# Patient Record
Sex: Male | Born: 1965 | Race: Black or African American | Hispanic: No | Marital: Married | Smoking: Never smoker
Health system: Southern US, Community
[De-identification: ages and names within clinical notes are randomized; demographics above are authoritative.]

## PROBLEM LIST (undated history)

## (undated) DIAGNOSIS — I5032 Chronic diastolic (congestive) heart failure: Secondary | ICD-10-CM

## (undated) DIAGNOSIS — T8859XA Other complications of anesthesia, initial encounter: Secondary | ICD-10-CM

## (undated) DIAGNOSIS — I272 Pulmonary hypertension, unspecified: Secondary | ICD-10-CM

## (undated) DIAGNOSIS — E785 Hyperlipidemia, unspecified: Secondary | ICD-10-CM

## (undated) DIAGNOSIS — I499 Cardiac arrhythmia, unspecified: Secondary | ICD-10-CM

## (undated) DIAGNOSIS — I480 Paroxysmal atrial fibrillation: Secondary | ICD-10-CM

## (undated) DIAGNOSIS — Z955 Presence of coronary angioplasty implant and graft: Secondary | ICD-10-CM

## (undated) DIAGNOSIS — N189 Chronic kidney disease, unspecified: Secondary | ICD-10-CM

## (undated) DIAGNOSIS — N186 End stage renal disease: Secondary | ICD-10-CM

## (undated) DIAGNOSIS — I50810 Right heart failure, unspecified: Secondary | ICD-10-CM

## (undated) DIAGNOSIS — I214 Non-ST elevation (NSTEMI) myocardial infarction: Secondary | ICD-10-CM

## (undated) DIAGNOSIS — K219 Gastro-esophageal reflux disease without esophagitis: Secondary | ICD-10-CM

## (undated) DIAGNOSIS — Z940 Kidney transplant status: Secondary | ICD-10-CM

## (undated) DIAGNOSIS — I10 Essential (primary) hypertension: Secondary | ICD-10-CM

## (undated) DIAGNOSIS — I4891 Unspecified atrial fibrillation: Secondary | ICD-10-CM

## (undated) DIAGNOSIS — G4730 Sleep apnea, unspecified: Secondary | ICD-10-CM

## (undated) DIAGNOSIS — I2510 Atherosclerotic heart disease of native coronary artery without angina pectoris: Secondary | ICD-10-CM

## (undated) DIAGNOSIS — J189 Pneumonia, unspecified organism: Secondary | ICD-10-CM

## (undated) DIAGNOSIS — I509 Heart failure, unspecified: Secondary | ICD-10-CM

## (undated) HISTORY — DX: Hyperlipidemia, unspecified: E78.5

## (undated) HISTORY — PX: HUMERUS SURGERY: SHX672

## (undated) HISTORY — PX: PARATHYROIDECTOMY: SHX19

## (undated) HISTORY — DX: Pneumonia, unspecified organism: J18.9

## (undated) HISTORY — DX: Sleep apnea, unspecified: G47.30

## (undated) HISTORY — PX: SMALL BOWEL ENTEROSCOPY: SHX2415

## (undated) HISTORY — PX: CHOLECYSTECTOMY: SHX55

## (undated) HISTORY — PX: CATARACT EXTRACTION: SUR2

## (undated) HISTORY — PX: ABLATION: SHX5711

## (undated) HISTORY — PX: HERNIA MESH REMOVAL: SHX1745

## (undated) HISTORY — PX: HIATAL HERNIA REPAIR: SHX195

## (undated) HISTORY — DX: Chronic kidney disease, unspecified: N18.9

---

## 1998-11-15 DIAGNOSIS — G839 Paralytic syndrome, unspecified: Secondary | ICD-10-CM

## 1998-11-15 HISTORY — DX: Paralytic syndrome, unspecified: G83.9

## 2003-11-16 DIAGNOSIS — Z940 Kidney transplant status: Secondary | ICD-10-CM

## 2003-11-16 HISTORY — DX: Kidney transplant status: Z94.0

## 2003-11-16 HISTORY — PX: TRANSPLANTATION RENAL: SUR1385

## 2016-11-15 ENCOUNTER — Inpatient Hospital Stay (HOSPITAL_COMMUNITY): Payer: Medicare Other

## 2016-11-15 ENCOUNTER — Encounter (HOSPITAL_COMMUNITY): Payer: Self-pay

## 2016-11-15 ENCOUNTER — Inpatient Hospital Stay (HOSPITAL_COMMUNITY)
Admission: EM | Admit: 2016-11-15 | Discharge: 2016-11-25 | DRG: 299 | Disposition: A | Discharge status: Home Health | Payer: Medicare Other | Attending: Internal Medicine | Admitting: Internal Medicine

## 2016-11-15 ENCOUNTER — Inpatient Hospital Stay (HOSPITAL_COMMUNITY)
Admit: 2016-11-15 | Discharge: 2016-11-15 | Disposition: A | Discharge status: Home / Self Care | Payer: Medicare Other | Attending: Internal Medicine | Admitting: Internal Medicine

## 2016-11-15 ENCOUNTER — Emergency Department (HOSPITAL_COMMUNITY): Payer: Medicare Other

## 2016-11-15 DIAGNOSIS — Z7952 Long term (current) use of systemic steroids: Secondary | ICD-10-CM | POA: Diagnosis not present

## 2016-11-15 DIAGNOSIS — I2510 Atherosclerotic heart disease of native coronary artery without angina pectoris: Secondary | ICD-10-CM | POA: Diagnosis present

## 2016-11-15 DIAGNOSIS — I482 Chronic atrial fibrillation: Secondary | ICD-10-CM | POA: Diagnosis present

## 2016-11-15 DIAGNOSIS — N184 Chronic kidney disease, stage 4 (severe): Secondary | ICD-10-CM | POA: Diagnosis present

## 2016-11-15 DIAGNOSIS — N189 Chronic kidney disease, unspecified: Secondary | ICD-10-CM

## 2016-11-15 DIAGNOSIS — E8889 Other specified metabolic disorders: Secondary | ICD-10-CM | POA: Diagnosis present

## 2016-11-15 DIAGNOSIS — Z992 Dependence on renal dialysis: Secondary | ICD-10-CM

## 2016-11-15 DIAGNOSIS — I248 Other forms of acute ischemic heart disease: Secondary | ICD-10-CM | POA: Diagnosis present

## 2016-11-15 DIAGNOSIS — D631 Anemia in chronic kidney disease: Secondary | ICD-10-CM | POA: Diagnosis present

## 2016-11-15 DIAGNOSIS — I82532 Chronic embolism and thrombosis of left popliteal vein: Secondary | ICD-10-CM | POA: Diagnosis present

## 2016-11-15 DIAGNOSIS — R262 Difficulty in walking, not elsewhere classified: Secondary | ICD-10-CM

## 2016-11-15 DIAGNOSIS — R509 Fever, unspecified: Secondary | ICD-10-CM | POA: Diagnosis not present

## 2016-11-15 DIAGNOSIS — I4891 Unspecified atrial fibrillation: Secondary | ICD-10-CM | POA: Diagnosis not present

## 2016-11-15 DIAGNOSIS — T79A29D Traumatic compartment syndrome of unspecified lower extremity, subsequent encounter: Secondary | ICD-10-CM

## 2016-11-15 DIAGNOSIS — N289 Disorder of kidney and ureter, unspecified: Secondary | ICD-10-CM | POA: Diagnosis not present

## 2016-11-15 DIAGNOSIS — I4892 Unspecified atrial flutter: Secondary | ICD-10-CM | POA: Diagnosis present

## 2016-11-15 DIAGNOSIS — M7989 Other specified soft tissue disorders: Secondary | ICD-10-CM

## 2016-11-15 DIAGNOSIS — L0390 Cellulitis, unspecified: Secondary | ICD-10-CM

## 2016-11-15 DIAGNOSIS — D500 Iron deficiency anemia secondary to blood loss (chronic): Secondary | ICD-10-CM

## 2016-11-15 DIAGNOSIS — N179 Acute kidney failure, unspecified: Secondary | ICD-10-CM

## 2016-11-15 DIAGNOSIS — R0902 Hypoxemia: Secondary | ICD-10-CM | POA: Diagnosis present

## 2016-11-15 DIAGNOSIS — Z7901 Long term (current) use of anticoagulants: Secondary | ICD-10-CM

## 2016-11-15 DIAGNOSIS — Y848 Other medical procedures as the cause of abnormal reaction of the patient, or of later complication, without mention of misadventure at the time of the procedure: Secondary | ICD-10-CM | POA: Diagnosis present

## 2016-11-15 DIAGNOSIS — A419 Sepsis, unspecified organism: Secondary | ICD-10-CM | POA: Diagnosis present

## 2016-11-15 DIAGNOSIS — L03116 Cellulitis of left lower limb: Secondary | ICD-10-CM | POA: Diagnosis present

## 2016-11-15 DIAGNOSIS — I5033 Acute on chronic diastolic (congestive) heart failure: Secondary | ICD-10-CM | POA: Diagnosis present

## 2016-11-15 DIAGNOSIS — E785 Hyperlipidemia, unspecified: Secondary | ICD-10-CM | POA: Diagnosis present

## 2016-11-15 DIAGNOSIS — N170 Acute kidney failure with tubular necrosis: Secondary | ICD-10-CM | POA: Diagnosis present

## 2016-11-15 DIAGNOSIS — Z881 Allergy status to other antibiotic agents status: Secondary | ICD-10-CM

## 2016-11-15 DIAGNOSIS — M79604 Pain in right leg: Secondary | ICD-10-CM

## 2016-11-15 DIAGNOSIS — T8619 Other complication of kidney transplant: Secondary | ICD-10-CM | POA: Diagnosis present

## 2016-11-15 DIAGNOSIS — N2581 Secondary hyperparathyroidism of renal origin: Secondary | ICD-10-CM | POA: Diagnosis present

## 2016-11-15 DIAGNOSIS — R609 Edema, unspecified: Secondary | ICD-10-CM

## 2016-11-15 DIAGNOSIS — S8012XA Contusion of left lower leg, initial encounter: Secondary | ICD-10-CM

## 2016-11-15 DIAGNOSIS — T79A0XA Compartment syndrome, unspecified, initial encounter: Secondary | ICD-10-CM

## 2016-11-15 DIAGNOSIS — Z949 Transplanted organ and tissue status, unspecified: Secondary | ICD-10-CM

## 2016-11-15 DIAGNOSIS — M79672 Pain in left foot: Secondary | ICD-10-CM

## 2016-11-15 DIAGNOSIS — I480 Paroxysmal atrial fibrillation: Secondary | ICD-10-CM

## 2016-11-15 DIAGNOSIS — I10 Essential (primary) hypertension: Secondary | ICD-10-CM | POA: Diagnosis not present

## 2016-11-15 DIAGNOSIS — I130 Hypertensive heart and chronic kidney disease with heart failure and stage 1 through stage 4 chronic kidney disease, or unspecified chronic kidney disease: Secondary | ICD-10-CM | POA: Diagnosis present

## 2016-11-15 DIAGNOSIS — Z79899 Other long term (current) drug therapy: Secondary | ICD-10-CM

## 2016-11-15 DIAGNOSIS — S8012XD Contusion of left lower leg, subsequent encounter: Secondary | ICD-10-CM | POA: Diagnosis not present

## 2016-11-15 DIAGNOSIS — M660 Rupture of popliteal cyst: Secondary | ICD-10-CM | POA: Diagnosis present

## 2016-11-15 DIAGNOSIS — D649 Anemia, unspecified: Secondary | ICD-10-CM

## 2016-11-15 HISTORY — DX: Atherosclerotic heart disease of native coronary artery without angina pectoris: I25.10

## 2016-11-15 HISTORY — DX: Essential (primary) hypertension: I10

## 2016-11-15 HISTORY — DX: Kidney transplant status: Z94.0

## 2016-11-15 HISTORY — DX: Heart failure, unspecified: I50.9

## 2016-11-15 HISTORY — DX: Unspecified atrial fibrillation: I48.91

## 2016-11-15 LAB — COMPREHENSIVE METABOLIC PANEL
ALBUMIN: 3.1 g/dL — AB (ref 3.5–5.0)
ALK PHOS: 93 U/L (ref 38–126)
ALT: 19 U/L (ref 17–63)
ALT: 21 U/L (ref 17–63)
ANION GAP: 7 (ref 5–15)
ANION GAP: 7 (ref 5–15)
AST: 36 U/L (ref 15–41)
AST: 38 U/L (ref 15–41)
Albumin: 3.4 g/dL — ABNORMAL LOW (ref 3.5–5.0)
Alkaline Phosphatase: 98 U/L (ref 38–126)
BILIRUBIN TOTAL: 2.5 mg/dL — AB (ref 0.3–1.2)
BILIRUBIN TOTAL: 1.8 mg/dL — AB (ref 0.3–1.2)
BUN: 23 mg/dL — ABNORMAL HIGH (ref 6–20)
BUN: 21 mg/dL — ABNORMAL HIGH (ref 6–20)
CALCIUM: 9.1 mg/dL (ref 8.9–10.3)
CALCIUM: 8.3 mg/dL — AB (ref 8.9–10.3)
CO2: 25 mmol/L (ref 22–32)
CO2: 22 mmol/L (ref 22–32)
CREATININE: 2.48 mg/dL — AB (ref 0.61–1.24)
Chloride: 106 mmol/L (ref 101–111)
Chloride: 107 mmol/L (ref 101–111)
Creatinine, Ser: 2.31 mg/dL — ABNORMAL HIGH (ref 0.61–1.24)
GFR calc non Af Amer: 29 mL/min — ABNORMAL LOW (ref >60)
GFR calc non Af Amer: 32 mL/min — ABNORMAL LOW (ref >60)
GFR, EST AFRICAN AMERICAN: 34 mL/min — AB (ref >60)
GFR, EST AFRICAN AMERICAN: 37 mL/min — AB (ref >60)
GLUCOSE: 118 mg/dL — AB (ref 65–99)
Glucose, Bld: 99 mg/dL (ref 65–99)
POTASSIUM: 4.2 mmol/L (ref 3.5–5.1)
Potassium: 4.1 mmol/L (ref 3.5–5.1)
SODIUM: 138 mmol/L (ref 135–145)
Sodium: 136 mmol/L (ref 135–145)
TOTAL PROTEIN: 6.9 g/dL (ref 6.5–8.1)
TOTAL PROTEIN: 6.4 g/dL — AB (ref 6.5–8.1)

## 2016-11-15 LAB — CBC
HEMATOCRIT: 33.7 % — AB (ref 39.0–52.0)
HEMOGLOBIN: 10.2 g/dL — AB (ref 13.0–17.0)
MCH: 27.8 pg (ref 26.0–34.0)
MCHC: 30.3 g/dL (ref 30.0–36.0)
MCV: 91.8 fL (ref 78.0–100.0)
Platelets: 188 10*3/uL (ref 150–400)
RBC: 3.67 MIL/uL — ABNORMAL LOW (ref 4.22–5.81)
RDW: 16.8 % — ABNORMAL HIGH (ref 11.5–15.5)
WBC: 12.3 10*3/uL — ABNORMAL HIGH (ref 4.0–10.5)

## 2016-11-15 LAB — CBC WITH DIFFERENTIAL/PLATELET
BASOS ABS: 0 10*3/uL (ref 0.0–0.1)
BASOS PCT: 0 %
EOS ABS: 0.4 10*3/uL (ref 0.0–0.7)
Eosinophils Relative: 3 %
HEMATOCRIT: 38 % — AB (ref 39.0–52.0)
HEMOGLOBIN: 11.3 g/dL — AB (ref 13.0–17.0)
Lymphocytes Relative: 13 %
Lymphs Abs: 1.7 10*3/uL (ref 0.7–4.0)
MCH: 27.3 pg (ref 26.0–34.0)
MCHC: 29.7 g/dL — AB (ref 30.0–36.0)
MCV: 91.8 fL (ref 78.0–100.0)
Monocytes Absolute: 1.7 10*3/uL — ABNORMAL HIGH (ref 0.1–1.0)
Monocytes Relative: 13 %
NEUTROS ABS: 9.3 10*3/uL — AB (ref 1.7–7.7)
NEUTROS PCT: 71 %
Platelets: 205 10*3/uL (ref 150–400)
RBC: 4.14 MIL/uL — ABNORMAL LOW (ref 4.22–5.81)
RDW: 16.7 % — AB (ref 11.5–15.5)
WBC: 13.1 10*3/uL — ABNORMAL HIGH (ref 4.0–10.5)

## 2016-11-15 LAB — PROTIME-INR
INR: 2.37
INR: 2.12
PROTHROMBIN TIME: 26.3 s — AB (ref 11.4–15.2)
PROTHROMBIN TIME: 24.1 s — AB (ref 11.4–15.2)

## 2016-11-15 LAB — URINALYSIS, ROUTINE W REFLEX MICROSCOPIC
BILIRUBIN URINE: NEGATIVE
Glucose, UA: NEGATIVE mg/dL
Ketones, ur: NEGATIVE mg/dL
Leukocytes, UA: NEGATIVE
NITRITE: NEGATIVE
PROTEIN: 30 mg/dL — AB
SPECIFIC GRAVITY, URINE: 1.015 (ref 1.005–1.030)
pH: 8 (ref 5.0–8.0)

## 2016-11-15 LAB — TSH
TSH: 1.184 u[IU]/mL (ref 0.350–4.500)
TSH: 0.871 u[IU]/mL (ref 0.350–4.500)

## 2016-11-15 LAB — TROPONIN I
Troponin I: 0.09 ng/mL (ref <0.03)
Troponin I: 0.09 ng/mL (ref <0.03)

## 2016-11-15 LAB — INFLUENZA PANEL BY PCR (TYPE A & B)
INFLAPCR: NEGATIVE
Influenza B By PCR: NEGATIVE

## 2016-11-15 LAB — I-STAT CG4 LACTIC ACID, ED: Lactic Acid, Venous: 1.76 mmol/L (ref 0.5–1.9)

## 2016-11-15 LAB — MRSA PCR SCREENING: MRSA BY PCR: NEGATIVE

## 2016-11-15 MED ORDER — AMIODARONE HCL IN DEXTROSE 360-4.14 MG/200ML-% IV SOLN
30.0000 mg/h | INTRAVENOUS | Status: DC
  Filled 2016-11-15: qty 200

## 2016-11-15 MED ORDER — DOXYCYCLINE HYCLATE 100 MG IV SOLR
INTRAVENOUS | Status: AC
  Filled 2016-11-15: qty 100

## 2016-11-15 MED ORDER — METOPROLOL SUCCINATE ER 50 MG PO TB24
50.0000 mg | ORAL_TABLET | Freq: Every day | ORAL | Status: DC
  Administered 2016-11-15: 50 mg via ORAL
  Filled 2016-11-15 (×4): qty 1

## 2016-11-15 MED ORDER — DOXYCYCLINE HYCLATE 100 MG IV SOLR
100.0000 mg | Freq: Two times a day (BID) | INTRAVENOUS | Status: DC
  Administered 2016-11-15 – 2016-11-19 (×8): 100 mg via INTRAVENOUS
  Filled 2016-11-15 (×14): qty 100

## 2016-11-15 MED ORDER — TAMSULOSIN HCL 0.4 MG PO CAPS
0.4000 mg | ORAL_CAPSULE | Freq: Every day | ORAL | Status: DC
  Administered 2016-11-16 – 2016-11-25 (×10): 0.4 mg via ORAL
  Filled 2016-11-15 (×11): qty 1

## 2016-11-15 MED ORDER — HEPARIN (PORCINE) IN NACL 100-0.45 UNIT/ML-% IJ SOLN
2000.0000 [IU]/h | INTRAMUSCULAR | Status: DC
Start: 2016-11-15 — End: 2016-11-20
  Administered 2016-11-15: 1600 [IU]/h via INTRAVENOUS
  Administered 2016-11-16: 1850 [IU]/h via INTRAVENOUS
  Administered 2016-11-17: 2000 [IU]/h via INTRAVENOUS
  Administered 2016-11-17: 2200 [IU]/h via INTRAVENOUS
  Administered 2016-11-18 – 2016-11-20 (×4): 2000 [IU]/h via INTRAVENOUS
  Filled 2016-11-15 (×9): qty 250

## 2016-11-15 MED ORDER — ACETAMINOPHEN 500 MG PO TABS
500.0000 mg | ORAL_TABLET | Freq: Once | ORAL | Status: AC
  Administered 2016-11-15: 500 mg via ORAL
  Filled 2016-11-15: qty 1

## 2016-11-15 MED ORDER — FUROSEMIDE 40 MG PO TABS
40.0000 mg | ORAL_TABLET | Freq: Every day | ORAL | Status: DC
Start: 2016-11-15 — End: 2016-11-21
  Administered 2016-11-16 – 2016-11-20 (×5): 40 mg via ORAL
  Filled 2016-11-15 (×7): qty 1

## 2016-11-15 MED ORDER — MORPHINE SULFATE (PF) 4 MG/ML IV SOLN
4.0000 mg | Freq: Once | INTRAVENOUS | Status: AC
  Administered 2016-11-15: 4 mg via INTRAVENOUS
  Filled 2016-11-15: qty 1

## 2016-11-15 MED ORDER — ACETAMINOPHEN 325 MG PO TABS
650.0000 mg | ORAL_TABLET | Freq: Four times a day (QID) | ORAL | Status: DC | PRN
  Administered 2016-11-15 – 2016-11-25 (×16): 650 mg via ORAL
  Filled 2016-11-15 (×17): qty 2

## 2016-11-15 MED ORDER — DILTIAZEM HCL 25 MG/5ML IV SOLN
10.0000 mg | Freq: Once | INTRAVENOUS | Status: AC
  Administered 2016-11-15: 10 mg via INTRAVENOUS
  Filled 2016-11-15: qty 5

## 2016-11-15 MED ORDER — PANTOPRAZOLE SODIUM 40 MG PO TBEC
40.0000 mg | DELAYED_RELEASE_TABLET | Freq: Every day | ORAL | Status: DC
Start: 2016-11-15 — End: 2016-11-25
  Administered 2016-11-15 – 2016-11-25 (×11): 40 mg via ORAL
  Filled 2016-11-15 (×11): qty 1

## 2016-11-15 MED ORDER — SODIUM CHLORIDE 0.9 % IV BOLUS (SEPSIS)
1000.0000 mL | Freq: Once | INTRAVENOUS | Status: AC
  Administered 2016-11-15: 1000 mL via INTRAVENOUS

## 2016-11-15 MED ORDER — ACETAMINOPHEN 500 MG PO TABS: 1000.0000 mg | ORAL_TABLET | Freq: Once | ORAL | Status: DC

## 2016-11-15 MED ORDER — DOXYCYCLINE HYCLATE 100 MG IV SOLR
100.0000 mg | Freq: Once | INTRAVENOUS | Status: AC
  Administered 2016-11-15: 100 mg via INTRAVENOUS
  Filled 2016-11-15: qty 100

## 2016-11-15 MED ORDER — PIPERACILLIN-TAZOBACTAM 3.375 G IVPB
3.3750 g | Freq: Three times a day (TID) | INTRAVENOUS | Status: DC
  Administered 2016-11-15 – 2016-11-20 (×15): 3.375 g via INTRAVENOUS
  Filled 2016-11-15 (×15): qty 50

## 2016-11-15 MED ORDER — TACROLIMUS 1 MG PO CAPS
1.0000 mg | ORAL_CAPSULE | Freq: Two times a day (BID) | ORAL | Status: DC
  Administered 2016-11-15 – 2016-11-25 (×21): 1 mg via ORAL
  Filled 2016-11-15 (×24): qty 1

## 2016-11-15 MED ORDER — FERROUS SULFATE 325 (65 FE) MG PO TABS
325.0000 mg | ORAL_TABLET | Freq: Every day | ORAL | Status: DC
  Administered 2016-11-16 – 2016-11-21 (×6): 325 mg via ORAL
  Filled 2016-11-15 (×6): qty 1

## 2016-11-15 MED ORDER — VITAMIN K1 10 MG/ML IJ SOLN
2.0000 mg | Freq: Once | INTRAVENOUS | Status: AC
  Administered 2016-11-15: 2 mg via INTRAVENOUS
  Filled 2016-11-15: qty 0.2

## 2016-11-15 MED ORDER — WARFARIN SODIUM 5 MG PO TABS: 7.0000 mg | ORAL_TABLET | Freq: Once | ORAL | Status: DC

## 2016-11-15 MED ORDER — MAGNESIUM OXIDE 400 MG PO TABS
200.0000 mg | ORAL_TABLET | Freq: Every day | ORAL | Status: DC
  Administered 2016-11-15 – 2016-11-25 (×11): 200 mg via ORAL
  Filled 2016-11-15: qty 0.5
  Filled 2016-11-15 (×2): qty 1
  Filled 2016-11-15: qty 0.5
  Filled 2016-11-15 (×3): qty 1
  Filled 2016-11-15 (×3): qty 0.5
  Filled 2016-11-15: qty 1
  Filled 2016-11-15 (×3): qty 0.5
  Filled 2016-11-15 (×2): qty 1
  Filled 2016-11-15: qty 0.5
  Filled 2016-11-15 (×2): qty 1
  Filled 2016-11-15 (×3): qty 0.5

## 2016-11-15 MED ORDER — METOPROLOL SUCCINATE ER 25 MG PO TB24: 25.0000 mg | ORAL_TABLET | Freq: Every day | ORAL | Status: DC

## 2016-11-15 MED ORDER — ADULT MULTIVITAMIN W/MINERALS CH
1.0000 | ORAL_TABLET | Freq: Every day | ORAL | Status: DC
  Administered 2016-11-15 – 2016-11-25 (×11): 1 via ORAL
  Filled 2016-11-15 (×11): qty 1

## 2016-11-15 MED ORDER — AMIODARONE HCL IN DEXTROSE 360-4.14 MG/200ML-% IV SOLN
60.0000 mg/h | INTRAVENOUS | Status: AC
  Administered 2016-11-15 (×2): 60 mg/h via INTRAVENOUS
  Filled 2016-11-15: qty 200

## 2016-11-15 MED ORDER — ASPIRIN 81 MG PO CHEW
81.0000 mg | CHEWABLE_TABLET | Freq: Every day | ORAL | Status: DC
  Administered 2016-11-15 – 2016-11-25 (×11): 81 mg via ORAL
  Filled 2016-11-15 (×11): qty 1

## 2016-11-15 MED ORDER — WARFARIN - PHARMACIST DOSING INPATIENT: Status: DC

## 2016-11-15 MED ORDER — ACETAMINOPHEN 500 MG PO TABS
1000.0000 mg | ORAL_TABLET | Freq: Once | ORAL | Status: AC
  Administered 2016-11-15: 1000 mg via ORAL
  Filled 2016-11-15: qty 2

## 2016-11-15 MED ORDER — MULTI-VITAMIN/MINERALS PO TABS
1.0000 | ORAL_TABLET | Freq: Every day | ORAL | Status: DC
  Filled 2016-11-15 (×3): qty 1

## 2016-11-15 MED ORDER — HYDROCODONE-ACETAMINOPHEN 5-325 MG PO TABS
1.0000 | ORAL_TABLET | Freq: Four times a day (QID) | ORAL | Status: DC | PRN
  Administered 2016-11-15 – 2016-11-19 (×9): 1 via ORAL
  Filled 2016-11-15 (×9): qty 1

## 2016-11-15 MED ORDER — CALCITRIOL 0.25 MCG PO CAPS
0.5000 ug | ORAL_CAPSULE | Freq: Every day | ORAL | Status: DC
  Administered 2016-11-15 – 2016-11-25 (×11): 0.5 ug via ORAL
  Filled 2016-11-15 (×10): qty 2

## 2016-11-15 MED ORDER — PIPERACILLIN-TAZOBACTAM 3.375 G IVPB
3.3750 g | Freq: Once | INTRAVENOUS | Status: AC
  Administered 2016-11-15: 3.375 g via INTRAVENOUS
  Filled 2016-11-15: qty 50

## 2016-11-15 MED ORDER — AMIODARONE LOAD VIA INFUSION
150.0000 mg | Freq: Once | INTRAVENOUS | Status: AC
  Administered 2016-11-15: 150 mg via INTRAVENOUS
  Filled 2016-11-15: qty 83.34

## 2016-11-15 MED ORDER — PREDNISONE 10 MG PO TABS
10.0000 mg | ORAL_TABLET | Freq: Every day | ORAL | Status: DC
  Administered 2016-11-16 – 2016-11-25 (×10): 10 mg via ORAL
  Filled 2016-11-15 (×10): qty 1

## 2016-11-15 MED ORDER — AMIODARONE HCL IN DEXTROSE 360-4.14 MG/200ML-% IV SOLN
INTRAVENOUS | Status: AC
  Filled 2016-11-15: qty 200

## 2016-11-15 MED ORDER — DILTIAZEM HCL 100 MG IV SOLR
5.0000 mg/h | INTRAVENOUS | Status: DC
  Administered 2016-11-15: 5 mg/h via INTRAVENOUS
  Administered 2016-11-15: 15 mg/h via INTRAVENOUS
  Administered 2016-11-16: 10 mg/h via INTRAVENOUS
  Filled 2016-11-15 (×5): qty 100

## 2016-11-15 MED ORDER — ATORVASTATIN CALCIUM 40 MG PO TABS
80.0000 mg | ORAL_TABLET | Freq: Every day | ORAL | Status: DC
  Administered 2016-11-15 – 2016-11-24 (×11): 80 mg via ORAL
  Filled 2016-11-15 (×13): qty 2

## 2016-11-15 NOTE — ED Notes (Signed)
Pt in [place] upon receiving report, awaiting return to assess pt.

## 2016-11-15 NOTE — ED Triage Notes (Signed)
Pt reports chills and fever x 1 day, is taking hydrocodone for his right calf that is swollen and has been seen by his pmd to rule out blood clot.

## 2016-11-15 NOTE — Progress Notes (Addendum)
Pharmacy Antibiotic Note  [name] is a 51 y.o. male admitted on 11/15/2016 with deep tissue abscess.  Pharmacy has been consulted for ZOSYN dosing.  Pt noted to have allergy to Vancomycin.  Also asked to initiate Heparin for VTE treatment, no bolus.  Pt did receive Vitamin K 2mg  in ED.  INR therapeutic on admission.  Plan: Zosyn 3.375g IV q8h (4 hour infusion).  Monitor progress, labs, c/s Heparin infusion at 1600 units/hr (no bolus) Heparin level daily  Height: 6\' 2"  (188 cm) Weight: 275 lb (124.7 kg) IBW/kg (Calculated) : 82.2  Temp (24hrs), Avg:100.2 F (37.9 C), Min:99.1 F (37.3 C), Max:101.5 F (38.6 C)   Recent Labs Lab 11/15/16 0205 11/15/16 0343 11/15/16 1526  WBC 13.1*  --  12.3*  CREATININE 2.48*  --   --   LATICACIDVEN  --  1.76  --    INR Last Three Days: Recent Labs     11/15/16  0205  INR  2.37   Estimated Creatinine Clearance: 51.1 mL/min (by C-G formula based on SCr of 2.48 mg/dL (H)).    Allergies  Allergen Reactions  . Vancomycin Swelling   Antimicrobials this admission: Zosyn 1/1 >>   Dose adjustments this admission:  Microbiology results:  BCx: pending  UCx: pending   Sputum:    MRSA PCR:   Thank you for allowing pharmacy to be a part of this patient's care.  Hart Robinsons A 11/15/2016 4:03 PM

## 2016-11-15 NOTE — ED Notes (Signed)
Left calf 45cm.

## 2016-11-15 NOTE — ED Notes (Signed)
Pt transported to US

## 2016-11-15 NOTE — ED Notes (Signed)
RN called pharmacy to request toprol xl. 2nd call.

## 2016-11-15 NOTE — ED Provider Notes (Addendum)
CHIEF COMPLAINT: Fever, urinary frequency, left leg pain and swelling  HPI: Pt is a 51 y.o. male with history of hypertension, CAD, kidney transplant on prednisone and Prograf, atrial fibrillation on Coumadin who presents to the emergency department with complaints of fever that started today. Fever at home was 102. States he has had urosepsis many times in the past.  States he has previously been admitted to hospitals in [place]. Denies any headache, neck pain or neck stiffness, chest pain or shortness of breath, cough, vomiting or diarrhea. Has noticed that he has been urinating more today than normal but denies dysuria or hematuria. States that last week he noticed pain and swelling to his left lower extremity. Was at [facility] and had a venous ultrasound that ruled out DVT but showed that he had a hematoma. States this area continues to be very painful to him and the skin is discolored. Denies any injury to this area. States he did have a flu shot this year. Reports his kidney function is normally around 2.7.  ROS: See HPI Constitutional:  fever  Eyes: no drainage  ENT: no runny nose   Cardiovascular:  no chest pain  Resp: no SOB  GI: no vomiting GU: no dysuria Integumentary: no rash  Allergy: no hives  Musculoskeletal: no leg swelling  Neurological: no slurred speech ROS otherwise negative  PAST MEDICAL HISTORY/PAST SURGICAL HISTORY:  Past Medical History:  Diagnosis Date  . CHF (congestive heart failure) ([place])   . Coronary artery disease   . Hypertension   . Kidney transplanted     MEDICATIONS:  Prior to Admission medications   Medication Sig Start Date End Date Taking? Authorizing Provider  atorvastatin (LIPITOR) 80 MG tablet Take 80 mg by mouth daily.   Yes Historical Provider, MD  calcitRIOL (ROCALTROL) 0.5 MCG capsule Take 0.5 mcg by mouth daily.   Yes Historical Provider, MD  calcium carbonate 1250 MG capsule Take 1,800 mg by mouth 2  (two) times daily with a meal.   Yes Historical Provider, MD  ferrous sulfate 325 (65 FE) MG tablet Take 325 mg by mouth daily with breakfast.   Yes Historical Provider, MD  furosemide (LASIX) 40 MG tablet Take 40 mg by mouth.   Yes Historical Provider, MD  magnesium oxide (MAG-OX) 400 MG tablet Take 200 mg by mouth daily.   Yes Historical Provider, MD  metoprolol succinate (TOPROL-XL) 25 MG 24 hr tablet Take 25 mg by mouth daily.   Yes Historical Provider, MD  Multiple Vitamins-Minerals (MULTIVITAMIN WITH MINERALS) tablet Take 1 tablet by mouth daily.   Yes Historical Provider, MD  omeprazole (PRILOSEC) 40 MG capsule Take 40 mg by mouth daily.   Yes Historical Provider, MD  predniSONE (DELTASONE) 10 MG tablet Take 10 mg by mouth daily with breakfast.   Yes Historical Provider, MD  tacrolimus (PROGRAF) 1 MG capsule Take 1 mg by mouth 2 (two) times daily.   Yes Historical Provider, MD  tamsulosin (FLOMAX) 0.4 MG CAPS capsule Take 0.4 mg by mouth.   Yes Historical Provider, MD  warfarin (COUMADIN) 6 MG tablet Take 6 mg by mouth daily. 6 mg Sat/Sun,  7 mg Mon-Friday   Yes Historical Provider, MD    ALLERGIES:  Allergies  Allergen Reactions  . Vancomycin Swelling    SOCIAL HISTORY:  Social History  Substance Use Topics  . Smoking status: Never Smoker  . Smokeless tobacco: Never Used  . Alcohol use No    FAMILY HISTORY: No  family history on file.  EXAM: BP 144/71 (BP Location: Right Arm)   Pulse (!) 137   Temp 101.5 F (38.6 C) (Rectal)   Resp 20   Ht 6\' 2"  (1.88 m)   Wt 275 lb (124.7 kg)   SpO2 97%   BMI 35.31 kg/m  CONSTITUTIONAL: Alert and oriented and responds appropriately to questions. Well-appearing; well-nourished, Afebrile HEAD: Normocephalic EYES: Conjunctivae clear, PERRL, EOMI ENT: normal nose; no rhinorrhea; moist mucous membranes NECK: Supple, no meningismus, no nuchal rigidity, no LAD  CARD: Irregularly irregular and tachycardic; S1 and S2 appreciated; no  murmurs, no clicks, no rubs, no gallops RESP: Normal chest excursion without splinting or tachypnea; breath sounds clear and equal bilaterally; no wheezes, no rhonchi, no rales, no hypoxia or respiratory distress, speaking full sentences ABD/GI: Normal bowel sounds; non-distended; soft, non-tender, no rebound, no guarding, no peritoneal signs, no hepatosplenomegaly BACK:  The back appears normal and is non-tender to palpation, there is no CVA tenderness EXT: Patient's left calf is swollen, tender to palpation with discoloration of the skin but no warmth. Compartments are soft. 2+ DP pulses bilaterally. No bony deformity. No joint effusion. Normal ROM in all joints; otherwise extremities are non-tender to palpation; no edema; normal capillary refill; no cyanosis, no calf tenderness or swelling    SKIN: Normal color for age and race; warm; no rash NEURO: Moves all extremities equally, sensation to light touch intact diffusely, cranial nerves II through XII intact, normal speech PSYCH: The patient's mood and manner are appropriate. Grooming and personal hygiene are appropriate.  MEDICAL DECISION MAKING: Patient here with fever, tachycardia. Concern for sepsis in an immunocompromised patient. He is hemodynamically stable however. Will check labs, cultures, urine. He is not having any upper respiratory symptoms. No chest pain or shortness of breath.  I do not want to start diltiazem drip at this time as he needs SIRS criteria. We'll try to treat his tachycardia with IV fluids, Tylenol.  ED PROGRESS: Patient's urine shows blood and rare bacteria but no obvious sign of infection. Chest x-ray and flu swab have been added on and are negative. Lactate normal. He is still normotensive but tachycardic. He has received his 30 mL/kg of IV fluids. Discussed with pharmacy who recommends getting patient IV Zosyn and IV doxycycline given he is allergic to vancomycin.  Outside records from [facility]  confirm that patient has a hematoma to the left leg and no DVT. This does not appear to be obvious cellulitis but could be the source of his infection. We'll discuss with medicine for admission.   5:00 AM  D/w Dr. Maudie Mercury With hospitalist service. He agrees on admission to inpatient, stepdown. Patient updated with plan.  Given no significant improvement in heart rate after IV hydration and improvement of fever, will give diltiazem bolus. Patient may need diltiazem drip while hospitalized.   EKG Interpretation  Date/Time:  Monday November 15 2016 03:26:35 EST Ventricular Rate:  139 PR Interval:    QRS Duration: 82 QT Interval:  317 QTC Calculation: 482 R Axis:   138 Text Interpretation:  Sinus or ectopic atrial tachycardia Multiform ventricular premature complexes Right axis deviation Probable anterolateral infarct, old No old tracing to compare Confirmed by Ethelreda Sukhu,  DO, Xuan Mateus ([id number]) on 11/15/2016 3:35:19 AM         CRITICAL CARE Performed by: Nyra Jabs   Total critical care time: 45 minutes  Critical care time was exclusive of separately billable procedures and treating other patients.  Critical care was necessary to treat or prevent imminent or life-threatening deterioration.  Critical care was time spent personally by me on the following activities: development of treatment plan with patient and/or surrogate as well as nursing, discussions with consultants, evaluation of patient's response to treatment, examination of patient, obtaining history from patient or surrogate, ordering and performing treatments and interventions, ordering and review of laboratory studies, ordering and review of radiographic studies, pulse oximetry and re-evaluation of patient's condition.    [place], DO 11/15/16 [place], DO 11/15/16 [id number]

## 2016-11-15 NOTE — ED Notes (Addendum)
Heartrate 130's. Caredizem at max dose of 15mg /hr, bp 110/72. Toprol Xl given, delay in receiving medication from pharmacy. Hospitalist notified.

## 2016-11-15 NOTE — ED Notes (Signed)
RN called pharmacy for daily medications (prograf, toprol). Currently holding for stepdown room with unknown wait time.
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
## 2016-11-15 NOTE — Progress Notes (Signed)
1615: Dr. Candiss Norse made aware troponin 0.09. New order for ASA received. Given as ordered.  1733: HR noted to be sustained 130-140. New order received to start amiodarone.  Per MD okay to give additional dose of ordered PRN tylenol for temperature 100.7.

## 2016-11-15 NOTE — Progress Notes (Signed)
Calf circumference 47 cm at 1800

## 2016-11-15 NOTE — H&P (Addendum)
TRH H&P   Patient Demographics:    [name], is a 51 y.o. male  MRN: [id number]   DOB - 1966/04/19  Admit Date - 11/15/2016  Outpatient Primary MD for the patient is No PCP Per Patient,  Pcp: Dr. Manuella Ghazi, [facility] , [place])   Referring MD/NP/PA: Dr. Leonides Schanz  Outpatient Specialists:     Patient coming from: home  Chief Complaint  Patient presents with  . Fever      HPI:    [name]  is a 51 y.o. male, w Atrial fibrillation , Hypertension, CHF ? EF, CAD, renal transplant who presented with c/o fever x1 day,  Pt notes that his left leg been swollen x4 days.  Pt had evaluation in ED, at [facility].  Per pt no blood clot.  Pt sent home.  Presents today due to fever.  Denies sore throat, ear ache, cough, cp, palp, sob.   In ED, pt felt to have cellulitis by ED of the left distal lower ext.  CXR negative.  Urinalysis negative.  Source of fever appears to be cellulitis.  Pt will be admitted for Afib with RVR and cellulitis.     Review of systems:    In addition to the HPI above,   No Headache, No changes with Vision or hearing, No problems swallowing food or Liquids, No Chest pain, Cough or Shortness of Breath, No Abdominal pain, No Nausea or Vommitting, Bowel movements are regular, No Blood in stool or Urine, No dysuria,  No new joints pains-aches,  No new weakness, tingling, numbness in any extremity, No recent weight gain or loss, No polyuria, polydypsia or polyphagia, No significant Mental Stressors.  A full 10 point Review of Systems was done, except as stated above, all other Review of Systems were negative.   With Past History of the following :    Past Medical History:  Diagnosis Date  . Atrial fibrillation ([place])   . CHF (congestive heart failure) ([place])   . Coronary artery disease   . Hypertension   . Kidney transplanted       Past Surgical  History:  Procedure Laterality Date  . ABLATION    . CHOLECYSTECTOMY    . TRANSPLANTATION RENAL  2005      Social History:     Social History  Substance Use Topics  . Smoking status: Never Smoker  . Smokeless tobacco: Never Used  . Alcohol use No     Lives - at home  Mobility -  Able to walk by self typically.    Family History :     Family History  Problem Relation Age of Onset  . Hypertension Mother   . Heart attack Father       Home Medications:   Prior to Admission medications   Medication Sig Start Date End Date Taking? Authorizing Provider  atorvastatin (LIPITOR) 80 MG tablet Take  80 mg by mouth daily.   Yes Historical Provider, MD  calcitRIOL (ROCALTROL) 0.5 MCG capsule Take 0.5 mcg by mouth daily.   Yes Historical Provider, MD  calcium carbonate 1250 MG capsule Take 1,800 mg by mouth 2 (two) times daily with a meal.   Yes Historical Provider, MD  ferrous sulfate 325 (65 FE) MG tablet Take 325 mg by mouth daily with breakfast.   Yes Historical Provider, MD  furosemide (LASIX) 40 MG tablet Take 40 mg by mouth.   Yes Historical Provider, MD  magnesium oxide (MAG-OX) 400 MG tablet Take 200 mg by mouth daily.   Yes Historical Provider, MD  metoprolol succinate (TOPROL-XL) 25 MG 24 hr tablet Take 25 mg by mouth daily.   Yes Historical Provider, MD  Multiple Vitamins-Minerals (MULTIVITAMIN WITH MINERALS) tablet Take 1 tablet by mouth daily.   Yes Historical Provider, MD  omeprazole (PRILOSEC) 40 MG capsule Take 40 mg by mouth daily.   Yes Historical Provider, MD  predniSONE (DELTASONE) 10 MG tablet Take 10 mg by mouth daily with breakfast.   Yes Historical Provider, MD  tacrolimus (PROGRAF) 1 MG capsule Take 1 mg by mouth 2 (two) times daily.   Yes Historical Provider, MD  tamsulosin (FLOMAX) 0.4 MG CAPS capsule Take 0.4 mg by mouth.   Yes Historical Provider, MD  warfarin (COUMADIN) 6 MG tablet Take 6 mg by mouth daily. 6 mg Sat/Sun,  7 mg Mon-Friday   Yes  Historical Provider, MD     Allergies:     Allergies  Allergen Reactions  . Vancomycin Swelling     Physical Exam:   Vitals  Blood pressure 120/75, pulse (!) 141, temperature 101.5 F (38.6 C), temperature source Rectal, resp. rate 20, height 6\' 2"  (1.88 m), weight 124.7 kg (275 lb), SpO2 92 %.   1. General  lying in bed in NAD,    2. Normal affect and insight, Not Suicidal or Homicidal, Awake Alert, Oriented X 3.  3. No F.N deficits, ALL C.Nerves Intact, Strength 5/5 all 4 extremities, Sensation intact all 4 extremities, Plantars down going.  4. Ears and Eyes appear Normal, Conjunctivae clear, PERRLA. Moist Oral Mucosa.  5. Supple Neck, No JVD, No cervical lymphadenopathy appriciated, No Carotid Bruits.  6. Symmetrical Chest wall movement, Good air movement bilaterally, CTAB.  7. Irr, irr, s1, s2 , No Gallops, Rubs or Murmurs, No Parasternal Heave.  8. Positive Bowel Sounds, Abdomen Soft, No tenderness, No organomegaly appriciated,No rebound -guarding or rigidity.  9.  No Cyanosis, Normal Skin Turgor, No Skin Rash or Bruise.  10. Good muscle tone,  joints appear normal , no effusions, Normal ROM.  11. No Palpable Lymph Nodes in Neck or Axillae + edema left distal lower ext,  + warmth, slight redness.    Data Review:    CBC  Recent Labs Lab 11/15/16 0205  WBC 13.1*  HGB 11.3*  HCT 38.0*  PLT 205  MCV 91.8  MCH 27.3  MCHC 29.7*  RDW 16.7*  LYMPHSABS 1.7  MONOABS 1.7*  EOSABS 0.4  BASOSABS 0.0   ------------------------------------------------------------------------------------------------------------------  Chemistries   Recent Labs Lab 11/15/16 0205  NA 138  K 4.1  CL 106  CO2 25  GLUCOSE 99  BUN 23*  CREATININE 2.48*  CALCIUM 9.1  AST 36  ALT 19  ALKPHOS 93  BILITOT 2.5*   ------------------------------------------------------------------------------------------------------------------ estimated creatinine clearance is 51.1 mL/min  (by C-G formula based on SCr of 2.48 mg/dL (H)). ------------------------------------------------------------------------------------------------------------------ No results for input(s): TSH,  T4TOTAL, T3FREE, THYROIDAB in the last 72 hours.  Invalid input(s): FREET3  Coagulation profile  Recent Labs Lab 11/15/16 0205  INR 2.37   ------------------------------------------------------------------------------------------------------------------- No results for input(s): DDIMER in the last 72 hours. -------------------------------------------------------------------------------------------------------------------  Cardiac Enzymes No results for input(s): CKMB, TROPONINI, MYOGLOBIN in the last 168 hours.  Invalid input(s): CK ------------------------------------------------------------------------------------------------------------------ No results found for: BNP   ---------------------------------------------------------------------------------------------------------------  Urinalysis    Component Value Date/Time   COLORURINE YELLOW 11/15/2016 0330   APPEARANCEUR CLEAR 11/15/2016 0330   LABSPEC 1.015 11/15/2016 0330   PHURINE 8.0 11/15/2016 0330   GLUCOSEU NEGATIVE 11/15/2016 0330   HGBUR SMALL (A) 11/15/2016 0330   BILIRUBINUR NEGATIVE 11/15/2016 0330   KETONESUR NEGATIVE 11/15/2016 0330   PROTEINUR 30 (A) 11/15/2016 0330   NITRITE NEGATIVE 11/15/2016 0330   LEUKOCYTESUR NEGATIVE 11/15/2016 0330    ----------------------------------------------------------------------------------------------------------------   Imaging Results:    Dg Chest 2 View  Result Date: 11/15/2016 CLINICAL DATA:  Fever and chills, onset yesterday EXAM: CHEST  2 VIEW COMPARISON:  None. FINDINGS: Numerous metallic fragments scattered throughout the anterior right chest wall. Pleural fluid or thickening in the right hemithorax. Curvilinear scarring or atelectasis in the right base. The pleural  thickening and the curvilinear opacities could be chronic and related to the prior trauma, but no prior imaging is available to document chronicity. The left lung is clear.  There is moderate cardiomegaly. IMPRESSION: 1. Numerous metallic fragments throughout the anterior right chest wall as well as right pleural thickening and curvilinear right base opacities. This may all be chronic. 2. No confluent consolidation. 3. Moderate cardiomegaly. Electronically Signed   By: Andreas Newport M.D.   On: 11/15/2016 04:31       Assessment & Plan:    Active Problems:   Atrial fibrillation with RVR (HCC)   Renal insufficiency   Anemia   Atrial fibrillation with rapid ventricular response (HCC)   Cellulitis    1. Afib with RVR Tele Trop I q6h x3 Check tsh Check cardiac echo cardizem gtt, titrate to keep hr 65-100 Coumadin pharmacy to dose  2. Cellulitis Zosyn iv pharmacy to dose  3. Fever secondary to #1, possible early sepsis Blood culture x2 pending  4. Left calf hematoma? Please obtain records from [facility]  If unable to obtain may be useful to repaet ultrasound  5. Anemia Check cbc in am  6. Renal insufficiency Check cmp in am  7. Renal transplant Cont current medication  8. Suspect OSA Please have pcp evaluate as outpatient thanks   DVT Prophylaxis  coumadin  AM Labs Ordered, also please review Full Orders  Family Communication: Admission, patients condition and plan of care including tests being ordered have been discussed with the patient  who indicate understanding and agree with the plan and Code Status.  Code Status FULL CODE  Likely DC to  home  Condition GUARDED    Consults called:   Admission status: inpatient   Time spent in minutes : 50 minutes, critical care for Afib with RVR   Jani Gravel M.D on 11/15/2016 at 5:15 AM  Between 7am to 7pm - Pager - [phone] After 7pm go to [email or web address] - password [facility]  [facility] - Office   [phone]

## 2016-11-15 NOTE — ED Notes (Signed)
LT calf - 47 cm

## 2016-11-16 ENCOUNTER — Inpatient Hospital Stay (HOSPITAL_COMMUNITY): Payer: Medicare Other

## 2016-11-16 DIAGNOSIS — S8012XA Contusion of left lower leg, initial encounter: Secondary | ICD-10-CM

## 2016-11-16 DIAGNOSIS — M7989 Other specified soft tissue disorders: Secondary | ICD-10-CM

## 2016-11-16 DIAGNOSIS — I4891 Unspecified atrial fibrillation: Secondary | ICD-10-CM

## 2016-11-16 DIAGNOSIS — I2510 Atherosclerotic heart disease of native coronary artery without angina pectoris: Secondary | ICD-10-CM

## 2016-11-16 DIAGNOSIS — I10 Essential (primary) hypertension: Secondary | ICD-10-CM

## 2016-11-16 LAB — COMPREHENSIVE METABOLIC PANEL
ALK PHOS: 100 U/L (ref 38–126)
ALT: 21 U/L (ref 17–63)
ANION GAP: 8 (ref 5–15)
AST: 41 U/L (ref 15–41)
Albumin: 2.9 g/dL — ABNORMAL LOW (ref 3.5–5.0)
BUN: 22 mg/dL — ABNORMAL HIGH (ref 6–20)
CALCIUM: 8.2 mg/dL — AB (ref 8.9–10.3)
CO2: 24 mmol/L (ref 22–32)
CREATININE: 2.54 mg/dL — AB (ref 0.61–1.24)
Chloride: 102 mmol/L (ref 101–111)
GFR, EST AFRICAN AMERICAN: 33 mL/min — AB (ref >60)
GFR, EST NON AFRICAN AMERICAN: 28 mL/min — AB (ref >60)
Glucose, Bld: 127 mg/dL — ABNORMAL HIGH (ref 65–99)
Potassium: 4 mmol/L (ref 3.5–5.1)
SODIUM: 134 mmol/L — AB (ref 135–145)
Total Bilirubin: 1.4 mg/dL — ABNORMAL HIGH (ref 0.3–1.2)
Total Protein: 6.4 g/dL — ABNORMAL LOW (ref 6.5–8.1)

## 2016-11-16 LAB — CBC
HCT: 34.6 % — ABNORMAL LOW (ref 39.0–52.0)
Hemoglobin: 10.4 g/dL — ABNORMAL LOW (ref 13.0–17.0)
MCH: 27.4 pg (ref 26.0–34.0)
MCHC: 30.1 g/dL (ref 30.0–36.0)
MCV: 91.3 fL (ref 78.0–100.0)
PLATELETS: 188 10*3/uL (ref 150–400)
RBC: 3.79 MIL/uL — AB (ref 4.22–5.81)
RDW: 16.9 % — ABNORMAL HIGH (ref 11.5–15.5)
WBC: 13.1 10*3/uL — AB (ref 4.0–10.5)

## 2016-11-16 LAB — BASIC METABOLIC PANEL
ANION GAP: 8 (ref 5–15)
BUN: 23 mg/dL — ABNORMAL HIGH (ref 6–20)
CO2: 22 mmol/L (ref 22–32)
Calcium: 8.2 mg/dL — ABNORMAL LOW (ref 8.9–10.3)
Chloride: 105 mmol/L (ref 101–111)
Creatinine, Ser: 2.46 mg/dL — ABNORMAL HIGH (ref 0.61–1.24)
GFR calc Af Amer: 34 mL/min — ABNORMAL LOW (ref >60)
GFR, EST NON AFRICAN AMERICAN: 29 mL/min — AB (ref >60)
Glucose, Bld: 92 mg/dL (ref 65–99)
POTASSIUM: 4.1 mmol/L (ref 3.5–5.1)
SODIUM: 135 mmol/L (ref 135–145)

## 2016-11-16 LAB — PROTIME-INR
INR: 1.77
PROTHROMBIN TIME: 20.8 s — AB (ref 11.4–15.2)

## 2016-11-16 LAB — HEPARIN LEVEL (UNFRACTIONATED)
HEPARIN UNFRACTIONATED: 0.27 [IU]/mL — AB (ref 0.30–0.70)
HEPARIN UNFRACTIONATED: 0.18 [IU]/mL — AB (ref 0.30–0.70)
Heparin Unfractionated: 0.17 IU/mL — ABNORMAL LOW (ref 0.30–0.70)

## 2016-11-16 LAB — URINE CULTURE

## 2016-11-16 LAB — TROPONIN I: Troponin I: 0.08 ng/mL (ref <0.03)

## 2016-11-16 MED ORDER — AMIODARONE HCL IN DEXTROSE 360-4.14 MG/200ML-% IV SOLN
30.0000 mg/h | INTRAVENOUS | Status: AC
  Administered 2016-11-16: 30 mg/h via INTRAVENOUS
  Filled 2016-11-16: qty 200

## 2016-11-16 MED ORDER — AMIODARONE HCL 200 MG PO TABS
400.0000 mg | ORAL_TABLET | Freq: Two times a day (BID) | ORAL | Status: DC
  Administered 2016-11-16: 400 mg via ORAL
  Filled 2016-11-16: qty 2

## 2016-11-16 MED ORDER — DILTIAZEM HCL 100 MG IV SOLR
5.0000 mg/h | INTRAVENOUS | Status: DC
  Administered 2016-11-16 (×2): 5 mg/h via INTRAVENOUS
  Filled 2016-11-16: qty 100

## 2016-11-16 MED ORDER — METOPROLOL TARTRATE 50 MG PO TABS
75.0000 mg | ORAL_TABLET | Freq: Two times a day (BID) | ORAL | Status: DC
  Administered 2016-11-16 (×2): 75 mg via ORAL
  Filled 2016-11-16 (×3): qty 1

## 2016-11-16 MED ORDER — AMIODARONE HCL IN DEXTROSE 360-4.14 MG/200ML-% IV SOLN
30.0000 mg/h | INTRAVENOUS | Status: DC
Start: 2016-11-16 — End: 2016-11-19
  Administered 2016-11-16 (×2): 30 mg/h via INTRAVENOUS
  Administered 2016-11-17 – 2016-11-18 (×5): 60 mg/h via INTRAVENOUS
  Administered 2016-11-19: 30 mg/h via INTRAVENOUS
  Filled 2016-11-16 (×7): qty 200

## 2016-11-16 NOTE — Progress Notes (Signed)
Pt converted to sinus tach on the monitor from afib at 2038 on 11/15/16. Printed out strip and placed in pt chart. Decreased cardizem gtt to 10 mg/hr from 15 mg/hr and continued the amio gtt at 30 mg/hr. Will continue to monitor and titrate as needed.  Ericka Pontiff, RN 2:23 AM  11/16/16

## 2016-11-16 NOTE — Progress Notes (Signed)
Left calf = 47cm

## 2016-11-16 NOTE — Consult Note (Signed)
CARDIOLOGY CONSULT NOTE   [name] MRN: [id number] DOB/AGE: 49/23/1967 49 y.o.  Admit Date: 11/15/2016 Referring Physician: TRH_Singh MD Primary Physician: No PCP Per Patient Consulting Cardiologist: Dorris Carnes MD Primary Cardiologist : Dr. Corena Pilgrim with [facility] in [place]  Reason for Consultation: Atrial fib   Clinical Summary Mr. Attridge is a 51 y.o.male with known history of atrial fibrillation, hypertension, coronary artery disease, renal transplant, end-stage renal disease with dialysis shunt to his left arm. Admitted with fever and left distal lower extremity cellulitis. He was also found to have atrial fib with RVR CHADS VASC Score of 4. The patient's heart rate was difficult to control and therefore was placed on amiodarone drip, digoxin, IV heparin and taken off of Coumadin in the setting of an infected hematoma of the left calf. He has been started on antibiotic therapy. Will and is Echocardiogram has been ordered.  The patient states that he is followed by a cardiologist in [place] and has had atrial fibrillation "for years". He was weaned off amiodarone by his primary cardiologist after undergoing ablation at [facility] in December 2016. His primary care physician, Dr. Trena Platt, manages his Coumadin. The patient reports that when he has an infection he often goes into atrial fibrillation. He has been medically compliant at home concerning his medications for A. fib and Coumadin therapy.  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 The pain became more severe and therefore he reported to ER. He was found to be febrile he was found to be febrile, temperature of 99.9. Blood pressure 126/79 heart rate 90 respirations 18 and O2 sat 92%. Doppler ultrasound was negative for PE bilaterally. Chest x-ray revealed metallic fragments  throughout the anterior right chest with right pleural thickening. No evidence of CHF or pneumonia. Moderate cardiomegaly was noted.  He is currently on antibiotic therapy, heparin IV, amiodarone.     Allergies  Allergen Reactions  . Vancomycin Swelling    Medications Scheduled Medications: . aspirin  81 mg Oral Daily  . atorvastatin  80 mg Oral q1800  . calcitRIOL  0.5 mcg Oral Daily  . doxycycline (VIBRAMYCIN) IV  100 mg Intravenous Q12H  . ferrous sulfate  325 mg Oral Q breakfast  . furosemide  40 mg Oral Daily  . magnesium oxide  200 mg Oral Daily  . metoprolol succinate  50 mg Oral Daily  . multivitamin with minerals  1 tablet Oral Daily  . pantoprazole  40 mg Oral Daily  . piperacillin-tazobactam (ZOSYN)  IV  3.375 g Intravenous Q8H  . predniSONE  10 mg Oral Q breakfast  . tacrolimus  1 mg Oral BID  . tamsulosin  0.4 mg Oral Daily     Infusions: . amiodarone 30 mg/hr (11/15/16 2327)  . diltiazem (CARDIZEM) infusion 10 mg/hr (11/16/16 0038)  . heparin 1,600 Units/hr (11/15/16 1825)     PRN Medications:  acetaminophen, HYDROcodone-acetaminophen   Past Medical History:  Diagnosis Date  . Atrial fibrillation ([place])   . CHF (congestive heart failure) ([place])   . Coronary artery disease   . Hypertension   . Kidney transplanted     Past Surgical History:  Procedure Laterality Date  . ABLATION    . CHOLECYSTECTOMY    . TRANSPLANTATION RENAL  2005    Family History  Problem Relation Age of Onset  .  Hypertension Mother   . Heart attack Father     Social History Mr. Pfluger reports that he has never smoked. He has never used smokeless tobacco. Mr. Shieh reports that he does not drink alcohol.  Review of Systems Complete review of systems are found to be negative unless outlined in H&P above.  Physical Examination Blood pressure 108/71, pulse (!) 121, temperature 99.2 F (37.3 C), temperature source Oral, resp. rate (!) 30, height 6\' 2"  (1.88 m), weight  277 lb 12.5 oz (126 kg), SpO2 97 %.  Intake/Output Summary (Last 24 hours) at 11/16/16 0823 Last data filed at 11/16/16 0430  Gross per 24 hour  Intake           264.22 ml  Output              350 ml  Net           -85.78 ml    Telemetry: Sinus tachycardia, heart rate of 120 -125 bpm at rest  JEH:UDJSHFW, complaining of left lower extremity pain  HEENT: Conjunctiva and lids normal, oropharynx clear with moist mucosa. Neck: Supple, no elevated JVP or carotid bruits, no thyromegaly. Lungs: Clear to auscultation, nonlabored breathing at rest. Cardiac: Regular rate and rhythm,tachycardic, no S3 or significant systolic murmur, no pericardial rub. Abdomen: Soft, nontender, no hepatomegaly, bowel sounds present, no guarding or rebound. Extremities: Dialysis catheter to the left forearm. Left lower leg is edematous, warm, exquisitely painful to touch. Right leg normal.  Musculoskeletal: No kyphosis. Neuropsychiatric: Alert and oriented x3, affect grossly appropriate.  Prior Cardiac Testing/Procedures 1. Patient reported A. fib ablation in [facility] 10/27/2015   Lab Results  Basic Metabolic Panel:  Recent Labs Lab 11/15/16 0205 11/15/16 1526 11/16/16 0427  NA 138 136 135  K 4.1 4.2 4.1  CL 106 107 105  CO2 25 22 22   GLUCOSE 99 118* 92  BUN 23* 21* 23*  CREATININE 2.48* 2.31* 2.46*  CALCIUM 9.1 8.3* 8.2*    Liver Function Tests:  Recent Labs Lab 11/15/16 0205 11/15/16 1526  AST 36 38  ALT 19 21  ALKPHOS 93 98  BILITOT 2.5* 1.8*  PROT 6.9 6.4*  ALBUMIN 3.4* 3.1*    CBC:  Recent Labs Lab 11/15/16 0205 11/15/16 1526 11/16/16 0427  WBC 13.1* 12.3* 13.1*  NEUTROABS 9.3*  --   --   HGB 11.3* 10.2* 10.4*  HCT 38.0* 33.7* 34.6*  MCV 91.8 91.8 91.3  PLT 205 188 188    Cardiac Enzymes:  Recent Labs Lab 11/15/16 1526 11/15/16 2121 11/16/16 0427  TROPONINI 0.09* 0.09* 0.08*    Radiology: Dg Chest 2 View  Result Date: 11/15/2016 CLINICAL DATA:   Fever and chills, onset yesterday EXAM: CHEST  2 VIEW COMPARISON:  None. FINDINGS: Numerous metallic fragments scattered throughout the anterior right chest wall. Pleural fluid or thickening in the right hemithorax. Curvilinear scarring or atelectasis in the right base. The pleural thickening and the curvilinear opacities could be chronic and related to the prior trauma, but no prior imaging is available to document chronicity. The left lung is clear.  There is moderate cardiomegaly. IMPRESSION: 1. Numerous metallic fragments throughout the anterior right chest wall as well as right pleural thickening and curvilinear right base opacities. This may all be chronic. 2. No confluent consolidation. 3. Moderate cardiomegaly. Electronically Signed   By: Andreas Newport M.D.   On: 11/15/2016 04:31   US Venous Img Lower Bilateral  Result Date: 11/15/2016 CLINICAL DATA:  Right lower extremity  pain and edema. History of smoking. Evaluate for DVT. EXAM: BILATERAL LOWER EXTREMITY VENOUS DOPPLER ULTRASOUND TECHNIQUE: Gray-scale sonography with graded compression, as well as color Doppler and duplex ultrasound were performed to evaluate the lower extremity deep venous systems from the level of the common femoral vein and including the common femoral, femoral, profunda femoral, popliteal and calf veins including the posterior tibial, peroneal and gastrocnemius veins when visible. The superficial great saphenous vein was also interrogated. Spectral Doppler was utilized to evaluate flow at rest and with distal augmentation maneuvers in the common femoral, femoral and popliteal veins. COMPARISON:  None. FINDINGS: RIGHT LOWER EXTREMITY Common Femoral Vein: No evidence of thrombus. Normal compressibility, respiratory phasicity and response to augmentation. Saphenofemoral Junction: No evidence of thrombus. Normal compressibility and flow on color Doppler imaging. Profunda Femoral Vein: No evidence of thrombus. Normal compressibility  and flow on color Doppler imaging. Femoral Vein: No evidence of thrombus. Normal compressibility, respiratory phasicity and response to augmentation. Popliteal Vein: No evidence of thrombus. Normal compressibility, respiratory phasicity and response to augmentation. Calf Veins: No evidence of thrombus. Normal compressibility and flow on color Doppler imaging. Superficial Great Saphenous Vein: No evidence of thrombus. Normal compressibility and flow on color Doppler imaging. Venous Reflux:  None. Other Findings:  None. LEFT LOWER EXTREMITY Common Femoral Vein: No evidence of thrombus. Normal compressibility, respiratory phasicity and response to augmentation. Saphenofemoral Junction: No evidence of thrombus. Normal compressibility and flow on color Doppler imaging. Profunda Femoral Vein: No evidence of thrombus. Normal compressibility and flow on color Doppler imaging. Femoral Vein: No evidence of thrombus. Normal compressibility, respiratory phasicity and response to augmentation. Popliteal Vein: There is age-indeterminate mixed echogenic nonocclusive thrombus within the left popliteal vein (images 50 and 59) which appears to contain an internal echogenic linear septation. Calf Veins: No evidence of thrombus. Normal compressibility and flow on color Doppler imaging. Superficial Great Saphenous Vein: No evidence of thrombus. Normal compressibility and flow on color Doppler imaging. Venous Reflux:  None. Other Findings: Mixed echogenic complex fluid collection within the left popliteal fossa measuring at least 6.1 x 6.9 x 5.9 cm a moderate amount of subcutaneous edema is noted at the level of the left calf and lower leg (images 69 through 71). IMPRESSION: 1. 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 2. No evidence of DVT within the right lower extremity. 3. Complex left-sided Baker cyst measuring at least 6.9 cm in diameter. Electronically Signed   By: Sandi Mariscal  M.D.   On: 11/15/2016 11:40     ECG: Sinus tachycardia, heart rate 139 bpm, with frequent PVCs.   Impression and Recommendations  1. Atrial fib with RVR: History of ablation in December 2016 by cardiologist in [place]. The patient remains on Coumadin therapy at home. He has been taken off of Coumadin and placed on IV heparin. In the setting of infected hematoma of his lower left leg.  Keep on IV amiodarone  increase metoprolol to 75 mg twice a day, and discontinue long-acting metoprolol. We'll begin weaning off of diltiazem drip with increased dose of metoprolol, to prevent hypotension.. Agree with stopping Coumadin in the setting of possible I&D of infected hematoma and remain on heparin. .   2. Coronary artery disease: Medically managed with beta blocker, aspirin, statin therapy. Has had recent stress test per patient's report which was found to be "okay". He is followed regularly by his primary cardiologist in [place]. Slight elevation in troponin of 0.09, 0.09, and 0.08  respectively. Lightly in the setting of demand ischemia with elevated heart rate. Once heart rate is more controlled we'll plan echocardiogram. He denies chest pain, or dyspnea on exertion.   3. Hypertension: Blood pressure soft. Recommend IV fluid hydration in the setting of infective process.  4. Hx of renal transplant: With chronic kidney disease. Creatinine 2.46.   Signed: Phill Myron. Lawrence NP AACC  11/16/2016, 8:23 AM Co-Sign MD  Pt seen and examined  I agree with findings as noted above by Arnold Long  I have amended note to reflect my findings Pt admitted with lower extreemity cellulitis  Found to be in afib with RVR Pt denies palpitations On exam:  JVP is increased  Lungs with normal airflow  Cardiac exam:  RRR  No S3  Ext L leg swollen Tele initially with atrial fib  NOw appears to be atrial flutter 120  I would continue anticoagulation.  Continue IV amiodarone and b blocker    Would have pt sign release of inforamtion to get records from cardiologist in [place] Strict I/O   Volume does appear to be mildly increased    I am not convinced of any active ischemia WOuld get 12 lead EKG to document rhythm    Dorris Carnes

## 2016-11-16 NOTE — Progress Notes (Signed)
Calf circumference was 47cm at 2000 on 11/15/16, 47 cm at 0000 on 11/16/16 and 46.75 cm at 0400 11/16/16.  Arkie Tagliaferro Rica Mote, RN

## 2016-11-16 NOTE — Progress Notes (Signed)
[name], male   DOB: [birth year], 51 y.o.   MRN: [id number] MRI FU   NO EVIDENCE OF ABSCESS  MONITOR FOR COMPARTMENT SYNDROME WITH SERIAL NEURO CHECKS  START PT FOR AROM LEFT KNEE   START MOBILIZATION   REASSESS DVT AS YOU SEE FIT

## 2016-11-16 NOTE — Progress Notes (Signed)
**Note De-Identified [name] Obfuscation** EKG completed; RN notified and placed in patient chart.

## 2016-11-16 NOTE — Progress Notes (Addendum)
[name], male   DOB: Feb 19, 1966, 51 y.o.   MRN: [id number] Preliminary eval  rec MRI without contrast (renal transplant)  I do think he has a dvt and a bakers cyst which is not the issue and a hematoma which is related to the swelling

## 2016-11-16 NOTE — Progress Notes (Signed)
Left leg calf circumference =46.5cm
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
## 2016-11-16 NOTE — Consult Note (Signed)
[name], male   DOB: November 07, 1966, 51 y.o.   MRN: [id number]  CONSULT [id number] Detailed history Detailed exam Low complexity  Requested by DR [facility] Reason for consultation : LEFT CALF SWOLLEN  Chief Complaint  Patient presents with  . Fever    HPI [name] is a 51 y.o. male.  51 year old male works at [employer] walks at least 10-12,000 steps per day on his pedometer started having pain in his left calf Friday. Went to primary care had an ultrasound and [place] told he had no abnormal findings other than perhaps a cystic came here when he developed fever pain and increased swelling behind the knee and in the calf. According to the medical doctor he became septic and was admitted to the ICU he has an extensive medical history  [name]  is a 51 y.o. male, w Atrial fibrillation , Hypertension, CHF ? EF, CAD, renal transplant who presented with c/o fever x1 day,  Pt notes that his left leg been swollen x4 days.  Pt had evaluation in ED, at [facility].  Per pt no blood clot.  Pt sent home.  Presents today due to fever.  Denies sore throat, ear ache, cough, cp, palp, sob.    In ED, pt felt to have cellulitis by ED of the left distal lower ext.  CXR negative.  Urinalysis negative.  Source of fever appears to be cellulitis.  Pt will be admitted for Afib with RVR and cellulitis.     Review of Systems (at least 2) Review of Systems Fever initially  No current chest pain  No current numbness or tingling in the left leg  Past Medical History:  Diagnosis Date  . Atrial fibrillation ([place])   . CHF (congestive heart failure) ([place])   . Coronary artery disease   . Hypertension   . Kidney transplanted      Past Surgical History:  Procedure Laterality Date  . ABLATION    . CHOLECYSTECTOMY    . TRANSPLANTATION RENAL  2005     Social History  Social History  Substance Use Topics  . Smoking status: Never Smoker  . Smokeless tobacco: Never Used  .  Alcohol use No    Allergies  Allergen Reactions  . Vancomycin Swelling    Current Facility-Administered Medications  Medication Dose Route Frequency Provider Last Rate Last Dose  . acetaminophen (TYLENOL) tablet 650 mg  650 mg Oral Q6H PRN Thurnell Lose, MD   650 mg at 11/16/16 0430  . amiodarone (NEXTERONE PREMIX) 360-4.14 MG/200ML-% (1.8 mg/mL) IV infusion  30 mg/hr Intravenous Continuous Fay Records, MD      . amiodarone (NEXTERONE PREMIX) 360-4.14 MG/200ML-% (1.8 mg/mL) IV infusion  30 mg/hr Intravenous Continuous Fay Records, MD      . aspirin chewable tablet 81 mg  81 mg Oral Daily Thurnell Lose, MD   81 mg at 11/16/16 1000  . atorvastatin (LIPITOR) tablet 80 mg  80 mg Oral q1800 Jani Gravel, MD   80 mg at 11/15/16 1711  . calcitRIOL (ROCALTROL) capsule 0.5 mcg  0.5 mcg Oral Daily Jani Gravel, MD   0.5 mcg at 11/16/16 1000  . diltiazem (CARDIZEM) 100 mg in dextrose 5 % 100 mL (1 mg/mL) infusion  5-15 mg/hr Intravenous Titrated Lendon Colonel, NP 5 mL/hr at 11/16/16 1014 5 mg/hr at 11/16/16 1014  . doxycycline (VIBRAMYCIN) 100 mg in dextrose 5 % 250 mL IVPB  100 mg Intravenous Q12H Prashant K  Candiss Norse, MD   100 mg at 11/16/16 0903  . ferrous sulfate tablet 325 mg  325 mg Oral Q breakfast Jani Gravel, MD   325 mg at 11/16/16 0841  . furosemide (LASIX) tablet 40 mg  40 mg Oral Daily Jani Gravel, MD   40 mg at 11/16/16 0941  . heparin ADULT infusion 100 units/mL (25000 units/266mL sodium chloride 0.45%)  1,850 Units/hr Intravenous Continuous Thurnell Lose, MD 18.5 mL/hr at 11/16/16 1132 1,850 Units/hr at 11/16/16 1132  . HYDROcodone-acetaminophen (NORCO/VICODIN) 5-325 MG per tablet 1 tablet  1 tablet Oral Q6H PRN Thurnell Lose, MD   1 tablet at 11/16/16 0316  . magnesium oxide (MAG-OX) tablet 200 mg  200 mg Oral Daily Jani Gravel, MD   200 mg at 11/16/16 0944  . metoprolol tartrate (LOPRESSOR) tablet 75 mg  75 mg Oral BID Lendon Colonel, NP   75 mg at 11/16/16 6962  . multivitamin  with minerals tablet 1 tablet  1 tablet Oral Daily Thurnell Lose, MD   1 tablet at 11/16/16 0943  . pantoprazole (PROTONIX) EC tablet 40 mg  40 mg Oral Daily Jani Gravel, MD   40 mg at 11/16/16 1000  . piperacillin-tazobactam (ZOSYN) IVPB 3.375 g  3.375 g Intravenous Q8H Thurnell Lose, MD   3.375 g at 11/15/16 2326  . predniSONE (DELTASONE) tablet 10 mg  10 mg Oral Q breakfast Jani Gravel, MD   10 mg at 11/16/16 0801  . tacrolimus (PROGRAF) capsule 1 mg  1 mg Oral BID Jani Gravel, MD   1 mg at 11/16/16 0942  . tamsulosin (FLOMAX) capsule 0.4 mg  0.4 mg Oral Daily Jani Gravel, MD   0.4 mg at 11/16/16 0940      Physical Exam-Detailed Physical Exam  Blood pressure 119/84, pulse (!) 115, temperature 99.3 F (37.4 C), temperature source Oral, resp. rate (!) 26, height 6\' 2"  (1.88 m), weight 277 lb 12.5 oz (126 kg), SpO2 94 %. Gen. appearance NORMAL NO DISTRESS Cardiovascular exam the pulses are 2+ DP LEFT FOOT  The ambulatory status is CURRENTLY NOT DUE TO CALF PAIN   Extremity examined RIGHT LEG Inspection Range of motion assessment full range of motion as recorded Stability assessment stability test reveal no instability or laxity Muscle strength and muscle tone are normal with no atrophy or tremors Skin there are no scars rashes lesions or lacerations  Sensation to touch is normal The patient is oriented to person place and time The patient's mood and affect show no depression or anxiety or agitation  LEFT LOWER extremity was examined and the following findings were noted  Tenderness in the left calf no swelling of the left knee joint. Decreased range of motion left knee normal left ankle both knee and ankle are stable muscle tone is normal compartments are still soft. Skin has a blister no evidence of laceration. The dorsalis pedis pulses intact despite the foot swelling. Normal sensation is noted in the left foot   MEDICAL DECISION MAKING (minimum/low)  Data Reviewed  I have  personally reviewed the imaging studies and the report and my interpretation is:  Ultrasound indicates the following: Other Findings: Mixed echogenic complex fluid collection within the left popliteal fossa measuring at least 6.1 x 6.9 x 5.9 cm a moderate amount of subcutaneous edema is noted at the level of the left calf and lower leg (images 69 through 71).   IMPRESSION: 1. 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  2. No evidence of DVT within the right lower extremity. 3. Complex left-sided Baker cyst measuring at least 6.9 cm in diameter.     Electronically Signed   By: Sandi Mariscal M.D.   On: 11/15/2016 11:40     Assessment    Probable Baker's cyst unrelated to the current left calf swelling, ruptured Muscle with probable hematoma. I do not feel that there is an infected Baker's cyst this would be highly unusual. The DVT in the popliteal vein is an issue as well    Plan    MRI to define fluid collections possible infection and to assist in definitive management       Arther Abbott 11/16/2016, 12:09 PM   Carole Civil MD

## 2016-11-16 NOTE — Progress Notes (Signed)
Left calf circumference 47cm

## 2016-11-16 NOTE — Progress Notes (Signed)
Pharmacy Anticoagulation and Antibiotic Note  [name] is a 51 y.o. male admitted on 11/15/2016 with deep tissue abscess.  Pharmacy has been consulted for ZOSYN dosing.  Pt noted to have allergy to Vancomycin.  Also asked to initiate Heparin for VTE treatment, no bolus per MD.  Pt did receive Vitamin K 2mg  in ED.  INR therapeutic on admission. Heparin level 0.27 on 1850 units/hr.  No bleeding reported.  Plan: Zosyn 3.375g IV q8h (4 hour infusion).  Monitor progress, labs, c/s Increase Heparin infusion to 2000 units/hr (no bolus), Goal Heparin level = 0.3 - 0.7 Heparin level in 6-8 hours then daily  Height: 6\' 2"  (188 cm) Weight: 277 lb 12.5 oz (126 kg) IBW/kg (Calculated) : 82.2  Temp (24hrs), Avg:100 F (37.8 C), Min:98.9 F (37.2 C), Max:100.9 F (38.3 C)   Recent Labs Lab 11/15/16 0205 11/15/16 0343 11/15/16 1526 11/16/16 0427 11/16/16 1027  WBC 13.1*  --  12.3* 13.1*  --   CREATININE 2.48*  --  2.31* 2.46* 2.54*  LATICACIDVEN  --  1.76  --   --   --    Heparin Unfractionated 0.30 - 0.70 IU/mL 0.27      INR Last Three Days: Recent Labs     11/15/16  0205  11/15/16  1526  11/16/16  0427  INR  2.37  2.12  1.77   Estimated Creatinine Clearance: 50.2 mL/min (by C-G formula based on SCr of 2.54 mg/dL (H)).    Allergies  Allergen Reactions  . Vancomycin Swelling   Antimicrobials this admission: Zosyn 1/1 >>   Dose adjustments this admission:  Microbiology results:  BCx: pending  UCx: pending   Sputum:    MRSA PCR:   Thank you for allowing pharmacy to be a part of this patient's care.  Hart Robinsons A 11/16/2016 2:40 PM

## 2016-11-17 ENCOUNTER — Inpatient Hospital Stay (HOSPITAL_COMMUNITY): Payer: Medicare Other

## 2016-11-17 DIAGNOSIS — S8012XD Contusion of left lower leg, subsequent encounter: Secondary | ICD-10-CM

## 2016-11-17 LAB — CBC
HEMATOCRIT: 31.6 % — AB (ref 39.0–52.0)
HEMOGLOBIN: 9.7 g/dL — AB (ref 13.0–17.0)
MCH: 27.6 pg (ref 26.0–34.0)
MCHC: 30.7 g/dL (ref 30.0–36.0)
MCV: 90 fL (ref 78.0–100.0)
Platelets: 192 10*3/uL (ref 150–400)
RBC: 3.51 MIL/uL — ABNORMAL LOW (ref 4.22–5.81)
RDW: 16.5 % — AB (ref 11.5–15.5)
WBC: 13.4 10*3/uL — ABNORMAL HIGH (ref 4.0–10.5)

## 2016-11-17 LAB — HEPARIN LEVEL (UNFRACTIONATED)
HEPARIN UNFRACTIONATED: 0.67 [IU]/mL (ref 0.30–0.70)
HEPARIN UNFRACTIONATED: 0.7 [IU]/mL (ref 0.30–0.70)
Heparin Unfractionated: 0.87 IU/mL — ABNORMAL HIGH (ref 0.30–0.70)

## 2016-11-17 LAB — PROTIME-INR
INR: 1.73
Prothrombin Time: 20.4 seconds — ABNORMAL HIGH (ref 11.4–15.2)

## 2016-11-17 MED ORDER — METOPROLOL TARTRATE 50 MG PO TABS: 100.0000 mg | ORAL_TABLET | Freq: Two times a day (BID) | ORAL | Status: DC

## 2016-11-17 MED ORDER — METOPROLOL TARTRATE 50 MG PO TABS
100.0000 mg | ORAL_TABLET | Freq: Two times a day (BID) | ORAL | Status: DC
  Administered 2016-11-17 – 2016-11-25 (×17): 100 mg via ORAL
  Filled 2016-11-17 (×17): qty 2

## 2016-11-17 NOTE — Progress Notes (Signed)
L leg calf circumference 47 cm

## 2016-11-17 NOTE — Progress Notes (Signed)
ANTICOAGULATION CONSULT NOTE -  Pharmacy Consult for Heparin Indication: VTE treatment  Allergies  Allergen Reactions  . Vancomycin Swelling    Patient Measurements: Height: 6\' 2"  (188 cm) Weight: 278 lb 7.1 oz (126.3 kg) IBW/kg (Calculated) : 82.2 HEPARIN DW (KG): 109.3  Vital Signs: Temp: 99.3 F (37.4 C) (01/03 1133) Temp Source: Oral (01/03 1133) BP: 105/70 (01/03 1500) Pulse Rate: 132 (01/03 1500)  Labs:  Recent Labs  11/15/16 1526 11/15/16 2121 11/16/16 0427 11/16/16 1027  11/16/16 2135 11/17/16 0606 11/17/16 1457  HGB 10.2*  --  10.4*  --   --   --  9.7*  --   HCT 33.7*  --  34.6*  --   --   --  31.6*  --   PLT 188  --  188  --   --   --  192  --   LABPROT 24.1*  --  20.8*  --   --   --  20.4*  --   INR 2.12  --  1.77  --   --   --  1.73  --   HEPARINUNFRC  --   --  0.17*  --   < > 0.18* 0.67 0.87*  CREATININE 2.31*  --  2.46* 2.54*  --   --   --   --   TROPONINI 0.09* 0.09* 0.08*  --   --   --   --   --   < > = values in this interval not displayed.  Estimated Creatinine Clearance: 50.2 mL/min (by C-G formula based on SCr of 2.54 mg/dL (H)).   Medical History: Past Medical History:  Diagnosis Date  . Atrial fibrillation ([place])   . CHF (congestive heart failure) ([place])   . Coronary artery disease   . Hypertension   . Kidney transplanted     Medications:  Prescriptions Prior to Admission  Medication Sig Dispense Refill Last Dose  . atorvastatin (LIPITOR) 80 MG tablet Take 80 mg by mouth daily.   11/14/2016 at Unknown time  . calcitRIOL (ROCALTROL) 0.5 MCG capsule Take 0.5 mcg by mouth daily.   11/14/2016 at Unknown time  . calcium carbonate 1250 MG capsule Take 1,800 mg by mouth 2 (two) times daily with a meal.   11/14/2016 at Unknown time  . furosemide (LASIX) 40 MG tablet Take 120 mg by mouth daily as needed for fluid.    unknown  . magnesium oxide (MAG-OX) 400 MG tablet Take 800 mg by mouth 2 (two) times daily.    11/14/2016 at Unknown time  .  metoprolol succinate (TOPROL-XL) 25 MG 24 hr tablet Take 75 mg by mouth 2 (two) times daily.    11/14/2016 at 2100  . Multiple Vitamins-Minerals (MULTIVITAMIN WITH MINERALS) tablet Take 1 tablet by mouth daily.   11/14/2016 at Unknown time  . omeprazole (PRILOSEC) 40 MG capsule Take 40 mg by mouth daily.   11/14/2016 at Unknown time  . predniSONE (DELTASONE) 10 MG tablet Take 10 mg by mouth daily with breakfast.   11/14/2016 at Unknown time  . tacrolimus (PROGRAF) 1 MG capsule Take 1 mg by mouth 2 (two) times daily.   11/14/2016 at Unknown time  . tamsulosin (FLOMAX) 0.4 MG CAPS capsule Take 0.4 mg by mouth daily.    11/14/2016 at Unknown time  . warfarin (COUMADIN) 6 MG tablet Take 6 mg by mouth daily. 6 mg Sat/Sun,  7 mg Mon-Friday   11/14/2016 at 2100    Assessment: 51 y.o. male admitted  on 11/15/2016 with deep tissue abscess and fever. Patient has history of afib, HTN, EF CAD, and renal transplant. Pt noted left leg swollen for 4 days.  Asked to initiate Heparin for VTE treatment, no bolus per MD.  Pt did receive Vitamin K 2mg  IV in ED.  INR therapeutic on admission. Heparin level this afternoon is slightly elevated at  0.87.  No bleeding reported.  Goal of Therapy:  Heparin level 0.3-0.7 units/ml Monitor platelets by anticoagulation protocol: Yes   Plan:  Reduce Heparin to  2000 units/hr Check anti-Xa level in 6 hours and daily while on heparin Continue to monitor H&H and platelets  Isac Sarna, BS Vena [place], BCPS Clinical Pharmacist Pager [phone] 11/17/2016,3:40 PM

## 2016-11-17 NOTE — Progress Notes (Signed)
Left calf circumference is 47cm

## 2016-11-17 NOTE — Progress Notes (Signed)
Left calf circumference is 47 cm
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
## 2016-11-17 NOTE — Progress Notes (Signed)
[name], male   DOB: [birth year], 51 y.o.   MRN: [id number]   Patient Name: [name] Date of Encounter: 11/17/2016  Primary Cardiologist: Dr. Corena Pilgrim with [facility], [facility] Problem List     Active Problems:   Atrial fibrillation with RVR [facility])   Renal insufficiency   Anemia   Atrial fibrillation with rapid ventricular response (HCC)   Cellulitis   Hematoma of left lower extremity   Swelling of calf     Subjective   Complains of left leg pain. Can't feel heart racing.  Inpatient Medications    Scheduled Meds: . aspirin  81 mg Oral Daily  . atorvastatin  80 mg Oral q1800  . calcitRIOL  0.5 mcg Oral Daily  . doxycycline (VIBRAMYCIN) IV  100 mg Intravenous Q12H  . ferrous sulfate  325 mg Oral Q breakfast  . furosemide  40 mg Oral Daily  . magnesium oxide  200 mg Oral Daily  . metoprolol tartrate  75 mg Oral BID  . multivitamin with minerals  1 tablet Oral Daily  . pantoprazole  40 mg Oral Daily  . piperacillin-tazobactam (ZOSYN)  IV  3.375 g Intravenous Q8H  . predniSONE  10 mg Oral Q breakfast  . tacrolimus  1 mg Oral BID  . tamsulosin  0.4 mg Oral Daily   Continuous Infusions: . amiodarone 30 mg/hr (11/17/16 0900)  . diltiazem (CARDIZEM) infusion 5 mg/hr (11/16/16 1014)  . heparin 2,200 Units/hr (11/17/16 0900)   PRN Meds: acetaminophen, HYDROcodone-acetaminophen   Vital Signs    Vitals:   11/17/16 0800 11/17/16 0830 11/17/16 0900 11/17/16 0930  BP: 107/72 (!) 115/39 (!) 110/91 (!) 114/49  Pulse: 64 (!) 122 (!) 119 (!) 142  Resp: (!) 26 (!) 21 (!) 32 18  Temp:      TempSrc:      SpO2: (!) 65% (!) 89% 98% 97%  Weight:      Height:        Intake/Output Summary (Last 24 hours) at 11/17/16 0948 Last data filed at 11/17/16 0900  Gross per 24 hour  Intake          2010.05 ml  Output              900 ml  Net          1110.05 ml   Filed Weights   11/15/16 0107 11/16/16 0500 11/17/16 0456  Weight: 275 lb (124.7  kg) 277 lb 12.5 oz (126 kg) 278 lb 7.1 oz (126.3 kg)    Physical Exam    GEN: Well nourished, well developed, in no acute distress.  HEENT: Grossly normal.  Neck: Supple,slight increase JVD, nocarotid bruits, or masses. Cardiac: RRR at 140/m, no murmurs, rubs, or gallops. No clubbing, cyanosis, edema. Left leg edema and cellulitis  Respiratory:  Respirations regular and unlabored, clear to auscultation bilaterally. GI: Soft, nontender, nondistended, BS + x 4. MS: no deformity or atrophy. Skin: warm and dry, no rash. Neuro:  Strength and sensation are intact. Psych: AAOx3.  Normal affect.  Labs    CBC  Recent Labs  11/15/16 0205  11/16/16 0427 11/17/16 0606  WBC 13.1*  < > 13.1* 13.4*  NEUTROABS 9.3*  --   --   --   HGB 11.3*  < > 10.4* 9.7*  HCT 38.0*  < > 34.6* 31.6*  MCV 91.8  < > 91.3 90.0  PLT 205  < > 188 192  < > = values in this  interval not displayed. Basic Metabolic Panel  Recent Labs  11/16/16 0427 11/16/16 1027  NA 135 134*  K 4.1 4.0  CL 105 102  CO2 22 24  GLUCOSE 92 127*  BUN 23* 22*  CREATININE 2.46* 2.54*  CALCIUM 8.2* 8.2*   Liver Function Tests  Recent Labs  11/15/16 1526 11/16/16 1027  AST 38 41  ALT 21 21  ALKPHOS 98 100  BILITOT 1.8* 1.4*  PROT 6.4* 6.4*  ALBUMIN 3.1* 2.9*   No results for input(s): LIPASE, AMYLASE in the last 72 hours. Cardiac Enzymes  Recent Labs  11/15/16 1526 11/15/16 2121 11/16/16 0427  TROPONINI 0.09* 0.09* 0.08*   BNP Invalid input(s): POCBNP D-Dimer No results for input(s): DDIMER in the last 72 hours. Hemoglobin A1C No results for input(s): HGBA1C in the last 72 hours. Fasting Lipid Panel No results for input(s): CHOL, HDL, LDLCALC, TRIG, CHOLHDL, LDLDIRECT in the last 72 hours. Thyroid Function Tests  Recent Labs  11/15/16 1526  TSH 0.871    Telemetry    Atrial tachycardia at 140/m - Personally Reviewed  ECG    11/17/15 Sinus tachy 119/m- Personally Reviewed  Radiology    Mr  Tibia Fibula Left Wo Contrast  Result Date: 11/16/2016 CLINICAL DATA:  Left calf swelling. History of coronary artery disease, congestive heart failure and renal transplant. EXAM: MRI OF LOWER LEFT EXTREMITY WITHOUT CONTRAST TECHNIQUE: Multiplanar, multisequence MR imaging of the left lower leg was performed. No intravenous contrast was administered. COMPARISON:  Venous Doppler ultrasound 11/15/2016. FINDINGS: Both lower legs are included on the axial images. Bones/Joint/Cartilage The tibia and fibula appear normal bilaterally. No evidence of acute fracture, dislocation or focal osseous lesion. Ligaments Not relevant for exam/indication. Muscles and Tendons As seen on ultrasound, there is a large complex fluid collection within the left calf. This lies within and superficial to the medial head of the gastrocnemius muscle, measuring up to 6.8 x 5.3 cm transverse and 12.9 cm in length. There are mixed areas of T1 and T2 hyperintensity within this lesion, most consistent with a subacute hematoma. There is some edema within the surrounding gastrocnemius muscle. No typical Baker's cyst. The Achilles tendon and additional ankle tendons appear intact, although their distal insertions are not imaged. Soft tissues Moderate diffuse subcutaneous edema throughout the left lower leg, greatest distally. IMPRESSION: 1. Large heterogeneous fluid collection within and superficial to the medial head of the left gastrocnemius muscle, most consistent with a subacute hematoma. Clinical follow up recommended to ensure resolution and exclude an underlying mass which has bled. If that is a clinical concern, follow up imaging in 3-6 months could be performed (to include post-contrast imaging). 2. Generalized subcutaneous edema throughout the left lower leg, most consistent with cellulitis. No other focal fluid collections. 3. No osseous abnormalities. Electronically Signed   By: Richardean Sale M.D.   On: 11/16/2016 15:41   US Venous Img  Lower Bilateral  Result Date: 11/15/2016 CLINICAL DATA:  Right lower extremity pain and edema. History of smoking. Evaluate for DVT. EXAM: BILATERAL LOWER EXTREMITY VENOUS DOPPLER ULTRASOUND TECHNIQUE: Gray-scale sonography with graded compression, as well as color Doppler and duplex ultrasound were performed to evaluate the lower extremity deep venous systems from the level of the common femoral vein and including the common femoral, femoral, profunda femoral, popliteal and calf veins including the posterior tibial, peroneal and gastrocnemius veins when visible. The superficial great saphenous vein was also interrogated. Spectral Doppler was utilized to evaluate flow at rest and with distal  augmentation maneuvers in the common femoral, femoral and popliteal veins. COMPARISON:  None. FINDINGS: RIGHT LOWER EXTREMITY Common Femoral Vein: No evidence of thrombus. Normal compressibility, respiratory phasicity and response to augmentation. Saphenofemoral Junction: No evidence of thrombus. Normal compressibility and flow on color Doppler imaging. Profunda Femoral Vein: No evidence of thrombus. Normal compressibility and flow on color Doppler imaging. Femoral Vein: No evidence of thrombus. Normal compressibility, respiratory phasicity and response to augmentation. Popliteal Vein: No evidence of thrombus. Normal compressibility, respiratory phasicity and response to augmentation. Calf Veins: No evidence of thrombus. Normal compressibility and flow on color Doppler imaging. Superficial Great Saphenous Vein: No evidence of thrombus. Normal compressibility and flow on color Doppler imaging. Venous Reflux:  None. Other Findings:  None. LEFT LOWER EXTREMITY Common Femoral Vein: No evidence of thrombus. Normal compressibility, respiratory phasicity and response to augmentation. Saphenofemoral Junction: No evidence of thrombus. Normal compressibility and flow on color Doppler imaging. Profunda Femoral Vein: No evidence of  thrombus. Normal compressibility and flow on color Doppler imaging. Femoral Vein: No evidence of thrombus. Normal compressibility, respiratory phasicity and response to augmentation. Popliteal Vein: There is age-indeterminate mixed echogenic nonocclusive thrombus within the left popliteal vein (images 50 and 59) which appears to contain an internal echogenic linear septation. Calf Veins: No evidence of thrombus. Normal compressibility and flow on color Doppler imaging. Superficial Great Saphenous Vein: No evidence of thrombus. Normal compressibility and flow on color Doppler imaging. Venous Reflux:  None. Other Findings: Mixed echogenic complex fluid collection within the left popliteal fossa measuring at least 6.1 x 6.9 x 5.9 cm a moderate amount of subcutaneous edema is noted at the level of the left calf and lower leg (images 69 through 71). IMPRESSION: 1. 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 2. No evidence of DVT within the right lower extremity. 3. Complex left-sided Baker cyst measuring at least 6.9 cm in diameter. Electronically Signed   By: Sandi Mariscal M.D.   On: 11/15/2016 11:40   US Arterial Seg Single  Result Date: 11/16/2016 CLINICAL DATA:  51 year old male with a history of left calf edema for 4 days. Cardiovascular risk factors listed are none. EXAM: NONINVASIVE PHYSIOLOGIC VASCULAR STUDY OF BILATERAL LOWER EXTREMITIES TECHNIQUE: Evaluation of both lower extremities was performed at rest, including calculation of ankle-brachial indices, multiple segmental pressure evaluation, segmental Doppler and segmental pulse volume recording. COMPARISON:  None. FINDINGS: Right: Resting ankle brachial index:  Noncompressible vasculature Segmental blood pressure: Right brachial 468 systolic Doppler: Tachycardia P Triphasic dorsalis pedis. Triphasic posterior tibial versus distortion given tachycardia. Left: Resting ankle brachial index: 1.39 Segmental blood  pressure: Left brachial not measured Doppler: Dorsalis pedis demonstrates tachycardia with monophasic waveform. Posterior tibial demonstrates monophasic waveform. IMPRESSION: Right: Noncompressible vasculature, with inability to calculate ABI. Distortion of the Doppler signal at the ankle given tachycardia, although the waveform appears triphasic. Correlation with physical exam and cardiovascular risk factors may be useful. For a more sensitive/ specific test, repeat noninvasive may be considered once tachycardia has resolved. Left: Resting ABI measures 1.39, potentially falsely elevated given the right-sided findings. Doppler signal is monophasic. This may be secondary to the distortion from the tachycardia, from increased tissue perfusion, or small vessel disease. Correlation with the patient's cardiovascular risk factors and physical exam may be useful. Again, a more sensitive/specific test may be gained from repeat noninvasive once tachycardia has resolved. Signed, Dulcy Fanny. Earleen Newport, DO [facility] Radiology Electronically Signed   By: Corrie Mckusick D.O.  On: 11/16/2016 09:44    Cardiac Studies     Patient Profile     51 y.o.male with known history of atrial fibrillation, hypertension, coronary artery disease, renal transplant, end-stage renal disease with dialysis shunt to his left arm. Admitted with fever and left distal lower extremity cellulitis. He was also found to have atrial fib with RVR CHADS VASC Score of 4. The patient's heart rate was difficult to control and therefore was placed on amiodarone drip, digoxin, IV heparin and taken off of Coumadin in the setting of an infected hematoma of the left groin. He has been started on antibiotic therapy  Assessment & Plan     1. Atrial fib with RVR: History of  Atrial fib ablation in December 2016 by cardiologist in [place].(Dr Loel Lofty)   He was seen over summer  No  symptmatic recurrence  Discussion of sotalol if recurred.  The patient remains on Coumadin therapy at home. He has been taken off of Coumadin and placed on IV heparin. In the setting of infected hematoma of his lower left leg.  Keep on IV amiodarone.  Would increase rate to 1 mg per min   increase metoprolol to 100 mg twice a day. Records reviewed from [place]: 2Decho 10/2015 Normal LVEF mild MR.  Echo here pending     2. Coronary artery disease: Medically managed with beta blocker, aspirin, statin therapy. Has had recent stress test per patient's report which was found to be "okay". He is followed regularly by his primary cardiologist in [place]. Slight elevation in troponin of 0.09, 0.09, and 0.08 respectively probable reflects demand ischemia with elevated heart rate.    3. Hypertension: Blood pressure soft.   4. Hx of renal transplant: With chronic kidney disease. Creatinine 2.46.  5. Infected hematoma on IV antibiotics  6. Left calf DVT ? Chronic on IV heparin, coumadin on hold.    Signed, Ermalinda Barrios, PA-C  11/17/2016, 9:48 AM   Pt seen and examined  I agree with findings as noted by Gerrianne Scale above   I have reviewed records from [place]  Pt remains in atrial flutter, atril fib  HR is not controlled  I would continue amiodarone   Increase gtt  Continue b blocker  Incraease as bp tolerates  Echo when HR slows   Continue heparin for now.  Dorris Carnes
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
## 2016-11-17 NOTE — Progress Notes (Signed)
ANTICOAGULATION CONSULT NOTE -  Pharmacy Consult for Heparin Indication: VTE treatment  Allergies  Allergen Reactions  . Vancomycin Swelling    Patient Measurements: Height: 6\' 2"  (188 cm) Weight: 278 lb 7.1 oz (126.3 kg) IBW/kg (Calculated) : 82.2 HEPARIN DW (KG): 109.3  Vital Signs: Temp: 99.3 F (37.4 C) (01/03 0400) Temp Source: Oral (01/03 0400) BP: 93/65 (01/03 0500) Pulse Rate: 120 (01/03 0500)  Labs:  Recent Labs  11/15/16 1526 11/15/16 2121  11/16/16 0427 11/16/16 1027 11/16/16 1354 11/16/16 2135 11/17/16 0606  HGB 10.2*  --   --  10.4*  --   --   --  9.7*  HCT 33.7*  --   --  34.6*  --   --   --  31.6*  PLT 188  --   --  188  --   --   --  192  LABPROT 24.1*  --   --  20.8*  --   --   --  20.4*  INR 2.12  --   --  1.77  --   --   --  1.73  HEPARINUNFRC  --   --   < > 0.17*  --  0.27* 0.18* 0.67  CREATININE 2.31*  --   --  2.46* 2.54*  --   --   --   TROPONINI 0.09* 0.09*  --  0.08*  --   --   --   --   < > = values in this interval not displayed.  Estimated Creatinine Clearance: 50.2 mL/min (by C-G formula based on SCr of 2.54 mg/dL (H)).   Medical History: Past Medical History:  Diagnosis Date  . Atrial fibrillation ([place])   . CHF (congestive heart failure) ([place])   . Coronary artery disease   . Hypertension   . Kidney transplanted     Medications:  Prescriptions Prior to Admission  Medication Sig Dispense Refill Last Dose  . atorvastatin (LIPITOR) 80 MG tablet Take 80 mg by mouth daily.   11/14/2016 at Unknown time  . calcitRIOL (ROCALTROL) 0.5 MCG capsule Take 0.5 mcg by mouth daily.   11/14/2016 at Unknown time  . calcium carbonate 1250 MG capsule Take 1,800 mg by mouth 2 (two) times daily with a meal.   11/14/2016 at Unknown time  . furosemide (LASIX) 40 MG tablet Take 120 mg by mouth daily as needed for fluid.    unknown  . magnesium oxide (MAG-OX) 400 MG tablet Take 800 mg by mouth 2 (two) times daily.    11/14/2016 at Unknown time  .  metoprolol succinate (TOPROL-XL) 25 MG 24 hr tablet Take 75 mg by mouth 2 (two) times daily.    11/14/2016 at 2100  . Multiple Vitamins-Minerals (MULTIVITAMIN WITH MINERALS) tablet Take 1 tablet by mouth daily.   11/14/2016 at Unknown time  . omeprazole (PRILOSEC) 40 MG capsule Take 40 mg by mouth daily.   11/14/2016 at Unknown time  . predniSONE (DELTASONE) 10 MG tablet Take 10 mg by mouth daily with breakfast.   11/14/2016 at Unknown time  . tacrolimus (PROGRAF) 1 MG capsule Take 1 mg by mouth 2 (two) times daily.   11/14/2016 at Unknown time  . tamsulosin (FLOMAX) 0.4 MG CAPS capsule Take 0.4 mg by mouth daily.    11/14/2016 at Unknown time  . warfarin (COUMADIN) 6 MG tablet Take 6 mg by mouth daily. 6 mg Sat/Sun,  7 mg Mon-Friday   11/14/2016 at 2100    Assessment: 51 y.o. male admitted  on 11/15/2016 with deep tissue abscess and fever. Patient has history of afib, HTN, EF CAD, and renal transplant. Pt noted left leg swollen for 4 days.  Asked to initiate Heparin for VTE treatment, no bolus per MD.  Pt did receive Vitamin K 2mg  IV in ED.  INR therapeutic on admission. Heparin level this AM 0.67.  No bleeding reported.  Goal of Therapy:  Heparin level 0.3-0.7 units/ml Monitor platelets by anticoagulation protocol: Yes   Plan:  Continue Heparin at 2200 units/hr Check anti-Xa level in6 hours and daily while on heparin Continue to monitor H&H and platelets  Isac Sarna, BS Vena [place], BCPS Clinical Pharmacist Pager [phone] 11/17/2016,7:42 AM

## 2016-11-18 ENCOUNTER — Inpatient Hospital Stay (HOSPITAL_COMMUNITY): Payer: Medicare Other

## 2016-11-18 ENCOUNTER — Other Ambulatory Visit: Payer: Self-pay

## 2016-11-18 DIAGNOSIS — I4891 Unspecified atrial fibrillation: Secondary | ICD-10-CM

## 2016-11-18 LAB — PROTIME-INR
INR: 1.58
Prothrombin Time: 19 seconds — ABNORMAL HIGH (ref 11.4–15.2)

## 2016-11-18 LAB — BASIC METABOLIC PANEL
Anion gap: 8 (ref 5–15)
BUN: 31 mg/dL — AB (ref 6–20)
CHLORIDE: 105 mmol/L (ref 101–111)
CO2: 21 mmol/L — AB (ref 22–32)
CREATININE: 2.86 mg/dL — AB (ref 0.61–1.24)
Calcium: 7.9 mg/dL — ABNORMAL LOW (ref 8.9–10.3)
GFR calc Af Amer: 28 mL/min — ABNORMAL LOW (ref >60)
GFR calc non Af Amer: 24 mL/min — ABNORMAL LOW (ref >60)
Glucose, Bld: 102 mg/dL — ABNORMAL HIGH (ref 65–99)
POTASSIUM: 3.6 mmol/L (ref 3.5–5.1)
SODIUM: 134 mmol/L — AB (ref 135–145)

## 2016-11-18 LAB — ECHOCARDIOGRAM COMPLETE
Height: 74 in
Weight: 4423.31 oz

## 2016-11-18 LAB — HEPARIN LEVEL (UNFRACTIONATED): HEPARIN UNFRACTIONATED: 0.45 [IU]/mL (ref 0.30–0.70)

## 2016-11-18 MED ORDER — SALINE SPRAY 0.65 % NA SOLN: 1.0000 | NASAL | Status: DC | PRN

## 2016-11-18 MED ORDER — ALUM & MAG HYDROXIDE-SIMETH 200-200-20 MG/5ML PO SUSP
30.0000 mL | ORAL | Status: DC | PRN
  Administered 2016-11-18 – 2016-11-23 (×5): 30 mL via ORAL
  Filled 2016-11-18 (×5): qty 30

## 2016-11-18 MED ORDER — AMIODARONE LOAD VIA INFUSION
150.0000 mg | Freq: Once | INTRAVENOUS | Status: AC
  Administered 2016-11-18: 150 mg via INTRAVENOUS
  Filled 2016-11-18: qty 83.34

## 2016-11-18 NOTE — Progress Notes (Signed)
Physical Therapy Treatment Patient Details Name: [name] MRN: [id number] DOB: [birth year] Today's Date: 11/18/2016    History of Present Illness  Yerik Zeringue  is a 51 y.o. male, w Atrial fibrillation , Hypertension, CHF ? EF, CAD, renal transplant who presented with c/o fever x1 day,  Pt notes that his left leg been swollen x4 days.  Pt had evaluation in ED, at [facility].  Per pt no blood clot.  Pt sent home.     PT Comments    Pt friendly supine in bed upon therapist entrance and willing to participate.  Reports of decreased pain Lt knee overall and has been working on mobility in bed.  Noted blister on back of calf busted, RN informed and dressings attached for drainage.  Pt motivated to get out of bed though did c/o increased pain with weight bearing.  Min A with with gait training from bed to chair with cueing to improve knee extension and weight bearing with stance phase to improve gait mechanics.  Pt left in chair at EOS with LE elevated and reports of pain resolved with NWB.  Call bell within reach and wife in room.    Follow Up Recommendations        Equipment Recommendations       Recommendations for Other Services       Precautions / Restrictions Precautions Precautions: Fall Restrictions Weight Bearing Restrictions: Yes LLE Weight Bearing: Weight bearing as tolerated    Mobility  Bed Mobility Overal bed mobility: Modified Independent             General bed mobility comments: slow independent supine to sit;  noted reports of sweating once sitting on EOB  Transfers Overall transfer level: Needs assistance Equipment used: Rolling walker (2 wheeled) Transfers: Sit to/from Stand Sit to Stand: Min assist         General transfer comment: increased pain scale from 0-->5-7/10 with weight bearing, pt tolerated and continued with transfer with   Ambulation/Gait Ambulation/Gait assistance: Min assist Ambulation Distance (Feet): 3 Feet Assistive  device: Rolling walker (2 wheeled) Gait Pattern/deviations: Decreased weight shift to left         Stairs            Wheelchair Mobility    Modified Rankin (Stroke Patients Only)       Balance                                    Cognition Arousal/Alertness: Awake/alert Behavior During Therapy: WFL for tasks assessed/performed Overall Cognitive Status: Within Functional Limits for tasks assessed                      Exercises General Exercises - Lower Extremity Ankle Circles/Pumps: Both;10 reps Quad Sets: Both;10 reps Short Arc Quad: 10 reps;Both;Supine Heel Slides: Both;5 reps    General Comments        Pertinent Vitals/Pain Pain Assessment: 0-10 Pain Score:  (0 NWB increase to 7/10 with WB) Pain Descriptors / Indicators: Aching Pain Intervention(s): Limited activity within patient's tolerance    Home Living                      Prior Function            PT Goals (current goals can now be found in the care plan section) Acute Rehab PT Goals Patient Stated Goal: to walk  PT Goal Formulation: With patient Progress towards PT goals: Progressing toward goals    Frequency           PT Plan Current plan remains appropriate    Co-evaluation             End of Session Equipment Utilized During Treatment: Gait belt Activity Tolerance: Patient tolerated treatment well;Patient limited by pain Patient left: in chair;with call bell/phone within reach;with family/visitor present     Time: 1400-1440 PT Time Calculation (min) (ACUTE ONLY): 40 min  Charges:  $Gait Training: 8-22 mins $Therapeutic Exercise: 8-22 mins $Therapeutic Activity: 8-22 mins                    G Codes:     Ihor Austin, Barrett; [place]  Aldona Lento 11/18/2016, 3:14 PM
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
## 2016-11-18 NOTE — Progress Notes (Signed)
L calf circumference 47.5cm

## 2016-11-18 NOTE — Care Management Note (Signed)
Case Management Note  Patient Details  Name: [name] MRN: [id number] Date of Birth: 1966/08/08  Subjective/Objective:                  Patient adm from home with Afib with RVR. Lives with wife, ind with ADL's. Has PCP in [place] and reports no issues affording medications. Recommended for [facility] PT.   Action/Plan: Patient agreeable to [facility] PT, Offered choice of [place] agencies. Romualdo Bolk of [facility] notified and will obtain orders from chart. Patient aware that [facility] has 48 hours to intiate services.    Expected Discharge Date:        11/19/2016          Expected Discharge Plan:  [place]  In-House Referral:  NA  Discharge planning Services  CM Consult  Post Acute Care Choice:  Home Health Choice offered to:  Patient  DME Arranged:    DME Agency:     HH Arranged:  PT [place]:  [place]  Status of Service:  In process, will continue to follow  If discussed at Long Length of Stay Meetings, dates discussed:    Additional Comments:  Errin Whitelaw, Chauncey Reading, RN 11/18/2016, 1:18 PM

## 2016-11-18 NOTE — Progress Notes (Signed)
L calf circumference 47cm

## 2016-11-18 NOTE — Progress Notes (Signed)
Subjective:    No palpitations. No chest pain or SOB.,   Objective:   Temp:  [98.8 F (37.1 C)-99.5 F (37.5 C)] 99.5 F (37.5 C) (01/04 0721) Pulse Rate:  [50-142] 123 (01/04 0800) Resp:  [18-45] 41 (01/04 0300) BP: (82-177)/(47-116) 153/111 (01/04 0800) SpO2:  [82 %-100 %] 97 % (01/04 0800) Weight:  [276 lb 7.3 oz (125.4 kg)] 276 lb 7.3 oz (125.4 kg) (01/04 0420) Last BM Date: 11/18/16  Filed Weights   11/16/16 0500 11/17/16 0456 11/18/16 0420  Weight: 277 lb 12.5 oz (126 kg) 278 lb 7.1 oz (126.3 kg) 276 lb 7.3 oz (125.4 kg)    Intake/Output Summary (Last 24 hours) at 11/18/16 0926 Last data filed at 11/18/16 0700  Gross per 24 hour  Intake          2225.44 ml  Output             1300 ml  Net           925.44 ml    Telemetry: narrow complex regular tachycardia  Exam:  General: NAD  HEENT: sclera clear, throat clear  Resp: CTAB  Cardiac: regular, rate 100, no m/r/g, no jvd  RS:WNIOEVO soft, NT, ND  MSK: 2+ LLE edema  Neuro: no focal deficits  Psych: appropriate affect  Lab Results:  Basic Metabolic Panel:  Recent Labs Lab 11/16/16 0427 11/16/16 1027 11/18/16 0442  NA 135 134* 134*  K 4.1 4.0 3.6  CL 105 102 105  CO2 22 24 21*  GLUCOSE 92 127* 102*  BUN 23* 22* 31*  CREATININE 2.46* 2.54* 2.86*  CALCIUM 8.2* 8.2* 7.9*    Liver Function Tests:  Recent Labs Lab 11/15/16 0205 11/15/16 1526 11/16/16 1027  AST 36 38 41  ALT 19 21 21   ALKPHOS 93 98 100  BILITOT 2.5* 1.8* 1.4*  PROT 6.9 6.4* 6.4*  ALBUMIN 3.4* 3.1* 2.9*    CBC:  Recent Labs Lab 11/15/16 1526 11/16/16 0427 11/17/16 0606  WBC 12.3* 13.1* 13.4*  HGB 10.2* 10.4* 9.7*  HCT 33.7* 34.6* 31.6*  MCV 91.8 91.3 90.0  PLT 188 188 192    Cardiac Enzymes:  Recent Labs Lab 11/15/16 1526 11/15/16 2121 11/16/16 0427  TROPONINI 0.09* 0.09* 0.08*    BNP: No results for input(s): PROBNP in the last 8760 hours.  Coagulation:  Recent Labs Lab 11/16/16 0427  11/17/16 0606 11/18/16 0442  INR 1.77 1.73 1.58    ECG:   Medications:   Scheduled Medications: . aspirin  81 mg Oral Daily  . atorvastatin  80 mg Oral q1800  . calcitRIOL  0.5 mcg Oral Daily  . doxycycline (VIBRAMYCIN) IV  100 mg Intravenous Q12H  . ferrous sulfate  325 mg Oral Q breakfast  . furosemide  40 mg Oral Daily  . magnesium oxide  200 mg Oral Daily  . metoprolol tartrate  100 mg Oral BID  . multivitamin with minerals  1 tablet Oral Daily  . pantoprazole  40 mg Oral Daily  . piperacillin-tazobactam (ZOSYN)  IV  3.375 g Intravenous Q8H  . predniSONE  10 mg Oral Q breakfast  . tacrolimus  1 mg Oral BID  . tamsulosin  0.4 mg Oral Daily     Infusions: . amiodarone 60 mg/hr (11/18/16 0700)  . diltiazem (CARDIZEM) infusion 5 mg/hr (11/16/16 1014)  . heparin 2,000 Units/hr (11/18/16 0700)     PRN Medications:  acetaminophen, HYDROcodone-acetaminophen     Assessment/Plan    1. Afib -  known history of afib, prior blation Dec 2016 in [place] - developed afib with RVR in setting of infected left leg - started on IV amiodarone. Currently on heparin gtt, coumadin on hold - on amio gtt, dilt gtt, lopressor 100mg  bid - amio gtt started 11/16/15. Rate increased to 60mg /hr yesterday.  - at least some episodes of afib. Also narrow complex regular tachycardia at 120, based on limited tele leads difficult to tell if aflutter. We will obtain 12 lead this AM, medication adjustements pending 12 leads. Likely would rebolus amio 150 and lower rate back to 30mg /hr.    2. Elevated troponin - mild flat elevation in setting of infection and afib with RVR, suspect demand ischemia - from prior notes patient reports recent stress test was "okay" - echo pending  3. CKD IV with previous renal transplant     Carlyle Dolly, M.D.

## 2016-11-18 NOTE — Progress Notes (Signed)
*  PRELIMINARY RESULTS* Echocardiogram 2D Echocardiogram has been performed.  [name] 11/18/2016, 11:57 AM

## 2016-11-18 NOTE — Progress Notes (Signed)
ANTICOAGULATION CONSULT NOTE -  Pharmacy Consult for Heparin Indication: VTE treatment  Allergies  Allergen Reactions  . Vancomycin Swelling    Patient Measurements: Height: 6\' 2"  (188 cm) Weight: 276 lb 7.3 oz (125.4 kg) IBW/kg (Calculated) : 82.2 HEPARIN DW (KG): 109.3  Vital Signs: Temp: 99 F (37.2 C) (01/04 0310) Temp Source: Oral (01/04 0310) BP: 155/112 (01/04 0600) Pulse Rate: 121 (01/04 0600)  Labs:  Recent Labs  11/15/16 1526 11/15/16 2121 11/16/16 0427 11/16/16 1027  11/17/16 0606 11/17/16 1457 11/17/16 2153 11/18/16 0442  HGB 10.2*  --  10.4*  --   --  9.7*  --   --   --   HCT 33.7*  --  34.6*  --   --  31.6*  --   --   --   PLT 188  --  188  --   --  192  --   --   --   LABPROT 24.1*  --  20.8*  --   --  20.4*  --   --  19.0*  INR 2.12  --  1.77  --   --  1.73  --   --  1.58  HEPARINUNFRC  --   --  0.17*  --   < > 0.67 0.87* 0.70 0.45  CREATININE 2.31*  --  2.46* 2.54*  --   --   --   --  2.86*  TROPONINI 0.09* 0.09* 0.08*  --   --   --   --   --   --   < > = values in this interval not displayed.  Estimated Creatinine Clearance: 44.5 mL/min (by C-G formula based on SCr of 2.86 mg/dL (H)).   Medical History: Past Medical History:  Diagnosis Date  . Atrial fibrillation ([place])   . CHF (congestive heart failure) ([place])   . Coronary artery disease   . Hypertension   . Kidney transplanted     Medications:  Prescriptions Prior to Admission  Medication Sig Dispense Refill Last Dose  . atorvastatin (LIPITOR) 80 MG tablet Take 80 mg by mouth daily.   11/14/2016 at Unknown time  . calcitRIOL (ROCALTROL) 0.5 MCG capsule Take 0.5 mcg by mouth daily.   11/14/2016 at Unknown time  . calcium carbonate 1250 MG capsule Take 1,800 mg by mouth 2 (two) times daily with a meal.   11/14/2016 at Unknown time  . furosemide (LASIX) 40 MG tablet Take 120 mg by mouth daily as needed for fluid.    unknown  . magnesium oxide (MAG-OX) 400 MG tablet Take 800 mg by mouth 2  (two) times daily.    11/14/2016 at Unknown time  . metoprolol succinate (TOPROL-XL) 25 MG 24 hr tablet Take 75 mg by mouth 2 (two) times daily.    11/14/2016 at 2100  . Multiple Vitamins-Minerals (MULTIVITAMIN WITH MINERALS) tablet Take 1 tablet by mouth daily.   11/14/2016 at Unknown time  . omeprazole (PRILOSEC) 40 MG capsule Take 40 mg by mouth daily.   11/14/2016 at Unknown time  . predniSONE (DELTASONE) 10 MG tablet Take 10 mg by mouth daily with breakfast.   11/14/2016 at Unknown time  . tacrolimus (PROGRAF) 1 MG capsule Take 1 mg by mouth 2 (two) times daily.   11/14/2016 at Unknown time  . tamsulosin (FLOMAX) 0.4 MG CAPS capsule Take 0.4 mg by mouth daily.    11/14/2016 at Unknown time  . warfarin (COUMADIN) 6 MG tablet Take 6 mg by mouth daily. 6 mg  Sat/Sun,  7 mg Mon-Friday   11/14/2016 at 2100    Assessment: 51 y.o. male admitted on 11/15/2016 with deep tissue abscess and fever. Patient has history of afib, HTN, EF CAD, and renal transplant. Pt noted left leg swollen for 4 days.  Asked to initiate Heparin for VTE treatment, no bolus per MD.  Pt did receive Vitamin K 2mg  IV in ED.  INR therapeutic on admission. Heparin level is therapeutic.  No bleeding reported.  Goal of Therapy:  Heparin level 0.3-0.7 units/ml Monitor platelets by anticoagulation protocol: Yes   Plan:  Continue Heparinato  2000 units/hr Check anti-Xa level daily while on heparin Continue to monitor H&H and platelets  Isac Sarna, BS [place]ena [place], BCPS Clinical Pharmacist Pager [phone] 11/18/2016,6:43 AM

## 2016-11-18 NOTE — Progress Notes (Signed)
L calf circumference 47 cm

## 2016-11-19 LAB — BASIC METABOLIC PANEL
Anion gap: 10 (ref 5–15)
BUN: 34 mg/dL — ABNORMAL HIGH (ref 6–20)
CALCIUM: 7.9 mg/dL — AB (ref 8.9–10.3)
CO2: 22 mmol/L (ref 22–32)
CREATININE: 3.07 mg/dL — AB (ref 0.61–1.24)
Chloride: 101 mmol/L (ref 101–111)
GFR calc non Af Amer: 22 mL/min — ABNORMAL LOW (ref >60)
GFR, EST AFRICAN AMERICAN: 26 mL/min — AB (ref >60)
Glucose, Bld: 101 mg/dL — ABNORMAL HIGH (ref 65–99)
Potassium: 3.7 mmol/L (ref 3.5–5.1)
SODIUM: 133 mmol/L — AB (ref 135–145)

## 2016-11-19 LAB — CREATININE, URINE, RANDOM: Creatinine, Urine: 164.26 mg/dL

## 2016-11-19 LAB — CBC
HCT: 29.2 % — ABNORMAL LOW (ref 39.0–52.0)
HEMOGLOBIN: 8.8 g/dL — AB (ref 13.0–17.0)
MCH: 27 pg (ref 26.0–34.0)
MCHC: 30.1 g/dL (ref 30.0–36.0)
MCV: 89.6 fL (ref 78.0–100.0)
PLATELETS: 247 10*3/uL (ref 150–400)
RBC: 3.26 MIL/uL — AB (ref 4.22–5.81)
RDW: 16.5 % — ABNORMAL HIGH (ref 11.5–15.5)
WBC: 14.3 10*3/uL — AB (ref 4.0–10.5)

## 2016-11-19 LAB — PROTIME-INR
INR: 1.67
Prothrombin Time: 19.9 seconds — ABNORMAL HIGH (ref 11.4–15.2)

## 2016-11-19 LAB — RETICULOCYTES
RBC.: 3.43 MIL/uL — AB (ref 4.22–5.81)
RETIC COUNT ABSOLUTE: 54.9 10*3/uL (ref 19.0–186.0)
RETIC CT PCT: 1.6 % (ref 0.4–3.1)

## 2016-11-19 LAB — HEPARIN LEVEL (UNFRACTIONATED): Heparin Unfractionated: 0.56 IU/mL (ref 0.30–0.70)

## 2016-11-19 LAB — PROTEIN, URINE, RANDOM: Total Protein, Urine: 39 mg/dL

## 2016-11-19 LAB — FOLATE: FOLATE: 14.8 ng/mL (ref >5.9)

## 2016-11-19 MED ORDER — MYCOPHENOLATE MOFETIL 250 MG PO CAPS
750.0000 mg | ORAL_CAPSULE | Freq: Two times a day (BID) | ORAL | Status: DC
  Administered 2016-11-19 – 2016-11-25 (×13): 750 mg via ORAL
  Filled 2016-11-19 (×15): qty 3

## 2016-11-19 MED ORDER — DOXYCYCLINE HYCLATE 100 MG PO TABS: 100.0000 mg | ORAL_TABLET | Freq: Two times a day (BID) | ORAL | Status: DC

## 2016-11-19 MED ORDER — AMIODARONE HCL 200 MG PO TABS
400.0000 mg | ORAL_TABLET | Freq: Two times a day (BID) | ORAL | Status: DC
  Administered 2016-11-19 – 2016-11-25 (×13): 400 mg via ORAL
  Filled 2016-11-19 (×13): qty 2

## 2016-11-19 MED ORDER — HYDRALAZINE HCL 20 MG/ML IJ SOLN
5.0000 mg | Freq: Four times a day (QID) | INTRAMUSCULAR | Status: DC | PRN
  Administered 2016-11-19: 5 mg via INTRAVENOUS
  Filled 2016-11-19: qty 1

## 2016-11-19 MED ORDER — HYDROCODONE-ACETAMINOPHEN 5-325 MG PO TABS
1.0000 | ORAL_TABLET | ORAL | Status: DC | PRN
  Administered 2016-11-19 – 2016-11-20 (×2): 2 via ORAL
  Administered 2016-11-20: 1 via ORAL
  Administered 2016-11-20 – 2016-11-23 (×5): 2 via ORAL
  Filled 2016-11-19 (×2): qty 2
  Filled 2016-11-19: qty 1
  Filled 2016-11-19 (×7): qty 2

## 2016-11-19 MED ORDER — DOXYCYCLINE HYCLATE 100 MG PO TABS
100.0000 mg | ORAL_TABLET | Freq: Two times a day (BID) | ORAL | Status: AC
  Administered 2016-11-19 – 2016-11-21 (×4): 100 mg via ORAL
  Filled 2016-11-19 (×4): qty 1

## 2016-11-19 NOTE — Care Management Important Message (Signed)
Important Message  Patient Details  Name: [name] MRN: [id number] Date of Birth: June 13, 1966   Medicare Important Message Given:  Yes    Romayne Ticas, Chauncey Reading, RN 11/19/2016, 4:54 PM

## 2016-11-19 NOTE — Progress Notes (Signed)
Patient has home CPAP. Refilled sterile water. Patient places on himself. RT will continue to monitor.

## 2016-11-19 NOTE — Consult Note (Addendum)
[name] MRN: [id number] DOB/AGE: 16-Aug-1966 51 y.o. Primary Care Physician:No PCP Per Patient Admit date: 11/15/2016 Chief Complaint:  Chief Complaint  Patient presents with  . Fever   HPI:JPt is a 51 year old  male with past medical hx of  Atrial fibrillation , Hypertension, Renal transplant who presented with c/o fever x1 day,  HPI dates back to 4-5 days agi when pt noticed that his left leg been swollen.  Pt had evaluation in ED, at [facility].  Pt later had  Fever and came to [facility]. Upon evaluation in ER pt was thought  to have cellulitis by ED . Pt was admitted for Afib with RVR and cellulitis.  Pt later had rise in creat and nephrology was consulted. Pt seen in ICU Pt offer no c/o sore throat. No ear ache, NO  C/o  Cough. NO c/o chest pain. Pt says " I am getting better"    Past Medical History:  Diagnosis Date  . Atrial fibrillation ([place])   . CHF (congestive heart failure) ([place])   . Coronary artery disease   . Hypertension   . Kidney transplanted    Cadaveric renal transplant done 13 years ago at [place] , [place].     Family History  Problem Relation Age of Onset  . Hypertension Mother   . Heart attack Father     Social History:  reports that he has never smoked. He has never used smokeless tobacco. He reports that he does not drink alcohol. His drug history is not on file.   Allergies:  Allergies  Allergen Reactions  . Vancomycin Swelling    Medications Prior to Admission  Medication Sig Dispense Refill  . atorvastatin (LIPITOR) 80 MG tablet Take 80 mg by mouth daily.    . calcitRIOL (ROCALTROL) 0.5 MCG capsule Take 0.5 mcg by mouth daily.    . calcium carbonate 1250 MG capsule Take 1,800 mg by mouth 2 (two) times daily with a meal.    . furosemide (LASIX) 40 MG tablet Take 120 mg by mouth daily as needed for fluid.     . magnesium oxide (MAG-OX) 400 MG tablet Take 800 mg by mouth 2 (two) times daily.     . metoprolol succinate  (TOPROL-XL) 25 MG 24 hr tablet Take 75 mg by mouth 2 (two) times daily.     . Multiple Vitamins-Minerals (MULTIVITAMIN WITH MINERALS) tablet Take 1 tablet by mouth daily.    Marland Kitchen omeprazole (PRILOSEC) 40 MG capsule Take 40 mg by mouth daily.    . predniSONE (DELTASONE) 10 MG tablet Take 10 mg by mouth daily with breakfast.    . tacrolimus (PROGRAF) 1 MG capsule Take 1 mg by mouth 2 (two) times daily.    . tamsulosin (FLOMAX) 0.4 MG CAPS capsule Take 0.4 mg by mouth daily.     Marland Kitchen warfarin (COUMADIN) 6 MG tablet Take 6 mg by mouth daily. 6 mg Sat/Sun,  7 mg Mon-Friday         BSW:HQPRF from the symptoms mentioned above,there are no other symptoms referable to all systems reviewed.  Marland Kitchen amiodarone  400 mg Oral BID  . aspirin  81 mg Oral Daily  . atorvastatin  80 mg Oral q1800  . calcitRIOL  0.5 mcg Oral Daily  . doxycycline (VIBRAMYCIN) IV  100 mg Intravenous Q12H  . ferrous sulfate  325 mg Oral Q breakfast  . furosemide  40 mg Oral Daily  . magnesium oxide  200 mg Oral Daily  .  metoprolol tartrate  100 mg Oral BID  . multivitamin with minerals  1 tablet Oral Daily  . pantoprazole  40 mg Oral Daily  . piperacillin-tazobactam (ZOSYN)  IV  3.375 g Intravenous Q8H  . predniSONE  10 mg Oral Q breakfast  . tacrolimus  1 mg Oral BID  . tamsulosin  0.4 mg Oral Daily       Physical Exam: Vital signs in last 24 hours: Temp:  [98.2 F (36.8 C)-99.5 F (37.5 C)] 99.5 F (37.5 C) (01/05 1100) Pulse Rate:  [115-125] 123 (01/05 1100) Resp:  [17-27] 27 (01/04 1900) BP: (78-191)/(20-126) 191/126 (01/05 1000) SpO2:  [88 %-100 %] 91 % (01/05 1100) Weight:  [284 lb 13.4 oz (129.2 kg)] 284 lb 13.4 oz (129.2 kg) (01/05 0517) Weight change: 8 lb 6 oz (3.8 kg) Last BM Date: 11/19/16  Intake/Output from previous day: 01/04 0701 - 01/05 0700 In: 1903.6 [P.O.:600; I.V.:653.6; IV Piggyback:650] Out: 950 [Urine:950] Total I/O In: -  Out: 250 [Urine:250]   Physical Exam: General- pt is  awake,alert, oriented to time place and person Resp- No acute REsp distress, CTA B/L NO Rhonchi CVS- S1S2 irregular in rate and rhythm GIT- BS+, soft, NT, ND EXT- NO LE Edema on right leg but left leg edema and erythema present ,No Cyanosis CNS- CN 2-12 grossly intact. Moving all 4 extremities Psych- normal mood and affect Access- AVF   Lab Results: CBC  Recent Labs  11/17/16 0606 11/19/16 0556  WBC 13.4* 14.3*  HGB 9.7* 8.8*  HCT 31.6* 29.2*  PLT 192 247    BMET  Recent Labs  11/18/16 0442 11/19/16 0556  NA 134* 133*  K 3.6 3.7  CL 105 101  CO2 21* 22  GLUCOSE 102* 101*  BUN 31* 34*  CREATININE 2.86* 3.07*  CALCIUM 7.9* 7.9*    Creat trend 2018  2.4=>3.0    MICRO Recent Results (from the past 240 hour(s))  Blood Culture (routine x 2)     Status: None (Preliminary result)   Collection Time: 11/15/16  2:05 AM  Result Value Ref Range Status   Specimen Description BLOOD RIGHT ANTECUBITAL  Final   Special Requests BOTTLES DRAWN AEROBIC AND ANAEROBIC 10CC EACH  Final   Culture NO GROWTH 4 DAYS  Final   Report Status PENDING  Incomplete  Blood Culture (routine x 2)     Status: None (Preliminary result)   Collection Time: 11/15/16  2:18 AM  Result Value Ref Range Status   Specimen Description BLOOD RIGHT FOREARM  Final   Special Requests BOTTLES DRAWN AEROBIC AND ANAEROBIC [place]  Final   Culture NO GROWTH 4 DAYS  Final   Report Status PENDING  Incomplete  Urine culture     Status: Abnormal   Collection Time: 11/15/16  3:30 AM  Result Value Ref Range Status   Specimen Description URINE, CLEAN CATCH  Final   Special Requests NONE  Final   Culture (A)  Final    <10,000 COLONIES/mL INSIGNIFICANT GROWTH Performed at [facility]    Report Status 11/16/2016 FINAL  Final  MRSA PCR Screening     Status: None   Collection Time: 11/15/16  3:22 PM  Result Value Ref Range Status   MRSA by PCR NEGATIVE NEGATIVE Final    Comment:        The GeneXpert  MRSA Assay (FDA approved for NASAL specimens only), is one component of a comprehensive MRSA colonization surveillance program. It is not intended to diagnose  MRSA infection nor to guide or monitor treatment for MRSA infections.       Lab Results  Component Value Date   CALCIUM 7.9 (L) 11/19/2016      Impression: 1)Renal  AKI secondary to ATN                AKI sec to SIRS/ hemodynamic instability. Vs CKD progression               AKI on CKD               CKD stage 3/4.               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                            Renal transplant                   Normally its 3 drug regimen                  Tacro                   Steriods                   Mycophenolate                     Pt is on Tacro and steriods but not on Mycophenolate                    Pt says at home he is on 750mg  po BID   2) HTN BP not  at goal  Medication- On Diuretics On Calcium channel blockers On Vasodilators  3)Anemia HGb at goal (9--11)   4)CKD Mineral-Bone Disorder PTH not avail Secondary Hyperparathyroidism w/u pending. Phosphorus will check.    5)ID-admitted with cellulitis PMD following  6)Electrolytes  Normokalemic  Hyponatremic    minimal  7)Acid base Co2 at goal  8) CVS- admitted kith Afib w RVR     On IV heparin and Cardizem     Cardiology and primary team is following   Plan:  Will  Start on Mycophenolate Will ask for Phos Will ask for spot protein/creat ratio Will ask for renal u/s Will ask for prograf levels Will suggest to get data from [name] for his transplant related and CKD data      [name] S 11/19/2016, 2:17 PM
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
## 2016-11-19 NOTE — Progress Notes (Addendum)
Subjective:    Left leg pain this AM  Objective:   Temp:  [98.2 F (36.8 C)-99.6 F (37.6 C)] 98.2 F (36.8 C) (01/05 0000) Pulse Rate:  [110-125] 121 (01/05 0600) Resp:  [17-27] 27 (01/04 1900) BP: (78-153)/(20-111) 116/62 (01/05 0600) SpO2:  [88 %-100 %] 96 % (01/05 0600) Weight:  [284 lb 13.4 oz (129.2 kg)] 284 lb 13.4 oz (129.2 kg) (01/05 0517) Last BM Date: 11/18/16  Filed Weights   11/17/16 0456 11/18/16 0420 11/19/16 0517  Weight: 278 lb 7.1 oz (126.3 kg) 276 lb 7.3 oz (125.4 kg) 284 lb 13.4 oz (129.2 kg)    Intake/Output Summary (Last 24 hours) at 11/19/16 0758 Last data filed at 11/19/16 0600  Gross per 24 hour  Intake           1903.6 ml  Output              950 ml  Net            953.6 ml    Telemetry: narrow complex regular tachycardia at 120  Exam:  General: NAD  HEENT: sclera clear, throat clear  Resp: CTAB  Cardiac: Regular, no m/r/g, no jvd  GI: abdomen soft, NT, ND  MSK: left leg edema  Neuro: no focal deficits  Psych: appropriate affect  Lab Results:  Basic Metabolic Panel:  Recent Labs Lab 11/16/16 1027 11/18/16 0442 11/19/16 0556  NA 134* 134* 133*  K 4.0 3.6 3.7  CL 102 105 101  CO2 24 21* 22  GLUCOSE 127* 102* 101*  BUN 22* 31* 34*  CREATININE 2.54* 2.86* 3.07*  CALCIUM 8.2* 7.9* 7.9*    Liver Function Tests:  Recent Labs Lab 11/15/16 0205 11/15/16 1526 11/16/16 1027  AST 36 38 41  ALT 19 21 21   ALKPHOS 93 98 100  BILITOT 2.5* 1.8* 1.4*  PROT 6.9 6.4* 6.4*  ALBUMIN 3.4* 3.1* 2.9*    CBC:  Recent Labs Lab 11/16/16 0427 11/17/16 0606 11/19/16 0556  WBC 13.1* 13.4* 14.3*  HGB 10.4* 9.7* 8.8*  HCT 34.6* 31.6* 29.2*  MCV 91.3 90.0 89.6  PLT 188 192 247    Cardiac Enzymes:  Recent Labs Lab 11/15/16 1526 11/15/16 2121 11/16/16 0427  TROPONINI 0.09* 0.09* 0.08*    BNP: No results for input(s): PROBNP in the last 8760 hours.  Coagulation:  Recent Labs Lab 11/17/16 0606 11/18/16 0442  11/19/16 0556  INR 1.73 1.58 1.67    ECG:   Medications:   Scheduled Medications: . aspirin  81 mg Oral Daily  . atorvastatin  80 mg Oral q1800  . calcitRIOL  0.5 mcg Oral Daily  . doxycycline (VIBRAMYCIN) IV  100 mg Intravenous Q12H  . ferrous sulfate  325 mg Oral Q breakfast  . furosemide  40 mg Oral Daily  . magnesium oxide  200 mg Oral Daily  . metoprolol tartrate  100 mg Oral BID  . multivitamin with minerals  1 tablet Oral Daily  . pantoprazole  40 mg Oral Daily  . piperacillin-tazobactam (ZOSYN)  IV  3.375 g Intravenous Q8H  . predniSONE  10 mg Oral Q breakfast  . tacrolimus  1 mg Oral BID  . tamsulosin  0.4 mg Oral Daily     Infusions: . amiodarone 30 mg/hr (11/19/16 0600)  . diltiazem (CARDIZEM) infusion 5 mg/hr (11/16/16 1014)  . heparin 2,000 Units/hr (11/19/16 0600)     PRN Medications:  acetaminophen, alum & mag hydroxide-simeth, HYDROcodone-acetaminophen, sodium chloride  Assessment/Plan   1. Afib - known history of afib, prior ablation Dec 2016 in [place] - developed afib with RVR in setting of infected left leg this admission - started on IV amiodarone. Currently on heparin gtt, coumadin on hold - on amio gtt, lopressor 100mg  bid - no recurrent afib. Rhythm this morning narrow complex regular at 120, no discernible P waves. Either sinus tach with long PR with P wave hidden in Twave or accelerated junctional rhythm. Will obtain extended 12 lead rhythm strips - change amio to 400mg  bid. Continue oral lopressor.    2. Elevated troponin - mild flat elevation in setting of infection and afib with RVR, suspect demand ischemia - from prior notes patient reports recent stress test was "okay" - echo with LVEF 60-65%, severe LVH - no plans for ischemic testing at this time.   3. CKD IV with previous renal transplant - uptrend in Cr. With his significant history consider nephrology consult. Patient asking for additional lasix similar to his  home regimen, I am hesitant to give at this time given his worsening renal function. I would defer to nephrology. He does not appear significantly volume overloaded.    4. Leg infection - per primary team    Carlyle Dolly, M.D., F.A.C.C.[name], male   DOB: 15-Jul-1966, 51 y.o.   MRN: [id number]   Addendum 300pm Extended 12 lead rhythm strip reviewed. Somewhat difficult rhythm to distinguish, low P wave amplitudes. I believe the rhythm is sinus tachycardia, with low voltage P waves best visible in III and V2. Continue current meds, sinus tach likely related to ongoing pain and infection.    Zandra Abts MD

## 2016-11-19 NOTE — Progress Notes (Signed)
Per nightshift Respiratory therapist patient did not use his home CPAP the night of 11/18/16.

## 2016-11-19 NOTE — Progress Notes (Signed)
Pharmacy Antibiotic Note  [name] is a 51 y.o. male admitted on 11/15/2016 with deep tissue abscess.  Pharmacy has been consulted for zosyn dosing. Day#5 of Zosyn. most likely has infected Baker's cyst or infected hematoma in his left calf with early sepsis. MRI of the left leg, does not show any evidence of abscess. Patient is improving Plan: Zosyn 3.375g IV q8h (4 hour infusion).  Monitor progress, labs, c/s Transition to oral therapy as indicated  Height: 6\' 2"  (188 cm) Weight: 284 lb 13.4 oz (129.2 kg) IBW/kg (Calculated) : 82.2  Temp (24hrs), Avg:99.1 F (37.3 C), Min:98.2 F (36.8 C), Max:99.6 F (37.6 C)   Recent Labs Lab 11/15/16 0205 11/15/16 0343 11/15/16 1526 11/16/16 0427 11/16/16 1027 11/17/16 0606 11/18/16 0442 11/19/16 0556  WBC 13.1*  --  12.3* 13.1*  --  13.4*  --  14.3*  CREATININE 2.48*  --  2.31* 2.46* 2.54*  --  2.86* 3.07*  LATICACIDVEN  --  1.76  --   --   --   --   --   --     Estimated Creatinine Clearance: 42 mL/min (by C-G formula based on SCr of 3.07 mg/dL (H)).    Allergies  Allergen Reactions  . Vancomycin Swelling    Antimicrobials this admission: Zosyn 1/1 >> Doxycycline 1/1>>  Dose adjustments this admission: n/a  Microbiology results: 1/1 BCx: ngtd  1/1 UCx: insignficant growth   1/1 MRSA PCR: negative  Thank you for allowing pharmacy to be a part of this patient's care. Isac Sarna, BS Pharm D, [place] Clinical Pharmacist Pager [phone] 11/19/2016 8:38 AM

## 2016-11-19 NOTE — Progress Notes (Signed)
1617 Patient's BP 185/24, HR 120, O2 90%. MD notified and orders given to give patient PRN Hydralazine and increase O2 to 3L [place].

## 2016-11-19 NOTE — Progress Notes (Signed)
ANTICOAGULATION CONSULT NOTE -  Pharmacy Consult for Heparin Indication: VTE treatment  Allergies  Allergen Reactions  . Vancomycin Swelling    Patient Measurements: Height: 6\' 2"  (188 cm) Weight: 284 lb 13.4 oz (129.2 kg) IBW/kg (Calculated) : 82.2 HEPARIN DW (KG): 109.3  Vital Signs: Temp: 98.2 F (36.8 C) (01/05 0000) Temp Source: Oral (01/05 0000) BP: 116/62 (01/05 0600) Pulse Rate: 121 (01/05 0600)  Labs:  Recent Labs  11/16/16 1027  11/17/16 0606  11/17/16 2153 11/18/16 0442 11/19/16 0556  HGB  --   --  9.7*  --   --   --  8.8*  HCT  --   --  31.6*  --   --   --  29.2*  PLT  --   --  192  --   --   --  247  LABPROT  --   --  20.4*  --   --  19.0* 19.9*  INR  --   --  1.73  --   --  1.58 1.67  HEPARINUNFRC  --   < > 0.67  < > 0.70 0.45 0.56  CREATININE 2.54*  --   --   --   --  2.86* 3.07*  < > = values in this interval not displayed.  Estimated Creatinine Clearance: 42 mL/min (by C-G formula based on SCr of 3.07 mg/dL (H)).   Medical History: Past Medical History:  Diagnosis Date  . Atrial fibrillation ([place])   . CHF (congestive heart failure) ([place])   . Coronary artery disease   . Hypertension   . Kidney transplanted     Medications:  Prescriptions Prior to Admission  Medication Sig Dispense Refill Last Dose  . atorvastatin (LIPITOR) 80 MG tablet Take 80 mg by mouth daily.   11/14/2016 at Unknown time  . calcitRIOL (ROCALTROL) 0.5 MCG capsule Take 0.5 mcg by mouth daily.   11/14/2016 at Unknown time  . calcium carbonate 1250 MG capsule Take 1,800 mg by mouth 2 (two) times daily with a meal.   11/14/2016 at Unknown time  . furosemide (LASIX) 40 MG tablet Take 120 mg by mouth daily as needed for fluid.    unknown  . magnesium oxide (MAG-OX) 400 MG tablet Take 800 mg by mouth 2 (two) times daily.    11/14/2016 at Unknown time  . metoprolol succinate (TOPROL-XL) 25 MG 24 hr tablet Take 75 mg by mouth 2 (two) times daily.    11/14/2016 at 2100  .  Multiple Vitamins-Minerals (MULTIVITAMIN WITH MINERALS) tablet Take 1 tablet by mouth daily.   11/14/2016 at Unknown time  . omeprazole (PRILOSEC) 40 MG capsule Take 40 mg by mouth daily.   11/14/2016 at Unknown time  . predniSONE (DELTASONE) 10 MG tablet Take 10 mg by mouth daily with breakfast.   11/14/2016 at Unknown time  . tacrolimus (PROGRAF) 1 MG capsule Take 1 mg by mouth 2 (two) times daily.   11/14/2016 at Unknown time  . tamsulosin (FLOMAX) 0.4 MG CAPS capsule Take 0.4 mg by mouth daily.    11/14/2016 at Unknown time  . warfarin (COUMADIN) 6 MG tablet Take 6 mg by mouth daily. 6 mg Sat/Sun,  7 mg Mon-Friday   11/14/2016 at 2100    Assessment: 51 y.o. male admitted on 11/15/2016 with deep tissue abscess and fever. Patient has history of afib, HTN, EF CAD, and renal transplant. Pt noted left leg swollen for 4 days.  Asked to initiate Heparin for VTE treatment, no bolus per  MD.  Pt did receive Vitamin K 2mg  IV in ED.  INR therapeutic on admission. Heparin level is therapeutic.  No bleeding reported.  Goal of Therapy:  Heparin level 0.3-0.7 units/ml Monitor platelets by anticoagulation protocol: Yes   Plan:  Continue Heparin at  2000 units/hr Check anti-Xa level daily while on heparin Continue to monitor H&H and platelets  Isac Sarna, BS [place], BCPS Clinical Pharmacist Pager [phone] 11/19/2016,8:23 AM

## 2016-11-19 NOTE — Progress Notes (Signed)
1302 Paged Dr.Befekadu (nephro on call MD) regarding patient's new nephrology consult. Awaiting call back at this time.

## 2016-11-20 ENCOUNTER — Inpatient Hospital Stay (HOSPITAL_COMMUNITY): Payer: Medicare Other

## 2016-11-20 LAB — CREATININE, URINE, RANDOM: Creatinine, Urine: 198.18 mg/dL

## 2016-11-20 LAB — VITAMIN B12: Vitamin B-12: 1324 pg/mL — ABNORMAL HIGH (ref 180–914)

## 2016-11-20 LAB — BASIC METABOLIC PANEL
Anion gap: 11 (ref 5–15)
BUN: 40 mg/dL — ABNORMAL HIGH (ref 6–20)
CHLORIDE: 102 mmol/L (ref 101–111)
CO2: 21 mmol/L — ABNORMAL LOW (ref 22–32)
Calcium: 7.8 mg/dL — ABNORMAL LOW (ref 8.9–10.3)
Creatinine, Ser: 3.32 mg/dL — ABNORMAL HIGH (ref 0.61–1.24)
GFR calc non Af Amer: 20 mL/min — ABNORMAL LOW (ref >60)
GFR, EST AFRICAN AMERICAN: 24 mL/min — AB (ref >60)
Glucose, Bld: 102 mg/dL — ABNORMAL HIGH (ref 65–99)
POTASSIUM: 3.9 mmol/L (ref 3.5–5.1)
SODIUM: 134 mmol/L — AB (ref 135–145)

## 2016-11-20 LAB — PHOSPHORUS: Phosphorus: 3.4 mg/dL (ref 2.5–4.6)

## 2016-11-20 LAB — IRON AND TIBC
IRON: 15 ug/dL — AB (ref 45–182)
Saturation Ratios: 5 % — ABNORMAL LOW (ref 17.9–39.5)
TIBC: 279 ug/dL (ref 250–450)
UIBC: 264 ug/dL

## 2016-11-20 LAB — HEPARIN LEVEL (UNFRACTIONATED): Heparin Unfractionated: 0.39 IU/mL (ref 0.30–0.70)

## 2016-11-20 LAB — PROTIME-INR
INR: 1.54
Prothrombin Time: 18.6 seconds — ABNORMAL HIGH (ref 11.4–15.2)

## 2016-11-20 LAB — FERRITIN: Ferritin: 255 ng/mL (ref 24–336)

## 2016-11-20 LAB — SODIUM, URINE, RANDOM: Sodium, Ur: 47 mmol/L

## 2016-11-20 MED ORDER — SODIUM CHLORIDE 0.9 % IV SOLN: INTRAVENOUS | Status: DC

## 2016-11-20 NOTE — Consult Note (Signed)
Reason for Consult: Possible compartment syndrome left calf Referring Physician: DR Magda Bernheim is an 51 y.o. male.  HPI: 51 year old male presented with swollen tender calf had an ultrasound which showed a questionable DVT versus Baker's cyst rupture. MRI showed hematoma. Patient also has atrial fibrillation therefore he was on anticoagulation.  There was concern today that his calf is getting bigger that his pain was increasing and concern was noted that he was unable to weight-bear.  I initially saw him on January 2 signed off on the same date. I've been reconstituted by above-mentioned position for reevaluation for rule out compartment syndrome  The patient says that his calf is still tender he's not having any numbness or tingling he does have trouble putting his foot flat to the floor and weightbearing on his left leg. He sees a calf is the same in terms of size.  He does confirm inability to apply full weight  Past Medical History:  Diagnosis Date  . Atrial fibrillation ([place])   . CHF (congestive heart failure) ([place])   . Coronary artery disease   . Hypertension   . Kidney transplanted     Past Surgical History:  Procedure Laterality Date  . ABLATION    . CHOLECYSTECTOMY    . TRANSPLANTATION RENAL  2005    Family History  Problem Relation Age of Onset  . Hypertension Mother   . Heart attack Father     Social History:  reports that he has never smoked. He has never used smokeless tobacco. He reports that he does not drink alcohol. His drug history is not on file.  Allergies:  Allergies  Allergen Reactions  . Vancomycin Swelling    Medications: I have reviewed the patient's current medications.  Results for orders placed or performed during the hospital encounter of 11/15/16 (from the past 48 hour(s))  Protime-INR     Status: Abnormal   Collection Time: 11/19/16  5:56 AM  Result Value Ref Range   Prothrombin Time 19.9 (H) 11.4 - 15.2 seconds   INR  1.67   Heparin level (unfractionated)     Status: None   Collection Time: 11/19/16  5:56 AM  Result Value Ref Range   Heparin Unfractionated 0.56 0.30 - 0.70 IU/mL    Comment:        IF HEPARIN RESULTS ARE BELOW EXPECTED VALUES, AND PATIENT DOSAGE HAS BEEN CONFIRMED, SUGGEST FOLLOW UP TESTING OF ANTITHROMBIN III LEVELS.   Basic metabolic panel     Status: Abnormal   Collection Time: 11/19/16  5:56 AM  Result Value Ref Range   Sodium 133 (L) 135 - 145 mmol/L   Potassium 3.7 3.5 - 5.1 mmol/L   Chloride 101 101 - 111 mmol/L   CO2 22 22 - 32 mmol/L   Glucose, Bld 101 (H) 65 - 99 mg/dL   BUN 34 (H) 6 - 20 mg/dL   Creatinine, Ser 3.07 (H) 0.61 - 1.24 mg/dL   Calcium 7.9 (L) 8.9 - 10.3 mg/dL   GFR calc non Af Amer 22 (L) >60 mL/min   GFR calc Af Amer 26 (L) >60 mL/min    Comment: (NOTE) The eGFR has been calculated using the CKD EPI equation. This calculation has not been validated in all clinical situations. eGFR's persistently <60 mL/min signify possible Chronic Kidney Disease.    Anion gap 10 5 - 15  CBC     Status: Abnormal   Collection Time: 11/19/16  5:56 AM  Result Value Ref Range  WBC 14.3 (H) 4.0 - 10.5 K/uL   RBC 3.26 (L) 4.22 - 5.81 MIL/uL   Hemoglobin 8.8 (L) 13.0 - 17.0 g/dL   HCT 29.2 (L) 39.0 - 52.0 %   MCV 89.6 78.0 - 100.0 fL   MCH 27.0 26.0 - 34.0 pg   MCHC 30.1 30.0 - 36.0 g/dL   RDW 16.5 (H) 11.5 - 15.5 %   Platelets 247 150 - 400 K/uL  Vitamin B12     Status: Abnormal   Collection Time: 11/19/16 12:46 PM  Result Value Ref Range   Vitamin B-12 1,324 (H) 180 - 914 pg/mL    Comment: (NOTE) This assay is not validated for testing neonatal or myeloproliferative syndrome specimens for Vitamin B12 levels. Performed at [facility]   Folate     Status: None   Collection Time: 11/19/16 12:46 PM  Result Value Ref Range   Folate 14.8 >5.9 ng/mL    Comment: Performed at [facility]  Iron and TIBC     Status: Abnormal   Collection Time:  11/19/16 12:46 PM  Result Value Ref Range   Iron 15 (L) 45 - 182 ug/dL   TIBC 279 250 - 450 ug/dL   Saturation Ratios 5 (L) 17.9 - 39.5 %   UIBC 264 ug/dL    Comment: Performed at [facility]  Ferritin     Status: None   Collection Time: 11/19/16 12:46 PM  Result Value Ref Range   Ferritin 255 24 - 336 ng/mL    Comment: Performed at [facility]  Reticulocytes     Status: Abnormal   Collection Time: 11/19/16 12:46 PM  Result Value Ref Range   Retic Ct Pct 1.6 0.4 - 3.1 %   RBC. 3.43 (L) 4.22 - 5.81 MIL/uL   Retic Count, Manual 54.9 19.0 - 186.0 K/uL  Creatinine, urine, random     Status: None   Collection Time: 11/19/16  7:28 PM  Result Value Ref Range   Creatinine, Urine 164.26 mg/dL  Protein, urine, random     Status: None   Collection Time: 11/19/16  7:28 PM  Result Value Ref Range   Total Protein, Urine 39 mg/dL    Comment: NO NORMAL RANGE ESTABLISHED FOR THIS TEST  Protime-INR     Status: Abnormal   Collection Time: 11/20/16  4:33 AM  Result Value Ref Range   Prothrombin Time 18.6 (H) 11.4 - 15.2 seconds   INR 1.54   Heparin level (unfractionated)     Status: None   Collection Time: 11/20/16  4:33 AM  Result Value Ref Range   Heparin Unfractionated 0.39 0.30 - 0.70 IU/mL    Comment:        IF HEPARIN RESULTS ARE BELOW EXPECTED VALUES, AND PATIENT DOSAGE HAS BEEN CONFIRMED, SUGGEST FOLLOW UP TESTING OF ANTITHROMBIN III LEVELS.   Basic metabolic panel     Status: Abnormal   Collection Time: 11/20/16  4:33 AM  Result Value Ref Range   Sodium 134 (L) 135 - 145 mmol/L   Potassium 3.9 3.5 - 5.1 mmol/L   Chloride 102 101 - 111 mmol/L   CO2 21 (L) 22 - 32 mmol/L   Glucose, Bld 102 (H) 65 - 99 mg/dL   BUN 40 (H) 6 - 20 mg/dL   Creatinine, Ser 3.32 (H) 0.61 - 1.24 mg/dL   Calcium 7.8 (L) 8.9 - 10.3 mg/dL   GFR calc non Af Amer 20 (L) >60 mL/min   GFR calc Af  Amer 24 (L) >60 mL/min    Comment: (NOTE) The eGFR has been calculated using the CKD EPI  equation. This calculation has not been validated in all clinical situations. eGFR's persistently <60 mL/min signify possible Chronic Kidney Disease.    Anion gap 11 5 - 15  Phosphorus     Status: None   Collection Time: 11/20/16  4:33 AM  Result Value Ref Range   Phosphorus 3.4 2.5 - 4.6 mg/dL    US Renal Transplant W/doppler  Result Date: 11/20/2016 CLINICAL DATA:  51 year old male with acute tubular necrosis of a transplanted kidney EXAM: ULTRASOUND OF RENAL TRANSPLANT WITH RENAL DOPPLER ULTRASOUND TECHNIQUE: Ultrasound examination of the renal transplant was performed with gray-scale, color and duplex doppler evaluation. COMPARISON:  None. FINDINGS: Transplant kidney location: Right lower quadrant Transplant Kidney: Length: 11.0 cm. Normal in size and parenchymal echogenicity. No evidence of mass or hydronephrosis. No peri-transplant fluid collection seen. Color flow in the main renal artery:  Yes Color flow in the main renal vein:  Yes Duplex Doppler Evaluation: Main Renal Artery Resistive Index: 0.85 Venous waveform in main renal vein:  Present Intrarenal resistive index in upper pole:  0.77 (normal 0.6-0.8; equivocal 0.8-0.9; abnormal >= 0.9) Intrarenal resistive index in lower pole: 0.74 (normal 0.6-0.8; equivocal 0.8-0.9; abnormal >= 0.9) Bladder: Normal for degree of bladder distention. Other findings:  None. IMPRESSION: 1. 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 2. The internal resistive indices are within normal limits. 3. The resistive index in the main renal artery does not suggest the presence of renal artery stenosis. 4. The native right kidney is atrophic. Electronically Signed   By: Jacqulynn Cadet M.D.   On: 11/20/2016 11:24    Review of Systems  Constitutional: Negative for chills and fever.  Respiratory: Negative for shortness of breath.   Cardiovascular: Negative for chest pain.  Musculoskeletal: Positive for  myalgias.  Neurological: Negative for tingling and focal weakness.   Blood pressure (!) 88/50, pulse (!) 115, temperature 99 F (37.2 C), temperature source Oral, resp. rate (!) 33, height _0  (1.88 m), weight 283 lb 11.7 oz (128.7 kg), SpO2 93 %. Physical Exam  Constitutional: He appears well-developed and well-nourished. No distress.  HENT:  Head: Normocephalic and atraumatic.  Mouth/Throat: No oropharyngeal exudate.  Eyes: Pupils are equal, round, and reactive to light. Right eye exhibits no discharge. Left eye exhibits no discharge. No scleral icterus.  Cardiovascular: Intact distal pulses.  An irregular rhythm present.  Normal dorsalis pedis and posterior tibial artery pulses. Normal capillary refill.  Musculoskeletal:  Left calf Swollen left calf compared to right without any rigidity to palpation. Pain on passive stretch posterior compartments normal anterior compartment and lateral compartment  Decreased range of motion at the ankle joint and ankle joint is stable  No weakness except for pain related plantar flexion weakness  Neurological: He is alert. He has normal reflexes.  No sensory deficits on the dorsum or plantar aspect of the foot  Skin: Skin is warm and dry. He is not diaphoretic. There is erythema.  Psychiatric: He has a normal mood and affect. His behavior is normal. Judgment and thought content normal.    Assessment/Plan: Hematoma left calf, on anticoagulation secondary to atrial fibrillation.  Recommendations are for #1 if possible to stop the anticoagulation #2 continue active range of motion of the left ankle #3 and a K pad left calf to soften up the hematoma.  The patient has no clinical  sign of compartment syndrome    Arther Abbott 11/20/2016, 5:20 PM

## 2016-11-20 NOTE — Progress Notes (Signed)
Subjective: Interval History: has no complaint of difficulty breathing. Patient states that he is feeling much better. He denies any nausea or vomiting..  Objective: Vital signs in last 24 hours: Temp:  [98.8 F (37.1 C)-100 F (37.8 C)] 99.1 F (37.3 C) (01/06 0735) Pulse Rate:  [116-126] 124 (01/06 0900) Resp:  [36-53] 40 (01/06 0600) BP: (61-191)/(19-143) 148/107 (01/06 0900) SpO2:  [84 %-98 %] 91 % (01/06 0900) Weight:  [128.7 kg (283 lb 11.7 oz)] 128.7 kg (283 lb 11.7 oz) (01/06 0400) Weight change: -0.5 kg (-1 lb 1.6 oz)  Intake/Output from previous day: 01/05 0701 - 01/06 0700 In: 891.5 [I.V.:591.5; IV Piggyback:300] Out: 850 [Urine:850] Intake/Output this shift: Total I/O In: 95 [P.O.:75; I.V.:20] Out: 200 [Urine:200]  General appearance: alert, cooperative and no distress Resp: clear to auscultation bilaterally Cardio: irregularly irregular rhythm Extremities: No edema   Lab Results:  Recent Labs  11/19/16 0556  WBC 14.3*  HGB 8.8*  HCT 29.2*  PLT 247   BMET:  Recent Labs  11/19/16 0556 11/20/16 0433  NA 133* 134*  K 3.7 3.9  CL 101 102  CO2 22 21*  GLUCOSE 101* 102*  BUN 34* 40*  CREATININE 3.07* 3.32*  CALCIUM 7.9* 7.8*   No results for input(s): PTH in the last 72 hours. Iron Studies:  Recent Labs  11/19/16 1246  IRON 15*  TIBC 279  FERRITIN 255    Studies/Results: No results found.  I have reviewed the patient's current medications.  Assessment/Plan: Problem #1 acute kidney injury superimposed on chronic. Presently is BUN and creatinine is increasing. This could be secondary to prerenal syndrome versus ATN. Presently patient is nonoliguric. 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 He shouldn't states that his creatinine since 2005 has been above 2.4. He claims his baseline to be between 2.4 and higher but less than 3. Patient is being followed by his transplant doctors every 4 months.  According to the patient he has been compliant with his medication and taking Prograf/CellCept and steroid. Presently patient doesn't have any uremic sign and symptoms. Problem #3 hematoma/cellulitis on his left leg. Presently he is on antibiotics. Patient is a febrile and his white blood cell count remains high. Problem #4 history of hypertension: His blood pressure is controlled Problem #5 history of atrial fibrillation: His heart rate is controlled Problem #6 history of coronary artery disease Problem #7 anemia: His hemoglobin is low. His iron saturation is low but ferritin is normal. Patient might have iron deficiency anemia. Problem #8 metabolic bone disease: His calcium and phosphorus is range. Plan:. 1] We'll check urine sodium and creatinine 2] will start patient on IV fluid and hydrate him. 3] will check his renal panel. If his renal function continued to deteriorate patient may benefit from being transferred to his transplant nephrology group for possible kidney biopsy to rule out acute rejection. 4] will check CBC in the morning   LOS: 5 days   Jewel Venditto S 11/20/2016,9:57 AM

## 2016-11-20 NOTE — Progress Notes (Signed)
Home CPAP set up in room

## 2016-11-20 NOTE — Progress Notes (Signed)
PT TRANSFERRING TO ROOM 308 ON TELEMETRY. O2 AT 4L/MIN VIA [place]. IV SITES X2 PATENT. LT CALF REMAINS EDEMOTUS AND PURPLE. CIRCUMFERENCE 47.5 CM.HEPARIN DRIP CONTINUES AT 20CC/HR REPORT GIVEN TO Lilia Pro DISHMON RN ON 300.

## 2016-11-20 NOTE — Progress Notes (Signed)
Physical Therapy Treatment Patient Details Name: [name] MRN: [id number] DOB: 11-04-1966 Today's Date: 11/20/2016    History of Present Illness  [name]  is a 51 y.o. male, w Atrial fibrillation , Hypertension, CHF ? EF, CAD, renal transplant who presented with c/o fever x1 day,  Pt notes that his left leg been swollen x4 days.  Pt had evaluation in ED, at [facility].  Per pt no blood clot.  Pt sent home.     PT Comments    Therapist spoke to MD.  Re concern that pt may have compartmental syndrome.  Pt has the physical ability to ambulate needing no assistance besides supervision from therapist his limitation is his pain which is not improving.   Follow Up Recommendations    none   Equipment Recommendations  Rolling walker with 5" wheels    Recommendations for Other Services  none     Precautions / Restrictions Precautions Precautions: None Restrictions Weight Bearing Restrictions: No LLE Weight Bearing: Weight bearing as tolerated    Mobility  Bed Mobility Overal bed mobility: Independent             General bed mobility comments: slow independent supine to sit;  noted reports of sweating once sitting on EOB  Transfers Overall transfer level: Needs assistance Equipment used: Rolling walker (2 wheeled) Transfers: Sit to/from Stand Sit to Stand: Min assist;Supervision         General transfer comment: increased pain scale from 0-->5-7/10 with weight bearing, pt tolerated and continued with transfer with   Ambulation/Gait Ambulation/Gait assistance: Min assist;Supervision Ambulation Distance (Feet): 12 Feet Assistive device: Rolling walker (2 wheeled) Gait Pattern/deviations: Decreased weight shift to left         Stairs            Wheelchair Mobility    Modified Rankin (Stroke Patients Only)       Balance                                    Cognition Arousal/Alertness: Awake/alert Behavior During Therapy:  WFL for tasks assessed/performed Overall Cognitive Status: Within Functional Limits for tasks assessed                      Exercises General Exercises - Lower Extremity Ankle Circles/Pumps: Both;Seated;10 reps Gluteal Sets: Both;10 reps;Seated Long Arc Quad: Both;10 reps    General Comments        Pertinent Vitals/Pain Pain Score: 7  Pain Location: Lt leg pain increases with any activity .  After walking pain increased to a 9/10 Pain Descriptors / Indicators: Aching;Burning Pain Intervention(s): Limited activity within patient's tolerance    Home Living Family/patient expects to be discharged to:: Private residence Living Arrangements: Spouse/significant other Available Help at Discharge: Family Type of Home: House Home Access: Level entry   Home Layout: One level Home Equipment: None      Prior Function Level of Independence: Independent          PT Goals (current goals can now be found in the care plan section)      Frequency    Min 5X/week      PT Plan Frequency needs to be updated    Co-evaluation             End of Session Equipment Utilized During Treatment: Gait belt Activity Tolerance: Patient tolerated treatment well;Patient limited by pain Patient left:  in chair;with call bell/phone within reach;with family/visitor present     Time: 1213-1245 PT Time Calculation (min) (ACUTE ONLY): 32 min  Charges:  $Gait Training: 8-22 mins $Therapeutic Exercise: 8-22 mins                    G CodesRayetta [name], PT CLT [phone] 11/20/2016, 12:45 PM

## 2016-11-20 NOTE — Progress Notes (Signed)
ANTICOAGULATION CONSULT NOTE -  Pharmacy Consult for Heparin Indication: VTE treatment  Allergies  Allergen Reactions  . Vancomycin Swelling    Patient Measurements: Height: 6\' 2"  (188 cm) Weight: 283 lb 11.7 oz (128.7 kg) IBW/kg (Calculated) : 82.2 HEPARIN DW (KG): 109.3  Vital Signs: Temp: 99.1 F (37.3 C) (01/06 0735) Temp Source: Oral (01/06 0735) BP: 180/122 (01/06 0600) Pulse Rate: 125 (01/06 0735)  Labs:  Recent Labs  11/18/16 0442 11/19/16 0556 11/20/16 0433  HGB  --  8.8*  --   HCT  --  29.2*  --   PLT  --  247  --   LABPROT 19.0* 19.9*  --   INR 1.58 1.67  --   HEPARINUNFRC 0.45 0.56 0.39  CREATININE 2.86* 3.07* 3.32*    Estimated Creatinine Clearance: 38.8 mL/min (by C-G formula based on SCr of 3.32 mg/dL (H)).   Medical History: Past Medical History:  Diagnosis Date  . Atrial fibrillation ([place])   . CHF (congestive heart failure) ([place])   . Coronary artery disease   . Hypertension   . Kidney transplanted     Medications:  Prescriptions Prior to Admission  Medication Sig Dispense Refill Last Dose  . atorvastatin (LIPITOR) 80 MG tablet Take 80 mg by mouth daily.   11/14/2016 at Unknown time  . calcitRIOL (ROCALTROL) 0.5 MCG capsule Take 0.5 mcg by mouth daily.   11/14/2016 at Unknown time  . calcium carbonate 1250 MG capsule Take 1,800 mg by mouth 2 (two) times daily with a meal.   11/14/2016 at Unknown time  . furosemide (LASIX) 40 MG tablet Take 120 mg by mouth daily as needed for fluid.    unknown  . magnesium oxide (MAG-OX) 400 MG tablet Take 800 mg by mouth 2 (two) times daily.    11/14/2016 at Unknown time  . metoprolol succinate (TOPROL-XL) 25 MG 24 hr tablet Take 75 mg by mouth 2 (two) times daily.    11/14/2016 at 2100  . Multiple Vitamins-Minerals (MULTIVITAMIN WITH MINERALS) tablet Take 1 tablet by mouth daily.   11/14/2016 at Unknown time  . omeprazole (PRILOSEC) 40 MG capsule Take 40 mg by mouth daily.   11/14/2016 at Unknown time  .  predniSONE (DELTASONE) 10 MG tablet Take 10 mg by mouth daily with breakfast.   11/14/2016 at Unknown time  . tacrolimus (PROGRAF) 1 MG capsule Take 1 mg by mouth 2 (two) times daily.   11/14/2016 at Unknown time  . tamsulosin (FLOMAX) 0.4 MG CAPS capsule Take 0.4 mg by mouth daily.    11/14/2016 at Unknown time  . warfarin (COUMADIN) 6 MG tablet Take 6 mg by mouth daily. 6 mg Sat/Sun,  7 mg Mon-Friday   11/14/2016 at 2100    Assessment: 51 y.o. male admitted on 11/15/2016 with deep tissue abscess and fever. Patient has history of afib, HTN, EF CAD, and renal transplant. Pt noted left leg swollen for 4 days.  Asked to initiate Heparin for VTE treatment, no bolus per MD.  Pt did receive Vitamin K 2mg  IV in ED.  INR therapeutic on admission. Heparin level remans therapeutic.  Hg has drifted down but no overt bleeding noted  Goal of Therapy:  Heparin level 0.3-0.7 units/ml Monitor platelets by anticoagulation protocol: Yes   Plan:  Continue Heparin at  2000 units/hr Check anti-Xa level daily while on heparin Continue to monitor H&H and platelets  Thanks for allowing pharmacy to be a part of this patient's care.  Excell Seltzer, PharmD Clinical  Pharmacist  11/20/2016,8:50 AM
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
## 2016-11-21 LAB — RENAL FUNCTION PANEL
ANION GAP: 11 (ref 5–15)
Albumin: 2.6 g/dL — ABNORMAL LOW (ref 3.5–5.0)
BUN: 48 mg/dL — ABNORMAL HIGH (ref 6–20)
CHLORIDE: 101 mmol/L (ref 101–111)
CO2: 20 mmol/L — AB (ref 22–32)
Calcium: 7.9 mg/dL — ABNORMAL LOW (ref 8.9–10.3)
Creatinine, Ser: 3.58 mg/dL — ABNORMAL HIGH (ref 0.61–1.24)
GFR calc non Af Amer: 19 mL/min — ABNORMAL LOW (ref >60)
GFR, EST AFRICAN AMERICAN: 22 mL/min — AB (ref >60)
Glucose, Bld: 92 mg/dL (ref 65–99)
Phosphorus: 3.8 mg/dL (ref 2.5–4.6)
Potassium: 4.1 mmol/L (ref 3.5–5.1)
Sodium: 132 mmol/L — ABNORMAL LOW (ref 135–145)

## 2016-11-21 LAB — CULTURE, BLOOD (ROUTINE X 2)
Culture: NO GROWTH
Culture: NO GROWTH

## 2016-11-21 LAB — CBC
HCT: 28.1 % — ABNORMAL LOW (ref 39.0–52.0)
Hemoglobin: 8.8 g/dL — ABNORMAL LOW (ref 13.0–17.0)
MCH: 28 pg (ref 26.0–34.0)
MCHC: 31.3 g/dL (ref 30.0–36.0)
MCV: 89.5 fL (ref 78.0–100.0)
Platelets: 400 10*3/uL (ref 150–400)
RBC: 3.14 MIL/uL — ABNORMAL LOW (ref 4.22–5.81)
RDW: 17 % — ABNORMAL HIGH (ref 11.5–15.5)
WBC: 18.3 10*3/uL — AB (ref 4.0–10.5)

## 2016-11-21 LAB — PROTIME-INR
INR: 1.34
Prothrombin Time: 16.7 seconds — ABNORMAL HIGH (ref 11.4–15.2)

## 2016-11-21 MED ORDER — SODIUM CHLORIDE 0.9 % IV SOLN
INTRAVENOUS | Status: DC
  Administered 2016-11-21: 11:00:00 via INTRAVENOUS

## 2016-11-21 NOTE — Progress Notes (Signed)
Subjective: Interval History: Patient states that he is feeling much better. He denies any fever chills or sweating. No difficulty breathing.  Objective: Vital signs in last 24 hours: Temp:  [98.5 F (36.9 C)-99 F (37.2 C)] 98.9 F (37.2 C) (01/07 0634) Pulse Rate:  [113-124] 116 (01/07 0634) Resp:  [20-33] 20 (01/07 0634) BP: (88-166)/(45-97) 110/54 (01/07 0634) SpO2:  [88 %-100 %] 99 % (01/07 0634) Weight change:   Intake/Output from previous day: 01/06 0701 - 01/07 0700 In: 1410 [P.O.:1200; I.V.:160; IV Piggyback:50] Out: 9509 [Urine:1450] Intake/Output this shift: No intake/output data recorded.  General appearance: alert, cooperative and no distress Resp: clear to auscultation bilaterally Cardio: irregularly irregular rhythm Extremities: No edema   Lab Results:  Recent Labs  11/19/16 0556  WBC 14.3*  HGB 8.8*  HCT 29.2*  PLT 247   BMET:   Recent Labs  11/20/16 0433 11/21/16 0614  NA 134* 132*  K 3.9 4.1  CL 102 101  CO2 21* 20*  GLUCOSE 102* 92  BUN 40* 48*  CREATININE 3.32* 3.58*  CALCIUM 7.8* 7.9*   No results for input(s): PTH in the last 72 hours. 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 COMPARISON:  None. FINDINGS: Transplant kidney location: Right lower quadrant Transplant Kidney: Length: 11.0 cm. Normal in size and parenchymal echogenicity. No evidence of mass or hydronephrosis. No peri-transplant fluid collection seen. Color flow in the main renal artery:  Yes Color flow in the main renal vein:  Yes Duplex Doppler Evaluation: Main Renal Artery Resistive  Index: 0.85 Venous waveform in main renal vein:  Present Intrarenal resistive index in upper pole:  0.77 (normal 0.6-0.8; equivocal 0.8-0.9; abnormal >= 0.9) Intrarenal resistive index in lower pole: 0.74 (normal 0.6-0.8; equivocal 0.8-0.9; abnormal >= 0.9) Bladder: Normal for degree of bladder distention. Other findings:  None. IMPRESSION: 1. 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 2. The internal resistive indices are within normal limits. 3. The resistive index in the main renal artery does not suggest the presence of renal artery stenosis. 4. The native right kidney is atrophic. Electronically Signed   By: Jacqulynn Cadet M.D.   On: 11/20/2016 11:24    I have reviewed the patient's current medications.  Assessment/Plan: Problem #1 acute kidney injury superimposed on chronic. This could be secondary to prerenal syndrome versus ATN. Presently patient is nonoliguric. His renal function continued to decline. Presently he is asymptomatic. Tacrolimus level is pending. 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 He shouldn't states that his creatinine since 2005 has been above 2.4. He claims his baseline to be between 2.4 and higher but less than 3. Problem #3 hematoma/cellulitis on his left leg. Presently he is on antibiotics. Patient is a febrile and his white blood cell count remains high. Problem #4 history of hypertension: His blood pressure is controlled Problem #5 history of atrial fibrillation: His heart rate is controlled Problem #6 history of coronary artery disease Problem #7 anemia: His hemoglobin is low. His iron saturation is low but ferritin is normal. Patient might have iron deficiency anemia. Problem #8 metabolic bone disease:  His calcium and phosphorus is range. Patient is not on any binder. Plan:. 1] We'll start patient on normal saline at 125 mL per hour 2] we'll DC Lasix 3]  we'll check his renal panel.  4] will check CBC in the morning   LOS: 6 days   Timmia Cogburn S 11/21/2016,9:05 AM
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
## 2016-11-22 ENCOUNTER — Inpatient Hospital Stay (HOSPITAL_COMMUNITY): Payer: Medicare Other

## 2016-11-22 LAB — CBC
HEMATOCRIT: 27 % — AB (ref 39.0–52.0)
Hemoglobin: 8.2 g/dL — ABNORMAL LOW (ref 13.0–17.0)
MCH: 27.2 pg (ref 26.0–34.0)
MCHC: 30.4 g/dL (ref 30.0–36.0)
MCV: 89.4 fL (ref 78.0–100.0)
PLATELETS: 408 10*3/uL — AB (ref 150–400)
RBC: 3.02 MIL/uL — ABNORMAL LOW (ref 4.22–5.81)
RDW: 17.2 % — AB (ref 11.5–15.5)
WBC: 14.6 10*3/uL — AB (ref 4.0–10.5)

## 2016-11-22 LAB — RENAL FUNCTION PANEL
Albumin: 2.7 g/dL — ABNORMAL LOW (ref 3.5–5.0)
Anion gap: 9 (ref 5–15)
BUN: 49 mg/dL — AB (ref 6–20)
CHLORIDE: 104 mmol/L (ref 101–111)
CO2: 21 mmol/L — AB (ref 22–32)
CREATININE: 3.26 mg/dL — AB (ref 0.61–1.24)
Calcium: 7.8 mg/dL — ABNORMAL LOW (ref 8.9–10.3)
GFR calc Af Amer: 24 mL/min — ABNORMAL LOW (ref >60)
GFR calc non Af Amer: 21 mL/min — ABNORMAL LOW (ref >60)
GLUCOSE: 103 mg/dL — AB (ref 65–99)
POTASSIUM: 4 mmol/L (ref 3.5–5.1)
Phosphorus: 2.8 mg/dL (ref 2.5–4.6)
Sodium: 134 mmol/L — ABNORMAL LOW (ref 135–145)

## 2016-11-22 LAB — PROTIME-INR
INR: 1.34
PROTHROMBIN TIME: 16.7 s — AB (ref 11.4–15.2)

## 2016-11-22 LAB — TACROLIMUS LEVEL: Tacrolimus (FK506) - LabCorp: 5.2 ng/mL (ref 2.0–20.0)

## 2016-11-22 MED ORDER — POLYSACCHARIDE IRON COMPLEX 150 MG PO CAPS
150.0000 mg | ORAL_CAPSULE | Freq: Two times a day (BID) | ORAL | Status: DC
  Administered 2016-11-22 – 2016-11-25 (×7): 150 mg via ORAL
  Filled 2016-11-22 (×7): qty 1

## 2016-11-22 MED ORDER — CEFAZOLIN IN D5W 1 GM/50ML IV SOLN
1.0000 g | Freq: Three times a day (TID) | INTRAVENOUS | Status: DC
  Administered 2016-11-22 – 2016-11-25 (×10): 1 g via INTRAVENOUS
  Filled 2016-11-22 (×13): qty 50

## 2016-11-22 MED ORDER — SODIUM CHLORIDE 0.9 % IV SOLN
INTRAVENOUS | Status: DC
  Administered 2016-11-22 – 2016-11-23 (×2): via INTRAVENOUS
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
## 2016-11-22 NOTE — Progress Notes (Signed)
Subjective: Interval History: Patient presently offers no complaints. He's feeling much better. Denies any difficulty breathing. And his appetite is good  Objective: Vital signs in last 24 hours: Temp:  [98.2 F (36.8 C)-98.7 F (37.1 C)] 98.7 F (37.1 C) (01/08 0504) Pulse Rate:  [58-120] 120 (01/08 0504) Resp:  [20] 20 (01/08 0504) BP: (109-140)/(44-76) 119/49 (01/08 0504) SpO2:  [94 %-98 %] 96 % (01/08 0504) Weight:  [130 kg (286 lb 11.2 oz)] 130 kg (286 lb 11.2 oz) (01/08 0504) Weight change:   Intake/Output from previous day: 01/07 0701 - 01/08 0700 In: 1130.4 [P.O.:720; I.V.:410.4] Out: 1000 [Urine:1000] Intake/Output this shift: No intake/output data recorded.  General appearance: alert, cooperative and no distress Resp: clear to auscultation bilaterally Cardio: irregularly irregular rhythm Extremities: No edema   Lab Results:  Recent Labs  11/21/16 1817  WBC 18.3*  HGB 8.8*  HCT 28.1*  PLT 400   BMET:   Recent Labs  11/21/16 0614 11/22/16 0820  NA 132* 134*  K 4.1 4.0  CL 101 104  CO2 20* 21*  GLUCOSE 92 103*  BUN 48* 49*  CREATININE 3.58* 3.26*  CALCIUM 7.9* 7.8*   No results for input(s): PTH in the last 72 hours. 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 COMPARISON:  None. FINDINGS: Transplant kidney location: Right lower quadrant Transplant Kidney: Length: 11.0 cm. Normal in size and parenchymal echogenicity. No evidence of mass or hydronephrosis. No peri-transplant fluid collection seen. Color flow in the main renal artery:  Yes Color flow in the main renal  vein:  Yes Duplex Doppler Evaluation: Main Renal Artery Resistive Index: 0.85 Venous waveform in main renal vein:  Present Intrarenal resistive index in upper pole:  0.77 (normal 0.6-0.8; equivocal 0.8-0.9; abnormal >= 0.9) Intrarenal resistive index in lower pole: 0.74 (normal 0.6-0.8; equivocal 0.8-0.9; abnormal >= 0.9) Bladder: Normal for degree of bladder distention. Other findings:  None. IMPRESSION: 1. 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 2. The internal resistive indices are within normal limits. 3. The resistive index in the main renal artery does not suggest the presence of renal artery stenosis. 4. The native right kidney is atrophic. Electronically Signed   By: Jacqulynn Cadet M.D.   On: 11/20/2016 11:24    I have reviewed the patient's current medications.  Assessment/Plan: Problem #1 acute kidney injury superimposed on chronic. This could be secondary to prerenal syndrome versus ATN. Patient is non-oliguric. His renal function presently start improving. His potassium remains normal. 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 Problem #3 hematoma/cellulitis on his left leg. Presently he is on antibiotics. Patient is a febrile and his white blood cell count remains high. Problem #4 history of hypertension: His blood pressure is reasonably controlled. Problem #5 history of atrial fibrillation: His heart rate is controlled Problem #6 history of coronary artery disease Problem #7 anemia: His hemoglobin is low. His iron saturation is low but ferritin is normal. Patient might have iron deficiency anemia. Problem #8 metabolic bone disease: His calcium and phosphorus is range. Patient is not  on any binder. Plan:. 1] We'll continue his hydration. 2] we will start patient on Nu-Iron 150 mg by mouth once a day. 3] we'll check his renal panel.  4] will check  CBC in the morning   LOS: 7 days   Milly Goggins S 11/22/2016,9:07 AM

## 2016-11-22 NOTE — Progress Notes (Signed)
Physical Therapy Note  Patient Details  Name: [name] MRN: [id number] Date of Birth: 04-17-1966 Today's Date: 11/22/2016    Pt declined therapy today as just returning from [place] and is tired.  Expressed interest in participating in therapy tomorrow.   Teena Irani, PTA/CLT [phone]  11/22/2016, 4:57 PM

## 2016-11-23 LAB — RENAL FUNCTION PANEL
ALBUMIN: 2.8 g/dL — AB (ref 3.5–5.0)
ANION GAP: 8 (ref 5–15)
BUN: 45 mg/dL — ABNORMAL HIGH (ref 6–20)
CALCIUM: 7.9 mg/dL — AB (ref 8.9–10.3)
CO2: 21 mmol/L — AB (ref 22–32)
Chloride: 105 mmol/L (ref 101–111)
Creatinine, Ser: 2.8 mg/dL — ABNORMAL HIGH (ref 0.61–1.24)
GFR calc non Af Amer: 25 mL/min — ABNORMAL LOW (ref >60)
GFR, EST AFRICAN AMERICAN: 29 mL/min — AB (ref >60)
Glucose, Bld: 97 mg/dL (ref 65–99)
PHOSPHORUS: 2.7 mg/dL (ref 2.5–4.6)
Potassium: 4.2 mmol/L (ref 3.5–5.1)
SODIUM: 134 mmol/L — AB (ref 135–145)

## 2016-11-23 LAB — CBC
HCT: 28.7 % — ABNORMAL LOW (ref 39.0–52.0)
HEMOGLOBIN: 8.8 g/dL — AB (ref 13.0–17.0)
MCH: 27.2 pg (ref 26.0–34.0)
MCHC: 30.7 g/dL (ref 30.0–36.0)
MCV: 88.6 fL (ref 78.0–100.0)
Platelets: 461 10*3/uL — ABNORMAL HIGH (ref 150–400)
RBC: 3.24 MIL/uL — AB (ref 4.22–5.81)
RDW: 17.4 % — ABNORMAL HIGH (ref 11.5–15.5)
WBC: 15 10*3/uL — ABNORMAL HIGH (ref 4.0–10.5)

## 2016-11-23 LAB — PROTIME-INR
INR: 1.26
Prothrombin Time: 15.9 seconds — ABNORMAL HIGH (ref 11.4–15.2)

## 2016-11-23 NOTE — Progress Notes (Signed)
Subjective: Interval History: Patient presently denies any difficulty breathing. His feeling much better.  Objective: Vital signs in last 24 hours: Temp:  [98.5 F (36.9 C)-98.8 F (37.1 C)] 98.7 F (37.1 C) (01/09 0614) Pulse Rate:  [83-114] 114 (01/09 0614) Resp:  [18-20] 20 (01/09 0614) BP: (93-128)/(43-46) 128/43 (01/09 0614) SpO2:  [94 %-100 %] 100 % (01/09 0614) Weight:  [130.3 kg (287 lb 4.8 oz)] 130.3 kg (287 lb 4.8 oz) (01/09 0614) Weight change: 0.272 kg (9.6 oz)  Intake/Output from previous day: 01/08 0701 - 01/09 0700 In: 3580 [P.O.:480; I.V.:2950; IV Piggyback:150] Out: 1250 [Urine:1250] Intake/Output this shift: Total I/O In: 240 [P.O.:240] Out: -   General appearance: alert, cooperative and no distress Resp: clear to auscultation bilaterally Cardio: irregularly irregular rhythm Extremities: No edema . He has still some swelling on the left leg.  Lab Results:  Recent Labs  11/22/16 0820 11/23/16 0547  WBC 14.6* 15.0*  HGB 8.2* 8.8*  HCT 27.0* 28.7*  PLT 408* 461*   BMET:   Recent Labs  11/22/16 0820 11/23/16 0550  NA 134* 134*  K 4.0 4.2  CL 104 105  CO2 21* 21*  GLUCOSE 103* 97  BUN 49* 45*  CREATININE 3.26* 2.80*  CALCIUM 7.8* 7.9*   No results for input(s): PTH in the last 72 hours. Iron Studies:  No results for input(s): IRON, TIBC, TRANSFERRIN, FERRITIN in the last 72 hours.  Studies/Results: US Venous Img Lower Unilateral Left  Result Date: 11/22/2016 CLINICAL DATA:  Subacute medial left gastrocnemius hematoma. Popliteal DVT, left. EXAM: LEFT LOWER EXTREMITY VENOUS DOPPLER ULTRASOUND TECHNIQUE: Gray-scale sonography with compression, as well as color and duplex ultrasound, were performed to evaluate the deep venous system from the level of the common femoral vein through the popliteal and proximal calf veins. COMPARISON:  MR 11/16/2016, ultrasound 11/15/2016 FINDINGS: Normal compressibility of the common femoral, superficial femoral,  and popliteal veins, as well as the proximal calf veins. No filling defects to suggest DVT on grayscale or color Doppler imaging. Doppler waveforms show normal direction of venous flow, normal respiratory phasicity and response to augmentation. There is subcutaneous edema in the thigh. Complex 10.3 x 4.7 cm collection in the left calf. Survey views of the contralateral common femoral vein are unremarkable. IMPRESSION: No evidence of lower extremity deep vein thrombosis. The popliteal thrombus suspected previously is no longer conspicuous. Electronically Signed   By: Lucrezia Europe M.D.   On: 11/22/2016 15:45    I have reviewed the patient's current medications.  Assessment/Plan: Problem #1 acute kidney injury superimposed on chronic. This could be secondary to prerenal syndrome versus ATN. Patient is non-oliguric. His renal function continued to improve. His creatinine however is still above his baseline. 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 Problem #3 hematoma/cellulitis on his left leg. Presently he is on antibiotics. Patient claims he is feeling better. His white blood cell count however remains high. Problem #4 history of hypertension: His blood pressure is reasonably controlled. Problem #5 history of atrial fibrillation: His heart rate is controlled Problem #6 history of coronary artery disease Problem #7 anemia: His hemoglobin is low. His iron saturation is low but ferritin is normal. Patient is on oral iron. Problem #8 metabolic bone disease: His calcium and phosphorus is range. Patient is not on any binder. Plan:. 1] We'll DC IV fluid 2] we'll check his renal panel.    LOS: 8 days   [facility] S  11/23/2016,10:02 AM

## 2016-11-23 NOTE — Care Management Note (Signed)
Case Management Note  Patient Details  Name: Abhinav Mayorquin MRN: [id number] Date of Birth: 1966/05/29   If discussed at Long Length of Stay Meetings, dates discussed:  11/23/2016  Additional Comments:  Keilynn Marano, Chauncey Reading, RN 11/23/2016, 10:46 AM
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
## 2016-11-23 NOTE — Progress Notes (Signed)
Physical Therapy Treatment Patient Details Name: [name] MRN: [id number] DOB: 1966-09-20 Today's Date: 11/23/2016    History of Present Illness  Gerik Coberly  is a 51 y.o. male, w Atrial fibrillation , Hypertension, CHF ? EF, CAD, renal transplant who presented with c/o fever x1 day,  Pt notes that his left leg been swollen x4 days.  Pt had evaluation in ED, at [facility].  Per pt no blood clot.  Pt sent home.     PT Comments    Pt just finishing bath and eager to work with therapy.  Able to demonstrate several exercises patient has been completing independently to improve strength and range of motion of Lt LE.  Pt able to complete all transfers slowly but independently.  Pt without pain reported in Lt LE with weight bearing today, only slight discomfort from swelling.  Pt ambulated 2 bouts of 25 feet with rest between due to extreme fatigue.  Pt returned to chair with call bell in reach.  No pain or discomfort reported at end of session.             Precautions / Restrictions Precautions Precautions: None Restrictions Weight Bearing Restrictions: No LLE Weight Bearing: Weight bearing as tolerated    Mobility  Bed Mobility Overal bed mobility: Independent                Transfers Overall transfer level: Modified independent   Transfers: Sit to/from Stand Sit to Stand: Modified independent (Device/Increase time)            Ambulation/Gait Ambulation/Gait assistance: Min guard Ambulation Distance (Feet): 50 Feet (25 feet X 2 bouts with rest between) Assistive device: Rolling walker (2 wheeled) Gait Pattern/deviations: Step-to pattern;Decreased stance time - left               Cognition Arousal/Alertness: Awake/alert Behavior During Therapy: WFL for tasks assessed/performed Overall Cognitive Status: Within Functional Limits for tasks assessed                      Exercises General Exercises - Lower Extremity Ankle Circles/Pumps:  AROM;Both;10 reps Quad Sets: AROM;Left Heel Slides: AROM;Left;10 reps        Pertinent Vitals/Pain Pain Assessment: No/denies pain                   PT Goals (current goals can now be found in the care plan section) Progress towards PT goals: Progressing toward goals    Frequency           PT Plan Current plan remains appropriate       End of Session Equipment Utilized During Treatment: Gait belt Activity Tolerance: Patient tolerated treatment well;Patient limited by fatigue Patient left: in chair     Time: 1330-1353 PT Time Calculation (min) (ACUTE ONLY): 23 min  Charges:  $Gait Training: 8-22 mins $Therapeutic Exercise: 8-22 mins                     Teena Irani, PTA/CLT [phone]  11/23/2016, 3:43 PM

## 2016-11-24 LAB — RENAL FUNCTION PANEL
ALBUMIN: 2.6 g/dL — AB (ref 3.5–5.0)
Anion gap: 8 (ref 5–15)
BUN: 44 mg/dL — AB (ref 6–20)
CO2: 20 mmol/L — ABNORMAL LOW (ref 22–32)
Calcium: 8.1 mg/dL — ABNORMAL LOW (ref 8.9–10.3)
Chloride: 105 mmol/L (ref 101–111)
Creatinine, Ser: 2.84 mg/dL — ABNORMAL HIGH (ref 0.61–1.24)
GFR calc Af Amer: 29 mL/min — ABNORMAL LOW (ref >60)
GFR, EST NON AFRICAN AMERICAN: 25 mL/min — AB (ref >60)
Glucose, Bld: 90 mg/dL (ref 65–99)
PHOSPHORUS: 2.3 mg/dL — AB (ref 2.5–4.6)
POTASSIUM: 4.4 mmol/L (ref 3.5–5.1)
Sodium: 133 mmol/L — ABNORMAL LOW (ref 135–145)

## 2016-11-24 LAB — CBC
HEMATOCRIT: 27.7 % — AB (ref 39.0–52.0)
Hemoglobin: 8.4 g/dL — ABNORMAL LOW (ref 13.0–17.0)
MCH: 27 pg (ref 26.0–34.0)
MCHC: 30.3 g/dL (ref 30.0–36.0)
MCV: 89.1 fL (ref 78.0–100.0)
PLATELETS: 480 10*3/uL — AB (ref 150–400)
RBC: 3.11 MIL/uL — ABNORMAL LOW (ref 4.22–5.81)
RDW: 17.9 % — ABNORMAL HIGH (ref 11.5–15.5)
WBC: 17.4 10*3/uL — AB (ref 4.0–10.5)

## 2016-11-24 LAB — PROTIME-INR
INR: 1.52
Prothrombin Time: 18.5 seconds — ABNORMAL HIGH (ref 11.4–15.2)

## 2016-11-24 LAB — C DIFFICILE QUICK SCREEN W PCR REFLEX
C DIFFICILE (CDIFF) TOXIN: NEGATIVE
C Diff antigen: NEGATIVE
C Diff interpretation: NOT DETECTED

## 2016-11-24 NOTE — Progress Notes (Signed)
Physical Therapy Treatment Patient Details Name: [name] MRN: [id number] DOB: [birth year] Today's Date: 11/24/2016    History of Present Illness  Amere Bricco  is a 51 y.o. male, w Atrial fibrillation , Hypertension, CHF ? EF, CAD, renal transplant who presented with c/o fever x1 day,  Pt notes that his left leg been swollen x4 days.  Pt had evaluation in ED, at [facility].  Per pt no blood clot.  Pt sent home.     PT Comments    Pt supine in bed and willing to participate with therapist this morning.  Pt reports he has had multiple diarrhea episodes during night, RN aware of this.  Pt requested to put on tennis shoes for increased ease with gait, foot too swollen to fit in shoe today.  Gait training complete 73ft with min guard, pt able to complete safely with RW, noted slow gait and cueing required for posture.  EOS pt left sitting on EOB per request, does plan to sit in recliner following breakfast.  No reports of increased pain Lt LE, RN aware of pain medication request.    Follow Up Recommendations  Home health PT     Equipment Recommendations  Rolling walker with 5" wheels    Recommendations for Other Services       Precautions / Restrictions Precautions Precautions: None Restrictions Weight Bearing Restrictions: No LLE Weight Bearing: Weight bearing as tolerated    Mobility  Bed Mobility Overal bed mobility: Independent             General bed mobility comments: Independent bed mobilty suping to sit  Transfers Overall transfer level: Modified independent Equipment used: Rolling walker (2 wheeled) Transfers: Sit to/from Stand           General transfer comment: increased time to complete STS from bed and BSC with cueing for handplacement for A  Ambulation/Gait Ambulation/Gait assistance: Min guard Ambulation Distance (Feet): 35 Feet Assistive device: Rolling walker (2 wheeled) Gait Pattern/deviations: Step-to pattern;Decreased stance time -  left   Gait velocity interpretation: Below normal speed for age/gender     Stairs            Wheelchair Mobility    Modified Rankin (Stroke Patients Only)       Balance                                    Cognition Arousal/Alertness: Awake/alert Behavior During Therapy: WFL for tasks assessed/performed Overall Cognitive Status: Within Functional Limits for tasks assessed                      Exercises General Exercises - Lower Extremity Ankle Circles/Pumps: AROM;Both;10 reps;Seated Other Exercises Other Exercises: Passive calf stretch 3x 30" in seated position    General Comments        Pertinent Vitals/Pain Pain Score: 5  Pain Location: Lt foot and posterior knee, noted edema present pain scale 4-5/10 Pain Descriptors / Indicators: Aching;Sore Pain Intervention(s): Limited activity within patient's tolerance;Monitored during session;Repositioned;Patient requesting pain meds-RN notified    Home Living                      Prior Function            PT Goals (current goals can now be found in the care plan section) Progress towards PT goals: Progressing toward goals    Frequency  Min 5X/week      PT Plan Current plan remains appropriate    Co-evaluation             End of Session Equipment Utilized During Treatment: Gait belt Activity Tolerance: Patient tolerated treatment well;Patient limited by fatigue Patient left: in bed;with nursing/sitter in room;with call bell/phone within reach (Pt requested to sit on EOB due to uncomfortable chair in room, RN aware)     Time: 0810-0850 PT Time Calculation (min) (ACUTE ONLY): 40 min  Charges:  $Gait Training: 8-22 mins $Therapeutic Activity: 8-22 mins                    G Codes:     Ihor Austin, LPTA; CBIS [phone]  Aldona Lento 11/24/2016, 9:13 AM

## 2016-11-24 NOTE — Progress Notes (Signed)
[name]  MRN: [id number]  DOB/AGE: 02-14-1966 51 y.o.  Primary Care Physician:No PCP Per Patient  Admit date: 11/15/2016  Chief Complaint:  Chief Complaint  Patient presents with  . Fever    S-Pt presented on  11/15/2016 with  Chief Complaint  Patient presents with  . Fever  .    Pt today feels better  Meds . amiodarone  400 mg Oral BID  . aspirin  81 mg Oral Daily  . atorvastatin  80 mg Oral q1800  . calcitRIOL  0.5 mcg Oral Daily  .  ceFAZolin (ANCEF) IV  1 g Intravenous Q8H  . iron polysaccharides  150 mg Oral BID  . magnesium oxide  200 mg Oral Daily  . metoprolol tartrate  100 mg Oral BID  . multivitamin with minerals  1 tablet Oral Daily  . mycophenolate  750 mg Oral BID  . pantoprazole  40 mg Oral Daily  . predniSONE  10 mg Oral Q breakfast  . tacrolimus  1 mg Oral BID  . tamsulosin  0.4 mg Oral Daily          Physical Exam: Vital signs in last 24 hours: Temp:  [99 F (37.2 C)-100 F (37.8 C)] 99 F (37.2 C) (01/10 0445) Pulse Rate:  [110-116] 110 (01/10 0445) Resp:  [22] 22 (01/10 0445) BP: (100-131)/(31-61) 100/61 (01/10 0445) SpO2:  [95 %-96 %] 96 % (01/10 0445) Weight:  [289 lb 6.4 oz (131.3 kg)] 289 lb 6.4 oz (131.3 kg) (01/10 0445) Weight change: 2 lb 1.6 oz (0.953 kg) Last BM Date: 11/23/16  Intake/Output from previous day: 01/09 0701 - 01/10 0700 In: 27 [P.O.:600; IV Piggyback:50] Out: 1300 [Urine:1300] Total I/O In: 240 [P.O.:240] Out: 250 [Urine:250]   Physical Exam: General- pt is awake,alert, oriented to time place and person Resp- No acute REsp distress, CTA B/L NO Rhonchi CVS- S1S2 regular ij rate and rhythm GIT- BS+, soft, NT, ND EXT- trace LE Edema on right side. 1+ Swelling present on left leg , No Cyanosis Access AVF present  Lab Results: CBC  Recent Labs  11/23/16 0547 11/24/16 0546  WBC 15.0* 17.4*  HGB 8.8* 8.4*  HCT 28.7* 27.7*  PLT 461* 480*    BMET  Recent Labs  11/23/16 0550 11/24/16 0546   NA 134* 133*  K 4.2 4.4  CL 105 105  CO2 21* 20*  GLUCOSE 97 90  BUN 45* 44*  CREATININE 2.80* 2.84*  CALCIUM 7.9* 8.1*    Creat trend 2018 3.5=> 2.8           2.3--3.2 ( Pt baseline)  MICRO Recent Results (from the past 240 hour(s))  Blood Culture (routine x 2)     Status: None   Collection Time: 11/15/16  2:05 AM  Result Value Ref Range Status   Specimen Description BLOOD RIGHT ANTECUBITAL  Final   Special Requests BOTTLES DRAWN AEROBIC AND ANAEROBIC 10CC EACH  Final   Culture NO GROWTH 6 DAYS  Final   Report Status 11/21/2016 FINAL  Final  Blood Culture (routine x 2)     Status: None   Collection Time: 11/15/16  2:18 AM  Result Value Ref Range Status   Specimen Description BLOOD RIGHT FOREARM  Final   Special Requests BOTTLES DRAWN AEROBIC AND ANAEROBIC [place]  Final   Culture NO GROWTH 6 DAYS  Final   Report Status 11/21/2016 FINAL  Final  Urine culture     Status: Abnormal   Collection Time: 11/15/16  3:30 AM  Result Value Ref Range Status   Specimen Description URINE, CLEAN CATCH  Final   Special Requests NONE  Final   Culture (A)  Final    <10,000 COLONIES/mL INSIGNIFICANT GROWTH Performed at [facility]    Report Status 11/16/2016 FINAL  Final  MRSA PCR Screening     Status: None   Collection Time: 11/15/16  3:22 PM  Result Value Ref Range Status   MRSA by PCR NEGATIVE NEGATIVE Final    Comment:        The GeneXpert MRSA Assay (FDA approved for NASAL specimens only), is one component of a comprehensive MRSA colonization surveillance program. It is not intended to diagnose MRSA infection nor to guide or monitor treatment for MRSA infections.       Lab Results  Component Value Date   CALCIUM 8.1 (L) 11/24/2016   PHOS 2.3 (L) 11/24/2016               Impression: 1)Renal  AKI secondary to ATN                AKI sec to SIRS/ hemodynamic instability. Vs CKD progression               AKI on CKD               CKD stage 3/4.                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                            Renal transplant                  Pt is now on [place] , steriods and Mycophenolate                  2) HTN BP stable Medication- On Diuretics On Calcium channel blockers On Vasodilators  3)Anemia HGb not at goal (9--11) Iron deficincy  4)CKD Mineral-Bone Disorder Calcium  Phosphorus at goal   5)ID-admitted with cellulitis/vs infected bakers cysts On abx PMD following  6)Electrolytes  Normokalemic  Hyponatremic    minimal  7)Acid base Co2 at goal  8) CVS- admitted kith Afib w RVR     On IV heparin and Cardizem     Cardiology and primary team is following     Plan:  Will continue current care     BHUTANI,MANPREET S 11/24/2016, 11:52 AM
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
## 2016-11-25 LAB — CBC
HEMATOCRIT: 27.3 % — AB (ref 39.0–52.0)
Hemoglobin: 8.4 g/dL — ABNORMAL LOW (ref 13.0–17.0)
MCH: 27.3 pg (ref 26.0–34.0)
MCHC: 30.8 g/dL (ref 30.0–36.0)
MCV: 88.6 fL (ref 78.0–100.0)
PLATELETS: 514 10*3/uL — AB (ref 150–400)
RBC: 3.08 MIL/uL — AB (ref 4.22–5.81)
RDW: 18 % — ABNORMAL HIGH (ref 11.5–15.5)
WBC: 16.5 10*3/uL — AB (ref 4.0–10.5)

## 2016-11-25 LAB — INFLUENZA PANEL BY PCR (TYPE A & B)
INFLBPCR: NEGATIVE
Influenza A By PCR: NEGATIVE

## 2016-11-25 LAB — BASIC METABOLIC PANEL
Anion gap: 7 (ref 5–15)
BUN: 45 mg/dL — ABNORMAL HIGH (ref 6–20)
CHLORIDE: 106 mmol/L (ref 101–111)
CO2: 21 mmol/L — ABNORMAL LOW (ref 22–32)
Calcium: 8.3 mg/dL — ABNORMAL LOW (ref 8.9–10.3)
Creatinine, Ser: 2.69 mg/dL — ABNORMAL HIGH (ref 0.61–1.24)
GFR, EST AFRICAN AMERICAN: 31 mL/min — AB (ref >60)
GFR, EST NON AFRICAN AMERICAN: 26 mL/min — AB (ref >60)
Glucose, Bld: 94 mg/dL (ref 65–99)
POTASSIUM: 4.4 mmol/L (ref 3.5–5.1)
SODIUM: 134 mmol/L — AB (ref 135–145)

## 2016-11-25 LAB — PROTIME-INR
INR: 1.55
Prothrombin Time: 18.7 seconds — ABNORMAL HIGH (ref 11.4–15.2)

## 2016-11-25 MED ORDER — MYCOPHENOLATE MOFETIL 250 MG PO CAPS: 750.0000 mg | ORAL_CAPSULE | Freq: Two times a day (BID) | ORAL | 3 refills | Status: DC

## 2016-11-25 MED ORDER — METOPROLOL TARTRATE 100 MG PO TABS: 100.0000 mg | ORAL_TABLET | Freq: Two times a day (BID) | ORAL | 3 refills | Status: DC

## 2016-11-25 MED ORDER — AMIODARONE HCL 400 MG PO TABS
400.0000 mg | ORAL_TABLET | Freq: Two times a day (BID) | ORAL | 3 refills | Status: DC
Start: 2016-11-25 — End: 2017-09-02

## 2016-11-25 MED ORDER — ASPIRIN 81 MG PO CHEW: 81.0000 mg | CHEWABLE_TABLET | Freq: Every day | ORAL | Status: DC

## 2016-11-25 NOTE — Progress Notes (Signed)
Pt. C/o "chills". Temp 99.7. Tylenol po given.
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
## 2016-11-25 NOTE — Progress Notes (Signed)
Physical Therapy Treatment Patient Details Name: [name] MRN: [id number] DOB: 1966-03-10 Today's Date: 11/25/2016    History of Present Illness  [name]  is a 51 y.o. male, w Atrial fibrillation , Hypertension, CHF ? EF, CAD, renal transplant who presented with c/o fever x1 day,  Pt notes that his left leg been swollen x4 days.  Pt had evaluation in ED, at [facility]  Per pt no blood clot.  Pt sent home.     PT Comments    Pt with much improved mobility and endurance this session.  Noted reduction in swelling in Lt calf.  Pt ambulated increased distance of 120 feet using RW.  One standing rest break with some SOB noted at midway mark.  Pt able to decrease breathing with proper technique indpendently.  Pt returned to [facility] to eat breakfast.  Instructed with self gastroc stretch using sheet.  Pt reported no pain at end of session.  Follow Up Recommendations        Equipment Recommendations       Recommendations for Other Services       Precautions / Restrictions Precautions Precautions: None Restrictions Weight Bearing Restrictions: No LLE Weight Bearing: Weight bearing as tolerated    Mobility  Bed Mobility Overal bed mobility: Independent                Transfers Overall transfer level: Modified independent Equipment used: Rolling walker (2 wheeled) Transfers: Sit to/from Stand Sit to Stand: Independent            Ambulation/Gait Ambulation/Gait assistance: Supervision Ambulation Distance (Feet): 120 Feet Assistive device: Rolling walker (2 wheeled) Gait Pattern/deviations: Step-to pattern;Decreased stance time - left         Stairs            Wheelchair Mobility    Modified Rankin (Stroke Patients Only)       Balance                                    Cognition Arousal/Alertness: Awake/alert Behavior During Therapy: WFL for tasks assessed/performed Overall Cognitive Status: Within Functional Limits  for tasks assessed                      Exercises General Exercises - Lower Extremity Ankle Circles/Pumps: AROM;Both;10 reps;Seated Self gastroc stretch using sheet, 3X20" P/ROM gastroc stretch 3X20"   General Comments        Pertinent Vitals/Pain Pain Assessment: No/denies pain    Home Living                      Prior Function            PT Goals (current goals can now be found in the care plan section)      Frequency           PT Plan Current plan remains appropriate    Co-evaluation             End of Session Equipment Utilized During Treatment: Gait belt Activity Tolerance: Patient tolerated treatment well Patient left: in bed;with call bell/phone within reach     Time: 0840-0900 PT Time Calculation (min) (ACUTE ONLY): 20 min  Charges:  $Gait Training: 8-22 mins                      Teena Irani, PTA/CLT [phone] 11/25/2016,  9:11 AM

## 2016-11-25 NOTE — Progress Notes (Signed)
Subjective: Interval History: feeling week other wise no new complaint  Objective: Vital signs in last 24 hours: Temp:  [98.1 F (36.7 C)-99.7 F (37.6 C)] 99.7 F (37.6 C) (01/11 0938) Pulse Rate:  [87-117] 100 (01/11 0938) Resp:  [20-24] 22 (01/11 0938) BP: (102-140)/(36-91) 140/91 (01/11 0938) SpO2:  [90 %-98 %] 90 % (01/11 0938) Weight:  [131 kg (288 lb 14.4 oz)] 131 kg (288 lb 14.4 oz) (01/11 0546) Weight change: -0.227 kg (-8 oz)  Intake/Output from previous day: 01/10 0701 - 01/11 0700 In: 720 [P.O.:720] Out: 900 [Urine:900] Intake/Output this shift: No intake/output data recorded.  . Generally patient is alert and in upper and distress. The chest is clear to auscultation Heart exam regular rate and rhythm Extremities no edema  Lab Results:  Recent Labs  11/24/16 0546 11/25/16 0549  WBC 17.4* 16.5*  HGB 8.4* 8.4*  HCT 27.7* 27.3*  PLT 480* 514*   BMET:   Recent Labs  11/24/16 0546 11/25/16 0549  NA 133* 134*  K 4.4 4.4  CL 105 106  CO2 20* 21*  GLUCOSE 90 94  BUN 44* 45*  CREATININE 2.84* 2.69*  CALCIUM 8.1* 8.3*   No results for input(s): PTH in the last 72 hours. Iron Studies:  No results for input(s): IRON, TIBC, TRANSFERRIN, FERRITIN in the last 72 hours.  Studies/Results: No results found.  I have reviewed the patient's current medications.  Assessment/Plan: Problem #1 acute kidney injury superimposed on chronic. This could be secondary to prerenal syndrome versus ATN. Patient is non-oliguric. His creatinine continued to decline. Presently reaching his baseline. Patient is asymptomatic. 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 Problem #3 hematoma/cellulitis on his left leg. Presently he is on antibiotics. Patient claims he is feeling better. H Problem #4 history of hypertension: His blood pressure is reasonably controlled. Problem #5 history of atrial  fibrillation: His heart rate is controlled Problem #6 history of coronary artery disease Problem #7 anemia: Iron deficiency anemia. Patient is started on iron supplement.. Problem #8 metabolic bone disease: His calcium and phosphorus is range. Plan:. 1] continue his present management 2] we'll check his renal panel.  3] if patient is going to be discharged will be followed by his transplant nephrologist as an outpatient.   LOS: 10 days   Roston Grunewald S 11/25/2016,9:53 AM

## 2017-07-11 ENCOUNTER — Encounter: Payer: Self-pay | Admitting: Gastroenterology

## 2017-09-02 ENCOUNTER — Encounter (INDEPENDENT_AMBULATORY_CARE_PROVIDER_SITE_OTHER): Payer: Self-pay

## 2017-09-02 ENCOUNTER — Encounter: Payer: Self-pay | Admitting: Gastroenterology

## 2017-09-02 ENCOUNTER — Ambulatory Visit (INDEPENDENT_AMBULATORY_CARE_PROVIDER_SITE_OTHER): Payer: Medicare Other | Admitting: Gastroenterology

## 2017-09-02 ENCOUNTER — Other Ambulatory Visit: Payer: Medicare Other

## 2017-09-02 VITALS — HR 122 | Ht 71.5 in | Wt 289.5 lb

## 2017-09-02 DIAGNOSIS — R140 Abdominal distension (gaseous): Secondary | ICD-10-CM | POA: Diagnosis not present

## 2017-09-02 DIAGNOSIS — K219 Gastro-esophageal reflux disease without esophagitis: Secondary | ICD-10-CM | POA: Diagnosis not present

## 2017-09-02 DIAGNOSIS — R197 Diarrhea, unspecified: Secondary | ICD-10-CM | POA: Diagnosis not present

## 2017-09-02 DIAGNOSIS — Z7901 Long term (current) use of anticoagulants: Secondary | ICD-10-CM

## 2017-09-02 NOTE — Patient Instructions (Addendum)
We will attempt to obtain your records from your Cardiologist, primary care physician, nephrologist and previous gastroenterologist.   Your physician has requested that you go to the basement for the following lab work before leaving today: GI pathogen panel.   Stop taking Mag-ox.   Start a lactose free diet x 1 week to this if this helps improve your symptoms.   Schedule a office visit as soon as possible with your cardiologist for volume over load.   Thank you for choosing me and [facility].  Pricilla Riffle. Dagoberto Ligas., MD., Marval Regal

## 2017-09-02 NOTE — Progress Notes (Addendum)
History of Present Illness: This is a 51 year old male here for the evaluation of abdominal bloating and alternating diarrhea and constipation. He is accompanied by his wife. He has afib, HTN, CHF, s/p renal transplant on chronic immunosuppressants and chronic anticoagulation. Hospitalized at [facility] and then at [facility] for 3 weeks. He is followed at [facility] following renal transplant. His cardiologist is Dr. Corena Pilgrim in [place]. His primary care is Dr. Brigitte Pulse in [place]. He had previous endoscopy and colonoscopy by Drs. Shifflett and [facility] in [place] about 10 years ago. Unfortunately I do not have records available from these providers. He was able to provide some blood work performed through [facility] on September 17 however most of the values do not have reference ranges. Alk phos=207, ALT=24, AST=22, total bili=0.8, creatinine=3.72, sodium=147, albumin=3.9, WBC=9.0, HB=11.4, MCV=76, plt=238. Patient relates that since his hospitalizations earlier this year he has had problems with diarrhea. He occasionally will have constipation but generally has several loose stools each day. This bowel pattern has worsened over the past 2 months. He feels constantly bloated. This is unrelated to meals. He relates reflux symptoms that are generally well controlled on omeprazole. Denies weight loss, abdominal pain, change in stool caliber, melena, hematochezia, nausea, vomiting, dysphagia, chest pain.  Allergies  Allergen Reactions  . Vancomycin Swelling   Outpatient Medications Prior to Visit  Medication Sig Dispense Refill  . atorvastatin (LIPITOR) 80 MG tablet Take 80 mg by mouth daily.    . calcitRIOL (ROCALTROL) 0.5 MCG capsule Take 0.5 mcg by mouth daily.    . calcium carbonate 1250 MG capsule Take 1,800 mg by mouth 2 (two) times daily with a meal.    . magnesium oxide (MAG-OX) 400 MG tablet Take 800 mg by mouth 2 (two) times daily.     . metoprolol (LOPRESSOR) 100 MG tablet Take 1 tablet (100 mg total) by mouth 2  (two) times daily. 60 tablet 3  . Multiple Vitamins-Minerals (MULTIVITAMIN WITH MINERALS) tablet Take 1 tablet by mouth daily.    . mycophenolate (CELLCEPT) 250 MG capsule Take 3 capsules (750 mg total) by mouth 2 (two) times daily. (Patient taking differently: Take 250 mg by mouth 2 (two) times daily. ) 180 capsule 3  . omeprazole (PRILOSEC) 40 MG capsule Take 40 mg by mouth daily.    . predniSONE (DELTASONE) 10 MG tablet Take 10 mg by mouth daily with breakfast.    . tacrolimus (PROGRAF) 1 MG capsule Take 1 mg by mouth 2 (two) times daily.    . tamsulosin (FLOMAX) 0.4 MG CAPS capsule Take 0.4 mg by mouth daily.     Marland Kitchen warfarin (COUMADIN) 6 MG tablet Take 5 mg by mouth as directed. 6 mg Sat/Sun,  7 mg Mon-Friday     . amiodarone (PACERONE) 400 MG tablet Take 1 tablet (400 mg total) by mouth 2 (two) times daily. 60 tablet 3  . aspirin 81 MG chewable tablet Chew 1 tablet (81 mg total) by mouth daily.     No facility-administered medications prior to visit.    Past Medical History:  Diagnosis Date  . Atrial fibrillation ([place])   . CHF (congestive heart failure) ([place])   . Chronic kidney disease   . Coronary artery disease   . HLD (hyperlipidemia)   . Hypertension   . Kidney transplanted   . Pneumonia   . Sleep apnea    CPAP   Past Surgical History:  Procedure Laterality Date  . ABLATION    . CHOLECYSTECTOMY    .  HERNIA MESH REMOVAL     abdominal  . TRANSPLANTATION RENAL  2005   Social History   Social History  . Marital status: Married    Spouse name: N/A  . Number of children: 4  . Years of education: N/A   Occupational History  . partial disabled   . host    Social History Main Topics  . Smoking status: Never Smoker  . Smokeless tobacco: Never Used  . Alcohol use No  . Drug use: No  . Sexual activity: Not Asked   Other Topics Concern  . None   Social History Narrative  . None   Family History  Problem Relation Age of Onset  . Hypertension Mother   . Heart  attack Father   . Hypertension Maternal Grandmother   . Stroke Maternal Grandfather   . Heart attack Paternal Uncle   . Heart attack Paternal Uncle   . Heart attack Paternal Aunt   . Stroke Maternal Uncle       Review of Systems: Pertinent positive and negative review of systems were noted in the above HPI section. All other review of systems were otherwise negative.    Physical Exam: General: Well developed, well nourished, chronically ill appearing, obese, no acute distress Head: Normocephalic and atraumatic Eyes:  sclerae anicteric, EOMI Ears: Normal auditory acuity Mouth: No deformity or lesions Neck: Supple, no masses or thyromegaly Lungs: Clear throughout to auscultation Heart: Regular rate and rhythm; no murmurs, rubs or bruits Abdomen: Soft, non tender and non distended. Pitting edema across entire abdomen. No masses, hepatosplenomegaly or hernias noted. Normal Bowel sounds Musculoskeletal: Symmetrical with no gross deformities  Skin: No lesions on visible extremities Pulses:  Normal pulses noted Extremities: No clubbing, cyanosis, edema or deformities noted Neurological: Alert oriented x 4, grossly nonfocal Cervical Nodes:  No significant cervical adenopathy Inguinal Nodes: No significant inguinal adenopathy Psychological:  Alert and cooperative. Normal mood and affect  Assessment and Recommendations:  1. Change in bowel habits with frequent diarrhea. Rule out infections, magnesium oxide side effect, lactose or other food intolerance. Discontinue magnesium oxide for several days and assess response. If no improvement discontinue iron for several days and assess response. If no improvement begin a strict lactose-free diet for 7 days. Stool for GI pathogen panel. Request records from all healthcare providers listed in HPI. REV in 1 month.   2. Abdominal bloating with evidence of anasarca. Strongly advised him to seek care with his cardiologist, Dr. Corena Pilgrim, in the next  few days for further evaluation.  3. GERD. Continue omeprazole 40 mg daily. Follow standard antireflux measures.  4. Possible elevated alk phos. Await records with reference ranges for blood work.   5. Status post renal transplant on chronic immunosuppression.  6. Atrial fibrillation maintained on Coumadin.

## 2017-09-05 ENCOUNTER — Other Ambulatory Visit: Payer: Medicare Other

## 2017-09-05 DIAGNOSIS — R140 Abdominal distension (gaseous): Secondary | ICD-10-CM

## 2017-09-05 DIAGNOSIS — R197 Diarrhea, unspecified: Secondary | ICD-10-CM

## 2017-09-08 LAB — GASTROINTESTINAL PATHOGEN PANEL PCR
C. difficile Tox A/B, PCR: NOT DETECTED
Campylobacter, PCR: NOT DETECTED
Cryptosporidium, PCR: NOT DETECTED
E COLI (ETEC) LT/ST, PCR: NOT DETECTED
E COLI 0157, PCR: NOT DETECTED
E coli (STEC) stx1/stx2, PCR: NOT DETECTED
Giardia lamblia, PCR: NOT DETECTED
Norovirus, PCR: NOT DETECTED
Rotavirus A, PCR: NOT DETECTED
SALMONELLA, PCR: NOT DETECTED
SHIGELLA, PCR: NOT DETECTED

## 2017-10-04 ENCOUNTER — Ambulatory Visit: Payer: Medicare Other | Admitting: Gastroenterology

## 2017-11-24 ENCOUNTER — Ambulatory Visit: Payer: Medicare Other | Admitting: Gastroenterology

## 2018-08-31 IMAGING — US US EXTREM LOW VENOUS BILAT
1 series · 12 of 24 positions shown · non-contrast
Comparison: None.

CLINICAL DATA: Right lower extremity pain and edema. History of
smoking. Evaluate for DVT.



[Series 1: us extrem low venous bilat · 12 of 73 slices shown]
[im 4/73]
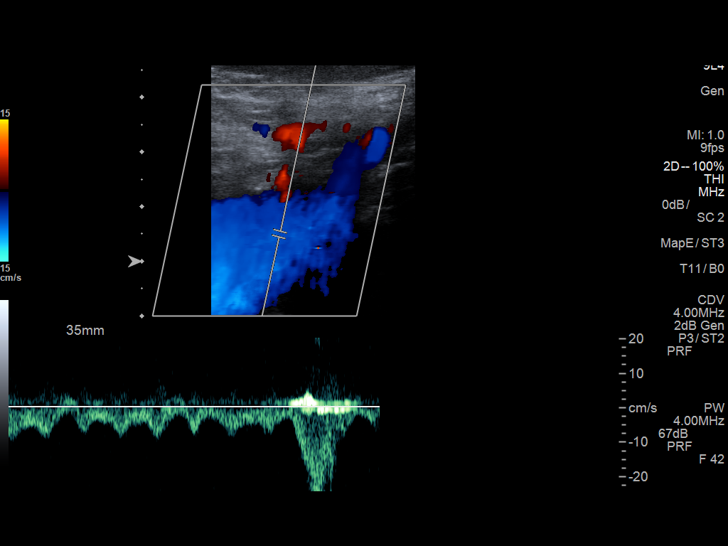
[im 10/73]
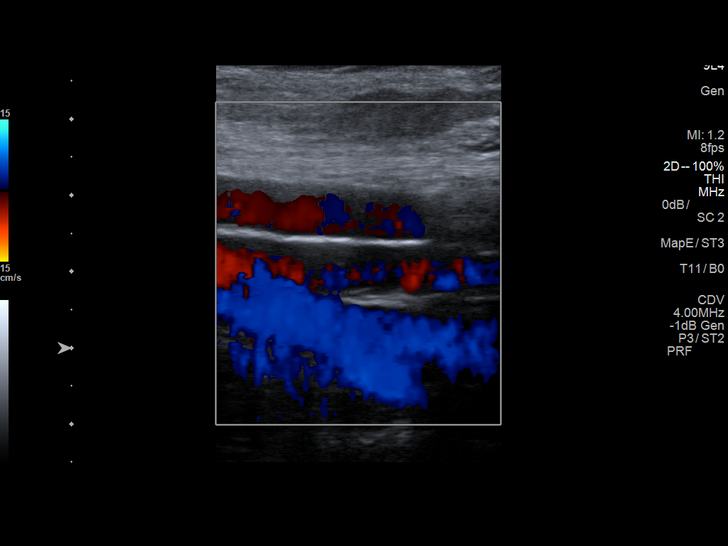
[im 16/73]
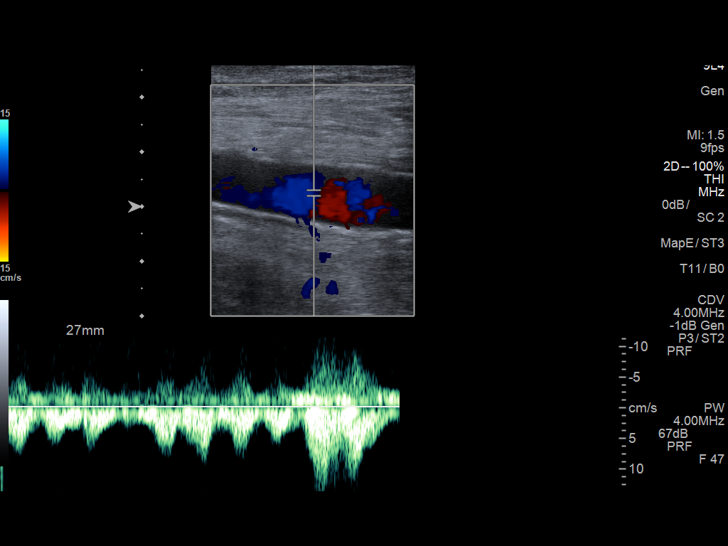
[im 22/73]
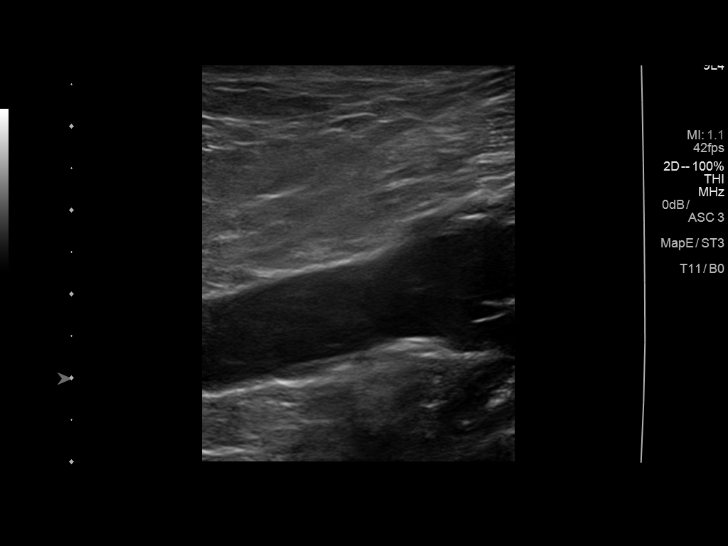
[im 29/73]
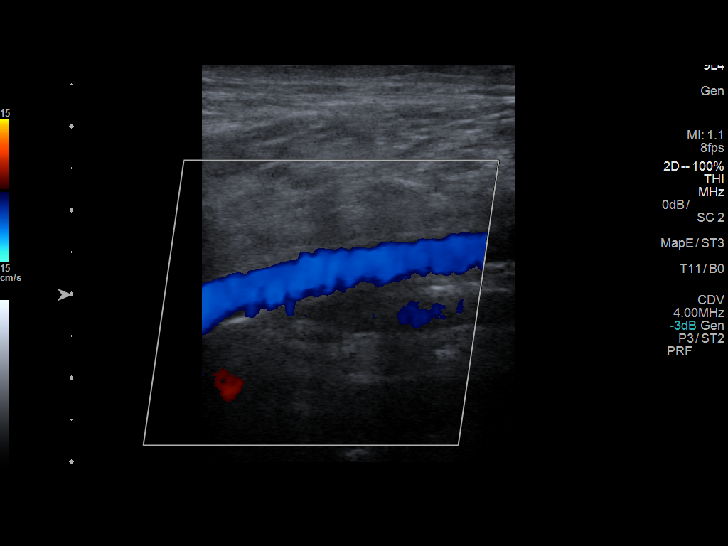
[im 35/73]
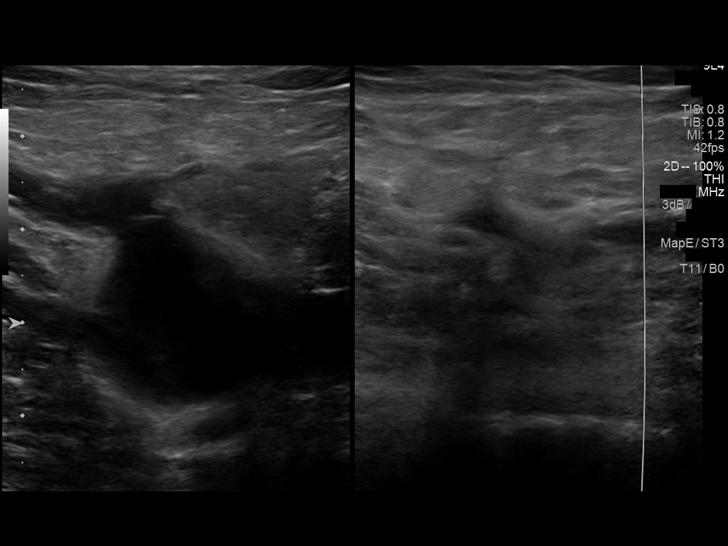
[im 41/73]
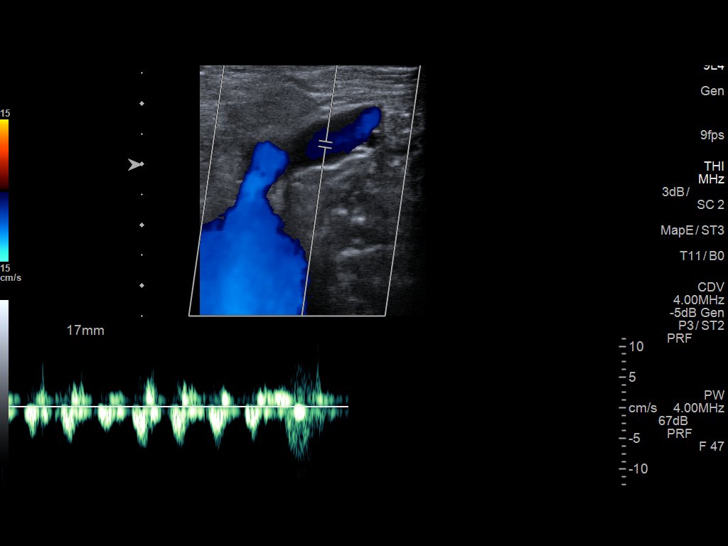
[im 47/73]
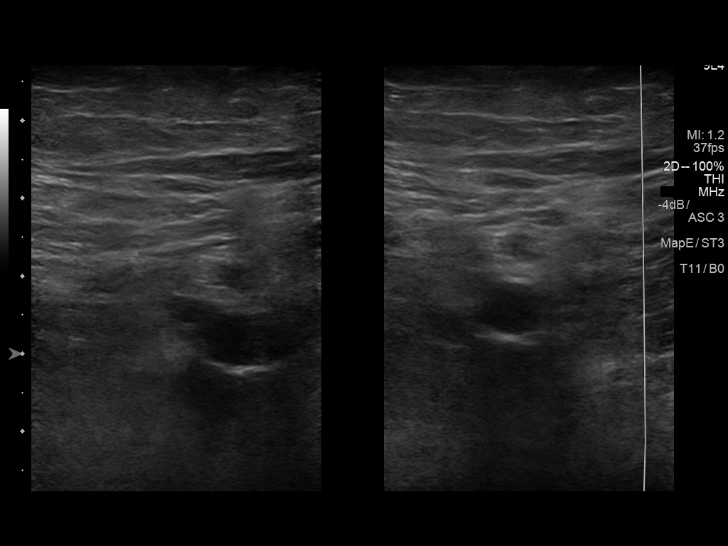
[im 54/73]
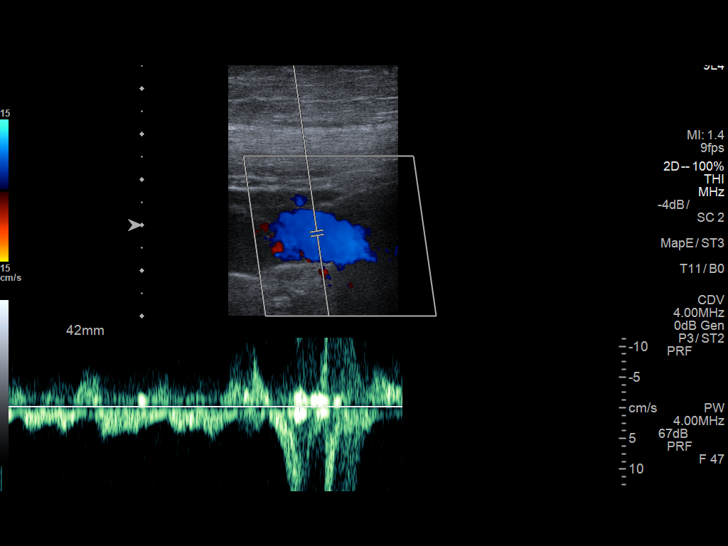
[im 60/73]
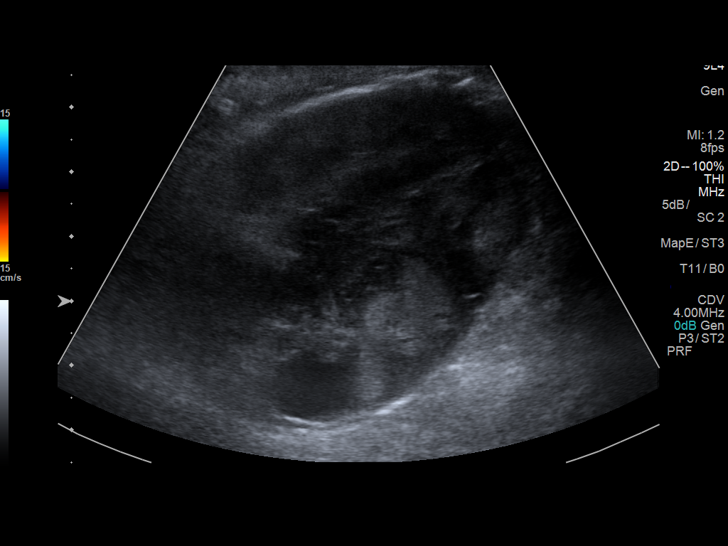
[im 66/73]
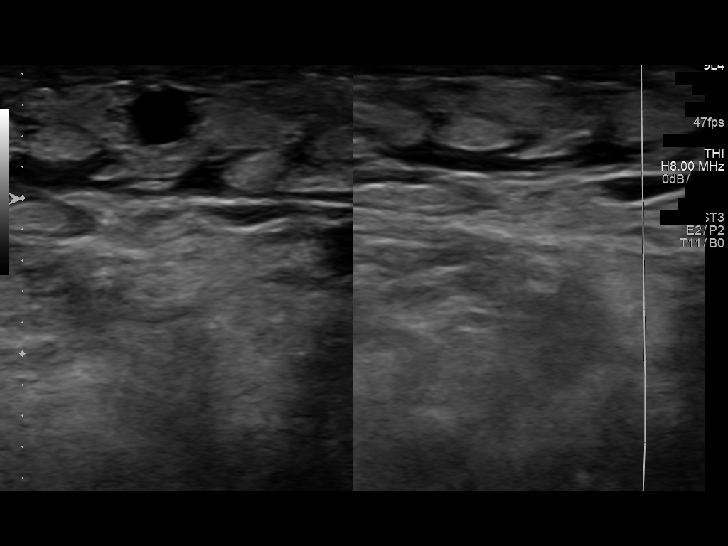
[im 73/73]
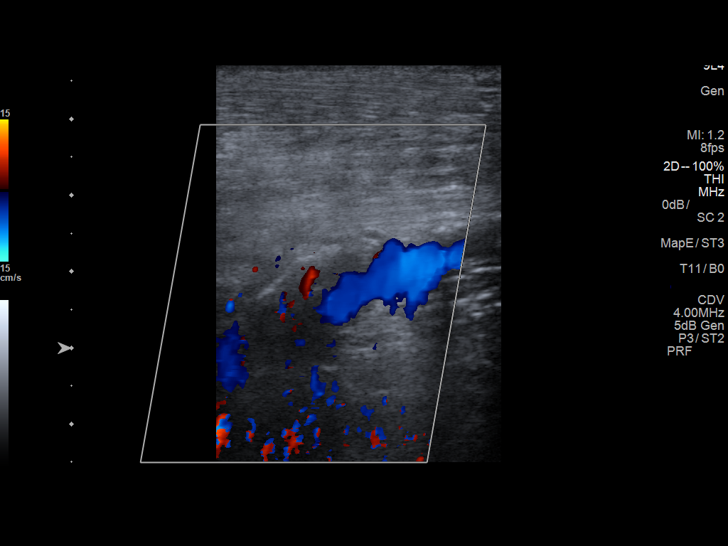

[12 of 24 positions shown; findings below may reference images not displayed]

FINDINGS: RIGHT LOWER EXTREMITY

Common Femoral Vein: No evidence of thrombus. Normal
compressibility, respiratory phasicity and response to augmentation.

Saphenofemoral Junction: No evidence of thrombus. Normal
compressibility and flow on color Doppler imaging.

Profunda Femoral Vein: No evidence of thrombus. Normal
compressibility and flow on color Doppler imaging.

Femoral Vein: No evidence of thrombus. Normal compressibility,
respiratory phasicity and response to augmentation.

Popliteal Vein: No evidence of thrombus. Normal compressibility,
respiratory phasicity and response to augmentation.

Calf Veins: No evidence of thrombus. Normal compressibility and flow
on color Doppler imaging.

Superficial Great Saphenous Vein: No evidence of thrombus. Normal
compressibility and flow on color Doppler imaging.

Venous Reflux:  None.

Other Findings:  None.

LEFT LOWER EXTREMITY

Common Femoral Vein: No evidence of thrombus. Normal
compressibility, respiratory phasicity and response to augmentation.

Saphenofemoral Junction: No evidence of thrombus. Normal
compressibility and flow on color Doppler imaging.

Profunda Femoral Vein: No evidence of thrombus. Normal
compressibility and flow on color Doppler imaging.

Femoral Vein: No evidence of thrombus. Normal compressibility,
respiratory phasicity and response to augmentation.

Popliteal Vein: There is age-indeterminate mixed echogenic
nonocclusive thrombus within the left popliteal vein (images 50 and
59) which appears to contain an internal echogenic linear septation.

Calf Veins: No evidence of thrombus. Normal compressibility and flow
on color Doppler imaging.

Superficial Great Saphenous Vein: No evidence of thrombus. Normal
compressibility and flow on color Doppler imaging.

Venous Reflux:  None.

Other Findings: Mixed echogenic complex fluid collection within the
left popliteal fossa measuring at least 6.1 x 6.9 x 5.9 cm a
moderate amount of subcutaneous edema is noted at the level of the
left calf and lower leg (images 69 through 71).
IMPRESSION: 1. Age-indeterminate though potentially chronic nonocclusive DVT
within the left popliteal vein.
2. No evidence of DVT within the right lower extremity.
3. Complex left-sided Baker cyst measuring at least 6.9 cm in
diameter.

## 2018-09-05 IMAGING — US US RENAL TRANSPLANT
2 series · 13 of 25 positions shown · non-contrast
Comparison: None.

CLINICAL DATA: 50-year-old male with acute tubular necrosis of a
transplanted kidney

EXAM:
ULTRASOUND OF RENAL TRANSPLANT WITH RENAL DOPPLER ULTRASOUND
TECHNIQUE: Ultrasound examination of the renal transplant was performed with
gray-scale, color and duplex doppler evaluation.

[Series 1: us renal transplant · 0.25mm/px · 12 of 43 slices shown (1 of 2)]
[im 1/43]
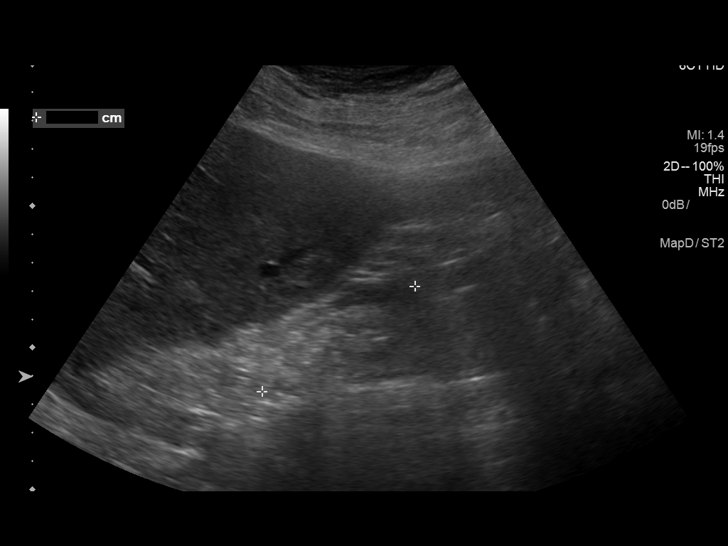
[im 4/43]
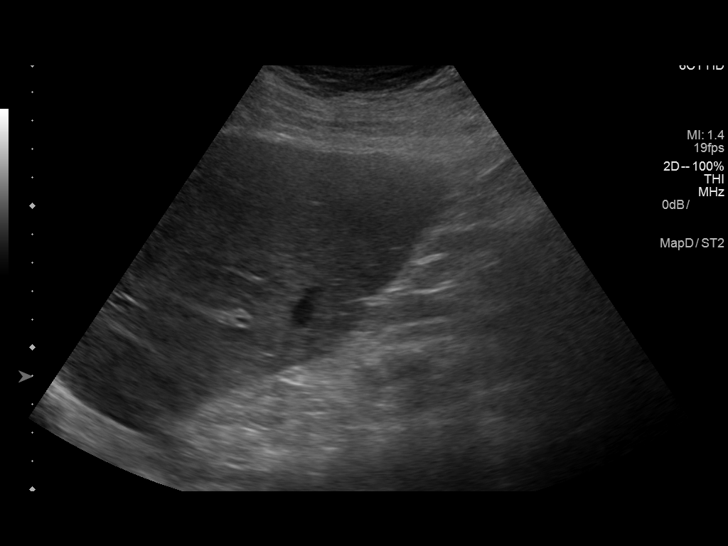
[im 8/43]
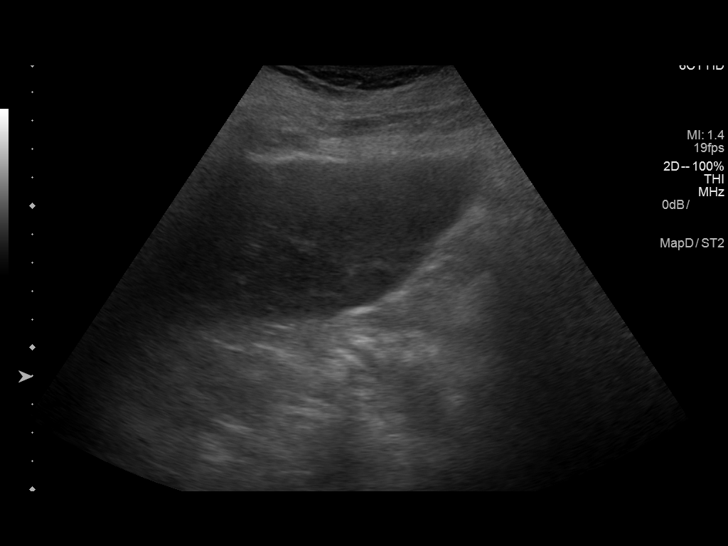
[im 12/43]
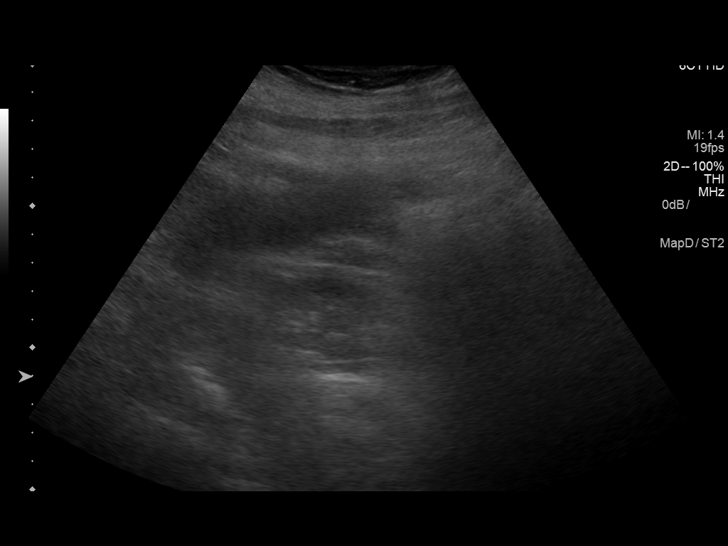
[im 16/43]
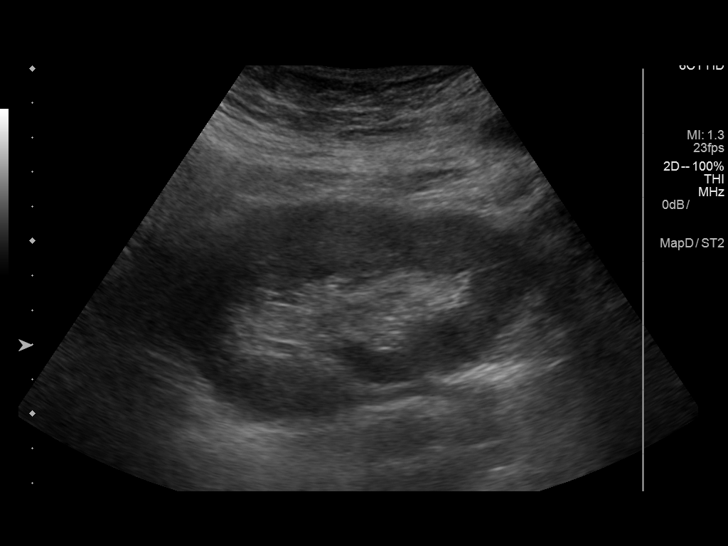
[im 20/43]
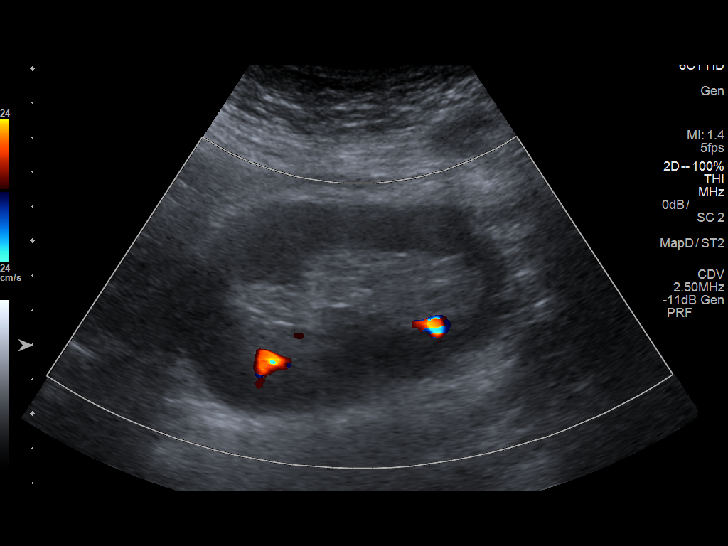
[im 23/43]
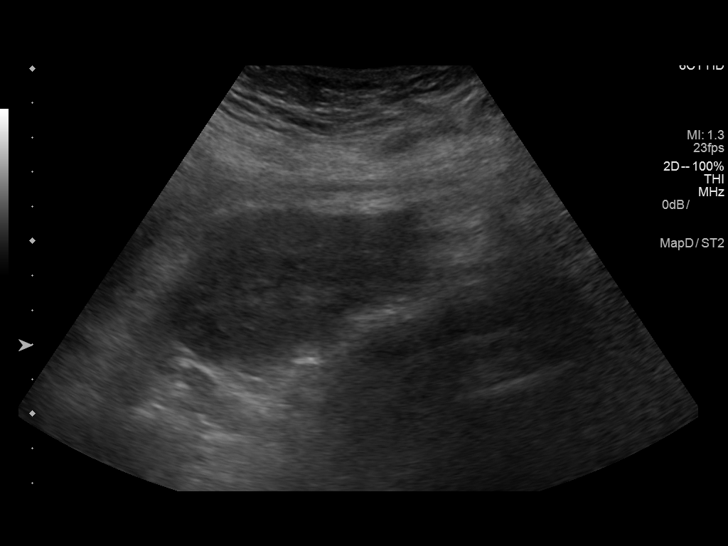
[im 27/43]
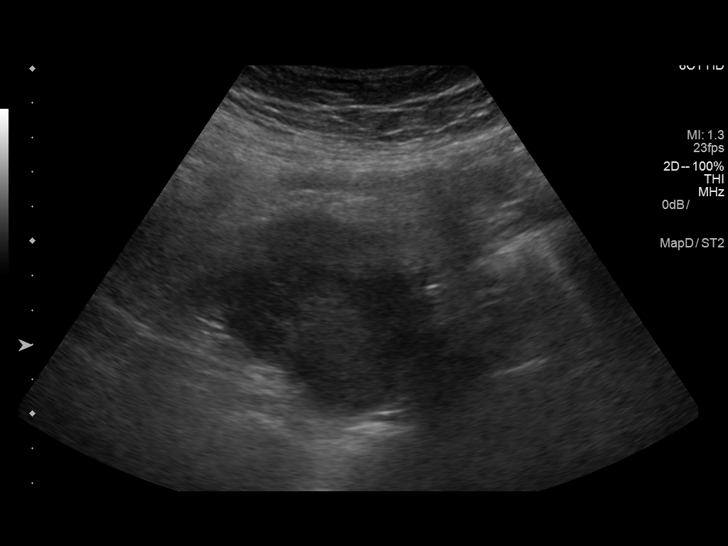
[im 31/43]
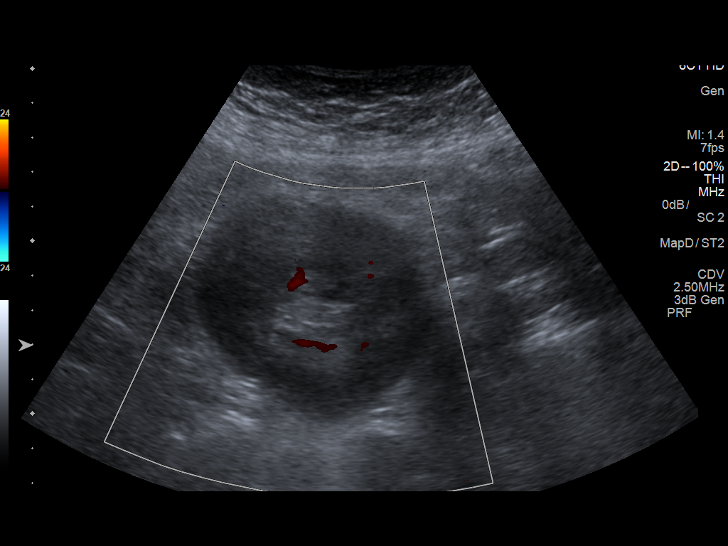
[im 35/43]
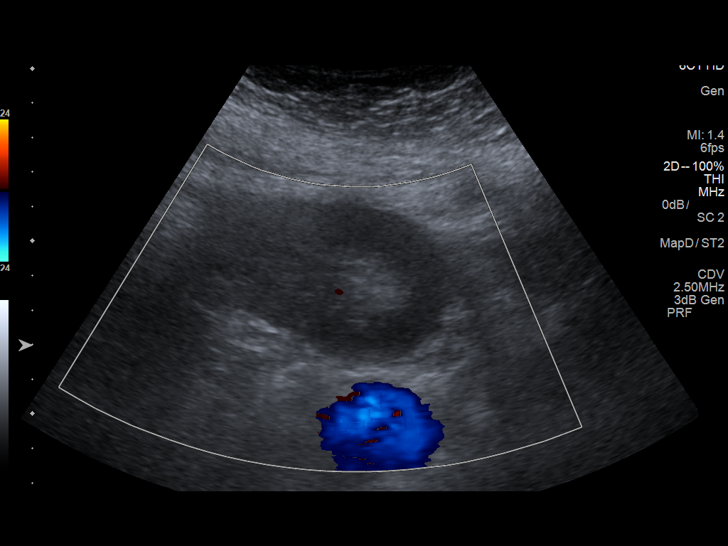
[im 39/43]
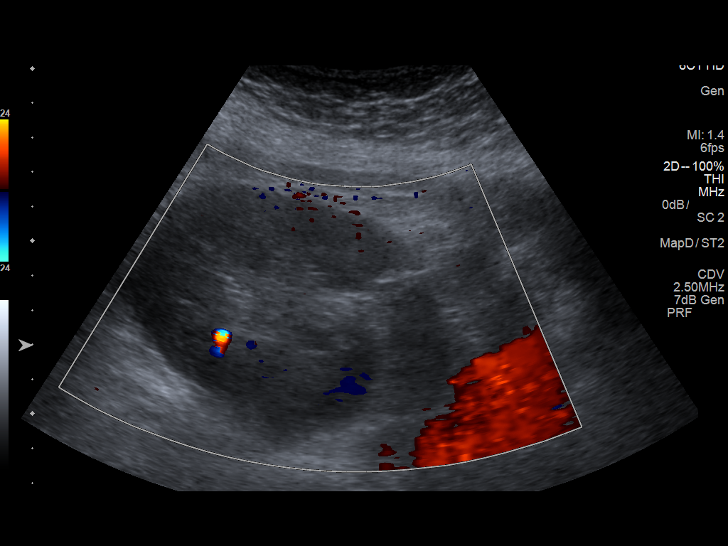
[im 43/43]
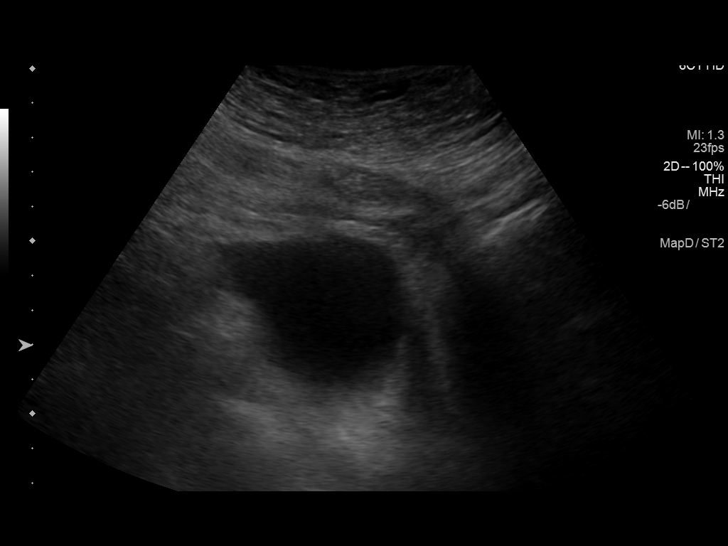

[Series 2001: us renal transplant · 1 of 4 slices shown (2 of 2)]
[im 4/4]
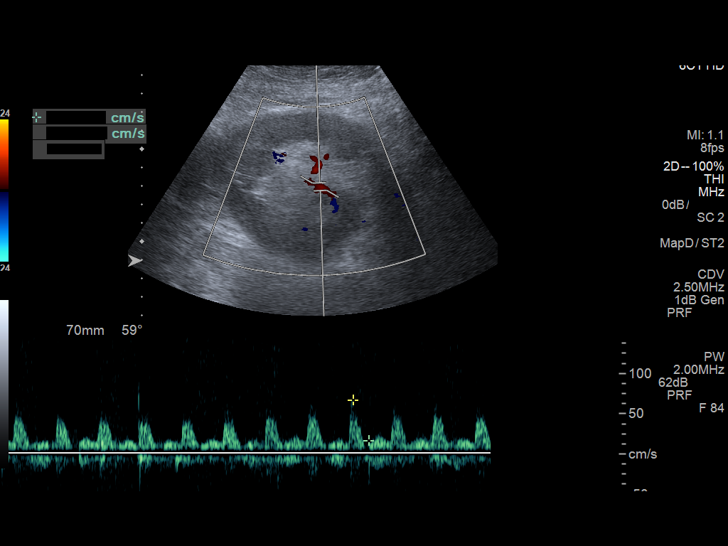

[13 of 25 positions shown; findings below may reference images not displayed]

FINDINGS: Transplant kidney location: Right lower quadrant

Transplant Kidney:

Length: 11.0 cm. Normal in size and parenchymal echogenicity. No
evidence of mass or hydronephrosis. No peri-transplant fluid
collection seen.

Color flow in the main renal artery:  Yes

Color flow in the main renal vein:  Yes

Duplex Doppler Evaluation:

Main Renal Artery Resistive Index:

Venous waveform in main renal vein:  Present

Intrarenal resistive index in upper pole:

(normal 0.6-0.8; equivocal 0.8-0.9; abnormal >= 0.9)

Intrarenal resistive index in lower pole:

(normal 0.6-0.8; equivocal 0.8-0.9; abnormal >= 0.9)

Bladder: Normal for degree of bladder distention.

Other findings:  None.
IMPRESSION: 1. Right lower quadrant transplant kidney without evidence of
hydronephrosis, mass or other abnormality.
2. The internal resistive indices are within normal limits.
3. The resistive index in the main renal artery does not suggest the
presence of renal artery stenosis.
4. The native right kidney is atrophic.

## 2018-09-07 IMAGING — US US EXTREM LOW VENOUS*L*
1 series · 14 of 24 positions shown · non-contrast
Comparison: MR 11/16/2016, ultrasound 11/15/2016

CLINICAL DATA: Subacute medial left gastrocnemius hematoma.
Popliteal DVT, left.

EXAM:
LEFT LOWER EXTREMITY VENOUS DOPPLER ULTRASOUND
TECHNIQUE: Gray-scale sonography with compression, as well as color and duplex
ultrasound, were performed to evaluate the deep venous system from
the level of the common femoral vein through the popliteal and
proximal calf veins.

[Series 1: us extrem low venous*left* · 0.08mm/px · 14 of 52 slices shown]
[im 1/52]
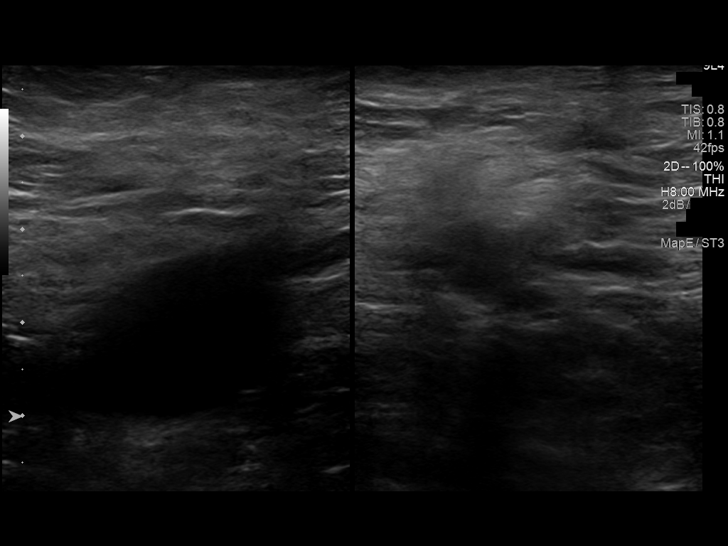
[im 5/52]
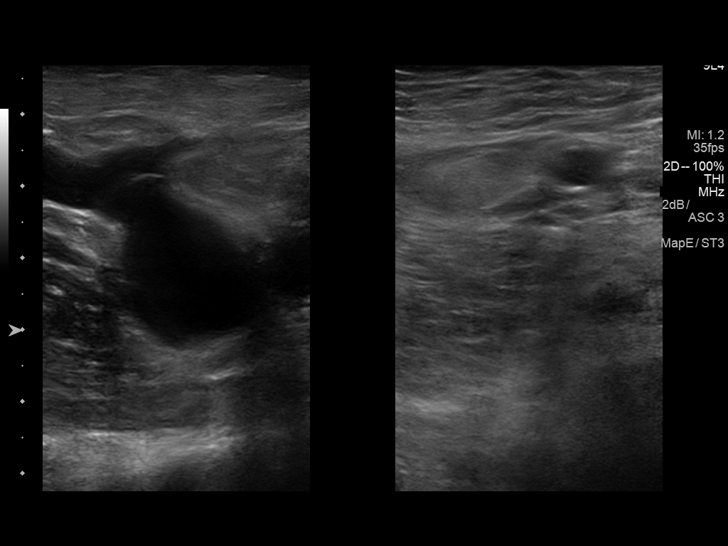
[im 9/52]
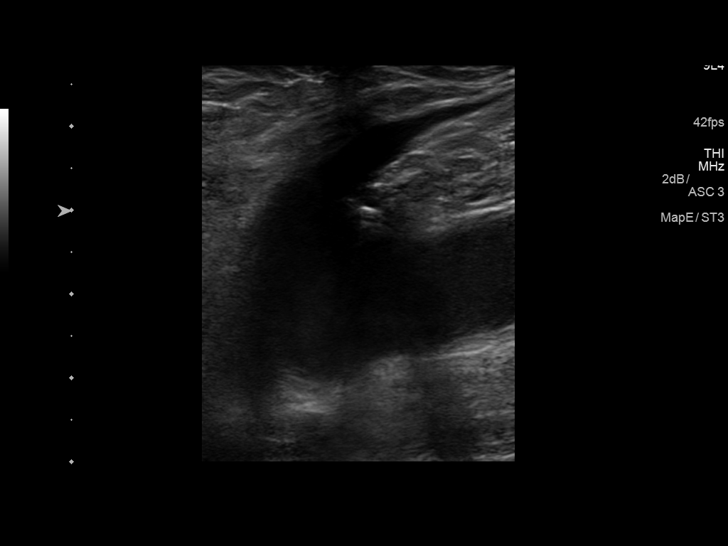
[im 14/52]
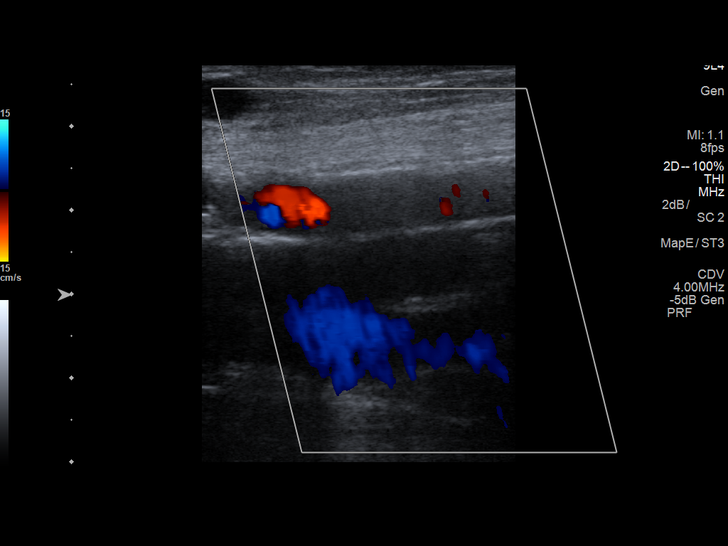
[im 16/52]
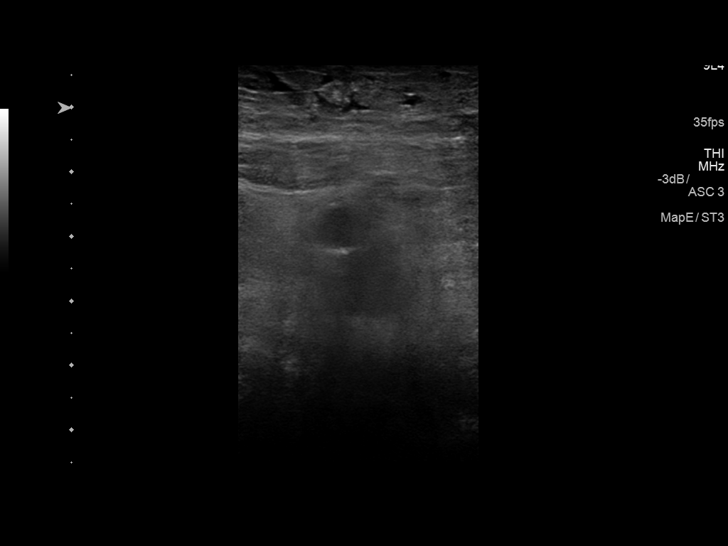
[im 20/52]
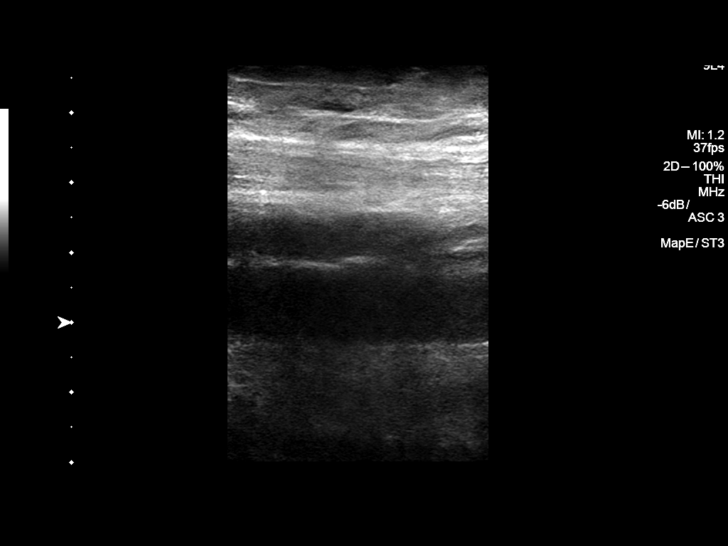
[im 25/52]
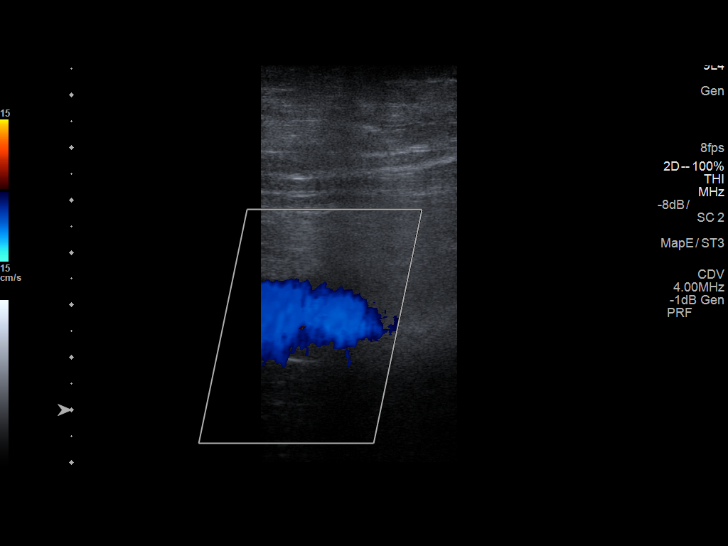
[im 27/52]
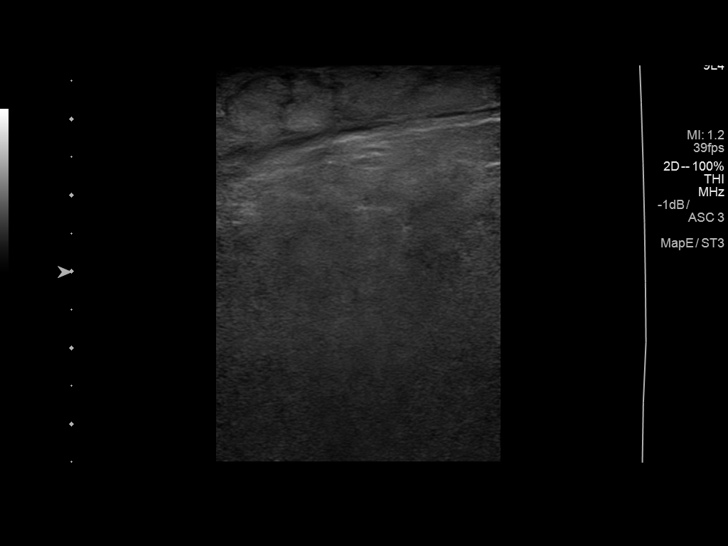
[im 32/52]
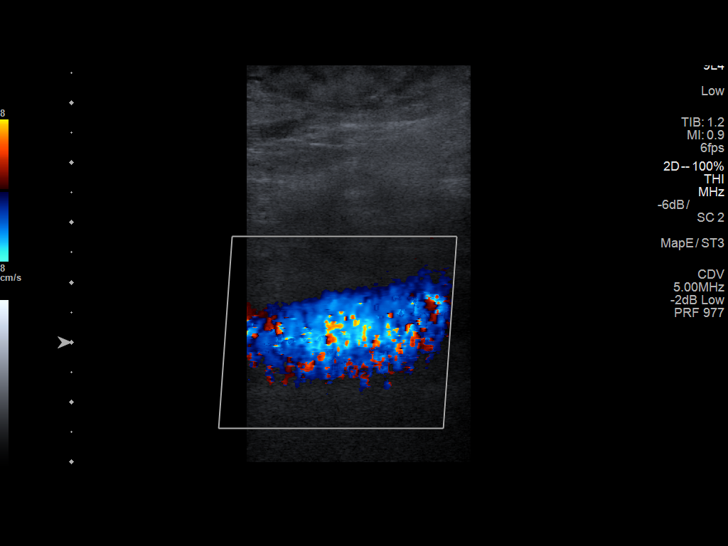
[im 36/52]
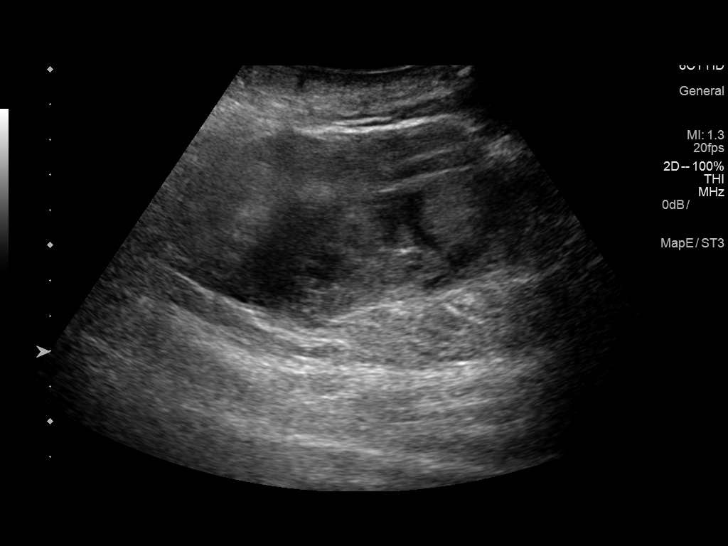
[im 40/52]
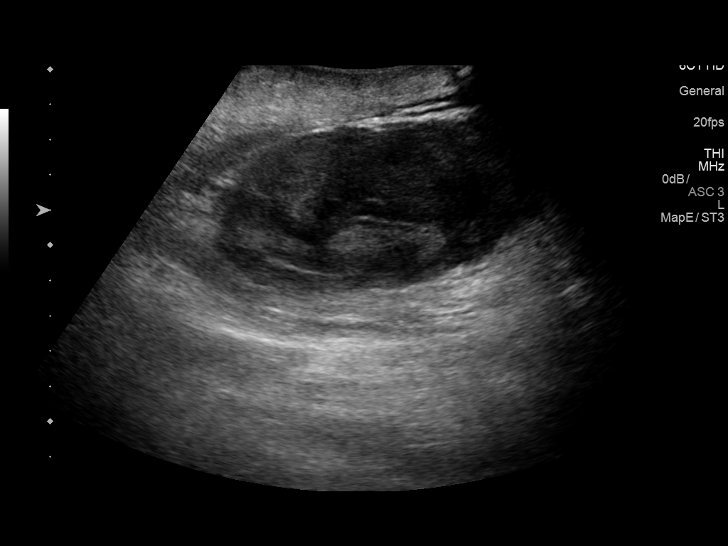
[im 43/52]
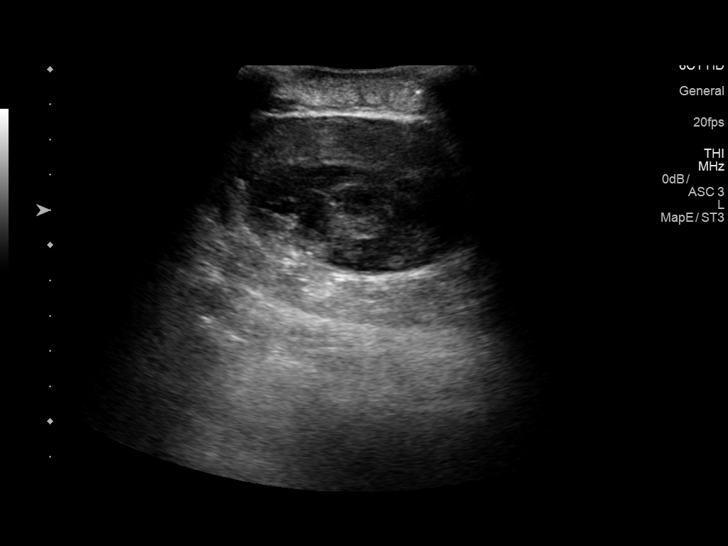
[im 47/52]
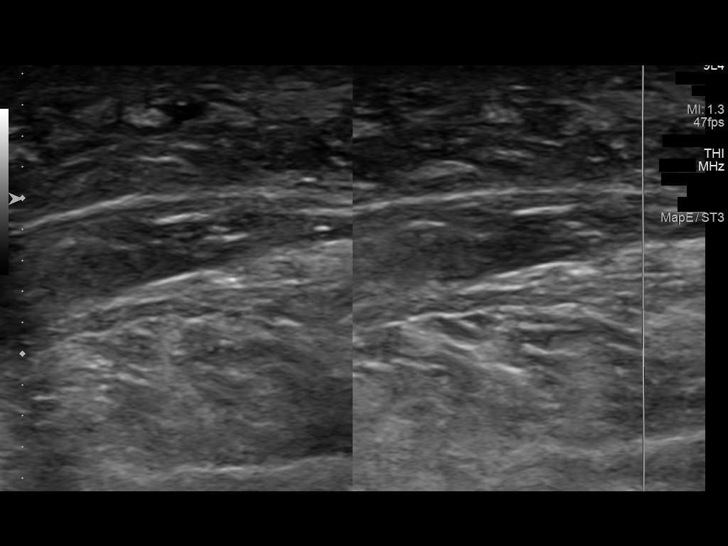
[im 52/52]
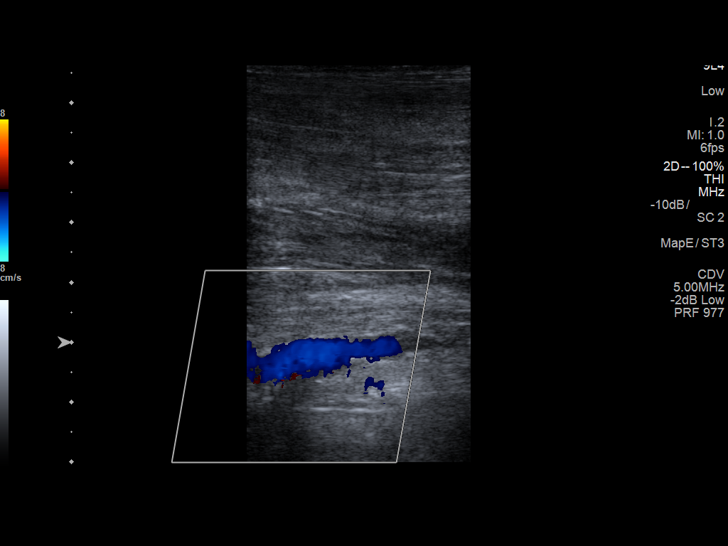

[14 of 24 positions shown; findings below may reference images not displayed]

FINDINGS: Normal compressibility of the common femoral, superficial femoral,
and popliteal veins, as well as the proximal calf veins. No filling
defects to suggest DVT on grayscale or color Doppler imaging.
Doppler waveforms show normal direction of venous flow, normal
respiratory phasicity and response to augmentation. There is
subcutaneous edema in the thigh. Complex 10.3 x 4.7 cm collection in
the left calf. Survey views of the contralateral common femoral vein
are unremarkable.
IMPRESSION: No evidence of lower extremity deep vein thrombosis. The popliteal
thrombus suspected previously is no longer conspicuous.

## 2019-11-16 HISTORY — PX: CARDIAC CATHETERIZATION: SHX172

## 2020-04-22 ENCOUNTER — Encounter: Payer: Self-pay | Admitting: Internal Medicine

## 2020-06-09 ENCOUNTER — Ambulatory Visit: Payer: Medicare Other | Admitting: Gastroenterology

## 2020-06-27 ENCOUNTER — Encounter: Payer: Self-pay | Admitting: Internal Medicine

## 2020-07-31 ENCOUNTER — Ambulatory Visit: Payer: Medicare Other | Admitting: Internal Medicine

## 2020-07-31 ENCOUNTER — Telehealth: Payer: Self-pay

## 2020-07-31 NOTE — Telephone Encounter (Signed)
Pt has an appointment scheduled for next week with Dr. Abbey Chatters. Pt gets diaalysis and they changed the time pt comes in. Pt needs to r/s his apt and wants to still see Dr. Abbey Chatters. Mon, Wed, or Fri after 12 pm, Tues or Thurs 8:00 Apt.

## 2020-08-06 ENCOUNTER — Ambulatory Visit: Payer: Medicare Other | Admitting: Internal Medicine

## 2020-08-06 NOTE — Telephone Encounter (Signed)
rescheduled

## 2020-09-03 ENCOUNTER — Ambulatory Visit (INDEPENDENT_AMBULATORY_CARE_PROVIDER_SITE_OTHER): Payer: Medicare Other | Admitting: Internal Medicine

## 2020-09-03 ENCOUNTER — Other Ambulatory Visit: Payer: Self-pay

## 2020-09-03 ENCOUNTER — Encounter: Payer: Self-pay | Admitting: Internal Medicine

## 2020-09-03 DIAGNOSIS — K219 Gastro-esophageal reflux disease without esophagitis: Secondary | ICD-10-CM | POA: Insufficient documentation

## 2020-09-03 DIAGNOSIS — Z1211 Encounter for screening for malignant neoplasm of colon: Secondary | ICD-10-CM

## 2020-09-03 NOTE — Progress Notes (Signed)
Primary Care Physician:  Patient, No Pcp Per Primary Gastroenterologist:  Dr. Abbey Chatters  Chief Complaint  Patient presents with  . Consult    needs TCS to get on transplant team at [name]    HPI:   [name] is a 54 y.o. male who presents to the clinic today by referral from Dr. Manuella Ghazi for evaluation.  Patient states he is being worked up for a kidney transplant (his second) and needs screening colonoscopy as part of his evaluation.  Last colonoscopy many years ago.  No history of polyps.  No family history of colorectal malignancy.  No melena hematochezia.  No unintentional weight loss.  No abdominal pain.  No change in bowel habits.  Does have chronic reflux for which he takes omeprazole.  States this is well controlled.  Denies any dysphagia odynophagia.  No history of PUD or H. pylori that he knows of.  No chronic NSAID use.  Patient is currently on dialysis.  Also currently takes Eliquis daily for stroke prevention due to A. fib.  No previous stroke in the past.  Past Medical History:  Diagnosis Date  . Atrial fibrillation ([place])   . CHF (congestive heart failure) ([place])   . Chronic kidney disease   . Coronary artery disease   . HLD (hyperlipidemia)   . Hypertension   . Kidney transplanted   . Pneumonia   . Sleep apnea    CPAP    Past Surgical History:  Procedure Laterality Date  . ABLATION    . CHOLECYSTECTOMY    . HERNIA MESH REMOVAL     abdominal  . TRANSPLANTATION RENAL  2005    Current Outpatient Medications  Medication Sig Dispense Refill  . apixaban (ELIQUIS) 5 MG TABS tablet Take 5 mg by mouth 2 (two) times daily.    Marland Kitchen atorvastatin (LIPITOR) 80 MG tablet Take 80 mg by mouth daily.    . Calcium Ascorbate (BUFFERED C) 500 MG TABS Take by mouth daily.    . calcium carbonate (TUMS - DOSED IN MG ELEMENTAL CALCIUM) 500 MG chewable tablet Chew 1 tablet by mouth daily. As needed    . cholecalciferol (VITAMIN D3) 25 MCG (1000 UNIT) tablet Take 1,000 Units by  mouth daily.    . Febuxostat 80 MG TABS Take by mouth daily.    . ferric citrate (AURYXIA) 1 GM 210 MG(Fe) tablet Take 420 mg by mouth 3 (three) times daily with meals.    . fludrocortisone (FLORINEF) 0.1 MG tablet Take 0.1 mg by mouth daily.    . midodrine (PROAMATINE) 5 MG tablet Take 10 mg by mouth. As directed    . Multiple Vitamins-Minerals (MULTIVITAMIN WITH MINERALS) tablet Take 1 tablet by mouth daily.    Marland Kitchen omeprazole (PRILOSEC) 40 MG capsule Take 40 mg by mouth in the morning and at bedtime.     . predniSONE (DELTASONE) 10 MG tablet Take 10 mg by mouth daily with breakfast.    . tacrolimus (PROGRAF) 1 MG capsule Take 0.25 mg by mouth daily.     . tadalafil (CIALIS) 20 MG tablet Take 20 mg by mouth daily as needed for erectile dysfunction.    . tamsulosin (FLOMAX) 0.4 MG CAPS capsule Take 0.4 mg by mouth daily.     . calcitRIOL (ROCALTROL) 0.5 MCG capsule Take 0.5 mcg by mouth daily. (Patient not taking: Reported on 09/03/2020)    . calcium carbonate 1250 MG capsule Take 1,800 mg by mouth 2 (two) times daily with a meal. (Patient  not taking: Reported on 09/03/2020)    . cephALEXin (KEFLEX) 250 MG capsule Take 1 capsule by mouth once a week. (Patient not taking: Reported on 09/03/2020)  1  . ferrous sulfate 325 (65 FE) MG tablet Take 325 mg by mouth daily with breakfast. (Patient not taking: Reported on 09/03/2020)    . furosemide (LASIX) 80 MG tablet Take 80 mg by mouth daily as needed. (Patient not taking: Reported on 09/03/2020)  3  . magnesium oxide (MAG-OX) 400 MG tablet Take 800 mg by mouth 2 (two) times daily.  (Patient not taking: Reported on 09/03/2020)    . metoprolol (LOPRESSOR) 100 MG tablet Take 1 tablet (100 mg total) by mouth 2 (two) times daily. (Patient not taking: Reported on 09/03/2020) 60 tablet 3  . mycophenolate (CELLCEPT) 250 MG capsule Take 3 capsules (750 mg total) by mouth 2 (two) times daily. (Patient not taking: Reported on 09/03/2020) 180 capsule 3  . warfarin  (COUMADIN) 6 MG tablet Take 5 mg by mouth as directed. 6 mg Sat/Sun,  7 mg Mon-Friday  (Patient not taking: Reported on 09/03/2020)     No current facility-administered medications for this visit.    Allergies as of 09/03/2020 - Review Complete 09/03/2020  Allergen Reaction Noted  . Vancomycin Swelling 11/15/2016    Family History  Problem Relation Age of Onset  . Hypertension Mother   . Heart attack Father   . Hypertension Maternal Grandmother   . Stroke Maternal Grandfather   . Heart attack Paternal Uncle   . Heart attack Paternal Uncle   . Heart attack Paternal Aunt   . Stroke Maternal Uncle     Social History   Socioeconomic History  . Marital status: Married    Spouse name: Not on file  . Number of children: 4  . Years of education: Not on file  . Highest education level: Not on file  Occupational History  . Occupation: partial disabled  . Occupation: host  Tobacco Use  . Smoking status: Never Smoker  . Smokeless tobacco: Never Used  Substance and Sexual Activity  . Alcohol use: No  . Drug use: No  . Sexual activity: Not on file  Other Topics Concern  . Not on file  Social History Narrative  . Not on file   Social Determinants of Health   Financial Resource Strain:   . Difficulty of Paying Living Expenses: Not on file  Food Insecurity:   . Worried About Charity fundraiser in the Last Year: Not on file  . Ran Out of Food in the Last Year: Not on file  Transportation Needs:   . Lack of Transportation (Medical): Not on file  . Lack of Transportation (Non-Medical): Not on file  Physical Activity:   . Days of Exercise per Week: Not on file  . Minutes of Exercise per Session: Not on file  Stress:   . Feeling of Stress : Not on file  Social Connections:   . Frequency of Communication with Friends and Family: Not on file  . Frequency of Social Gatherings with Friends and Family: Not on file  . Attends Religious Services: Not on file  . Active Member of  Clubs or Organizations: Not on file  . Attends Archivist Meetings: Not on file  . Marital Status: Not on file  Intimate Partner Violence:   . Fear of Current or Ex-Partner: Not on file  . Emotionally Abused: Not on file  . Physically Abused: Not on file  .  Sexually Abused: Not on file    Subjective: Review of Systems  Constitutional: Negative for chills and fever.  HENT: Negative for congestion and hearing loss.   Eyes: Negative for blurred vision and double vision.  Respiratory: Negative for cough and shortness of breath.   Cardiovascular: Negative for chest pain and palpitations.  Gastrointestinal: Positive for heartburn. Negative for abdominal pain, blood in stool, constipation, diarrhea, melena and vomiting.  Genitourinary: Negative for dysuria and urgency.  Musculoskeletal: Negative for joint pain and myalgias.  Skin: Negative for itching and rash.  Neurological: Negative for dizziness and headaches.  Psychiatric/Behavioral: Negative for depression. The patient is not nervous/anxious.        Objective: BP 98/66   Pulse 66   Temp (!) 97.5 F (36.4 C)   Ht 6\' 1"  (1.854 m)   Wt 207 lb 9.6 oz (94.2 kg)   BMI 27.39 kg/m  Physical Exam Constitutional:      Appearance: Normal appearance.  HENT:     Head: Normocephalic and atraumatic.  Eyes:     Extraocular Movements: Extraocular movements intact.     Conjunctiva/sclera: Conjunctivae normal.  Cardiovascular:     Rate and Rhythm: Normal rate and regular rhythm.  Pulmonary:     Effort: Pulmonary effort is normal.     Breath sounds: Normal breath sounds.  Abdominal:     General: Bowel sounds are normal.     Palpations: Abdomen is soft.  Musculoskeletal:        General: Normal range of motion.     Cervical back: Normal range of motion and neck supple.  Skin:    General: Skin is warm.  Neurological:     General: No focal deficit present.     Mental Status: He is alert and oriented to person, place, and  time.  Psychiatric:        Mood and Affect: Mood normal.        Behavior: Behavior normal.      Assessment: *Colon cancer screening *GERD-well-controlled on omeprazole  Plan: Will schedule for screening colonoscopy.The risks including infection, bleed, or perforation as well as benefits, limitations, alternatives and imponderables have been reviewed with the patient. Questions have been answered. All parties agreeable.  Continue on omeprazole for chronic GERD which is well controlled.  If patient develops worsening symptoms, dysphagia/odynophagia, epigastric pain in the future, we may need to consider upper endoscopy.  Further recommendations to follow.  09/03/2020 3:47 PM   Disclaimer: This note was dictated with voice recognition software. Similar sounding words can inadvertently be transcribed and may not be corrected upon review.

## 2020-09-03 NOTE — Patient Instructions (Signed)
We will schedule you for screening colonoscopy today in office.  You will need to hold your Eliquis for 48 hours prior to procedure.  Further recommendations to follow.  Continue on omeprazole for your chronic reflux which is well controlled.  If you develop worsening reflux or trouble swallowing we may need to perform EGD in the future.  At [facility] we value your feedback. You may receive a survey about your visit today. Please share your experience as we strive to create trusting relationships with our patients to provide genuine, compassionate, quality care.  We appreciate your understanding and patience as we review any laboratory studies, imaging, and other diagnostic tests that are ordered as we care for you. Our office policy is 5 business days for review of these results, and any emergent or urgent results are addressed in a timely manner for your best interest. If you do not hear from our office in 1 week, please contact us.   We also encourage the use of MyChart, which contains your medical information for your review as well. If you are not enrolled in this feature, an access code is on this after visit summary for your convenience. Thank you for allowing [place] to be involved in your care.  It was great to see you today!  I hope you have a great rest of your fall!!   Caliana Spires K. Abbey Chatters, D.O. Gastroenterology and Hepatology [facility]

## 2020-09-04 ENCOUNTER — Telehealth: Payer: Self-pay

## 2020-09-04 NOTE — Telephone Encounter (Signed)
Spoke with Nurse Amy at Dr. Anne Ng office. She sent a message to Dr. Lessie Dings asking if it's ok for pt to hold Eliquis 48 hours prior to his colonoscopy. Amy will call back or fax letter of approval when approved by Dr. Lessie Dings.

## 2020-09-05 NOTE — Telephone Encounter (Signed)
Received a return call from Amy. It's ok for pt to hold Eliqus 48 hours prior to procedure per Dr. Lessie Dings.

## 2020-09-08 ENCOUNTER — Encounter: Payer: Self-pay | Admitting: *Deleted

## 2020-09-08 NOTE — Telephone Encounter (Signed)
Called pt. He has been scheduled for TC with Dr. Abbey Chatters, ASA 4 on 12/7 at 11:30am. Aware will mail prep instructions with pre-op/covid test appt. Confirmed mailing address.

## 2020-10-16 NOTE — Patient Instructions (Signed)
Ocean Schildt  10/16/2020     @PREFPERIOPPHARMACY @   Your procedure is scheduled on  10/21/2020.  Report to [facility] at  0930  A.M.  Call this number if you have problems the morning of surgery:  [phone]   Remember:  Follow the diet and prep instructions given to you by the office.                      Take these medicines the morning of surgery with A SIP OF WATER  Midodrine, prilosec, flomax. Your last dose of eliquis should be 10/18/2020.    Do not wear jewelry, make-up or nail polish.  Do not wear lotions, powders, or perfumes. Please wear deodorant and brush your teeth.  Do not shave 48 hours prior to surgery.  Men may shave face and neck.  Do not bring valuables to the hospital.  [facility] is not responsible for any belongings or valuables.  Contacts, dentures or bridgework may not be worn into surgery.  Leave your suitcase in the car.  After surgery it may be brought to your room.  For patients admitted to the hospital, discharge time will be determined by your treatment team.  Patients discharged the day of surgery will not be allowed to drive home.   Name and phone number of your driver:   family Special instructions:  DO NOT smoke the morning of your procedure.  Please read over the following fact sheets that you were given. Anesthesia Post-op Instructions and Care and Recovery After Surgery       Colonoscopy, Adult, Care After This sheet gives you information about how to care for yourself after your procedure. Your health care provider may also give you more specific instructions. If you have problems or questions, contact your health care provider. What can I expect after the procedure? After the procedure, it is common to have:  A small amount of blood in your stool for 24 hours after the procedure.  Some gas.  Mild cramping or bloating of your abdomen. Follow these instructions at home: Eating and drinking   Drink enough fluid to  keep your urine pale yellow.  Follow instructions from your health care provider about eating or drinking restrictions.  Resume your normal diet as instructed by your health care provider. Avoid heavy or fried foods that are hard to digest. Activity  Rest as told by your health care provider.  Avoid sitting for a long time without moving. Get up to take short walks every 1-2 hours. This is important to improve blood flow and breathing. Ask for help if you feel weak or unsteady.  Return to your normal activities as told by your health care provider. Ask your health care provider what activities are safe for you. Managing cramping and bloating   Try walking around when you have cramps or feel bloated.  Apply heat to your abdomen as told by your health care provider. Use the heat source that your health care provider recommends, such as a moist heat pack or a heating pad. ? Place a towel between your skin and the heat source. ? Leave the heat on for 20-30 minutes. ? Remove the heat if your skin turns bright red. This is especially important if you are unable to feel pain, heat, or cold. You may have a greater risk of getting burned. General instructions  For the first 24 hours after the procedure: ? Do not drive  or use machinery. ? Do not sign important documents. ? Do not drink alcohol. ? Do your regular daily activities at a slower pace than normal. ? Eat soft foods that are easy to digest.  Take over-the-counter and prescription medicines only as told by your health care provider.  Keep all follow-up visits as told by your health care provider. This is important. Contact a health care provider if:  You have blood in your stool 2-3 days after the procedure. Get help right away if you have:  More than a small spotting of blood in your stool.  Large blood clots in your stool.  Swelling of your abdomen.  Nausea or vomiting.  A fever.  Increasing pain in your abdomen that  is not relieved with medicine. Summary  After the procedure, it is common to have a small amount of blood in your stool. You may also have mild cramping and bloating of your abdomen.  For the first 24 hours after the procedure, do not drive or use machinery, sign important documents, or drink alcohol.  Get help right away if you have a lot of blood in your stool, nausea or vomiting, a fever, or increased pain in your abdomen. This information is not intended to replace advice given to you by your health care provider. Make sure you discuss any questions you have with your health care provider. Document Revised: 05/28/2019 Document Reviewed: 05/28/2019 Elsevier Patient Education  [place] After These instructions provide you with information about caring for yourself after your procedure. Your health care provider may also give you more specific instructions. Your treatment has been planned according to current medical practices, but problems sometimes occur. Call your health care provider if you have any problems or questions after your procedure. What can I expect after the procedure? After your procedure, you may:  Feel sleepy for several hours.  Feel clumsy and have poor balance for several hours.  Feel forgetful about what happened after the procedure.  Have poor judgment for several hours.  Feel nauseous or vomit.  Have a sore throat if you had a breathing tube during the procedure. Follow these instructions at home: For at least 24 hours after the procedure:      Have a responsible adult stay with you. It is important to have someone help care for you until you are awake and alert.  Rest as needed.  Do not: ? Participate in activities in which you could fall or become injured. ? Drive. ? Use heavy machinery. ? Drink alcohol. ? Take sleeping pills or medicines that cause drowsiness. ? Make important decisions or sign legal  documents. ? Take care of children on your own. Eating and drinking  Follow the diet that is recommended by your health care provider.  If you vomit, drink water, juice, or soup when you can drink without vomiting.  Make sure you have little or no nausea before eating solid foods. General instructions  Take over-the-counter and prescription medicines only as told by your health care provider.  If you have sleep apnea, surgery and certain medicines can increase your risk for breathing problems. Follow instructions from your health care provider about wearing your sleep device: ? Anytime you are sleeping, including during daytime naps. ? While taking prescription pain medicines, sleeping medicines, or medicines that make you drowsy.  If you smoke, do not smoke without supervision.  Keep all follow-up visits as told by your health care provider. This is  important. Contact a health care provider if:  You keep feeling nauseous or you keep vomiting.  You feel light-headed.  You develop a rash.  You have a fever. Get help right away if:  You have trouble breathing. Summary  For several hours after your procedure, you may feel sleepy and have poor judgment.  Have a responsible adult stay with you for at least 24 hours or until you are awake and alert. This information is not intended to replace advice given to you by your health care provider. Make sure you discuss any questions you have with your health care provider. Document Revised: 01/30/2018 Document Reviewed: 02/22/2016 Elsevier Patient Education  [place].

## 2020-10-17 ENCOUNTER — Other Ambulatory Visit (HOSPITAL_COMMUNITY)
Admission: RE | Admit: 2020-10-17 | Discharge: 2020-10-17 | Disposition: A | Discharge status: Home / Self Care | Payer: Medicare Other | Source: Ambulatory Visit | Attending: Internal Medicine | Admitting: Internal Medicine

## 2020-10-17 NOTE — Progress Notes (Addendum)
Patient is currently in an ED, I'm assuming near where he lives. Pt said he is going to cancel his procedure and covid test. Called Dr. Ave Filter office to let them know patient is going to call and cancel his procedure and covid test. (I'm not sure when or if he will be able to call.)  Called Endo to make them aware.

## 2020-10-20 ENCOUNTER — Other Ambulatory Visit (HOSPITAL_COMMUNITY): Payer: Medicare Other

## 2020-10-20 ENCOUNTER — Telehealth: Payer: Self-pay | Admitting: Internal Medicine

## 2020-10-20 ENCOUNTER — Encounter (HOSPITAL_COMMUNITY): Payer: Self-pay

## 2020-10-20 ENCOUNTER — Encounter (HOSPITAL_COMMUNITY)
Admission: RE | Admit: 2020-10-20 | Discharge: 2020-10-20 | Disposition: A | Discharge status: Home / Self Care | Payer: Medicare Other | Source: Ambulatory Visit | Attending: Internal Medicine | Admitting: Internal Medicine

## 2020-10-20 NOTE — Telephone Encounter (Signed)
Pt needs to reschedule his procedure. [phone]

## 2020-10-20 NOTE — Telephone Encounter (Signed)
Called pt, TCS w/Dr. Abbey Chatters rescheduled to 12/09/20 at 10:15am. Orders re-entered.

## 2020-10-20 NOTE — Telephone Encounter (Signed)
Pre-op phone call 12/04/20, covid test 12/05/20. Letters mailed with new procedure instructions.

## 2020-10-21 ENCOUNTER — Encounter (HOSPITAL_COMMUNITY): Admission: RE | Payer: Self-pay | Source: Home / Self Care

## 2020-10-21 ENCOUNTER — Ambulatory Visit (HOSPITAL_COMMUNITY): Admission: RE | Admit: 2020-10-21 | Payer: Medicare Other | Source: Home / Self Care

## 2020-10-21 SURGERY — COLONOSCOPY WITH PROPOFOL
Anesthesia: Monitor Anesthesia Care

## 2020-12-04 ENCOUNTER — Other Ambulatory Visit: Payer: Self-pay

## 2020-12-04 ENCOUNTER — Encounter (HOSPITAL_COMMUNITY)
Admission: RE | Admit: 2020-12-04 | Discharge: 2020-12-04 | Disposition: A | Discharge status: Home / Self Care | Payer: Medicare Other | Source: Ambulatory Visit | Attending: Internal Medicine | Admitting: Internal Medicine

## 2020-12-05 ENCOUNTER — Other Ambulatory Visit (HOSPITAL_COMMUNITY): Payer: Medicare Other

## 2020-12-05 ENCOUNTER — Other Ambulatory Visit (HOSPITAL_COMMUNITY)
Admission: RE | Admit: 2020-12-05 | Discharge: 2020-12-05 | Disposition: A | Discharge status: Home / Self Care | Payer: Medicare Other | Source: Ambulatory Visit | Attending: Internal Medicine | Admitting: Internal Medicine

## 2020-12-05 ENCOUNTER — Other Ambulatory Visit: Payer: Self-pay

## 2020-12-05 DIAGNOSIS — Z01812 Encounter for preprocedural laboratory examination: Secondary | ICD-10-CM | POA: Diagnosis present

## 2020-12-05 DIAGNOSIS — Z20822 Contact with and (suspected) exposure to covid-19: Secondary | ICD-10-CM | POA: Insufficient documentation

## 2020-12-05 LAB — SARS CORONAVIRUS 2 (TAT 6-24 HRS): SARS Coronavirus 2: NEGATIVE

## 2020-12-09 ENCOUNTER — Ambulatory Visit (HOSPITAL_COMMUNITY): Payer: Medicare Other | Admitting: Certified Registered"

## 2020-12-09 ENCOUNTER — Encounter (HOSPITAL_COMMUNITY)
Admission: RE | Disposition: A | Discharge status: Home / Self Care | Payer: Self-pay | Source: Home / Self Care | Attending: Internal Medicine

## 2020-12-09 ENCOUNTER — Ambulatory Visit (HOSPITAL_COMMUNITY)
Admission: RE | Admit: 2020-12-09 | Discharge: 2020-12-09 | Disposition: A | Discharge status: Home / Self Care | Payer: Medicare Other | Attending: Internal Medicine | Admitting: Internal Medicine

## 2020-12-09 ENCOUNTER — Encounter (HOSPITAL_COMMUNITY): Payer: Self-pay

## 2020-12-09 ENCOUNTER — Other Ambulatory Visit: Payer: Self-pay

## 2020-12-09 DIAGNOSIS — N189 Chronic kidney disease, unspecified: Secondary | ICD-10-CM | POA: Insufficient documentation

## 2020-12-09 DIAGNOSIS — Z885 Allergy status to narcotic agent status: Secondary | ICD-10-CM | POA: Diagnosis not present

## 2020-12-09 DIAGNOSIS — I130 Hypertensive heart and chronic kidney disease with heart failure and stage 1 through stage 4 chronic kidney disease, or unspecified chronic kidney disease: Secondary | ICD-10-CM | POA: Insufficient documentation

## 2020-12-09 DIAGNOSIS — Z823 Family history of stroke: Secondary | ICD-10-CM | POA: Insufficient documentation

## 2020-12-09 DIAGNOSIS — I4891 Unspecified atrial fibrillation: Secondary | ICD-10-CM | POA: Diagnosis not present

## 2020-12-09 DIAGNOSIS — Z8249 Family history of ischemic heart disease and other diseases of the circulatory system: Secondary | ICD-10-CM | POA: Insufficient documentation

## 2020-12-09 DIAGNOSIS — Z881 Allergy status to other antibiotic agents status: Secondary | ICD-10-CM | POA: Diagnosis not present

## 2020-12-09 DIAGNOSIS — Z7682 Awaiting organ transplant status: Secondary | ICD-10-CM | POA: Diagnosis not present

## 2020-12-09 DIAGNOSIS — Z1211 Encounter for screening for malignant neoplasm of colon: Secondary | ICD-10-CM | POA: Insufficient documentation

## 2020-12-09 DIAGNOSIS — I2510 Atherosclerotic heart disease of native coronary artery without angina pectoris: Secondary | ICD-10-CM | POA: Diagnosis not present

## 2020-12-09 DIAGNOSIS — Z79899 Other long term (current) drug therapy: Secondary | ICD-10-CM | POA: Diagnosis not present

## 2020-12-09 DIAGNOSIS — Z940 Kidney transplant status: Secondary | ICD-10-CM | POA: Insufficient documentation

## 2020-12-09 DIAGNOSIS — K648 Other hemorrhoids: Secondary | ICD-10-CM | POA: Diagnosis not present

## 2020-12-09 DIAGNOSIS — K5730 Diverticulosis of large intestine without perforation or abscess without bleeding: Secondary | ICD-10-CM | POA: Insufficient documentation

## 2020-12-09 DIAGNOSIS — I509 Heart failure, unspecified: Secondary | ICD-10-CM | POA: Diagnosis not present

## 2020-12-09 HISTORY — DX: Cardiac arrhythmia, unspecified: I49.9

## 2020-12-09 HISTORY — PX: COLONOSCOPY WITH PROPOFOL: SHX5780

## 2020-12-09 LAB — POCT I-STAT, CHEM 8
BUN: 43 mg/dL — ABNORMAL HIGH (ref 6–20)
Calcium, Ion: 1.06 mmol/L — ABNORMAL LOW (ref 1.15–1.40)
Chloride: 96 mmol/L — ABNORMAL LOW (ref 98–111)
Creatinine, Ser: 10 mg/dL — ABNORMAL HIGH (ref 0.61–1.24)
Glucose, Bld: 82 mg/dL (ref 70–99)
HCT: 46 % (ref 39.0–52.0)
Hemoglobin: 15.6 g/dL (ref 13.0–17.0)
Potassium: 3.9 mmol/L (ref 3.5–5.1)
Sodium: 135 mmol/L (ref 135–145)
TCO2: 28 mmol/L (ref 22–32)

## 2020-12-09 SURGERY — COLONOSCOPY WITH PROPOFOL
Anesthesia: General

## 2020-12-09 MED ORDER — PROPOFOL 10 MG/ML IV BOLUS
INTRAVENOUS | Status: DC | PRN
  Administered 2020-12-09: 100 mg via INTRAVENOUS
  Administered 2020-12-09 (×2): 30 mg via INTRAVENOUS
  Administered 2020-12-09: 40 mg via INTRAVENOUS

## 2020-12-09 MED ORDER — SODIUM CHLORIDE 0.9 % IV SOLN: Freq: Once | INTRAVENOUS | Status: AC

## 2020-12-09 MED ORDER — STERILE WATER FOR IRRIGATION IR SOLN
Status: DC | PRN
  Administered 2020-12-09: 100 mL

## 2020-12-09 MED ORDER — LACTATED RINGERS IV SOLN: INTRAVENOUS | Status: DC

## 2020-12-09 MED ORDER — LIDOCAINE HCL (CARDIAC) PF 100 MG/5ML IV SOSY
PREFILLED_SYRINGE | INTRAVENOUS | Status: DC | PRN
  Administered 2020-12-09: 50 mg via INTRAVENOUS

## 2020-12-09 MED ORDER — PHENYLEPHRINE 40 MCG/ML (10ML) SYRINGE FOR IV PUSH (FOR BLOOD PRESSURE SUPPORT)
PREFILLED_SYRINGE | INTRAVENOUS | Status: DC | PRN
  Administered 2020-12-09: 80 ug via INTRAVENOUS
  Administered 2020-12-09: 120 ug via INTRAVENOUS
  Administered 2020-12-09: 80 ug via INTRAVENOUS
  Administered 2020-12-09: 120 ug via INTRAVENOUS

## 2020-12-09 NOTE — Op Note (Signed)
[facility] Patient Name: [name] Procedure Date: 12/09/2020 10:43 AM MRN: [id number] Date of Birth: August 10, 1966 Attending MD: Elon Alas. Abbey Chatters DO CSN: [id number] Age: 55 Admit Type: Outpatient Procedure:                Colonoscopy Indications:              Screening for colorectal malignant neoplasm Providers:                Elon Alas. Abbey Chatters, DO, Lambert Mody, Dereck Leep, Technician Referring MD:              Medicines:                See the Anesthesia note for documentation of the                            administered medications Complications:            No immediate complications. Estimated Blood Loss:     Estimated blood loss: none. Procedure:                Pre-Anesthesia Assessment:                           - The anesthesia plan was to use monitored                            anesthesia care (MAC).                           After obtaining informed consent, the colonoscope                            was passed under direct vision. Throughout the                            procedure, the patient's blood pressure, pulse, and                            oxygen saturations were monitored continuously. The                            [id number]-HQ190L ([id number]) scope was introduced through                            the anus and advanced to the the cecum, identified                            by appendiceal orifice and ileocecal valve. The                            colonoscopy was performed without difficulty. The                            patient tolerated the procedure well. The  quality                            of the bowel preparation was evaluated using the                            BBPS [facility] Bowel Preparation Scale) with scores                            of: Right Colon = 2 (minor amount of residual                            staining, small fragments of stool and/or opaque                            liquid, but  mucosa seen well), Transverse Colon = 2                            (minor amount of residual staining, small fragments                            of stool and/or opaque liquid, but mucosa seen                            well) and Left Colon = 2 (minor amount of residual                            staining, small fragments of stool and/or opaque                            liquid, but mucosa seen well). The total BBPS score                            equals 6. The quality of the bowel preparation was                            fair. Scope In: 10:52:56 AM Scope Out: 11:04:31 AM Scope Withdrawal Time: 0 hours 9 minutes 8 seconds  Total Procedure Duration: 0 hours 11 minutes 35 seconds  Findings:      The perianal and digital rectal examinations were normal.      Non-bleeding internal hemorrhoids were found during endoscopy.      Many small and large-mouthed diverticula were found in the sigmoid colon       and descending colon.      A moderate amount of stool was found in the entire colon, making       visualization difficult. Lavage of the area was performed using a large       amount of sterile water, resulting in clearance with fair visualization. Impression:               - Preparation of the colon was fair.                           - Non-bleeding internal hemorrhoids.                           -  Diverticulosis in the sigmoid colon and in the                            descending colon.                           - Stool in the entire examined colon.                           - No specimens collected. Moderate Sedation:      Per Anesthesia Care Recommendation:           - Patient has a contact number available for                            emergencies. The signs and symptoms of potential                            delayed complications were discussed with the                            patient. Return to normal activities tomorrow.                            Written discharge  instructions were provided to the                            patient.                           - Resume previous diet.                           - Continue present medications.                           - Return to GI clinic PRN.                           - Repeat colonoscopy in 10 years for screening                            purposes. Procedure Code(s):        --- Professional ---                           S0109, Colorectal cancer screening; colonoscopy on                            individual not meeting criteria for high risk Diagnosis Code(s):        --- Professional ---                           Z12.11, Encounter for screening for malignant                            neoplasm of colon  K64.8, Other hemorrhoids                           K57.30, Diverticulosis of large intestine without                            perforation or abscess without bleeding CPT copyright 2019 American Medical Association. All rights reserved. The codes documented in this report are preliminary and upon coder review may  be revised to meet current compliance requirements. Elon Alas. Abbey Chatters, DO [place] Abbey Chatters, DO 12/09/2020 11:07:39 AM This report has been signed electronically. Number of Addenda: 0

## 2020-12-09 NOTE — Transfer of Care (Signed)
Immediate Anesthesia Transfer of Care Note  Patient: [name]  Procedure(s) Performed: COLONOSCOPY WITH PROPOFOL (N/A )  Patient Location: PACU  Anesthesia Type:General  Level of Consciousness: awake, alert  and oriented  Airway & Oxygen Therapy: Patient Spontanous Breathing and Patient connected to nasal cannula oxygen  Post-op Assessment: Report given to RN and Post -op Vital signs reviewed and stable  Post vital signs: Reviewed and stable  Last Vitals:  Vitals Value Taken Time  BP 69/38 12/09/20 1111  Temp    Pulse 69 12/09/20 1114  Resp 16 12/09/20 1114  SpO2 100 % 12/09/20 1114  Vitals shown include unvalidated device data.  Last Pain:  Vitals:   12/09/20 1049  TempSrc:   PainSc: 0-No pain      Patients Stated Pain Goal: 4 ([id number])  Complications: No complications documented.

## 2020-12-09 NOTE — Anesthesia Procedure Notes (Signed)
Date/Time: 12/09/2020 10:58 AM Performed by: Orlie Dakin, CRNA Pre-anesthesia Checklist: Patient identified, Emergency Drugs available, Suction available and Patient being monitored Patient Re-evaluated:Patient Re-evaluated prior to induction Oxygen Delivery Method: Nasal cannula Induction Type: IV induction Placement Confirmation: positive ETCO2

## 2020-12-09 NOTE — Discharge Instructions (Addendum)
Colonoscopy Discharge Instructions  Read the instructions outlined below and refer to this sheet in the next few weeks. These discharge instructions provide you with general information on caring for yourself after you leave the hospital. Your doctor may also give you specific instructions. While your treatment has been planned according to the most current medical practices available, unavoidable complications occasionally occur.   ACTIVITY  You may resume your regular activity, but move at a slower pace for the next 24 hours.   Take frequent rest periods for the next 24 hours.   Walking will help get rid of the air and reduce the bloated feeling in your belly (abdomen).   No driving for 24 hours (because of the medicine (anesthesia) used during the test).    Do not sign any important legal documents or operate any machinery for 24 hours (because of the anesthesia used during the test).  NUTRITION  Drink plenty of fluids.   You may resume your normal diet as instructed by your doctor.   Begin with a light meal and progress to your normal diet. Heavy or fried foods are harder to digest and may make you feel sick to your stomach (nauseated).   Avoid alcoholic beverages for 24 hours or as instructed.  MEDICATIONS  You may resume your normal medications unless your doctor tells you otherwise.  WHAT YOU CAN EXPECT TODAY  Some feelings of bloating in the abdomen.   Passage of more gas than usual.   Spotting of blood in your stool or on the toilet paper.  IF YOU HAD POLYPS REMOVED DURING THE COLONOSCOPY:  No aspirin products for 7 days or as instructed.   No alcohol for 7 days or as instructed.   Eat a soft diet for the next 24 hours.  FINDING OUT THE RESULTS OF YOUR TEST Not all test results are available during your visit. If your test results are not back during the visit, make an appointment with your caregiver to find out the results. Do not assume everything is normal if  you have not heard from your caregiver or the medical facility. It is important for you to follow up on all of your test results.  SEEK IMMEDIATE MEDICAL ATTENTION IF:  You have more than a spotting of blood in your stool.   Your belly is swollen (abdominal distention).   You are nauseated or vomiting.   You have a temperature over 101.   You have abdominal pain or discomfort that is severe or gets worse throughout the day.   Your colonoscopy was relatively unremarkable. I did not find any evidence of colon cancer or polyps. Repeat colonoscopy in 10 years. You do have diverticulosis and internal hemorrhoids. I would recommend increasing fiber in your diet or adding OTC Benefiber/Metamucil. Be sure to drink at least 4 to 6 glasses of water daily. Follow-up with GI as needed.   I hope you have a great rest of your week!  Elon Alas. Abbey Chatters, D.O. Gastroenterology and Hepatology [facility]   Diverticulosis  Diverticulosis is a condition that develops when small pouches (diverticula) form in the wall of the large intestine (colon). The colon is where water is absorbed and stool (feces) is formed. The pouches form when the inside layer of the colon pushes through weak spots in the outer layers of the colon. You may have a few pouches or many of them. The pouches usually do not cause problems unless they become inflamed or infected. When this happens,  the condition is called diverticulitis. What are the causes? The cause of this condition is not known. What increases the risk? The following factors may make you more likely to develop this condition:  Being older than age 62. Your risk for this condition increases with age. Diverticulosis is rare among people younger than age 30. By age 41, many people have it.  Eating a low-fiber diet.  Having frequent constipation.  Being overweight.  Not getting enough exercise.  Smoking.  Taking over-the-counter pain  medicines, like aspirin and ibuprofen.  Having a family history of diverticulosis. What are the signs or symptoms? In most people, there are no symptoms of this condition. If you do have symptoms, they may include:  Bloating.  Cramps in the abdomen.  Constipation or diarrhea.  Pain in the lower left side of the abdomen. How is this diagnosed? Because diverticulosis usually has no symptoms, it is most often diagnosed during an exam for other colon problems. The condition may be diagnosed by:  Using a flexible scope to examine the colon (colonoscopy).  Taking an X-ray of the colon after dye has been put into the colon (barium enema).  Having a CT scan. How is this treated? You may not need treatment for this condition. Your health care provider may recommend treatment to prevent problems. You may need treatment if you have symptoms or if you previously had diverticulitis. Treatment may include:  Eating a high-fiber diet.  Taking a fiber supplement.  Taking a live bacteria supplement (probiotic).  Taking medicine to relax your colon.   Follow these instructions at home: Medicines  Take over-the-counter and prescription medicines only as told by your health care provider.  If told by your health care provider, take a fiber supplement or probiotic. Constipation prevention Your condition may cause constipation. To prevent or treat constipation, you may need to:  Drink enough fluid to keep your urine pale yellow.  Take over-the-counter or prescription medicines.  Eat foods that are high in fiber, such as beans, whole grains, and fresh fruits and vegetables.  Limit foods that are high in fat and processed sugars, such as fried or sweet foods.   General instructions  Try not to strain when you have a bowel movement.  Keep all follow-up visits as told by your health care provider. This is important. Contact a health care provider if you:  Have pain in your abdomen.  Have  bloating.  Have cramps.  Have not had a bowel movement in 3 days. Get help right away if:  Your pain gets worse.  Your bloating becomes very bad.  You have a fever or chills, and your symptoms suddenly get worse.  You vomit.  You have bowel movements that are bloody or black.  You have bleeding from your rectum. Summary  Diverticulosis is a condition that develops when small pouches (diverticula) form in the wall of the large intestine (colon).  You may have a few pouches or many of them.  This condition is most often diagnosed during an exam for other colon problems.  Treatment may include increasing the fiber in your diet, taking supplements, or taking medicines. This information is not intended to replace advice given to you by your health care provider. Make sure you discuss any questions you have with your health care provider. Document Revised: 05/31/2019 Document Reviewed: 05/31/2019 Elsevier Patient Education  2021 [place].  Hemorrhoids Hemorrhoids are swollen veins in and around the rectum or anus. There are two types of  hemorrhoids:  Internal hemorrhoids. These occur in the veins that are just inside the rectum. They may poke through to the outside and become irritated and painful.  External hemorrhoids. These occur in the veins that are outside the anus and can be felt as a painful swelling or hard lump near the anus. Most hemorrhoids do not cause serious problems, and they can be managed with home treatments such as diet and lifestyle changes. If home treatments do not help the symptoms, procedures can be done to shrink or remove the hemorrhoids. What are the causes? This condition is caused by increased pressure in the anal area. This pressure may result from various things, including:  Constipation.  Straining to have a bowel movement.  Diarrhea.  Pregnancy.  Obesity.  Sitting for long periods of time.  Heavy lifting or other activity that  causes you to strain.  Anal sex.  Riding a bike for a long period of time. What are the signs or symptoms? Symptoms of this condition include:  Pain.  Anal itching or irritation.  Rectal bleeding.  Leakage of stool (feces).  Anal swelling.  One or more lumps around the anus. How is this diagnosed? This condition can often be diagnosed through a visual exam. Other exams or tests may also be done, such as:  An exam that involves feeling the rectal area with a gloved hand (digital rectal exam).  An exam of the anal canal that is done using a small tube (anoscope).  A blood test, if you have lost a significant amount of blood.  A test to look inside the colon using a flexible tube with a camera on the end (sigmoidoscopy or colonoscopy). How is this treated? This condition can usually be treated at home. However, various procedures may be done if dietary changes, lifestyle changes, and other home treatments do not help your symptoms. These procedures can help make the hemorrhoids smaller or remove them completely. Some of these procedures involve surgery, and others do not. Common procedures include:  Rubber band ligation. Rubber bands are placed at the base of the hemorrhoids to cut off their blood supply.  Sclerotherapy. Medicine is injected into the hemorrhoids to shrink them.  Infrared coagulation. A type of light energy is used to get rid of the hemorrhoids.  Hemorrhoidectomy surgery. The hemorrhoids are surgically removed, and the veins that supply them are tied off.  Stapled hemorrhoidopexy surgery. The surgeon staples the base of the hemorrhoid to the rectal wall. Follow these instructions at home: Eating and drinking  Eat foods that have a lot of fiber in them, such as whole grains, beans, nuts, fruits, and vegetables.  Ask your health care provider about taking products that have added fiber (fiber supplements).  Reduce the amount of fat in your diet. You can do  this by eating low-fat dairy products, eating less red meat, and avoiding processed foods.  Drink enough fluid to keep your urine pale yellow.   Managing pain and swelling  Take warm sitz baths for 20 minutes, 3-4 times a day to ease pain and discomfort. You may do this in a bathtub or using a portable sitz bath that fits over the toilet.  If directed, apply ice to the affected area. Using ice packs between sitz baths may be helpful. ? Put ice in a plastic bag. ? Place a towel between your skin and the bag. ? Leave the ice on for 20 minutes, 2-3 times a day.   General instructions  Take  over-the-counter and prescription medicines only as told by your health care provider.  Use medicated creams or suppositories as told.  Get regular exercise. Ask your health care provider how much and what kind of exercise is best for you. In general, you should do moderate exercise for at least 30 minutes on most days of the week (150 minutes each week). This can include activities such as walking, biking, or yoga.  Go to the bathroom when you have the urge to have a bowel movement. Do not wait.  Avoid straining to have bowel movements.  Keep the anal area dry and clean. Use wet toilet paper or moist towelettes after a bowel movement.  Do not sit on the toilet for long periods of time. This increases blood pooling and pain.  Keep all follow-up visits as told by your health care provider. This is important. Contact a health care provider if you have:  Increasing pain and swelling that are not controlled by treatment or medicine.  Difficulty having a bowel movement, or you are unable to have a bowel movement.  Pain or inflammation outside the area of the hemorrhoids. Get help right away if you have:  Uncontrolled bleeding from your rectum. Summary  Hemorrhoids are swollen veins in and around the rectum or anus.  Most hemorrhoids can be managed with home treatments such as diet and lifestyle  changes.  Taking warm sitz baths can help ease pain and discomfort.  In severe cases, procedures or surgery can be done to shrink or remove the hemorrhoids. This information is not intended to replace advice given to you by your health care provider. Make sure you discuss any questions you have with your health care provider. Document Revised: 03/30/2019 Document Reviewed: 03/23/2018 Elsevier Patient Education  [place].

## 2020-12-09 NOTE — H&P (Signed)
Primary Care Physician:  Gardiner Rhyme, MD Primary Gastroenterologist:  Dr. Abbey Chatters  Pre-Procedure History & Physical: HPI:  [name] is a 55 y.o. male is here for a colonoscopy for colon cancer screening purposes. Patient states he is being worked up for a kidney transplant (his second) and needs screening colonoscopy as part of his evaluation.  Last colonoscopy many years ago.  No history of polyps.  No family history of colorectal malignancy.  No melena hematochezia.  No unintentional weight loss.  No abdominal pain.  No change in bowel habits. Patient denies any family history of colorectal cancer.  No melena or hematochezia.  No abdominal pain or unintentional weight loss.  No change in bowel habits.  Overall feels well from a GI standpoint.  Past Medical History:  Diagnosis Date  . Atrial fibrillation ([place])   . CHF (congestive heart failure) ([place])   . Chronic kidney disease   . Coronary artery disease   . Dysrhythmia    A-fib  . HLD (hyperlipidemia)   . Hypertension   . Kidney transplanted   . Pneumonia   . Sleep apnea    CPAP    Past Surgical History:  Procedure Laterality Date  . ABLATION    . CHOLECYSTECTOMY    . HERNIA MESH REMOVAL     abdominal  . TRANSPLANTATION RENAL  2005    Prior to Admission medications   Medication Sig Start Date End Date Taking? Authorizing Provider  acetaminophen (TYLENOL) 500 MG tablet Take 1,000 mg by mouth every 6 (six) hours as needed for moderate pain or headache.   Yes [provider]  amiodarone (PACERONE) 200 MG tablet Take 200 mg by mouth daily.   Yes [provider]  apixaban (ELIQUIS) 5 MG TABS tablet Take 5 mg by mouth 2 (two) times daily.   Yes [provider]  Ascorbic Acid (VITAMIN C PO) Take 1 tablet by mouth daily.   Yes [provider]  atorvastatin (LIPITOR) 80 MG tablet Take 80 mg by mouth daily.   Yes [provider]  calcium elemental as carbonate (TUMS ULTRA 1000) 400 MG  chewable tablet Chew 2,000 mg by mouth 2 (two) times daily.   Yes [provider]  febuxostat (ULORIC) 40 MG tablet Take 40 mg by mouth daily.   Yes [provider]  ferric citrate (AURYXIA) 1 GM 210 MG(Fe) tablet Take 210 mg by mouth 3 (three) times daily with meals.    Yes [provider]  Multiple Vitamins-Minerals (MULTIVITAMIN WITH MINERALS) tablet Take 1 tablet by mouth daily.   Yes [provider]  omeprazole (PRILOSEC) 40 MG capsule Take 40 mg by mouth 2 (two) times daily.    Yes [provider]  predniSONE (DELTASONE) 10 MG tablet Take 10 mg by mouth daily with breakfast.   Yes [provider]  tacrolimus (PROGRAF) 0.5 MG capsule Take 0.5 mg by mouth daily.   Yes [provider]  tadalafil (CIALIS) 20 MG tablet Take 40 mg by mouth daily.   Yes [provider]  tamsulosin (FLOMAX) 0.4 MG CAPS capsule Take 0.4 mg by mouth daily.    Yes [provider]  VITAMIN D PO Take 1 tablet by mouth daily.   Yes [provider]  Wound Dressings (HYDROFERA BLUE FOAM DRESSING EX) Apply 1 patch topically every other day.   Yes [provider]    Allergies as of 10/20/2020 - Review Complete 10/16/2020  Allergen Reaction Noted  . Codeine  10/16/2020  . Vancomycin Swelling 11/15/2016    Family History  Problem Relation Age of Onset  . Hypertension Mother   . Heart attack Father   . Hypertension Maternal Grandmother   . Stroke Maternal Grandfather   . Heart attack Paternal Uncle   . Heart attack Paternal Uncle   . Heart attack Paternal Aunt   . Stroke Maternal Uncle     Social History   Socioeconomic History  . Marital status: Married    Spouse name: Not on file  . Number of children: 4  . Years of education: Not on file  . Highest education level: Not on file  Occupational History  . Occupation: partial disabled  . Occupation: host  Tobacco Use  . Smoking status: Never Smoker  .  Smokeless tobacco: Never Used  Substance and Sexual Activity  . Alcohol use: No  . Drug use: No  . Sexual activity: Not on file  Other Topics Concern  . Not on file  Social History Narrative  . Not on file   Social Determinants of Health   Financial Resource Strain: Not on file  Food Insecurity: Not on file  Transportation Needs: Not on file  Physical Activity: Not on file  Stress: Not on file  Social Connections: Not on file  Intimate Partner Violence: Not on file    Review of Systems: See HPI, otherwise negative ROS  Impression/Plan: [name] is here for a colonoscopy to be performed for colon cancer screening purposes.  The risks of the procedure including infection, bleed, or perforation as well as benefits, limitations, alternatives and imponderables have been reviewed with the patient. Questions have been answered. All parties agreeable.

## 2020-12-09 NOTE — Anesthesia Preprocedure Evaluation (Signed)
Anesthesia Evaluation  Patient identified by MRN, date of birth, ID band Patient awake    Reviewed: Allergy & Precautions, H&P , NPO status , Patient's Chart, lab work & pertinent test results, reviewed documented beta blocker date and time   Airway Mallampati: II  TM Distance: >3 FB Neck ROM: full    Dental no notable dental hx.    Pulmonary sleep apnea , pneumonia, resolved,    Pulmonary exam normal breath sounds clear to auscultation       Cardiovascular Exercise Tolerance: Good hypertension, + CAD and +CHF  + dysrhythmias  Rhythm:regular Rate:Normal     Neuro/Psych negative neurological ROS  negative psych ROS   GI/Hepatic Neg liver ROS, GERD  Medicated,  Endo/Other  Morbid obesity  Renal/GU CRF and ESRFRenal disease  negative genitourinary   Musculoskeletal   Abdominal   Peds  Hematology  (+) Blood dyscrasia, anemia ,   Anesthesia Other Findings   Reproductive/Obstetrics negative OB ROS                             Anesthesia Physical Anesthesia Plan  ASA: III  Anesthesia Plan: General   Post-op Pain Management:    Induction:   PONV Risk Score and Plan: TIVA and Propofol infusion  Airway Management Planned:   Additional Equipment:   Intra-op Plan:   Post-operative Plan:   Informed Consent: I have reviewed the patients History and Physical, chart, labs and discussed the procedure including the risks, benefits and alternatives for the proposed anesthesia with the patient or authorized representative who has indicated his/her understanding and acceptance.     Dental Advisory Given  Plan Discussed with: CRNA  Anesthesia Plan Comments:         Anesthesia Quick Evaluation

## 2020-12-09 NOTE — Anesthesia Postprocedure Evaluation (Signed)
Anesthesia Post Note  Patient: Kvion Lell  Procedure(s) Performed: COLONOSCOPY WITH PROPOFOL (N/A )  Patient location during evaluation: PACU Anesthesia Type: General Level of consciousness: awake and alert and oriented Pain management: pain level controlled Vital Signs Assessment: post-procedure vital signs reviewed and stable Respiratory status: spontaneous breathing, nonlabored ventilation and respiratory function stable Cardiovascular status: blood pressure returned to baseline and stable Postop Assessment: no apparent nausea or vomiting Anesthetic complications: no   No complications documented.   Last Vitals:  Vitals:   12/09/20 1115 12/09/20 1135  BP: (!) 89/73 92/61  Pulse: 70 79  Resp: 18 (!) 22  Temp: 37 C   SpO2: 100% 97%    Last Pain:  Vitals:   12/09/20 1135  TempSrc:   PainSc: 0-No pain                 Orlie Dakin

## 2020-12-11 ENCOUNTER — Encounter (HOSPITAL_COMMUNITY): Payer: Self-pay | Admitting: Internal Medicine

## 2021-03-15 DIAGNOSIS — K922 Gastrointestinal hemorrhage, unspecified: Secondary | ICD-10-CM

## 2021-03-15 HISTORY — DX: Gastrointestinal hemorrhage, unspecified: K92.2

## 2021-03-26 DIAGNOSIS — Z955 Presence of coronary angioplasty implant and graft: Secondary | ICD-10-CM

## 2021-03-26 HISTORY — DX: Presence of coronary angioplasty implant and graft: Z95.5

## 2021-03-30 ENCOUNTER — Other Ambulatory Visit: Payer: Self-pay

## 2021-03-30 ENCOUNTER — Emergency Department (HOSPITAL_COMMUNITY): Payer: Medicare Other

## 2021-03-30 ENCOUNTER — Inpatient Hospital Stay (HOSPITAL_COMMUNITY)
Admission: EM | Admit: 2021-03-30 | Discharge: 2021-04-07 | DRG: 377 | Disposition: A | Discharge status: Home / Self Care | Payer: Medicare Other | Attending: Internal Medicine | Admitting: Internal Medicine

## 2021-03-30 ENCOUNTER — Inpatient Hospital Stay (HOSPITAL_COMMUNITY): Payer: Medicare Other | Admitting: Anesthesiology

## 2021-03-30 ENCOUNTER — Inpatient Hospital Stay (HOSPITAL_COMMUNITY): Payer: Medicare Other

## 2021-03-30 ENCOUNTER — Encounter (HOSPITAL_COMMUNITY): Payer: Self-pay

## 2021-03-30 DIAGNOSIS — D649 Anemia, unspecified: Secondary | ICD-10-CM

## 2021-03-30 DIAGNOSIS — Z6829 Body mass index (BMI) 29.0-29.9, adult: Secondary | ICD-10-CM | POA: Diagnosis not present

## 2021-03-30 DIAGNOSIS — I5042 Chronic combined systolic (congestive) and diastolic (congestive) heart failure: Secondary | ICD-10-CM | POA: Diagnosis present

## 2021-03-30 DIAGNOSIS — Z955 Presence of coronary angioplasty implant and graft: Secondary | ICD-10-CM

## 2021-03-30 DIAGNOSIS — K31811 Angiodysplasia of stomach and duodenum with bleeding: Principal | ICD-10-CM

## 2021-03-30 DIAGNOSIS — E669 Obesity, unspecified: Secondary | ICD-10-CM | POA: Diagnosis present

## 2021-03-30 DIAGNOSIS — Z8249 Family history of ischemic heart disease and other diseases of the circulatory system: Secondary | ICD-10-CM

## 2021-03-30 DIAGNOSIS — E785 Hyperlipidemia, unspecified: Secondary | ICD-10-CM | POA: Diagnosis present

## 2021-03-30 DIAGNOSIS — R578 Other shock: Secondary | ICD-10-CM | POA: Diagnosis present

## 2021-03-30 DIAGNOSIS — R569 Unspecified convulsions: Secondary | ICD-10-CM | POA: Diagnosis present

## 2021-03-30 DIAGNOSIS — Z885 Allergy status to narcotic agent status: Secondary | ICD-10-CM

## 2021-03-30 DIAGNOSIS — E871 Hypo-osmolality and hyponatremia: Secondary | ICD-10-CM | POA: Diagnosis not present

## 2021-03-30 DIAGNOSIS — Z79899 Other long term (current) drug therapy: Secondary | ICD-10-CM

## 2021-03-30 DIAGNOSIS — Z20822 Contact with and (suspected) exposure to covid-19: Secondary | ICD-10-CM | POA: Diagnosis present

## 2021-03-30 DIAGNOSIS — D631 Anemia in chronic kidney disease: Secondary | ICD-10-CM | POA: Diagnosis present

## 2021-03-30 DIAGNOSIS — I132 Hypertensive heart and chronic kidney disease with heart failure and with stage 5 chronic kidney disease, or end stage renal disease: Secondary | ICD-10-CM | POA: Diagnosis present

## 2021-03-30 DIAGNOSIS — E861 Hypovolemia: Secondary | ICD-10-CM | POA: Diagnosis present

## 2021-03-30 DIAGNOSIS — J988 Other specified respiratory disorders: Secondary | ICD-10-CM | POA: Diagnosis not present

## 2021-03-30 DIAGNOSIS — N186 End stage renal disease: Secondary | ICD-10-CM | POA: Diagnosis present

## 2021-03-30 DIAGNOSIS — D500 Iron deficiency anemia secondary to blood loss (chronic): Secondary | ICD-10-CM | POA: Diagnosis not present

## 2021-03-30 DIAGNOSIS — J9601 Acute respiratory failure with hypoxia: Secondary | ICD-10-CM | POA: Diagnosis not present

## 2021-03-30 DIAGNOSIS — Z823 Family history of stroke: Secondary | ICD-10-CM

## 2021-03-30 DIAGNOSIS — I9589 Other hypotension: Secondary | ICD-10-CM | POA: Diagnosis not present

## 2021-03-30 DIAGNOSIS — Z992 Dependence on renal dialysis: Secondary | ICD-10-CM

## 2021-03-30 DIAGNOSIS — E2740 Unspecified adrenocortical insufficiency: Secondary | ICD-10-CM | POA: Diagnosis present

## 2021-03-30 DIAGNOSIS — J9600 Acute respiratory failure, unspecified whether with hypoxia or hypercapnia: Secondary | ICD-10-CM | POA: Diagnosis present

## 2021-03-30 DIAGNOSIS — R571 Hypovolemic shock: Secondary | ICD-10-CM | POA: Diagnosis not present

## 2021-03-30 DIAGNOSIS — I480 Paroxysmal atrial fibrillation: Secondary | ICD-10-CM | POA: Diagnosis present

## 2021-03-30 DIAGNOSIS — K921 Melena: Secondary | ICD-10-CM | POA: Diagnosis not present

## 2021-03-30 DIAGNOSIS — Y829 Unspecified medical devices associated with adverse incidents: Secondary | ICD-10-CM | POA: Diagnosis present

## 2021-03-30 DIAGNOSIS — K922 Gastrointestinal hemorrhage, unspecified: Secondary | ICD-10-CM | POA: Diagnosis not present

## 2021-03-30 DIAGNOSIS — Z4659 Encounter for fitting and adjustment of other gastrointestinal appliance and device: Secondary | ICD-10-CM

## 2021-03-30 DIAGNOSIS — Z888 Allergy status to other drugs, medicaments and biological substances status: Secondary | ICD-10-CM

## 2021-03-30 DIAGNOSIS — R579 Shock, unspecified: Secondary | ICD-10-CM

## 2021-03-30 DIAGNOSIS — R069 Unspecified abnormalities of breathing: Secondary | ICD-10-CM

## 2021-03-30 DIAGNOSIS — G4733 Obstructive sleep apnea (adult) (pediatric): Secondary | ICD-10-CM | POA: Diagnosis present

## 2021-03-30 DIAGNOSIS — T8612 Kidney transplant failure: Secondary | ICD-10-CM | POA: Diagnosis present

## 2021-03-30 DIAGNOSIS — Z7901 Long term (current) use of anticoagulants: Secondary | ICD-10-CM

## 2021-03-30 DIAGNOSIS — K219 Gastro-esophageal reflux disease without esophagitis: Secondary | ICD-10-CM | POA: Diagnosis present

## 2021-03-30 DIAGNOSIS — E875 Hyperkalemia: Secondary | ICD-10-CM | POA: Diagnosis present

## 2021-03-30 DIAGNOSIS — Z01818 Encounter for other preprocedural examination: Secondary | ICD-10-CM

## 2021-03-30 DIAGNOSIS — R55 Syncope and collapse: Secondary | ICD-10-CM

## 2021-03-30 DIAGNOSIS — I2510 Atherosclerotic heart disease of native coronary artery without angina pectoris: Secondary | ICD-10-CM | POA: Diagnosis present

## 2021-03-30 DIAGNOSIS — Z7952 Long term (current) use of systemic steroids: Secondary | ICD-10-CM

## 2021-03-30 DIAGNOSIS — Z7982 Long term (current) use of aspirin: Secondary | ICD-10-CM

## 2021-03-30 HISTORY — DX: Presence of coronary angioplasty implant and graft: Z95.5

## 2021-03-30 LAB — LACTIC ACID, PLASMA
Lactic Acid, Venous: 3.4 mmol/L (ref 0.5–1.9)
Lactic Acid, Venous: 3.3 mmol/L (ref 0.5–1.9)

## 2021-03-30 LAB — BASIC METABOLIC PANEL
Anion gap: 16 — ABNORMAL HIGH (ref 5–15)
BUN: 182 mg/dL — ABNORMAL HIGH (ref 6–20)
CO2: 21 mmol/L — ABNORMAL LOW (ref 22–32)
Calcium: 7.1 mg/dL — ABNORMAL LOW (ref 8.9–10.3)
Chloride: 100 mmol/L (ref 98–111)
Creatinine, Ser: 13.53 mg/dL — ABNORMAL HIGH (ref 0.61–1.24)
GFR, Estimated: 4 mL/min — ABNORMAL LOW (ref >60)
Glucose, Bld: 107 mg/dL — ABNORMAL HIGH (ref 70–99)
Potassium: 4.2 mmol/L (ref 3.5–5.1)
Sodium: 137 mmol/L (ref 135–145)

## 2021-03-30 LAB — CBC WITH DIFFERENTIAL/PLATELET
Abs Immature Granulocytes: 0.06 10*3/uL (ref 0.00–0.07)
Abs Immature Granulocytes: 0.26 10*3/uL — ABNORMAL HIGH (ref 0.00–0.07)
Basophils Absolute: 0 10*3/uL (ref 0.0–0.1)
Basophils Absolute: 0 10*3/uL (ref 0.0–0.1)
Basophils Relative: 0 %
Basophils Relative: 0 %
Eosinophils Absolute: 0.5 10*3/uL (ref 0.0–0.5)
Eosinophils Absolute: 0 10*3/uL (ref 0.0–0.5)
Eosinophils Relative: 4 %
Eosinophils Relative: 0 %
HCT: 15.3 % — ABNORMAL LOW (ref 39.0–52.0)
HCT: 19.2 % — ABNORMAL LOW (ref 39.0–52.0)
Hemoglobin: 4.6 g/dL — CL (ref 13.0–17.0)
Hemoglobin: 6.3 g/dL — CL (ref 13.0–17.0)
Immature Granulocytes: 0 %
Immature Granulocytes: 1 %
Lymphocytes Relative: 17 %
Lymphocytes Relative: 4 %
Lymphs Abs: 2.4 10*3/uL (ref 0.7–4.0)
Lymphs Abs: 0.9 10*3/uL (ref 0.7–4.0)
MCH: 30.5 pg (ref 26.0–34.0)
MCH: 31.5 pg (ref 26.0–34.0)
MCHC: 30.1 g/dL (ref 30.0–36.0)
MCHC: 32.8 g/dL (ref 30.0–36.0)
MCV: 101.3 fL — ABNORMAL HIGH (ref 80.0–100.0)
MCV: 96 fL (ref 80.0–100.0)
Monocytes Absolute: 0.9 10*3/uL (ref 0.1–1.0)
Monocytes Absolute: 0.5 10*3/uL (ref 0.1–1.0)
Monocytes Relative: 7 %
Monocytes Relative: 2 %
Neutro Abs: 9.8 10*3/uL — ABNORMAL HIGH (ref 1.7–7.7)
Neutro Abs: 24 10*3/uL — ABNORMAL HIGH (ref 1.7–7.7)
Neutrophils Relative %: 72 %
Neutrophils Relative %: 93 %
Platelets: 183 10*3/uL (ref 150–400)
Platelets: 184 10*3/uL (ref 150–400)
RBC: 1.51 MIL/uL — ABNORMAL LOW (ref 4.22–5.81)
RBC: 2 MIL/uL — ABNORMAL LOW (ref 4.22–5.81)
RDW: 16.9 % — ABNORMAL HIGH (ref 11.5–15.5)
RDW: 16.3 % — ABNORMAL HIGH (ref 11.5–15.5)
WBC: 13.6 10*3/uL — ABNORMAL HIGH (ref 4.0–10.5)
WBC: 25.7 10*3/uL — ABNORMAL HIGH (ref 4.0–10.5)
nRBC: 0 % (ref 0.0–0.2)
nRBC: 0.1 % (ref 0.0–0.2)

## 2021-03-30 LAB — GLUCOSE, CAPILLARY: Glucose-Capillary: 129 mg/dL — ABNORMAL HIGH (ref 70–99)

## 2021-03-30 LAB — TROPONIN I (HIGH SENSITIVITY)
Troponin I (High Sensitivity): 61 ng/L — ABNORMAL HIGH (ref <18)
Troponin I (High Sensitivity): 58 ng/L — ABNORMAL HIGH (ref <18)

## 2021-03-30 LAB — RESP PANEL BY RT-PCR (FLU A&B, COVID) ARPGX2
Influenza A by PCR: NEGATIVE
Influenza B by PCR: NEGATIVE
SARS Coronavirus 2 by RT PCR: NEGATIVE

## 2021-03-30 LAB — ABO/RH: ABO/RH(D): O POS

## 2021-03-30 LAB — PREPARE RBC (CROSSMATCH)

## 2021-03-30 LAB — POC OCCULT BLOOD, ED: Fecal Occult Bld: POSITIVE — AB

## 2021-03-30 LAB — MAGNESIUM: Magnesium: 1.9 mg/dL (ref 1.7–2.4)

## 2021-03-30 LAB — MRSA PCR SCREENING: MRSA by PCR: POSITIVE — AB

## 2021-03-30 LAB — CORTISOL: Cortisol, Plasma: 8.7 ug/dL

## 2021-03-30 MED ORDER — LIDOCAINE-PRILOCAINE 2.5-2.5 % EX CREA
1.0000 "application " | TOPICAL_CREAM | CUTANEOUS | Status: DC | PRN
  Filled 2021-03-30: qty 5

## 2021-03-30 MED ORDER — MUPIROCIN 2 % EX OINT
1.0000 "application " | TOPICAL_OINTMENT | Freq: Two times a day (BID) | CUTANEOUS | Status: AC
  Administered 2021-03-31 – 2021-04-04 (×9): 1 via NASAL
  Filled 2021-03-30 (×3): qty 22

## 2021-03-30 MED ORDER — NOREPINEPHRINE 4 MG/250ML-% IV SOLN: 0.0000 ug/min | INTRAVENOUS | Status: DC

## 2021-03-30 MED ORDER — LORAZEPAM 2 MG/ML IJ SOLN: 2.0000 mg | INTRAMUSCULAR | Status: DC | PRN

## 2021-03-30 MED ORDER — LIDOCAINE HCL (PF) 1 % IJ SOLN: 5.0000 mL | INTRAMUSCULAR | Status: DC | PRN

## 2021-03-30 MED ORDER — SODIUM CHLORIDE 0.9 % IV SOLN: 250.0000 mL | INTRAVENOUS | Status: DC | PRN

## 2021-03-30 MED ORDER — SODIUM CHLORIDE 0.9% FLUSH
3.0000 mL | Freq: Two times a day (BID) | INTRAVENOUS | Status: DC
  Administered 2021-03-31 – 2021-04-06 (×11): 3 mL via INTRAVENOUS

## 2021-03-30 MED ORDER — SODIUM CHLORIDE 0.9% FLUSH: 3.0000 mL | INTRAVENOUS | Status: DC | PRN

## 2021-03-30 MED ORDER — LORAZEPAM 2 MG/ML IJ SOLN
INTRAMUSCULAR | Status: AC
  Administered 2021-03-30: 2 mg
  Filled 2021-03-30: qty 1

## 2021-03-30 MED ORDER — FEBUXOSTAT 40 MG PO TABS
40.0000 mg | ORAL_TABLET | Freq: Every day | ORAL | Status: DC
  Administered 2021-04-01 – 2021-04-07 (×7): 40 mg via ORAL
  Filled 2021-03-30 (×10): qty 1

## 2021-03-30 MED ORDER — DARBEPOETIN ALFA 200 MCG/0.4ML IJ SOSY
200.0000 ug | PREFILLED_SYRINGE | INTRAMUSCULAR | Status: DC
  Administered 2021-03-30 – 2021-04-06 (×2): 200 ug via INTRAVENOUS
  Filled 2021-03-30 (×2): qty 0.4

## 2021-03-30 MED ORDER — SODIUM CHLORIDE 0.9 % IV SOLN
80.0000 mg | Freq: Once | INTRAVENOUS | Status: AC
  Administered 2021-03-30: 80 mg via INTRAVENOUS
  Filled 2021-03-30: qty 80

## 2021-03-30 MED ORDER — SODIUM CHLORIDE 0.9 % IV SOLN: 100.0000 mL | INTRAVENOUS | Status: DC | PRN

## 2021-03-30 MED ORDER — ONDANSETRON HCL 4 MG PO TABS: 4.0000 mg | ORAL_TABLET | Freq: Four times a day (QID) | ORAL | Status: DC | PRN

## 2021-03-30 MED ORDER — AMIODARONE HCL 200 MG PO TABS
200.0000 mg | ORAL_TABLET | Freq: Every day | ORAL | Status: DC
  Administered 2021-04-01 – 2021-04-07 (×7): 200 mg via ORAL
  Filled 2021-03-30 (×7): qty 1

## 2021-03-30 MED ORDER — NOREPINEPHRINE 4 MG/250ML-% IV SOLN
0.0000 ug/min | INTRAVENOUS | Status: DC
Start: 2021-03-30 — End: 2021-03-31
  Administered 2021-03-30: 2 ug/min via INTRAVENOUS

## 2021-03-30 MED ORDER — SODIUM CHLORIDE 0.9 % IV SOLN
8.0000 mg/h | INTRAVENOUS | Status: AC
  Administered 2021-03-30 – 2021-04-02 (×6): 8 mg/h via INTRAVENOUS
  Filled 2021-03-30 (×9): qty 80

## 2021-03-30 MED ORDER — ONDANSETRON HCL 4 MG/2ML IJ SOLN: 4.0000 mg | Freq: Four times a day (QID) | INTRAMUSCULAR | Status: DC | PRN

## 2021-03-30 MED ORDER — SODIUM CHLORIDE 0.9 % IV BOLUS
500.0000 mL | Freq: Once | INTRAVENOUS | Status: AC
  Administered 2021-03-30: 500 mL via INTRAVENOUS

## 2021-03-30 MED ORDER — SODIUM CHLORIDE 0.9% IV SOLUTION
Freq: Once | INTRAVENOUS | Status: AC
Start: 2021-03-30 — End: 2021-03-30

## 2021-03-30 MED ORDER — SODIUM CHLORIDE 0.9% FLUSH: 10.0000 mL | INTRAVENOUS | Status: DC | PRN

## 2021-03-30 MED ORDER — CHLORHEXIDINE GLUCONATE CLOTH 2 % EX PADS
6.0000 | MEDICATED_PAD | Freq: Every day | CUTANEOUS | Status: DC
  Administered 2021-03-30 – 2021-04-07 (×9): 6 via TOPICAL

## 2021-03-30 MED ORDER — LEVETIRACETAM IN NACL 1000 MG/100ML IV SOLN
1000.0000 mg | Freq: Once | INTRAVENOUS | Status: AC
  Administered 2021-03-30: 1000 mg via INTRAVENOUS
  Filled 2021-03-30: qty 100

## 2021-03-30 MED ORDER — PIPERACILLIN-TAZOBACTAM 3.375 G IVPB 30 MIN
3.3750 g | Freq: Once | INTRAVENOUS | Status: AC
  Administered 2021-03-30: 3.375 g via INTRAVENOUS
  Filled 2021-03-30: qty 50

## 2021-03-30 MED ORDER — HYDROCORTISONE NA SUCCINATE PF 100 MG IJ SOLR
100.0000 mg | Freq: Three times a day (TID) | INTRAMUSCULAR | Status: DC
  Administered 2021-03-30 – 2021-04-01 (×6): 100 mg via INTRAVENOUS
  Filled 2021-03-30 (×7): qty 2

## 2021-03-30 MED ORDER — SODIUM CHLORIDE 0.9 % IV SOLN: 250.0000 mL | INTRAVENOUS | Status: DC

## 2021-03-30 MED ORDER — TACROLIMUS 0.5 MG PO CAPS
0.5000 mg | ORAL_CAPSULE | Freq: Every day | ORAL | Status: DC
  Filled 2021-03-30 (×2): qty 1

## 2021-03-30 MED ORDER — ETOMIDATE 2 MG/ML IV SOLN
INTRAVENOUS | Status: DC | PRN
  Administered 2021-03-30: 20 mg via INTRAVENOUS

## 2021-03-30 MED ORDER — SODIUM CHLORIDE 0.9 % IV SOLN
10.0000 mL/h | Freq: Once | INTRAVENOUS | Status: AC
  Administered 2021-03-30: 10 mL/h via INTRAVENOUS

## 2021-03-30 MED ORDER — ADULT MULTIVITAMIN W/MINERALS CH
1.0000 | ORAL_TABLET | Freq: Every day | ORAL | Status: DC
  Administered 2021-04-01 – 2021-04-07 (×7): 1 via ORAL
  Filled 2021-03-30 (×9): qty 1

## 2021-03-30 MED ORDER — ALBUMIN HUMAN 25 % IV SOLN
25.0000 g | Freq: Once | INTRAVENOUS | Status: AC
  Administered 2021-03-30: 25 g via INTRAVENOUS

## 2021-03-30 MED ORDER — NOREPINEPHRINE 4 MG/250ML-% IV SOLN
2.0000 ug/min | INTRAVENOUS | Status: DC
  Administered 2021-03-30: 2 ug/min via INTRAVENOUS
  Filled 2021-03-30: qty 250

## 2021-03-30 MED ORDER — SODIUM CHLORIDE 0.9% FLUSH
10.0000 mL | Freq: Two times a day (BID) | INTRAVENOUS | Status: DC
  Administered 2021-03-30 – 2021-03-31 (×3): 10 mL
  Administered 2021-04-01: 30 mL
  Administered 2021-04-01: 10 mL
  Administered 2021-04-02: 30 mL
  Administered 2021-04-02: 10 mL
  Administered 2021-04-03: 30 mL
  Administered 2021-04-03 – 2021-04-06 (×3): 10 mL

## 2021-03-30 MED ORDER — SODIUM THIOSULFATE 250 MG/ML IV SOLN
25.0000 g | INTRAVENOUS | Status: DC
  Administered 2021-03-30: 25 g via INTRAVENOUS
  Filled 2021-03-30 (×2): qty 100

## 2021-03-30 MED ORDER — ACETAMINOPHEN 500 MG PO TABS: 1000.0000 mg | ORAL_TABLET | Freq: Four times a day (QID) | ORAL | Status: DC | PRN

## 2021-03-30 MED ORDER — PIPERACILLIN-TAZOBACTAM 4.5 G IVPB: 4.5000 g | Freq: Once | INTRAVENOUS | Status: DC

## 2021-03-30 MED ORDER — PENTAFLUOROPROP-TETRAFLUOROETH EX AERO
1.0000 "application " | INHALATION_SPRAY | CUTANEOUS | Status: DC | PRN
  Filled 2021-03-30: qty 116

## 2021-03-30 MED ORDER — ROCURONIUM 10MG/ML (10ML) SYRINGE FOR MEDFUSION PUMP - OPTIME
INTRAVENOUS | Status: DC | PRN
  Administered 2021-03-30: 80 mg via INTRAVENOUS

## 2021-03-30 NOTE — Anesthesia Procedure Notes (Signed)
Procedure Name: Intubation Performed by: Louann Sjogren, MD Pre-anesthesia Checklist: Patient identified, Emergency Drugs available, Suction available, Patient being monitored and Timeout performed Patient Re-evaluated:Patient Re-evaluated prior to induction Oxygen Delivery Method: Circle system utilized Preoxygenation: Pre-oxygenation with 100% oxygen Induction Type: IV induction Ventilation: Two handed mask ventilation required Laryngoscope Size: Glidescope and 3 Grade View: Grade I Tube type: Oral Tube size: 7.0 mm Number of attempts: 1 Airway Equipment and Method: Video-laryngoscopy and Stylet Placement Confirmation: ETT inserted through vocal cords under direct vision,  positive ETCO2 and breath sounds checked- equal and bilateral Secured at: 26 cm Tube secured with: Tape Dental Injury: Teeth and Oropharynx as per pre-operative assessment

## 2021-03-30 NOTE — ED Notes (Signed)
To hang Protonix following zosyn infusion.

## 2021-03-30 NOTE — Transfer of Care (Signed)
Immediate Anesthesia Transfer of Care Note  Patient: [name]  Procedure(s) Performed: AN AD Banks INTUBATION  Patient Location: ICU  Anesthesia Type:General  Level of Consciousness: Patient remains intubated per anesthesia plan  Airway & Oxygen Therapy: Patient remains intubated per anesthesia plan and Patient placed on Ventilator (see vital sign flow sheet for setting)  Post-op Assessment: Report given to RN and Post -op Vital signs reviewed and stable  Post vital signs: Reviewed and stable  Last Vitals:  Vitals Value Taken Time  BP 117/63 03/30/21 2345  Temp    Pulse 88 03/30/21 2342  Resp 17 03/30/21 2345  SpO2 100 % 03/30/21 2342  Vitals shown include unvalidated device data.  Last Pain:  Vitals:   03/30/21 2300  TempSrc: Oral  PainSc:          Complications: No complications documented.

## 2021-03-30 NOTE — ED Notes (Signed)
Dr Almyra Free to attempt IJ/EJ.  Nephrologist at bedside.

## 2021-03-30 NOTE — Progress Notes (Signed)
Dr. Jenkins at bedside to place central line.  

## 2021-03-30 NOTE — ED Notes (Signed)
Tolerating blood product well.
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
## 2021-03-30 NOTE — ED Notes (Signed)
Wife at bedside.  Lab tech drawing labs.
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
## 2021-03-30 NOTE — ED Notes (Signed)
No blood reaction noted.
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
## 2021-03-30 NOTE — Progress Notes (Signed)
I was paged by RN due to acute change in patient's status.  During dialysis, he reportedly had 6 maroon stools.  He subsequently had an episode of hematemesis.  He then suffered a seizure and became hypotensive.  Central line was placed and patient was started on Levophed.  Most recent hemoglobin of 6.3 at 1851. Another unit of PRBCs has been ordered.  On my exam, patient is nonverbal.  He does not respond to commands.  Does respond to stimuli.  Blood pressure has improved and he is actually hypertensive now.  Levophed has been stopped.  Patient is being transported for stat CT of his head now.  Discussed case with anesthesia.  Given the complex nature of patient's clinical course including active GI bleed, new onset seizure, as well as comorbidities including ESRD, CAD with recent drug-eluting stent on 03/26/2021, recommendation is to transfer to higher level of care.  Discussed case with hospitalist Dr. Clearence Ped who agrees and will start this process. Please do not hesitate to call or page for any questions/concerns.

## 2021-03-30 NOTE — Progress Notes (Signed)
Rapid response called d/t change in patient's status. Patient had lower blood pressures while receiving dialysis. Levophed started per order. Patient had not been responding to painful stimuli, only to a deep sternal rub. Patient then had an outburst, yelled, and then started seizing. Witnessed seizure for about a minute. Respiratory at bedside, Dialysis nurse at bedside along with other nurses. Ativan '2mg'$  given IV. Mother stated no history of seizure that she knew. Wife said the same. At time of witnessed seizure, patient vomited blood. The blood looked bright red with some sputum. Dr. Dyann Kief notified and placed orders for keppra. Dr. Denton Brick at bedside. Dr. Abbey Chatters notified. Dr. Abbey Chatters to be here in 30 minutes and order placed for patient to receive 1 unit of blood. Patient seems to be postictal.

## 2021-03-30 NOTE — ED Notes (Signed)
Tolerating blood product well.  Blood verified with Tana [place], RN.  Wife at bedside.

## 2021-03-30 NOTE — Op Note (Signed)
Asked to place an emergent femoral central line due to the need for pressor support.  Patient is in end-stage renal disease on dialysis.  Due to the emergent nature of the procedure, informed consent could not be obtained.  The left groin was prepped using ChloraPrep.  Surgical site confirmation was performed.  Full sterile technique with gown and gloves were used.  The needle was advanced into the left femoral vein using the Seldinger technique without difficulty.  A guidewire was then inserted.  An introducer was placed over the guidewire.  A triple-lumen catheter was then inserted over the guidewire and the guidewire was removed.  It was secured in place using 3-0 silk sutures.  Good backflow of venous blood was noted on aspiration of all 3 ports.  All 3 ports were flushed with saline.  A dry sterile dressing was applied.  The patient tolerated the procedure well.

## 2021-03-30 NOTE — Progress Notes (Signed)
Called to patient bedside with reports of new onset seizures, no history of same.  Seizure was witnessed by nurse and lasted about a minute, also noted was that patient vomited blood at the time of seizure.   2 mg Ativan, and Keppra was given. History of ESRD, received 3.5 hours of HD today.  Admitted with acute anemia from GI bleed on anticoagulation with Eliquis for atria fibrillation.  Hemoglobin was 4.6 on admission, he has received 2 units PRBC.  He was also hypotensive but blood pressures currently improved on peripheral Levophed.  Plans for central line placement 2/9 by Dr. Arnoldo Morale. At the time of my evaluation, patient is still postictal, with snoring respirations, pupils are equal but not very reactive.  He is maintaining his airway. Plan - Check posttransfusion H&H - Obtain Stat head CT.  - Seizure precautions -Obtain neurology consult in the morning -Ativan 2 mg as needed -1 g loading dose Keppra given, further dosing pending neurology evaluation, clinical course. -N.p.o. -EEG -MRI brain for a.m - GI to see, additional 1 unit of blood ordered.   Bing Neighbors, MD [facility] 7:29 PM 03/30/21

## 2021-03-30 NOTE — ED Notes (Signed)
Several attempts for additional IV without success.

## 2021-03-30 NOTE — Anesthesia Postprocedure Evaluation (Signed)
Anesthesia Post Note  Patient: [name]  Procedure(s) Performed: AN AD [place]  Patient location during evaluation: ICU Anesthesia Type: General Level of consciousness: patient remains intubated per anesthesia plan Pain management: pain level controlled Vital Signs Assessment: post-procedure vital signs reviewed and stable Respiratory status: patient on ventilator - see flowsheet for VS and patient remains intubated per anesthesia plan Cardiovascular status: blood pressure returned to baseline and stable Postop Assessment: no apparent nausea or vomiting Anesthetic complications: no   No complications documented.   Last Vitals:  Vitals:   03/30/21 2357 03/30/21 2358  BP:    Pulse: 90 95  Resp: 17 13  Temp:    SpO2: 100% 100%    Last Pain:  Vitals:   03/30/21 2300  TempSrc: Oral  PainSc:                  Louann Sjogren

## 2021-03-30 NOTE — Progress Notes (Signed)
Critical care attending  Discussed with on-call hospitalist.  Agree that patient is complicated and likely warrants endoscopy at a higher level of care.  However has just had a significant episode of emesis and is postictal following seizure.  Do not feel it is safe for the patient to transport without a protected airway.  Have requested patient be intubated prior to transport.  I have excepted him in transfer to the ICU at [facility], MD [facility] ICU Physician [place]  Pager: [phone] Or Epic Secure Chat After hours: [phone].  03/30/2021, 9:18 PM
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
## 2021-03-30 NOTE — Consult Note (Addendum)
NAME:  [name], MRN:  [id number], DOB:  14-Jul-1966, LOS: 0 ADMISSION DATE:  03/30/2021, CONSULTATION DATE:  03/30/21 REFERRING MD: [facility] Hospitalist , CHIEF COMPLAINT:  UGIB   History of Present Illness:  [name] is a 55 y.o. M with PMH of Atrial Fibrillation on Eliquis, ESRD s/p renal transplant on dialysis and chronic prednisone and prograf, CAD s/p recent stent, HTN, HFpEF with EF 60-65% who presented to [facility] ED with a chief complaint of syncope.  Per records, pt was about to leave for dialysis when he passed out and woke up on the ground.  Work-up in the ED revealed Hgb of 4.6 and hypotension.  He was given IVF, PPI and transfusion and admitted to the critical care unit.  After admission, pt had several episodes of hematemesis and melena and then new onset seizure activity.  Stat head CT was negative.  Pt intubated and transferred to [facility] for emergent EGD  Pertinent  Medical History   has a past medical history of Atrial fibrillation ([place]), CHF (congestive heart failure) ([place]), Chronic kidney disease, Coronary artery disease, Dysrhythmia, H/O heart artery stent, HLD (hyperlipidemia), Hypertension, Kidney transplanted, Pneumonia, and Sleep apnea.   Significant Hospital Events: Including procedures, antibiotic start and stop dates in addition to other pertinent events   . 5/16 Admit to [facility], episodes of hematemesis and seizure, transfer to [facility].  Transfused 3 units PRBC's  Tubes/lines/drains ETT 5/16- CVC 5/16-   Antibiotics Zosyn 5/16-     Interim History / Subjective:  Pt arrived to [facility] on propofol and 40mg Levophed  Objective   Blood pressure (!) 125/95, pulse (!) 108, temperature (!) 97.5 F (36.4 C), temperature source Oral, resp. rate (!) 55, height '6\' 1"'$  (1.854 m), weight 99.8 kg, SpO2 100 %.        Intake/Output Summary (Last 24 hours) at 03/30/2021 2041 Last data filed at 03/30/2021 1830 Gross per 24 hour  Intake 1279.2 ml  Output -1437 ml  Net  2716.2 ml   Filed Weights   03/30/21 [id number]/16/22 1343 03/30/21 1450  Weight: 97 kg 99.8 kg 99.8 kg   General:  Well-nourished intubated M, sedated and in no distress HEENT: MM pink/moist, ETT in place, no current hematemesis, sclera anicteric Neuro: examined on 538m propofol, RASS -4 CV: s1s2 regular, mildly tachycardic , no m/r/g PULM:  Intubated with mechanical breath breath sounds bilaterally  GI: soft, bsx4 active  Extremities: warm/dry, no edema  Skin: no rashes or lesions  Labs/imaging that I havepersonally reviewed  (right click and "Reselect all SmartList Selections" daily)  CBC BMP CXR  Resolved Hospital Problem list     Assessment & Plan:   Hemorrhagic Shock secondary to UGIB Transfused 3 units and  Hgb rose from 4.6 to 7.4 [facility] P: -Repeat CBC pending transfuse for Hgb <7 -Spoke with Dr. PuGermain Osgoodwill plan for EGD early this AM -continue protonix gtt - almost 24hrs since admission and no current hematemesis, no indication for Andexxa  -Ca gluconate -continue Levophed to maintain MAP >65 -started on stress dose steroids in the setting of chronic steroids       Seizure with post-ictal respiratory compromise New onset seizure like activity in the setting of shock P: -head CT without acute findings  -no hypoglycemia, or major electrolyte abnormalities -EEG -seizure precautions -on propfol and Keppra   CAD s/p stent, HTN, atrial fibrillation, HFpEF,  Stent less than one week ago P: -holding Asa and Eliquis -monitor  -  will likely need cards consult regarding resumption of anti-platelet and anti-coagulation -continue amiodarone   ESRD w/ hx of renal transplant  M/W/F diaylsis P: -seen by nephrology at [facility] -has L AVF -plan for dialysis later today -continue prograf   Best practice (right click and "Reselect all SmartList Selections" daily)  Diet:  NPO Pain/Anxiety/Delirium protocol (if indicated): Yes (RASS goal -1) VAP  protocol (if indicated): Yes DVT prophylaxis: Contraindicated GI prophylaxis: PPI Glucose control:  SSI Yes Central venous access:  Yes, and it is still needed Arterial line:  N/A Foley:  Yes, and it is still needed Mobility:  bed rest  PT consulted: N/A Last date of multidisciplinary goals of care discussion [pending] Code Status:  full code Disposition: ICU  Labs   CBC: Recent Labs  Lab 03/30/21 0737 03/30/21 1851  WBC 13.6* 25.7*  NEUTROABS 9.8* 24.0*  HGB 4.6* 6.3*  HCT 15.3* 19.2*  MCV 101.3* 96.0  PLT 183 [id number]    Basic Metabolic Panel: Recent Labs  Lab 03/30/21 0737  NA 137  K 4.2  CL 100  CO2 21*  GLUCOSE 107*  BUN 182*  CREATININE 13.53*  CALCIUM 7.1*  MG 1.9   GFR: Estimated Creatinine Clearance: 7.9 mL/min (A) (by C-G formula based on SCr of 13.53 mg/dL (H)). Recent Labs  Lab 03/30/21 0737 03/30/21 0833 03/30/21 1038 03/30/21 1851  WBC 13.6*  --   --  25.7*  LATICACIDVEN  --  3.4* 3.3*  --     Liver Function Tests: No results for input(s): AST, ALT, ALKPHOS, BILITOT, PROT, ALBUMIN in the last 168 hours. No results for input(s): LIPASE, AMYLASE in the last 168 hours. No results for input(s): AMMONIA in the last 168 hours.  ABG    Component Value Date/Time   TCO2 28 12/09/2020 0945     Coagulation Profile: No results for input(s): INR, PROTIME in the last 168 hours.  Cardiac Enzymes: No results for input(s): CKTOTAL, CKMB, CKMBINDEX, TROPONINI in the last 168 hours.  HbA1C: No results found for: HGBA1C  CBG: Recent Labs  Lab 03/30/21 1824  GLUCAP 129*    Review of Systems:   Unable to obtain secondary to intubated  Past Medical History:  He,  has a past medical history of Atrial fibrillation ([place]), CHF (congestive heart failure) ([place]), Chronic kidney disease, Coronary artery disease, Dysrhythmia, H/O heart artery stent, HLD (hyperlipidemia), Hypertension, Kidney transplanted, Pneumonia, and Sleep apnea.   Surgical History:    Past Surgical History:  Procedure Laterality Date  . ABLATION    . CHOLECYSTECTOMY    . COLONOSCOPY WITH PROPOFOL N/A 12/09/2020   non-bleeding internal hemorrhoids, sigmoid and descending colon diverticulosis, stool in entire examined colon.   Marland Kitchen HERNIA MESH REMOVAL     abdominal  . TRANSPLANTATION RENAL  2005     Social History:   reports that he has never smoked. He has never used smokeless tobacco. He reports that he does not drink alcohol and does not use drugs.   Family History:  His family history includes Heart attack in his father, paternal aunt, paternal uncle, and paternal uncle; Hypertension in his maternal grandmother and mother; Stroke in his maternal grandfather and maternal uncle.   Allergies Allergies  Allergen Reactions  . Codeine     Unknown reaction  . Vancomycin Swelling     Home Medications  Prior to Admission medications   Medication Sig Start Date End Date Taking? Authorizing Provider  acetaminophen (TYLENOL) 500 MG tablet Take 1,000 mg by  mouth every 6 (six) hours as needed for moderate pain or headache.   Yes [provider]  amiodarone (PACERONE) 200 MG tablet Take 200 mg by mouth daily.   Yes [provider]  apixaban (ELIQUIS) 5 MG TABS tablet Take 5 mg by mouth 2 (two) times daily.   Yes [provider]  Ascorbic Acid (VITAMIN C PO) Take 1 tablet by mouth daily.   Yes [provider]  aspirin 81 MG chewable tablet Chew 81 mg by mouth daily.   Yes [provider]  atorvastatin (LIPITOR) 80 MG tablet Take 80 mg by mouth daily.   Yes [provider]  calcium elemental as carbonate (TUMS ULTRA 1000) 400 MG chewable tablet Chew 2,000 mg by mouth 2 (two) times daily.   Yes [provider]  Cholecalciferol (VITAMIN D3 PO) Take 1 tablet by mouth daily.   Yes [provider]  famotidine (PEPCID) 20 MG tablet Take 20 mg by mouth 2 (two) times daily. 03/26/21  Yes [provider]   febuxostat (ULORIC) 40 MG tablet Take 40 mg by mouth daily.   Yes [provider]  ferric citrate (AURYXIA) 1 GM 210 MG(Fe) tablet Take 210 mg by mouth 3 (three) times daily with meals.    Yes [provider]  Multiple Vitamins-Minerals (MULTIVITAMIN WITH MINERALS) tablet Take 1 tablet by mouth daily.   Yes [provider]  prasugrel (EFFIENT) 10 MG TABS tablet Take 10 mg by mouth daily.   Yes [provider]  predniSONE (DELTASONE) 10 MG tablet Take 10 mg by mouth daily with breakfast.   Yes [provider]  tacrolimus (PROGRAF) 0.5 MG capsule Take 0.5 mg by mouth daily.   Yes [provider]  tadalafil (CIALIS) 20 MG tablet Take 40 mg by mouth daily.   Yes [provider]  tamsulosin (FLOMAX) 0.4 MG CAPS capsule Take 0.4 mg by mouth daily.    Yes [provider]  Wound Dressings (HYDROFERA BLUE FOAM DRESSING EX) Apply 1 patch topically every other day.    [provider]     Critical care time: 50 minutes       CRITICAL CARE Performed by: Otilio Carpen Thadeus Gandolfi   Total critical care time: 50 minutes  Critical care time was exclusive of separately billable procedures and treating other patients.  Critical care was necessary to treat or prevent imminent or life-threatening deterioration.  Critical care was time spent personally by me on the following activities: development of treatment plan with patient and/or surrogate as well as nursing, discussions with consultants, evaluation of patient's response to treatment, examination of patient, obtaining history from patient or surrogate, ordering and performing treatments and interventions, ordering and review of laboratory studies, ordering and review of radiographic studies, pulse oximetry and re-evaluation of patient's condition.   Otilio Carpen Pearlean Sabina, PA-C [facility] See Amion for pager If no response to pager , please call [phone] until  7pm After 7:00 pm call Elink  [phone]?[place]

## 2021-03-30 NOTE — Anesthesia Preprocedure Evaluation (Addendum)
Anesthesia Evaluation  Patient identified by MRN, date of birth, ID band Patient awake    Reviewed: Allergy & Precautions, H&P , NPO status , Patient's Chart, lab work & pertinent test results, reviewed documented beta blocker date and time   Airway Mallampati: II  TM Distance: >3 FB Neck ROM: full    Dental no notable dental hx. (+) Teeth Intact   Pulmonary sleep apnea , pneumonia, resolved,    Pulmonary exam normal breath sounds clear to auscultation       Cardiovascular Exercise Tolerance: Good hypertension, + CAD and +CHF  + dysrhythmias Atrial Fibrillation  Rhythm:irregular Rate:Tachycardia     Neuro/Psych negative neurological ROS  negative psych ROS   GI/Hepatic Neg liver ROS, GERD  Medicated,  Endo/Other  negative endocrine ROS  Renal/GU ESRF and DialysisRenal disease  negative genitourinary   Musculoskeletal negative musculoskeletal ROS (+)   Abdominal   Peds negative pediatric ROS (+)  Hematology  (+) Blood dyscrasia, anemia ,   Anesthesia Other Findings Plan modified RSI, using Rocc instead of Succ (ESRD).  Reproductive/Obstetrics negative OB ROS                            Anesthesia Physical Anesthesia Plan  ASA: IV and emergent  Anesthesia Plan: General   Post-op Pain Management:    Induction: Intravenous and Rapid sequence  PONV Risk Score and Plan:   Airway Management Planned:   Additional Equipment:   Intra-op Plan:   Post-operative Plan:   Informed Consent: I have reviewed the patients History and Physical, chart, labs and discussed the procedure including the risks, benefits and alternatives for the proposed anesthesia with the patient or authorized representative who has indicated his/her understanding and acceptance.     Dental Advisory Given  Plan Discussed with: CRNA  Anesthesia Plan Comments:         Anesthesia Quick Evaluation

## 2021-03-30 NOTE — ED Notes (Signed)
Blood stopped, no reaction noted.  Report called to ICU Nurse.

## 2021-03-30 NOTE — ED Triage Notes (Signed)
BIB EMS after syncopal episode while getting ready for dialysis. Recent stent placement on Thursday in [place].

## 2021-03-30 NOTE — Procedures (Addendum)
   HEMODIALYSIS TREATMENT NOTE:  3.75 hour heparin-free HD completed via left forearm AVF (15g/antegrade). Kept even / no UF as per nephrologist's order.  Labile blood pressures during first half of treatment.  "They pull fluid until I drop [SBP] below 80."  States has tried midodrine in the past with "the opposite effect".    Had one syncopal-like episode of unresponsiveness x 60-90 seconds at 1530 relieved with 200cc NS bolus.  Pt alert afterwards, chuckling "I didn't mean to scare y'all."  He further explained "that's what happened this morning." One unit pRBCs transfused (his second unit today--first one given in ED).   Of note, he had 3 maroon-colored liquid stools during treatment.  After transfusion, one dose of Albumin 25g was given for persistent hypotension.  This had no effect and Dr. Dyann Kief ordered levophed initiation.  ICU nurses made several attempts to insert PIV with US guidance.  Pt was resting with eyes closed throughout these efforts but was noted to be increasingly lethargic, ultimately barely responding to sternal rub.  Dr. Dyann Kief was apprised of change in pt's status via secure chat and several orders were received.  HD was ended after d/w Dr. Moshe Cipro as pt had a 45-second seizure.  All blood was returned and hemostasis was achieved in 15 minutes.  Pt was assessed by Dr. Denton Brick.  Dr. Arnoldo Morale also arrived to place central line.  Post-HD pt remained responsive to painful stimuli only.    Total run time: 3 hours 45 minutes. Net UF: [id number]

## 2021-03-30 NOTE — H&P (Signed)
Dr. Abbey Chatters called to inform that patient needs to be transferred, as our anesthesia is not comfortable with doing scope here. I have had multiple calls with PCCM. They are requesting that patient is intubated for transport, but have accepted the transfer. AC was contacted to arrange intubation with anesthesia. AC reports that she has spoken with anesthesia, but that he is 60 miles away. Patient is currently getting his third unit of blood. I will order another unit, as it seems he will have time to get it here before he gets transferred. I spoke with family at bedside - wife and mother - who understand and are in agreement with plan.

## 2021-03-31 ENCOUNTER — Encounter (HOSPITAL_COMMUNITY)
Admission: EM | Disposition: A | Discharge status: Home / Self Care | Payer: Self-pay | Source: Home / Self Care | Attending: Pulmonary Disease

## 2021-03-31 ENCOUNTER — Inpatient Hospital Stay (HOSPITAL_COMMUNITY): Payer: Medicare Other

## 2021-03-31 ENCOUNTER — Encounter (HOSPITAL_COMMUNITY): Payer: Self-pay

## 2021-03-31 DIAGNOSIS — K922 Gastrointestinal hemorrhage, unspecified: Secondary | ICD-10-CM | POA: Diagnosis not present

## 2021-03-31 DIAGNOSIS — R55 Syncope and collapse: Secondary | ICD-10-CM | POA: Diagnosis not present

## 2021-03-31 DIAGNOSIS — D500 Iron deficiency anemia secondary to blood loss (chronic): Secondary | ICD-10-CM | POA: Diagnosis not present

## 2021-03-31 DIAGNOSIS — K31811 Angiodysplasia of stomach and duodenum with bleeding: Secondary | ICD-10-CM | POA: Diagnosis not present

## 2021-03-31 DIAGNOSIS — Z955 Presence of coronary angioplasty implant and graft: Secondary | ICD-10-CM | POA: Diagnosis not present

## 2021-03-31 DIAGNOSIS — R579 Shock, unspecified: Secondary | ICD-10-CM

## 2021-03-31 DIAGNOSIS — R571 Hypovolemic shock: Secondary | ICD-10-CM

## 2021-03-31 DIAGNOSIS — K921 Melena: Secondary | ICD-10-CM

## 2021-03-31 DIAGNOSIS — R569 Unspecified convulsions: Secondary | ICD-10-CM | POA: Diagnosis not present

## 2021-03-31 HISTORY — PX: ESOPHAGOGASTRODUODENOSCOPY (EGD) WITH PROPOFOL: SHX5813

## 2021-03-31 HISTORY — PX: HEMOSTASIS CONTROL: SHX6838

## 2021-03-31 LAB — POCT I-STAT 7, (LYTES, BLD GAS, ICA,H+H)
Acid-Base Excess: 2 mmol/L (ref 0.0–2.0)
Bicarbonate: 26.5 mmol/L (ref 20.0–28.0)
Calcium, Ion: 0.98 mmol/L — ABNORMAL LOW (ref 1.15–1.40)
HCT: 20 % — ABNORMAL LOW (ref 39.0–52.0)
Hemoglobin: 6.8 g/dL — CL (ref 13.0–17.0)
O2 Saturation: 99 %
Patient temperature: 98.3
Potassium: 4.9 mmol/L (ref 3.5–5.1)
Sodium: 133 mmol/L — ABNORMAL LOW (ref 135–145)
TCO2: 28 mmol/L (ref 22–32)
pCO2 arterial: 37.3 mmHg (ref 32.0–48.0)
pH, Arterial: 7.459 — ABNORMAL HIGH (ref 7.350–7.450)
pO2, Arterial: 132 mmHg — ABNORMAL HIGH (ref 83.0–108.0)

## 2021-03-31 LAB — TYPE AND SCREEN
ABO/RH(D): O POS
Antibody Screen: NEGATIVE
Unit division: 0
Unit division: 0
Unit division: 0
Unit division: 0

## 2021-03-31 LAB — CBC
HCT: 22.4 % — ABNORMAL LOW (ref 39.0–52.0)
HCT: 22.5 % — ABNORMAL LOW (ref 39.0–52.0)
HCT: 17.9 % — ABNORMAL LOW (ref 39.0–52.0)
Hemoglobin: 7.4 g/dL — ABNORMAL LOW (ref 13.0–17.0)
Hemoglobin: 7.6 g/dL — ABNORMAL LOW (ref 13.0–17.0)
Hemoglobin: 5.9 g/dL — CL (ref 13.0–17.0)
MCH: 31.1 pg (ref 26.0–34.0)
MCH: 30.6 pg (ref 26.0–34.0)
MCH: 30.9 pg (ref 26.0–34.0)
MCHC: 33 g/dL (ref 30.0–36.0)
MCHC: 33.8 g/dL (ref 30.0–36.0)
MCHC: 33 g/dL (ref 30.0–36.0)
MCV: 94.1 fL (ref 80.0–100.0)
MCV: 90.7 fL (ref 80.0–100.0)
MCV: 93.7 fL (ref 80.0–100.0)
Platelets: 203 10*3/uL (ref 150–400)
Platelets: 171 10*3/uL (ref 150–400)
Platelets: 183 10*3/uL (ref 150–400)
RBC: 2.38 MIL/uL — ABNORMAL LOW (ref 4.22–5.81)
RBC: 2.48 MIL/uL — ABNORMAL LOW (ref 4.22–5.81)
RBC: 1.91 MIL/uL — ABNORMAL LOW (ref 4.22–5.81)
RDW: 16.3 % — ABNORMAL HIGH (ref 11.5–15.5)
RDW: 15.8 % — ABNORMAL HIGH (ref 11.5–15.5)
RDW: 16.6 % — ABNORMAL HIGH (ref 11.5–15.5)
WBC: 24.6 10*3/uL — ABNORMAL HIGH (ref 4.0–10.5)
WBC: 19 10*3/uL — ABNORMAL HIGH (ref 4.0–10.5)
WBC: 21.6 10*3/uL — ABNORMAL HIGH (ref 4.0–10.5)
nRBC: 0.1 % (ref 0.0–0.2)
nRBC: 0.2 % (ref 0.0–0.2)
nRBC: 0.1 % (ref 0.0–0.2)

## 2021-03-31 LAB — BLOOD GAS, ARTERIAL
Acid-Base Excess: 0.2 mmol/L (ref 0.0–2.0)
Bicarbonate: 24.3 mmol/L (ref 20.0–28.0)
Drawn by: 38235
FIO2: 40
MECHVT: 640 mL
O2 Saturation: 62.7 %
PEEP: 5 cmH2O
Patient temperature: 37
RATE: 16 resp/min
pCO2 arterial: 39.6 mmHg (ref 32.0–48.0)
pH, Arterial: 7.406 (ref 7.350–7.450)
pO2, Arterial: 36.2 mmHg — CL (ref 83.0–108.0)

## 2021-03-31 LAB — BASIC METABOLIC PANEL
Anion gap: 13 (ref 5–15)
BUN: 94 mg/dL — ABNORMAL HIGH (ref 6–20)
CO2: 25 mmol/L (ref 22–32)
Calcium: 6.9 mg/dL — ABNORMAL LOW (ref 8.9–10.3)
Chloride: 97 mmol/L — ABNORMAL LOW (ref 98–111)
Creatinine, Ser: 8.37 mg/dL — ABNORMAL HIGH (ref 0.61–1.24)
GFR, Estimated: 7 mL/min — ABNORMAL LOW (ref >60)
Glucose, Bld: 168 mg/dL — ABNORMAL HIGH (ref 70–99)
Potassium: 4.4 mmol/L (ref 3.5–5.1)
Sodium: 135 mmol/L (ref 135–145)

## 2021-03-31 LAB — BPAM RBC
Blood Product Expiration Date: 202206032359
Blood Product Expiration Date: 202206112359
Blood Product Expiration Date: 202206132359
Blood Product Expiration Date: 202206182359
ISSUE DATE / TIME: 202205161050
ISSUE DATE / TIME: 202205161548
ISSUE DATE / TIME: 202205162036
Unit Type and Rh: 5100
Unit Type and Rh: 5100
Unit Type and Rh: 5100
Unit Type and Rh: 9500

## 2021-03-31 LAB — CBC WITH DIFFERENTIAL/PLATELET
Abs Immature Granulocytes: 0.13 10*3/uL — ABNORMAL HIGH (ref 0.00–0.07)
Basophils Absolute: 0 10*3/uL (ref 0.0–0.1)
Basophils Relative: 0 %
Eosinophils Absolute: 0 10*3/uL (ref 0.0–0.5)
Eosinophils Relative: 0 %
HCT: 22.3 % — ABNORMAL LOW (ref 39.0–52.0)
Hemoglobin: 7.5 g/dL — ABNORMAL LOW (ref 13.0–17.0)
Immature Granulocytes: 1 %
Lymphocytes Relative: 6 %
Lymphs Abs: 1.3 10*3/uL (ref 0.7–4.0)
MCH: 30.9 pg (ref 26.0–34.0)
MCHC: 33.6 g/dL (ref 30.0–36.0)
MCV: 91.8 fL (ref 80.0–100.0)
Monocytes Absolute: 1.3 10*3/uL — ABNORMAL HIGH (ref 0.1–1.0)
Monocytes Relative: 6 %
Neutro Abs: 18.9 10*3/uL — ABNORMAL HIGH (ref 1.7–7.7)
Neutrophils Relative %: 87 %
Platelets: 196 10*3/uL (ref 150–400)
RBC: 2.43 MIL/uL — ABNORMAL LOW (ref 4.22–5.81)
RDW: 16.7 % — ABNORMAL HIGH (ref 11.5–15.5)
WBC: 21.5 10*3/uL — ABNORMAL HIGH (ref 4.0–10.5)
nRBC: 0.2 % (ref 0.0–0.2)

## 2021-03-31 LAB — GLUCOSE, CAPILLARY
Glucose-Capillary: 139 mg/dL — ABNORMAL HIGH (ref 70–99)
Glucose-Capillary: 139 mg/dL — ABNORMAL HIGH (ref 70–99)
Glucose-Capillary: 140 mg/dL — ABNORMAL HIGH (ref 70–99)
Glucose-Capillary: 148 mg/dL — ABNORMAL HIGH (ref 70–99)
Glucose-Capillary: 155 mg/dL — ABNORMAL HIGH (ref 70–99)
Glucose-Capillary: 158 mg/dL — ABNORMAL HIGH (ref 70–99)

## 2021-03-31 LAB — PREPARE RBC (CROSSMATCH)

## 2021-03-31 LAB — LACTIC ACID, PLASMA: Lactic Acid, Venous: 1.6 mmol/L (ref 0.5–1.9)

## 2021-03-31 LAB — HEMOGLOBIN A1C
Hgb A1c MFr Bld: 5.6 % (ref 4.8–5.6)
Mean Plasma Glucose: 114.02 mg/dL

## 2021-03-31 LAB — HIV ANTIBODY (ROUTINE TESTING W REFLEX): HIV Screen 4th Generation wRfx: NONREACTIVE

## 2021-03-31 LAB — HEPATITIS B SURFACE ANTIGEN: Hepatitis B Surface Ag: NONREACTIVE

## 2021-03-31 LAB — PROTIME-INR
INR: 1.4 — ABNORMAL HIGH (ref 0.8–1.2)
Prothrombin Time: 17.4 seconds — ABNORMAL HIGH (ref 11.4–15.2)

## 2021-03-31 LAB — APTT: aPTT: 34 seconds (ref 24–36)

## 2021-03-31 SURGERY — ESOPHAGOGASTRODUODENOSCOPY (EGD) WITH PROPOFOL
Anesthesia: Moderate Sedation

## 2021-03-31 SURGERY — ESOPHAGOGASTRODUODENOSCOPY (EGD) WITH PROPOFOL
Anesthesia: Monitor Anesthesia Care

## 2021-03-31 MED ORDER — CHLORHEXIDINE GLUCONATE 0.12% ORAL RINSE (MEDLINE KIT)
15.0000 mL | Freq: Two times a day (BID) | OROMUCOSAL | Status: DC
  Administered 2021-03-31 – 2021-04-01 (×4): 15 mL via OROMUCOSAL

## 2021-03-31 MED ORDER — PROPOFOL 1000 MG/100ML IV EMUL
5.0000 ug/kg/min | INTRAVENOUS | Status: DC
  Administered 2021-03-31 (×2): 38 ug/kg/min via INTRAVENOUS
  Administered 2021-03-31: 5 ug/kg/min via INTRAVENOUS
  Administered 2021-03-31: 40 ug/kg/min via INTRAVENOUS
  Administered 2021-03-31: 70 ug/kg/min via INTRAVENOUS
  Administered 2021-04-01: 38 ug/kg/min via INTRAVENOUS
  Filled 2021-03-31 (×9): qty 100

## 2021-03-31 MED ORDER — INSULIN ASPART 100 UNIT/ML IJ SOLN
0.0000 [IU] | INTRAMUSCULAR | Status: DC
  Administered 2021-03-31 (×2): 1 [IU] via SUBCUTANEOUS

## 2021-03-31 MED ORDER — ACETAMINOPHEN 650 MG RE SUPP: 650.0000 mg | RECTAL | Status: DC | PRN

## 2021-03-31 MED ORDER — SODIUM CHLORIDE 0.9% IV SOLUTION: Freq: Once | INTRAVENOUS | Status: AC

## 2021-03-31 MED ORDER — MIDAZOLAM HCL (PF) 5 MG/ML IJ SOLN
INTRAMUSCULAR | Status: AC
  Filled 2021-03-31: qty 2

## 2021-03-31 MED ORDER — CLOPIDOGREL BISULFATE 75 MG PO TABS
75.0000 mg | ORAL_TABLET | Freq: Every day | ORAL | Status: DC
  Administered 2021-03-31: 75 mg
  Filled 2021-03-31: qty 1

## 2021-03-31 MED ORDER — NOREPINEPHRINE 16 MG/250ML-% IV SOLN
0.0000 ug/min | INTRAVENOUS | Status: DC
  Administered 2021-03-31: 18 ug/min via INTRAVENOUS
  Administered 2021-03-31: 12 ug/min via INTRAVENOUS
  Administered 2021-04-01: 2 ug/min via INTRAVENOUS
  Filled 2021-03-31 (×2): qty 250

## 2021-03-31 MED ORDER — CALCIUM GLUCONATE-NACL 1-0.675 GM/50ML-% IV SOLN
1.0000 g | Freq: Once | INTRAVENOUS | Status: AC
  Administered 2021-03-31: 1000 mg via INTRAVENOUS
  Filled 2021-03-31: qty 50

## 2021-03-31 MED ORDER — ORAL CARE MOUTH RINSE
15.0000 mL | OROMUCOSAL | Status: DC
  Administered 2021-03-31 – 2021-04-01 (×11): 15 mL via OROMUCOSAL

## 2021-03-31 MED ORDER — LEVETIRACETAM IN NACL 500 MG/100ML IV SOLN
500.0000 mg | INTRAVENOUS | Status: DC
  Administered 2021-03-31 – 2021-04-01 (×2): 500 mg via INTRAVENOUS
  Filled 2021-03-31 (×2): qty 100

## 2021-03-31 MED ORDER — TACROLIMUS 1 MG/ML ORAL SUSPENSION
0.5000 mg | Freq: Every day | ORAL | Status: DC
  Filled 2021-03-31: qty 0.5

## 2021-03-31 MED ORDER — ASPIRIN EC 81 MG PO TBEC
81.0000 mg | DELAYED_RELEASE_TABLET | Freq: Every day | ORAL | Status: DC
  Administered 2021-03-31: 81 mg via ORAL
  Filled 2021-03-31: qty 1

## 2021-03-31 MED ORDER — ACETAMINOPHEN 325 MG PO TABS
650.0000 mg | ORAL_TABLET | Freq: Four times a day (QID) | ORAL | Status: DC | PRN
  Administered 2021-04-03 – 2021-04-07 (×8): 650 mg via ORAL
  Filled 2021-03-31 (×8): qty 2

## 2021-03-31 MED ORDER — FENTANYL CITRATE (PF) 100 MCG/2ML IJ SOLN
INTRAMUSCULAR | Status: AC
  Filled 2021-03-31: qty 4

## 2021-03-31 MED ORDER — SODIUM CHLORIDE 0.9 % IV SOLN
INTRAVENOUS | Status: DC | PRN
  Administered 2021-03-31: 1000 mL via INTRAVENOUS

## 2021-03-31 MED ORDER — FENTANYL 2500MCG IN NS 250ML (10MCG/ML) PREMIX INFUSION
0.0000 ug/h | INTRAVENOUS | Status: DC
Start: 2021-03-31 — End: 2021-04-01
  Administered 2021-03-31: 100 ug/h via INTRAVENOUS
  Administered 2021-03-31: 25 ug/h via INTRAVENOUS
  Filled 2021-03-31 (×2): qty 250

## 2021-03-31 SURGICAL SUPPLY — 14 items

## 2021-03-31 NOTE — Progress Notes (Signed)
Patient just arrived to 2M14. RT placed patient on ventilator at this time. No complications noted.

## 2021-03-31 NOTE — Progress Notes (Signed)
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  Hemostatic spray applied. High risk for rebleeding  in the setting of ongoing Eliquis use. Recommendation NPO, serial CBC/ HGB/ HCT with transfusions as necessary. Hold Eliquis  However, unable to hold Eliquis at this  time. With additional bleeding, would recommend IR  for embolization over repeat EGD given the limit  therapeutic endoscopic options in the setting of  ongoing Eliquis use.  Will add CBC this evening NPO as recommended  Ionized Calcium 0.98>>  2 grams of Ca gluconate given  Will recheck in am 5/18  EEG suggestive of severe diffuse encephalopathy, nonspecific etiology but likely related to sedation. No seizures or epileptiform discharges seen. Will continue Keppra dosing IV  Per renal will determine need for CRRT vs hemodialysis 5/18. Pt. Remains on 12 mcg/min levophed  Magdalen Spatz, MSN, AGACNP-BC [place] for personal pager PCCM on call pager [phone]

## 2021-03-31 NOTE — Progress Notes (Addendum)
NAME:  [name], MRN:  [id number], DOB:  Apr 26, 1966, LOS: 1 ADMISSION DATE:  03/30/2021, CONSULTATION DATE:  03/31/21 REFERRING MD: [facility] Hospitalist , CHIEF COMPLAINT:  UGIB   History of Present Illness:  [name] is a 55 y.o. M with PMH of Atrial Fibrillation on Eliquis, ESRD s/p renal transplant on dialysis and chronic prednisone and prograf, CAD s/p recent stent, HTN, HFpEF with EF 60-65% who presented to [facility] ED with a chief complaint of syncope.  Per records, pt was about to leave for dialysis when he passed out and woke up on the ground.  Work-up in the ED revealed Hgb of 4.6 and hypotension.  He was given IVF, PPI and transfusion and admitted to the critical care unit.  After admission, pt had several episodes of hematemesis and melena and then new onset seizure activity.  Stat head CT was negative.  Pt intubated and transferred to [facility] for emergent EGD  Pertinent  Medical History   has a past medical history of Atrial fibrillation ([place]), CHF (congestive heart failure) ([place]), Chronic kidney disease, Coronary artery disease, Dysrhythmia, H/O heart artery stent, HLD (hyperlipidemia), Hypertension, Kidney transplanted, Pneumonia, and Sleep apnea.   Significant Hospital Events: Including procedures, antibiotic start and stop dates in addition to other pertinent events   . 5/16 Admit to [facility], episodes of hematemesis and seizure, transfer to [facility].  Transfused 3 units PRBC's  Tubes/lines/drains ETT 5/16- CVC 5/16-   Antibiotics Zosyn 5/16-     Interim History / Subjective:  Pt. Remains on Levophed at 13 mcg/min, Fentanyl at 100 mch/hr, Propofol at 34 mcg/kg/min, Protonix gtt at 8 mg/hr. HGB drop to  5.9 overnight >> receiving an additional 2 units of PRBC's now Ionized Calcium 1.06, receiving 2 grams Ca gluconate Net + 4426, anuric, OG output 1437 since admission  Last Dose Eliquis 8 am 5/16>> No reversal Last HD 5/16 at [facility] >> No volume pulled, just  filtered Creatinine 8.37, BUN 94 WBC 21.6, platelets 183  INR is 1.4  Vent 30%/16/5/640  Objective   Blood pressure 126/65, pulse 69, temperature 98.3 F (36.8 C), resp. rate 17, height '6\' 1"'$  (1.854 m), weight 101.9 kg, SpO2 100 %.    Vent Mode: PRVC FiO2 (%):  [30 %-50 %] 30 % Set Rate:  [16 bmp] 16 bmp Vt Set:  [640 mL] 640 mL PEEP:  [5 cmH20] 5 cmH20 Plateau Pressure:  [17 cmH20] 17 cmH20   Intake/Output Summary (Last 24 hours) at 03/31/2021 0842 Last data filed at 03/31/2021 0630 Gross per 24 hour  Intake 2989.06 ml  Output -1437 ml  Net 4426.06 ml   Filed Weights   03/30/21 1343 03/30/21 1450 03/31/21 0336  Weight: 99.8 kg 99.8 kg 101.9 kg   General:  Sedated and intubated male, in NAD HEENT: NCAT, MM pink/moist, ETT secure and  in place, no current hematemesis, sclera anicteric, No LAD Neuro: examined on 43 mcg propofol, fentanyl of 100 mcg, RASS -3, no purposeful movement , no following commands CV: s1s2 regular, SR, no m/r/g PULM:  Bilateral chest excursion, Few rhonchi bilaterally, diminished per bases GI: soft, ND, bs x 4 active  Extremities: No obvious deformities, warm/dry, 1+ generalized edema  Skin: no rashes or lesions, warm dry and intact  Labs/imaging that I havepersonally reviewed  (right click and "Reselect all SmartList Selections" daily)  CBC BMP CXR>> Stable probable posttraumatic right basilar pleural and parenchymal scarring with resultant right-sided volume loss.  Resolved Hospital Problem list  Assessment & Plan:   Hemorrhagic Shock secondary to UGIB Transfused 3 units and  Hgb rose from 4.6 to 7.4 Transferred to [facility] for EGD HGB 5.9 on 5/16 P: - Transfuse 2 additional units PRBC - Repeat CBC pending transfuse for Hgb <7 - HGB goal > 8 as recent Stent placement - EGD planned for 5/17 ( today)  - continue protonix gtt - almost 24hrs since admission and no overt hematemesis,but continued drop in HGB overnight. -?  indication for  Andexxa  - Ca gluconate 2 grams 5/17 am - Trend ionized calcium - continue Levophed to maintain MAP >65 - started on stress dose steroids in the setting of chronic steroids     Seizure Activity New onset seizure like activity in the setting of shock P: - head CT without acute findings  - no hypoglycemia, or major electrolyte abnormalities -EEG>> pending - seizure precautions - on propfol and Keppra - Plan for MRI brain 5/17 - Will consult Neuro   post-ictal acute respiratory failure/ inability to protect airway Plan Wean Fio2 and PEEP as able CXR prn ABG prn Full support 5/17  ( EGD and Seizure suppression) Saturation Goal 88-94%    CAD s/p stent, HTN, atrial fibrillation, HFpEF,  Stent less than one week ago Rate controlled 5/17 am P: -holding Asa and Eliquis -monitor  -will likely need cards consult regarding resumption of anti-platelet and anti-coagulation - Cards consulted 5/17 am - continue amiodarone   ESRD w/ hx of renal transplant  M/W/F diaylsis Last HD 5/16 P: -seen by nephrology at [facility] 5/16 -has L AVF -continue prograf>>  Will check level     Best practice (right click and "Reselect all SmartList Selections" daily)  Diet:  NPO Pain/Anxiety/Delirium protocol (if indicated): Yes (RASS goal -1) VAP protocol (if indicated): Yes DVT prophylaxis: Contraindicated GI prophylaxis: PPI Glucose control:  SSI Yes Central venous access:  Yes, and it is still needed Arterial line:  N/A Foley:  Yes, and it is still needed Mobility:  bed rest  PT consulted: N/A Last date of multidisciplinary goals of care discussion [pending] Code Status:  full code Disposition: ICU  Labs   CBC: Recent Labs  Lab 03/30/21 0737 03/30/21 1851 03/31/21 0021 03/31/21 0434  WBC 13.6* 25.7* 24.6* 21.6*  NEUTROABS 9.8* 24.0*  --   --   HGB 4.6* 6.3* 7.4* 5.9*  HCT 15.3* 19.2* 22.4* 17.9*  MCV 101.3* 96.0 94.1 93.7  PLT 183 184 203 [id number]    Basic Metabolic  Panel: Recent Labs  Lab 03/30/21 0737 03/31/21 0434  NA 137 135  K 4.2 4.4  CL 100 97*  CO2 21* 25  GLUCOSE 107* 168*  BUN 182* 94*  CREATININE 13.53* 8.37*  CALCIUM 7.1* 6.9*  MG 1.9  --    GFR: Estimated Creatinine Clearance: 13 mL/min (A) (by C-G formula based on SCr of 8.37 mg/dL (H)). Recent Labs  Lab 03/30/21 0737 03/30/21 0833 03/30/21 1038 03/30/21 1851 03/31/21 0021 03/31/21 0434  WBC 13.6*  --   --  25.7* 24.6* 21.6*  LATICACIDVEN  --  3.4* 3.3*  --   --  1.6    Liver Function Tests: No results for input(s): AST, ALT, ALKPHOS, BILITOT, PROT, ALBUMIN in the last 168 hours. No results for input(s): LIPASE, AMYLASE in the last 168 hours. No results for input(s): AMMONIA in the last 168 hours.  ABG    Component Value Date/Time   PHART 7.406 03/31/2021 0140   PCO2ART 39.6 03/31/2021 0140  PO2ART 36.2 (LL) 03/31/2021 0140   HCO3 24.3 03/31/2021 0140   TCO2 28 12/09/2020 0945   O2SAT 62.7 03/31/2021 0140     Coagulation Profile: Recent Labs  Lab 03/31/21 0434  INR 1.4*    Cardiac Enzymes: No results for input(s): CKTOTAL, CKMB, CKMBINDEX, TROPONINI in the last 168 hours.  HbA1C: Hgb A1c MFr Bld  Date/Time Value Ref Range Status  03/31/2021 05:38 AM 5.6 4.8 - 5.6 % Final    Comment:    (NOTE) Pre diabetes:          5.7%-6.4%  Diabetes:              >6.4%  Glycemic control for   <7.0% adults with diabetes     CBG: Recent Labs  Lab 03/30/21 1824 03/31/21 0324 03/31/21 0808  GLUCAP 129* 148* 155*        Critical care time: 45 minutes       CRITICAL CARE Performed by: Magdalen Spatz   Total critical care time: 45 minutes  Critical care time was exclusive of separately billable procedures and treating other patients.  Critical care was necessary to treat or prevent imminent or life-threatening deterioration.  Critical care was time spent personally by me on the following activities: development of treatment plan with  patient and/or surrogate as well as nursing, discussions with consultants, evaluation of patient's response to treatment, examination of patient, obtaining history from patient or surrogate, ordering and performing treatments and interventions, ordering and review of laboratory studies, ordering and review of radiographic studies, pulse oximetry and re-evaluation of patient's condition.   Howard Pouch, AGACNP-BC [facility] See Amion for pager If no response to pager , please call [phone] until 7pm>> Hospital Use only, no outpatient use. After 7:00 pm call Elink  [phone] 03/31/2021

## 2021-03-31 NOTE — Progress Notes (Signed)
[facility] Progress Note Patient Name: [name] DOB: 1966/08/05 MRN: [id number]   Date of Service  03/31/2021  HPI/Events of Note  Daytime PCCM requested follow up of  Hemoglobin and brain MRI results. Hemoglobin 7.6 gm / dl, MRI brain unremarkable.  eICU Interventions  No intervention indicated.        [name] Malichi Palardy 03/31/2021, 10:28 PM

## 2021-03-31 NOTE — Progress Notes (Signed)
Anesthesiologist arrived to floor to intubate pt. '20mg'$  of Etomidate given at 2349. '60mg'$  Rocuronium given at 2350. Pt was intubated with a 7.48m ETT with positive color change at 2352. ETT measures at 26 at the lip. 99F OG was placed at 0001. Chest xray performed to confirm placement and still awaiting reading.

## 2021-03-31 NOTE — Op Note (Addendum)
[facility] Patient Name: [name] Procedure Date : 03/31/2021 MRN: [id number] Attending MD: Thornton Park MD, MD Date of Birth: [birth year] CSN: [id number] Age: 55 Admit Type: Inpatient Procedure:                Upper GI endoscopy Indications:              Suspected upper gastrointestinal bleeding, Cardiac                            cath with stent placement at OSH 03/26/21 Providers:                Thornton Park MD, MD, Dulcy Fanny, Lesia Sago, Technician Referring MD:              Medicines:                None Complications:            No immediate complications. Estimated blood loss:                            Minimal. Estimated Blood Loss:     Estimated blood loss: none. Procedure:                Pre-Anesthesia Assessment:                           - Prior to the procedure, a History and Physical                            was performed, and patient medications and                            allergies were reviewed. The patient's tolerance of                            previous anesthesia was also reviewed. The risks                            and benefits of the procedure and the sedation                            options and risks were discussed with the patient.                            All questions were answered, and informed consent                            was obtained. Prior Anticoagulants: The patient has                            taken Effient (prasugrel) and Eliquis, last dose                            was 1 day  prior to procedure. ASA Grade Assessment:                            III - A patient with severe systemic disease. After                            reviewing the risks and benefits, the patient was                            deemed in satisfactory condition to undergo the                            procedure.                           After obtaining informed consent, the endoscope was                             passed under direct vision. Throughout the                            procedure, the patient's blood pressure, pulse, and                            oxygen saturations were monitored continuously. The                            [id number] Olympus therapeutic                            gastroscope was introduced through the mouth, and                            advanced to the fourth part of duodenum. The upper                            GI endoscopy was performed with moderate difficulty                            due to excessive bleeding. The patient tolerated                            the procedure well. Scope In: Scope Out: Findings:      The esophagus was normal. No blood present.      The stomach was normal. No blood present.      Localized mildly erythematous mucosa without active bleeding and with no       stigmata of bleeding was found in the second portion of the duodenum. No       blood present.      Scope trauma occurred every time the scope came in contact with the       mucosa.      Active bleeding was located in the fourth portion of the duodenum, 57M       from incisors and just beyond the furthest reach of the  gastroscope.       Suspected AVM although the mucosa was not well seen due to ongoing       bleeding. To stop active bleeding, hemostatic spray was deployed.       Multiple sprays were applied. There was no bleeding at the end of the       procedure. No blood was seen in the small bowel proximal to this lesion. Impression:               - Erythematous duodenopathy.                           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 Hemostatic spray applied. High                            risk for rebleeding in the setting of ongoing                            Eliquis and Effient use. Recommendation:           - NPO. Avoid given oral meds to allow maximum                             effect of the hemospray.                           - Serial hgb/hct with transfuions as necessary.                           - Discussed with Dr. Halford Chessman. Cardiology consultation                            pending. However, unable to hold Effient and/or                            Eliquis at this time. With additional bleeding,                            would recommend IR for embolization over repeat EGD                            given the limited therapeutic endoscopic options in                            the setting of ongoing Effient/Eliquis use.                           Findings and recommendations were discussed with                            the patient's wife at the bedside after the  procedure. All questions were answered to her                            satisfaction. Procedure Code(s):        --- Professional ---                           [phone], Esophagogastroduodenoscopy, flexible,                            transoral; with control of bleeding, any method Diagnosis Code(s):        --- Professional ---                           K31.89, Other diseases of stomach and duodenum                           K31.811, Angiodysplasia of stomach and duodenum                            with bleeding CPT copyright 2019 [employer]. All rights reserved. The codes documented in this report are preliminary and upon coder review may  be revised to meet current compliance requirements. Thornton Park MD, MD 03/31/2021 2:47:33 PM This report has been signed electronically. Number of Addenda: 0
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
## 2021-03-31 NOTE — Procedures (Signed)
Patient Name: [name]  MRN: [id number]  Epilepsy Attending: Lora Havens  Referring Physician/Provider: Dr Jenetta Downer Date: 03/31/2021 Duration: 22.25 mins  Patient history: 55 yo M with new onset seizure like activity in the setting of shock. EEG too evaluate for seizure  Level of alertness:  comatose  AEDs during EEG study: LEV, Propofol  Technical aspects: This EEG study was done with scalp electrodes positioned according to the 10-20 International system of electrode placement. Electrical activity was acquired at a sampling rate of '500Hz'$  and reviewed with a high frequency filter of '70Hz'$  and a low frequency filter of '1Hz'$ . EEG data were recorded continuously and digitally stored.   Description: EEG showed continuous generalized 2 to 3 Hz delta slowing with overriding 15 to 18 Hz beta activity distributed symmetrically and diffusely.  Hyperventilation and photic stimulation were not performed.     ABNORMALITY - Continuous slow, generalized - Excessive beta, generalized  IMPRESSION: This study is suggestive of severe diffuse encephalopathy, nonspecific etiology but likely related to sedation. No seizures or epileptiform discharges were seen throughout the recording.  Brennden Masten Barbra Sarks

## 2021-03-31 NOTE — Consult Note (Signed)
[place] Nurse Consult Note: Patient receiving care in [facility] [phone] Reason for Consult: RLE wound Wound type: Full thickness wound on the right calf Pressure Injury POA: NA Measurement: 5 cm x 2 cm x 0.2 cm Wound bed: 100% yellow with pink borders Drainage (amount, consistency, odor)  Serosanguinous on the foam dressing.  Periwound: Erythmatous Dressing procedure/placement/frequency: Clean the right calf wound with NS, dry and apply Aquacel Advantage Kellie Simmering # [phone]) over the wound and secure with foam dressing. Change daily.   Monitor the wound area(s) for worsening of condition such as: Signs/symptoms of infection, increase in size, development of or worsening of odor, development of pain, or increased pain at the affected locations.   Notify the medical team if any of these develop.  Thank you for the consult. [place] nurse will not follow at this time.   Please re-consult the [place] team if needed.  Cathlean Marseilles Tamala Julian, MSN, RN, [place], Lysle Pearl, [facility] Wound Treatment Associate Pager [phone]
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
## 2021-03-31 NOTE — Progress Notes (Signed)
eLink Physician-Brief Progress Note Patient Name: [name] DOB: 1966/07/08 MRN: [id number]   Date of Service  03/31/2021  HPI/Events of Note  Complicated patient with Afib RVR on Eliquis, ESRD s/p past kidney transplant but now back on chronic dialysis, who developed syncope at outside hospital and was diagnosed with upper GI bleeding with severe acute blood loss anemia, he seized at OSH and was intubated for airway protection, he was transferred to [facility] for EGD and further Rx.  eICU Interventions  New Patient Evaluation.        [name] [name] 03/31/2021, 3:41 AM

## 2021-03-31 NOTE — Progress Notes (Signed)
EEG complete - results pending 

## 2021-03-31 NOTE — Progress Notes (Signed)
Nephrology Follow-Up Consult note   Assessment/Recommendations: [name] is a/an 55 y.o. male with a past medical history significant for HTN, HLD, CAD, CHF, afib, OSA, renal tx now back on HD, admitted for GI bleed.  [facility] [phone] MWF   3-1/2 hours EDW 97. HD [place] 2/2.5, Dialyzer 180, Heparin no. Access left aVF.  400 blood flow with 500 DFR-temperature 36.5 Epogen 1400 3 times weekly, Venofer 100 weekly.  Sodium thiosulfate 25 g 3 times a week   1 GI bleed-in the setting of multiple oral anticoagulants.    Now associated with hemorrhagic shock requiring pressors.  Plan for EGD today at bedside with GI. 2 Shock: Likely hemorrhagic/distributive with possible sepsis.  Management of GI bleed as above.  Continue pressors.  Would consider CRRT if the patient remains hypotensive throughout the day.  Currently no indication for dialysis 3 ESRD: Normally Monday Wednesday Friday at [facility] via aVF.    Patient had partial session of dialysis yesterday.  Spoke okay today.  Will address need for CRRT versus hemodialysis tomorrow 4 hypotension: Chronic issue previously tried midodrine.  Currently with shock as above. 5. Anemia of ESRD:  Currently with GI bleed.  Transfusions as above 6. 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    Has been on sodium thiosulfate.  Holding for now given his acute illness.  Can restart once he is more stable. 7. Hypocalcemia: associated with transfusions. Supplement as needed   Recommendations conveyed to primary service.    [facility] 03/31/2021 9:36 AM  ___________________________________________________________  CC: ESRD, GI bleed  Interval History/Subjective: Patient was admitted yesterday.  He was unstable at [facility] and had a seizure during dialysis.  He did not receive full session.  The patient has had persistent anemia requiring multiple blood transfusions.  He was  intubated for airway protection.  He has been hemodynamically unstable and required pressors.   Medications:  Current Facility-Administered Medications  Medication Dose Route Frequency Provider Last Rate Last Admin  . 0.9 %  sodium chloride infusion  100 mL Intravenous PRN Corliss Parish, MD      . 0.9 %  sodium chloride infusion  100 mL Intravenous PRN Corliss Parish, MD      . 0.9 %  sodium chloride infusion  250 mL Intravenous Continuous Barton Dubois, MD      . 0.9 %  sodium chloride infusion  250 mL Intravenous PRN Barton Dubois, MD      . 0.9 %  sodium chloride infusion   Intravenous PRN Kipp Brood, MD 10 mL/hr at 03/31/21 0600 Infusion Verify at 03/31/21 0600  . acetaminophen (TYLENOL) tablet 1,000 mg  1,000 mg Oral Q6H PRN Barton Dubois, MD      . amiodarone (PACERONE) tablet 200 mg  200 mg Oral Daily Barton Dubois, MD      . chlorhexidine gluconate (MEDLINE KIT) (PERIDEX) 0.12 % solution 15 mL  15 mL Mouth Rinse BID Kipp Brood, MD   15 mL at 03/31/21 0809  . Chlorhexidine Gluconate Cloth 2 % PADS 6 each  6 each Topical Q0600 Corliss Parish, MD   6 each at 03/31/21 0325  . Darbepoetin Alfa (ARANESP) injection 200 mcg  200 mcg Intravenous Q Mon-HD Corliss Parish, MD   200 mcg at 03/30/21 1642  . febuxostat (ULORIC) tablet 40 mg  40 mg Oral Daily Barton Dubois, MD      . fentaNYL 2512mg in NS 2519m(1033mml) infusion-PREMIX  0-400 mcg/hr Intravenous Continuous Zierle-Ghosh, Asia B, DO 10 mL/hr at 03/31/21 0600 100 mcg/hr at 03/31/21 0600  . hydrocortisone sodium succinate (SOLU-CORTEF) 100 MG injection 100 mg  100 mg Intravenous Q8H Barton Dubois, MD   100 mg at 03/31/21 0335  . insulin aspart (novoLOG) injection 0-6 Units  0-6 Units Subcutaneous Q4H Gleason, Otilio Carpen, PA-C   1 Units at 03/31/21 4944  . lidocaine (PF) (XYLOCAINE) 1 % injection 5 mL  5 mL Intradermal PRN Corliss Parish, MD      . lidocaine-prilocaine (EMLA) cream 1 application  1  application Topical PRN Corliss Parish, MD      . LORazepam (ATIVAN) injection 2 mg  2 mg Intravenous Q5 min PRN Emokpae, Ejiroghene E, MD      . MEDLINE mouth rinse  15 mL Mouth Rinse 10 times per day Kipp Brood, MD      . multivitamin with minerals tablet 1 tablet  1 tablet Oral Daily Barton Dubois, MD      . mupirocin ointment (BACTROBAN) 2 % 1 application  1 application Nasal BID Barton Dubois, MD      . norepinephrine (LEVOPHED) 16 mg in 253m premix infusion  0-40 mcg/min Intravenous Titrated MBarton Dubois MD 16.88 mL/hr at 03/31/21 0600 18 mcg/min at 03/31/21 0600  . ondansetron (ZOFRAN) tablet 4 mg  4 mg Oral Q6H PRN MBarton Dubois MD       Or  . ondansetron ([facility] injection 4 mg  4 mg Intravenous Q6H PRN MBarton Dubois MD      . pantoprazole (PROTONIX) 80 mg in sodium chloride 0.9 % 100 mL (0.8 mg/mL) infusion  8 mg/hr Intravenous Continuous HLuna Fuse MD 10 mL/hr at 03/31/21 0600 8 mg/hr at 03/31/21 0600  . pentafluoroprop-tetrafluoroeth (GEBAUERS) aerosol 1 application  1 application Topical PRN GCorliss Parish MD      . propofol (DIPRIVAN) 1000 MG/100ML infusion  5-80 mcg/kg/min Intravenous Titrated Zierle-Ghosh, Asia B, DO 24 mL/hr at 03/31/21 0606 40 mcg/kg/min at 03/31/21 0606  . sodium chloride flush (NS) 0.9 % injection 10-40 mL  10-40 mL Intracatheter Q12H JAviva Signs MD   10 mL at 03/30/21 2114  . sodium chloride flush (NS) 0.9 % injection 10-40 mL  10-40 mL Intracatheter PRN JAviva Signs MD      . sodium chloride flush (NS) 0.9 % injection 3 mL  3 mL Intravenous Q12H MBarton Dubois MD      . sodium chloride flush (NS) 0.9 % injection 3 mL  3 mL Intravenous PRN MBarton Dubois MD      . sodium thiosulfate 25 g in sodium chloride 0.9 % 200 mL Infusion for Calciphylaxis  25 g Intravenous Q M,W,F-HD GCorliss Parish MD 200 mL/hr at 03/30/21 1700 25 g at 03/30/21 1700  . tacrolimus (PROGRAF) capsule 0.5 mg  0.5 mg Oral Daily MBarton Dubois MD           Review of Systems: Unable to obtain due to the patient's sedation  Physical Exam: Vitals:   03/31/21 0905 03/31/21 0927  BP: (!) 117/59   Pulse: 65   Resp: 16 16  Temp: 98.3 F (36.8 C)   SpO2: 100% 100%   No intake/output data recorded.  Intake/Output Summary (Last 24 hours) at 03/31/2021 0936 Last data filed at 03/31/2021 0630 Gross per 24 hour  Intake 2989.06 ml  Output -1437 ml  Net 4426.06 ml   Constitutional: Cushingoid, obese, sedated ENMT: ears and nose without scars or lesions, MMM CV: normal rate,  no audible murmur Respiratory: Coarse bilateral breath sounds, bilateral chest rise, ventilated Gastrointestinal: soft, non-tender, no palpable masses or hernias Skin: no visible lesions or rashes Psych: Sedated, not interactive, does not answer to questioning   Test Results I personally reviewed new and old clinical labs and radiology tests Lab Results  Component Value Date   NA 135 03/31/2021   K 4.4 03/31/2021   CL 97 (L) 03/31/2021   CO2 25 03/31/2021   BUN 94 (H) 03/31/2021   CREATININE 8.37 (H) 03/31/2021   CALCIUM 6.9 (L) 03/31/2021   ALBUMIN 2.6 (L) 11/24/2016   PHOS 2.3 (L) 11/24/2016

## 2021-03-31 NOTE — Progress Notes (Signed)
[facility] Progress Note Patient Name: [name] DOB: January 17, 1966 MRN: [id number]   Date of Service  03/31/2021  HPI/Events of Note  Hemoglobin 5.9, ionized calcium 1.06.  eICU Interventions  Order entered to transfuse 2 units PRBC and infuse a total of 2 gm of Calcium gluconate.        [name] Troi Florendo 03/31/2021, 5:55 AM

## 2021-03-31 NOTE — Progress Notes (Signed)
Attempted ABG for post intubation protocol at [place]. Patient's blood pressure was very low and he is jerking and contracted on the right side. Unable to stick left side due to dialysis shunt and restricted extremity. Sample obtained was either venous or mixed. Attempted stick twice.   Patient was intubated for airway protection prior to transfer to [facility]. O2 sats on monitor have been correlating with heart rate and have great waveform. He has been 100% since intubation. MD Zierle-Ghosh notified and she is ok with results due to situation.

## 2021-03-31 NOTE — Progress Notes (Signed)
RT transported patient from 2M14 to MRI and back with RN. No complications. RT will continue to monitor.

## 2021-04-01 ENCOUNTER — Inpatient Hospital Stay (HOSPITAL_COMMUNITY): Payer: Medicare Other

## 2021-04-01 DIAGNOSIS — J988 Other specified respiratory disorders: Secondary | ICD-10-CM

## 2021-04-01 DIAGNOSIS — K922 Gastrointestinal hemorrhage, unspecified: Secondary | ICD-10-CM | POA: Diagnosis not present

## 2021-04-01 DIAGNOSIS — I480 Paroxysmal atrial fibrillation: Secondary | ICD-10-CM

## 2021-04-01 DIAGNOSIS — Z955 Presence of coronary angioplasty implant and graft: Secondary | ICD-10-CM | POA: Diagnosis not present

## 2021-04-01 DIAGNOSIS — R55 Syncope and collapse: Secondary | ICD-10-CM | POA: Diagnosis not present

## 2021-04-01 DIAGNOSIS — R578 Other shock: Secondary | ICD-10-CM

## 2021-04-01 DIAGNOSIS — K31811 Angiodysplasia of stomach and duodenum with bleeding: Secondary | ICD-10-CM | POA: Diagnosis not present

## 2021-04-01 DIAGNOSIS — D500 Iron deficiency anemia secondary to blood loss (chronic): Secondary | ICD-10-CM | POA: Diagnosis not present

## 2021-04-01 LAB — BASIC METABOLIC PANEL
Anion gap: 14 (ref 5–15)
Anion gap: 15 (ref 5–15)
BUN: 121 mg/dL — ABNORMAL HIGH (ref 6–20)
BUN: 131 mg/dL — ABNORMAL HIGH (ref 6–20)
CO2: 22 mmol/L (ref 22–32)
CO2: 22 mmol/L (ref 22–32)
Calcium: 6.8 mg/dL — ABNORMAL LOW (ref 8.9–10.3)
Calcium: 6.6 mg/dL — ABNORMAL LOW (ref 8.9–10.3)
Chloride: 95 mmol/L — ABNORMAL LOW (ref 98–111)
Chloride: 96 mmol/L — ABNORMAL LOW (ref 98–111)
Creatinine, Ser: 9.82 mg/dL — ABNORMAL HIGH (ref 0.61–1.24)
Creatinine, Ser: 10.36 mg/dL — ABNORMAL HIGH (ref 0.61–1.24)
GFR, Estimated: 6 mL/min — ABNORMAL LOW (ref >60)
GFR, Estimated: 5 mL/min — ABNORMAL LOW (ref >60)
Glucose, Bld: 141 mg/dL — ABNORMAL HIGH (ref 70–99)
Glucose, Bld: 101 mg/dL — ABNORMAL HIGH (ref 70–99)
Potassium: 5.3 mmol/L — ABNORMAL HIGH (ref 3.5–5.1)
Potassium: 4.9 mmol/L (ref 3.5–5.1)
Sodium: 131 mmol/L — ABNORMAL LOW (ref 135–145)
Sodium: 133 mmol/L — ABNORMAL LOW (ref 135–145)

## 2021-04-01 LAB — PREPARE RBC (CROSSMATCH)

## 2021-04-01 LAB — CBC
HCT: 21.8 % — ABNORMAL LOW (ref 39.0–52.0)
Hemoglobin: 7.2 g/dL — ABNORMAL LOW (ref 13.0–17.0)
MCH: 30.5 pg (ref 26.0–34.0)
MCHC: 33 g/dL (ref 30.0–36.0)
MCV: 92.4 fL (ref 80.0–100.0)
Platelets: 174 10*3/uL (ref 150–400)
RBC: 2.36 MIL/uL — ABNORMAL LOW (ref 4.22–5.81)
RDW: 17 % — ABNORMAL HIGH (ref 11.5–15.5)
WBC: 19.4 10*3/uL — ABNORMAL HIGH (ref 4.0–10.5)
nRBC: 0.2 % (ref 0.0–0.2)

## 2021-04-01 LAB — GLUCOSE, CAPILLARY
Glucose-Capillary: 101 mg/dL — ABNORMAL HIGH (ref 70–99)
Glucose-Capillary: 114 mg/dL — ABNORMAL HIGH (ref 70–99)
Glucose-Capillary: 117 mg/dL — ABNORMAL HIGH (ref 70–99)
Glucose-Capillary: 130 mg/dL — ABNORMAL HIGH (ref 70–99)
Glucose-Capillary: 140 mg/dL — ABNORMAL HIGH (ref 70–99)
Glucose-Capillary: 94 mg/dL (ref 70–99)

## 2021-04-01 LAB — LIPID PANEL
Cholesterol: 110 mg/dL (ref 0–200)
HDL: 37 mg/dL — ABNORMAL LOW (ref >40)
LDL Cholesterol: 50 mg/dL (ref 0–99)
Total CHOL/HDL Ratio: 3 RATIO
Triglycerides: 114 mg/dL (ref <150)
VLDL: 23 mg/dL (ref 0–40)

## 2021-04-01 LAB — MAGNESIUM: Magnesium: 2.1 mg/dL (ref 1.7–2.4)

## 2021-04-01 LAB — HEMOGLOBIN AND HEMATOCRIT, BLOOD
HCT: 20.7 % — ABNORMAL LOW (ref 39.0–52.0)
HCT: 20.5 % — ABNORMAL LOW (ref 39.0–52.0)
Hemoglobin: 6.8 g/dL — CL (ref 13.0–17.0)
Hemoglobin: 6.8 g/dL — CL (ref 13.0–17.0)

## 2021-04-01 LAB — TRIGLYCERIDES: Triglycerides: 159 mg/dL — ABNORMAL HIGH (ref <150)

## 2021-04-01 MED ORDER — SODIUM CHLORIDE 0.9% IV SOLUTION: Freq: Once | INTRAVENOUS | Status: AC

## 2021-04-01 MED ORDER — TACROLIMUS 1 MG/ML ORAL SUSPENSION
0.5000 mg | Freq: Every day | ORAL | Status: DC
  Administered 2021-04-01: 0.5 mg via ORAL
  Filled 2021-04-01: qty 0.5

## 2021-04-01 MED ORDER — ORAL CARE MOUTH RINSE
15.0000 mL | Freq: Two times a day (BID) | OROMUCOSAL | Status: DC
  Administered 2021-04-01: 15 mL via OROMUCOSAL

## 2021-04-01 MED ORDER — ASPIRIN 81 MG PO CHEW: 81.0000 mg | CHEWABLE_TABLET | Freq: Every day | ORAL | Status: DC

## 2021-04-01 MED ORDER — TACROLIMUS 0.5 MG PO CAPS
0.5000 mg | ORAL_CAPSULE | Freq: Every day | ORAL | Status: DC
  Administered 2021-04-02 – 2021-04-07 (×6): 0.5 mg via ORAL
  Filled 2021-04-01 (×6): qty 1

## 2021-04-01 MED ORDER — ATORVASTATIN CALCIUM 80 MG PO TABS
80.0000 mg | ORAL_TABLET | Freq: Every day | ORAL | Status: DC
  Administered 2021-04-01 – 2021-04-07 (×7): 80 mg via ORAL
  Filled 2021-04-01 (×3): qty 2
  Filled 2021-04-01 (×2): qty 1
  Filled 2021-04-01: qty 2
  Filled 2021-04-01: qty 1
  Filled 2021-04-01: qty 2

## 2021-04-01 MED ORDER — ASPIRIN 81 MG PO CHEW
81.0000 mg | CHEWABLE_TABLET | Freq: Every day | ORAL | Status: DC
  Administered 2021-04-01: 81 mg via ORAL
  Filled 2021-04-01: qty 1

## 2021-04-01 MED ORDER — CLOPIDOGREL BISULFATE 75 MG PO TABS
75.0000 mg | ORAL_TABLET | Freq: Every day | ORAL | Status: DC
  Administered 2021-04-01 – 2021-04-07 (×7): 75 mg via ORAL
  Filled 2021-04-01 (×7): qty 1

## 2021-04-01 MED ORDER — HYDROCORTISONE NA SUCCINATE PF 100 MG IJ SOLR
50.0000 mg | Freq: Two times a day (BID) | INTRAMUSCULAR | Status: DC
  Administered 2021-04-01 – 2021-04-02 (×2): 50 mg via INTRAVENOUS
  Filled 2021-04-01 (×2): qty 2

## 2021-04-01 MED ORDER — SODIUM CHLORIDE 0.9 % IV SOLN
250.0000 mg | Freq: Two times a day (BID) | INTRAVENOUS | Status: DC
  Administered 2021-04-02: 250 mg via INTRAVENOUS
  Filled 2021-04-01 (×2): qty 2.5

## 2021-04-01 MED ORDER — CHLORHEXIDINE GLUCONATE 0.12 % MT SOLN
OROMUCOSAL | Status: AC
  Filled 2021-04-01: qty 15

## 2021-04-01 MED ORDER — ASPIRIN EC 81 MG PO TBEC
81.0000 mg | DELAYED_RELEASE_TABLET | Freq: Every day | ORAL | Status: DC
  Administered 2021-04-02 – 2021-04-07 (×6): 81 mg via ORAL
  Filled 2021-04-01 (×6): qty 1

## 2021-04-01 MED ORDER — SODIUM ZIRCONIUM CYCLOSILICATE 10 G PO PACK
10.0000 g | PACK | Freq: Once | ORAL | Status: AC
  Administered 2021-04-01: 10 g via ORAL
  Filled 2021-04-01: qty 1

## 2021-04-01 MED ORDER — PANTOPRAZOLE SODIUM 40 MG PO TBEC
40.0000 mg | DELAYED_RELEASE_TABLET | Freq: Two times a day (BID) | ORAL | Status: DC
  Administered 2021-04-02 – 2021-04-07 (×10): 40 mg via ORAL
  Filled 2021-04-01 (×10): qty 1

## 2021-04-01 MED ORDER — MIDODRINE HCL 5 MG PO TABS: 5.0000 mg | ORAL_TABLET | Freq: Three times a day (TID) | ORAL | Status: DC

## 2021-04-01 NOTE — Procedures (Signed)
Extubation Procedure Note  Patient Details:   Name: [name] DOB: 06/28/1966 MRN: [id number]   Airway Documentation:    Vent end date: 04/01/21 Vent end time: 0820   Evaluation  O2 sats: stable throughout Complications: No apparent complications Patient did tolerate procedure well. Bilateral Breath Sounds: Clear,Diminished   Yes   Pt was extubated per CCM order and placed on 3 L [place]. Cuff leak was noted prior to extubation and no stridor post. Pt is stable at this time. RT will monitor.   Ronaldo Miyamoto 04/01/2021, 8:25 AM

## 2021-04-01 NOTE — Progress Notes (Signed)
NAME:  [name], MRN:  [id number], DOB:  [birth year], LOS: 2 ADMISSION DATE:  03/30/2021, CONSULTATION DATE:  04/01/21 REFERRING MD: Dr. Lamont Snowball, [facility] CHIEF COMPLAINT:  UGIB   History of Present Illness:  55 yo with with CAD s/p stent recently, A fib on eliquis presented to [facility] with syncope.  Found to have hematemesis with hemorrhagic shock and Hb 4.6.  Noted to also have seizure like activity.  Started on protonix infusion, required intubation for airway protection, and transferred to [facility] for further management.  Pertinent  Medical History  ESRD s/p renal transplant on prednisone and prograf, CAD s/p stent, A fib on eliquis, HTN, Diastolic CHF, HLD, OSA, Pneumonia  Significant Hospital Events:  . 5/16 Admit to [facility], episodes of hematemesis and seizure, [facility].  Transfused 3 units PRBC's . 5/17 EGD . 5/18 Extubate  Tests:   CT head 5/16 >> normal  MRI brain 5/17 >> unremarkable  EEG 5/17 >> continuous slowing, excess beta  EGD 5/17 >> single actively bleeding angioectasia in duodema  Interim History / Subjective:  Remains on pressors.  On pressure support.  Maroon stool overnight.  Objective   Blood pressure 133/69, pulse 60, temperature 97.6 F (36.4 C), temperature source Oral, resp. rate 12, height '6\' 1"'$  (1.854 m), weight 101.9 kg, SpO2 98 %.    Vent Mode: PSV;CPAP FiO2 (%):  [30 %] 30 % Set Rate:  [16 bmp] 16 bmp Vt Set:  [640 mL] 640 mL PEEP:  [5 cmH20] 5 cmH20 Pressure Support:  [8 cmH20] 8 cmH20 Plateau Pressure:  [8 cmH20-19 cmH20] 15 cmH20   Intake/Output Summary (Last 24 hours) at 04/01/2021 0820 Last data filed at 04/01/2021 0700 Gross per 24 hour  Intake 1832.64 ml  Output 0 ml  Net 1832.64 ml   Filed Weights   03/30/21 1343 03/30/21 1450 03/31/21 0336  Weight: 99.8 kg 99.8 kg 101.9 kg   Physical Exam:  General - alert Eyes - pupils reactive ENT - ETT in place Cardiac - irregular Chest - equal breath sounds b/l, no wheezing or  rales Abdomen - soft, non tender, + bowel sounds Extremities - no cyanosis, clubbing, or edema Skin - no rashes Neuro - moves extremities, follows commands  Resolved Hospital Problem list     Assessment & Plan:   Compromised airway in setting of Upper GI bleeding and seizure. - extubate 5/18 - goal SpO2 > 92%  Hemorrhagic shock in setting of Upper GI bleeding present on admission. 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 - continue protonix - GI following - if rebleeds, then might need embolization by IR  New onset seizure in setting of shock and cerebral hypoperfusion state. - neurology consulted - continue AEDs  ESRD s/p renal transplant. Hyperkalemia. - nephrology consulted - if pressor needs improve, then plan for HD - continue solucortef in place of prednisone for now - continue prograf  CAD s/p recent stenting. A fib present prior to admission. Chronic diastolic CHF, HTN, HLD. - cardiology consulted - continue enteric coated ASA 81 mg, plavix 75 mg - continue amiodarone, lipitor - hold eliquis   Best practice (right click and "Reselect all SmartList Selections" daily)  Diet: Sips with meds DVT prophylaxis: SCDs GI prophylaxis: Protonix Central venous access:  Yes, and it is still needed Mobility:  bed rest  Last date of multidisciplinary goals of care  discussion [pending] Code Status:  full code Disposition: ICU  Labs    CMP Latest Ref Rng & Units 04/01/2021 03/31/2021 03/31/2021  Glucose 70 - 99 mg/dL 141(H) - 168(H)  BUN 6 - 20 mg/dL 121(H) - 94(H)  Creatinine 0.61 - 1.24 mg/dL 9.82(H) - 8.37(H)  Sodium 135 - 145 mmol/L 131(L) 133(L) 135  Potassium 3.5 - 5.1 mmol/L 5.3(H) 4.9 4.4  Chloride 98 - 111 mmol/L 95(L) - 97(L)  CO2 22 - 32 mmol/L 22 - 25  Calcium 8.9 - 10.3 mg/dL 6.8(L) - 6.9(L)  Total Protein 6.5 - 8.1 g/dL - - -  Total  Bilirubin 0.3 - 1.2 mg/dL - - -  Alkaline Phos 38 - 126 U/L - - -  AST 15 - 41 U/L - - -  ALT 17 - 63 U/L - - -    CBC Latest Ref Rng & Units 04/01/2021 03/31/2021 03/31/2021  WBC 4.0 - 10.5 K/uL 19.4(H) 21.5(H) 19.0(H)  Hemoglobin 13.0 - 17.0 g/dL 7.2(L) 7.5(L) 7.6(L)  Hematocrit 39.0 - 52.0 % 21.8(L) 22.3(L) 22.5(L)  Platelets 150 - 400 K/uL 174 196 171    ABG    Component Value Date/Time   PHART 7.459 (H) 03/31/2021 0953   PCO2ART 37.3 03/31/2021 0953   PO2ART 132 (H) 03/31/2021 0953   HCO3 26.5 03/31/2021 0953   TCO2 28 03/31/2021 0953   O2SAT 99.0 03/31/2021 0953    CBG (last 3)  Recent Labs    03/31/21 2353 04/01/21 0340 04/01/21 0728  GLUCAP 140* 130* 140*    Critical care time: 34 minutes  Chesley Mires, MD [facility] Pager - [phone] 04/01/2021, 8:38 AM

## 2021-04-01 NOTE — Progress Notes (Signed)
eLink Physician-Brief Progress Note Patient Name: [name] DOB: 1966-05-18 MRN: [id number]   Date of Service  04/01/2021  HPI/Events of Note  Hemoglobin 6.8 gm / dl  eICU Interventions  Order entered to transfuse one unit PRBC.        Frederik Pear 04/01/2021, 9:29 PM

## 2021-04-01 NOTE — Treatment Plan (Signed)
Brief documentation note  Family refusing midodrine because they say it is because low blood pressures in the past.  This is likely confusion because it is a treatment for hypotension and does not cause it.  Despite explaining this patient and family refuse medication.  May prolong hospitalization and pressor requirement making renal replacement therapy difficult.  However, we will stop the medication due to the family's request despite our concerns.
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
## 2021-04-01 NOTE — Plan of Care (Signed)

## 2021-04-01 NOTE — Progress Notes (Addendum)
Neurology Progress Note  S: Patient was extubated this a.m. at 0820 and is awake and talking. He continue on Levophed, but per RN, this is being weaned. No longer on sedation.   -patient has undergone EGD due to GIB and hemostasis was used. His Hgb has risen to 7.2 (4.9). Eliquis remains on hold.  -Nephrology saw today with NP present. HD will be held today due to low BP and patient being on pressors.  -Cardiology present with NP today. Patient is s/p cardiac cath with PCI 03/26/21. His Prasugrel will be switched to Clopidogrel given improved bleeding profile. Plan to check lipids and start statin if needed.  -Wife reports a similar episode of "syncope" before with fluctuations in blood pressure and dialysis related changes.  O: Current vital signs: BP (!) 115/94   Pulse 61   Temp 97.6 F (36.4 C) (Oral)   Resp 10   Ht _0  (1.854 m)   Wt 101.9 kg   SpO2 100%   BMI 29.64 kg/m  Vital signs in last 24 hours: Temp:  [97.6 F (36.4 C)-98.9 F (37.2 C)] 97.6 F (36.4 C) (05/18 0729) Pulse Rate:  [55-91] 61 (05/18 0900) Resp:  [0-24] 10 (05/18 0900) BP: (82-134)/(44-98) 115/94 (05/18 0900) SpO2:  [95 %-100 %] 100 % (05/18 0900) FiO2 (%):  [30 %] 30 % (05/18 0813)  GENERAL: Awake, alert in NAD HEENT: Normocephalic and atraumatic LUNGS: Normal respiratory effort.  CV: RRR on tele.  ABDOMEN: Soft, nontender Ext: warm   NEURO:  Mental Status: AA&O except to day and date. Follows commands. Speech/Language: speech is without aphasia or dysarthria.  Naming, repetition, fluency, and comprehension intact.  Cranial Nerves:  II: PERRL. Visual fields full.  III, IV, VI: Patient crosses midline with EOM, but will not look all the way laterally with OS. Eyelids elevate symmetrically.  V: Sensation is intact to light touch and symmetrical to face.  VII: Smile is symmetrical.  VIII: hearing intact to voice. IX, X: Palate elevates symmetrically. Phonation is normal.  FF:[id number] shrug  5/5. XII: tongue is midline without fasciculations. Motor: 5/5 strength to all muscle groups tested. Difficulty raising BLEs off bed, but able to without hitting bed, but there is a drift.  Tone: is normal and bulk is normal Sensation- Intact to light touch bilaterally. Extinction absent to light touch to DSS.    Coordination: FTN intact bilaterally.  DTRs: 1+ brachioradialis and biceps. Patella 0.  Gait- deferred  Medications  Current Facility-Administered Medications:  .  0.9 %  sodium chloride infusion, 100 mL, Intravenous, PRN, Corliss Parish, MD .  0.9 %  sodium chloride infusion, 100 mL, Intravenous, PRN, Corliss Parish, MD .  0.9 %  sodium chloride infusion, 250 mL, Intravenous, Continuous, Barton Dubois, MD .  0.9 %  sodium chloride infusion, 250 mL, Intravenous, PRN, Barton Dubois, MD .  0.9 %  sodium chloride infusion, , Intravenous, PRN, Kipp Brood, MD, Last Rate: 10 mL/hr at 03/31/21 1300, Infusion Verify at 03/31/21 1300 .  acetaminophen (TYLENOL) tablet 650 mg, 650 mg, Oral, Q6H PRN **OR** acetaminophen (TYLENOL) suppository 650 mg, 650 mg, Rectal, Q4H PRN, Chesley Mires, MD .  amiodarone (PACERONE) tablet 200 mg, 200 mg, Oral, Daily, Barton Dubois, MD .  aspirin chewable tablet 81 mg, 81 mg, Oral, Daily, Sood, Vineet, MD .  atorvastatin (LIPITOR) tablet 80 mg, 80 mg, Oral, Daily, Sood, Vineet, MD .  chlorhexidine gluconate (MEDLINE KIT) (PERIDEX) 0.12 % solution 15 mL, 15 mL, Mouth Rinse, BID, Agarwala,  Einar Grad, MD, 15 mL at 04/01/21 0801 .  Chlorhexidine Gluconate Cloth 2 % PADS 6 each, 6 each, Topical, Q0600, Corliss Parish, MD, 6 each at 03/31/21 0325 .  clopidogrel (PLAVIX) tablet 75 mg, 75 mg, Oral, Daily, Sood, Vineet, MD .  Darbepoetin Alfa (ARANESP) injection 200 mcg, 200 mcg, Intravenous, Q Mon-HD, Corliss Parish, MD, 200 mcg at 03/30/21 1642 .  febuxostat (ULORIC) tablet 40 mg, 40 mg, Oral, Daily, Barton Dubois, MD .  hydrocortisone  sodium succinate (SOLU-CORTEF) 100 MG injection 50 mg, 50 mg, Intravenous, Q12H, Sood, Vineet, MD .  insulin aspart (novoLOG) injection 0-6 Units, 0-6 Units, Subcutaneous, Q4H, Gleason, Otilio Carpen, PA-C, 1 Units at 03/31/21 1736 .  levETIRAcetam (KEPPRA) IVPB 500 mg/100 mL premix, 500 mg, Intravenous, Q24H, Chesley Mires, MD, Stopped at 03/31/21 1101 .  lidocaine (PF) (XYLOCAINE) 1 % injection 5 mL, 5 mL, Intradermal, PRN, Corliss Parish, MD .  lidocaine-prilocaine (EMLA) cream 1 application, 1 application, Topical, PRN, Corliss Parish, MD .  LORazepam (ATIVAN) injection 2 mg, 2 mg, Intravenous, Q5 min PRN, Emokpae, Ejiroghene E, MD .  MEDLINE mouth rinse, 15 mL, Mouth Rinse, 10 times per day, Kipp Brood, MD, 15 mL at 04/01/21 0352 .  midodrine (PROAMATINE) tablet 5 mg, 5 mg, Oral, TID WC, Reesa Chew, MD .  multivitamin with minerals tablet 1 tablet, 1 tablet, Oral, Daily, Barton Dubois, MD .  mupirocin ointment (BACTROBAN) 2 % 1 application, 1 application, Nasal, BID, Barton Dubois, MD, 1 application at 94/85/46 2119 .  norepinephrine (LEVOPHED) 16 mg in 269m premix infusion, 0-40 mcg/min, Intravenous, Titrated, MBarton Dubois MD, Last Rate: 8.44 mL/hr at 04/01/21 0900, 9 mcg/min at 04/01/21 0900 .  ondansetron (ZOFRAN) tablet 4 mg, 4 mg, Oral, Q6H PRN **OR** ondansetron (ZOFRAN) injection 4 mg, 4 mg, Intravenous, Q6H PRN, MBarton Dubois MD .  pantoprazole (PROTONIX) 80 mg in sodium chloride 0.9 % 100 mL (0.8 mg/mL) infusion, 8 mg/hr, Intravenous, Continuous, Hong, JGreggory Brandy MD, Last Rate: 10 mL/hr at 04/01/21 0900, 8 mg/hr at 04/01/21 0900 .  pentafluoroprop-tetrafluoroeth (GEBAUERS) aerosol 1 application, 1 application, Topical, PRN, GCorliss Parish MD .  sodium chloride flush (NS) 0.9 % injection 10-40 mL, 10-40 mL, Intracatheter, Q12H, JAviva Signs MD, 10 mL at 03/31/21 2119 .  sodium chloride flush (NS) 0.9 % injection 10-40 mL, 10-40 mL, Intracatheter, PRN,  JAviva Signs MD .  sodium chloride flush (NS) 0.9 % injection 3 mL, 3 mL, Intravenous, Q12H, MBarton Dubois MD, 3 mL at 03/31/21 2119 .  sodium chloride flush (NS) 0.9 % injection 3 mL, 3 mL, Intravenous, PRN, MBarton Dubois MD .  sodium zirconium cyclosilicate (LOKELMA) packet 10 g, 10 g, Oral, Once, PReesa Chew MD .  tacrolimus (PROGRAF) 1 mg/mL oral suspension 0.5 mg, 0.5 mg, Oral, Daily, SChesley Mires MD  Pertinent Labs Hgb trending upward.  WBCC trending downward.   Imaging MD has reviewed images in epic and the results pertinent to this consultation are:  CT Head No evidence of acute intracranial abnormality on this motion limited study. If the patient's seizures continue, an epilepsy protocol MRI could better evaluate for an underlying epileptogenic abnormality  MRI Brain  Unremarkable   EEG This study is suggestive of severe diffuse encephalopathy, nonspecific etiology but likely related to sedation. No seizures or epileptiform discharges were seen throughout the recording.  Assessment: 55yo male [facility] due to need of EGD due to black stools, + hemmocult, and Hgb  of 4.9. On the day of admission, he was due for HD, but felt weak and passed out on way to session.  -When in ED, patient was noted to become hypotensive with LOC and seizure activity x 1 minute. Per wife, patient had seizure like activity in the past associated with hypotension and resolved with correction of BP. Patient was given Ativan and loaded with Keppra which then continued at 536m po bid.  [facility]  -Eliquis and ASA were held. He was on Effient s/p PCI 03/26/21.   -BP very low, 58/36 to 83/83. IVF boluses were given and patient was put on pressors. Steroid challenge was started and Cortisol was 8.7. Patient placed on pressors which continue today with a BP 1 teens.  -CT negative for acute issue.  -EEG negative  for seizures or epileptiform discharges, but positive for severe diffuse encephalopathy like secondary to sedation.  -Seizure activity felt to be provoked by severe hypotension (syncopal convulsion) and no further seizure activity has been noted since BP corrected.  -Patient is extubated this a.m. and off all sedation. His exam is improved.   Impression: - Likely syncopal convulsion in the setting of acute blood loss anemia/shock - Dialysis disequilibrium syndrome  Plan:  -Continue Keppra 5042mpo bid today, decrease to 25061mo bid on 04/02/21 and then discontinue on 04/03/21.  -continue seizure precautions.  -No need for AED on discharge secondary to known provocation of seizure.  -rest of plan per PCCM.  -AC/anti platelet agents per cardiology.  -HD per nephrology.  -Neurology will be available for questions or seizure activity as Keppra is weaned and stopped.   Pt seen by KarClance BollSN, APN-BC/Nurse Practitioner/Neuro and later by MD. Note and plan to be edited as needed by MD.  Pager: [phone] Neurohospitalist Addendum Patient seen and examined with APP/Resident. Agree with the history and physical as documented above. Agree with the plan as documented, which I helped formulate. I have independently reviewed the chart, obtained history, review of systems and examined the patient.I have personally reviewed pertinent head/neck/spine imaging (CT/MRI). Discussed the plan with the primary attending Dr SooHalford Chessman- AshAmie PortlandD Neurologist [facility] Pager: [phone]
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
## 2021-04-01 NOTE — Progress Notes (Signed)
Nephrology Follow-Up Consult note   Assessment/Recommendations: [name] is a/an 55 y.o. male with a past medical history significant for HTN, HLD, CAD, CHF, afib, OSA, renal tx now back on HD, admitted for GI bleed.  [facility] [phone] MWF   3-1/2 hours EDW 97. HD [place] 2/2.5, Dialyzer 180, Heparin no. Access left aVF.  400 blood flow with 500 DFR-temperature 36.5 Epogen 1400 3 times weekly, Venofer 100 weekly.  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    Complicated by hemorrhagic shock requiring pressor support.  This is now improving.  GI and primary team managing 2 Shock: Likely hemorrhagic/distributive with possible sepsis.  Significantly improved today.  Management of dialysis as above 3 ESRD: Normally MWF at [facility] via aVF.    Patient had partial session of dialysis on 5/16.  Given his hemodynamic instability hopefully can hold off on dialysis till tomorrow when he will be improved and we could do hemodialysis.  Otherwise may need to consider line placement and CRRT if he becomes more unstable 4 hypotension: Has required midodrine in the past.  Could consider starting this today or tomorrow. 5. Anemia of ESRD:  Currently with GI bleed.  Transfusions as above 6. 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    Has been on sodium thiosulfate.  Holding for now given his acute illness.  Can restart once he is more stable. 7. Hypocalcemia: associated with transfusions. Supplement as needed   Recommendations conveyed to primary service.    [facility] 04/01/2021 9:40 AM  ___________________________________________________________  CC: ESRD, GI bleed  Interval History/Subjective: Patient is stabilized more over the past 24 hours.  Now extubated and vasopressors are being weaned.  Tired but awake and alert today.   EGD revealed bleeding angiectasia's in the duodenum which were treated by GI.   Medications:  Current Facility-Administered Medications  Medication Dose Route Frequency Provider Last Rate Last Admin  . 0.9 %  sodium chloride infusion  100 mL Intravenous PRN Corliss Parish, MD      . 0.9 %  sodium chloride infusion  100 mL Intravenous PRN Corliss Parish, MD      . 0.9 %  sodium chloride infusion  250 mL Intravenous Continuous Barton Dubois, MD      . 0.9 %  sodium chloride infusion  250 mL Intravenous PRN Barton Dubois, MD      . 0.9 %  sodium chloride infusion   Intravenous PRN Kipp Brood, MD 10 mL/hr at 03/31/21 1300 Infusion Verify at 03/31/21 1300  . acetaminophen (TYLENOL) tablet 650 mg  650 mg Oral Q6H PRN Chesley Mires, MD       Or  . acetaminophen (TYLENOL) suppository 650 mg  650 mg Rectal Q4H PRN Chesley Mires, MD      . amiodarone (PACERONE) tablet 200 mg  200 mg Oral Daily Barton Dubois, MD      . aspirin chewable tablet 81 mg  81 mg Oral Daily Sood, Vineet, MD      . atorvastatin (LIPITOR) tablet 80 mg  80 mg Oral Daily Sood, Vineet, MD      . chlorhexidine gluconate (MEDLINE KIT) (PERIDEX) 0.12 % solution 15 mL  15 mL Mouth Rinse BID Kipp Brood, MD   15 mL at 04/01/21 0801  . Chlorhexidine Gluconate Cloth 2 % PADS 6 each  6 each Topical  [id number] Corliss Parish, MD   6 each at 03/31/21 0325  . clopidogrel (PLAVIX) tablet 75 mg  75 mg Oral Daily Sood, Elisabeth Cara, MD      . Darbepoetin Alfa (ARANESP) injection 200 mcg  200 mcg Intravenous Q Mon-HD Corliss Parish, MD   200 mcg at 03/30/21 1642  . febuxostat (ULORIC) tablet 40 mg  40 mg Oral Daily Barton Dubois, MD      . hydrocortisone sodium succinate (SOLU-CORTEF) 100 MG injection 50 mg  50 mg Intravenous Q12H Sood, Vineet, MD      . insulin aspart (novoLOG) injection 0-6 Units  0-6 Units Subcutaneous Q4H Gleason, Otilio Carpen, PA-C   1 Units at 03/31/21 1736  . levETIRAcetam (KEPPRA) IVPB 500 mg/100 mL premix   500 mg Intravenous Q24H Chesley Mires, MD   Stopped at 03/31/21 1101  . lidocaine (PF) (XYLOCAINE) 1 % injection 5 mL  5 mL Intradermal PRN Corliss Parish, MD      . lidocaine-prilocaine (EMLA) cream 1 application  1 application Topical PRN Corliss Parish, MD      . LORazepam (ATIVAN) injection 2 mg  2 mg Intravenous Q5 min PRN Emokpae, Ejiroghene E, MD      . MEDLINE mouth rinse  15 mL Mouth Rinse 10 times per day Kipp Brood, MD   15 mL at 04/01/21 0352  . multivitamin with minerals tablet 1 tablet  1 tablet Oral Daily Barton Dubois, MD      . mupirocin ointment (BACTROBAN) 2 % 1 application  1 application Nasal BID Barton Dubois, MD   1 application at 90/30/09 2119  . norepinephrine (LEVOPHED) 16 mg in 279m premix infusion  0-40 mcg/min Intravenous Titrated MBarton Dubois MD 8.44 mL/hr at 04/01/21 0900 9 mcg/min at 04/01/21 0900  . ondansetron (ZOFRAN) tablet 4 mg  4 mg Oral Q6H PRN MBarton Dubois MD       Or  . ondansetron ([facility] injection 4 mg  4 mg Intravenous Q6H PRN MBarton Dubois MD      . pantoprazole (PROTONIX) 80 mg in sodium chloride 0.9 % 100 mL (0.8 mg/mL) infusion  8 mg/hr Intravenous Continuous HLuna Fuse MD 10 mL/hr at 04/01/21 0900 8 mg/hr at 04/01/21 0900  . pentafluoroprop-tetrafluoroeth (GEBAUERS) aerosol 1 application  1 application Topical PRN GCorliss Parish MD      . sodium chloride flush (NS) 0.9 % injection 10-40 mL  10-40 mL Intracatheter Q12H JAviva Signs MD   10 mL at 03/31/21 2119  . sodium chloride flush (NS) 0.9 % injection 10-40 mL  10-40 mL Intracatheter PRN JAviva Signs MD      . sodium chloride flush (NS) 0.9 % injection 3 mL  3 mL Intravenous Q12H MBarton Dubois MD   3 mL at 03/31/21 2119  . sodium chloride flush (NS) 0.9 % injection 3 mL  3 mL Intravenous PRN MBarton Dubois MD      . sodium zirconium cyclosilicate (LOKELMA) packet 10 g  10 g Oral Once PReesa Chew MD      . tacrolimus (PROGRAF) 1 mg/mL oral  suspension 0.5 mg  0.5 mg Oral Daily SChesley Mires MD          Review of Systems: Unable to obtain due to the patient's confusion  Physical Exam: Vitals:   04/01/21 0845 04/01/21 0900  BP: 127/74 (!) 115/94  Pulse: 66 61  Resp: 17 10  Temp:    SpO2: 100% 100%   Total I/O In: 62.5 [I.V.:62.5] Out: -  Intake/Output Summary (Last 24 hours) at 04/01/2021 0940 Last data filed at 04/01/2021 0900 Gross per 24 hour  Intake 1796.22 ml  Output 0 ml  Net 1796.22 ml   Constitutional: Facial swelling, obese, lying in bed, no distress ENMT: ears and nose without scars or lesions, MMM CV: normal rate, no audible murmur Respiratory: Coarse bilateral breath sounds, bilateral chest rise Gastrointestinal: soft, non-tender, no palpable masses or hernias Skin: no visible lesions or rashes Psych: Sedated, not interactive, does not answer to questioning   Test Results I personally reviewed new and old clinical labs and radiology tests Lab Results  Component Value Date   NA 131 (L) 04/01/2021   K 5.3 (H) 04/01/2021   CL 95 (L) 04/01/2021   CO2 22 04/01/2021   BUN 121 (H) 04/01/2021   CREATININE 9.82 (H) 04/01/2021   CALCIUM 6.8 (L) 04/01/2021   ALBUMIN 2.6 (L) 11/24/2016   PHOS 2.3 (L) 11/24/2016

## 2021-04-01 NOTE — Progress Notes (Signed)
Small amount of dark red blood noted from patient's rectum. Most recent hemoglobin 7.2. [place] notified. Will continue to monitor.

## 2021-04-02 ENCOUNTER — Encounter (HOSPITAL_COMMUNITY): Payer: Self-pay | Admitting: Gastroenterology

## 2021-04-02 DIAGNOSIS — N186 End stage renal disease: Secondary | ICD-10-CM

## 2021-04-02 DIAGNOSIS — D649 Anemia, unspecified: Secondary | ICD-10-CM | POA: Diagnosis not present

## 2021-04-02 DIAGNOSIS — K31811 Angiodysplasia of stomach and duodenum with bleeding: Secondary | ICD-10-CM | POA: Diagnosis not present

## 2021-04-02 DIAGNOSIS — K922 Gastrointestinal hemorrhage, unspecified: Secondary | ICD-10-CM | POA: Diagnosis not present

## 2021-04-02 DIAGNOSIS — Z955 Presence of coronary angioplasty implant and graft: Secondary | ICD-10-CM | POA: Diagnosis not present

## 2021-04-02 LAB — RENAL FUNCTION PANEL
Albumin: 2.3 g/dL — ABNORMAL LOW (ref 3.5–5.0)
Anion gap: 17 — ABNORMAL HIGH (ref 5–15)
BUN: 139 mg/dL — ABNORMAL HIGH (ref 6–20)
CO2: 21 mmol/L — ABNORMAL LOW (ref 22–32)
Calcium: 6.5 mg/dL — ABNORMAL LOW (ref 8.9–10.3)
Chloride: 96 mmol/L — ABNORMAL LOW (ref 98–111)
Creatinine, Ser: 11.42 mg/dL — ABNORMAL HIGH (ref 0.61–1.24)
GFR, Estimated: 5 mL/min — ABNORMAL LOW (ref >60)
Glucose, Bld: 102 mg/dL — ABNORMAL HIGH (ref 70–99)
Phosphorus: 6 mg/dL — ABNORMAL HIGH (ref 2.5–4.6)
Potassium: 4.6 mmol/L (ref 3.5–5.1)
Sodium: 134 mmol/L — ABNORMAL LOW (ref 135–145)

## 2021-04-02 LAB — CBC
HCT: 22.3 % — ABNORMAL LOW (ref 39.0–52.0)
Hemoglobin: 7.3 g/dL — ABNORMAL LOW (ref 13.0–17.0)
MCH: 29.7 pg (ref 26.0–34.0)
MCHC: 32.7 g/dL (ref 30.0–36.0)
MCV: 90.7 fL (ref 80.0–100.0)
Platelets: 171 10*3/uL (ref 150–400)
RBC: 2.46 MIL/uL — ABNORMAL LOW (ref 4.22–5.81)
RDW: 19.7 % — ABNORMAL HIGH (ref 11.5–15.5)
WBC: 17.7 10*3/uL — ABNORMAL HIGH (ref 4.0–10.5)
nRBC: 0.1 % (ref 0.0–0.2)

## 2021-04-02 LAB — GLUCOSE, CAPILLARY
Glucose-Capillary: 107 mg/dL — ABNORMAL HIGH (ref 70–99)
Glucose-Capillary: 106 mg/dL — ABNORMAL HIGH (ref 70–99)
Glucose-Capillary: 100 mg/dL — ABNORMAL HIGH (ref 70–99)
Glucose-Capillary: 82 mg/dL (ref 70–99)
Glucose-Capillary: 82 mg/dL (ref 70–99)
Glucose-Capillary: 78 mg/dL (ref 70–99)

## 2021-04-02 LAB — CALCIUM, IONIZED
Calcium, Ionized, Serum: 3.7 mg/dL — ABNORMAL LOW (ref 4.5–5.6)
Calcium, Ionized, Serum: 3.8 mg/dL — ABNORMAL LOW (ref 4.5–5.6)

## 2021-04-02 MED ORDER — PREDNISONE 10 MG PO TABS
10.0000 mg | ORAL_TABLET | Freq: Every day | ORAL | Status: DC
  Administered 2021-04-03 – 2021-04-07 (×5): 10 mg via ORAL
  Filled 2021-04-02 (×5): qty 1

## 2021-04-02 MED ORDER — POLYETHYLENE GLYCOL 3350 17 G PO PACK
17.0000 g | PACK | Freq: Once | ORAL | Status: AC
  Administered 2021-04-02: 17 g via ORAL
  Filled 2021-04-02: qty 1

## 2021-04-02 MED ORDER — DOCUSATE SODIUM 100 MG PO CAPS
100.0000 mg | ORAL_CAPSULE | Freq: Every day | ORAL | Status: DC | PRN
  Administered 2021-04-03: 100 mg via ORAL
  Filled 2021-04-02: qty 1

## 2021-04-02 MED ORDER — MIDODRINE HCL 5 MG PO TABS
10.0000 mg | ORAL_TABLET | Freq: Three times a day (TID) | ORAL | Status: DC
  Administered 2021-04-02 – 2021-04-03 (×4): 10 mg via ORAL
  Filled 2021-04-02 (×4): qty 2

## 2021-04-02 MED FILL — Medication: Qty: 1 | Status: AC

## 2021-04-02 NOTE — Progress Notes (Signed)
Nephrology Follow-Up Consult note   Assessment/Recommendations: [name] is a/an 55 y.o. male with a past medical history significant for HTN, HLD, CAD, CHF, afib, OSA, renal tx now back on HD, admitted for GI bleed.  [facility] [phone] MWF   3-1/2 hours EDW 97. HD [place] 2/2.5, Dialyzer 180, Heparin no. Access left aVF.  400 blood flow with 500 DFR-temperature 36.5 Epogen 1400 3 times weekly, Venofer 100 weekly.  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    Complicated by hemorrhagic shock requiring pressor support.  This is now improving.  GI and primary team managing 2 Shock: Likely hemorrhagic/distributive with possible sepsis.  Also with hypotension chronically as below.  Significant improvement remains on a small amount of norepinephrine.  Management of dialysis as below 3 ESRD: Normally MWF at [facility] via aVF.    Patient had partial session of dialysis on 5/16.  Continues to have hemodynamic instability which is mostly chronic.  Agreed to start midodrine today after disagreement yesterday.  We will try to do dialysis today, may have to use a little norepinephrine while on dialysis 4 hypotension: Has required midodrine in the past.  Started midodrine 10 mg 3 times daily 5. Anemia of ESRD:  Currently with GI bleed.  Transfusions as above 6. 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    Has been on sodium thiosulfate.  Holding for now given his acute illness.  Can restart once he is more stable. 7. Hypocalcemia: associated with transfusions. Supplement as needed   Recommendations conveyed to primary service.    [facility] 04/02/2021 10:03 AM  ___________________________________________________________  CC: ESRD, GI bleed  Interval History/Subjective: Hemoglobin fairly stable around 7.  Minimal norepinephrine  requirement.  Remains borderline hypotensive.  Patient feeling better today.   Medications:  Current Facility-Administered Medications  Medication Dose Route Frequency Provider Last Rate Last Admin  . 0.9 %  sodium chloride infusion  100 mL Intravenous PRN Corliss Parish, MD      . 0.9 %  sodium chloride infusion  100 mL Intravenous PRN Corliss Parish, MD      . 0.9 %  sodium chloride infusion  250 mL Intravenous Continuous Barton Dubois, MD      . 0.9 %  sodium chloride infusion  250 mL Intravenous PRN Barton Dubois, MD      . 0.9 %  sodium chloride infusion   Intravenous PRN Kipp Brood, MD   Stopped at 04/02/21 0056  . acetaminophen (TYLENOL) tablet 650 mg  650 mg Oral Q6H PRN Chesley Mires, MD       Or  . acetaminophen (TYLENOL) suppository 650 mg  650 mg Rectal Q4H PRN Chesley Mires, MD      . amiodarone (PACERONE) tablet 200 mg  200 mg Oral Daily Barton Dubois, MD   200 mg at 04/02/21 0951  . aspirin EC tablet 81 mg  81 mg Oral Daily Dimple Nanas, RPH   81 mg at 04/02/21 0951  . atorvastatin (LIPITOR) tablet 80 mg  80 mg Oral Daily Chesley Mires, MD   80 mg at 04/02/21 0952  . Chlorhexidine Gluconate Cloth 2 % PADS 6 each  6 each Topical Q0600 Corliss Parish, MD   6 each at 04/01/21 1508  . clopidogrel (PLAVIX) tablet 75 mg  75 mg Oral Daily Chesley Mires, MD   75 mg  at 04/02/21 0951  . Darbepoetin Alfa (ARANESP) injection 200 mcg  200 mcg Intravenous Q Mon-HD Corliss Parish, MD   200 mcg at 03/30/21 1642  . febuxostat (ULORIC) tablet 40 mg  40 mg Oral Daily Barton Dubois, MD   40 mg at 04/02/21 0951  . hydrocortisone sodium succinate (SOLU-CORTEF) 100 MG injection 50 mg  50 mg Intravenous Q12H Chesley Mires, MD   50 mg at 04/02/21 0304  . insulin aspart (novoLOG) injection 0-6 Units  0-6 Units Subcutaneous Q4H Gleason, Otilio Carpen, PA-C   1 Units at 03/31/21 1736  . lidocaine (PF) (XYLOCAINE) 1 % injection 5 mL  5 mL Intradermal PRN Corliss Parish, MD       . lidocaine-prilocaine (EMLA) cream 1 application  1 application Topical PRN Corliss Parish, MD      . LORazepam (ATIVAN) injection 2 mg  2 mg Intravenous Q5 min PRN Emokpae, Ejiroghene E, MD      . midodrine (PROAMATINE) tablet 10 mg  10 mg Oral TID WC Reesa Chew, MD   10 mg at 04/02/21 0951  . multivitamin with minerals tablet 1 tablet  1 tablet Oral Daily Barton Dubois, MD   1 tablet at 04/02/21 0951  . mupirocin ointment (BACTROBAN) 2 % 1 application  1 application Nasal BID Barton Dubois, MD   1 application at [phone]  . norepinephrine (LEVOPHED) 16 mg in 263m premix infusion  0-40 mcg/min Intravenous Titrated MBarton Dubois MD 1.88 mL/hr at 04/02/21 0800 2 mcg/min at 04/02/21 0800  . ondansetron (ZOFRAN) tablet 4 mg  4 mg Oral Q6H PRN MBarton Dubois MD       Or  . ondansetron ([facility] injection 4 mg  4 mg Intravenous Q6H PRN MBarton Dubois MD      . pantoprazole (PROTONIX) 80 mg in sodium chloride 0.9 % 100 mL (0.8 mg/mL) infusion  8 mg/hr Intravenous Continuous HLuna Fuse MD 10 mL/hr at 04/02/21 0800 8 mg/hr at 04/02/21 0800  . pantoprazole (PROTONIX) EC tablet 40 mg  40 mg Oral BID GVena Rua PA-C      . pentafluoroprop-tetrafluoroeth (GEBAUERS) aerosol 1 application  1 application Topical PRN GCorliss Parish MD      . sodium chloride flush (NS) 0.9 % injection 10-40 mL  10-40 mL Intracatheter Q12H JAviva Signs MD   10 mL at 04/01/21 2118  . sodium chloride flush (NS) 0.9 % injection 10-40 mL  10-40 mL Intracatheter PRN JAviva Signs MD      . sodium chloride flush (NS) 0.9 % injection 3 mL  3 mL Intravenous Q12H MBarton Dubois MD   3 mL at 04/01/21 2118  . sodium chloride flush (NS) 0.9 % injection 3 mL  3 mL Intravenous PRN MBarton Dubois MD      . tacrolimus (PROGRAF) capsule 0.5 mg  0.5 mg Oral Daily SChesley Mires MD          Review of Systems: Unable to obtain due to the patient's confusion  Physical Exam: Vitals:   04/02/21  0830 04/02/21 0845  BP: (!) 89/61 (!) 86/63  Pulse: 70 68  Resp: (!) 32 12  Temp:    SpO2: 97% 100%   Total I/O In: 132.2 [P.O.:120; I.V.:12.2] Out: -   Intake/Output Summary (Last 24 hours) at 04/02/2021 1003 Last data filed at 04/02/2021 0800 Gross per 24 hour  Intake 1760.68 ml  Output --  Net 1760.68 ml   Constitutional: Facial swelling, obese, lying in bed, no distress ENMT:  ears and nose without scars or lesions, MMM CV: normal rate Respiratory: No increased work of breathing, bilateral chest rise Gastrointestinal: soft, non-tender, no palpable masses or hernias Skin: no visible lesions or rashes Psych: Interactive, appropriate mood and affect   Test Results I personally reviewed new and old clinical labs and radiology tests Lab Results  Component Value Date   NA 134 (L) 04/02/2021   K 4.6 04/02/2021   CL 96 (L) 04/02/2021   CO2 21 (L) 04/02/2021   BUN 139 (H) 04/02/2021   CREATININE 11.42 (H) 04/02/2021   CALCIUM 6.5 (L) 04/02/2021   ALBUMIN 2.3 (L) 04/02/2021   PHOS 6.0 (H) 04/02/2021

## 2021-04-02 NOTE — Progress Notes (Signed)
NAME:  [name], MRN:  [id number], DOB:  June 30, 1966, LOS: 3 ADMISSION DATE:  03/30/2021, CONSULTATION DATE:  04/02/21 REFERRING MD: Dr. Lamont Snowball, [facility] CHIEF COMPLAINT:  UGIB   History of Present Illness:  55 yo with with CAD s/p stent recently, A fib on eliquis presented to [facility] with syncope.  Found to have hematemesis with hemorrhagic shock and Hb 4.6.  Noted to also have seizure like activity.  Started on protonix infusion, required intubation for airway protection, and transferred to [facility] for further management.  Pertinent  Medical History  ESRD s/p renal transplant on prednisone and prograf, CAD s/p stent, A fib on eliquis, HTN, Diastolic CHF, HLD, OSA, Pneumonia  Significant Hospital Events:  . 5/16 Admit to [facility], episodes of hematemesis and seizure, transfer to [facility].  Transfused 3 units PRBC's . 5/17 EGD . 5/18 Extubate  Tests:   CT head 5/16 >> normal  MRI brain 5/17 >> unremarkable  EEG 5/17 >> continuous slowing, excess beta  EGD 5/17 >> single actively bleeding angioectasia in duodema  04/03/2021 successfully excavated awake alert no acute distress D intensifying care  Interim History / Subjective:  Currently on low-dose Levophed with midodrine been instituted  Objective   Blood pressure (!) 86/63, pulse 68, temperature (!) 96.2 F (35.7 C), temperature source Axillary, resp. rate 12, height '6\' 1"'$  (1.854 m), weight 101.9 kg, SpO2 100 %.        Intake/Output Summary (Last 24 hours) at 04/02/2021 0936 Last data filed at 04/02/2021 0800 Gross per 24 hour  Intake 1776.31 ml  Output --  Net 1776.31 ml   Filed Weights   03/30/21 1343 03/30/21 1450 03/31/21 0336  Weight: 99.8 kg 99.8 kg 101.9 kg   Physical Exam: General: Middle-aged obese male no acute distress currently on CPAP HEENT: No JVD or lymphadenopathy is appreciated Neuro: Grossly intact without focal defect CV: Heart sounds are regular PULM: Diminished breath sounds in the bases  GI: soft, bsx4  active  Extremities: warm/dry, 2+ edema, left AV graft forearm bruit and thrill. Left femoral CVL 3 port is in place unremarkable Skin: no rashes or lesions    Resolved Hospital Problem list     Assessment & Plan:   Compromised airway in setting of Upper GI bleeding and seizure. Extubated on 04/02/2019 Underlying OSA being treated with CPAP  Hemorrhagic shock in setting of Upper GI bleeding present on admission. Recent Labs    04/01/21 1722 04/02/21 0308  HGB 6.8* 7.3*    Transfuse per protocol  Upper GI bleeding with angioectasia in duodenum present on admission. GI is following Telemetry strips transition to bolus If he rebleeds might need embolization by interventional radiology    New onset seizure in setting of shock and cerebral hypoperfusion state. Appreciate neurology's input   ESRD s/p renal transplant. Hyperkalemia. Recent Labs  Lab 04/01/21 0342 04/01/21 1350 04/02/21 0307  K 5.3* 4.9 4.6    Nephrology is following Midodrine is instituted Transition Solu-Cortef to prednisone D intensify care Once off pressors can discontinue left femoral line    CAD s/p recent stenting. A fib present prior to admission. Chronic diastolic CHF, HTN, HLD.  Cardiology is following Continue enteric-coated aspirin Plavix Continue amiodarone Lipitor Holding Eliquis    Best practice (right click and "Reselect all SmartList Selections" daily)  Diet: Sips with meds DVT prophylaxis: SCDs GI prophylaxis: Protonix Central venous access:  Yes, and it is still needed Mobility:  bed rest  Last date of multidisciplinary goals of care  discussion [pending] Code Status:  full code Disposition: ICU  Labs    CMP Latest Ref Rng & Units 04/02/2021 04/01/2021 04/01/2021  Glucose 70 - 99 mg/dL 102(H) 101(H) 141(H)  BUN 6 - 20 mg/dL 139(H) 131(H) 121(H)  Creatinine 0.61 - 1.24 mg/dL 11.42(H) 10.36(H) 9.82(H)  Sodium 135 - 145 mmol/L 134(L) 133(L) 131(L)  Potassium 3.5 -  5.1 mmol/L 4.6 4.9 5.3(H)  Chloride 98 - 111 mmol/L 96(L) 96(L) 95(L)  CO2 22 - 32 mmol/L 21(L) 22 22  Calcium 8.9 - 10.3 mg/dL 6.5(L) 6.6(L) 6.8(L)  Total Protein 6.5 - 8.1 g/dL - - -  Total Bilirubin 0.3 - 1.2 mg/dL - - -  Alkaline Phos 38 - 126 U/L - - -  AST 15 - 41 U/L - - -  ALT 17 - 63 U/L - - -    CBC Latest Ref Rng & Units 04/02/2021 04/01/2021 04/01/2021  WBC 4.0 - 10.5 K/uL 17.7(H) - -  Hemoglobin 13.0 - 17.0 g/dL 7.3(L) 6.8(LL) 6.8(LL)  Hematocrit 39.0 - 52.0 % 22.3(L) 20.5(L) 20.7(L)  Platelets 150 - 400 K/uL 171 - -    ABG    Component Value Date/Time   PHART 7.459 (H) 03/31/2021 0953   PCO2ART 37.3 03/31/2021 0953   PO2ART 132 (H) 03/31/2021 0953   HCO3 26.5 03/31/2021 0953   TCO2 28 03/31/2021 0953   O2SAT 99.0 03/31/2021 0953    CBG (last 3)  Recent Labs    04/01/21 2345 04/02/21 0356 04/02/21 0734  GLUCAP 117* 107* 106*    Critical care time: 30 minutes  Richardson Landry Jemaine Prokop ACNP Acute Care Nurse Practitioner [place] Please consult Amion 04/02/2021, 9:36 AM
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
## 2021-04-02 NOTE — Plan of Care (Signed)
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
## 2021-04-02 NOTE — Progress Notes (Signed)
Pt had family bring cpap from home. Pt already on home cpap when RT entered the room.

## 2021-04-03 ENCOUNTER — Inpatient Hospital Stay (HOSPITAL_COMMUNITY): Payer: Medicare Other

## 2021-04-03 DIAGNOSIS — D649 Anemia, unspecified: Secondary | ICD-10-CM

## 2021-04-03 DIAGNOSIS — K922 Gastrointestinal hemorrhage, unspecified: Secondary | ICD-10-CM | POA: Diagnosis not present

## 2021-04-03 LAB — HEMOGLOBIN AND HEMATOCRIT, BLOOD
HCT: 23.7 % — ABNORMAL LOW (ref 39.0–52.0)
Hemoglobin: 7.6 g/dL — ABNORMAL LOW (ref 13.0–17.0)

## 2021-04-03 LAB — CBC
HCT: 21.8 % — ABNORMAL LOW (ref 39.0–52.0)
Hemoglobin: 7 g/dL — ABNORMAL LOW (ref 13.0–17.0)
MCH: 29.9 pg (ref 26.0–34.0)
MCHC: 32.1 g/dL (ref 30.0–36.0)
MCV: 93.2 fL (ref 80.0–100.0)
Platelets: 196 10*3/uL (ref 150–400)
RBC: 2.34 MIL/uL — ABNORMAL LOW (ref 4.22–5.81)
RDW: 20.4 % — ABNORMAL HIGH (ref 11.5–15.5)
WBC: 15.5 10*3/uL — ABNORMAL HIGH (ref 4.0–10.5)
nRBC: 0.3 % — ABNORMAL HIGH (ref 0.0–0.2)

## 2021-04-03 LAB — BASIC METABOLIC PANEL
Anion gap: 9 (ref 5–15)
BUN: 60 mg/dL — ABNORMAL HIGH (ref 6–20)
CO2: 27 mmol/L (ref 22–32)
Calcium: 6.6 mg/dL — ABNORMAL LOW (ref 8.9–10.3)
Chloride: 99 mmol/L (ref 98–111)
Creatinine, Ser: 7.25 mg/dL — ABNORMAL HIGH (ref 0.61–1.24)
GFR, Estimated: 8 mL/min — ABNORMAL LOW (ref >60)
Glucose, Bld: 100 mg/dL — ABNORMAL HIGH (ref 70–99)
Potassium: 3.6 mmol/L (ref 3.5–5.1)
Sodium: 135 mmol/L (ref 135–145)

## 2021-04-03 LAB — GLUCOSE, CAPILLARY
Glucose-Capillary: 77 mg/dL (ref 70–99)
Glucose-Capillary: 86 mg/dL (ref 70–99)

## 2021-04-03 LAB — MAGNESIUM: Magnesium: 2 mg/dL (ref 1.7–2.4)

## 2021-04-03 LAB — PHOSPHORUS: Phosphorus: 4.3 mg/dL (ref 2.5–4.6)

## 2021-04-03 MED ORDER — SODIUM CHLORIDE 0.9% IV SOLUTION: Freq: Once | INTRAVENOUS | Status: AC

## 2021-04-03 MED ORDER — TAMSULOSIN HCL 0.4 MG PO CAPS
0.4000 mg | ORAL_CAPSULE | Freq: Every day | ORAL | Status: DC
  Administered 2021-04-03: 0.4 mg via ORAL
  Filled 2021-04-03: qty 1

## 2021-04-03 MED ORDER — FLUDROCORTISONE ACETATE 0.1 MG PO TABS
0.1000 mg | ORAL_TABLET | Freq: Every day | ORAL | Status: DC
  Administered 2021-04-04 – 2021-04-07 (×4): 0.1 mg via ORAL
  Filled 2021-04-03 (×5): qty 1

## 2021-04-03 MED ORDER — MIDODRINE HCL 5 MG PO TABS
15.0000 mg | ORAL_TABLET | Freq: Three times a day (TID) | ORAL | Status: DC
  Administered 2021-04-03 – 2021-04-07 (×13): 15 mg via ORAL
  Filled 2021-04-03 (×13): qty 3

## 2021-04-03 NOTE — Plan of Care (Signed)

## 2021-04-03 NOTE — Progress Notes (Signed)
Daily Rounding Note  04/03/2021, 8:16 AM  LOS: 4 days   SUBJECTIVE:   Chief complaint:  UGI bleed    Off Levophed.  On midodrine w consistently soft BPs, no tachycardia.   Small black stool at shift change.  Tolerating solid food.  No nausea, no abd pain  OBJECTIVE:         Vital signs in last 24 hours:    Temp:  [97.1 F (36.2 C)-98.7 F (37.1 C)] 97.8 F (36.6 C) (05/19 2333) Pulse Rate:  [59-101] 59 (05/20 0715) Resp:  [8-36] 19 (05/20 0715) BP: (72-118)/(33-82) 90/54 (05/20 0715) SpO2:  [90 %-100 %] 99 % (05/20 0715) Weight:  [107 kg-108 kg] 107 kg (05/19 1825) Last BM Date: 04/02/21 Filed Weights   03/31/21 0336 04/02/21 1430 04/02/21 1825  Weight: 101.9 kg 108 kg 107 kg   General: looks better but chronically ill, cushinoid appearance continues.  Alert and comfortable   Heart: RRR Chest: no labored breathing or cough Abdomen: soft, NT.  Active BS  Neuro/Psych:  Pleasant, alert, calm, fully oriented.  No gross deficits.    Intake/Output from previous day: 05/19 0701 - 05/20 0700 In: 766.6 [P.O.:600; I.V.:166.6] Out: 982 [Stool:1]  Intake/Output this shift: No intake/output data recorded.  Lab Results: Recent Labs    04/01/21 0342 04/01/21 1350 04/01/21 1722 04/02/21 0308 04/03/21 0417  WBC 19.4*  --   --  17.7* 15.5*  HGB 7.2*   < > 6.8* 7.3* 7.0*  HCT 21.8*   < > 20.5* 22.3* 21.8*  PLT 174  --   --  171 196   < > = values in this interval not displayed.   BMET Recent Labs    04/01/21 1350 04/02/21 0307 04/03/21 0417  NA 133* 134* 135  K 4.9 4.6 3.6  CL 96* 96* 99  CO2 22 21* 27  GLUCOSE 101* 102* 100*  BUN 131* 139* 60*  CREATININE 10.36* 11.42* 7.25*  CALCIUM 6.6* 6.5* 6.6*   LFT Recent Labs    04/02/21 0307  ALBUMIN 2.3*   PT/INR No results for input(s): LABPROT, INR in the last 72 hours. Hepatitis Panel No results for input(s): HEPBSAG, HCVAB, HEPAIGM, HEPBIGM in the  last 72 hours.  Studies/Results: DG Chest Port 1 View  Result Date: 04/03/2021 CLINICAL DATA:  Respiratory distress EXAM: PORTABLE CHEST 1 VIEW COMPARISON:  04/01/2021 FINDINGS: The endotracheal tube and nasogastric tube have been removed. Pulmonary insufflation is stable. Shrapnel overlying the right axilla and mid lung zone with associated right lateral pleural thickening and parenchymal scarring within the mid lung zone is unchanged. Moderate probable partially loculated right pleural effusion is unchanged. Superimposed bibasilar pulmonary consolidation has significantly improved. No pneumothorax or pleural effusion on the left. Cardiac size within normal limits. IMPRESSION: Interval extubation with preservation of pulmonary insufflation. Improved bibasilar pulmonary consolidation. Stable moderate right basilar probable partially loculated pleural effusion. Unchanged shrapnel and probable posttraumatic parenchymal and pleural scarring involving the right mid lung zone. Electronically Signed   By: Fidela Salisbury MD   On: 04/03/2021 06:11   Scheduled Meds: . amiodarone  200 mg Oral Daily  . aspirin EC  81 mg Oral Daily  . atorvastatin  80 mg Oral Daily  . Chlorhexidine Gluconate Cloth  6 each Topical Q0600  . clopidogrel  75 mg Oral Daily  . darbepoetin (ARANESP) injection - DIALYSIS  200 mcg Intravenous Q Mon-HD  . febuxostat  40 mg Oral Daily  .  insulin aspart  0-6 Units Subcutaneous Q4H  . midodrine  10 mg Oral TID WC  . multivitamin with minerals  1 tablet Oral Daily  . mupirocin ointment  1 application Nasal BID  . pantoprazole  40 mg Oral BID  . predniSONE  10 mg Oral Q breakfast  . sodium chloride flush  10-40 mL Intracatheter Q12H  . sodium chloride flush  3 mL Intravenous Q12H  . tacrolimus  0.5 mg Oral Daily   Continuous Infusions: . sodium chloride    . sodium chloride    . sodium chloride    . sodium chloride    . sodium chloride 10 mL/hr at 04/03/21 0600  . norepinephrine  (LEVOPHED) Adult infusion 4 mcg/min (04/03/21 0600)   PRN Meds:.sodium chloride, sodium chloride, sodium chloride, sodium chloride, acetaminophen **OR** acetaminophen, docusate sodium, lidocaine (PF), lidocaine-prilocaine, LORazepam, ondansetron **OR** ondansetron (ZOFRAN) IV, pentafluoroprop-tetrafluoroeth, sodium chloride flush, sodium chloride flush   ASSESMENT:   * UGIB 03/31/2021 EGD with bleeding in duodenum, suspect due to AVM. Treated with  hemospray. Finished 72-hour Protonix drip AM 5/19, transitioned to  Protonix 40 mg p.o. bid.     *ABL anemia, baseline chronic anemia Hgb ~ 8 in 2021.  . 3 PRBCs at outside hospital,6 PRBCs at [facility].  Hgb 7.3 >> 7.0 over last 24 h.   PO iron PTA.  11/2020 Colonoscopy in [place]: diverticulosis.   .  *[place]is[place] Dr Oval Linsey initiatedPlavix5/17 and 81 ASA. No plans to restart Eliquis.   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  Chronic Prednisone, Prograf.   PLAN   *   Daily CBC If recurrent acute bleeding: bleeding scan +/- angiogram.    *   GI signing off.  Available prn.  No need for GI office fup in [place].  Has established GI provider in [place] for outpt fup prn.      Azucena Freed  04/03/2021, 8:16 AM Phone [phone]

## 2021-04-03 NOTE — Progress Notes (Signed)
Nephrology Follow-Up Consult note   Assessment/Recommendations: [name] is a/an 55 y.o. male with a past medical history significant for HTN, HLD, CAD, CHF, afib, OSA, renal tx now back on HD, admitted for GI bleed.  [facility] [phone] MWF   3-1/2 hours EDW 97. HD [place] 2/2.5, Dialyzer 180, Heparin no. Access left aVF.  400 blood flow with 500 DFR-temperature 36.5 Epogen 1400 3 times weekly, Venofer 100 weekly.  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    Complicated by hemorrhagic shock requiring pressor support.  This is now improving.  GI and primary team managing 2 Shock: Likely hemorrhagic/distributive with possible sepsis.  Now resolved with chronic hypotension as below 3 ESRD: Normally MWF at [facility] via aVF.    Hypotension has complicated dialysis.  Tolerated very well yesterday.  Plan for dialysis again tomorrow then return to MWF schedule 4 hypotension: Has required midodrine in the past.  Started midodrine 10 mg 3 times daily 5. Anemia of ESRD:  Currently with GI bleed.  Transfusions as above 6. 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    Has been on sodium thiosulfate.  Holding for now given his acute illness.  May restart with dialysis session tomorrow. 7. Hypocalcemia: associated with transfusions. Supplement as needed   Recommendations conveyed to primary service.    [facility] 04/03/2021 10:43 AM  ___________________________________________________________  CC: ESRD, GI bleed  Interval History/Subjective: Hemoglobin continues to be stable around 7.  Tolerated dialysis yesterday without significant problems despite his chronic hypotension.  No complaints today.   Medications:  Current Facility-Administered Medications  Medication Dose Route Frequency Provider Last Rate Last Admin  . 0.9  %  sodium chloride infusion  100 mL Intravenous PRN Corliss Parish, MD      . 0.9 %  sodium chloride infusion  100 mL Intravenous PRN Corliss Parish, MD      . 0.9 %  sodium chloride infusion  250 mL Intravenous Continuous Barton Dubois, MD      . 0.9 %  sodium chloride infusion  250 mL Intravenous PRN Barton Dubois, MD      . 0.9 %  sodium chloride infusion   Intravenous PRN Kipp Brood, MD   Stopped at 04/03/21 [id number]  . acetaminophen (TYLENOL) tablet 650 mg  650 mg Oral Q6H PRN Chesley Mires, MD   650 mg at 04/03/21 0756   Or  . acetaminophen (TYLENOL) suppository 650 mg  650 mg Rectal Q4H PRN Chesley Mires, MD      . amiodarone (PACERONE) tablet 200 mg  200 mg Oral Daily Barton Dubois, MD   200 mg at 04/03/21 0956  . aspirin EC tablet 81 mg  81 mg Oral Daily Dimple Nanas, RPH   81 mg at 04/03/21 [id number]  . atorvastatin (LIPITOR) tablet 80 mg  80 mg Oral Daily Chesley Mires, MD   80 mg at 04/03/21 0956  . Chlorhexidine Gluconate Cloth 2 % PADS 6 each  6 each Topical Q0600 Corliss Parish, MD   6 each at 04/03/21 0957  . clopidogrel (PLAVIX) tablet 75 mg  75 mg Oral Daily Chesley Mires, MD   75 mg at 04/03/21 0956  . Darbepoetin Alfa (ARANESP) injection 200 mcg  200 mcg Intravenous Q Mon-HD Corliss Parish, MD   200 mcg at 03/30/21 1642  . docusate sodium (COLACE) capsule 100  mg  100 mg Oral Daily PRN Minor, Grace Bushy, NP   100 mg at 04/03/21 0955  . febuxostat (ULORIC) tablet 40 mg  40 mg Oral Daily Barton Dubois, MD   40 mg at 04/03/21 0956  . insulin aspart (novoLOG) injection 0-6 Units  0-6 Units Subcutaneous Q4H Gleason, Otilio Carpen, PA-C   1 Units at 03/31/21 1736  . lidocaine (PF) (XYLOCAINE) 1 % injection 5 mL  5 mL Intradermal PRN Corliss Parish, MD      . lidocaine-prilocaine (EMLA) cream 1 application  1 application Topical PRN Corliss Parish, MD      . LORazepam (ATIVAN) injection 2 mg  2 mg Intravenous Q5 min PRN Emokpae, Ejiroghene E, MD      .  midodrine (PROAMATINE) tablet 10 mg  10 mg Oral TID WC Reesa Chew, MD   10 mg at 04/03/21 0725  . multivitamin with minerals tablet 1 tablet  1 tablet Oral Daily Barton Dubois, MD   1 tablet at 04/03/21 [phone]  . mupirocin ointment (BACTROBAN) 2 % 1 application  1 application Nasal BID Barton Dubois, MD   1 application at [phone]  . norepinephrine (LEVOPHED) 16 mg in 239m premix infusion  0-40 mcg/min Intravenous Titrated MBarton Dubois MD   Stopped at 04/03/21 [phone] . ondansetron (ZOFRAN) tablet 4 mg  4 mg Oral Q6H PRN MBarton Dubois MD       Or  . ondansetron ([facility] injection 4 mg  4 mg Intravenous Q6H PRN MBarton Dubois MD      . pantoprazole (PROTONIX) EC tablet 40 mg  40 mg Oral BID GVena Rua PA-C   40 mg at 04/03/21 0955  . pentafluoroprop-tetrafluoroeth (GEBAUERS) aerosol 1 application  1 application Topical PRN GCorliss Parish MD      . predniSONE (DELTASONE) tablet 10 mg  10 mg Oral Q breakfast SChesley Mires MD   10 mg at 04/03/21 0725  . sodium chloride flush (NS) 0.9 % injection 10-40 mL  10-40 mL Intracatheter Q12H JAviva Signs MD   30 mL at 04/03/21 0957  . sodium chloride flush (NS) 0.9 % injection 10-40 mL  10-40 mL Intracatheter PRN JAviva Signs MD      . sodium chloride flush (NS) 0.9 % injection 3 mL  3 mL Intravenous Q12H MBarton Dubois MD   3 mL at 04/02/21 2211  . sodium chloride flush (NS) 0.9 % injection 3 mL  3 mL Intravenous PRN MBarton Dubois MD      . tacrolimus (PROGRAF) capsule 0.5 mg  0.5 mg Oral Daily SChesley Mires MD   0.5 mg at 04/02/21 1314      Review of Systems: 10 systems reviewed and negative except per HPI  Physical Exam: Vitals:   04/03/21 1000 04/03/21 1021  BP: (!) 111/93 (!) 111/93  Pulse: 72 63  Resp: 15 16  Temp:  (!) 97.2 F (36.2 C)  SpO2: 91% 100%   Total I/O In: 155.2 [P.O.:120; I.V.:35.2] Out: -   Intake/Output Summary (Last 24 hours) at 04/03/2021 1043 Last data filed at 04/03/2021 0957 Gross  per 24 hour  Intake 779.61 ml  Output 982 ml  Net -202.39 ml   Constitutional: Sitting in bed without distress ENMT: ears and nose without scars or lesions, MMM CV: normal rate Respiratory: No increased work of breathing, bilateral chest rise Gastrointestinal: soft, non-tender, no palpable masses or hernias Skin: no visible lesions or rashes Psych: Interactive, appropriate mood and affect   Test Results  I personally reviewed new and old clinical labs and radiology tests Lab Results  Component Value Date   NA 135 04/03/2021   K 3.6 04/03/2021   CL 99 04/03/2021   CO2 27 04/03/2021   BUN 60 (H) 04/03/2021   CREATININE 7.25 (H) 04/03/2021   CALCIUM 6.6 (L) 04/03/2021   ALBUMIN 2.3 (L) 04/02/2021   PHOS 4.3 04/03/2021

## 2021-04-03 NOTE — Evaluation (Signed)
Physical Therapy Evaluation Patient Details Name: [name] MRN: [id number] DOB: Aug 31, 1966 Today's Date: 04/03/2021   History of Present Illness  [name] is a/an 55 y.o. male admitted for GIB.  PMH: HLD, CAD, CHF, afib, OSA, renal tx now back on HD  Clinical Impression  Pt admitted with above diagnosis. Pt was able to ambulate with RW with min assist on unit limited by fatigue.  Pt overall steady gait with RW but requiring min assist with transitions. Will follow acutely and pt should progress well.  Pt currently with functional limitations due to the deficits listed below (see PT Problem List). Pt will benefit from skilled PT to increase their independence and safety with mobility to allow discharge to the venue listed below.      Follow Up Recommendations Home health PT;Supervision/Assistance - 24 hour (may need initial 24 hour care)    Equipment Recommendations  None recommended by PT    Recommendations for Other Services       Precautions / Restrictions Precautions Precautions: Fall Restrictions Weight Bearing Restrictions: No      Mobility  Bed Mobility Overal bed mobility: Needs Assistance Bed Mobility: Supine to Sit     Supine to sit: Min assist     General bed mobility comments: a little assist for trunk elevation    Transfers Overall transfer level: Needs assistance Equipment used: Rolling walker (2 wheeled) Transfers: Sit to/from Stand Sit to Stand: Min assist;+2 safety/equipment;From elevated surface;Mod assist         General transfer comment: needed mod assist from lower surfaces to power up.  min assist from elevated surface  Ambulation/Gait Ambulation/Gait assistance: Min assist;+2 safety/equipment Gait Distance (Feet): 120 Feet Assistive device: Rolling walker (2 wheeled) Gait Pattern/deviations: Step-through pattern;Decreased stride length;Drifts right/left;Wide base of support   Gait velocity interpretation: <1.31 ft/sec,  indicative of household ambulator General Gait Details: Pt needed assist to place right UE onto RW due to previous injury to right hand.  Pt ambulates with min assist and cues to stay close to rW and to sequence steps and RW. Pts stability improved as pt progressed his ambulation.Follwoed pt with chair as he fatigues and he did need to sit after 120 feet.  Stairs            Wheelchair Mobility    Modified Rankin (Stroke Patients Only)       Balance Overall balance assessment: Needs assistance Sitting-balance support: No upper extremity supported;Feet supported Sitting balance-Leahy Scale: Fair     Standing balance support: Bilateral upper extremity supported;During functional activity Standing balance-Leahy Scale: Poor Standing balance comment: relies on UE support for balance                             Pertinent Vitals/Pain Pain Assessment: Faces Faces Pain Scale: Hurts even more Pain Location: bil LEs Pain Descriptors / Indicators: Grimacing;Guarding Pain Intervention(s): Limited activity within patient's tolerance;Monitored during session;Repositioned    Home Living Family/patient expects to be discharged to:: Private residence Living Arrangements: Spouse/significant other;Children Available Help at Discharge: Family;Available PRN/intermittently (wife works full time, pt worked PT 2 x week one week and 4 x week the next week) Type of Home: House Home Access: Level entry     Home Layout: One level [place]: Clinical cytogeneticist - 2 wheels      Prior Function Level of Independence: Independent               Hand Dominance  Extremity/Trunk Assessment   Upper Extremity Assessment Upper Extremity Assessment: RUE deficits/detail RUE Deficits / Details: pt with previous injury to hand, can grip RW but weakness noted    Lower Extremity Assessment Lower Extremity Assessment: RLE deficits/detail;LLE deficits/detail RLE Deficits /  Details: grossly 3+/5 LLE Deficits / Details: grossly 3+/5       Communication   Communication: No difficulties  Cognition Arousal/Alertness: Awake/alert Behavior During Therapy: WFL for tasks assessed/performed Overall Cognitive Status: Within Functional Limits for tasks assessed                                        General Comments General comments (skin integrity, edema, etc.): BP soft on arrival at 78/47 but nurse states this is pts baseline.  At end of treatment BP 89/69.  Other VSS, was on Bipap on arrrival as he was resting but did not need O2 with activity.    Exercises General Exercises - Lower Extremity Ankle Circles/Pumps: AROM;Both;10 reps;Seated Long Arc Quad: AROM;Both;10 reps;Seated   Assessment/Plan    PT Assessment Patient needs continued PT services  PT Problem List Decreased activity tolerance;Decreased balance;Decreased mobility;Decreased knowledge of use of DME;Decreased safety awareness;Decreased knowledge of precautions;Cardiopulmonary status limiting activity       PT Treatment Interventions DME instruction;Gait training;Functional mobility training;Therapeutic activities;Therapeutic exercise;Balance training;Patient/family education    PT Goals (Current goals can be found in the Care Plan section)  Acute Rehab PT Goals Patient Stated Goal: to go home PT Goal Formulation: With patient Time For Goal Achievement: 04/17/21 Potential to Achieve Goals: Good    Frequency Min 3X/week   Barriers to discharge Decreased caregiver support      Co-evaluation               AM-PAC PT "6 Clicks" Mobility  Outcome Measure Help needed turning from your back to your side while in a flat bed without using bedrails?: A Little Help needed moving from lying on your back to sitting on the side of a flat bed without using bedrails?: A Little Help needed moving to and from a bed to a chair (including a wheelchair)?: A Little Help needed  standing up from a chair using your arms (e.g., wheelchair or bedside chair)?: A Little Help needed to walk in hospital room?: A Little Help needed climbing 3-5 steps with a railing? : A Little 6 Click Score: 18    End of Session Equipment Utilized During Treatment: Gait belt Activity Tolerance: Patient limited by fatigue Patient left: in chair;with call bell/phone within reach;with chair alarm set Nurse Communication: Mobility status PT Visit Diagnosis: Unsteadiness on feet (R26.81);Muscle weakness (generalized) (M62.81)    Time: [id number] PT Time Calculation (min) (ACUTE ONLY): 18 min   Charges:   PT Evaluation $PT Eval Moderate Complexity: 1 Mod          Deshia Vanderhoof M,PT Acute Rehab Services [phone] (pager)  Alvira Philips 04/03/2021, 3:27 PM

## 2021-04-03 NOTE — Progress Notes (Signed)
NAME:  [name], MRN:  [id number], DOB:  07-11-1966, LOS: 4 ADMISSION DATE:  03/30/2021, CONSULTATION DATE:  04/03/21 REFERRING MD: Dr. Lamont Snowball, [facility] CHIEF COMPLAINT:  UGIB   History of Present Illness:  55 yo with with CAD s/p stent recently, A fib on eliquis presented to [facility] with syncope.  Found to have hematemesis with hemorrhagic shock and Hb 4.6.  Noted to also have seizure like activity.  Started on protonix infusion, required intubation for airway protection, and transferred [facility] for further management.  Pertinent  Medical History  ESRD s/p renal transplant on prednisone and prograf, CAD s/p stent, A fib on eliquis, HTN, Diastolic CHF, HLD, OSA, Pneumonia  Significant Hospital Events:  . 5/16 Admit to [facility], episodes of hematemesis and seizure, transfer to [facility].  Transfused 3 units PRBC's . 5/17 EGD . 5/18 Extubate  Tests:   CT head 5/16 >> normal  MRI brain 5/17 >> unremarkable  EEG 5/17 >> continuous slowing, excess beta  EGD 5/17 >> single actively bleeding angioectasia in duodema  04/02/2021 successfully excavated awake alert no acute distress D intensifying care  04/03/2021 transfuse 1 unit packed cells  Interim History / Subjective:  Continues to have low blood pressure which is chronic for him. Objective   Blood pressure (!) 79/44, pulse 63, temperature 98.3 F (36.8 C), temperature source Oral, resp. rate (!) 34, height '6\' 1"'$  (1.854 m), weight 107 kg, SpO2 97 %.        Intake/Output Summary (Last 24 hours) at 04/03/2021 0936 Last data filed at 04/03/2021 0800 Gross per 24 hour  Intake 761.49 ml  Output 982 ml  Net -220.51 ml   Filed Weights   03/31/21 0336 04/02/21 1430 04/02/21 1825  Weight: 101.9 kg 108 kg 107 kg   Physical Exam: General: Obese male no acute distress HEENT: MM pink/moist no JVD lymphadenopathy is appreciated Neuro: Grossly intact without focal defect CV: Heart sounds are regular PULM: Diminished breath sounds in the  bases GI: soft, bsx4 active  Extremities: warm/dry, 1+ edema  Skin: no rashes or lesions     Resolved Hospital Problem list     Assessment & Plan:   Compromised airway in setting of Upper GI bleeding and seizure. Extubating 04/02/2019 Utilizing outpatient CPAP  Hemorrhagic shock in setting of Upper GI bleeding present on admission. Recent Labs    04/02/21 0308 04/03/21 0417  HGB 7.3* 7.0*    Will transfuse 04/03/2021  Upper GI bleeding with angioectasia in duodenum present on admission. GI has signed off If recurrent GI bleed will need interventional radiology with embolization     New onset seizure in setting of shock and cerebral hypoperfusion state. Appreciate neurology's input No further seizure activity is noted   ESRD s/p renal transplant. Hyperkalemia. Recent Labs  Lab 04/01/21 1350 04/02/21 0307 04/03/21 0417  K 4.9 4.6 3.6   Appreciate nephrology's input Day 1 of midodrine Try off pressors Once off pressors can remove femoral central line and left femoral      CAD s/p recent stenting. A fib present prior to admission. Chronic diastolic CHF, HTN, HLD.  Appreciate cardiology's input Continue aspirin Continue amiodarone Lipitor Holding Eliquis due to GI bleeding        Best practice (right click and "Reselect all SmartList Selections" daily)  Diet: Sips with meds DVT prophylaxis: SCDs GI prophylaxis: Protonix Central venous access:  Yes, and it is still needed Mobility:  bed rest  Last date of multidisciplinary goals of care discussion [  pending] Code Status:  full code Disposition: ICU  Labs    CMP Latest Ref Rng & Units 04/03/2021 04/02/2021 04/01/2021  Glucose 70 - 99 mg/dL 100(H) 102(H) 101(H)  BUN 6 - 20 mg/dL 60(H) 139(H) 131(H)  Creatinine 0.61 - 1.24 mg/dL 7.25(H) 11.42(H) 10.36(H)  Sodium 135 - 145 mmol/L 135 134(L) 133(L)  Potassium 3.5 - 5.1 mmol/L 3.6 4.6 4.9  Chloride 98 - 111 mmol/L 99 96(L) 96(L)  CO2 22 - 32  mmol/L 27 21(L) 22  Calcium 8.9 - 10.3 mg/dL 6.6(L) 6.5(L) 6.6(L)  Total Protein 6.5 - 8.1 g/dL - - -  Total Bilirubin 0.3 - 1.2 mg/dL - - -  Alkaline Phos 38 - 126 U/L - - -  AST 15 - 41 U/L - - -  ALT 17 - 63 U/L - - -    CBC Latest Ref Rng & Units 04/03/2021 04/02/2021 04/01/2021  WBC 4.0 - 10.5 K/uL 15.5(H) 17.7(H) -  Hemoglobin 13.0 - 17.0 g/dL 7.0(L) 7.3(L) 6.8(LL)  Hematocrit 39.0 - 52.0 % 21.8(L) 22.3(L) 20.5(L)  Platelets 150 - 400 K/uL 196 171 -    ABG    Component Value Date/Time   PHART 7.459 (H) 03/31/2021 0953   PCO2ART 37.3 03/31/2021 0953   PO2ART 132 (H) 03/31/2021 0953   HCO3 26.5 03/31/2021 0953   TCO2 28 03/31/2021 0953   O2SAT 99.0 03/31/2021 0953    CBG (last 3)  Recent Labs    04/02/21 2328 04/03/21 0317 04/03/21 0817  GLUCAP 78 77 86    Critical care time: 30 minutes  Richardson Landry Elizebath Wever ACNP Acute Care Nurse Practitioner [place] Please consult Amion 04/03/2021, 9:36 AM

## 2021-04-04 ENCOUNTER — Inpatient Hospital Stay (HOSPITAL_COMMUNITY): Payer: Medicare Other

## 2021-04-04 DIAGNOSIS — J9601 Acute respiratory failure with hypoxia: Secondary | ICD-10-CM | POA: Diagnosis not present

## 2021-04-04 DIAGNOSIS — D649 Anemia, unspecified: Secondary | ICD-10-CM | POA: Diagnosis not present

## 2021-04-04 LAB — BPAM RBC
Blood Product Expiration Date: 202206082359
Blood Product Expiration Date: 202206082359
Blood Product Expiration Date: 202206152359
Blood Product Expiration Date: 202206172359
ISSUE DATE / TIME: 202205170613
ISSUE DATE / TIME: 202205170831
ISSUE DATE / TIME: 202205182219
ISSUE DATE / TIME: 202205201024
Unit Type and Rh: 5100
Unit Type and Rh: 5100
Unit Type and Rh: 5100
Unit Type and Rh: 5100

## 2021-04-04 LAB — TYPE AND SCREEN
ABO/RH(D): O POS
Antibody Screen: NEGATIVE
Unit division: 0
Unit division: 0
Unit division: 0
Unit division: 0

## 2021-04-04 LAB — CBC WITH DIFFERENTIAL/PLATELET
Abs Immature Granulocytes: 0.04 10*3/uL (ref 0.00–0.07)
Basophils Absolute: 0 10*3/uL (ref 0.0–0.1)
Basophils Relative: 0 %
Eosinophils Absolute: 0.5 10*3/uL (ref 0.0–0.5)
Eosinophils Relative: 4 %
HCT: 23.2 % — ABNORMAL LOW (ref 39.0–52.0)
Hemoglobin: 7.4 g/dL — ABNORMAL LOW (ref 13.0–17.0)
Immature Granulocytes: 0 %
Lymphocytes Relative: 10 %
Lymphs Abs: 1.3 10*3/uL (ref 0.7–4.0)
MCH: 30.2 pg (ref 26.0–34.0)
MCHC: 31.9 g/dL (ref 30.0–36.0)
MCV: 94.7 fL (ref 80.0–100.0)
Monocytes Absolute: 1 10*3/uL (ref 0.1–1.0)
Monocytes Relative: 8 %
Neutro Abs: 10.4 10*3/uL — ABNORMAL HIGH (ref 1.7–7.7)
Neutrophils Relative %: 78 %
Platelets: 169 10*3/uL (ref 150–400)
RBC: 2.45 MIL/uL — ABNORMAL LOW (ref 4.22–5.81)
RDW: 19 % — ABNORMAL HIGH (ref 11.5–15.5)
WBC: 13.2 10*3/uL — ABNORMAL HIGH (ref 4.0–10.5)
nRBC: 0 % (ref 0.0–0.2)

## 2021-04-04 LAB — CULTURE, BLOOD (ROUTINE X 2)
Culture: NO GROWTH
Special Requests: ADEQUATE

## 2021-04-04 LAB — PHOSPHORUS: Phosphorus: 5.4 mg/dL — ABNORMAL HIGH (ref 2.5–4.6)

## 2021-04-04 LAB — MAGNESIUM: Magnesium: 2.1 mg/dL (ref 1.7–2.4)

## 2021-04-04 MED ORDER — FINASTERIDE 5 MG PO TABS
5.0000 mg | ORAL_TABLET | Freq: Every day | ORAL | Status: DC
  Administered 2021-04-04 – 2021-04-07 (×4): 5 mg via ORAL
  Filled 2021-04-04 (×4): qty 1

## 2021-04-04 NOTE — Progress Notes (Signed)
   NAME:  [name], MRN:  [id number], DOB:  1966/09/02, LOS: 5 ADMISSION DATE:  03/30/2021, CONSULTATION DATE:  04/04/21 REFERRING MD: Dr. Lamont Snowball, [facility] CHIEF COMPLAINT:  UGIB   History of Present Illness:  55 yo with with CAD s/p stent recently, A fib on eliquis presented to [facility] with syncope.  Found to have hematemesis with hemorrhagic shock and Hb 4.6.  Noted to also have seizure like activity.  Started on protonix infusion, required intubation for airway protection, and transferred to [facility] for further management.  Pertinent  Medical History  ESRD s/p renal transplant on prednisone and prograf, CAD s/p stent, A fib on eliquis, HTN, Diastolic CHF, HLD, OSA, Pneumonia  Significant Hospital Events:  . 5/16 Admit to [facility], episodes of hematemesis and seizure, transfer to [facility].  Transfused 3 units PRBC's . 5/17 EGD . 5/18 Extubate  Tests:   CT head 5/16 >> normal  MRI brain 5/17 >> unremarkable  EEG 5/17 >> continuous slowing, excess beta  EGD 5/17 >> single actively bleeding angioectasia in duodema  04/02/2021 successfully excavated awake alert no acute distress D intensifying care  04/03/2021 transfuse 1 unit packed cells  Interim History / Subjective:  No events. Remains off levophed. MAPs in 60s  Objective   Blood pressure (!) 87/33, pulse (!) 59, temperature 97.8 F (36.6 C), temperature source Oral, resp. rate (!) 21, height '6\' 1"'$  (1.854 m), weight 107 kg, SpO2 (!) 80 %.        Intake/Output Summary (Last 24 hours) at 04/04/2021 0831 Last data filed at 04/03/2021 1700 Gross per 24 hour  Intake 455 ml  Output --  Net 455 ml   Filed Weights   03/31/21 0336 04/02/21 1430 04/02/21 1825  Weight: 101.9 kg 108 kg 107 kg   Physical Exam: General: Obese male no acute distress HEENT: MM pink/moist no JVD lymphadenopathy is appreciated Neuro: Grossly intact without focal defect CV: Heart sounds are regular, +SEM PULM: Diminished breath sounds in the bases GI: soft,  bsx4 active  Extremities: warm/dry, 1+ edema  Skin: no rashes or lesions  H/H 7.0>> 1 unit >> 7.4  Resolved Hospital Problem list    New onset seizure in setting of shock and cerebral hypoperfusion state. Appreciate neurology's input No further seizure activity is noted, no AEDs needed Compromised airway in setting of Upper GI bleeding and seizure. Extubating 04/02/2019 CPAP qHS (PTA) Hemorrhagic shock in setting of Upper GI bleeding present on admission. 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 - GI has signed off - If recurrent GI bleed will need interventional radiology with embolization - Continue PPI  ESRD s/p renal transplant on steroids PTA Hyperkalemia. - iHD per nephrology - Continue PTA prednisone (along with low dose florinef), tacro  CAD s/p recent stenting. A fib present prior to admission. Chronic diastolic CHF, HTN, HLD. Appreciate cardiology's input - Continue aspirin/plavix - Continue amiodarone Lipitor - Holding Eliquis indefinitely due to hemorraghic shock on admission and unclear source control  Ongoing vasoplegia- a bit better with midodrine and empiric florinef for presumed chronic adrenal insufficiency - MAP 60 is goal - Remove central line  Deconditioning- home health PT vs. May need initial 24h care, appreciate PT input  Okay for transfer to progressive, appreciate [facility] taking over care starting 5/22  Erskine Emery MD PCCM

## 2021-04-04 NOTE — Progress Notes (Signed)
Pt tx to 2C-10 via wheelchair... no complications... Report given to [facility].

## 2021-04-04 NOTE — Progress Notes (Signed)
Called 2C to give report but floor not ready... NS stated charge is off the floor but will have her call me when she gets back.

## 2021-04-04 NOTE — Progress Notes (Signed)
Nephrology Follow-Up Consult note   Assessment/Recommendations: [name] is a/an 55 y.o. male with a past medical history significant for HTN, HLD, CAD, CHF, afib, OSA, renal tx now back on HD, admitted for GI bleed.  [facility] [phone] MWF   3-1/2 hours EDW 97. HD Bath 2/2.5, Dialyzer 180, Heparin no. Access left aVF.  400 blood flow with 500 DFR-temperature 36.5 Epogen 1400 3 times weekly, Venofer 100 weekly.  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    Complicated by hemorrhagic shock requiring pressor support.  Now overall improved with stable hemoglobin. 2 Shock: Likely hemorrhagic/distributive with possible sepsis.  Now resolved with chronic hypotension as below 3 ESRD: Normally MWF at [facility] via aVF.    Hypotension has complicated dialysis.  Plan for dialysis again today then return to MWF schedule.  Tolerate systolic blood pressures of 80 on dialysis as his outpatient providers also do 4 hypotension: Has required midodrine in the past.  Started midodrine 10 mg 3 times daily this admission.  Minimally helpful currently 5. Anemia of ESRD:  Currently with GI bleed.  aranesp on board at 233mg qmonday 6. 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    Has been on sodium thiosulfate.  Holding for now given his acute illness.  Likely restart with dialysis on Monday   Recommendations conveyed to primary service.    [facility] 04/04/2021 8:43 AM  ___________________________________________________________  CC: ESRD, GI bleed  Interval History/Subjective: As well today with no complaints.  Hemoglobin stable.  Blood pressures low but stable.   Medications:  Current Facility-Administered Medications  Medication Dose Route Frequency Provider Last Rate Last Admin  . 0.9 %  sodium chloride infusion  100 mL  Intravenous PRN GCorliss Parish MD      . 0.9 %  sodium chloride infusion  100 mL Intravenous PRN GCorliss Parish MD      . 0.9 %  sodium chloride infusion  250 mL Intravenous Continuous MBarton Dubois MD      . 0.9 %  sodium chloride infusion  250 mL Intravenous PRN MBarton Dubois MD      . 0.9 %  sodium chloride infusion   Intravenous PRN AKipp Brood MD   Stopped at 04/03/21 [id number] . acetaminophen (TYLENOL) tablet 650 mg  650 mg Oral Q6H PRN SChesley Mires MD   650 mg at 04/03/21 1448   Or  . acetaminophen (TYLENOL) suppository 650 mg  650 mg Rectal Q4H PRN SChesley Mires MD      . amiodarone (PACERONE) tablet 200 mg  200 mg Oral Daily MBarton Dubois MD   200 mg at 04/03/21 0956  . aspirin EC tablet 81 mg  81 mg Oral Daily TDimple Nanas RPH   81 mg at 04/03/21 [id number] . atorvastatin (LIPITOR) tablet 80 mg  80 mg Oral Daily SChesley Mires MD   80 mg at 04/03/21 0956  . Chlorhexidine Gluconate Cloth 2 % PADS 6 each  6 each Topical Q0600 GCorliss Parish MD   6 each at 04/04/21 0522  . clopidogrel (PLAVIX) tablet 75 mg  75 mg Oral Daily SChesley Mires MD   75 mg at 04/03/21 0956  . Darbepoetin Alfa (ARANESP) injection 200 mcg  200 mcg Intravenous Q Mon-HD GCorliss Parish MD   200 mcg at 03/30/21 1642  . docusate sodium (COLACE) capsule 100  mg  100 mg Oral Daily PRN Minor, Grace Bushy, NP   100 mg at 04/03/21 0955  . febuxostat (ULORIC) tablet 40 mg  40 mg Oral Daily Barton Dubois, MD   40 mg at 04/03/21 0956  . fludrocortisone (FLORINEF) tablet 0.1 mg  0.1 mg Oral Daily Candee Furbish, MD      . lidocaine (PF) (XYLOCAINE) 1 % injection 5 mL  5 mL Intradermal PRN Corliss Parish, MD      . lidocaine-prilocaine (EMLA) cream 1 application  1 application Topical PRN Corliss Parish, MD      . LORazepam (ATIVAN) injection 2 mg  2 mg Intravenous Q5 min PRN Emokpae, Ejiroghene E, MD      . midodrine (PROAMATINE) tablet 15 mg  15 mg Oral TID WC Candee Furbish, MD    15 mg at 04/04/21 [id number]  . multivitamin with minerals tablet 1 tablet  1 tablet Oral Daily Barton Dubois, MD   1 tablet at 04/03/21 [phone]  . mupirocin ointment (BACTROBAN) 2 % 1 application  1 application Nasal BID Barton Dubois, MD   1 application at [phone]  . ondansetron (ZOFRAN) tablet 4 mg  4 mg Oral Q6H PRN Barton Dubois, MD       Or  . ondansetron [facility] injection 4 mg  4 mg Intravenous Q6H PRN Barton Dubois, MD      . pantoprazole (PROTONIX) EC tablet 40 mg  40 mg Oral BID Vena Rua, PA-C   40 mg at 04/03/21 2207  . pentafluoroprop-tetrafluoroeth (GEBAUERS) aerosol 1 application  1 application Topical PRN Corliss Parish, MD      . predniSONE (DELTASONE) tablet 10 mg  10 mg Oral Q breakfast Chesley Mires, MD   10 mg at 04/04/21 0810  . sodium chloride flush (NS) 0.9 % injection 10-40 mL  10-40 mL Intracatheter Q12H Aviva Signs, MD   10 mL at 04/03/21 2208  . sodium chloride flush (NS) 0.9 % injection 10-40 mL  10-40 mL Intracatheter PRN Aviva Signs, MD      . sodium chloride flush (NS) 0.9 % injection 3 mL  3 mL Intravenous Q12H Barton Dubois, MD   3 mL at 04/03/21 2209  . sodium chloride flush (NS) 0.9 % injection 3 mL  3 mL Intravenous PRN Barton Dubois, MD      . tacrolimus (PROGRAF) capsule 0.5 mg  0.5 mg Oral Daily Halford Chessman, Vineet, MD   0.5 mg at 04/03/21 1310  . tamsulosin (FLOMAX) capsule 0.4 mg  0.4 mg Oral Daily Minor, Grace Bushy, NP   0.4 mg at 04/03/21 1653      Review of Systems: 10 systems reviewed and negative except per HPI  Physical Exam: Vitals:   04/04/21 0700 04/04/21 0746  BP: (!) 87/33   Pulse: (!) 59   Resp: (!) 21   Temp:  97.8 F (36.6 C)  SpO2: (!) 80%    No intake/output data recorded.  Intake/Output Summary (Last 24 hours) at 04/04/2021 0843 Last data filed at 04/03/2021 1700 Gross per 24 hour  Intake 455 ml  Output --  Net 455 ml   Constitutional: Sitting in bed without distress ENMT: ears and nose without scars or  lesions, MMM CV: normal rate Respiratory: No increased work of breathing, bilateral chest rise Gastrointestinal: soft, non-tender, no palpable masses or hernias Skin: no visible lesions or rashes Psych: Interactive, appropriate mood and affect   Test Results I personally reviewed new and old clinical labs and radiology  tests Lab Results  Component Value Date   NA 135 04/03/2021   K 3.6 04/03/2021   CL 99 04/03/2021   CO2 27 04/03/2021   BUN 60 (H) 04/03/2021   CREATININE 7.25 (H) 04/03/2021   CALCIUM 6.6 (L) 04/03/2021   ALBUMIN 2.3 (L) 04/02/2021   PHOS 5.4 (H) 04/04/2021

## 2021-04-05 DIAGNOSIS — D649 Anemia, unspecified: Secondary | ICD-10-CM | POA: Diagnosis not present

## 2021-04-05 DIAGNOSIS — K922 Gastrointestinal hemorrhage, unspecified: Secondary | ICD-10-CM | POA: Diagnosis not present

## 2021-04-05 DIAGNOSIS — K31811 Angiodysplasia of stomach and duodenum with bleeding: Secondary | ICD-10-CM | POA: Diagnosis not present

## 2021-04-05 DIAGNOSIS — N186 End stage renal disease: Secondary | ICD-10-CM | POA: Diagnosis not present

## 2021-04-05 LAB — BASIC METABOLIC PANEL
Anion gap: 10 (ref 5–15)
BUN: 37 mg/dL — ABNORMAL HIGH (ref 6–20)
CO2: 27 mmol/L (ref 22–32)
Calcium: 6.7 mg/dL — ABNORMAL LOW (ref 8.9–10.3)
Chloride: 98 mmol/L (ref 98–111)
Creatinine, Ser: 5.86 mg/dL — ABNORMAL HIGH (ref 0.61–1.24)
GFR, Estimated: 11 mL/min — ABNORMAL LOW (ref >60)
Glucose, Bld: 84 mg/dL (ref 70–99)
Potassium: 2.9 mmol/L — ABNORMAL LOW (ref 3.5–5.1)
Sodium: 135 mmol/L (ref 135–145)

## 2021-04-05 LAB — CBC
HCT: 24.7 % — ABNORMAL LOW (ref 39.0–52.0)
Hemoglobin: 7.9 g/dL — ABNORMAL LOW (ref 13.0–17.0)
MCH: 30 pg (ref 26.0–34.0)
MCHC: 32 g/dL (ref 30.0–36.0)
MCV: 93.9 fL (ref 80.0–100.0)
Platelets: 221 10*3/uL (ref 150–400)
RBC: 2.63 MIL/uL — ABNORMAL LOW (ref 4.22–5.81)
RDW: 18.5 % — ABNORMAL HIGH (ref 11.5–15.5)
WBC: 15.1 10*3/uL — ABNORMAL HIGH (ref 4.0–10.5)
nRBC: 0 % (ref 0.0–0.2)

## 2021-04-05 LAB — CULTURE, BLOOD (ROUTINE X 2)
Culture: NO GROWTH
Special Requests: ADEQUATE

## 2021-04-05 MED ORDER — POTASSIUM CHLORIDE CRYS ER 20 MEQ PO TBCR
20.0000 meq | EXTENDED_RELEASE_TABLET | Freq: Once | ORAL | Status: AC
  Administered 2021-04-05: 20 meq via ORAL
  Filled 2021-04-05: qty 1

## 2021-04-05 MED ORDER — SODIUM THIOSULFATE 250 MG/ML IV SOLN
25.0000 g | INTRAVENOUS | Status: DC
  Administered 2021-04-06: 25 g via INTRAVENOUS
  Filled 2021-04-05 (×2): qty 100

## 2021-04-05 NOTE — Progress Notes (Signed)
Pt has scant blood from penis periodically, he says it is from Friday, informed to MD, ordered to just keep monitoring this time  Palma Holter, [place]

## 2021-04-05 NOTE — Progress Notes (Signed)
Nephrology Follow-Up Consult note   Assessment/Recommendations: [name] is a/an 55 y.o. male with a past medical history significant for HTN, HLD, CAD, CHF, afib, OSA, renal tx now back on HD, admitted for GI bleed.  [facility] [phone] MWF   3-1/2 hours EDW 97. HD Bath 2/2.5, Dialyzer 180, Heparin no. Access left aVF.  400 blood flow with 500 DFR-temperature 36.5 Epogen 1400 3 times weekly, Venofer 100 weekly.  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    Complicated by hemorrhagic shock requiring pressor support.  Now overall improved with stable hemoglobin. 2 Shock: Likely hemorrhagic/distributive with possible sepsis.  Now resolved with chronic hypotension as below 3 ESRD: Normally MWF at [facility] via aVF.    Hypotension has complicated dialysis.  Plan to resume MWF schedule starting tomorrow..  Tolerate systolic blood pressures of 80 on dialysis as his outpatient providers also do 4 hypotension: Has required midodrine in the past.  Continue for now. 5. Anemia of ESRD:  Currently with GI bleed.  aranesp on board at 265mg qmonday 6. 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    Has been on sodium thiosulfate.  Restart sodium thiosulfate with dialysis tomorrow given clinical improvement   Recommendations conveyed to primary service.    [facility] 04/05/2021 8:55 AM  ___________________________________________________________  CC: ESRD, GI bleed  Interval History/Subjective: Patient feels well this morning with no complaints.  Tolerated dialysis earlier this morning with some hypotension but did get about 600 cc removed.  Otherwise has no complaints   Medications:  Current Facility-Administered Medications  Medication Dose Route Frequency Provider Last Rate Last Admin  . 0.9 %  sodium chloride infusion   250 mL Intravenous Continuous MBarton Dubois MD      . 0.9 %  sodium chloride infusion  250 mL Intravenous PRN MBarton Dubois MD      . 0.9 %  sodium chloride infusion   Intravenous PRN AKipp Brood MD   Stopped at 04/03/21 [id number] . acetaminophen (TYLENOL) tablet 650 mg  650 mg Oral Q6H PRN SChesley Mires MD   650 mg at 04/04/21 2115   Or  . acetaminophen (TYLENOL) suppository 650 mg  650 mg Rectal Q4H PRN SChesley Mires MD      . amiodarone (PACERONE) tablet 200 mg  200 mg Oral Daily MBarton Dubois MD   200 mg at 04/05/21 0850  . aspirin EC tablet 81 mg  81 mg Oral Daily TDimple Nanas RPH   81 mg at 04/05/21 0850  . atorvastatin (LIPITOR) tablet 80 mg  80 mg Oral Daily SChesley Mires MD   80 mg at 04/05/21 0851  . Chlorhexidine Gluconate Cloth 2 % PADS 6 each  6 each Topical Q0600 GCorliss Parish MD   6 each at 04/05/21 0600  . clopidogrel (PLAVIX) tablet 75 mg  75 mg Oral Daily SChesley Mires MD   75 mg at 04/05/21 0850  . Darbepoetin Alfa (ARANESP) injection 200 mcg  200 mcg Intravenous Q Mon-HD GCorliss Parish MD   200 mcg at 03/30/21 1642  . docusate sodium (COLACE) capsule 100 mg  100 mg Oral Daily PRN Minor, WGrace Bushy NP   100 mg at 04/03/21 0955  . febuxostat (ULORIC) tablet 40 mg  40 mg Oral Daily MBarton Dubois MD   40 mg at 04/05/21 0851  . finasteride (PROSCAR)  tablet 5 mg  5 mg Oral Daily Candee Furbish, MD   5 mg at 04/05/21 0850  . fludrocortisone (FLORINEF) tablet 0.1 mg  0.1 mg Oral Daily Candee Furbish, MD   0.1 mg at 04/04/21 1000  . midodrine (PROAMATINE) tablet 15 mg  15 mg Oral TID WC Candee Furbish, MD   15 mg at 04/05/21 0850  . multivitamin with minerals tablet 1 tablet  1 tablet Oral Daily Barton Dubois, MD   1 tablet at 04/05/21 [phone]  . ondansetron (ZOFRAN) tablet 4 mg  4 mg Oral Q6H PRN Barton Dubois, MD       Or  . ondansetron [facility]) injection 4 mg  4 mg Intravenous Q6H PRN Barton Dubois, MD      . pantoprazole (PROTONIX) EC tablet 40 mg   40 mg Oral BID Vena Rua, PA-C   40 mg at 04/05/21 0850  . predniSONE (DELTASONE) tablet 10 mg  10 mg Oral Q breakfast Chesley Mires, MD   10 mg at 04/05/21 0851  . sodium chloride flush (NS) 0.9 % injection 10-40 mL  10-40 mL Intracatheter Q12H Aviva Signs, MD   10 mL at 04/03/21 2208  . sodium chloride flush (NS) 0.9 % injection 10-40 mL  10-40 mL Intracatheter PRN Aviva Signs, MD      . sodium chloride flush (NS) 0.9 % injection 3 mL  3 mL Intravenous Q12H Barton Dubois, MD   3 mL at 04/04/21 2118  . sodium chloride flush (NS) 0.9 % injection 3 mL  3 mL Intravenous PRN Barton Dubois, MD      . Derrill Memo ON 04/06/2021] sodium thiosulfate 25 g in sodium chloride 0.9 % 200 mL Infusion for Calciphylaxis  25 g Intravenous Q M,W,F Reesa Chew, MD      . tacrolimus (PROGRAF) capsule 0.5 mg  0.5 mg Oral Daily Chesley Mires, MD   0.5 mg at 04/04/21 1214      Review of Systems: 10 systems reviewed and negative except per HPI  Physical Exam: Vitals:   04/05/21 0617 04/05/21 0800  BP: 116/70 113/72  Pulse: (!) 58 74  Resp: 12 17  Temp: 98.4 F (36.9 C) 98.4 F (36.9 C)  SpO2: 100% 99%   Total I/O In: 240 [P.O.:240] Out: 0   Intake/Output Summary (Last 24 hours) at 04/05/2021 0855 Last data filed at 04/05/2021 0800 Gross per 24 hour  Intake 240 ml  Output 598 ml  Net -358 ml   Constitutional: Lying in bed without distress ENMT: ears and nose without scars or lesions, MMM CV: normal rate Respiratory: No increased work of breathing, bilateral chest rise Gastrointestinal: soft, non-tender, no palpable masses or hernias Skin: no visible lesions or rashes Psych: Interactive, appropriate mood and affect   Test Results I personally reviewed new and old clinical labs and radiology tests Lab Results  Component Value Date   NA 135 04/05/2021   K 2.9 (L) 04/05/2021   CL 98 04/05/2021   CO2 27 04/05/2021   BUN 37 (H) 04/05/2021   CREATININE 5.86 (H) 04/05/2021   CALCIUM  6.7 (L) 04/05/2021   ALBUMIN 2.3 (L) 04/02/2021   PHOS 5.4 (H) 04/04/2021

## 2021-04-05 NOTE — Progress Notes (Signed)
Pt arrived to unit. Pt stable. Vitals completed.

## 2021-04-05 NOTE — Progress Notes (Signed)
PROGRESS NOTE    [name]  [id number] DOB: 1966/08/26 DOA: 03/30/2021 PCP: Gardiner Rhyme, MD    Chief Complaint  Patient presents with  . Loss of Consciousness    Brief Narrative:   55 yo with with CAD s/p stent recentlyESRD s/p renal transplant on prednisone and prograf, CAD s/p stent, A fib on eliquis, HTN, Diastolic CHF, HLD, OSA, Pneumonia, he is on Eliquis for A. fib presented to [facility] with syncope.  Found to have hematemesis with hemorrhagic shock and Hb 4.6.  Noted to also have seizure like activity.  Started on protonix infusion, required intubation for airway protection, and transferred to [facility] for further management.   5/16 Admit to [facility], episodes of hematemesis and seizure, transfer to [facility].  Transfused 3 units PRBC's  5/17 EGD  5/18 Extubate  5/22 out of ICU to stepdown unit   Assessment & Plan:   Active Problems:   Severe anemia   Acute upper GI bleed   ESRD on dialysis ([place])   Hypovolemic shock (HCC)   AVM (arteriovenous malformation) of duodenum, acquired with hemorrhage   S/P coronary artery stent placement   New onset seizure in setting of shock and cerebral hypoperfusion state. Appreciate neurology's input No further seizure activity is noted, no AEDs needed  Compromised airway in setting of Upper GI bleeding and seizure. Patient required emergent intubation in ED, extubated 04/01/2021  He is currently on room air  Obstructive sleep apnea CPAP qHS (PTA)  Hemorrhagic shock in setting of Upper GI bleeding present on admission. 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 -This is in the setting of multiple oral anticoagulants and bleeding angiectasia in the duodenum. - GI has signed off - If recurrent GI bleed will need  interventional radiology with embolization - Continue PPI  ESRD s/p renal transplant on steroids PTA Hyperkalemia. - iHD per nephrology - Continue PTA prednisone (along with low dose florinef), tacro  CAD s/p recent stenting. A fib present prior to admission. Chronic diastolic CHF, HTN, HLD. Appreciate cardiology's input - Patient underwent LAD PCI on 03/26/21 - Continue aspirin/ Prasugrel to plavix - Continue amiodarone Lipitor - Holding Eliquis indefinitely due to hemorraghic shock on admission and unclear source control  Hypotension - Multifactorial, in the setting of hemorrhagic shock, ESRD, and aplasia - a bit better with midodrine and empiric florinef for presumed chronic adrenal insufficiency - MAP 60 is goal - Remove central line  Deconditioning- home health PT vs. May need initial 24h care, appreciate PT input  DVT prophylaxis: SCD's Code Status: Full Family Communication: None at bedside Disposition:   Status is: Inpatient  Remains inpatient appropriate because:IV treatments appropriate due to intensity of illness or inability to take PO   Dispo: The patient is from: Home              Anticipated d/c is to: Home              Patient currently is not medically stable to d/c.   Difficult to place patient No       Consultants:   PCCM  Cardiology  GI  Cardiology   Procedures:   5/16 Admit to [facility], episodes of hematemesis and seizure, transfer to [facility].  Transfused 3 units PRBC's  5/17 EGD  5/18 Extubate    Subjective:  Patient denies any complaints today, but he does report poor night sleep as he did receive his dialysis overnight, this morning denies any dyspnea or chest pain  Objective: Vitals:   04/05/21 0547 04/05/21 0602 04/05/21 0617 04/05/21 0800  BP: (!) 95/54 106/71 116/70 113/72  Pulse: 60 60 (!) 58 74  Resp: '15 12 12 17  '$ Temp:   98.4 F (36.9 C) 98.4 F (36.9 C)  TempSrc:   Oral Oral  SpO2: 100% 100% 100% 99%   Weight:      Height:        Intake/Output Summary (Last 24 hours) at 04/05/2021 1043 Last data filed at 04/05/2021 0800 Gross per 24 hour  Intake 240 ml  Output 598 ml  Net -358 ml   Filed Weights   03/31/21 0336 04/02/21 1430 04/02/21 1825  Weight: 101.9 kg 108 kg 107 kg    Examination:  Awake Alert, Oriented X 3, No new F.N deficits, Normal affect Symmetrical Chest wall movement, Good air movement bilaterally, CTAB RRR,No Gallops,Rubs or new Murmurs, No Parasternal Heave +ve B.Sounds, Abd Soft, No tenderness, No rebound - guarding or rigidity. No Cyanosis, Clubbing or edema, No new Rash or bruise      Data Reviewed: I have personally reviewed following labs and imaging studies  CBC: Recent Labs  Lab 03/30/21 0737 03/30/21 1851 03/31/21 0021 03/31/21 2121 04/01/21 0342 04/01/21 1350 04/02/21 0308 04/03/21 0417 04/03/21 1438 04/04/21 0401 04/05/21 0500  WBC 13.6* 25.7*   < > 21.5* 19.4*  --  17.7* 15.5*  --  13.2* 15.1*  NEUTROABS 9.8* 24.0*  --  18.9*  --   --   --   --   --  10.4*  --   HGB 4.6* 6.3*   < > 7.5* 7.2*   < > 7.3* 7.0* 7.6* 7.4* 7.9*  HCT 15.3* 19.2*   < > 22.3* 21.8*   < > 22.3* 21.8* 23.7* 23.2* 24.7*  MCV 101.3* 96.0   < > 91.8 92.4  --  90.7 93.2  --  94.7 93.9  PLT 183 184   < > 196 174  --  171 196  --  169 221   < > = values in this interval not displayed.    Basic Metabolic Panel: Recent Labs  Lab 03/30/21 0737 03/31/21 0434 04/01/21 0342 04/01/21 1350 04/02/21 0307 04/03/21 0417 04/04/21 0401 04/05/21 0500  NA 137   < > 131* 133* 134* 135  --  135  K 4.2   < > 5.3* 4.9 4.6 3.6  --  2.9*  CL 100   < > 95* 96* 96* 99  --  98  CO2 21*   < > 22 22 21* 27  --  27  GLUCOSE 107*   < > 141* 101* 102* 100*  --  84  BUN 182*   < > 121* 131* 139* 60*  --  37*  CREATININE 13.53*   < > 9.82* 10.36* 11.42* 7.25*  --  5.86*  CALCIUM 7.1*   < > 6.8* 6.6* 6.5* 6.6*  --  6.7*  MG 1.9  --  2.1  --   --  2.0 2.1  --   PHOS  --   --   --   --   6.0* 4.3 5.4*  --    < > = values in this interval not displayed.    GFR: Estimated Creatinine Clearance: 18.9 mL/min (A) (by C-G formula based on SCr of 5.86 mg/dL (  H)).  Liver Function Tests: Recent Labs  Lab 04/02/21 0307  ALBUMIN 2.3*    CBG: Recent Labs  Lab 04/02/21 1523 04/02/21 1937 04/02/21 2328 04/03/21 0317 04/03/21 0817  GLUCAP 82 82 78 77 86     Recent Results (from the past 240 hour(s))  Resp Panel by RT-PCR (Flu A&B, Covid) Nasopharyngeal Swab     Status: None   Collection Time: 03/30/21  7:35 AM   Specimen: Nasopharyngeal Swab; Nasopharyngeal(NP) swabs in vial transport medium  Result Value Ref Range Status   SARS Coronavirus 2 by RT PCR NEGATIVE NEGATIVE Final    Comment: (NOTE) SARS-CoV-2 target nucleic acids are NOT DETECTED.  The SARS-CoV-2 RNA is generally detectable in upper respiratory specimens during the acute phase of infection. The lowest concentration of SARS-CoV-2 viral copies this assay can detect is 138 copies/mL. A negative result does not preclude SARS-Cov-2 infection and should not be used as the sole basis for treatment or other patient management decisions. A negative result may occur with  improper specimen collection/handling, submission of specimen other than nasopharyngeal swab, presence of viral mutation(s) within the areas targeted by this assay, and inadequate number of viral copies(<138 copies/mL). A negative result must be combined with clinical observations, patient history, and epidemiological information. The expected result is Negative.  Fact Sheet for Patients:  EntrepreneurPulse.com.au  Fact Sheet for Healthcare Providers:  IncredibleEmployment.be  This test is no t yet approved or cleared by the [place] FDA and  has been authorized for detection and/or diagnosis of SARS-CoV-2 by FDA under an Emergency Use Authorization (EUA). This EUA will remain  in effect (meaning  this test can be used) for the duration of the COVID-19 declaration under Section 564(b)(1) of the Act, 21 U.S.C.section 360bbb-3(b)(1), unless the authorization is terminated  or revoked sooner.       Influenza A by PCR NEGATIVE NEGATIVE Final   Influenza B by PCR NEGATIVE NEGATIVE Final    Comment: (NOTE) The Xpert Xpress SARS-CoV-2/FLU/RSV plus assay is intended as an aid in the diagnosis of influenza from Nasopharyngeal swab specimens and should not be used as a sole basis for treatment. Nasal washings and aspirates are unacceptable for Xpert Xpress SARS-CoV-2/FLU/RSV testing.  Fact Sheet for Patients: EntrepreneurPulse.com.au  Fact Sheet for Healthcare Providers: IncredibleEmployment.be  This test is not yet approved or cleared by the [place] FDA and has been authorized for detection and/or diagnosis of SARS-CoV-2 by FDA under an Emergency Use Authorization (EUA). This EUA will remain in effect (meaning this test can be used) for the duration of the COVID-19 declaration under Section 564(b)(1) of the Act, 21 U.S.C. section 360bbb-3(b)(1), unless the authorization is terminated or revoked.  Performed at [facility], [address], [place], [place] [address]   Culture, blood (routine x 2)     Status: None   Collection Time: 03/30/21  8:33 AM   Specimen: BLOOD RIGHT ARM  Result Value Ref Range Status   Specimen Description BLOOD RIGHT ARM  Final   Special Requests   Final    BOTTLES DRAWN AEROBIC AND ANAEROBIC Blood Culture adequate volume   Culture   Final    NO GROWTH 5 DAYS Performed at [facility], [address], [place], [place] [address]    Report Status 04/04/2021 FINAL  Final  Culture, blood (routine x 2)     Status: None   Collection Time: 03/30/21 10:38 AM   Specimen: BLOOD  Result Value Ref Range Status   Specimen Description  BLOOD  Final   Special Requests NONE  Final   Culture   Final    NO GROWTH 5  DAYS Performed at [facility], [address] [place], [place] [address]    Report Status 04/04/2021 FINAL  Final  MRSA PCR Screening     Status: Abnormal   Collection Time: 03/30/21  7:03 PM   Specimen: Nasopharyngeal  Result Value Ref Range Status   MRSA by PCR POSITIVE (A) NEGATIVE Final    Comment:        The GeneXpert MRSA Assay (FDA approved for NASAL specimens only), is one component of a comprehensive MRSA colonization surveillance program. It is not intended to diagnose MRSA infection nor to guide or monitor treatment for MRSA infections. RESULT CALLED TO, READ BACK BY AND VERIFIED WITH: J HEARN,RN'@2106'$  03/30/21 [facility] Performed at [facility], [address], [place], [place] [address]          Radiology Studies: [facility] 1 View  Result Date: 04/04/2021 CLINICAL DATA:  Abnormal respirations. EXAM: PORTABLE CHEST 1 VIEW COMPARISON:  Apr 03, 2021 FINDINGS: No pneumothorax. Stable cardiomegaly. The hila and mediastinum are unchanged. Chronic pleural thickening and shrapnel seen on the right. Probable right loculated fluid also again identified. Mild opacity in left base may represent atelectasis or early infiltrate. No other acute abnormalities. IMPRESSION: 1. Mild opacity in left base may represent atelectasis or early infiltrate. Recommend attention on follow-up. 2. Probable right pleural effusion, unchanged. Chronic changes in the right base as above. Electronically Signed   By: Dorise Bullion III M.D   On: 04/04/2021 07:44        Scheduled Meds: . amiodarone  200 mg Oral Daily  . aspirin EC  81 mg Oral Daily  . atorvastatin  80 mg Oral Daily  . Chlorhexidine Gluconate Cloth  6 each Topical Q0600  . clopidogrel  75 mg Oral Daily  . darbepoetin (ARANESP) injection - DIALYSIS  200 mcg Intravenous Q Mon-HD  . febuxostat  40 mg Oral Daily  . finasteride  5 mg Oral Daily  . fludrocortisone  0.1 mg Oral Daily  . midodrine  15 mg Oral TID WC  . multivitamin  with minerals  1 tablet Oral Daily  . pantoprazole  40 mg Oral BID  . predniSONE  10 mg Oral Q breakfast  . sodium chloride flush  10-40 mL Intracatheter Q12H  . sodium chloride flush  3 mL Intravenous Q12H  . tacrolimus  0.5 mg Oral Daily   Continuous Infusions: . sodium chloride    . sodium chloride    . sodium chloride Stopped (04/03/21 0721)  . [START ON 04/06/2021] sodium thiosulfate infusion for calciphylaxis       LOS: 6 days     Phillips Climes, MD [facility]   To contact the attending provider between 7A-7P or the covering provider during after hours 7P-7A, please log into the web site [email or web address] and access using universal [place] password for that web site. If you do not have the password, please call the hospital operator.  04/05/2021, 10:43 AM

## 2021-04-06 DIAGNOSIS — Z955 Presence of coronary angioplasty implant and graft: Secondary | ICD-10-CM

## 2021-04-06 DIAGNOSIS — K31811 Angiodysplasia of stomach and duodenum with bleeding: Secondary | ICD-10-CM | POA: Diagnosis not present

## 2021-04-06 DIAGNOSIS — K922 Gastrointestinal hemorrhage, unspecified: Secondary | ICD-10-CM | POA: Diagnosis not present

## 2021-04-06 LAB — CBC
HCT: 24.3 % — ABNORMAL LOW (ref 39.0–52.0)
Hemoglobin: 7.7 g/dL — ABNORMAL LOW (ref 13.0–17.0)
MCH: 29.6 pg (ref 26.0–34.0)
MCHC: 31.7 g/dL (ref 30.0–36.0)
MCV: 93.5 fL (ref 80.0–100.0)
Platelets: 256 10*3/uL (ref 150–400)
RBC: 2.6 MIL/uL — ABNORMAL LOW (ref 4.22–5.81)
RDW: 18 % — ABNORMAL HIGH (ref 11.5–15.5)
WBC: 14.8 10*3/uL — ABNORMAL HIGH (ref 4.0–10.5)
nRBC: 0 % (ref 0.0–0.2)

## 2021-04-06 LAB — BASIC METABOLIC PANEL
Anion gap: 9 (ref 5–15)
BUN: 50 mg/dL — ABNORMAL HIGH (ref 6–20)
CO2: 26 mmol/L (ref 22–32)
Calcium: 6.1 mg/dL — CL (ref 8.9–10.3)
Chloride: 102 mmol/L (ref 98–111)
Creatinine, Ser: 9.08 mg/dL — ABNORMAL HIGH (ref 0.61–1.24)
GFR, Estimated: 6 mL/min — ABNORMAL LOW (ref >60)
Glucose, Bld: 91 mg/dL (ref 70–99)
Potassium: 4 mmol/L (ref 3.5–5.1)
Sodium: 137 mmol/L (ref 135–145)

## 2021-04-06 LAB — PREPARE RBC (CROSSMATCH)

## 2021-04-06 MED ORDER — DARBEPOETIN ALFA 100 MCG/0.5ML IJ SOSY
PREFILLED_SYRINGE | INTRAMUSCULAR | Status: AC
  Filled 2021-04-06: qty 1

## 2021-04-06 MED ORDER — CALCIUM GLUCONATE-NACL 1-0.675 GM/50ML-% IV SOLN
1.0000 g | Freq: Once | INTRAVENOUS | Status: AC
  Administered 2021-04-06: 1000 mg via INTRAVENOUS
  Filled 2021-04-06: qty 50

## 2021-04-06 MED ORDER — SODIUM CHLORIDE 0.9% IV SOLUTION: Freq: Once | INTRAVENOUS | Status: DC

## 2021-04-06 NOTE — Plan of Care (Signed)
  Problem: Education: Goal: Knowledge of General Education information will improve Description: Including pain rating scale, medication(s)/side effects and non-pharmacologic comfort measures Outcome: Progressing   Problem: Health Behavior/Discharge Planning: Goal: Ability to manage health-related needs will improve Outcome: Progressing   Problem: Nutrition: Goal: Adequate nutrition will be maintained Outcome: Progressing   Problem: Pain Managment: Goal: General experience of comfort will improve Outcome: Progressing   Problem: Skin Integrity: Goal: Risk for impaired skin integrity will decrease Outcome: Progressing   

## 2021-04-06 NOTE — Progress Notes (Signed)
Physical Therapy Treatment Patient Details Name: [name] MRN: [id number] DOB: 1966-01-17 Today's Date: 04/06/2021    History of Present Illness [name] is a/an 55 y.o. male admitted for GIB.  PMH: HLD, CAD, CHF, afib, OSA, renal tx now back on HD    PT Comments    Pt received in supine, agreeable to therapy session and with good participation in gait and seated exercise training. Pt given HEP handout for strengthening and encouraged to perform supine/seated exercises multiple times a day, especially on R knee as he tends to keep R knee in >15 degrees flexion at rest due to pain so emphasized quad sets/SAQ exercise. Encouraged him NOT to keep RLE with knee bent on pillow at rest. Pt continues to benefit from PT services to progress toward functional mobility goals. Continue to recommend HHPT.   Follow Up Recommendations  Home health PT;Supervision for mobility/OOB     Equipment Recommendations  None recommended by PT    Recommendations for Other Services       Precautions / Restrictions Precautions Precautions: Fall Restrictions Weight Bearing Restrictions: No    Mobility  Bed Mobility Overal bed mobility: Modified Independent Bed Mobility: Supine to Sit     Supine to sit: Modified independent (Device/Increase time)     General bed mobility comments: use of bed features/rails, no trunk assist this date although HOB elevated    Transfers Overall transfer level: Needs assistance Equipment used: Rolling walker (2 wheeled) Transfers: Sit to/from Stand Sit to Stand: Min assist         General transfer comment: from EOB<>RW multiple reps, pt needing minA to lower due to decreased eccentric control to sit, given cues for improved technique and HEP for strengthening  Ambulation/Gait Ambulation/Gait assistance: Min guard Gait Distance (Feet): 200 Feet Assistive device: Rolling walker (2 wheeled) Gait Pattern/deviations: Step-through pattern;Decreased stride  length;Antalgic     General Gait Details: no LOB, good use of RW with min cues for proximity, offered to lower RW height for improved UE support but pt defers and reports it is comfortable; no LOB, pt reporting he has been walking to bathrom without RW so may plan to assess balance without RW next session, reinforced use of device while RLE painful for safety due to antalgic gait; at times PWB/TWB due to pain   Stairs Stairs:  (pt defers, reports he won't have to do any)           Wheelchair Mobility    Modified Rankin (Stroke Patients Only)       Balance Overall balance assessment: Needs assistance Sitting-balance support: No upper extremity supported;Feet supported Sitting balance-Leahy Scale: Fair     Standing balance support: Bilateral upper extremity supported;During functional activity Standing balance-Leahy Scale: Fair Standing balance comment: static standing no AD no lob but used BUE support for gait trial due to leg pain                            Cognition Arousal/Alertness: Awake/alert Behavior During Therapy: WFL for tasks assessed/performed Overall Cognitive Status: Within Functional Limits for tasks assessed                                 General Comments: pleasantly cooperative      Exercises General Exercises - Lower Extremity Ankle Circles/Pumps: AROM;Both;10 reps;Seated Quad Sets: AROM;Both;10 reps;Supine Gluteal Sets: AROM;10 reps;Supine Short Arc Quad: AROM;Right;10 reps;Supine  Long Arc Quad: AROM;Both;10 reps;Seated Heel Slides: AROM;Right;5 reps;Supine    General Comments General comments (skin integrity, edema, etc.): BP 120/90 seated EOB, HR 63 bpm RR 12; post-exertion BP 134/119 (126) standing, BP assessment may be inaccurate due to small size of cuff, RN notified he may need another; HR 67 bpm after gait seated      Pertinent Vitals/Pain Pain Assessment: 0-10 Pain Score: 7  Pain Location: R calf Pain  Descriptors / Indicators: Grimacing;Guarding;Discomfort Pain Intervention(s): Limited activity within patient's tolerance;Monitored during session;Repositioned    Home Living                      Prior Function            PT Goals (current goals can now be found in the care plan section) Acute Rehab PT Goals Patient Stated Goal: to go home PT Goal Formulation: With patient Time For Goal Achievement: 04/17/21 Potential to Achieve Goals: Good Progress towards PT goals: Progressing toward goals    Frequency    Min 3X/week      PT Plan Current plan remains appropriate    Co-evaluation              AM-PAC PT "6 Clicks" Mobility   Outcome Measure  Help needed turning from your back to your side while in a flat bed without using bedrails?: None Help needed moving from lying on your back to sitting on the side of a flat bed without using bedrails?: None Help needed moving to and from a bed to a chair (including a wheelchair)?: A Little Help needed standing up from a chair using your arms (e.g., wheelchair or bedside chair)?: A Little Help needed to walk in hospital room?: A Little Help needed climbing 3-5 steps with a railing? : A Little 6 Click Score: 20    End of Session Equipment Utilized During Treatment: Gait belt Activity Tolerance: Patient tolerated treatment well Patient left: with call bell/phone within reach;in bed (pt seated EOB for lunch, per RN he will be leaving soon for HD so did not press issue for him to stay in chair.) Nurse Communication: Mobility status PT Visit Diagnosis: Unsteadiness on feet (R26.81);Muscle weakness (generalized) (M62.81)     Time: 1050-1108 PT Time Calculation (min) (ACUTE ONLY): 18 min  Charges:  $Therapeutic Exercise: 8-22 mins                     Elyzabeth Goatley P., PTA Acute Rehabilitation Services Pager: [phone] Office: [place] 04/06/2021, 2:08 PM

## 2021-04-06 NOTE — Procedures (Signed)
Seen and examined on dialysis.  Procedure supervised.  Blood pressure 132/87 and HR 62  Tolerating goal.  Just got PRBC's and BP lower prior.  Has albumin order in.  Left AVF in use.  Claudia Desanctis, MD 04/06/2021  1:56 PM

## 2021-04-06 NOTE — Progress Notes (Signed)
PROGRESS NOTE    [name]  [id number] DOB: 05-Oct-1966 DOA: 03/30/2021 PCP: Gardiner Rhyme, MD    Chief Complaint  Patient presents with  . Loss of Consciousness    Brief Narrative:   55 yo with with CAD s/p stent recentlyESRD s/p renal transplant on prednisone and prograf, CAD s/p stent, A fib on eliquis, HTN, Diastolic CHF, HLD, OSA, Pneumonia, he is on Eliquis for A. fib presented to [facility] with syncope.  Found to have hematemesis with hemorrhagic shock and Hb 4.6.  Noted to also have seizure like activity.  Started on protonix infusion, required intubation for airway protection, and transferred to [facility] for further management.   5/16 Admit to [facility], episodes of hematemesis and seizure, transfer to [facility].  Transfused 3 units PRBC's  5/17 EGD  5/18 Extubate  5/22 out of ICU to stepdown unit   Assessment & Plan:   Active Problems:   Severe anemia   Acute upper GI bleed   ESRD on dialysis ([place])   Hypovolemic shock (HCC)   AVM (arteriovenous malformation) of duodenum, acquired with hemorrhage   S/P coronary artery stent placement   New onset seizure in setting of shock and cerebral hypoperfusion state. Appreciate neurology's input No further seizure activity is noted, no AEDs needed  Compromised airway in setting of Upper GI bleeding and seizure. Patient required emergent intubation in ED, extubated 04/01/2021  He is currently on room air  Obstructive sleep apnea CPAP qHS (PTA)  Hemorrhagic shock in setting of Upper GI bleeding present on admission. -Required multiple PRBC transfusions, hemoglobin remained stable, last transfusion 5/20 -Remains with baseline anemia of chronic kidney disease/ESRD, Aranesp by renal every Monday - Daily CBC, overall hemoglobin remained stable, will transfuse 1 unit PRBC to keep hemoglobin> 8, especially his hemodialysis patient, and on dual antiplatelet therapy.  Upper GI bleeding with angioectasia in duodenum present on  admission. -This is in the setting of multiple oral anticoagulants and bleeding angiectasia in the duodenum. - GI has signed off - If recurrent GI bleed will need interventional radiology with embolization - Continue PPI -Eliquis remains on hold, will discuss with GI when it is appropriate to resume  ESRD s/p renal transplant on steroids PTA Hyperkalemia. - iHD per nephrology - Continue PTA prednisone (along with low dose florinef), tacro  CAD s/p recent stenting. A fib present prior to admission. Chronic diastolic CHF, HTN, HLD. Appreciate cardiology's input - Patient underwent LAD PCI on 03/26/21 - Continue aspirin/ Prasugrel to plavix - Continue amiodarone Lipitor - Holding Eliquis indefinitely due to hemorraghic shock on admission and unclear source control  Hypotension - Multifactorial, in the setting of hemorrhagic shock, ESRD, and aplasia - a bit better with midodrine and empiric florinef for presumed chronic adrenal insufficiency - MAP 60 is goal -Patient has tolerated systolic blood pressure in the 80s on dialysis at his outpatient provider per renal.  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 May need initial 24h care, appreciate PT input  DVT prophylaxis: SCD's Code Status: Full Family Communication: None at bedside Disposition:   Status is: Inpatient  Remains inpatient appropriate because:IV treatments appropriate due to intensity of illness or inability to take PO   Dispo: The patient is from: Home              Anticipated d/c is to: Home              Patient currently is not medically stable to d/c.  Difficult to place patient No       Consultants:   PCCM  Cardiology  GI  Cardiology   Procedures:   5/16 Admit to [facility], episodes of hematemesis and seizure, transfer to [facility].  Transfused 3 units PRBC's  5/17 EGD  5/18 Extubate    Subjective:  Patient denies any complaints today, reports good  night sleep, reports had couple bowel movements yesterday, but he did not look at the colors.  No chest pain, no shortness of breath, no nausea or vomiting  Objective: Vitals:   04/05/21 2337 04/06/21 0324 04/06/21 0722 04/06/21 1125  BP: (!) 96/56 113/70 108/74 109/89  Pulse: 63 60 63 65  Resp: '18 16 16 15  '$ Temp: 97.9 F (36.6 C) 98.6 F (37 C) 98.1 F (36.7 C) 98 F (36.7 C)  TempSrc: Oral Oral Oral Oral  SpO2: 91% 97% 98% 98%  Weight:      Height:        Intake/Output Summary (Last 24 hours) at 04/06/2021 1150 Last data filed at 04/06/2021 0324 Gross per 24 hour  Intake 4.5 ml  Output --  Net 4.5 ml   Filed Weights   03/31/21 0336 04/02/21 1430 04/02/21 1825  Weight: 101.9 kg 108 kg 107 kg    Examination:  Awake Alert, Oriented X 3, No new F.N deficits, Normal affect Symmetrical Chest wall movement, Good air movement bilaterally, CTAB RRR,No Gallops,Rubs or new Murmurs, No Parasternal Heave +ve B.Sounds, Abd Soft, No tenderness, No rebound - guarding or rigidity. No Cyanosis, Clubbing or edema, calciphylaxis lesions in lower extremities    Data Reviewed: I have personally reviewed following labs and imaging studies  CBC: Recent Labs  Lab 03/30/21 1851 03/31/21 0021 03/31/21 2121 04/01/21 0342 04/02/21 0308 04/03/21 0417 04/03/21 1438 04/04/21 0401 04/05/21 0500 04/06/21 0054  WBC 25.7*   < > 21.5*   < > 17.7* 15.5*  --  13.2* 15.1* 14.8*  NEUTROABS 24.0*  --  18.9*  --   --   --   --  10.4*  --   --   HGB 6.3*   < > 7.5*   < > 7.3* 7.0* 7.6* 7.4* 7.9* 7.7*  HCT 19.2*   < > 22.3*   < > 22.3* 21.8* 23.7* 23.2* 24.7* 24.3*  MCV 96.0   < > 91.8   < > 90.7 93.2  --  94.7 93.9 93.5  PLT 184   < > 196   < > 171 196  --  169 221 256   < > = values in this interval not displayed.    Basic Metabolic Panel: Recent Labs  Lab 04/01/21 0342 04/01/21 1350 04/02/21 0307 04/03/21 0417 04/04/21 0401 04/05/21 0500 04/06/21 0054  NA 131* 133* 134* 135  --  135  137  K 5.3* 4.9 4.6 3.6  --  2.9* 4.0  CL 95* 96* 96* 99  --  98 102  CO2 22 22 21* 27  --  27 26  GLUCOSE 141* 101* 102* 100*  --  84 91  BUN 121* 131* 139* 60*  --  37* 50*  CREATININE 9.82* 10.36* 11.42* 7.25*  --  5.86* 9.08*  CALCIUM 6.8* 6.6* 6.5* 6.6*  --  6.7* 6.1*  MG 2.1  --   --  2.0 2.1  --   --   PHOS  --   --  6.0* 4.3 5.4*  --   --     GFR: Estimated Creatinine Clearance: 12.2  mL/min (A) (by C-G formula based on SCr of 9.08 mg/dL (H)).  Liver Function Tests: Recent Labs  Lab 04/02/21 0307  ALBUMIN 2.3*    CBG: Recent Labs  Lab 04/02/21 1523 04/02/21 1937 04/02/21 2328 04/03/21 0317 04/03/21 0817  GLUCAP 82 82 78 77 86     Recent Results (from the past 240 hour(s))  Resp Panel by RT-PCR (Flu A&B, Covid) Nasopharyngeal Swab     Status: None   Collection Time: 03/30/21  7:35 AM   Specimen: Nasopharyngeal Swab; Nasopharyngeal(NP) swabs in vial transport medium  Result Value Ref Range Status   SARS Coronavirus 2 by RT PCR NEGATIVE NEGATIVE Final    Comment: (NOTE) SARS-CoV-2 target nucleic acids are NOT DETECTED.  The SARS-CoV-2 RNA is generally detectable in upper respiratory specimens during the acute phase of infection. The lowest concentration of SARS-CoV-2 viral copies this assay can detect is 138 copies/mL. A negative result does not preclude SARS-Cov-2 infection and should not be used as the sole basis for treatment or other patient management decisions. A negative result may occur with  improper specimen collection/handling, submission of specimen other than nasopharyngeal swab, presence of viral mutation(s) within the areas targeted by this assay, and inadequate number of viral copies(<138 copies/mL). A negative result must be combined with clinical observations, patient history, and epidemiological information. The expected result is Negative.  Fact Sheet for Patients:  EntrepreneurPulse.com.au  Fact Sheet for Healthcare  Providers:  IncredibleEmployment.be  This test is no t yet approved or cleared by the [place] FDA and  has been authorized for detection and/or diagnosis of SARS-CoV-2 by FDA under an Emergency Use Authorization (EUA). This EUA will remain  in effect (meaning this test can be used) for the duration of the COVID-19 declaration under Section 564(b)(1) of the Act, 21 U.S.C.section 360bbb-3(b)(1), unless the authorization is terminated  or revoked sooner.       Influenza A by PCR NEGATIVE NEGATIVE Final   Influenza B by PCR NEGATIVE NEGATIVE Final    Comment: (NOTE) The Xpert Xpress SARS-CoV-2/FLU/RSV plus assay is intended as an aid in the diagnosis of influenza from Nasopharyngeal swab specimens and should not be used as a sole basis for treatment. Nasal washings and aspirates are unacceptable for Xpert Xpress SARS-CoV-2/FLU/RSV testing.  Fact Sheet for Patients: EntrepreneurPulse.com.au  Fact Sheet for Healthcare Providers: IncredibleEmployment.be  This test is not yet approved or cleared by the [place] FDA and has been authorized for detection and/or diagnosis of SARS-CoV-2 by FDA under an Emergency Use Authorization (EUA). This EUA will remain in effect (meaning this test can be used) for the duration of the COVID-19 declaration under Section 564(b)(1) of the Act, 21 U.S.C. section 360bbb-3(b)(1), unless the authorization is terminated or revoked.  Performed at [facility], [address], [place], [place] [address]   Culture, blood (routine x 2)     Status: None   Collection Time: 03/30/21  8:33 AM   Specimen: BLOOD RIGHT ARM  Result Value Ref Range Status   Specimen Description BLOOD RIGHT ARM  Final   Special Requests   Final    BOTTLES DRAWN AEROBIC AND ANAEROBIC Blood Culture adequate volume   Culture   Final    NO GROWTH 5 DAYS Performed at [facility], [address], [place], [place] [address]     Report Status 04/04/2021 FINAL  Final  Culture, blood (routine x 2)     Status: None   Collection Time: 03/30/21 10:38 AM  Specimen: BLOOD RIGHT HAND  Result Value Ref Range Status   Specimen Description   Final    BLOOD RIGHT HAND BOTTLES DRAWN AEROBIC AND ANAEROBIC   Special Requests Blood Culture adequate volume  Final   Culture   Final    NO GROWTH 5 DAYS Performed at [facility], [address] [place], [place] [address]    Report Status 04/04/2021 FINAL  Final  MRSA PCR Screening     Status: Abnormal   Collection Time: 03/30/21  7:03 PM   Specimen: Nasopharyngeal  Result Value Ref Range Status   MRSA by PCR POSITIVE (A) NEGATIVE Final    Comment:        The GeneXpert MRSA Assay (FDA approved for NASAL specimens only), is one component of a comprehensive MRSA colonization surveillance program. It is not intended to diagnose MRSA infection nor to guide or monitor treatment for MRSA infections. RESULT CALLED TO, READ BACK BY AND VERIFIED WITH: J HEARN,RN'@2106'$  03/30/21 [facility] Performed at [facility], [address] [place], [place] [address]          Radiology Studies: No results found.      Scheduled Meds: . sodium chloride   Intravenous Once  . amiodarone  200 mg Oral Daily  . aspirin EC  81 mg Oral Daily  . atorvastatin  80 mg Oral Daily  . Chlorhexidine Gluconate Cloth  6 each Topical Q0600  . clopidogrel  75 mg Oral Daily  . darbepoetin (ARANESP) injection - DIALYSIS  200 mcg Intravenous Q Mon-HD  . febuxostat  40 mg Oral Daily  . finasteride  5 mg Oral Daily  . fludrocortisone  0.1 mg Oral Daily  . midodrine  15 mg Oral TID WC  . multivitamin with minerals  1 tablet Oral Daily  . pantoprazole  40 mg Oral BID  . predniSONE  10 mg Oral Q breakfast  . sodium chloride flush  10-40 mL Intracatheter Q12H  . sodium chloride flush  3 mL Intravenous Q12H  . tacrolimus  0.5 mg Oral Daily   Continuous Infusions: . sodium chloride    . sodium  chloride    . sodium chloride Stopped (04/03/21 0721)  . sodium thiosulfate infusion for calciphylaxis 25 g (04/06/21 1034)     LOS: 7 days     Phillips Climes, MD [facility]   To contact the attending provider between 7A-7P or the covering provider during after hours 7P-7A, please log into the web site [email or web address] and access using universal [place] password for that web site. If you do not have the password, please call the hospital operator.  04/06/2021, 11:50 AM

## 2021-04-06 NOTE — Progress Notes (Signed)
Notified by RN that calcium level low at 6.1. reviewing chart, pt has had low calcium everyday while in hospital and no calcium replacement. Corrected calcium is 7.5. Ionized calcium is low.  Given 1 gm Calcium gluconate now

## 2021-04-06 NOTE — Progress Notes (Signed)
Nephrology Follow-Up note   Assessment/Recommendations: [name] is a/an 55 y.o. male with a past medical history significant for HTN, HLD, CAD, CHF, afib, OSA, renal tx now back on HD, admitted for GI bleed.  [facility] [phone] MWF   3-1/2 hours EDW 97. HD Bath 2/2.5, Dialyzer 180, Heparin no. Access left aVF.  400 blood flow with 500 DFR-temperature 36.5 Epogen 1400 3 times weekly, Venofer 100 weekly.  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    Complicated by hemorrhagic shock requiring pressor support.    2 Shock: hx of.  Likely hemorrhagic/distributive with possible sepsis.  Now resolved with chronic hypotension as below  3 ESRD: Normally MWF at [facility] via aVF.    Hypotension has complicated dialysis.   - Continue HD per MWF schedule  - Per charting have tolerated systolic blood pressures of 80 on dialysis as his outpatient providers also do  4 Chronic hypotension:  Continue midodrine   5. Anemia of ESRD:  here with GI bleed. On aranesp 200 mcg every Monday   6. 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  hypocalcemia - did receive calcium gluconate on 5/23.   7. 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    Has been on sodium thiosulfate.     Disposition per primary team   ___________________________________________________________  CC: ESRD, GI bleed  Interval History/Subjective:  Last HD on 5/21 with 598 ml UF.  Feels ok this morning.  Confirms on pred 10 mg daily and prograf 0.5 mg daily for transplant.  Not sure when he's going home.     Review of systems:  Denies shortness of breath or chest pain  Denies n/v   Medications:  Current Facility-Administered Medications  Medication Dose Route Frequency Provider Last Rate Last Admin  . 0.9 %  sodium chloride infusion  250 mL Intravenous Continuous Barton Dubois, MD      . 0.9 %  sodium chloride infusion  250 mL Intravenous PRN Barton Dubois, MD      . 0.9 %  sodium chloride infusion   Intravenous PRN Kipp Brood, MD   Stopped at 04/03/21 [id number]  . acetaminophen (TYLENOL) tablet 650 mg  650 mg Oral Q6H PRN Chesley Mires, MD   650 mg at 04/05/21 0957   Or  . acetaminophen (TYLENOL) suppository 650 mg  650 mg Rectal Q4H PRN Chesley Mires, MD      . amiodarone (PACERONE) tablet 200 mg  200 mg Oral Daily Barton Dubois, MD   200 mg at 04/05/21 0850  . aspirin EC tablet 81 mg  81 mg Oral Daily Dimple Nanas, RPH   81 mg at 04/05/21 0850  . atorvastatin (LIPITOR) tablet 80 mg  80 mg Oral Daily Chesley Mires, MD   80 mg at 04/05/21 0851  . Chlorhexidine Gluconate Cloth 2 % PADS 6 each  6 each Topical Q0600 Corliss Parish, MD   6 each at 04/05/21 0600  . clopidogrel (PLAVIX) tablet 75 mg  75 mg Oral Daily Chesley Mires, MD   75 mg at 04/05/21 0850  . Darbepoetin Alfa (ARANESP) injection 200 mcg  200 mcg Intravenous Q Mon-HD Corliss Parish, MD   200 mcg at 03/30/21 1642  . docusate sodium (COLACE) capsule 100 mg  100 mg Oral Daily PRN Minor, Grace Bushy, NP   100 mg at 04/03/21  [id number]  . febuxostat (ULORIC) tablet 40 mg  40 mg Oral Daily Barton Dubois, MD   40 mg at 04/05/21 0851  . finasteride (PROSCAR) tablet 5 mg  5 mg Oral Daily Candee Furbish, MD   5 mg at 04/05/21 0850  . fludrocortisone (FLORINEF) tablet 0.1 mg  0.1 mg Oral Daily Candee Furbish, MD   0.1 mg at 04/05/21 0957  . midodrine (PROAMATINE) tablet 15 mg  15 mg Oral TID WC Candee Furbish, MD   15 mg at 04/05/21 1603  . multivitamin with minerals tablet 1 tablet  1 tablet Oral Daily Barton Dubois, MD   1 tablet at 04/05/21 [phone]  . ondansetron (ZOFRAN) tablet 4 mg  4 mg Oral Q6H PRN Barton Dubois, MD       Or  . ondansetron [facility]) injection 4 mg  4 mg Intravenous Q6H PRN Barton Dubois, MD      . pantoprazole (PROTONIX) EC tablet 40 mg  40 mg Oral BID Vena Rua,  PA-C   40 mg at 04/05/21 2142  . predniSONE (DELTASONE) tablet 10 mg  10 mg Oral Q breakfast Chesley Mires, MD   10 mg at 04/05/21 0851  . sodium chloride flush (NS) 0.9 % injection 10-40 mL  10-40 mL Intracatheter Q12H Aviva Signs, MD   10 mL at 04/05/21 0957  . sodium chloride flush (NS) 0.9 % injection 10-40 mL  10-40 mL Intracatheter PRN Aviva Signs, MD      . sodium chloride flush (NS) 0.9 % injection 3 mL  3 mL Intravenous Q12H Barton Dubois, MD   3 mL at 04/05/21 2142  . sodium chloride flush (NS) 0.9 % injection 3 mL  3 mL Intravenous PRN Barton Dubois, MD      . sodium thiosulfate 25 g in sodium chloride 0.9 % 200 mL Infusion for Calciphylaxis  25 g Intravenous Q M,W,F Reesa Chew, MD      . tacrolimus (PROGRAF) capsule 0.5 mg  0.5 mg Oral Daily Chesley Mires, MD   0.5 mg at 04/05/21 1208      Physical Exam: Vitals:   04/05/21 2337 04/06/21 0324  BP: (!) 96/56 113/70  Pulse: 63 60  Resp: 18 16  Temp: 97.9 F (36.6 C) 98.6 F (37 C)  SpO2: 91% 97%   Total I/O In: 4.5 [I.V.:3; IV Piggyback:1.5] Out: -   Intake/Output Summary (Last 24 hours) at 04/06/2021 [id number] Last data filed at 04/06/2021 0324 Gross per 24 hour  Intake 484.5 ml  Output 598 ml  Net -113.5 ml   General adult male in bed in no acute distress HEENT normocephalic atraumatic extraocular movements intact sclera anicteric Neck supple trachea midline Lungs clear to auscultation bilaterally normal work of breathing at rest  Heart S1S2 no rub Abdomen soft nontender nondistended Extremities no lower extremity edema  Psych normal mood and affect Neuro - alert and oriented x 3 provides hx and follows commands Access LUE AVF bruit and thrill   Test Results I personally reviewed new and old clinical labs and radiology tests Lab Results  Component Value Date   NA 137 04/06/2021   K 4.0 04/06/2021   CL 102 04/06/2021   CO2 26 04/06/2021   BUN 50 (H) 04/06/2021   CREATININE 9.08 (H) 04/06/2021    CALCIUM 6.1 (LL) 04/06/2021   ALBUMIN 2.3 (L) 04/02/2021   PHOS 5.4 (H) 04/04/2021     Claudia Desanctis, MD 04/06/2021 6:05 AM

## 2021-04-07 DIAGNOSIS — K31811 Angiodysplasia of stomach and duodenum with bleeding: Secondary | ICD-10-CM | POA: Diagnosis not present

## 2021-04-07 DIAGNOSIS — D649 Anemia, unspecified: Secondary | ICD-10-CM | POA: Diagnosis not present

## 2021-04-07 DIAGNOSIS — N186 End stage renal disease: Secondary | ICD-10-CM | POA: Diagnosis not present

## 2021-04-07 DIAGNOSIS — K922 Gastrointestinal hemorrhage, unspecified: Secondary | ICD-10-CM | POA: Diagnosis not present

## 2021-04-07 LAB — TYPE AND SCREEN
ABO/RH(D): O POS
Antibody Screen: NEGATIVE
Unit division: 0

## 2021-04-07 LAB — CBC
HCT: 26.9 % — ABNORMAL LOW (ref 39.0–52.0)
Hemoglobin: 8.6 g/dL — ABNORMAL LOW (ref 13.0–17.0)
MCH: 29.6 pg (ref 26.0–34.0)
MCHC: 32 g/dL (ref 30.0–36.0)
MCV: 92.4 fL (ref 80.0–100.0)
Platelets: 294 10*3/uL (ref 150–400)
RBC: 2.91 MIL/uL — ABNORMAL LOW (ref 4.22–5.81)
RDW: 17.2 % — ABNORMAL HIGH (ref 11.5–15.5)
WBC: 14 10*3/uL — ABNORMAL HIGH (ref 4.0–10.5)
nRBC: 0 % (ref 0.0–0.2)

## 2021-04-07 LAB — BPAM RBC
Blood Product Expiration Date: 202206252359
ISSUE DATE / TIME: 202205231253
Unit Type and Rh: 5100

## 2021-04-07 LAB — BASIC METABOLIC PANEL
Anion gap: 13 (ref 5–15)
BUN: 26 mg/dL — ABNORMAL HIGH (ref 6–20)
CO2: 26 mmol/L (ref 22–32)
Calcium: 7 mg/dL — ABNORMAL LOW (ref 8.9–10.3)
Chloride: 102 mmol/L (ref 98–111)
Creatinine, Ser: 6.67 mg/dL — ABNORMAL HIGH (ref 0.61–1.24)
GFR, Estimated: 9 mL/min — ABNORMAL LOW (ref >60)
Glucose, Bld: 78 mg/dL (ref 70–99)
Potassium: 3.5 mmol/L (ref 3.5–5.1)
Sodium: 141 mmol/L (ref 135–145)

## 2021-04-07 MED ORDER — COLLAGENASE 250 UNIT/GM EX OINT
TOPICAL_OINTMENT | Freq: Every day | CUTANEOUS | Status: DC
  Filled 2021-04-07: qty 30

## 2021-04-07 MED ORDER — ACETAMINOPHEN 325 MG PO TABS: 650.0000 mg | ORAL_TABLET | Freq: Four times a day (QID) | ORAL | Status: DC | PRN

## 2021-04-07 MED ORDER — SIMETHICONE 80 MG PO CHEW
160.0000 mg | CHEWABLE_TABLET | Freq: Once | ORAL | Status: AC
  Administered 2021-04-07: 160 mg via ORAL
  Filled 2021-04-07: qty 2

## 2021-04-07 MED ORDER — PANTOPRAZOLE SODIUM 40 MG PO TBEC: 40.0000 mg | DELAYED_RELEASE_TABLET | Freq: Two times a day (BID) | ORAL | 1 refills | Status: DC

## 2021-04-07 MED ORDER — CLOPIDOGREL BISULFATE 75 MG PO TABS: 75.0000 mg | ORAL_TABLET | Freq: Every day | ORAL | 1 refills | Status: DC

## 2021-04-07 MED ORDER — LOPERAMIDE HCL 2 MG PO CAPS: 2.0000 mg | ORAL_CAPSULE | ORAL | Status: DC | PRN

## 2021-04-07 MED ORDER — MIDODRINE HCL 5 MG PO TABS: 10.0000 mg | ORAL_TABLET | Freq: Three times a day (TID) | ORAL | 0 refills | Status: DC

## 2021-04-07 NOTE — Progress Notes (Signed)
Physical Therapy Treatment Patient Details Name: [name] MRN: [id number] DOB: 1966/03/23 Today's Date: 04/07/2021    History of Present Illness [name] is a/an 55 y.o. male admitted for GIB.  PMH: HLD, CAD, CHF, afib, OSA, renal tx now back on HD    PT Comments    Pt received in supine, agreeable to limited therapy session in room (lunch tray arrived and pt wanting to eat) and with fair participation and tolerance for mobility. Pt performed short gait tasks at bedside without AD, reporting pain is improved today but still demonstrates antalgic gait and decreased weight acceptance onto RLE. Encouraged him to use RW at home for pain reduction/safety. Pt agreeable to sit up in chair at end of session and RN notified pulse oximetry reading low at times with mobility but not getting a good signal. Pt continues to benefit from PT services to progress toward functional mobility goals. Continue to recommend HHPT.   Follow Up Recommendations  Home health PT;Supervision for mobility/OOB     Equipment Recommendations  None recommended by PT    Recommendations for Other Services       Precautions / Restrictions Precautions Precautions: Fall Restrictions Weight Bearing Restrictions: No    Mobility  Bed Mobility Overal bed mobility: Modified Independent Bed Mobility: Supine to Sit     Supine to sit: Modified independent (Device/Increase time)     General bed mobility comments: use of bed features/rails, no trunk assist this date although HOB elevated    Transfers Overall transfer level: Needs assistance Equipment used: None Transfers: Sit to/from Stand Sit to Stand: Supervision         General transfer comment: from EOB>toilet and toilet> standing no AD, then sat in chair, no LOB and fair safety awareness; deferring use of RW  Ambulation/Gait Ambulation/Gait assistance: Supervision Gait Distance (Feet): 30 Feet (62f x2 to/from bathroom) Assistive device:  None Gait Pattern/deviations: Step-through pattern;Decreased stride length;Antalgic;Decreased weight shift to right;Decreased step length - left Gait velocity: <0.4 m/s   General Gait Details: no LOB, antalgic gait pattern but refusing RW, fair weight acceptance through RLE   Stairs Stairs:  (pt given handout to reinforce RW use on stairs if he needs to in the future)           Wheelchair Mobility    Modified Rankin (Stroke Patients Only)       Balance Overall balance assessment: Needs assistance Sitting-balance support: No upper extremity supported;Feet supported Sitting balance-Leahy Scale: Fair     Standing balance support: Bilateral upper extremity supported;During functional activity Standing balance-Leahy Scale: Fair Standing balance comment: static and dynamic standing without RW no LOB                            Cognition Arousal/Alertness: Awake/alert Behavior During Therapy: WFL for tasks assessed/performed Overall Cognitive Status: Within Functional Limits for tasks assessed                                 General Comments: less cooperative as he is wanting to go home but participatory with encouragement; follows 1 and 2-step commands if he feels like it.      Exercises      General Comments General comments (skin integrity, edema, etc.): BP 115/59 (75) prior to OOB and pt deferring second BP check once in chair, wanting to eat and he reports no sxs dizziness while standing.  HR 85 bpm and SpO2 90-91% on RA at rest, RN notified SpO2 pleth signal not good during mobility and at times reading 87-88% may need new sensor.      Pertinent Vitals/Pain Pain Assessment: Faces Faces Pain Scale: Hurts little more Pain Location: R calf Pain Descriptors / Indicators: Guarding;Discomfort;Tender Pain Intervention(s): Limited activity within patient's tolerance;Monitored during session;Repositioned (pt defers pain meds despite antalgic gait)            PT Goals (current goals can now be found in the care plan section) Acute Rehab PT Goals Patient Stated Goal: to go home PT Goal Formulation: With patient Time For Goal Achievement: 04/17/21 Potential to Achieve Goals: Good Progress towards PT goals: Progressing toward goals    Frequency    Min 3X/week      PT Plan Current plan remains appropriate       AM-PAC PT "6 Clicks" Mobility   Outcome Measure  Help needed turning from your back to your side while in a flat bed without using bedrails?: None Help needed moving from lying on your back to sitting on the side of a flat bed without using bedrails?: None Help needed moving to and from a bed to a chair (including a wheelchair)?: A Little Help needed standing up from a chair using your arms (e.g., wheelchair or bedside chair)?: A Little Help needed to walk in hospital room?: A Little Help needed climbing 3-5 steps with a railing? : A Little 6 Click Score: 20    End of Session Equipment Utilized During Treatment: Gait belt Activity Tolerance: Other (comment) (limited due to pt participation and wanting to eat lunch) Patient left: with call bell/phone within reach;in chair (pt agreeable to sit in chair does not want legs elevated/reclined, RN aware) Nurse Communication: Mobility status PT Visit Diagnosis: Unsteadiness on feet (R26.81);Muscle weakness (generalized) (M62.81)     Time: [id number] PT Time Calculation (min) (ACUTE ONLY): 12 min  Charges:  $Therapeutic Activity: 8-22 mins                     Zania Kalisz P., PTA Acute Rehabilitation Services Pager: [phone] Office: [place] 04/07/2021, 11:57 AM

## 2021-04-07 NOTE — Consult Note (Addendum)
[place] Nurse Consult Note: Patient receiving care in [place] Day 2C10 Reason for Consult: Treatment for calf wound Wound type: Full thickness right calf wound Pressure Injury POA: NA Measurement: 5/8 cm x 3 cm x 0.2 cm Wound bed: Yellow slough Drainage (amount, consistency, odor)  Purulent drainage on dressing Periwound: Intact Dressing procedure/placement/frequency: Patient currently is using Santyl covered with Hydrofera Blue (not in [facility] formulary). Discussed with the patient that we do not have this in our formulary and if he wished to continue to use, he would have to bring from home. Ordered Santyl. Patient will change his dressing daily. Help by cleaning the wound prior to applying the santyl and hydrofera blue.  Monitor the wound area(s) for worsening of condition such as: Signs/symptoms of infection, increase in size, development of or worsening of odor, development of pain, or increased pain at the affected locations.   Notify the medical team if any of these develop.  Thank you for the consult. [place] nurse will not follow at this time.   Please re-consult the [place] team if needed.  Cathlean Marseilles Tamala Julian, MSN, RN, [place], Lysle Pearl, [facility] Wound Treatment Associate Pager [phone]

## 2021-04-07 NOTE — Progress Notes (Signed)
Nephrology Follow-Up note   Assessment/Recommendations: [name] is a/an 55 y.o. male with a past medical history significant for HTN, HLD, CAD, CHF, afib, OSA, renal tx now back on HD, admitted for GI bleed.  [facility] [phone] MWF   3-1/2 hours EDW 97. HD [place] 2/2.5, Dialyzer 180, Heparin no. Access left aVF.  400 blood flow with 500 DFR-temperature 36.5 Epogen 1400 3 times weekly, Venofer 100 weekly.  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  Complicated by hemorrhagic shock requiring pressor support, hemodynamics improved.    2 Shock: hx of.  Likely hemorrhagic/distributive with possible sepsis.  Now resolved with chronic hypotension as below  3 ESRD: Normally MWF at [facility] via aVF.    Hypotension has complicated dialysis.   - Continue HD per MWF schedule  - Per charting have tolerated systolic blood pressures of 80's on dialysis as his outpatient providers also do per charting  4 Chronic hypotension:  Continue midodrine   5. Anemia of ESRD:  here with GI bleed. On aranesp 200 mcg every Monday.  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  hypocalcemia - did receive calcium gluconate on 5/23.   7. 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  Has been on sodium thiosulfate.     Disposition per primary team   _______________________________________________________  CC: ESRD, GI bleed  Interval History/Subjective:  Last HD on 5/23 with 2 kg UF.  Got PRBC's yesterday with HD.  Feels well today and hoping to go home.  States systolics sometimes in [id number] on outpatient HD.  So thankful to his team for helping him through this  Review of systems:  Denies shortness of breath or chest pain  Denies n/v Denies blood per rectum  Medications:  Current Facility-Administered Medications   Medication Dose Route Frequency Provider Last Rate Last Admin  . 0.9 %  sodium chloride infusion (Manually program via Guardrails IV Fluids)   Intravenous Once Elgergawy, Silver Huguenin, MD      . 0.9 %  sodium chloride infusion  250 mL Intravenous Continuous Barton Dubois, MD      . 0.9 %  sodium chloride infusion  250 mL Intravenous PRN Barton Dubois, MD      . 0.9 %  sodium chloride infusion   Intravenous PRN Kipp Brood, MD   Stopped at 04/03/21 [id number]  . acetaminophen (TYLENOL) tablet 650 mg  650 mg Oral Q6H PRN Chesley Mires, MD   650 mg at 04/06/21 2104   Or  . acetaminophen (TYLENOL) suppository 650 mg  650 mg Rectal Q4H PRN Chesley Mires, MD      . amiodarone (PACERONE) tablet 200 mg  200 mg Oral Daily Barton Dubois, MD   200 mg at 04/06/21 0805  . aspirin EC tablet 81 mg  81 mg Oral Daily Dimple Nanas, RPH   81 mg at 04/06/21 0805  . atorvastatin (LIPITOR) tablet 80 mg  80 mg Oral Daily Chesley Mires, MD   80 mg at 04/06/21 0805  . Chlorhexidine Gluconate Cloth 2 % PADS 6 each  6 each Topical Q0600 Corliss Parish, MD   6 each at 04/06/21 [phone]  . clopidogrel (PLAVIX) tablet 75 mg  75 mg Oral Daily Chesley Mires, MD   75 mg at 04/06/21 0806  . Darbepoetin Alfa (ARANESP) injection 200  mcg  200 mcg Intravenous Q Mon-HD Corliss Parish, MD   200 mcg at 04/06/21 1338  . docusate sodium (COLACE) capsule 100 mg  100 mg Oral Daily PRN Minor, Grace Bushy, NP   100 mg at 04/03/21 0955  . febuxostat (ULORIC) tablet 40 mg  40 mg Oral Daily Barton Dubois, MD   40 mg at 04/06/21 [id number]  . finasteride (PROSCAR) tablet 5 mg  5 mg Oral Daily Candee Furbish, MD   5 mg at 04/06/21 [id number]  . fludrocortisone (FLORINEF) tablet 0.1 mg  0.1 mg Oral Daily Candee Furbish, MD   0.1 mg at 04/06/21 [id number]  . midodrine (PROAMATINE) tablet 15 mg  15 mg Oral TID WC Candee Furbish, MD   15 mg at 04/06/21 1759  . multivitamin with minerals tablet 1 tablet  1 tablet Oral Daily Barton Dubois, MD   1 tablet at  04/06/21 0805  . ondansetron (ZOFRAN) tablet 4 mg  4 mg Oral Q6H PRN Barton Dubois, MD       Or  . ondansetron [facility]) injection 4 mg  4 mg Intravenous Q6H PRN Barton Dubois, MD      . pantoprazole (PROTONIX) EC tablet 40 mg  40 mg Oral BID Vena Rua, PA-C   40 mg at 04/06/21 2104  . predniSONE (DELTASONE) tablet 10 mg  10 mg Oral Q breakfast Chesley Mires, MD   10 mg at 04/06/21 0805  . sodium chloride flush (NS) 0.9 % injection 10-40 mL  10-40 mL Intracatheter Q12H Aviva Signs, MD   10 mL at 04/06/21 0810  . sodium chloride flush (NS) 0.9 % injection 10-40 mL  10-40 mL Intracatheter PRN Aviva Signs, MD      . sodium chloride flush (NS) 0.9 % injection 3 mL  3 mL Intravenous Q12H Barton Dubois, MD   3 mL at 04/06/21 2106  . sodium chloride flush (NS) 0.9 % injection 3 mL  3 mL Intravenous PRN Barton Dubois, MD      . sodium thiosulfate 25 g in sodium chloride 0.9 % 200 mL Infusion for Calciphylaxis  25 g Intravenous Q M,W,F Reesa Chew, MD 200 mL/hr at 04/06/21 1034 25 g at 04/06/21 1034  . tacrolimus (PROGRAF) capsule 0.5 mg  0.5 mg Oral Daily Chesley Mires, MD   0.5 mg at 04/06/21 0808      Physical Exam: Vitals:   04/07/21 0300 04/07/21 0336  BP: (!) 68/45 107/64  Pulse: 61   Resp: 14 18  Temp: 97.6 F (36.4 C)   SpO2: 94%    Total I/O In: 123 [P.O.:120; I.V.:3] Out: -   Intake/Output Summary (Last 24 hours) at 04/07/2021 [id number] Last data filed at 04/06/2021 2106 Gross per 24 hour  Intake 692 ml  Output 2000 ml  Net -1308 ml   General adult male in bed in no acute distress  HEENT normocephalic atraumatic extraocular movements intact sclera anicteric Neck supple trachea midline Lungs clear to auscultation bilaterally normal work of breathing at rest  Heart S1S2 no rub Abdomen soft nontender nondistended Extremities no lower extremity edema  Psych normal mood and affect Neuro - alert and oriented x 3 provides hx and follows commands Access LUE AVF bruit    Test Results I personally reviewed new and old clinical labs and radiology tests Lab Results  Component Value Date   NA 141 04/07/2021   K 3.5 04/07/2021   CL 102 04/07/2021   CO2 26 04/07/2021   BUN  26 (H) 04/07/2021   CREATININE 6.67 (H) 04/07/2021   CALCIUM 7.0 (L) 04/07/2021   ALBUMIN 2.3 (L) 04/02/2021   PHOS 5.4 (H) 04/04/2021     Claudia Desanctis, MD 04/07/2021 6:26 AM

## 2021-04-07 NOTE — Discharge Summary (Addendum)
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 TROPONINI in the last 168 hours. BNP: Invalid input(s): POCBNP CBG: Recent Labs  Lab 04/02/21 1523 04/02/21 1937 04/02/21 2328 04/03/21 0317 04/03/21 0817  GLUCAP 82 82  78 77 86   D-Dimer No results for input(s): DDIMER in the last 72 hours. Hgb A1c No results for input(s): HGBA1C in the last 72 hours. Lipid Profile No results for input(s): CHOL, HDL, LDLCALC, TRIG, CHOLHDL, LDLDIRECT in the last 72 hours. Thyroid function studies No results for input(s): TSH, T4TOTAL, T3FREE, THYROIDAB in the last 72 hours.  Invalid input(s): FREET3 Anemia work up No results for input(s): VITAMINB12, FOLATE, FERRITIN, TIBC, IRON, RETICCTPCT in the last 72 hours. Urinalysis    Component  Value Date/Time   COLORURINE YELLOW 11/15/2016 0330   APPEARANCEUR CLEAR 11/15/2016 0330   LABSPEC 1.015 11/15/2016 0330   PHURINE 8.0 11/15/2016 0330   GLUCOSEU NEGATIVE 11/15/2016 0330   HGBUR SMALL (A) 11/15/2016 0330   BILIRUBINUR NEGATIVE 11/15/2016 0330   KETONESUR NEGATIVE 11/15/2016 0330   PROTEINUR 30 (A) 11/15/2016 0330   NITRITE NEGATIVE 11/15/2016 0330   LEUKOCYTESUR NEGATIVE 11/15/2016 0330   Sepsis Labs Invalid input(s): PROCALCITONIN,  WBC,  LACTICIDVEN Microbiology Recent Results (from the past 240 hour(s))  Resp Panel by RT-PCR (Flu A&B, Covid) Nasopharyngeal Swab     Status: None   Collection Time: 03/30/21  7:35 AM   Specimen: Nasopharyngeal Swab; Nasopharyngeal(NP) swabs in vial transport medium  Result Value Ref Range Status   SARS Coronavirus 2 by RT PCR NEGATIVE NEGATIVE Final    Comment: (NOTE) SARS-CoV-2 target nucleic acids are NOT DETECTED.  The SARS-CoV-2 RNA is generally detectable in upper respiratory specimens during the acute phase of infection. The lowest concentration of SARS-CoV-2 viral copies this assay can detect is 138 copies/mL. A negative result does not preclude SARS-Cov-2 infection and should not be used as the sole basis for treatment or other patient management decisions. A negative result may occur with  improper specimen collection/handling, submission of specimen other than nasopharyngeal swab, presence of viral mutation(s) within the areas targeted by this assay, and inadequate number of viral copies(<138 copies/mL). A negative result must be combined with clinical observations, patient history, and epidemiological information. The expected result is Negative.  Fact Sheet for Patients:  EntrepreneurPulse.com.au  Fact Sheet for Healthcare Providers:  IncredibleEmployment.be  This test is no t yet approved or cleared by the [place] FDA and  has been authorized for detection and/or  diagnosis of SARS-CoV-2 by FDA under an Emergency Use Authorization (EUA). This EUA will remain  in effect (meaning this test can be used) for the duration of the COVID-19 declaration under Section 564(b)(1) of the Act, 21 U.S.C.section 360bbb-3(b)(1), unless the authorization is terminated  or revoked sooner.       Influenza A by PCR NEGATIVE NEGATIVE Final   Influenza B by PCR NEGATIVE NEGATIVE Final    Comment: (NOTE) The Xpert Xpress SARS-CoV-2/FLU/RSV plus assay is intended as an aid in the diagnosis of influenza from Nasopharyngeal swab specimens and should not be used as a sole basis for treatment. Nasal washings and aspirates are unacceptable for Xpert Xpress SARS-CoV-2/FLU/RSV testing.  Fact Sheet for Patients: EntrepreneurPulse.com.au  Fact Sheet for Healthcare Providers: IncredibleEmployment.be  This test is not yet approved or cleared by the [place] FDA and has been authorized for detection and/or diagnosis of SARS-CoV-2 by FDA under an Emergency Use Authorization (EUA). This EUA will remain in effect (meaning this test can be used) for the duration of the COVID-19 declaration under Section 564(b)(1) of the Act, 21 U.S.C. section 360bbb-3(b)(1), unless the authorization is terminated or revoked.  Performed at [facility], [address] [place], [place]  [address]   Culture, blood (routine x 2)     Status: None   Collection Time: 03/30/21  8:33 AM   Specimen: BLOOD RIGHT ARM  Result Value Ref Range Status   Specimen Description BLOOD RIGHT ARM  Final   Special Requests   Final    BOTTLES DRAWN AEROBIC AND ANAEROBIC Blood Culture adequate volume   Culture   Final    NO GROWTH 5 DAYS Performed at [facility], [address], [place], [place] [address]    Report Status 04/04/2021 FINAL  Final  Culture, blood (routine x 2)     Status: None   Collection Time: 03/30/21 10:38 AM   Specimen: BLOOD RIGHT HAND  Result Value  Ref Range Status   Specimen Description   Final    BLOOD RIGHT HAND BOTTLES DRAWN AEROBIC AND ANAEROBIC   Special Requests Blood Culture adequate volume  Final   Culture   Final    NO GROWTH 5 DAYS Performed at [facility], [address] [place], [place] [address]    Report Status 04/04/2021 FINAL  Final  MRSA PCR Screening     Status: Abnormal   Collection Time: 03/30/21  7:03 PM   Specimen: Nasopharyngeal  Result Value Ref Range Status   MRSA by PCR POSITIVE (A) NEGATIVE Final    Comment:        The GeneXpert MRSA Assay (FDA approved for NASAL specimens only), is one component of a comprehensive MRSA colonization surveillance program. It is not intended to diagnose MRSA infection nor to guide or monitor treatment for MRSA infections. RESULT CALLED TO, READ BACK BY AND VERIFIED WITH: J HEARN,RN'@2106'$  03/30/21 [facility] Performed at [facility], [address], [place], [place] [address]      Time coordinating discharge: Over 30 minutes  SIGNED:   Phillips Climes, MD  [facility] 04/07/2021, 10:47 AM Pager   If 7PM-7AM, please contact night-coverage [email or web address] Password [email or web address]

## 2021-04-07 NOTE — Discharge Instructions (Signed)
Follow with Primary MD Gardiner Rhyme, MD in 7 days   Get CBC, CMP,  checked  by Primary MD next visit.    Activity: As tolerated with Full fall precautions use walker/cane & assistance as needed   Disposition Home    Diet:  Renal diet with 1200 cc fluid restriction   On your next visit with your primary care physician please Get Medicines reviewed and adjusted.   Please request your Prim.MD to go over all Hospital Tests and Procedure/Radiological results at the follow up, please get all Hospital records sent to your Prim MD by signing hospital release before you go home.   If you experience worsening of your admission symptoms, develop shortness of breath, life threatening emergency, suicidal or homicidal thoughts you must seek medical attention immediately by calling 911 or calling your MD immediately  if symptoms less severe.  You Must read complete instructions/literature along with all the possible adverse reactions/side effects for all the Medicines you take and that have been prescribed to you. Take any new Medicines after you have completely understood and accpet all the possible adverse reactions/side effects.   Do not drive, operating heavy machinery, perform activities at heights, swimming or participation in water activities or provide baby sitting services if your were admitted for syncope or siezures until you have seen by Primary MD or a Neurologist and advised to do so again.  Do not drive when taking Pain medications.    Do not take more than prescribed Pain, Sleep and Anxiety Medications  Special Instructions: If you have smoked or chewed Tobacco  in the last 2 yrs please stop smoking, stop any regular Alcohol  and or any Recreational drug use.  Wear Seat belts while driving.   Please note  You were cared for by a hospitalist during your hospital stay. If you have any questions about your discharge medications or the care you received while you were in the  hospital after you are discharged, you can call the unit and asked to speak with the hospitalist on call if the hospitalist that took care of you is not available. Once you are discharged, your primary care physician will handle any further medical issues. Please note that NO REFILLS for any discharge medications will be authorized once you are discharged, as it is imperative that you return to your primary care physician (or establish a relationship with a primary care physician if you do not have one) for your aftercare needs so that they can reassess your need for medications and monitor your lab values.

## 2021-04-07 NOTE — Care Management Important Message (Signed)
Important Message  Patient Details  Name: [name] MRN: [id number] Date of Birth: August 31, 1966   Medicare Important Message Given:  Yes     Abayomi Pattison [address] 04/07/2021, 3:58 PM

## 2021-04-08 NOTE — Progress Notes (Signed)
Discharge summary and Renal note faxed to patient's outpatient HD clinic to provide continuity of care. Navigator called clinic yesterday to inform that patient was discharging and would should be back for outpatient HD on 04/08/21.  Alphonzo Cruise, [facility] [place] [facility]al Navigator [phone]

## 2021-04-20 ENCOUNTER — Telehealth: Payer: Self-pay

## 2021-04-20 NOTE — Telephone Encounter (Signed)
Tanya from [facility] called- pt was seen by them today and they are calling to see if we can move up his appointment with [place]. He currently has an appointment on 05/20/21 with Dr.Carver. per [facility] discharge instructions, pt needed 2 week follow up. We do not have any appointments available at this time. I advised her that if someone cancels we would get him in sooner.   Dr.Carver, please review pts discharge summary from [facility]. Do we need to try to make a spot for him? Please advise.

## 2021-04-21 NOTE — Telephone Encounter (Signed)
I spoke with the pt, advised him we would try to get him a sooner appointment if someone cancels. He has dialysis on Mon, Wed and Fri until 10:30 and could be here by 11, on Tues and Thurs he has to be at work at 78 so he is requesting an early appointment on those days.  We had a cancellation tomorrow with Vicente Males at 8:30 but the pt will be at dialysis and cannot be here. Pt was appreciative of the call and is understanding with the situation.   Manuela Schwartz, please let me know if you have any more cancellations that would work for this pt.  [facility] clinical pool, please let me know if you speak with anyone and they cancel their appointment.

## 2021-04-21 NOTE — Telephone Encounter (Signed)
Agree that patient needs to follow up with [place] sooner than 7/6. Can be with me or one of the APPs thanks

## 2021-04-21 NOTE — Telephone Encounter (Signed)
noted 

## 2021-04-22 NOTE — Telephone Encounter (Signed)
Dr.Carver had a cancellation for 1:30 today. I called the pt and he said he was not able to come today.

## 2021-04-27 NOTE — Telephone Encounter (Signed)
We had a cancellation with [name] on Wednesday at 1:30. I called pt and he accepted this appointment. I have cancelled his July appointment and pt is aware.

## 2021-04-29 ENCOUNTER — Other Ambulatory Visit: Payer: Self-pay

## 2021-04-29 ENCOUNTER — Encounter: Payer: Self-pay | Admitting: Gastroenterology

## 2021-04-29 ENCOUNTER — Ambulatory Visit (INDEPENDENT_AMBULATORY_CARE_PROVIDER_SITE_OTHER): Payer: Medicare Other | Admitting: Gastroenterology

## 2021-04-29 VITALS — BP 106/72 | HR 102 | Temp 97.5°F | Ht 73.0 in | Wt 213.4 lb

## 2021-04-29 DIAGNOSIS — K31811 Angiodysplasia of stomach and duodenum with bleeding: Secondary | ICD-10-CM | POA: Diagnosis not present

## 2021-04-29 NOTE — Patient Instructions (Signed)
If possible, please have dialysis fax your labs weekly to [place]. Fax# [phone].  You are still at risk of bleeding as those blood vessels are there, so if you have recurrent black stool, go to the emergency room. If your blood count drifts too low, we may need to do another endoscopy with a look farther down your small intestine.  We will see you in 2 months!  I enjoyed seeing you again today! As you know, I value our relationship and want to provide genuine, compassionate, and quality care. I welcome your feedback. If you receive a survey regarding your visit,  I greatly appreciate you taking time to fill this out. See you next time!  Annitta Needs, PhD, ANP-BC [facility] Gastroenterology

## 2021-04-29 NOTE — Progress Notes (Signed)
Referring Provider: Gardiner Rhyme, MD Primary Care Physician:  Gardiner Rhyme, MD Primary GI: Dr. Abbey Chatters   Chief Complaint  Patient presents with   Anemia    No bleeding. Stool is green. Hgb drawn today at dialysis. Hgb Monday was 8.7    HPI:   [name] is a 55 y.o. male presenting today with a history of UGI bleed in May 2022 with cardiogenic shock, presenting to the ED after syncopal episode at dialysis. Hgb 4.6 on admission with melena, on chronic Eliquis for afib and had also just been started on Effient after stent placement May 2022. 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 Received total of 9 units PRBCs (3 at [facility] and 6 at [facility]). Discharge Hgb was 8.6, which is near his baseline.    States Hgb today was 8.7 on Monday and checked again.  Was 9.3 last Monday. No melena or hematochezia. Stool solid. No diarrhea. Felt bloated today. Only on Plavix. No longer on aspirin or Eliquis. Protonix BID. Goes to Cardiology on Tuesday.    Past Medical History:  Diagnosis Date   Atrial fibrillation (HCC)    CHF (congestive heart failure) (HCC)    Chronic kidney disease    Coronary artery disease    Dysrhythmia    A-fib   H/O heart artery stent    HLD (hyperlipidemia)    Hypertension    Kidney transplanted    Pneumonia    Sleep apnea    CPAP    Past Surgical History:  Procedure Laterality Date   ABLATION     CHOLECYSTECTOMY     COLONOSCOPY WITH PROPOFOL N/A 12/09/2020   non-bleeding internal hemorrhoids, sigmoid and descending colon diverticulosis, stool in entire examined colon.    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     amiodarone (PACERONE) 200 MG tablet Take 200 mg by mouth daily.     Ascorbic Acid (VITAMIN C PO) Take 1 tablet by mouth daily.     atorvastatin (LIPITOR) 80 MG tablet Take 80 mg by mouth daily.     clopidogrel (PLAVIX) 75 MG tablet Take 1 tablet (75 mg total) by mouth daily. 30 tablet 1   febuxostat (ULORIC) 40 MG tablet Take 40 mg by mouth daily.     ferric citrate (AURYXIA) 1 GM 210 MG(Fe) tablet Take 210 mg by mouth 3 (three) times daily with meals.     midodrine (PROAMATINE) 5 MG tablet Take 2 tablets (10 mg total) by mouth 3 (three) times daily with meals. 180 tablet 0   Multiple Vitamins-Minerals (MULTIVITAMIN WITH MINERALS) tablet Take 1 tablet by mouth daily.     pantoprazole (PROTONIX) 40 MG tablet Take 1 tablet (40 mg total) by mouth 2 (two) times daily. 60 tablet 1   predniSONE (DELTASONE) 10 MG tablet Take 10 mg by mouth daily with breakfast.  tacrolimus (PROGRAF) 0.5 MG capsule Take 0.5 mg by mouth daily.     tamsulosin (FLOMAX) 0.4 MG CAPS capsule Take 0.4 mg by mouth daily.      Wound Dressings (HYDROFERA BLUE FOAM DRESSING EX) Apply 1 patch topically every other day.     No current facility-administered medications for this visit.    Allergies as of 04/29/2021 - Review Complete 04/29/2021  Allergen Reaction Noted   Codeine  10/16/2020   Vancomycin Swelling 11/15/2016    Family History  Problem Relation Age of Onset   Hypertension Mother    Heart attack Father    Hypertension Maternal Grandmother    Stroke Maternal Grandfather    Heart attack Paternal Uncle    Heart attack Paternal Uncle    Heart attack Paternal Aunt    Stroke Maternal Uncle     Social History   Socioeconomic  History   Marital status: Married    Spouse name: Not on file   Number of children: 4   Years of education: Not on file   Highest education level: Not on file  Occupational History   Occupation: partial disabled   Occupation: host  Tobacco Use   Smoking status: Never   Smokeless tobacco: Never  Substance and Sexual Activity   Alcohol use: No   Drug use: No   Sexual activity: Not on file  Other Topics Concern   Not on file  Social History Narrative   Not on file   Social Determinants of Health   Financial Resource Strain: Not on file  Food Insecurity: Not on file  Transportation Needs: Not on file  Physical Activity: Not on file  Stress: Not on file  Social Connections: Not on file    Review of Systems: Gen: Denies fever, chills, anorexia. Denies fatigue, weakness, weight loss.  CV: Denies chest pain, palpitations, syncope, peripheral edema, and claudication. Resp: Denies dyspnea at rest, cough, wheezing, coughing up blood, and pleurisy. GI: see HPI Derm: Denies rash, itching, dry skin Psych: Denies depression, anxiety, memory loss, confusion. No homicidal or suicidal ideation.  Heme: Denies bruising, bleeding, and enlarged lymph nodes.  Physical Exam: BP 106/72   Pulse (!) 102   Temp (!) 97.5 F (36.4 C) (Temporal)   Ht '6\' 1"'$  (1.854 m)   Wt 213 lb 6.4 oz (96.8 kg)   BMI 28.15 kg/m  General:   Alert and oriented. No distress noted. Pleasant and cooperative.  Head:  Normocephalic and atraumatic. Eyes:  Conjuctiva clear without scleral icterus. Mouth:  mask in place Abdomen:  +BS, soft, non-tender and non-distended. No rebound or guarding. No HSM or masses noted. Msk:  Symmetrical without gross deformities. Normal posture. Extremities:  Without edema. Neurologic:  Alert and  oriented x4 Psych:  Alert and cooperative. Normal mood and affect.  ASSESSMENT: [name] is a 55 y.o. male presenting today with a history of UGI bleed in May 2022 with cardiogenic  shock, presenting to the ED after syncopal episode at dialysis. Hgb 4.6 on admission with melena, on chronic Eliquis for afib and had also just been started on Effient after stent placement May 2022. 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 Received total of 9 units PRBCs (3 at [facility] and 6 at [facility]). Discharge Hgb was 8.6, which is near his baseline.   Denies any further overt GI bleeding. He is now only on Plavix and off of aspirin  and Eliquis. He is followed with serial H/H through dialysis. If he begins to drift more or has overt melena, will need to attempt repeat EGD with therapeutic intent, as hemospray is only going to be a temporary solution. Hopefully, risks decreased off of Plavix and aspirin but still remains at higher risk on Eliquis.    PLAN:  Continue Protonix BID Requesting labs from Nephrology EGD with enteroscopy if recurrent bleeding Return in 2 months  Annitta Needs, PhD, [facility] Gastroenterology

## 2021-05-06 ENCOUNTER — Encounter: Payer: Self-pay | Admitting: Gastroenterology

## 2021-05-20 ENCOUNTER — Ambulatory Visit: Payer: Medicare Other | Admitting: Internal Medicine

## 2021-05-25 ENCOUNTER — Telehealth: Payer: Self-pay | Admitting: Gastroenterology

## 2021-05-25 NOTE — Telephone Encounter (Signed)
Received outside labs from dialysis:  June 27: Hgb 8.7 July 6: Hgb 7.8.  To be scanned. I have reached out to patient to inquire on any evidence of overt GI bleeding. Plans for EGD with enteroscopy if recurrent bleeding.   Annitta Needs, PhD, ANP-BC [facility] Gastroenterology

## 2021-05-27 ENCOUNTER — Telehealth: Payer: Self-pay

## 2021-05-27 NOTE — Telephone Encounter (Signed)
error 

## 2021-05-27 NOTE — Telephone Encounter (Signed)
Documentation from [facility] on your desk regarding orders

## 2021-05-27 NOTE — Telephone Encounter (Addendum)
As of 7/13, Hgb now 7.0. Patient notes dark black stool. No abdominal pain, N/V. Hgb down from 7.8 a week ago. Dialysis has arranged blood transfusion for 7/14.   Discussing with Dr. Abbey Chatters diagnostic evaluation (EGD with enteroscopy). As patient is on Plavix, we will need to decide how long to hold this. He has a history of recent stent placement as well in May 2022. He is aware of signs/symptoms that would prompt need for urgent ED evaluation. History complicated due to prior medical issues.  Awaiting final word from Dr. Abbey Chatters. Patient is well versed on signs/symptoms that would require urgent evaluation. I attempted to call again today (last spoke with patient on 7/13) to update on evolving plan of care.

## 2021-05-28 ENCOUNTER — Encounter: Payer: Self-pay | Admitting: Gastroenterology

## 2021-05-28 NOTE — Telephone Encounter (Signed)
I spoke with patient. Received 1 unit PRBCs today. Dialysis Monday. H/H repeated on Monday.

## 2021-05-28 NOTE — Telephone Encounter (Signed)
Patient called back. He has dialysis M,W,F. He scheduled procedure for 7/21 at 7:30am, arrival 7am. Discussed instructions in detail with pt. Also sent via mychart. He states he gets IV iron with dialyiss. Spoke with Vicente Males and states this is okay to continue. Pt aware. Orders entered  [employer] and no PA is required

## 2021-05-28 NOTE — Telephone Encounter (Signed)
LMOVM for pt to call back Per AB needs to be done next week. Spoke with endo and can do 7/20, 7/21 or 7/22

## 2021-05-28 NOTE — Telephone Encounter (Signed)
[facility] Clinical pool:  Discussed with Dr. Abbey Chatters. Please arrange capsule study ASAP due to acute on chronic anemia and melena. This will help guide need for diagnostic procedures. May need referral to [facility] for deep enteroscopy vs EGD with enteroscopy here.

## 2021-06-04 ENCOUNTER — Ambulatory Visit (HOSPITAL_COMMUNITY)
Admission: RE | Admit: 2021-06-04 | Discharge: 2021-06-04 | Disposition: A | Discharge status: Home / Self Care | Payer: Medicare Other | Attending: Internal Medicine | Admitting: Internal Medicine

## 2021-06-04 ENCOUNTER — Encounter (HOSPITAL_COMMUNITY)
Admission: RE | Disposition: A | Discharge status: Home / Self Care | Payer: Self-pay | Source: Home / Self Care | Attending: Internal Medicine

## 2021-06-04 DIAGNOSIS — Z992 Dependence on renal dialysis: Secondary | ICD-10-CM | POA: Insufficient documentation

## 2021-06-04 DIAGNOSIS — I998 Other disorder of circulatory system: Secondary | ICD-10-CM | POA: Diagnosis not present

## 2021-06-04 DIAGNOSIS — K921 Melena: Secondary | ICD-10-CM

## 2021-06-04 DIAGNOSIS — D649 Anemia, unspecified: Secondary | ICD-10-CM | POA: Diagnosis not present

## 2021-06-04 DIAGNOSIS — K5790 Diverticulosis of intestine, part unspecified, without perforation or abscess without bleeding: Secondary | ICD-10-CM | POA: Diagnosis not present

## 2021-06-04 DIAGNOSIS — K259 Gastric ulcer, unspecified as acute or chronic, without hemorrhage or perforation: Secondary | ICD-10-CM | POA: Insufficient documentation

## 2021-06-04 DIAGNOSIS — Q2733 Arteriovenous malformation of digestive system vessel: Secondary | ICD-10-CM | POA: Insufficient documentation

## 2021-06-04 DIAGNOSIS — K3189 Other diseases of stomach and duodenum: Secondary | ICD-10-CM | POA: Diagnosis not present

## 2021-06-04 DIAGNOSIS — I4891 Unspecified atrial fibrillation: Secondary | ICD-10-CM | POA: Insufficient documentation

## 2021-06-04 DIAGNOSIS — Z7901 Long term (current) use of anticoagulants: Secondary | ICD-10-CM | POA: Insufficient documentation

## 2021-06-04 DIAGNOSIS — R55 Syncope and collapse: Secondary | ICD-10-CM | POA: Diagnosis present

## 2021-06-04 HISTORY — PX: GIVENS CAPSULE STUDY: SHX5432

## 2021-06-04 SURGERY — IMAGING PROCEDURE, GI TRACT, INTRALUMINAL, VIA CAPSULE

## 2021-06-08 ENCOUNTER — Telehealth: Payer: Self-pay | Admitting: Gastroenterology

## 2021-06-08 DIAGNOSIS — D649 Anemia, unspecified: Secondary | ICD-10-CM

## 2021-06-08 DIAGNOSIS — K921 Melena: Secondary | ICD-10-CM

## 2021-06-08 NOTE — Telephone Encounter (Signed)
Small bowel capsule study results, see full report:   Few gastric erosions, several small bowel AVMs as noted above.  No active bleeding noted during the study.  Small bowel AVMs could contribute to chronic occult GI blood loss in the setting of Effient/Eliquis.  Findings will be forwarded to Roseanne Kaufman, NP for further recommendations.

## 2021-06-08 NOTE — Op Note (Signed)
  Small Bowel Givens Capsule Study Procedure date: June 04, 2021  Referring Provider: Roseanne Kaufman, NP PCP:  Dr. Gardiner Rhyme, MD  Indication for procedure: 55 year old male with history of upper GI bleed (melena) in May 2022 with cardiogenic shock, presented to the ED after syncopal episode at dialysis with hemoglobin of 4.6.  Chronically anticoagulated with Eliquis for A. fib and had recently been started on Effient after stent placement May 2022.  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 Patient has had ongoing need for blood transfusions, noted dark stools and suspected ongoing small bowel bleeding.  Colonoscopy completed in January 2022 noted to have moderate diverticulosis, nonbleeding internal hemorrhoids.  Patient data:  Wt: 96.8 kg Ht: 6 foot 1 inch  Findings: Small bowel capsule complete.  No active bleeding noted during exam.  Few gastric erosions noted at 4 minutes 26 seconds, 14 minutes 21 seconds, 1 hour 35 minutes 17 seconds, 1 hour 36 minutes.  Few small bowel AVMs noted at 1 hour 59 minutes 9 seconds, 2 hours 23 minutes 9 seconds, 4 hours 33 minutes 45 seconds.  First Gastric image: 23 seconds First Duodenal image: 1 hour 36 minutes 36 seconds First Ileo-Cecal Valve image: 5 hours 13 minutes 17 seconds First Cecal image: 5 hours 14 minutes 17 seconds Gastric Passage time: 1 hour 36 minutes  Small Bowel Passage time: 3 hours 37 minutes   Summary & Recommendations: Few gastric erosions, several small bowel AVMs as noted above.  No active bleeding noted during the study.  Small bowel AVMs could contribute to chronic occult GI blood loss in the setting of Effient/Eliquis.  Findings will be forwarded to Roseanne Kaufman, NP for further recommendations.  Laureen Ochs. Bernarda Caffey [facility] [phone] 7/25/20222:21 PM

## 2021-06-10 ENCOUNTER — Encounter (HOSPITAL_COMMUNITY): Payer: Self-pay | Admitting: Internal Medicine

## 2021-06-10 NOTE — Telephone Encounter (Signed)
Discussed with Dr. Abbey Chatters. Will monitor for now. If recurrent drops in Hgb, can trial octreotide monthly. Consider referral for enteroscopy at [facility] as additional option.

## 2021-06-19 ENCOUNTER — Telehealth: Payer: Self-pay | Admitting: *Deleted

## 2021-06-19 NOTE — Telephone Encounter (Signed)
Pt called and stated that his Hgb was 8.4 and is now at 7. States he is at dialysis now and wants to know if he should go to the ER to get blood.

## 2021-06-19 NOTE — Telephone Encounter (Signed)
If he is feeling weak, lightheaded, short of breath or passing black or bloody stools, those would be reasons to go to ER immediately.   If he is feeling ok, we could recheck his Hgb on Monday. Please have Vicente Males follow up on Hgb if he waits till Monday, as I will be on vacation and she has been seeing him.

## 2021-06-19 NOTE — Telephone Encounter (Signed)
Noted. Will save for Vicente Males for further input next week.

## 2021-06-19 NOTE — Telephone Encounter (Signed)
Noted  

## 2021-06-19 NOTE — Telephone Encounter (Signed)
Spoke to pt. Asked how he was feeling and if he had any lightheadedness or weakness. He verbalized he was feeling fine. He stated he didn't even know his Hgb was low. Stated he a couple dark stools last week. But he thought it was from the capsule study he had week before. Informed him to go to ER if he started to feel weak, lightheaded, or shortness of breath. Also, if stools are black or bloody. Pt voiced  understanding. Pt stated he is waiting for the hospital to call him to let him know if he can come in, to receive 2 units of  blood.

## 2021-06-23 NOTE — Telephone Encounter (Signed)
I have sent a message to patient regarding monthly long-acting octreotide injections via IM.

## 2021-06-25 MED ORDER — OCTREOTIDE ACETATE 20 MG IM KIT: 20.0000 mg | PACK | INTRAMUSCULAR | 5 refills | Status: DC

## 2021-06-25 NOTE — Telephone Encounter (Signed)
I have sent in octreotide LAR 20 mg IM to be taken once a month. Patient's wife is a Marine scientist and can administer IM injections. I discussed this with patient. We may have to send this to a specialty pharmacy, but I have gotten the ball rolling and will see what happens. Patient is aware this was sent to his pharmacy.

## 2021-06-25 NOTE — Addendum Note (Signed)
Addended by: Annitta Needs on: 06/25/2021 02:09 PM   Modules accepted: Orders

## 2021-07-02 ENCOUNTER — Telehealth: Payer: Self-pay | Admitting: *Deleted

## 2021-07-02 NOTE — Telephone Encounter (Addendum)
Received call from [facility] with [facility].  She wanted to inform us that EPA (electronic prior authorization) is needed.  She said the web site is [employer]fr.  She wants [place] to call [phone] once approved to inform them.

## 2021-07-02 NOTE — Telephone Encounter (Signed)
FYI: Spoke with Butch Penny at [employer] and was advised by her that pt is waiting on [place] to get a PA on his Sandostatin. I advised her that I haven't seen anything from [employer] Meds or [facility]. I advised her that I got a MyChart message regarding a conversation the pt and Roseanne Kaufman, NP was having regarding pt receiving his Octretide LAR 20 mg IM. She advised me they did not have that generic that all they have is Sandostatin and I would have to get that approved through [facility]. I advised that Roseanne Kaufman wrote it for the generic and I couldn't change it myself, but I would check further with Roseanne Kaufman NP (whose on vacation and returns next Tuesday the 23rd.)

## 2021-07-10 ENCOUNTER — Telehealth: Payer: Self-pay | Admitting: Internal Medicine

## 2021-07-10 NOTE — Telephone Encounter (Signed)
[phone] patient called asking about his prescription, he called his pharmacy and was told it is still being kicked out and not able to fill

## 2021-07-12 NOTE — Telephone Encounter (Signed)
Dena, what exactly do I need to do do get this going? Not sure what I need to do on my end.

## 2021-07-13 NOTE — Telephone Encounter (Signed)
Returned the pt's call and advised him that [name] had been on vacation and that the Specialty pharmacy didn't have the Rx [name] sent, she was advised today another medication is needed for the pt. Once this is complete we will call him.

## 2021-07-13 NOTE — Telephone Encounter (Signed)
[facility] number is [phone] and I just seen another message from this pt still trying to get the Rx to go through at the pharmacy. They should be able to tell him why it is not going through.

## 2021-07-13 NOTE — Telephone Encounter (Signed)
[name], They do not have what you prescribed for the pt and something else needs to be sent for the pt. That's how I understood it to be.

## 2021-07-13 NOTE — Telephone Encounter (Signed)
There was something mentioned about getting it approved through [employer]? Do you have a number I can call?

## 2021-07-14 MED ORDER — OCTREOTIDE ACETATE 20 MG IM KIT: 20.0000 mg | PACK | INTRAMUSCULAR | 5 refills | Status: DC

## 2021-07-14 NOTE — Addendum Note (Signed)
Addended by: Annitta Needs on: 07/14/2021 04:15 PM   Modules accepted: Orders

## 2021-07-14 NOTE — Telephone Encounter (Signed)
I spoke with [facility]. They do not have a prescription, because I never sent it to them. [facility] Difficult to follow from all of these notes exactly what needed to happen.   I am sending again to [employer] this time. Please let patient know.   There is high likelihood he will need this from a specialty pharmacy, but [facility] said they would be able to hopefully fill this. May need a PA.

## 2021-07-14 NOTE — Telephone Encounter (Signed)
Noted and ok

## 2021-07-16 ENCOUNTER — Telehealth: Payer: Self-pay

## 2021-07-16 NOTE — Telephone Encounter (Signed)
Sent the pt [facility] number to call and see if everything went through and he is to call me back.

## 2021-07-16 NOTE — Telephone Encounter (Signed)
Pt's Sandostatin was denied due to the wrong Dx code. Spoke with Roselle at the appeals dept and was advised to fill out pages 8 and 9 of denial and put with 1st page and pt's office notes and send it to the appeals dept @ [phone]. Dx codes used were K92.1 and K92.2. everything faxed and waiting on a decision.

## 2021-07-21 ENCOUNTER — Encounter: Payer: Self-pay | Admitting: Gastroenterology

## 2021-07-27 ENCOUNTER — Telehealth: Payer: Self-pay

## 2021-07-27 NOTE — Telephone Encounter (Signed)
Dena,  I had spoken to [facility] and sent in a prescription. Is something still needed on my part? I am going out of town and won't be able to do this till the following Tuesday possibly.

## 2021-07-27 NOTE — Telephone Encounter (Signed)
The pt left me a message on the phone regarding his medication. We still have not gotten together on this. I have every piece of documentation that has come over. At this point a letter, or a call needs to be placed. [place] would like to assist you in writing an appeal letter. Page 2 of the pack of papers I have for the pt.

## 2021-07-27 NOTE — Telephone Encounter (Signed)
I didn't see any notes regarding this in the pt's chart. I just got more correspondence regarding the appeal letter that was done on Sept. 1st and it is still denied. I will be off tomorrow but I can leave on your desk in a red folder so you will know its from me.

## 2021-08-27 ENCOUNTER — Telehealth: Payer: Self-pay | Admitting: Gastroenterology

## 2021-08-27 ENCOUNTER — Encounter: Payer: Self-pay | Admitting: Gastroenterology

## 2021-08-27 ENCOUNTER — Telehealth: Payer: Self-pay | Admitting: *Deleted

## 2021-08-27 ENCOUNTER — Other Ambulatory Visit: Payer: Self-pay

## 2021-08-27 ENCOUNTER — Encounter: Payer: Self-pay | Admitting: Internal Medicine

## 2021-08-27 ENCOUNTER — Telehealth (INDEPENDENT_AMBULATORY_CARE_PROVIDER_SITE_OTHER): Payer: Medicare Other | Admitting: Gastroenterology

## 2021-08-27 DIAGNOSIS — K922 Gastrointestinal hemorrhage, unspecified: Secondary | ICD-10-CM | POA: Insufficient documentation

## 2021-08-27 NOTE — Telephone Encounter (Signed)
[name], patient needs 3 month follow-up. Thanks! He would like to be virtual at that time. I'm ok with that as long as no issues.

## 2021-08-27 NOTE — Patient Instructions (Addendum)
Thank you for keeping me updated on the labs each week!  If you have shortness of breath, persistent black stools, please go to the emergency room.  I am working on Octreotide approval.   We may need to do another procedure or refer you to [facility] to evaluate further.   We will see you in 3 months virtually!   I enjoyed seeing you again today! As you know, I value our relationship and want to provide genuine, compassionate, and quality care. I welcome your feedback. If you receive a survey regarding your visit,  I greatly appreciate you taking time to fill this out. See you next time!  Annitta Needs, PhD, ANP-BC [facility]

## 2021-08-27 NOTE — Telephone Encounter (Signed)
[name], you are scheduled for a virtual visit with your provider today.  Just as we do with appointments in the office, we must obtain your consent to participate.  Your consent will be active for this visit and any virtual visit you may have with one of our providers in the next 365 days.  If you have a MyChart account, I can also send a copy of this consent to you electronically.  All virtual visits are billed to your insurance company just like a traditional visit in the office.  As this is a virtual visit, video technology does not allow for your provider to perform a traditional examination.  This may limit your provider's ability to fully assess your condition.  If your provider identifies any concerns that need to be evaluated in person or the need to arrange testing such as labs, EKG, etc, we will make arrangements to do so.  Although advances in technology are sophisticated, we cannot ensure that it will always work on either your end or our end.  If the connection with a video visit is poor, we may have to switch to a telephone visit.  With either a video or telephone visit, we are not always able to ensure that we have a secure connection.   I need to obtain your verbal consent now.   Are you willing to proceed with your visit today?

## 2021-08-27 NOTE — Telephone Encounter (Signed)
Pt consented to a virtual visit. 

## 2021-08-27 NOTE — Progress Notes (Signed)
Primary Care Physician:  Gardiner Rhyme, MD  Primary GI: Dr. Abbey Chatters  Patient Location: Home   Provider Location: [facility] office   Reason for Visit: Follow-up   Persons present on the virtual encounter, with roles: patient and NP   Total time (minutes) spent on medical discussion: 10 minutes   Due to COVID-19, visit was conducted using virtual method.  Visit was requested by patient.  Virtual Visit via MyChart Video Note Due to COVID-19, visit is conducted virtually and was requested by patient.   I connected with [name] on 08/27/21 at  8:00 AM EDT by video and verified that I am speaking with the correct person using two identifiers.   I discussed the limitations, risks, security and privacy concerns of performing an evaluation and management service by telephone and the availability of in person appointments. I also discussed with the patient that there may be a patient responsible charge related to this service. The patient expressed understanding and agreed to proceed.  Chief Complaint  Patient presents with   AVM    WANTS TO DISCUSS HEMOGLOBIN     History of Present Illness: Very pleasant 55 year old male presenting today with a history of UGI bleed in May 2022 with cardiogenic shock, presenting to the ED after syncopal episode at dialysis. Hgb 4.6 on admission with melena, on chronic Eliquis for afib and had also just been started on Effient after stent placement May 2022. 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 Received total of 9 units PRBCs (3 at [facility] and 6 at [facility]). Discharge Hgb was 8.6, which is near his baseline. 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 We had prescribed monthly ocreotide, but insurance stated not FDA indicated. We are working on an expedited appeal. Will need referral to [facility] for  enteroscopy if recurrent bleeding.   He has been having weekly checks of Hgb, which have stayed stable around 8. Recent Hgb 7.7, which has drifted down over past few weeks. Stool alternates between green and sometimes black. Last black stool Tuesday. Today green stool. Dialysis follows closely and has increased Epogen. Dialysis will give blood as well if appropriate; he has not needed a transfusion in several months. Remains on aspirin and Plavix. Watchman procedure upcoming. PPI BID. Overall, he feels well.   Past Medical History:  Diagnosis Date   Atrial fibrillation (HCC)    CHF (congestive heart failure) (HCC)    Chronic kidney disease    Coronary artery disease    Dysrhythmia    A-fib   H/O heart artery stent    HLD (hyperlipidemia)    Hypertension    Kidney transplanted    Pneumonia    Sleep apnea    CPAP     Past Surgical History:  Procedure Laterality Date   ABLATION     CHOLECYSTECTOMY     COLONOSCOPY WITH PROPOFOL N/A 12/09/2020   non-bleeding internal hemorrhoids, sigmoid and descending colon diverticulosis, stool in entire examined colon.    ESOPHAGOGASTRODUODENOSCOPY (EGD) WITH PROPOFOL N/A 03/31/2021   active bleeding in duodenum due to suspected AVM s/p hemostatic spray   GIVENS CAPSULE STUDY N/A 06/04/2021   Procedure: GIVENS CAPSULE STUDY;  Surgeon: Eloise Harman, DO;  Location: [facility];  Service: Endoscopy;  Laterality: N/A;  7:30am   HEMOSTASIS CONTROL  03/31/2021   Procedure: HEMOSTASIS CONTROL;  Surgeon:  Thornton Park, MD;  Location: [place];  Service: Gastroenterology;;   HERNIA MESH REMOVAL     abdominal   TRANSPLANTATION RENAL  2005     Current Meds  Medication Sig   acetaminophen (TYLENOL) 325 MG tablet Take 2 tablets (650 mg total) by mouth every 6 (six) hours as needed for mild pain, fever or headache.   amiodarone (PACERONE) 200 MG tablet Take 200 mg by mouth daily.   Ascorbic Acid (VITAMIN C PO) Take 1 tablet by mouth daily.    atorvastatin (LIPITOR) 80 MG tablet Take 80 mg by mouth daily.   clopidogrel (PLAVIX) 75 MG tablet Take 1 tablet (75 mg total) by mouth daily.   ferric citrate (AURYXIA) 1 GM 210 MG(Fe) tablet Take 210 mg by mouth 3 (three) times daily with meals.   midodrine (PROAMATINE) 5 MG tablet Take 2 tablets (10 mg total) by mouth 3 (three) times daily with meals.   Multiple Vitamins-Minerals (MULTIVITAMIN WITH MINERALS) tablet Take 1 tablet by mouth daily.   pantoprazole (PROTONIX) 40 MG tablet Take 1 tablet (40 mg total) by mouth 2 (two) times daily.   predniSONE (DELTASONE) 10 MG tablet Take 10 mg by mouth daily with breakfast.   tacrolimus (PROGRAF) 0.5 MG capsule Take 0.5 mg by mouth daily.   tamsulosin (FLOMAX) 0.4 MG CAPS capsule Take 0.4 mg by mouth daily.    Wound Dressings (HYDROFERA BLUE FOAM DRESSING EX) Apply 1 patch topically every other day.     Family History  Problem Relation Age of Onset   Hypertension Mother    Heart attack Father    Hypertension Maternal Grandmother    Stroke Maternal Grandfather    Heart attack Paternal Uncle    Heart attack Paternal Uncle    Heart attack Paternal Aunt    Stroke Maternal Uncle     Social History   Socioeconomic History   Marital status: Married    Spouse name: Not on file   Number of children: 4   Years of education: Not on file   Highest education level: Not on file  Occupational History   Occupation: partial disabled   Occupation: host  Tobacco Use   Smoking status: Never   Smokeless tobacco: Never  Substance and Sexual Activity   Alcohol use: No   Drug use: No   Sexual activity: Not on file  Other Topics Concern   Not on file  Social History Narrative   Not on file   Social Determinants of Health   Financial Resource Strain: Not on file  Food Insecurity: Not on file  Transportation Needs: Not on file  Physical Activity: Not on file  Stress: Not on file  Social Connections: Not on file       Review of  Systems: Gen: Denies fever, chills, anorexia. Denies fatigue, weakness, weight loss.  CV: Denies chest pain, palpitations, syncope, peripheral edema, and claudication. Resp: Denies dyspnea at rest, cough, wheezing, coughing up blood, and pleurisy. GI: see HPI Derm: Denies rash, itching, dry skin Psych: Denies depression, anxiety, memory loss, confusion. No homicidal or suicidal ideation.  Heme: Denies bruising, bleeding, and enlarged lymph nodes.  Observations/Objective: No distress. Unable to perform physical exam due to video encounter.  Assessment and Plan: 55 year old male presenting today with a history of UGI bleed in May 2022 with cardiogenic shock, presenting to the ED after syncopal episode at dialysis. Hgb 4.6 on admission with melena, on chronic Eliquis for afib and had also just been started on Effient  after stent placement May 2022. 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 Received total of 9 units PRBCs (3 at [facility] and 6 at [facility]). Discharge Hgb was 8.6, which is near his baseline. 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    Unfortunately, it has been difficult to obtain approval for octreotide monthly. I am working on an expedited appeal. His Hgb has been fairly stable for several months but now drifting again. Earlier this week 7.7, and Epogen has been increased. Weekly checks performed by dialysis, with blood transfusion as indicated.   Will need referral to [facility] for enteroscopy if has further bleeding. He is well aware of signs and symptoms that would necessitate urgent evaluation. We will work on expedited appeal for patient to receive octreotide.   Will see patient in 3 months. He was thanked for keeping [place] updated with weekly H/H checks via MyChart. He desires virtual visit in 3 months, which we will arrange.   Follow  Up Instructions:    I discussed the assessment and treatment plan with the patient. The patient was provided an opportunity to ask questions and all were answered. The patient agreed with the plan and demonstrated an understanding of the instructions.   The patient was advised to call back or seek an in-person evaluation if the symptoms worsen or if the condition fails to improve as anticipated.  I provided 10 minutes of face-to-face time during this MyChart Video encounter.  Annitta Needs, PhD, ANP-BC [facility]

## 2021-09-03 ENCOUNTER — Encounter: Payer: Self-pay | Admitting: Gastroenterology

## 2021-09-03 ENCOUNTER — Telehealth: Payer: Self-pay | Admitting: Gastroenterology

## 2021-09-03 DIAGNOSIS — K31811 Angiodysplasia of stomach and duodenum with bleeding: Secondary | ICD-10-CM

## 2021-09-03 DIAGNOSIS — K922 Gastrointestinal hemorrhage, unspecified: Secondary | ICD-10-CM

## 2021-09-03 NOTE — Telephone Encounter (Signed)
FYI: just faxed it.

## 2021-09-03 NOTE — Telephone Encounter (Signed)
Letter has been drafted for appeal of sandostatin.    Dena, please fax this letter Attention to: [employer]. Fax expedited appeals: [phone].    It needs to be sent this week. Our deadline for appeal is Nov 2nd, so as long as it is sent now, we should have an answer soon! Thank you!

## 2021-09-03 NOTE — Progress Notes (Signed)
Letter has been drafted for appeal of sandostatin.   [name], please fax this letter Attention to: [employer]. Fax expedited appeals: [phone].   It needs to be sent this week. Our deadline for appeal is Nov 2nd, so as long as it is sent now, we should have an answer soon! Thank you!

## 2021-09-10 DIAGNOSIS — Z95818 Presence of other cardiac implants and grafts: Secondary | ICD-10-CM

## 2021-09-10 HISTORY — PX: LEFT ATRIAL APPENDAGE OCCLUSION: SHX173A

## 2021-09-11 NOTE — Telephone Encounter (Signed)
Referral sent to [facility]

## 2021-09-11 NOTE — Telephone Encounter (Signed)
Do you want him to be seen in the clinic or just for the procedure?

## 2021-09-11 NOTE — Telephone Encounter (Signed)
noted 

## 2021-09-11 NOTE — Telephone Encounter (Signed)
I suspect due to his complex history, he will need to be seen in office there first.

## 2021-09-11 NOTE — Telephone Encounter (Signed)
Received denial for Octroetide.   Dena, patient is aware that I would like to refer him to [facility] for enteroscopy due to small bowel bleeding.  [facility] clinical pool: please refer to [facility] due to chronic anemia, small bowel bleeding, needs deep enteroscopy. Will need to send capsule study images to [facility]. He is on aspirin and Plavix with history of stent placement May 2022. Complicated scenario. We do not have capabilities to reach as far into small bowel as needed. Continues to have drifting Hgb but no overt GI bleeding.

## 2021-09-11 NOTE — Addendum Note (Signed)
Addended by: Cheron Every on: 09/11/2021 11:45 AM   Modules accepted: Orders

## 2021-12-23 ENCOUNTER — Other Ambulatory Visit: Payer: Self-pay

## 2021-12-23 ENCOUNTER — Telehealth (INDEPENDENT_AMBULATORY_CARE_PROVIDER_SITE_OTHER): Payer: Medicare Other | Admitting: Gastroenterology

## 2021-12-23 DIAGNOSIS — K921 Melena: Secondary | ICD-10-CM

## 2021-12-23 DIAGNOSIS — K219 Gastro-esophageal reflux disease without esophagitis: Secondary | ICD-10-CM

## 2021-12-23 NOTE — Progress Notes (Signed)
Primary Care Physician:  Gardiner Rhyme, MD  Primary GI: Dr. Abbey Chatters   Patient Location: Home   Provider Location: [facility] office   Reason for Visit: Follow-up   Persons present on the virtual encounter, with roles: Patient and NP   Total time (minutes) spent on medical discussion: 15 minutes   Due to COVID-19, visit was conducted using virtual method.  Visit was requested by patient.  Virtual Visit via MyChart Video Note Due to COVID-19, visit is conducted virtually and was requested by patient.   I connected with [name] on 01/04/22 at  3:00 PM EST by telephone and verified that I am speaking with the correct person using two identifiers.   I discussed the limitations, risks, security and privacy concerns of performing an evaluation and management service by telephone and the availability of in person appointments. I also discussed with the patient that there may be a patient responsible charge related to this service. The patient expressed understanding and agreed to proceed.  Chief Complaint  Patient presents with   Follow-up     History of Present Illness: Very pleasant 56 year old male presenting today with a history of UGI bleed in May 2022 with cardiogenic shock, presenting to the ED after syncopal episode at dialysis. Hgb 4.6 on admission with melena, on chronic Eliquis for afib and had also just been started on Effient after stent placement May 2022. 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 Received total of 9 units PRBCs (3 at [facility] and 6 at [facility]). Discharge Hgb was 8.6, which is near his baseline. 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 We had prescribed monthly ocreotide, but insurance stated not FDA indicated. He was referred to [facility] and underwent enteroscopy on 12/03/21:Normal stomach, normal duodenum, AVMs in  jejunum s/p APC therapy.   He has been sending his Hgb results weekly via MyChart. These have been overall stable. He is on Plavix and aspirin. Cardiac cath tomorrow in preparation to be active on renal transplant list. He will also have to see ID, pulmonologist, and undergo CT of abdomen per transplant.   He is taking protonix BID. No abdominal pain. No overt GI bleeding. Doing well from our standpoint.   Last colonoscopy 2022 with stool in colon.    Past Medical History:  Diagnosis Date   Atrial fibrillation (HCC)    CHF (congestive heart failure) (HCC)    Chronic kidney disease    Coronary artery disease    Dysrhythmia    A-fib   H/O heart artery stent    HLD (hyperlipidemia)    Hypertension    Kidney transplanted    Pneumonia    Sleep apnea    CPAP     Past Surgical History:  Procedure Laterality Date   ABLATION     CHOLECYSTECTOMY     COLONOSCOPY WITH PROPOFOL N/A 12/09/2020   non-bleeding internal hemorrhoids, sigmoid and descending colon diverticulosis, stool in entire examined colon.    ESOPHAGOGASTRODUODENOSCOPY (EGD) WITH PROPOFOL N/A 03/31/2021   active bleeding in duodenum due to suspected AVM s/p hemostatic spray   GIVENS CAPSULE STUDY N/A 06/04/2021   Procedure: GIVENS CAPSULE STUDY;  Surgeon: Eloise Harman, DO;  Location: [facility];  Service: Endoscopy;  Laterality: N/A;  7:30am   HEMOSTASIS CONTROL  03/31/2021   Procedure: HEMOSTASIS CONTROL;  Surgeon: Thornton Park, MD;  Location:  [facility];  Service: Gastroenterology;;   HERNIA MESH REMOVAL     abdominal   SMALL BOWEL ENTEROSCOPY     Normal stomach, normal duodenum, 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   TRANSPLANTATION RENAL  2005     Current Meds  Medication Sig   acetaminophen (TYLENOL) 325 MG tablet Take 2 tablets (650 mg total) by mouth every 6 (six) hours as needed for mild pain, fever or headache.   allopurinol (ZYLOPRIM) 100 MG tablet Take 100 mg by mouth daily.   Ascorbic Acid  (VITAMIN C PO) Take 1 tablet by mouth daily.   aspirin 81 MG EC tablet Take 81 mg by mouth daily.   atorvastatin (LIPITOR) 80 MG tablet Take 80 mg by mouth daily.   Cholecalciferol (VITAMIN D) 50 MCG (2000 UT) tablet Take 2,000 Units by mouth daily.   clopidogrel (PLAVIX) 75 MG tablet Take 1 tablet (75 mg total) by mouth daily.   cyclobenzaprine (FLEXERIL) 10 MG tablet Take 1 tablet by mouth 2 (two) times daily as needed.   ferric citrate (AURYXIA) 1 GM 210 MG(Fe) tablet Take 2 tablets by mouth 3 (three) times daily with meals.   midodrine (PROAMATINE) 5 MG tablet Take 2 tablets (10 mg total) by mouth 3 (three) times daily with meals.   Multiple Vitamins-Minerals (MULTIVITAMIN WITH MINERALS) tablet Take 1 tablet by mouth daily.   pantoprazole (PROTONIX) 40 MG tablet Take 1 tablet (40 mg total) by mouth 2 (two) times daily.   predniSONE (DELTASONE) 10 MG tablet Take 10 mg by mouth daily with breakfast.   tacrolimus (PROGRAF) 0.5 MG capsule Take 0.5 mg by mouth daily.   tamsulosin (FLOMAX) 0.4 MG CAPS capsule Take 0.4 mg by mouth daily.      Family History  Problem Relation Age of Onset   Hypertension Mother    Heart attack Father    Hypertension Maternal Grandmother    Stroke Maternal Grandfather    Heart attack Paternal Uncle    Heart attack Paternal Uncle    Heart attack Paternal Aunt    Stroke Maternal Uncle     Social History   Socioeconomic History   Marital status: Married    Spouse name: Not on file   Number of children: 4   Years of education: Not on file   Highest education level: Not on file  Occupational History   Occupation: partial disabled   Occupation: host  Tobacco Use   Smoking status: Never   Smokeless tobacco: Never  Substance and Sexual Activity   Alcohol use: No   Drug use: No   Sexual activity: Not on file  Other Topics Concern   Not on file  Social History Narrative   Not on file   Social Determinants of Health   Financial Resource Strain: Not  on file  Food Insecurity: Not on file  Transportation Needs: Not on file  Physical Activity: Not on file  Stress: Not on file  Social Connections: Not on file       Review of Systems: Gen: Denies fever, chills, anorexia. Denies fatigue, weakness, weight loss.  CV: Denies chest pain, palpitations, syncope, peripheral edema, and claudication. Resp: Denies dyspnea at rest, cough, wheezing, coughing up blood, and pleurisy. GI: see HPI Derm: Denies rash, itching, dry skin Psych: Denies depression, anxiety, memory loss, confusion. No homicidal or suicidal ideation.  Heme: Denies bruising, bleeding, and enlarged lymph nodes.  Observations/Objective: No distress. Unable to perform physical exam due to video encounter.   Assessment  and Plan: Very pleasant 56 year old male presenting today with a history of UGI bleed in May 2022 with cardiogenic shock, undergoing EGD with active bleeding in duodenum due to suspected AVM. He underwent enteroscopy at [facility] in Jan 2023 with 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  He is doing well currently, without further overt GI bleeding. Hgb remaining stable. Currently on Plavix and aspirin.  We will see him in 6 months. We will need to discuss colonoscopy at that time, as in 2022, the colon was full of stool.  I have asked him to decrease Protonix to once daily if tolerated.    Follow Up Instructions: Decrease PPI to once daily Return 6 months virtual visit.    I discussed the assessment and treatment plan with the patient. The patient was provided an opportunity to ask questions and all were answered. The patient agreed with the plan and demonstrated an understanding of the instructions.   The patient was advised to call back or seek an in-person evaluation if the symptoms worsen or if the condition fails to improve as anticipated.  I provided 15 minutes of face-to-face time during this MyChart Video encounter.  Annitta Needs, PhD, ANP-BC [facility]

## 2022-01-04 ENCOUNTER — Encounter: Payer: Self-pay | Admitting: Internal Medicine

## 2022-01-04 ENCOUNTER — Telehealth: Payer: Self-pay | Admitting: Gastroenterology

## 2022-01-04 ENCOUNTER — Encounter: Payer: Self-pay | Admitting: Gastroenterology

## 2022-01-04 NOTE — Telephone Encounter (Signed)
Patient was seen 2/8. Please arrange virtual video visit in 6 months. Thanks!

## 2022-01-04 NOTE — Patient Instructions (Signed)
Let's reduce pantoprazole to once daily.   We will see you in 6 months!  I enjoyed seeing you again today! As you know, I value our relationship and want to provide genuine, compassionate, and quality care. I welcome your feedback. If you receive a survey regarding your visit,  I greatly appreciate you taking time to fill this out. See you next time!  Annitta Needs, PhD, ANP-BC [facility]

## 2022-01-13 ENCOUNTER — Other Ambulatory Visit: Payer: Self-pay

## 2022-01-13 ENCOUNTER — Emergency Department (HOSPITAL_COMMUNITY)
Admission: EM | Admit: 2022-01-13 | Discharge: 2022-01-13 | Disposition: A | Discharge status: Home / Self Care | Payer: Medicare Other | Attending: Emergency Medicine | Admitting: Emergency Medicine

## 2022-01-13 ENCOUNTER — Emergency Department (HOSPITAL_COMMUNITY): Payer: Medicare Other

## 2022-01-13 DIAGNOSIS — Z7982 Long term (current) use of aspirin: Secondary | ICD-10-CM | POA: Insufficient documentation

## 2022-01-13 DIAGNOSIS — I8311 Varicose veins of right lower extremity with inflammation: Secondary | ICD-10-CM

## 2022-01-13 DIAGNOSIS — I83891 Varicose veins of right lower extremities with other complications: Secondary | ICD-10-CM | POA: Diagnosis not present

## 2022-01-13 DIAGNOSIS — M79651 Pain in right thigh: Secondary | ICD-10-CM | POA: Diagnosis present

## 2022-01-13 LAB — CBC WITH DIFFERENTIAL/PLATELET
Abs Immature Granulocytes: 0.03 10*3/uL (ref 0.00–0.07)
Basophils Absolute: 0.1 10*3/uL (ref 0.0–0.1)
Basophils Relative: 1 %
Eosinophils Absolute: 0.7 10*3/uL — ABNORMAL HIGH (ref 0.0–0.5)
Eosinophils Relative: 7 %
HCT: 47.3 % (ref 39.0–52.0)
Hemoglobin: 13.6 g/dL (ref 13.0–17.0)
Immature Granulocytes: 0 %
Lymphocytes Relative: 14 %
Lymphs Abs: 1.3 10*3/uL (ref 0.7–4.0)
MCH: 28.8 pg (ref 26.0–34.0)
MCHC: 28.8 g/dL — ABNORMAL LOW (ref 30.0–36.0)
MCV: 100 fL (ref 80.0–100.0)
Monocytes Absolute: 0.9 10*3/uL (ref 0.1–1.0)
Monocytes Relative: 9 %
Neutro Abs: 6.4 10*3/uL (ref 1.7–7.7)
Neutrophils Relative %: 69 %
Platelets: 252 10*3/uL (ref 150–400)
RBC: 4.73 MIL/uL (ref 4.22–5.81)
RDW: 17.7 % — ABNORMAL HIGH (ref 11.5–15.5)
WBC: 9.4 10*3/uL (ref 4.0–10.5)
nRBC: 0 % (ref 0.0–0.2)

## 2022-01-13 LAB — BASIC METABOLIC PANEL
Anion gap: 13 (ref 5–15)
BUN: 45 mg/dL — ABNORMAL HIGH (ref 6–20)
CO2: 29 mmol/L (ref 22–32)
Calcium: 7.6 mg/dL — ABNORMAL LOW (ref 8.9–10.3)
Chloride: 97 mmol/L — ABNORMAL LOW (ref 98–111)
Creatinine, Ser: 10.55 mg/dL — ABNORMAL HIGH (ref 0.61–1.24)
GFR, Estimated: 5 mL/min — ABNORMAL LOW (ref >60)
Glucose, Bld: 101 mg/dL — ABNORMAL HIGH (ref 70–99)
Potassium: 4.6 mmol/L (ref 3.5–5.1)
Sodium: 139 mmol/L (ref 135–145)

## 2022-01-13 NOTE — ED Provider Notes (Signed)
[facility] Provider Note   CSN: [id number] Arrival date & time: 01/13/22  1636     History  Chief Complaint  Patient presents with   Leg Pain    [name] is a 56 y.o. male.  He is sent in by his podiatrist for evaluation of right calf and thigh pain.  He had an ingrown toenail work done last week.  Podiatry thought that it was healing well but he is got new pain in his calf and some warmth.  No chest pain or shortness of breath.  No prior history of DVT.  Was supposedly sent to get an ultrasound.  Not currently on antibiotics and no fevers.  The history is provided by the patient.  Leg Pain Location:  Leg Leg location:  R leg, R upper leg and R lower leg Pain details:    Quality:  Aching   Onset quality:  Gradual   Duration:  3 days   Timing:  Intermittent   Progression:  Unchanged Chronicity:  New Relieved by:  None tried Worsened by:  Bearing weight Ineffective treatments:  None tried Associated symptoms: no decreased ROM, no fever and no numbness       Home Medications Prior to Admission medications   Medication Sig Start Date End Date Taking? Authorizing Provider  acetaminophen (TYLENOL) 325 MG tablet Take 2 tablets (650 mg total) by mouth every 6 (six) hours as needed for mild pain, fever or headache. 04/07/21   Elgergawy, Silver Huguenin, MD  allopurinol (ZYLOPRIM) 100 MG tablet Take 100 mg by mouth daily. 05/22/21   [provider]  Ascorbic Acid (VITAMIN C PO) Take 1 tablet by mouth daily.    [provider]  aspirin 81 MG EC tablet Take 81 mg by mouth daily.    [provider]  atorvastatin (LIPITOR) 80 MG tablet Take 80 mg by mouth daily.    [provider]  Cholecalciferol (VITAMIN D) 50 MCG (2000 UT) tablet Take 2,000 Units by mouth daily.    [provider]  clopidogrel (PLAVIX) 75 MG tablet Take 1 tablet (75 mg total) by mouth daily. 04/08/21   Elgergawy, Silver Huguenin, MD  cyclobenzaprine (FLEXERIL) 10  MG tablet Take 1 tablet by mouth 2 (two) times daily as needed. 06/11/21   [provider]  ferric citrate (AURYXIA) 1 GM 210 MG(Fe) tablet Take 2 tablets by mouth 3 (three) times daily with meals. 09/17/20   [provider]  midodrine (PROAMATINE) 5 MG tablet Take 2 tablets (10 mg total) by mouth 3 (three) times daily with meals. 04/07/21   Elgergawy, Silver Huguenin, MD  Multiple Vitamins-Minerals (MULTIVITAMIN WITH MINERALS) tablet Take 1 tablet by mouth daily.    [provider]  pantoprazole (PROTONIX) 40 MG tablet Take 1 tablet (40 mg total) by mouth 2 (two) times daily. 04/07/21   Elgergawy, Silver Huguenin, MD  predniSONE (DELTASONE) 10 MG tablet Take 10 mg by mouth daily with breakfast.    [provider]  tacrolimus (PROGRAF) 0.5 MG capsule Take 0.5 mg by mouth daily.    [provider]  tamsulosin (FLOMAX) 0.4 MG CAPS capsule Take 0.4 mg by mouth daily.     [provider]  Wound Dressings (HYDROFERA BLUE FOAM DRESSING EX) Apply 1 patch topically every other day.    [provider]      Allergies    Codeine and Vancomycin    Review of Systems   Review of Systems  Constitutional:  Negative for fever.  HENT:  Negative for sore throat.   Respiratory:  Negative for shortness of breath.   Cardiovascular:  Negative for chest pain.  Gastrointestinal:  Negative for abdominal pain.  Genitourinary:  Negative for dysuria.  Skin:  Positive for wound. Negative for rash.  Neurological:  Negative for weakness and numbness.   Physical Exam Updated Vital Signs BP (!) 95/57    Pulse 81    Temp 98.1 F (36.7 C) (Oral)    Resp 20    Wt 96 kg    SpO2 100%    BMI 27.92 kg/m  Physical Exam Vitals and nursing note reviewed.  Constitutional:      General: He is not in acute distress.    Appearance: Normal appearance. He is well-developed.  HENT:     Head: Normocephalic and atraumatic.  Eyes:     Conjunctiva/sclera: Conjunctivae normal.   Cardiovascular:     Rate and Rhythm: Normal rate and regular rhythm.     Heart sounds: No murmur heard. Pulmonary:     Effort: Pulmonary effort is normal. No respiratory distress.     Breath sounds: Normal breath sounds.  Abdominal:     Palpations: Abdomen is soft.     Tenderness: There is no abdominal tenderness.  Musculoskeletal:        General: Tenderness present. No swelling.     Cervical back: Neck supple.     Comments: He has some tenderness in his right calf and right medial thigh.  No significant erythema or warmth.    Skin:    General: Skin is warm and dry.     Capillary Refill: Capillary refill takes less than 2 seconds.  Neurological:     General: No focal deficit present.     Mental Status: He is alert.    ED Results / Procedures / Treatments   Labs (all labs ordered are listed, but only abnormal results are displayed) Labs Reviewed  BASIC METABOLIC PANEL - Abnormal; Notable for the following components:      Result Value   Chloride 97 (*)    Glucose, Bld 101 (*)    BUN 45 (*)    Creatinine, Ser 10.55 (*)    Calcium 7.6 (*)    GFR, Estimated 5 (*)    All other components within normal limits  CBC WITH DIFFERENTIAL/PLATELET - Abnormal; Notable for the following components:   MCHC 28.8 (*)    RDW 17.7 (*)    Eosinophils Absolute 0.7 (*)    All other components within normal limits    EKG None  Radiology US Venous Img Lower Right (DVT Study)  Result Date: 01/13/2022 CLINICAL DATA:  Right lower extremity pain and redness. EXAM: Right LOWER EXTREMITY VENOUS DOPPLER ULTRASOUND TECHNIQUE: Gray-scale sonography with compression, as well as color and duplex ultrasound, were performed to evaluate the deep venous system(s) from the level of the common femoral vein through the popliteal and proximal calf veins. COMPARISON:  None. FINDINGS: VENOUS Normal compressibility of the common femoral, superficial femoral, and popliteal veins, as well as the visualized calf  veins. Visualized portions of profunda femoral vein and great saphenous vein unremarkable. No filling defects to suggest DVT on grayscale or color Doppler imaging. Doppler waveforms show normal direction of venous flow, normal respiratory plasticity and response to augmentation. Limited views of the contralateral common femoral vein are unremarkable. OTHER There is atherosclerotic calcification of the visualized arteries of the leg. The palpable painful area in the posterior right mid  thigh appears to correspond to a thrombosed varicose vein. Limitations: none IMPRESSION: 1. No evidence of DVT. 2. Palpable painful lump corresponds to a thrombosed varicose vein. 3.  Atherosclerotic calcification of the visualized arteries. Electronically Signed   By: Anner Crete M.D.   On: 01/13/2022 17:39    Procedures Procedures    Medications Ordered in ED Medications - No data to display  ED Course/ Medical Decision Making/ A&P Clinical Course as of 01/14/22 0928  Wed Jan 13, 2022  1814 Reviewed prior blood pressures.  He tends to trend low at baseline.  Asymptomatic. [MB]    Clinical Course User Index [MB] Hayden Rasmussen, MD                           Medical Decision Making Amount and/or Complexity of Data Reviewed Labs: ordered. ECG/medicine tests: ordered.  This patient complains of right calf and thigh pain; this involves an extensive number of treatment Options and is a complaint that carries with it a high risk of complications and morbidity. The differential includes DVT, thrombophlebitis, cellulitis, musculoskeletal  I ordered, reviewed and interpreted labs, which included CBC with normal white count normal hemoglobin, chemistries consistent with his end-stage renal disease I ordered imaging studies which included duplex right lower extremity and I independently    visualized and interpreted imaging which showed no acute DVT.  Technologist comments upon area of superficial  thrombophlebitis were patient is symptomatic Previous records obtained and reviewed in epic including prior notes from [place] determinants considered, no significant barriers Critical Interventions: None  After the interventions stated above, I reevaluated the patient and found patient to be fairly asymptomatic at rest. Admission and further testing considered, no indications for admission at this time.  Blood pressure soft but asymptomatic.  History of low blood pressures.  Recommended symptomatic treatment at home with continuing his aspirin and using local warm compress.  Return instructions discussed          Final Clinical Impression(s) / ED Diagnoses Final diagnoses:  Varicose veins of right lower extremity with inflammation    Rx / DC Orders ED Discharge Orders     None         Hayden Rasmussen, MD 01/14/22 [id number]

## 2022-01-13 NOTE — ED Triage Notes (Signed)
Right lower posterior calf pain and swelling with red streaks x 3 days. Referred to ED by PCP. Denies sob or cp. ?

## 2022-01-13 NOTE — Discharge Instructions (Signed)
You were seen in the emergency department for evaluation of painful area on your right leg with some redness.  You had an ultrasound that showed no evidence of a DVT or blood clot.  You did have a varicose vein that showed some inflammation.  Continue your daily aspirin and you can use a warm compress to the area.  Be careful to not burn your skin.  Follow-up with your regular doctor.  Return if any worsening or concerning symptoms. ?

## 2022-06-03 IMAGING — MR MR HEAD W/O CM
15 of 16 series · 44 of 48 positions shown · non-contrast
Comparison: Head CT March 30, 2021.

CLINICAL DATA: Seizure, nontraumatic.  New onset seizure.

EXAM:
MRI HEAD WITHOUT CONTRAST
TECHNIQUE: Multiplanar, multiecho pulse sequences of the brain and surrounding
structures were obtained without intravenous contrast.

[Series 9: DWI · axial · 3.0mm · 0.88mm/px · z∈[-90,+60]mm · 4 of 104 slices shown (1 of 4)]
[im 1/104]
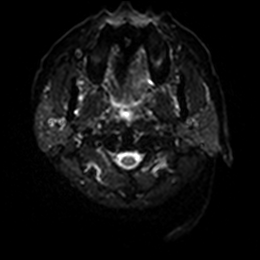
[im 35/104]
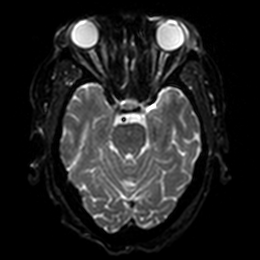
[im 69/104]
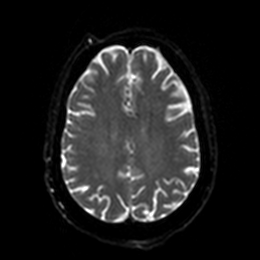
[im 104/104]
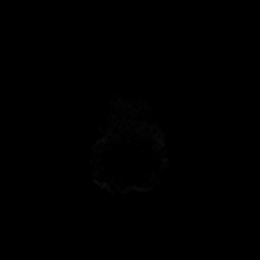

[Series 10: DWI · axial · 3.0mm · 0.88mm/px · z∈[-90,+60]mm · 2 of 52 slices shown (2 of 4)]
[im 1/52]
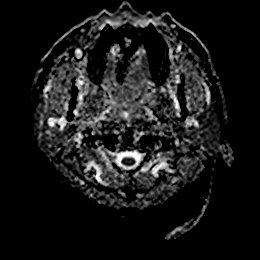
[im 52/52]
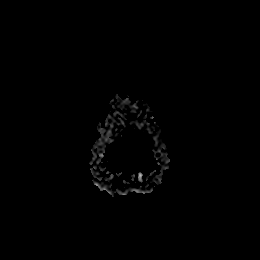

[Series 11: DWI · coronal · 4.0mm · 0.88mm/px · 4 of 76 slices shown (3 of 4)]
[im 1/76]
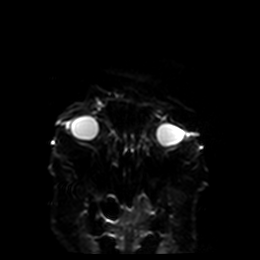
[im 26/76]
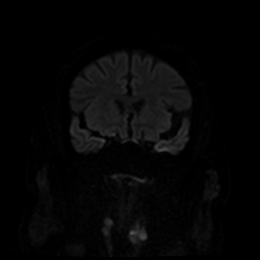
[im 51/76]
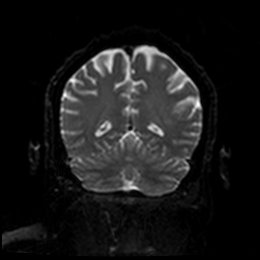
[im 76/76]
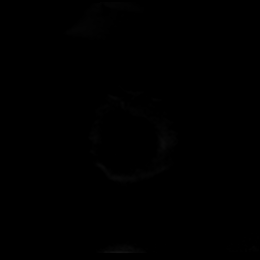

[Series 12: DWI · coronal · 4.0mm · 0.88mm/px · 2 of 38 slices shown (4 of 4)]
[im 1/38]
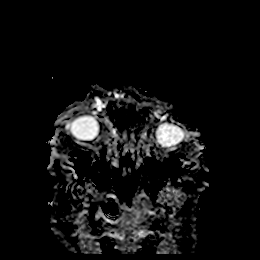
[im 38/38]
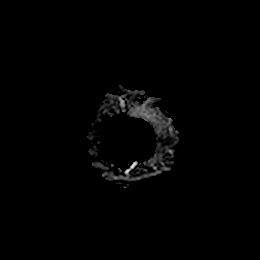

[Series 13: T1 · sagittal · 5.0mm · 0.81mm/px · 1 of 23 slices shown]
[im 1/23]
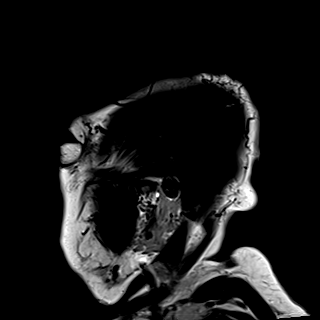

[Series 14: T2 · axial · 5.0mm · 0.72mm/px · 1 of 25 slices shown (1 of 2)]
[im 1/25]
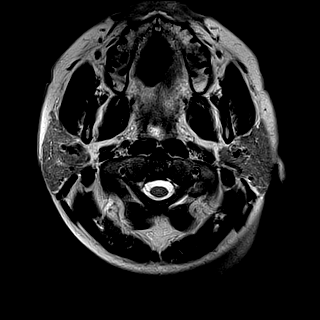

[Series 15: T2 · coronal · 3.0mm · 0.30mm/px · 2 of 36 slices shown (2 of 2)]
[im 1/36]
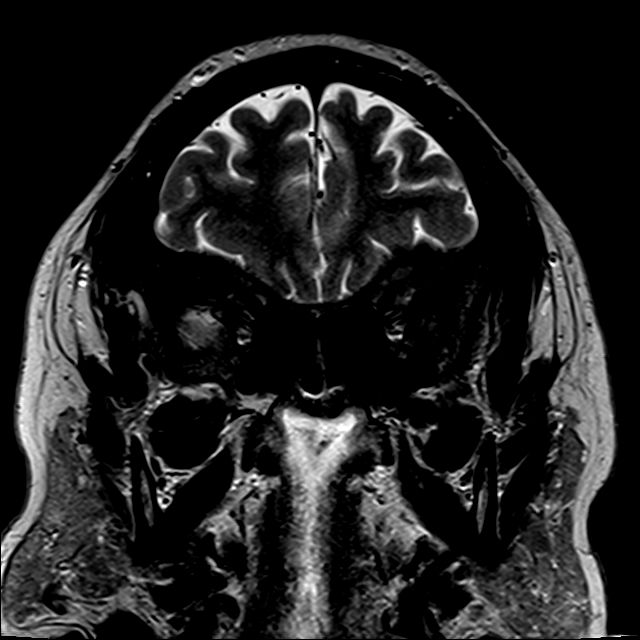
[im 36/36]
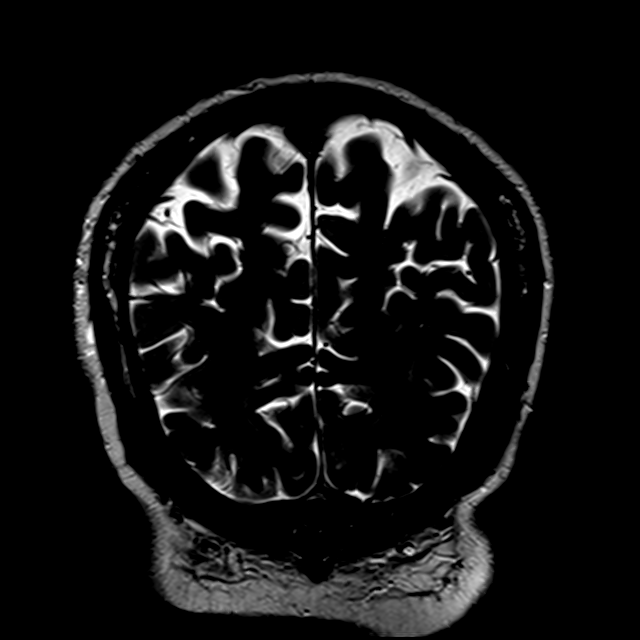

[Series 16: FLAIR · coronal · 3.0mm · 0.59mm/px · 2 of 36 slices shown (1 of 2)]
[im 1/36]
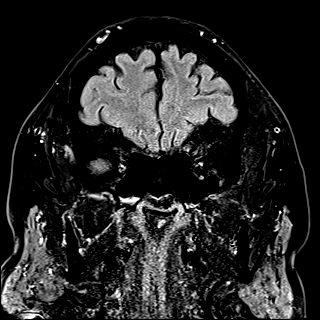
[im 36/36]
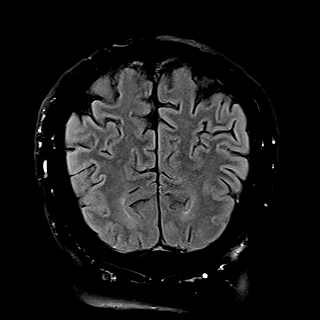

[Series 17: FLAIR · axial · 5.0mm · 0.45mm/px · 1 of 25 slices shown (2 of 2)]
[im 1/25]
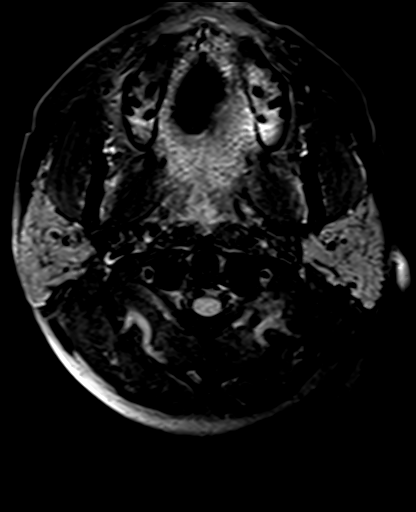

[Series 18: mag_images · axial · 3.0mm · 0.90mm/px · z∈[-93,+80]mm · 3 of 60 slices shown]
[im 1/60]
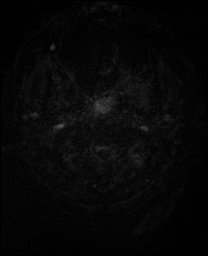
[im 30/60]
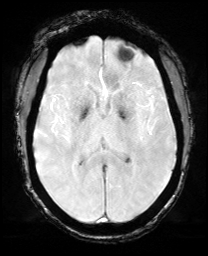
[im 60/60]
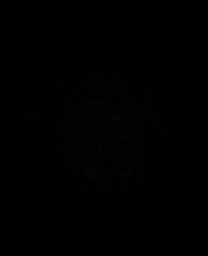

[Series 19: pha_images · axial · 3.0mm · 0.90mm/px · z∈[-93,+77]mm · 3 of 59 slices shown]
[im 1/59]
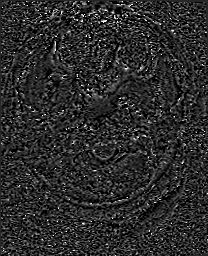
[im 30/59]
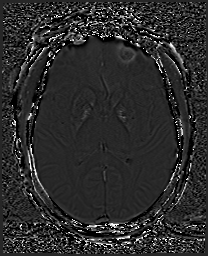
[im 59/59]
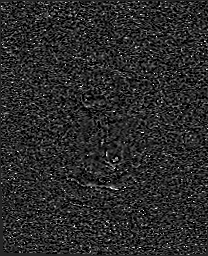

[Series 20: swi_images · axial · 3.0mm · 0.90mm/px · z∈[-93,+80]mm · 3 of 60 slices shown]
[im 1/60]
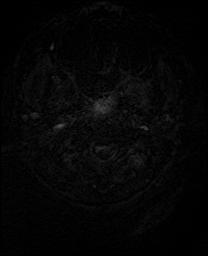
[im 30/60]
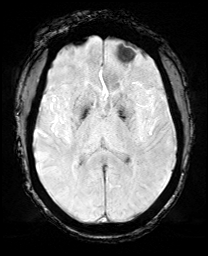
[im 60/60]
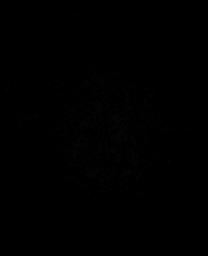

[Series 21: mip_images(sw) · axial · 24.0mm · 0.90mm/px · z∈[-83,+70]mm · 3 of 53 slices shown]
[im 1/53]
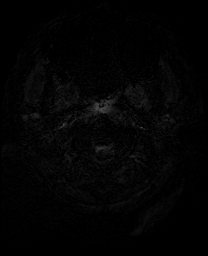
[im 27/53]
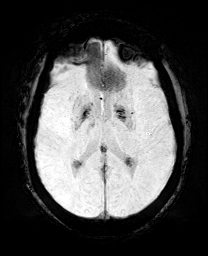
[im 53/53]
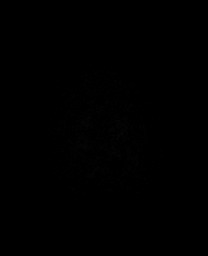

[Series 23: t1_mprage_tra_p2_iso · axial · 1.0mm · 0.98mm/px · z∈[-98,+77]mm · 8 of 175 slices shown]
[im 1/175]
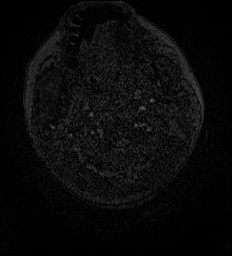
[im 25/175]
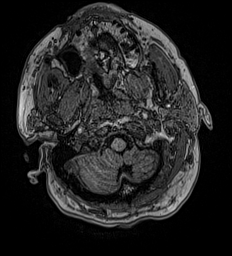
[im 50/175]
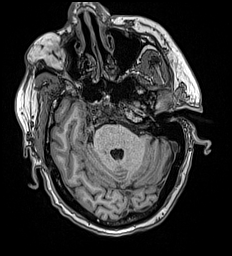
[im 75/175]
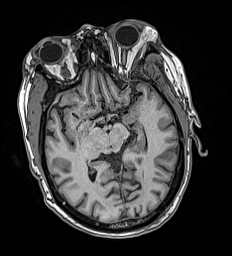
[im 100/175]
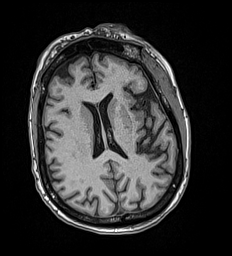
[im 125/175]
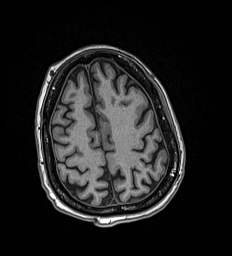
[im 150/175]
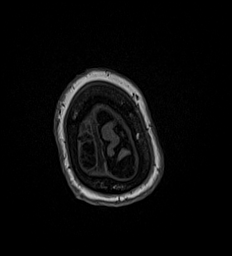
[im 175/175]
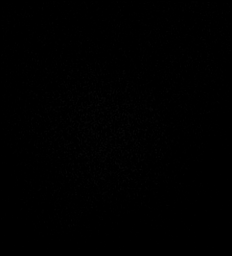

[Series 24: t1_mprage_tra_p2_iso_mpr_coronal · coronal · 1.0mm · 0.45mm/px · 5 of 120 slices shown]
[im 1/120]
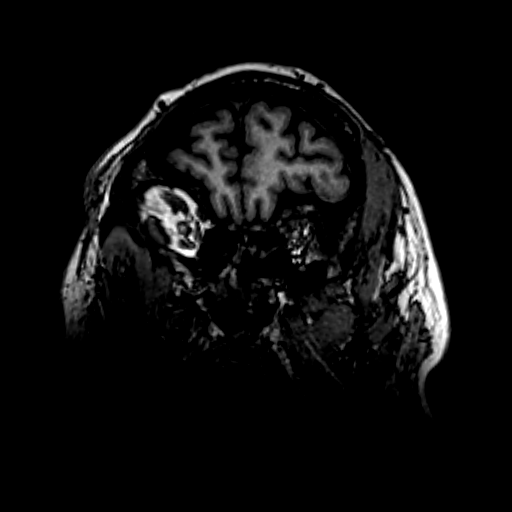
[im 24/120]
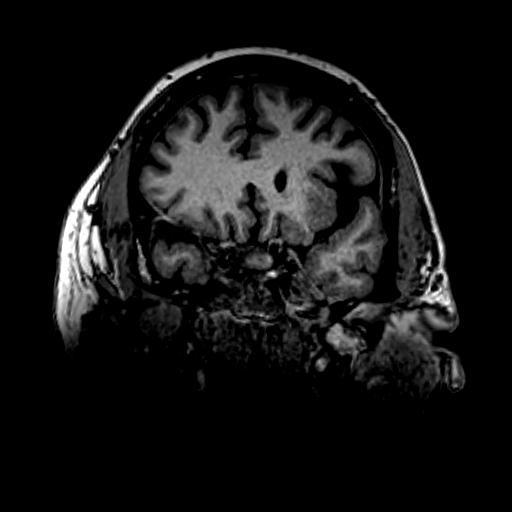
[im 48/120]
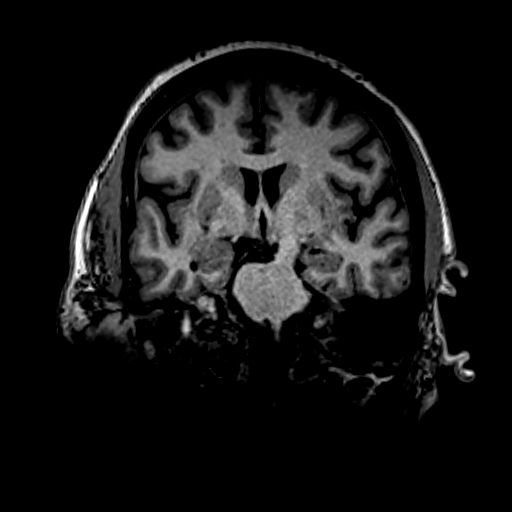
[im 72/120]
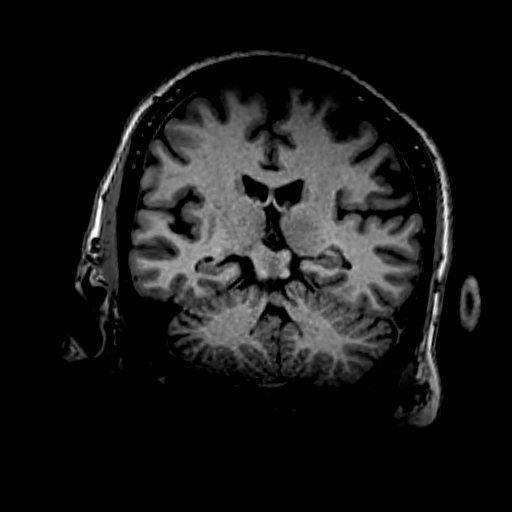
[im 96/120]
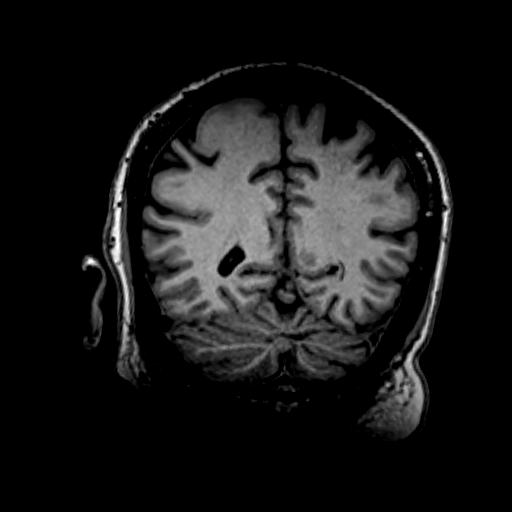

[44 of 48 positions shown; findings below may reference images not displayed]

FINDINGS: Brain: No acute infarction, hemorrhage, hydrocephalus, extra-axial
collection or mass lesion. The brain parenchyma has normal
morphology and signal characteristics. The mesial temporal lobes are
symmetric.

Vascular: Normal flow voids.

Skull and upper cervical spine: Normal marrow signal. Degenerative
changes at C4-5.

Sinuses/Orbits: Bilateral lens surgery paranasal sinuses are clear.
IMPRESSION: Unremarkable MRI of the brain.

## 2022-07-06 ENCOUNTER — Telehealth: Payer: Self-pay | Admitting: *Deleted

## 2022-07-06 ENCOUNTER — Telehealth (INDEPENDENT_AMBULATORY_CARE_PROVIDER_SITE_OTHER): Payer: Medicare Other | Admitting: Gastroenterology

## 2022-07-06 ENCOUNTER — Encounter: Payer: Self-pay | Admitting: Gastroenterology

## 2022-07-06 VITALS — Ht 73.0 in | Wt 216.0 lb

## 2022-07-06 DIAGNOSIS — K219 Gastro-esophageal reflux disease without esophagitis: Secondary | ICD-10-CM | POA: Diagnosis not present

## 2022-07-06 NOTE — Progress Notes (Signed)
Primary Care Physician:  Gardiner Rhyme, MD  Primary GI: Dr. Abbey Chatters   Patient Location: Home   Provider Location: [facility] office   Reason for Visit: Follow-up   Persons present on the virtual encounter, with roles: Patient and NP   Total time (minutes) spent on medical discussion: 10 minutes   Due to COVID-19, visit was conducted using virtual method.  Visit was requested by patient.  Virtual Visit via MyChart Video Note Due to COVID-19, visit is conducted virtually and was requested by patient.   I connected with [name] on 07/06/22 at 11:30 AM EDT by video and verified that I am speaking with the correct person using two identifiers.   I discussed the limitations, risks, security and privacy concerns of performing an evaluation and management service by video and the availability of in person appointments. I also discussed with the patient that there may be a patient responsible charge related to this service. The patient expressed understanding and agreed to proceed.  Chief Complaint  Patient presents with   Follow-up     History of Present Illness:  [name] a is pleasant 56 year old male presenting today with a history of UGI bleed in May 2022 with cardiogenic shock, presenting to the ED after syncopal episode at dialysis. Hgb 4.6 on admission with melena, on chronic Eliquis for afib and had also just been started on Effient after stent placement May 2022. 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 Received total of 9 units PRBCs (3 at [facility] and 6 at [facility]). Discharge Hgb was 8.6, which is near his baseline. 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 We had prescribed monthly ocreotide, but insurance stated not FDA indicated. 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    Hgb has been stable. Hgb around 13/14. Stools can be dark at times. Bad gas lately. Broccoli lately. No pain.   On Transplant list at [facility]. Received phone call for kidney last night but declined due to score. PPI BID. He was to decrease this to once daily. No dysphagia. No abdominal pain. Doing well from GI standpoint. Wants to hold off on colonoscopy at this time as he is at top of renal transplant list.   Past Medical History:  Diagnosis Date   Atrial fibrillation (HCC)    CHF (congestive heart failure) ([place])    Chronic kidney disease    Coronary artery disease    Dysrhythmia    A-fib   H/O heart artery stent    HLD (hyperlipidemia)    Hypertension    Kidney transplanted    Pneumonia    Sleep apnea    CPAP     Past Surgical History:  Procedure Laterality Date   ABLATION     CHOLECYSTECTOMY     COLONOSCOPY WITH PROPOFOL N/A 12/09/2020   non-bleeding internal hemorrhoids, sigmoid and descending colon diverticulosis, stool in entire examined colon.    ESOPHAGOGASTRODUODENOSCOPY (EGD) WITH PROPOFOL N/A 03/31/2021   active bleeding in duodenum due to suspected AVM s/p hemostatic spray   GIVENS CAPSULE STUDY N/A 06/04/2021   Procedure: GIVENS CAPSULE STUDY;  Surgeon: Eloise Harman, DO;  Location: [facility] ENDO SUITE;  Service: Endoscopy;  Laterality: N/A;  7:30am   HEMOSTASIS CONTROL  03/31/2021   Procedure: HEMOSTASIS  CONTROL;  Surgeon: Thornton Park, MD;  Location: [facility] ENDOSCOPY;  Service: Gastroenterology;;   HERNIA MESH REMOVAL     abdominal   SMALL BOWEL ENTEROSCOPY     Normal stomach, normal duodenum, 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   TRANSPLANTATION RENAL  2005     Current Meds  Medication Sig   acetaminophen (TYLENOL) 325 MG tablet Take 2 tablets (650 mg total) by mouth every 6 (six) hours as needed for mild pain, fever or headache.   allopurinol (ZYLOPRIM) 100 MG tablet Take 100 mg by mouth daily.   Ascorbic Acid  (VITAMIN C PO) Take 1 tablet by mouth daily.   aspirin 81 MG EC tablet Take 81 mg by mouth daily.   atorvastatin (LIPITOR) 80 MG tablet Take 80 mg by mouth daily.   Cholecalciferol (VITAMIN D) 50 MCG (2000 UT) tablet Take 2,000 Units by mouth daily.   cyclobenzaprine (FLEXERIL) 10 MG tablet Take 1 tablet by mouth 2 (two) times daily as needed.   midodrine (PROAMATINE) 5 MG tablet Take 2 tablets (10 mg total) by mouth 3 (three) times daily with meals.   Multiple Vitamins-Minerals (MULTIVITAMIN WITH MINERALS) tablet Take 1 tablet by mouth daily.   pantoprazole (PROTONIX) 40 MG tablet Take 1 tablet (40 mg total) by mouth 2 (two) times daily.   predniSONE (DELTASONE) 10 MG tablet Take 10 mg by mouth daily with breakfast.   tacrolimus (PROGRAF) 0.5 MG capsule Take 0.5 mg by mouth daily.   tamsulosin (FLOMAX) 0.4 MG CAPS capsule Take 0.4 mg by mouth daily.      Family History  Problem Relation Age of Onset   Hypertension Mother    Heart attack Father    Hypertension Maternal Grandmother    Stroke Maternal Grandfather    Heart attack Paternal Uncle    Heart attack Paternal Uncle    Heart attack Paternal Aunt    Stroke Maternal Uncle     Social History   Socioeconomic History   Marital status: Married    Spouse name: Not on file   Number of children: 4   Years of education: Not on file   Highest education level: Not on file  Occupational History   Occupation: partial disabled   Occupation: host  Tobacco Use   Smoking status: Never   Smokeless tobacco: Never  Substance and Sexual Activity   Alcohol use: No   Drug use: No   Sexual activity: Not on file  Other Topics Concern   Not on file  Social History Narrative   Not on file   Social Determinants of Health   Financial Resource Strain: Not on file  Food Insecurity: Not on file  Transportation Needs: Not on file  Physical Activity: Not on file  Stress: Not on file  Social Connections: Not on file       Review of  Systems: Gen: Denies fever, chills, anorexia. Denies fatigue, weakness, weight loss.  CV: Denies chest pain, palpitations, syncope, peripheral edema, and claudication. Resp: Denies dyspnea at rest, cough, wheezing, coughing up blood, and pleurisy. GI: see HPI Derm: Denies rash, itching, dry skin Psych: Denies depression, anxiety, memory loss, confusion. No homicidal or suicidal ideation.  Heme: Denies bruising, bleeding, and enlarged lymph nodes.  Observations/Objective: No distress. Unable to perform physical exam due to video encounter.   Assessment and Plan:  Very pleasant 56 year old male presenting today with a history of UGI bleed in May 2022 with cardiogenic shock, presenting to the ED after syncopal episode at  dialysis. Hgb 4.6 on admission with melena, on chronic Eliquis for afib and had also just been started on Effient after stent placement May 2022. 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 Received total of 9 units PRBCs (3 at [facility] and 6 at [facility]). Discharge Hgb was 8.6, which is near his baseline. 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 We had prescribed monthly ocreotide, but insurance stated not FDA indicated. He was referred to [facility] and underwent enteroscopy on 12/03/21:Normal stomach, normal duodenum, 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   Anemia resolved. Doing well at this time. Will ultimately need colonoscopy in near future as last was with stool in colon. He would like to hold off at this time as he is at the top of the renal transplant list.   We will see him in 6 months. Decrease PPI to once daily. Famotidine just as needed.    Follow Up Instructions:    I discussed the assessment and treatment plan with the patient. The patient was provided an opportunity to ask questions and all were answered. The patient agreed  with the plan and demonstrated an understanding of the instructions.   The patient was advised to call back or seek an in-person evaluation if the symptoms worsen or if the condition fails to improve as anticipated.  I provided 10 minutes of face-to-face time during this MyChart Video encounter.  Annitta Needs, PhD, ANP-BC [facility]

## 2022-07-06 NOTE — Patient Instructions (Addendum)
We will see you back in 6 months!  Please keep me updated on transplant status. I am excited for you!  You can decrease pantoprazole to once daily. Only use famotidine as needed for breakthrough reflux.   I enjoyed seeing you again today! As you know, I value our relationship and want to provide genuine, compassionate, and quality care. I welcome your feedback. If you receive a survey regarding your visit,  I greatly appreciate you taking time to fill this out. See you next time!  Annitta Needs, PhD, ANP-BC [facility]

## 2022-07-06 NOTE — Telephone Encounter (Signed)
[name], you are scheduled for a virtual visit with your provider today.  Just as we do with appointments in the office, we must obtain your consent to participate.  Your consent will be active for this visit and any virtual visit you may have with one of our providers in the next 365 days.  If you have a MyChart account, I can also send a copy of this consent to you electronically.  All virtual visits are billed to your insurance company just like a traditional visit in the office.  As this is a virtual visit, video technology does not allow for your provider to perform a traditional examination.  This may limit your provider's ability to fully assess your condition.  If your provider identifies any concerns that need to be evaluated in person or the need to arrange testing such as labs, EKG, etc, we will make arrangements to do so.  Although advances in technology are sophisticated, we cannot ensure that it will always work on either your end or our end.  If the connection with a video visit is poor, we may have to switch to a telephone visit.  With either a video or telephone visit, we are not always able to ensure that we have a secure connection.   I need to obtain your verbal consent now.   Are you willing to proceed with your visit today? Patient consent to virtual visit.

## 2022-08-27 ENCOUNTER — Emergency Department (HOSPITAL_COMMUNITY): Payer: Medicare Other

## 2022-08-27 ENCOUNTER — Other Ambulatory Visit: Payer: Self-pay

## 2022-08-27 ENCOUNTER — Encounter (HOSPITAL_COMMUNITY): Payer: Self-pay

## 2022-08-27 ENCOUNTER — Inpatient Hospital Stay (HOSPITAL_COMMUNITY)
Admission: EM | Admit: 2022-08-27 | Discharge: 2022-08-30 | DRG: 299 | Disposition: A | Discharge status: Home / Self Care | Payer: Medicare Other | Attending: Internal Medicine | Admitting: Internal Medicine

## 2022-08-27 DIAGNOSIS — E611 Iron deficiency: Secondary | ICD-10-CM | POA: Diagnosis present

## 2022-08-27 DIAGNOSIS — I5032 Chronic diastolic (congestive) heart failure: Secondary | ICD-10-CM | POA: Diagnosis present

## 2022-08-27 DIAGNOSIS — Z881 Allergy status to other antibiotic agents status: Secondary | ICD-10-CM

## 2022-08-27 DIAGNOSIS — I132 Hypertensive heart and chronic kidney disease with heart failure and with stage 5 chronic kidney disease, or end stage renal disease: Secondary | ICD-10-CM | POA: Diagnosis present

## 2022-08-27 DIAGNOSIS — K31819 Angiodysplasia of stomach and duodenum without bleeding: Secondary | ICD-10-CM | POA: Diagnosis present

## 2022-08-27 DIAGNOSIS — I959 Hypotension, unspecified: Secondary | ICD-10-CM | POA: Insufficient documentation

## 2022-08-27 DIAGNOSIS — D631 Anemia in chronic kidney disease: Secondary | ICD-10-CM | POA: Diagnosis present

## 2022-08-27 DIAGNOSIS — Z955 Presence of coronary angioplasty implant and graft: Secondary | ICD-10-CM | POA: Diagnosis not present

## 2022-08-27 DIAGNOSIS — Z8249 Family history of ischemic heart disease and other diseases of the circulatory system: Secondary | ICD-10-CM

## 2022-08-27 DIAGNOSIS — I824Z2 Acute embolism and thrombosis of unspecified deep veins of left distal lower extremity: Secondary | ICD-10-CM | POA: Diagnosis not present

## 2022-08-27 DIAGNOSIS — Z7952 Long term (current) use of systemic steroids: Secondary | ICD-10-CM

## 2022-08-27 DIAGNOSIS — E875 Hyperkalemia: Secondary | ICD-10-CM | POA: Insufficient documentation

## 2022-08-27 DIAGNOSIS — Z1152 Encounter for screening for COVID-19: Secondary | ICD-10-CM | POA: Diagnosis not present

## 2022-08-27 DIAGNOSIS — N2581 Secondary hyperparathyroidism of renal origin: Secondary | ICD-10-CM | POA: Diagnosis present

## 2022-08-27 DIAGNOSIS — I9589 Other hypotension: Secondary | ICD-10-CM | POA: Diagnosis present

## 2022-08-27 DIAGNOSIS — I4891 Unspecified atrial fibrillation: Secondary | ICD-10-CM | POA: Diagnosis present

## 2022-08-27 DIAGNOSIS — Z79899 Other long term (current) drug therapy: Secondary | ICD-10-CM | POA: Diagnosis not present

## 2022-08-27 DIAGNOSIS — N186 End stage renal disease: Secondary | ICD-10-CM | POA: Diagnosis present

## 2022-08-27 DIAGNOSIS — N400 Enlarged prostate without lower urinary tract symptoms: Secondary | ICD-10-CM | POA: Insufficient documentation

## 2022-08-27 DIAGNOSIS — Z992 Dependence on renal dialysis: Secondary | ICD-10-CM

## 2022-08-27 DIAGNOSIS — Z95818 Presence of other cardiac implants and grafts: Secondary | ICD-10-CM

## 2022-08-27 DIAGNOSIS — I82442 Acute embolism and thrombosis of left tibial vein: Secondary | ICD-10-CM | POA: Diagnosis present

## 2022-08-27 DIAGNOSIS — J9811 Atelectasis: Secondary | ICD-10-CM | POA: Diagnosis present

## 2022-08-27 DIAGNOSIS — K219 Gastro-esophageal reflux disease without esophagitis: Secondary | ICD-10-CM | POA: Diagnosis present

## 2022-08-27 DIAGNOSIS — E782 Mixed hyperlipidemia: Secondary | ICD-10-CM | POA: Diagnosis present

## 2022-08-27 DIAGNOSIS — E559 Vitamin D deficiency, unspecified: Secondary | ICD-10-CM | POA: Diagnosis not present

## 2022-08-27 DIAGNOSIS — I480 Paroxysmal atrial fibrillation: Secondary | ICD-10-CM | POA: Diagnosis present

## 2022-08-27 DIAGNOSIS — L03116 Cellulitis of left lower limb: Principal | ICD-10-CM | POA: Diagnosis present

## 2022-08-27 DIAGNOSIS — G4733 Obstructive sleep apnea (adult) (pediatric): Secondary | ICD-10-CM

## 2022-08-27 DIAGNOSIS — Z940 Kidney transplant status: Secondary | ICD-10-CM

## 2022-08-27 DIAGNOSIS — I272 Pulmonary hypertension, unspecified: Secondary | ICD-10-CM | POA: Diagnosis present

## 2022-08-27 DIAGNOSIS — Z885 Allergy status to narcotic agent status: Secondary | ICD-10-CM

## 2022-08-27 DIAGNOSIS — I2510 Atherosclerotic heart disease of native coronary artery without angina pectoris: Secondary | ICD-10-CM | POA: Diagnosis present

## 2022-08-27 DIAGNOSIS — J189 Pneumonia, unspecified organism: Secondary | ICD-10-CM

## 2022-08-27 DIAGNOSIS — I950 Idiopathic hypotension: Secondary | ICD-10-CM | POA: Diagnosis not present

## 2022-08-27 DIAGNOSIS — Z7982 Long term (current) use of aspirin: Secondary | ICD-10-CM

## 2022-08-27 DIAGNOSIS — Z823 Family history of stroke: Secondary | ICD-10-CM

## 2022-08-27 HISTORY — DX: Acute embolism and thrombosis of unspecified deep veins of left distal lower extremity: I82.4Z2

## 2022-08-27 LAB — CBC
HCT: 55.2 % — ABNORMAL HIGH (ref 39.0–52.0)
Hemoglobin: 17 g/dL (ref 13.0–17.0)
MCH: 30.8 pg (ref 26.0–34.0)
MCHC: 30.8 g/dL (ref 30.0–36.0)
MCV: 100 fL (ref 80.0–100.0)
Platelets: 212 10*3/uL (ref 150–400)
RBC: 5.52 MIL/uL (ref 4.22–5.81)
RDW: 17.1 % — ABNORMAL HIGH (ref 11.5–15.5)
WBC: 16.1 10*3/uL — ABNORMAL HIGH (ref 4.0–10.5)
nRBC: 0 % (ref 0.0–0.2)

## 2022-08-27 LAB — CBC WITH DIFFERENTIAL/PLATELET
Abs Immature Granulocytes: 0.06 10*3/uL (ref 0.00–0.07)
Basophils Absolute: 0.1 10*3/uL (ref 0.0–0.1)
Basophils Relative: 0 %
Eosinophils Absolute: 0.3 10*3/uL (ref 0.0–0.5)
Eosinophils Relative: 2 %
HCT: 54.9 % — ABNORMAL HIGH (ref 39.0–52.0)
Hemoglobin: 17 g/dL (ref 13.0–17.0)
Immature Granulocytes: 0 %
Lymphocytes Relative: 6 %
Lymphs Abs: 0.8 10*3/uL (ref 0.7–4.0)
MCH: 30.6 pg (ref 26.0–34.0)
MCHC: 31 g/dL (ref 30.0–36.0)
MCV: 98.7 fL (ref 80.0–100.0)
Monocytes Absolute: 1.3 10*3/uL — ABNORMAL HIGH (ref 0.1–1.0)
Monocytes Relative: 10 %
Neutro Abs: 10.9 10*3/uL — ABNORMAL HIGH (ref 1.7–7.7)
Neutrophils Relative %: 82 %
Platelets: 207 10*3/uL (ref 150–400)
RBC: 5.56 MIL/uL (ref 4.22–5.81)
RDW: 17.3 % — ABNORMAL HIGH (ref 11.5–15.5)
WBC: 13.4 10*3/uL — ABNORMAL HIGH (ref 4.0–10.5)
nRBC: 0 % (ref 0.0–0.2)

## 2022-08-27 LAB — COMPREHENSIVE METABOLIC PANEL
ALT: 32 U/L (ref 0–44)
AST: 31 U/L (ref 15–41)
Albumin: 3.7 g/dL (ref 3.5–5.0)
Alkaline Phosphatase: 132 U/L — ABNORMAL HIGH (ref 38–126)
Anion gap: 16 — ABNORMAL HIGH (ref 5–15)
BUN: 48 mg/dL — ABNORMAL HIGH (ref 6–20)
CO2: 24 mmol/L (ref 22–32)
Calcium: 8.3 mg/dL — ABNORMAL LOW (ref 8.9–10.3)
Chloride: 97 mmol/L — ABNORMAL LOW (ref 98–111)
Creatinine, Ser: 12.88 mg/dL — ABNORMAL HIGH (ref 0.61–1.24)
GFR, Estimated: 4 mL/min — ABNORMAL LOW (ref >60)
Glucose, Bld: 86 mg/dL (ref 70–99)
Potassium: 5.7 mmol/L — ABNORMAL HIGH (ref 3.5–5.1)
Sodium: 137 mmol/L (ref 135–145)
Total Bilirubin: 1.1 mg/dL (ref 0.3–1.2)
Total Protein: 7.2 g/dL (ref 6.5–8.1)

## 2022-08-27 LAB — RENAL FUNCTION PANEL
Albumin: 3.8 g/dL (ref 3.5–5.0)
Anion gap: 17 — ABNORMAL HIGH (ref 5–15)
BUN: 53 mg/dL — ABNORMAL HIGH (ref 6–20)
CO2: 23 mmol/L (ref 22–32)
Calcium: 8.1 mg/dL — ABNORMAL LOW (ref 8.9–10.3)
Chloride: 96 mmol/L — ABNORMAL LOW (ref 98–111)
Creatinine, Ser: 13.96 mg/dL — ABNORMAL HIGH (ref 0.61–1.24)
GFR, Estimated: 4 mL/min — ABNORMAL LOW (ref >60)
Glucose, Bld: 79 mg/dL (ref 70–99)
Phosphorus: 3 mg/dL (ref 2.5–4.6)
Potassium: 6.6 mmol/L (ref 3.5–5.1)
Sodium: 136 mmol/L (ref 135–145)

## 2022-08-27 LAB — SARS CORONAVIRUS 2 BY RT PCR: SARS Coronavirus 2 by RT PCR: NEGATIVE

## 2022-08-27 LAB — POTASSIUM: Potassium: 6.4 mmol/L (ref 3.5–5.1)

## 2022-08-27 MED ORDER — HEPARIN (PORCINE) 25000 UT/250ML-% IV SOLN
1600.0000 [IU]/h | INTRAVENOUS | Status: DC
  Administered 2022-08-27 – 2022-08-28 (×2): 1600 [IU]/h via INTRAVENOUS
  Filled 2022-08-27 (×2): qty 250

## 2022-08-27 MED ORDER — ATORVASTATIN CALCIUM 40 MG PO TABS
80.0000 mg | ORAL_TABLET | Freq: Every day | ORAL | Status: DC
  Administered 2022-08-28 – 2022-08-30 (×3): 80 mg via ORAL
  Filled 2022-08-27 (×3): qty 2

## 2022-08-27 MED ORDER — HYDROMORPHONE HCL 1 MG/ML IJ SOLN
0.5000 mg | Freq: Once | INTRAMUSCULAR | Status: AC
  Administered 2022-08-27: 0.5 mg via INTRAVENOUS
  Filled 2022-08-27: qty 0.5

## 2022-08-27 MED ORDER — INSULIN ASPART 100 UNIT/ML IV SOLN
5.0000 [IU] | Freq: Once | INTRAVENOUS | Status: AC
  Administered 2022-08-27: 5 [IU] via INTRAVENOUS

## 2022-08-27 MED ORDER — FERRIC CITRATE 1 GM 210 MG(FE) PO TABS
420.0000 mg | ORAL_TABLET | Freq: Three times a day (TID) | ORAL | Status: DC
  Administered 2022-08-28: 420 mg via ORAL
  Filled 2022-08-27 (×8): qty 2

## 2022-08-27 MED ORDER — DEXTROSE 50 % IV SOLN
1.0000 | Freq: Once | INTRAVENOUS | Status: AC
  Administered 2022-08-27: 50 mL via INTRAVENOUS
  Filled 2022-08-27: qty 50

## 2022-08-27 MED ORDER — PREDNISONE 10 MG PO TABS
10.0000 mg | ORAL_TABLET | Freq: Every day | ORAL | Status: DC
  Administered 2022-08-28 – 2022-08-30 (×3): 10 mg via ORAL
  Filled 2022-08-27 (×3): qty 1

## 2022-08-27 MED ORDER — CHLORHEXIDINE GLUCONATE CLOTH 2 % EX PADS
6.0000 | MEDICATED_PAD | Freq: Every day | CUTANEOUS | Status: DC
  Administered 2022-08-28 – 2022-08-30 (×3): 6 via TOPICAL

## 2022-08-27 MED ORDER — TAMSULOSIN HCL 0.4 MG PO CAPS
0.4000 mg | ORAL_CAPSULE | Freq: Every day | ORAL | Status: DC
  Administered 2022-08-28 – 2022-08-30 (×3): 0.4 mg via ORAL
  Filled 2022-08-27 (×3): qty 1

## 2022-08-27 MED ORDER — ALTEPLASE 2 MG IJ SOLR: 2.0000 mg | Freq: Once | INTRAMUSCULAR | Status: DC | PRN

## 2022-08-27 MED ORDER — LIDOCAINE HCL (PF) 1 % IJ SOLN: 5.0000 mL | INTRAMUSCULAR | Status: DC | PRN

## 2022-08-27 MED ORDER — HEPARIN BOLUS VIA INFUSION
5900.0000 [IU] | Freq: Once | INTRAVENOUS | Status: AC
  Administered 2022-08-27: 5900 [IU] via INTRAVENOUS

## 2022-08-27 MED ORDER — CALCIUM GLUCONATE-NACL 1-0.675 GM/50ML-% IV SOLN
1.0000 g | Freq: Once | INTRAVENOUS | Status: AC
  Administered 2022-08-27: 1000 mg via INTRAVENOUS
  Filled 2022-08-27: qty 50

## 2022-08-27 MED ORDER — SODIUM CHLORIDE 0.9 % IV BOLUS
250.0000 mL | Freq: Once | INTRAVENOUS | Status: AC
  Administered 2022-08-28: 250 mL via INTRAVENOUS

## 2022-08-27 MED ORDER — HEPARIN SODIUM (PORCINE) 5000 UNIT/ML IJ SOLN: 4000.0000 [IU] | Freq: Once | INTRAMUSCULAR | Status: DC

## 2022-08-27 MED ORDER — CALCIUM CARBONATE ANTACID 500 MG PO CHEW
1.0000 | CHEWABLE_TABLET | Freq: Two times a day (BID) | ORAL | Status: DC
  Administered 2022-08-28 – 2022-08-30 (×5): 200 mg via ORAL
  Filled 2022-08-27 (×5): qty 1

## 2022-08-27 MED ORDER — MIDODRINE HCL 5 MG PO TABS: 10.0000 mg | ORAL_TABLET | Freq: Three times a day (TID) | ORAL | Status: DC

## 2022-08-27 MED ORDER — PANTOPRAZOLE SODIUM 40 MG PO TBEC
40.0000 mg | DELAYED_RELEASE_TABLET | Freq: Two times a day (BID) | ORAL | Status: DC
  Administered 2022-08-27 – 2022-08-30 (×6): 40 mg via ORAL
  Filled 2022-08-27 (×6): qty 1

## 2022-08-27 MED ORDER — ACETAMINOPHEN 325 MG PO TABS
650.0000 mg | ORAL_TABLET | Freq: Four times a day (QID) | ORAL | Status: DC | PRN
  Administered 2022-08-27 – 2022-08-30 (×4): 650 mg via ORAL
  Filled 2022-08-27 (×4): qty 2

## 2022-08-27 MED ORDER — ANTICOAGULANT SODIUM CITRATE 4% (200MG/5ML) IV SOLN: 5.0000 mL | Status: DC | PRN

## 2022-08-27 MED ORDER — LIDOCAINE-PRILOCAINE 2.5-2.5 % EX CREA: 1.0000 | TOPICAL_CREAM | CUTANEOUS | Status: DC | PRN

## 2022-08-27 MED ORDER — ADULT MULTIVITAMIN W/MINERALS CH
1.0000 | ORAL_TABLET | Freq: Every day | ORAL | Status: DC
  Administered 2022-08-28 – 2022-08-30 (×3): 1 via ORAL
  Filled 2022-08-27 (×3): qty 1

## 2022-08-27 MED ORDER — SODIUM CHLORIDE 0.9 % IV SOLN
1.0000 g | Freq: Once | INTRAVENOUS | Status: AC
  Administered 2022-08-27: 1 g via INTRAVENOUS
  Filled 2022-08-27: qty 10

## 2022-08-27 MED ORDER — SODIUM ZIRCONIUM CYCLOSILICATE 10 G PO PACK
10.0000 g | PACK | Freq: Two times a day (BID) | ORAL | Status: DC
  Administered 2022-08-27 – 2022-08-29 (×4): 10 g via ORAL
  Filled 2022-08-27: qty 1
  Filled 2022-08-27: qty 2
  Filled 2022-08-27 (×2): qty 1

## 2022-08-27 MED ORDER — VITAMIN D 25 MCG (1000 UNIT) PO TABS
2000.0000 [IU] | ORAL_TABLET | Freq: Every day | ORAL | Status: DC
  Administered 2022-08-28 – 2022-08-30 (×3): 2000 [IU] via ORAL
  Filled 2022-08-27 (×3): qty 2

## 2022-08-27 MED ORDER — HEPARIN SODIUM (PORCINE) 1000 UNIT/ML DIALYSIS: 1000.0000 [IU] | INTRAMUSCULAR | Status: DC | PRN

## 2022-08-27 MED ORDER — PENTAFLUOROPROP-TETRAFLUOROETH EX AERO: 1.0000 | INHALATION_SPRAY | CUTANEOUS | Status: DC | PRN

## 2022-08-27 NOTE — ED Provider Notes (Cosign Needed Addendum)
[facility] EMERGENCY DEPARTMENT Provider Note   CSN: [id number] Arrival date & time: 08/27/22  1347     History  No chief complaint on file.   [name] is a 56 y.o. male.  Patient complains of pain in his left foot patient reports he has stinging and numbness in his toe patient reports the middle of his foot and his lower leg feels hot and painful.  Patient reports he went to dialysis today but he was only able to stay for an hour due to the discomfort.  Patient denies any acute shortness of breath.  Patient has a history of atrial fibrillation with RVR he is not currently on anticoagulation.  Patient has a Watchman device that was inserted in 2022.  Pt has had anticoagulation stopped due to gi bleeding in March.         Home Medications Prior to Admission medications   Medication Sig Start Date End Date Taking? Authorizing Provider  acetaminophen (TYLENOL) 325 MG tablet Take 2 tablets (650 mg total) by mouth every 6 (six) hours as needed for mild pain, fever or headache. 04/07/21   Elgergawy, Silver Huguenin, MD  allopurinol (ZYLOPRIM) 100 MG tablet Take 100 mg by mouth daily. 05/22/21   [provider]  Ascorbic Acid (VITAMIN C PO) Take 1 tablet by mouth daily.    [provider]  aspirin 81 MG EC tablet Take 81 mg by mouth daily.    [provider]  atorvastatin (LIPITOR) 80 MG tablet Take 80 mg by mouth daily.    [provider]  Cholecalciferol (VITAMIN D) 50 MCG (2000 UT) tablet Take 2,000 Units by mouth daily.    [provider]  cyclobenzaprine (FLEXERIL) 10 MG tablet Take 1 tablet by mouth 2 (two) times daily as needed. 06/11/21   [provider]  midodrine (PROAMATINE) 5 MG tablet Take 2 tablets (10 mg total) by mouth 3 (three) times daily with meals. 04/07/21   Elgergawy, Silver Huguenin, MD  Multiple Vitamins-Minerals (MULTIVITAMIN WITH MINERALS) tablet Take 1 tablet by mouth daily.    [provider]  pantoprazole  (PROTONIX) 40 MG tablet Take 1 tablet (40 mg total) by mouth 2 (two) times daily. 04/07/21   Elgergawy, Silver Huguenin, MD  predniSONE (DELTASONE) 10 MG tablet Take 10 mg by mouth daily with breakfast.    [provider]  tacrolimus (PROGRAF) 0.5 MG capsule Take 0.5 mg by mouth daily.    [provider]  tamsulosin (FLOMAX) 0.4 MG CAPS capsule Take 0.4 mg by mouth daily.     [provider]      Allergies    Codeine and Vancomycin    Review of Systems   Review of Systems  Constitutional:  Positive for fever.  Musculoskeletal:  Positive for arthralgias and joint swelling.  All other systems reviewed and are negative.   Physical Exam Updated Vital Signs BP (!) 151/102 (BP Location: Right Arm)   Pulse 92   Temp 99.1 F (37.3 C) (Oral)   Resp 18   Ht 6\' 1"  (1.854 m)   Wt 99.5 kg   SpO2 99%   BMI 28.94 kg/m  Physical Exam Vitals and nursing note reviewed.  Constitutional:      Appearance: He is well-developed.  HENT:     Head: Normocephalic.     Mouth/Throat:     Mouth: Mucous membranes are moist.  Cardiovascular:     Rate and Rhythm: Normal rate.  Pulmonary:     Effort:  Pulmonary effort is normal.  Abdominal:     General: There is no distension.  Musculoskeletal:        General: Swelling and tenderness present.     Cervical back: Normal range of motion.     Comments: Swollen hot left lower leg  Skin:    General: Skin is warm.  Neurological:     General: No focal deficit present.     Mental Status: He is alert and oriented to person, place, and time.  Psychiatric:        Mood and Affect: Mood normal.     ED Results / Procedures / Treatments   Labs (all labs ordered are listed, but only abnormal results are displayed) Labs Reviewed  CBC WITH DIFFERENTIAL/PLATELET - Abnormal; Notable for the following components:      Result Value   WBC 13.4 (*)    HCT 54.9 (*)    RDW 17.3 (*)    Neutro Abs 10.9 (*)    Monocytes Absolute 1.3 (*)    All  other components within normal limits  COMPREHENSIVE METABOLIC PANEL - Abnormal; Notable for the following components:   Potassium 5.7 (*)    Chloride 97 (*)    BUN 48 (*)    Creatinine, Ser 12.88 (*)    Calcium 8.3 (*)    Alkaline Phosphatase 132 (*)    GFR, Estimated 4 (*)    Anion gap 16 (*)    All other components within normal limits  SARS CORONAVIRUS 2 BY RT PCR    EKG EKG Interpretation  Date/Time:  Friday August 27 2022 14:33:59 EDT Ventricular Rate:  110 PR Interval:    QRS Duration: 92 QT Interval:  348 QTC Calculation: 471 R Axis:   88 Text Interpretation: Atrial fibrillation Anterolateral infarct, old Baseline wander in lead(s) V1 Abnormal ECG Confirmed by Carmin Muskrat [phone]) on 08/27/2022 3:47:35 PM  Radiology US Venous Img Lower Unilateral Left  Result Date: 08/27/2022 CLINICAL DATA:  pain EXAM: LEFT LOWER EXTREMITY VENOUS DOPPLER ULTRASOUND TECHNIQUE: Gray-scale sonography with graded compression, as well as color Doppler and duplex ultrasound were performed to evaluate the lower extremity deep venous systems from the level of the common femoral vein and including the common femoral, femoral, profunda femoral, popliteal and calf veins including the posterior tibial, peroneal and gastrocnemius veins when visible. The superficial great saphenous vein was also interrogated. Spectral Doppler was utilized to evaluate flow at rest and with distal augmentation maneuvers in the common femoral, femoral and popliteal veins. COMPARISON:  None Available. FINDINGS: Contralateral Common Femoral Vein: Respiratory phasicity is normal and symmetric with the symptomatic side. No evidence of thrombus. Normal compressibility. Common Femoral Vein: No evidence of thrombus. Normal compressibility, respiratory phasicity and response to augmentation. Saphenofemoral Junction: No evidence of thrombus. Normal compressibility and flow on color Doppler imaging. Profunda Femoral Vein: No evidence  of thrombus. Normal compressibility and flow on color Doppler imaging. Femoral Vein: No evidence of thrombus. Normal compressibility, respiratory phasicity and response to augmentation. Popliteal Vein: No evidence of thrombus. Normal compressibility, respiratory phasicity and response to augmentation. Calf Veins: Duplicated posterior tibial vein with occlusive and noncompressible thrombus within 1 of the veins. Peroneal vein appears patent. Superficial Great Saphenous Vein: No evidence of thrombus. Normal compressibility. IMPRESSION: Occlusive DVT within a left posterior tibial vein. Electronically Signed   By: Margaretha Sheffield M.D.   On: 08/27/2022 15:34   DG Chest Port 1 View  Result Date: 08/27/2022 CLINICAL DATA:  Fever EXAM: PORTABLE CHEST  1 VIEW COMPARISON:  04/04/2021, 03/31/2021, 11/15/2016 FINDINGS: Chronic pleural and parenchymal scarring on the right. Chronic elevation left diaphragm. Atelectasis versus small pneumonia left base. Multiple metallic densities over the right chest and upper extremity. Partially visualized plate and screws in the right humerus. No acute airspace disease or effusion. Stable cardiomediastinal silhouette. IMPRESSION: Atelectasis versus small pneumonia left lung base. Stable pleural and parenchymal scarring on the right. Electronically Signed   By: Donavan Foil M.D.   On: 08/27/2022 15:19   DG Foot Complete Left  Result Date: 08/27/2022 CLINICAL DATA:  Left foot swelling since yesterday. Left toes are cramping. EXAM: LEFT FOOT - COMPLETE 3+ VIEW COMPARISON:  None Available. FINDINGS: Moderate plantar calcaneal heel spur. Mild dorsal tarsometatarsal degenerative spurring. No acute fracture or dislocation. Moderate to high-grade vascular calcifications. IMPRESSION: 1. Moderate plantar calcaneal heel spur. 2. No acute fracture. Electronically Signed   By: Yvonne Kendall M.D.   On: 08/27/2022 14:31    Procedures Procedures    Medications Ordered in ED Medications -  No data to display  ED Course/ Medical Decision Making/ A&P                           Medical Decision Making Pt has severe leg pain.  Pt reports leg is hot and aches   Amount and/or Complexity of Data Reviewed Independent Historian:     Details: Pt's Mother is present  External Data Reviewed: notes.    Details: Notes from last admission reviewed  Labs: ordered. Decision-making details documented in ED Course.    Details: WBC count is 13.4  K is 5.7  Radiology: ordered and independent interpretation performed.    Details: Chest xray  shows  Foot shows heel spur Ultrasound shows small pneumonia  Discussion of management or test interpretation with external provider(s): Hospitalist consulted Dr. Josephine Cables will admit I spoke with Dr. Carlis Abbott vascular surgeon who advised no intervention is needed for DVT in tibial vein. I spoke with Dr. Marval Regal nephrologist on call he advised given the patient Lokelma 10 today and 10 tomorrow he will place patient on the schedule to have dialysis performed here tomorrow at any [facility]  Risk Prescription drug management. Decision regarding hospitalization. Risk Details: Patient may have early pneumonia he appears to have cellulitis of his left leg along with a DVT of his left leg.  Not sure if patient will be able to tolerate anticoagulation.  Patient did not have full dialysis session today.  I am concerned that he will develop volume overload his potassium is currently elevated at 5.7.  It is given IV Rocephin he is given Dilaudid for pain will speak to the hospitalist regarding admission.           Final Clinical Impression(s) / ED Diagnoses Final diagnoses:  Cellulitis of left leg  Pneumonia due to infectious organism, unspecified laterality, unspecified part of lung  Hyperkalemia  Acute deep vein thrombosis (DVT) of tibial vein of left lower extremity [facility])    Rx / [place] Orders ED Discharge Orders     None         Fransico Meadow,  PA-C 08/27/22 1729    Fransico Meadow, PA-C 08/27/22 1840    Carmin Muskrat, MD 08/30/22 [phone]

## 2022-08-27 NOTE — Progress Notes (Signed)
Patient on home BiPAP / full mask  11/15 room air

## 2022-08-27 NOTE — ED Triage Notes (Signed)
Pt reports left foot swelling since yesterday. Pt reports left toes are cramping.   Pt had dialysis today but was only able to stay for 1 hour.

## 2022-08-27 NOTE — ED Notes (Signed)
Date and time results received: 08/27/22 2051 (use smartphrase ".now" to insert current time)  Test: potassium Critical Value: 6.6  Name of Provider Notified: O.Adefeso, DO

## 2022-08-27 NOTE — Progress Notes (Signed)
Notified that pt only received 1 hour of HD due to leg pain.  Now with elevated K but in no respiratory distress.  Discussed with ED to dose Lokelma 10 grams now and again in am.  Will place HD orders for first shift tomorrow.  No need to transfer to [facility] at this time.

## 2022-08-27 NOTE — Progress Notes (Signed)
ANTICOAGULATION CONSULT NOTE - Initial Consult  Pharmacy Consult for heparin Indication: DVT  Allergies  Allergen Reactions   Codeine     Unknown reaction   Vancomycin Swelling    Patient Measurements: Height: 6\' 1"  (185.4 cm) Weight: 99.5 kg (219 lb 5.7 oz) IBW/kg (Calculated) : 79.9 Heparin Dosing Weight: 99.5 kg  Vital Signs: Temp: 99.1 F (37.3 C) (10/13 1355) Temp Source: Oral (10/13 1355) BP: 96/63 (10/13 1744) Pulse Rate: 87 (10/13 1744)  Labs: Recent Labs    08/27/22 1531  HGB 17.0  HCT 54.9*  PLT 207  CREATININE 12.88*    Estimated Creatinine Clearance: 8.1 mL/min (A) (by C-G formula based on SCr of 12.88 mg/dL (H)).   Medical History: Past Medical History:  Diagnosis Date   Atrial fibrillation (HCC)    CHF (congestive heart failure) (HCC)    Chronic kidney disease    Coronary artery disease    Dysrhythmia    A-fib   H/O heart artery stent    HLD (hyperlipidemia)    Hypertension    Kidney transplanted    Pneumonia    Sleep apnea    CPAP    Assessment: Patient is a 56 year old male admitted for L foot pain. He has a history of Afib but per admission report patient is not on anticoagulation PTA, patient has a history of a significant GI bleed in May 2022 where he required multiple PRBC transfusions. Per notes at that time, patient was on Eliquis, aspirin, and Effient but has not had any GI bleeding since.  Noted on dopplers today patient has a DVT in L leg. Baseline CBC wnl with hemoglobin 17, platelets  207. Note patient is on HD.   Goal of Therapy:  Heparin level 0.3-0.7 units/ml Monitor platelets by anticoagulation protocol: Yes   Plan:  Give 5900 units bolus x 1 Start heparin infusion at 1600 units/hr Check anti-Xa level in 8 hours (with morning labs ~3 am) and daily while on heparin Continue to monitor H&H and platelets Aim for lower range of therapeutic levels d/t history of GI bleed  F/u transition to oral anticoagulation, consider  Eliquis again    Eddie Candle, PharmD, BCPS, BCOP Clinical Pharmacist 08/27/2022,6:24 PM

## 2022-08-27 NOTE — H&P (Signed)
History and Physical    Patient: [name] KYH:[id number] DOB: [birth year] DOA: 08/27/2022 DOS: the patient was seen and examined on 08/27/2022 PCP: Gardiner Rhyme, MD  Patient coming from: Home  Chief Complaint: No chief complaint on file.  HPI: [name] is a 55 y.o. male with medical history significant of ESRD, hyperlipidemia, hypotension, A-fib with RVR, GERD, OSA on CPAP, Watchman device (2022) who presents to the emergency department due to left foot pain described as stinging and with sensation of numbness in his toe.  Dorsal part of left foot was painful.  He states that he went to dialysis today and that he was only able to complete 1 hour due to shivering, dialysis was suspended and was asked to go to the ED for further evaluation and management.    ED Course:  In the emergency department, RR 18/min, pulse 52 bpm, BP 96/63, temperature 99.27F, O2 sat 92% on room air.  Work-up in the ED showed normal CBC except for leukocytosis, BMP showed sodium 137, potassium 5.7, chloride 97, bicarb 24, glucose 86, BUN 48, creatinine 12.88.  SARS coronavirus 2 was negative, blood culture pending. Left lower extremity ultrasound showed occlusive DVT within the left posterior tibial vein Left foot x-ray showed moderate plantar calcaneal heel spur with no acute fracture Chest x-ray showed atelectasis versus small pneumonia in left lung base. Dr. Carlis Abbott  vascular surgeon at [facility] was consulted and reports no intervention for dvt in tibial vein.  No thrombectomy below popliteal, no thrombolysis. Recommended anticoagulation for 3 months and rescan per ED PA. Dr. Meredeth Ide (nephrologist) was consulted and advised 10 of lokelma tonight and 10 tomorrow.  He can do dialysis on him here at [facility] tomorrow and it adding him to the schedule Patient was started on heparin drip, Dilaudid was given, IV ceftriaxone was given, calcium gluconate was given, Lokelma was provided as recommended by nephrologist.   Hospitalist was asked to admit patient for further evaluation and management.  Review of Systems: Review of systems as noted in the HPI. All other systems reviewed and are negative.   Past Medical History:  Diagnosis Date   Atrial fibrillation (HCC)    CHF (congestive heart failure) (HCC)    Chronic kidney disease    Coronary artery disease    Dysrhythmia    A-fib   H/O heart artery stent    HLD (hyperlipidemia)    Hypertension    Kidney transplanted    Pneumonia    Sleep apnea    CPAP   Past Surgical History:  Procedure Laterality Date   ABLATION     CHOLECYSTECTOMY     COLONOSCOPY WITH PROPOFOL N/A 12/09/2020   non-bleeding internal hemorrhoids, sigmoid and descending colon diverticulosis, stool in entire examined colon.    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   TRANSPLANTATION RENAL  2005    Social History:  reports that he has never smoked. He has never used  smokeless tobacco. He reports that he does not drink alcohol and does not use drugs.   Allergies  Allergen Reactions   Codeine     Unknown reaction   Vancomycin Swelling    Family History  Problem Relation Age of Onset   Hypertension Mother    Heart attack Father    Hypertension Maternal Grandmother    Stroke Maternal Grandfather    Heart attack Paternal Uncle    Heart attack Paternal Uncle    Heart attack Paternal Aunt    Stroke Maternal Uncle       Prior to Admission medications   Medication Sig Start Date End Date Taking? Authorizing Provider  acetaminophen (TYLENOL) 325 MG tablet Take 2 tablets (650 mg total) by mouth every 6 (six) hours as needed for mild pain, fever or headache. 04/07/21  Yes Elgergawy, Silver Huguenin, MD  allopurinol (ZYLOPRIM) 100 MG tablet Take 100 mg by mouth daily. 05/22/21  Yes [provider]  Ascorbic Acid (VITAMIN C PO) Take 1 tablet by mouth daily.   Yes [provider]  aspirin 81 MG EC tablet Take 81 mg by mouth daily.   Yes [provider]  atorvastatin (LIPITOR) 80 MG tablet Take 80 mg by mouth daily.   Yes [provider]  AURYXIA 1 GM 210 MG(Fe) tablet Take 420 mg by mouth 3 (three) times daily. Take with each meal and snack 08/16/22  Yes [provider]  calcium carbonate (TUMS - DOSED IN MG ELEMENTAL CALCIUM) 500 MG chewable tablet Chew 1 tablet by mouth 2 (two) times daily with a meal.   Yes [provider]  Cholecalciferol (VITAMIN D) 50 MCG (2000 UT) tablet Take 2,000 Units by mouth daily.   Yes [provider]  cyclobenzaprine (FLEXERIL) 10 MG tablet Take 1 tablet by mouth 2 (two) times daily as needed. 06/11/21  Yes [provider]  midodrine (PROAMATINE) 5 MG tablet Take 2 tablets (10 mg total) by mouth 3 (three) times daily with meals. Patient taking differently: Take 10 mg by mouth 3 (three) times daily with meals. Take on Dialysis days Monday,Wednesday and Fridays all other days as needed 04/07/21  Yes Elgergawy, Silver Huguenin, MD  Multiple Vitamins-Minerals (MULTIVITAMIN WITH MINERALS) tablet Take 1 tablet by mouth daily.   Yes [provider]  pantoprazole (PROTONIX) 40 MG tablet Take 1 tablet (40 mg total) by mouth 2 (two) times daily. Patient taking differently: Take 40 mg by mouth daily. 04/07/21  Yes Elgergawy, Silver Huguenin, MD  predniSONE (DELTASONE) 10 MG tablet Take 10 mg by mouth daily with breakfast.   Yes [provider]  tacrolimus (PROGRAF) 0.5 MG capsule Take 0.5 mg by mouth daily.   Yes [provider]  tamsulosin (FLOMAX) 0.4 MG CAPS capsule Take 0.4 mg by mouth daily.    Yes [provider]    Physical Exam: BP 99/71   Pulse 79   Temp 99.1 F (37.3 C) (Oral)   Resp 19   Ht 6\' 1"  (1.854 m)   Wt 99.5 kg   SpO2 90%   BMI 28.94 kg/m   General: 56 y.o. year-old male well developed well nourished in no acute distress.  Alert and oriented x3. HEENT: NCAT, EOMI Neck: Supple, trachea medial Cardiovascular: Regular rate and rhythm with no rubs or gallops.  No thyromegaly or JVD noted.  2/4 pulses in all 4 extremities. Respiratory: Clear to auscultation with no wheezes or rales. Good inspiratory effort. Abdomen: Soft, nontender nondistended with  normal bowel sounds x4 quadrants. Muskuloskeletal: Dorsum of left foot was tender to palpation.  No cyanosis or clubbing  Neuro: CN II-XII intact, strength 5/5 x 4, sensation, reflexes intact Skin: No ulcerative lesions noted or rashes Psychiatry: Judgement and insight appear normal. Mood is appropriate for condition and setting          Labs on Admission:  Basic Metabolic Panel: Recent Labs  Lab 08/27/22 1531 08/27/22 1950 08/27/22 2135  NA 137 136  --   K 5.7* 6.6* 6.4*  CL 97* 96*  --   CO2 24 23  --   GLUCOSE 86 79  --   BUN 48* 53*  --   CREATININE 12.88* 13.96*  --   CALCIUM 8.3* 8.1*  --   PHOS  --  3.0  --    Liver Function Tests: Recent Labs  Lab 08/27/22 1531 08/27/22 1950  AST 31  --   ALT 32  --   ALKPHOS 132*  --   BILITOT 1.1  --   PROT 7.2  --   ALBUMIN 3.7 3.8   No results for input(s): "LIPASE", "AMYLASE" in the last 168 hours. No results for input(s): "AMMONIA" in the last 168 hours. CBC: Recent Labs  Lab 08/27/22 1531 08/27/22 1950  WBC 13.4* 16.1*  NEUTROABS 10.9*  --   HGB 17.0 17.0  HCT 54.9* 55.2*  MCV 98.7 100.0  PLT 207 212   Cardiac Enzymes: No results for input(s):  "CKTOTAL", "CKMB", "CKMBINDEX", "TROPONINI" in the last 168 hours.  BNP (last 3 results) No results for input(s): "BNP" in the last 8760 hours.  ProBNP (last 3 results) No results for input(s): "PROBNP" in the last 8760 hours.  CBG: No results for input(s): "GLUCAP" in the last 168 hours.  Radiological Exams on Admission: US Venous Img Lower Unilateral Left  Result Date: 08/27/2022 CLINICAL DATA:  pain EXAM: LEFT LOWER EXTREMITY VENOUS DOPPLER ULTRASOUND TECHNIQUE: Gray-scale sonography with graded compression, as well as color Doppler and duplex ultrasound were performed to evaluate the lower extremity deep venous systems from the level of the common femoral vein and including the common femoral, femoral, profunda femoral, popliteal and calf veins including the posterior tibial, peroneal and gastrocnemius veins when visible. The superficial great saphenous vein was also interrogated. Spectral Doppler was utilized to evaluate flow at rest and with distal augmentation maneuvers in the common femoral, femoral and popliteal veins. COMPARISON:  None Available. FINDINGS: Contralateral Common Femoral Vein: Respiratory phasicity is normal and symmetric with the symptomatic side. No evidence of thrombus. Normal compressibility. Common Femoral Vein: No evidence of thrombus. Normal compressibility, respiratory phasicity and response to augmentation. Saphenofemoral Junction: No evidence of thrombus. Normal compressibility and flow on color Doppler imaging. Profunda Femoral Vein: No evidence of thrombus. Normal compressibility and flow on color Doppler imaging. Femoral Vein: No evidence of thrombus. Normal compressibility, respiratory phasicity and response to augmentation. Popliteal Vein: No evidence of thrombus. Normal compressibility, respiratory phasicity and response to augmentation. Calf Veins: Duplicated posterior tibial vein with occlusive and noncompressible thrombus within 1 of the veins. Peroneal vein  appears patent. Superficial Great Saphenous Vein: No evidence of thrombus. Normal compressibility. IMPRESSION: Occlusive DVT within a left posterior tibial vein. Electronically Signed   By: Margaretha Sheffield M.D.   On: 08/27/2022 15:34   DG Chest Port 1 View  Result Date: 08/27/2022 CLINICAL DATA:  Fever EXAM: PORTABLE CHEST 1 VIEW COMPARISON:  04/04/2021, 03/31/2021, 11/15/2016 FINDINGS: Chronic pleural and parenchymal scarring on the right.  Chronic elevation left diaphragm. Atelectasis versus small pneumonia left base. Multiple metallic densities over the right chest and upper extremity. Partially visualized plate and screws in the right humerus. No acute airspace disease or effusion. Stable cardiomediastinal silhouette. IMPRESSION: Atelectasis versus small pneumonia left lung base. Stable pleural and parenchymal scarring on the right. Electronically Signed   By: Donavan Foil M.D.   On: 08/27/2022 15:19   DG Foot Complete Left  Result Date: 08/27/2022 CLINICAL DATA:  Left foot swelling since yesterday. Left toes are cramping. EXAM: LEFT FOOT - COMPLETE 3+ VIEW COMPARISON:  None Available. FINDINGS: Moderate plantar calcaneal heel spur. Mild dorsal tarsometatarsal degenerative spurring. No acute fracture or dislocation. Moderate to high-grade vascular calcifications. IMPRESSION: 1. Moderate plantar calcaneal heel spur. 2. No acute fracture. Electronically Signed   By: Yvonne Kendall M.D.   On: 08/27/2022 14:31    EKG: I independently viewed the EKG done and my findings are as followed: A-fib with RVR  Assessment/Plan Present on Admission:  Hyperkalemia  Atrial fibrillation with RVR (HCC)  GERD (gastroesophageal reflux disease)  Principal Problem:   Hyperkalemia Active Problems:   Atrial fibrillation with RVR (HCC)   GERD (gastroesophageal reflux disease)   ESRD on dialysis ([place])   Acute deep vein thrombosis (DVT) of distal vein of left lower extremity (HCC)   Hypotension   Mixed  hyperlipidemia   OSA on CPAP   Vitamin D deficiency   BPH (benign prostatic hyperplasia)  Hyperkalemia K+ 5.7 > 6.6 > 6.4 Calcium gluconate was given Lokelma was given per nephrology recommendation Insulin and D50 was given Continue telemetry and continue to monitor potassium levels  Left lower extremity DVT Left lower extremity ultrasound showed occlusive DVT within the left posterior tibial vein Vascular surgeon was consulted and recommended anticoagulant for 3 months and to rescan patient Patient was started on IV heparin drip with plan to transition patient to [place] in the morning (Of note, patient reported history of GI bleed when on anticoagulant, however, at that time, he was on aspirin, Plavix, NSAIDs and anticoagulant per ED PA.  Continue to monitor patient for any sign of bleeding).  Presumed CAP vs atelectasis Chest x-ray was suggestive of CAP vs atelectasis Patient endorsed chills and subjective fever, but denies shortness of breath, productive cough Procalcitonin will be checked prior to starting patient on antibiotics  ESRD on HD Patient only had 1hr of HD today Nephrology was consulted and patient will be scheduled for dialysis in the morning Continue Auryxia and calcium carbonate  Mixed hyperlipidemia Continue Lipitor  Hypotension Continue midodrine  A-fib with RVR Rate is currently controlled Patient was not on any rate control possibly due to history of hypertension He was once taken off anticoagulation due to GI bleed as explained above for LLE DVT  GERD Continue Protonix  OSA on CPAP Continue CPAP  Vitamin D deficiency Continue  cholecalciferol  BPH Continue Flomax  Order home meds: Prednisone  DVT prophylaxis: Heparin drip  Code Status: Full code  Consults: Nephrology (by ED PA)  Family Communication: None at bedside  Severity of Illness: The appropriate patient status for this patient is INPATIENT. Inpatient status is judged to be  reasonable and necessary in order to provide the required intensity of service to ensure the patient's safety. The patient's presenting symptoms, physical exam findings, and initial radiographic and laboratory data in the context of their chronic comorbidities is felt to place them at high risk for further clinical deterioration. Furthermore, it is not anticipated that  the patient will be medically stable for discharge from the hospital within 2 midnights of admission.   * I certify that at the point of admission it is my clinical judgment that the patient will require inpatient hospital care spanning beyond 2 midnights from the point of admission due to high intensity of service, high risk for further deterioration and high frequency of surveillance required.*  Author: Bernadette Hoit, DO 08/27/2022 11:03 PM  For on call review [email or web address].

## 2022-08-28 DIAGNOSIS — I5032 Chronic diastolic (congestive) heart failure: Secondary | ICD-10-CM

## 2022-08-28 LAB — CBC
HCT: 53.9 % — ABNORMAL HIGH (ref 39.0–52.0)
Hemoglobin: 16.7 g/dL (ref 13.0–17.0)
MCH: 31 pg (ref 26.0–34.0)
MCHC: 31 g/dL (ref 30.0–36.0)
MCV: 100 fL (ref 80.0–100.0)
Platelets: 155 10*3/uL (ref 150–400)
RBC: 5.39 MIL/uL (ref 4.22–5.81)
RDW: 17.2 % — ABNORMAL HIGH (ref 11.5–15.5)
WBC: 13.3 10*3/uL — ABNORMAL HIGH (ref 4.0–10.5)
nRBC: 0 % (ref 0.0–0.2)

## 2022-08-28 LAB — COMPREHENSIVE METABOLIC PANEL
ALT: 33 U/L (ref 0–44)
AST: 28 U/L (ref 15–41)
Albumin: 3.5 g/dL (ref 3.5–5.0)
Alkaline Phosphatase: 134 U/L — ABNORMAL HIGH (ref 38–126)
Anion gap: 18 — ABNORMAL HIGH (ref 5–15)
BUN: 57 mg/dL — ABNORMAL HIGH (ref 6–20)
CO2: 23 mmol/L (ref 22–32)
Calcium: 7.9 mg/dL — ABNORMAL LOW (ref 8.9–10.3)
Chloride: 95 mmol/L — ABNORMAL LOW (ref 98–111)
Creatinine, Ser: 14.25 mg/dL — ABNORMAL HIGH (ref 0.61–1.24)
GFR, Estimated: 4 mL/min — ABNORMAL LOW (ref >60)
Glucose, Bld: 74 mg/dL (ref 70–99)
Potassium: 5.6 mmol/L — ABNORMAL HIGH (ref 3.5–5.1)
Sodium: 136 mmol/L (ref 135–145)
Total Bilirubin: 1.1 mg/dL (ref 0.3–1.2)
Total Protein: 7.3 g/dL (ref 6.5–8.1)

## 2022-08-28 LAB — HIV ANTIBODY (ROUTINE TESTING W REFLEX): HIV Screen 4th Generation wRfx: NONREACTIVE

## 2022-08-28 LAB — GLUCOSE, CAPILLARY
Glucose-Capillary: 115 mg/dL — ABNORMAL HIGH (ref 70–99)
Glucose-Capillary: 118 mg/dL — ABNORMAL HIGH (ref 70–99)
Glucose-Capillary: 55 mg/dL — ABNORMAL LOW (ref 70–99)
Glucose-Capillary: 79 mg/dL (ref 70–99)

## 2022-08-28 LAB — PHOSPHORUS: Phosphorus: 5.1 mg/dL — ABNORMAL HIGH (ref 2.5–4.6)

## 2022-08-28 LAB — POTASSIUM
Potassium: 4.8 mmol/L (ref 3.5–5.1)
Potassium: 7 mmol/L (ref 3.5–5.1)

## 2022-08-28 LAB — HEPARIN LEVEL (UNFRACTIONATED)
Heparin Unfractionated: >1.1 IU/mL — ABNORMAL HIGH (ref 0.30–0.70)
Heparin Unfractionated: >1.1 IU/mL — ABNORMAL HIGH (ref 0.30–0.70)
Heparin Unfractionated: <0.1 IU/mL — ABNORMAL LOW (ref 0.30–0.70)

## 2022-08-28 LAB — MAGNESIUM: Magnesium: 2.5 mg/dL — ABNORMAL HIGH (ref 1.7–2.4)

## 2022-08-28 LAB — APTT
aPTT: >200 seconds (ref 24–36)
aPTT: >200 seconds (ref 24–36)
aPTT: 65 seconds — ABNORMAL HIGH (ref 24–36)

## 2022-08-28 LAB — MRSA NEXT GEN BY PCR, NASAL: MRSA by PCR Next Gen: NOT DETECTED

## 2022-08-28 LAB — HEPATITIS B CORE ANTIBODY, TOTAL: Hep B Core Total Ab: NONREACTIVE

## 2022-08-28 LAB — HEPATITIS B SURFACE ANTIGEN: Hepatitis B Surface Ag: NONREACTIVE

## 2022-08-28 LAB — PROCALCITONIN: Procalcitonin: 7.26 ng/mL

## 2022-08-28 LAB — HEPATITIS C ANTIBODY: HCV Ab: NONREACTIVE

## 2022-08-28 LAB — HEPATITIS B SURFACE ANTIBODY,QUALITATIVE: Hep B S Ab: REACTIVE — AB

## 2022-08-28 MED ORDER — SODIUM CHLORIDE 0.9 % IV BOLUS
250.0000 mL | Freq: Once | INTRAVENOUS | Status: AC
  Administered 2022-08-28: 250 mL via INTRAVENOUS

## 2022-08-28 MED ORDER — DILTIAZEM HCL-DEXTROSE 125-5 MG/125ML-% IV SOLN (PREMIX)
5.0000 mg/h | INTRAVENOUS | Status: DC
  Administered 2022-08-28: 5 mg/h via INTRAVENOUS
  Filled 2022-08-28: qty 125

## 2022-08-28 MED ORDER — METOPROLOL TARTRATE 25 MG PO TABS
25.0000 mg | ORAL_TABLET | Freq: Two times a day (BID) | ORAL | Status: DC
  Administered 2022-08-28 – 2022-08-29 (×2): 25 mg via ORAL
  Filled 2022-08-28 (×2): qty 1

## 2022-08-28 MED ORDER — MIDODRINE HCL 5 MG PO TABS
15.0000 mg | ORAL_TABLET | Freq: Three times a day (TID) | ORAL | Status: DC
  Administered 2022-08-28 – 2022-08-30 (×6): 15 mg via ORAL
  Filled 2022-08-28 (×6): qty 3

## 2022-08-28 MED ORDER — OXYCODONE HCL 5 MG PO TABS
5.0000 mg | ORAL_TABLET | ORAL | Status: DC | PRN
  Administered 2022-08-28 – 2022-08-30 (×6): 5 mg via ORAL
  Filled 2022-08-28 (×6): qty 1

## 2022-08-28 MED ORDER — METOPROLOL TARTRATE 5 MG/5ML IV SOLN
5.0000 mg | Freq: Once | INTRAVENOUS | Status: AC
  Administered 2022-08-28: 5 mg via INTRAVENOUS
  Filled 2022-08-28: qty 5

## 2022-08-28 MED ORDER — MIDODRINE HCL 5 MG PO TABS
10.0000 mg | ORAL_TABLET | Freq: Three times a day (TID) | ORAL | Status: DC
  Administered 2022-08-28 (×4): 10 mg via ORAL
  Filled 2022-08-28 (×4): qty 2

## 2022-08-28 MED ORDER — FERRIC CITRATE 1 GM 210 MG(FE) PO TABS
420.0000 mg | ORAL_TABLET | Freq: Three times a day (TID) | ORAL | Status: DC
  Administered 2022-08-28 – 2022-08-30 (×6): 420 mg via ORAL
  Filled 2022-08-28 (×13): qty 2

## 2022-08-28 MED ORDER — HEPARIN (PORCINE) 25000 UT/250ML-% IV SOLN
1200.0000 [IU]/h | INTRAVENOUS | Status: DC
  Administered 2022-08-29: 1200 [IU]/h via INTRAVENOUS
  Filled 2022-08-28: qty 250

## 2022-08-28 MED ORDER — DEXTROSE 50 % IV SOLN
INTRAVENOUS | Status: AC
  Administered 2022-08-28: 25 mL
  Filled 2022-08-28: qty 50

## 2022-08-28 NOTE — Progress Notes (Signed)
ANTICOAGULATION CONSULT NOTE - follow up [facility] for heparin Indication: DVT  Allergies  Allergen Reactions   Codeine     Unknown reaction   Vancomycin Swelling    Patient Measurements: Height: 6\' 1"  (185.4 cm) Weight: 99 kg (218 lb 4.1 oz) IBW/kg (Calculated) : 79.9 Heparin Dosing Weight: 99.5 kg  Vital Signs: Temp: 98 F (36.7 C) (10/14 1011) Temp Source: Oral (10/14 1011) BP: 126/95 (10/14 1045) Pulse Rate: 77 (10/14 0801)  Labs: Recent Labs    08/27/22 1531 08/27/22 1950 08/28/22 0409 08/28/22 0759  HGB 17.0 17.0 16.7  --   HCT 54.9* 55.2* 53.9*  --   PLT 207 212 155  --   APTT  --   --  >200* >200*  HEPARINUNFRC  --   --  >1.10* >1.10*  CREATININE 12.88* 13.96* 14.25*  --      Estimated Creatinine Clearance: 7.3 mL/min (A) (by C-G formula based on SCr of 14.25 mg/dL (H)).   Medical History: Past Medical History:  Diagnosis Date   Atrial fibrillation (HCC)    CHF (congestive heart failure) (HCC)    Chronic kidney disease    Coronary artery disease    Dysrhythmia    A-fib   H/O heart artery stent    HLD (hyperlipidemia)    Hypertension    Kidney transplanted    Pneumonia    Sleep apnea    CPAP    Assessment: Patient is a 56 year old male admitted for L foot pain. He has a history of Afib but per admission report patient is not on anticoagulation PTA, patient has a history of a significant GI bleed in May 2022 where he required multiple PRBC transfusions. Per notes at that time, patient was on Eliquis, aspirin, and Effient but has not had any GI bleeding since.  Noted on dopplers today patient has a DVT in L leg. Baseline CBC wnl with hemoglobin 17, platelets  207. Note patient is on HD.   Note from 10/13 third shift pharmacist that level may of been drawn from restricted arm " HL >1.1 and PTT >200 but has restricted arm so RN aware to coordinate w/ lab to hold UFH x30min prior to draw and to draw from hand or as close to hand as  possible since IV is in upper forearm" Therefore labs were repeated 10/14 AM   HL>1.10 APTT>200, supratherapeutic   Goal of Therapy:  Heparin level 0.3-0.7 units/ml Monitor platelets by anticoagulation protocol: Yes   Plan:  Hold heparin for 30 minutes Restart heparin IV at 800units/hr Recheck APTT and HL in 6 hours  Aim for lower range of therapeutic levels d/t history of GI bleed  F/u transition to oral anticoagulation  Thomasenia Sales, PharmD, MBA Clinical Pharmacist  08/28/2022,10:48 AM

## 2022-08-28 NOTE — Assessment & Plan Note (Addendum)
Left lower extremity with occlusive deep vein thrombosis within a left posterior tibial pain.   Patient was placed on heparin with good toleration He has pain at his left lower extremity.   Because history of severe GI bleed, will continue with IV heparin for the next 24 hrs, if no signs of recurrent bleeding will transition to oral apixaban. If patient has signs of bleeding will likely need IVC filter.  Add oxycodone for severe pain and continue with acetaminophen for mild pain.

## 2022-08-28 NOTE — Assessment & Plan Note (Signed)
Echocardiogram from 2021 ([name]), with preserved LV systolic function Ef 60 to 38%, mild RV systolic function reduction. Cardiac catheterization with PA mean 33.   Pulmonary hypertension class II, IV Failued sildenafil, tadalafil due to hypotension.   Follow up new echocardiogram Continue blood pressure support with midodrine.

## 2022-08-28 NOTE — Progress Notes (Signed)
Patient in atrial fibrillation with RVR post HD, despite low dose of oral metoprolol. Added IV diltiazem and increased midodrine to 15 mg tid.

## 2022-08-28 NOTE — Consult Note (Signed)
[facility] Renal Consultation Note    Indication for Consultation:  Management of ESRD/hemodialysis; anemia, hypertension/volume and secondary hyperparathyroidism  HPI: [name] is a 56 y.o. male with a PMH significant for HTN, HLD, CAD, CHF, OSA on CPAP, atrial fibrillation s/p Watchman device, off anticoagulation due to GI blee, h/o calciphylaxis, and ESRD following failed kidney transplant at [facility] MWF who presented to [facility] ED after he left HD after an hour due to severe left foot pain and an elevated temperature.  In the ED, Bp 96/63, HR 52, Temp 99.2, SpO2 92%.  Labs notable for K 5.7, gluc 86, BUN 48, Cr 12.88, covid negative.  Lower extremity duplex with occlusive DVT within the left posterior tibial vein.  He was admitted for IV heparin and we were consulted to provide dialysis during his hospitalization.  His K was treated with Lokelma and Insulin/D50, however his glucose dropped and was given orange juice to increase it.  K was 7 this morning.  He denies any N/V/D, chest pain, SOB, hemoptysis, hematochezia, melena, or BRBPR.  Past Medical History:  Diagnosis Date   Atrial fibrillation (HCC)    CHF (congestive heart failure) (HCC)    Chronic kidney disease    Coronary artery disease    Dysrhythmia    A-fib   H/O heart artery stent    HLD (hyperlipidemia)    Hypertension    Kidney transplanted    Pneumonia    Sleep apnea    CPAP   Past Surgical History:  Procedure Laterality Date   ABLATION     CHOLECYSTECTOMY     COLONOSCOPY WITH PROPOFOL N/A 12/09/2020   non-bleeding internal hemorrhoids, sigmoid and descending colon diverticulosis, stool in entire examined colon.    ESOPHAGOGASTRODUODENOSCOPY (EGD) WITH PROPOFOL N/A 03/31/2021   active bleeding in duodenum due to suspected AVM s/p hemostatic spray   GIVENS CAPSULE STUDY N/A 06/04/2021   Procedure: GIVENS CAPSULE STUDY;  Surgeon: Eloise Harman, DO;  Location: [facility] ENDO SUITE;  Service:  Endoscopy;  Laterality: N/A;  7:30am   HEMOSTASIS CONTROL  03/31/2021   Procedure: HEMOSTASIS CONTROL;  Surgeon: Thornton Park, MD;  Location: [facility] ENDOSCOPY;  Service: Gastroenterology;;   HERNIA MESH REMOVAL     abdominal   SMALL BOWEL ENTEROSCOPY     Normal stomach, normal duodenum, 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   TRANSPLANTATION RENAL  2005   Family History:   Family History  Problem Relation Age of Onset   Hypertension Mother    Heart attack Father    Hypertension Maternal Grandmother    Stroke Maternal Grandfather    Heart attack Paternal Uncle    Heart attack Paternal Uncle    Heart attack Paternal Aunt    Stroke Maternal Uncle    Social History:  reports that he has never smoked. He has never used smokeless tobacco. He reports that he does not drink alcohol and does not use drugs. Allergies  Allergen Reactions   Codeine     Unknown reaction   Vancomycin Swelling   Prior to Admission medications   Medication Sig Start Date End Date Taking? Authorizing Provider  acetaminophen (TYLENOL) 325 MG tablet Take 2 tablets (650 mg total) by mouth every 6 (six) hours as needed for mild pain, fever or headache. 04/07/21  Yes Elgergawy, Silver Huguenin, MD  allopurinol (ZYLOPRIM) 100 MG tablet Take 100 mg by mouth daily. 05/22/21  Yes [provider]  Ascorbic Acid (VITAMIN C PO) Take 1 tablet by mouth  daily.   Yes [provider]  aspirin 81 MG EC tablet Take 81 mg by mouth daily.   Yes [provider]  atorvastatin (LIPITOR) 80 MG tablet Take 80 mg by mouth daily.   Yes [provider]  AURYXIA 1 GM 210 MG(Fe) tablet Take 420 mg by mouth 3 (three) times daily. Take with each meal and snack 08/16/22  Yes [provider]  calcium carbonate (TUMS - DOSED IN MG ELEMENTAL CALCIUM) 500 MG chewable tablet Chew 1 tablet by mouth 2 (two) times daily with a meal.   Yes [provider]  Cholecalciferol (VITAMIN D) 50 MCG (2000 UT) tablet Take  2,000 Units by mouth daily.   Yes [provider]  cyclobenzaprine (FLEXERIL) 10 MG tablet Take 1 tablet by mouth 2 (two) times daily as needed. 06/11/21  Yes [provider]  midodrine (PROAMATINE) 5 MG tablet Take 2 tablets (10 mg total) by mouth 3 (three) times daily with meals. Patient taking differently: Take 10 mg by mouth 3 (three) times daily with meals. Take on Dialysis days Monday,Wednesday and Fridays all other days as needed 04/07/21  Yes Elgergawy, Silver Huguenin, MD  Multiple Vitamins-Minerals (MULTIVITAMIN WITH MINERALS) tablet Take 1 tablet by mouth daily.   Yes [provider]  pantoprazole (PROTONIX) 40 MG tablet Take 1 tablet (40 mg total) by mouth 2 (two) times daily. Patient taking differently: Take 40 mg by mouth daily. 04/07/21  Yes Elgergawy, Silver Huguenin, MD  predniSONE (DELTASONE) 10 MG tablet Take 10 mg by mouth daily with breakfast.   Yes [provider]  tacrolimus (PROGRAF) 0.5 MG capsule Take 0.5 mg by mouth daily.   Yes [provider]  tamsulosin (FLOMAX) 0.4 MG CAPS capsule Take 0.4 mg by mouth daily.    Yes [provider]   Current Facility-Administered Medications  Medication Dose Route Frequency Provider Last Rate Last Admin   acetaminophen (TYLENOL) tablet 650 mg  650 mg Oral Q6H PRN Adefeso, Oladapo, DO   650 mg at 08/27/22 2245   alteplase (CATHFLO ACTIVASE) injection 2 mg  2 mg Intracatheter Once PRN Adefeso, Oladapo, DO       anticoagulant sodium citrate solution 5 mL  5 mL Intracatheter PRN Adefeso, Oladapo, DO       atorvastatin (LIPITOR) tablet 80 mg  80 mg Oral Daily Adefeso, Oladapo, DO   80 mg at 08/28/22 0850   calcium carbonate (TUMS - dosed in mg elemental calcium) chewable tablet 200 mg of elemental calcium  1 tablet Oral BID WC Adefeso, Oladapo, DO   200 mg of elemental calcium at 08/28/22 0850   Chlorhexidine Gluconate Cloth 2 % PADS 6 each  6 each Topical Q0600 Adefeso, Oladapo, DO   6 each at 08/28/22  0554   cholecalciferol (VITAMIN D3) 25 MCG (1000 UNIT) tablet 2,000 Units  2,000 Units Oral Daily Adefeso, Oladapo, DO   2,000 Units at 08/28/22 0849   ferric citrate (AURYXIA) tablet 420 mg  420 mg Oral TID WC Arrien, Jimmy Picket, MD       heparin ADULT infusion 100 units/mL (25000 units/254mL)  800 Units/hr Intravenous Continuous Coffee, Donna Christen, RPH 8 mL/hr at 08/28/22 1129 800 Units/hr at 08/28/22 1129   heparin injection 1,000 Units  1,000 Units Intracatheter PRN Adefeso, Oladapo, DO       lidocaine (PF) (XYLOCAINE) 1 % injection 5 mL  5 mL Intradermal PRN Adefeso, Oladapo, DO       lidocaine-prilocaine (EMLA) cream 1 Application  1 Application Topical PRN Adefeso, Oladapo, DO       metoprolol tartrate (LOPRESSOR) tablet 25 mg  25 mg Oral BID Arrien, Jimmy Picket, MD   25 mg at 08/28/22 1059   midodrine (PROAMATINE) tablet 10 mg  10 mg Oral TID WC Zierle-Ghosh, Asia B, DO   10 mg at 08/28/22 1059   multivitamin with minerals tablet 1 tablet  1 tablet Oral Daily Adefeso, Oladapo, DO   1 tablet at 08/28/22 0849   oxyCODONE (Oxy IR/ROXICODONE) immediate release tablet 5 mg  5 mg Oral Q4H PRN Arrien, Jimmy Picket, MD   5 mg at 08/28/22 1010   pantoprazole (PROTONIX) EC tablet 40 mg  40 mg Oral BID Adefeso, Oladapo, DO   40 mg at 08/28/22 0850   pentafluoroprop-tetrafluoroeth (GEBAUERS) aerosol 1 Application  1 Application Topical PRN Adefeso, Oladapo, DO       predniSONE (DELTASONE) tablet 10 mg  10 mg Oral Q breakfast Adefeso, Oladapo, DO   10 mg at 08/28/22 0850   sodium zirconium cyclosilicate (LOKELMA) packet 10 g  10 g Oral BID Adefeso, Oladapo, DO   10 g at 08/28/22 0850   tamsulosin (FLOMAX) capsule 0.4 mg  0.4 mg Oral Daily Adefeso, Oladapo, DO   0.4 mg at 08/28/22 0850   Labs: Basic Metabolic Panel: Recent Labs  Lab 08/27/22 1531 08/27/22 1950 08/27/22 2135 08/28/22 0037 08/28/22 0409 08/28/22 0759  NA 137 136  --   --  136  --   K 5.7* 6.6*   < > 4.8 5.6* 7.0*  CL  97* 96*  --   --  95*  --   CO2 24 23  --   --  23  --   GLUCOSE 86 79  --   --  74  --   BUN 48* 53*  --   --  57*  --   CREATININE 12.88* 13.96*  --   --  14.25*  --   CALCIUM 8.3* 8.1*  --   --  7.9*  --   PHOS  --  3.0  --   --  5.1*  --    < > = values in this interval not displayed.   Liver Function Tests: Recent Labs  Lab 08/27/22 1531 08/27/22 1950 08/28/22 0409  AST 31  --  28  ALT 32  --  33  ALKPHOS 132*  --  134*  BILITOT 1.1  --  1.1  PROT 7.2  --  7.3  ALBUMIN 3.7 3.8 3.5   No results for input(s): "LIPASE", "AMYLASE" in the last 168 hours. No results for input(s): "AMMONIA" in the last 168 hours. CBC: Recent Labs  Lab 08/27/22 1531 08/27/22 1950 08/28/22 0409  WBC 13.4* 16.1* 13.3*  NEUTROABS 10.9*  --   --   HGB 17.0 17.0 16.7  HCT 54.9* 55.2* 53.9*  MCV 98.7 100.0 100.0  PLT 207 212 155   Cardiac Enzymes: No results for input(s): "CKTOTAL", "CKMB", "CKMBINDEX", "TROPONINI" in the last 168 hours. CBG: Recent Labs  Lab 08/28/22 0040 08/28/22 0108 08/28/22 0456 08/28/22 1206  GLUCAP 55* 115* 79 118*   Iron Studies: No results for input(s): "IRON", "TIBC", "TRANSFERRIN", "FERRITIN" in the last 72 hours. Studies/Results: US Venous Img Lower Unilateral Left  Result Date: 08/27/2022 CLINICAL DATA:  pain EXAM: LEFT LOWER EXTREMITY VENOUS DOPPLER ULTRASOUND TECHNIQUE: Gray-scale sonography with graded compression, as well as color Doppler and duplex ultrasound were performed to evaluate the lower extremity deep venous  systems from the level of the common femoral vein and including the common femoral, femoral, profunda femoral, popliteal and calf veins including the posterior tibial, peroneal and gastrocnemius veins when visible. The superficial great saphenous vein was also interrogated. Spectral Doppler was utilized to evaluate flow at rest and with distal augmentation maneuvers in the common femoral, femoral and popliteal veins. COMPARISON:  None  Available. FINDINGS: Contralateral Common Femoral Vein: Respiratory phasicity is normal and symmetric with the symptomatic side. No evidence of thrombus. Normal compressibility. Common Femoral Vein: No evidence of thrombus. Normal compressibility, respiratory phasicity and response to augmentation. Saphenofemoral Junction: No evidence of thrombus. Normal compressibility and flow on color Doppler imaging. Profunda Femoral Vein: No evidence of thrombus. Normal compressibility and flow on color Doppler imaging. Femoral Vein: No evidence of thrombus. Normal compressibility, respiratory phasicity and response to augmentation. Popliteal Vein: No evidence of thrombus. Normal compressibility, respiratory phasicity and response to augmentation. Calf Veins: Duplicated posterior tibial vein with occlusive and noncompressible thrombus within 1 of the veins. Peroneal vein appears patent. Superficial Great Saphenous Vein: No evidence of thrombus. Normal compressibility. IMPRESSION: Occlusive DVT within a left posterior tibial vein. Electronically Signed   By: Margaretha Sheffield M.D.   On: 08/27/2022 15:34   DG Chest Port 1 View  Result Date: 08/27/2022 CLINICAL DATA:  Fever EXAM: PORTABLE CHEST 1 VIEW COMPARISON:  04/04/2021, 03/31/2021, 11/15/2016 FINDINGS: Chronic pleural and parenchymal scarring on the right. Chronic elevation left diaphragm. Atelectasis versus small pneumonia left base. Multiple metallic densities over the right chest and upper extremity. Partially visualized plate and screws in the right humerus. No acute airspace disease or effusion. Stable cardiomediastinal silhouette. IMPRESSION: Atelectasis versus small pneumonia left lung base. Stable pleural and parenchymal scarring on the right. Electronically Signed   By: Donavan Foil M.D.   On: 08/27/2022 15:19   DG Foot Complete Left  Result Date: 08/27/2022 CLINICAL DATA:  Left foot swelling since yesterday. Left toes are cramping. EXAM: LEFT FOOT -  COMPLETE 3+ VIEW COMPARISON:  None Available. FINDINGS: Moderate plantar calcaneal heel spur. Mild dorsal tarsometatarsal degenerative spurring. No acute fracture or dislocation. Moderate to high-grade vascular calcifications. IMPRESSION: 1. Moderate plantar calcaneal heel spur. 2. No acute fracture. Electronically Signed   By: Yvonne Kendall M.D.   On: 08/27/2022 14:31    ROS: Pertinent items are noted in HPI. Physical Exam: Vitals:   08/28/22 1245 08/28/22 1300 08/28/22 1315 08/28/22 1330  BP: 101/83 (!) 79/45 95/77 93/73   Pulse:      Resp: (!) 21 (!) 25 (!) 21 (!) 22  Temp:      TempSrc:      SpO2:      Weight:      Height:          Weight change:   Intake/Output Summary (Last 24 hours) at 08/28/2022 1337 Last data filed at 08/28/2022 0900 Gross per 24 hour  Intake 500.88 ml  Output --  Net 500.88 ml   BP 93/73   Pulse 77   Temp 98.4 F (36.9 C) (Oral)   Resp (!) 22   Ht 6\' 1"  (1.854 m)   Wt 99 kg   SpO2 97%   BMI 28.80 kg/m  General appearance: alert, cooperative, and no distress Head: Normocephalic, without obvious abnormality, atraumatic Resp: clear to auscultation bilaterally Cardio: tachycardic at 130's GI: soft, non-tender; bowel sounds normal; no masses,  no organomegaly Extremities: extremities normal, atraumatic, no cyanosis or edema and LUE AVF +T/B, left foot tender to  palpation on dorsal aspect, no erythema or induration Dialysis Access:  Dialysis Orders:  Center:  [facility] [id number] MWF   3-1/2 hours EDW 98. HD Bath 2/2.5, Dialyzer 180, Heparin no. Access left aVF.  400 blood flow with 500 DFR-temperature 36.5 Epogen 1400 3 times weekly, Venofer 100 weekly.  Sodium thiosulfate 25 g 3 times a week   Assessment/Plan:  LLE DVT - started on heparin.  Vascular surgery consulted and recommended anticoagulation for 3 months and rescan.  Hyperkalemia - due to shortened HD treatment and OJ.  Use 1K bath for 1 hour today with HD.  ESRD -  plan for  urgent HD this morning as above  Hypertension/volume  - stable for now.  Takes chronic midodrine due to chronic hypotension.   Anemia  - No ESA for Hgb >67  Metabolic bone disease -  continue with home meds  Nutrition - renal diet  Fever - possible CAP on CX.  Abx per primary svc.  Could also come from DVT.  A fib with RVR - HR now up in the 130's.  May need Cardiology consult as he is not on anything for rate control.    OSA on CPAP - continue with CPAP qhs.  Donetta Potts, MD [place] 08/28/2022, 1:37 PM

## 2022-08-28 NOTE — Assessment & Plan Note (Addendum)
-  Presented with hyperkalemia. -Treated with Lokelma and hemodialysis therapy. -Electrolytes are stabilizing within normal limits at discharge. -Continue outpatient dialysis management, follow recommendation by nephrology service. -Patient will resume the use of his steroids and Prograf; and nephrology service will decide regarding IV iron and Epogen therapy. -Patient last hemodialysis 08/30/2022 prior to discharge; continue outpatient treatment with next anticipated dialysis on 09/01/2022 (patient on Monday-Wednesday-Friday schedule in the outpatient setting). -Continue the use of midodrine (dose has been adjusted).

## 2022-08-28 NOTE — Procedures (Signed)
I was present at this dialysis session. I have reviewed the session itself and made appropriate changes.   Vital signs in last 24 hours:  Temp:  [97.9 F (36.6 C)-99.2 F (37.3 C)] 98.4 F (36.9 C) (10/14 1201) Pulse Rate:  [75-110] 77 (10/14 0801) Resp:  [13-31] 25 (10/14 1300) BP: (80-151)/(39-104) 101/83 (10/14 1245) SpO2:  [90 %-100 %] 97 % (10/14 1011) Weight:  [98.4 kg-99.5 kg] 99 kg (10/14 1011) Weight change:  Filed Weights   08/27/22 1356 08/27/22 2220 08/28/22 1011  Weight: 99.5 kg 98.4 kg 99 kg    Recent Labs  Lab 08/28/22 0409 08/28/22 0759  NA 136  --   K 5.6* 7.0*  CL 95*  --   CO2 23  --   GLUCOSE 74  --   BUN 57*  --   CREATININE 14.25*  --   CALCIUM 7.9*  --   PHOS 5.1*  --     Recent Labs  Lab 08/27/22 1531 08/27/22 1950 08/28/22 0409  WBC 13.4* 16.1* 13.3*  NEUTROABS 10.9*  --   --   HGB 17.0 17.0 16.7  HCT 54.9* 55.2* 53.9*  MCV 98.7 100.0 100.0  PLT 207 212 155    Scheduled Meds:  atorvastatin  80 mg Oral Daily   calcium carbonate  1 tablet Oral BID WC   Chlorhexidine Gluconate Cloth  6 each Topical Q0600   cholecalciferol  2,000 Units Oral Daily   ferric citrate  420 mg Oral TID WC   metoprolol tartrate  25 mg Oral BID   midodrine  10 mg Oral TID WC   multivitamin with minerals  1 tablet Oral Daily   pantoprazole  40 mg Oral BID   predniSONE  10 mg Oral Q breakfast   sodium zirconium cyclosilicate  10 g Oral BID   tamsulosin  0.4 mg Oral Daily   Continuous Infusions:  anticoagulant sodium citrate     heparin 800 Units/hr (08/28/22 1129)   PRN Meds:.acetaminophen, alteplase, anticoagulant sodium citrate, heparin, lidocaine (PF), lidocaine-prilocaine, oxyCODONE, pentafluoroprop-tetrafluoroeth   Donetta Potts,  MD 08/28/2022, 1:13 PM

## 2022-08-28 NOTE — Procedures (Signed)
   HEMODIALYSIS TREATMENT NOTE:   3.5 hour treatment performed using left forearm AVF (15g/antegrade).  1K/2.5Ca was used for the first hour as pre-HD K 7.0.  Afib -- HR fluctuated 90s to 120 with non-sustained episodes in 140s.  "Don't worry.  It does this when I have infection."  No dyspnea or chest discomfort. Dr. Cathlean Sauer ordered oral, then IV Metoprolol for persistent tachycardia.  Minimal effect.  Cardizem infusion then ordered - was started by primary nurse at end of HD.  Goal of 2-3 liters NOT met, however pt was only 1kg over his EDW at the start of treatment - Dr. Marval Regal is aware -  1.3 liters removed this session.  All blood was returned.  Hemostasis was achieved in 20 minutes.     Rockwell Alexandria, RN

## 2022-08-28 NOTE — TOC Progression Note (Signed)
  Transition of Care [facility]) Screening Note   Patient Details  Name: [name] Date of Birth: 1965-11-27   Transition of Care [facility]) CM/SW Contact:    Shade Flood, LCSW Phone Number: 08/28/2022, 8:54 AM    Transition of Care Department [facility]) has reviewed patient and no TOC needs have been identified at this time. We will continue to monitor patient advancement through interdisciplinary progression rounds. If new patient transition needs arise, please place a TOC consult.

## 2022-08-28 NOTE — Progress Notes (Signed)
Hypoglycemic Event  CBG: 55   Treatment: 4 oz juice/soda and D50 25 mL (12.5 gm)  Symptoms: Sweaty  Follow-up CBG: Time:0102H CBG Result:115   Possible Reasons for Event: Unknown  Comments/MD notified:Dr. Hulan Amato

## 2022-08-28 NOTE — Assessment & Plan Note (Addendum)
-  Patient experience difficult to control A-fib with RVR along with ongoing hypotension. -Metoprolol dose adjusted, patient is started on amiodarone with excellent response. -Continue Eliquis for secondary prevention. -At discharge will follow amiodarone tapering and outpatient follow-up with his cardiology service. -Heart rate stable and in the 80s at discharge.

## 2022-08-28 NOTE — Hospital Course (Signed)
Mr. Glace was admitted to the hospital with the working diagnosis of acute left lower extremity DVT complicated with hyperkalemia.   56 yo male with the past medical history of ERSD, hypertension, atrial fibrillation, sp Watchman device (2022) who presented with left foot pain. He was not able to complete his HD treatment due to shivering, he was referred to the ED for further evaluation. On his initial physical examination his RR 18, BP 96/63, HR 52 and 02 saturation 92%, lungs with no wheezing or rales, heart with S1 and S2 present and rhythmic, abdomen with no distention, no lower extremity edema, left foot was tender to palpation at the dorsum.   Na 137, K 5,7 Cl 97, bicarbonate at 24, glucose 86 bun 48 cr 12.8 Wbc 13,4 hgb 17,0 plt 207  Sars covid 19 negative   Chest radiograph with mild cardiomegaly, bilateral atelectasis at bases with loculated right pleural effusion.  EKG 79 bpm, right axis, qtc 501, atrial fibrillation rhythm with no significant ST segment changes, negative T wave V1 and V2.   Left lower extremity US with occlusive DVT within a left posterior tibial vein.   Vascular surgery was consulted over the phone with recommendations to continue anticoagulation with IV heparin, no invasive vascular intervention indicated.  Nephrology was consulted for renal replacement therapy.

## 2022-08-28 NOTE — Progress Notes (Addendum)
Progress Note   Patient: [name] VVZ:[id number] DOB: 1966-02-14 DOA: 08/27/2022     1 DOS: the patient was seen and examined on 08/28/2022   Brief hospital course: [name] was admitted to the hospital with the working diagnosis of acute left lower extremity DVT complicated with hyperkalemia.   56 yo male with the past medical history of ERSD, hypertension, atrial fibrillation, sp Watchman device (2022) who presented with left foot pain. He was not able to complete his HD treatment due to shivering, he was referred to the ED for further evaluation. On his initial physical examination his RR 18, BP 96/63, HR 52 and 02 saturation 92%, lungs with no wheezing or rales, heart with S1 and S2 present and rhythmic, abdomen with no distention, no lower extremity edema, left foot was tender to palpation at the dorsum.   Na 137, K 5,7 Cl 97, bicarbonate at 24, glucose 86 bun 48 cr 12.8 Wbc 13,4 hgb 17,0 plt 207  Sars covid 19 negative   Chest radiograph with mild cardiomegaly, bilateral atelectasis at bases with loculated right pleural effusion.  EKG 79 bpm, right axis, qtc 501, atrial fibrillation rhythm with no significant ST segment changes, negative T wave V1 and V2.   Left lower extremity US with occlusive DVT within a left posterior tibial vein.   Vascular surgery was consulted over the phone with recommendations to continue anticoagulation with IV heparin, no invasive vascular intervention indicated.  Nephrology was consulted for renal replacement therapy.       Assessment and Plan: * Acute deep vein thrombosis (DVT) of distal vein of left lower extremity (HCC) Left lower extremity with occlusive deep vein thrombosis within a left posterior tibial pain.   Patient was placed on heparin with good toleration He has pain at his left lower extremity.   Because history of severe GI bleed, will continue with IV heparin for the next 24 hrs, if no signs of recurrent bleeding will  transition to oral apixaban. If patient has signs of bleeding will likely need IVC filter.  Add oxycodone for severe pain and continue with acetaminophen for mild pain.   ESRD on dialysis ([facility]) Hyperkalemia.,   Follow up K this am continue to be elevated at 7,0, with serum bicarbonate at 23. Plan for hemodialysis today. Follow up electrolytes in am.   Hypotension, continue with midodrine for blood pressure support.   Patient had renal transplant 2005, resumed HD in 2021. Continue prednisone.   Anemia of chronic renal disease/ iron deficiency Continue with iron supplementation.   Atrial fibrillation with RVR ([place]) Patient had history of upper GI bleed with cardiogenic shock in 2022, EGD with bleeding into the duodenum suspected AVM, treated with hemostatic spray. He required 9 units PRBC transfusion.  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   Plan to add metoprolol 25 mg po bid for rate control and continue anticoagulation with IV heparin, if no bleeding possible transition to oral apixaban.   Chronic diastolic CHF (congestive heart failure) (HCC) Echocardiogram from 2021 ([name]), with preserved LV systolic function Ef 60 to 54%, mild RV systolic function reduction. Cardiac catheterization with PA mean 33.   Pulmonary hypertension class II, IV Failued sildenafil, tadalafil due to hypotension.   Follow up new echocardiogram Continue blood pressure support with midodrine.   GERD (gastroesophageal reflux disease) Continue antiacid therapy  with pantoprazole Close follow up on hgb and hct   Mixed hyperlipidemia Continue with statin therapy.   OSA on CPAP Continue with Cpap at night.         Subjective: Patient continue to have pain on his left lower extremity edema, no chest pain, and no dyspnea   Physical  Exam: Vitals:   08/28/22 0900 08/28/22 1011 08/28/22 1035 08/28/22 1045  BP: (!) 91/58 (!) 125/56 111/73 (!) 126/95  Pulse:      Resp: 15 18 20 18   Temp:  98 F (36.7 C)    TempSrc:  Oral    SpO2:  97%    Weight:  99 kg    Height:       Neurology awake and alert ENT with mild pallor Cardiovascular with S1 and S2 present, tachycardic, irregularly irregular Respiratory with no rales or wheezing  Abdomen with no distention  Mild left lower extremity edema, tender to palpation, no erythema or increased local temperature  Data Reviewed:    Family Communication: I spoke with patient's family at the bedside, we talked in detail about patient's condition, plan of care and prognosis and all questions were addressed.   Disposition: Status is: Inpatient Remains inpatient appropriate because: IV heparin   Planned Discharge Destination: Home    Author: Tawni Millers, MD 08/28/2022 11:00 AM  For on call review [email or web address].

## 2022-08-28 NOTE — Assessment & Plan Note (Signed)
Continue with Cpap at night.

## 2022-08-28 NOTE — Assessment & Plan Note (Signed)
Continue antiacid therapy with pantoprazole Close follow up on hgb and hct

## 2022-08-28 NOTE — Assessment & Plan Note (Signed)
Continue with statin therapy.  ?

## 2022-08-29 DIAGNOSIS — I82442 Acute embolism and thrombosis of left tibial vein: Secondary | ICD-10-CM

## 2022-08-29 LAB — CBC
HCT: 50.8 % (ref 39.0–52.0)
Hemoglobin: 15.5 g/dL (ref 13.0–17.0)
MCH: 30.9 pg (ref 26.0–34.0)
MCHC: 30.5 g/dL (ref 30.0–36.0)
MCV: 101.2 fL — ABNORMAL HIGH (ref 80.0–100.0)
Platelets: 172 10*3/uL (ref 150–400)
RBC: 5.02 MIL/uL (ref 4.22–5.81)
RDW: 16.5 % — ABNORMAL HIGH (ref 11.5–15.5)
WBC: 12.9 10*3/uL — ABNORMAL HIGH (ref 4.0–10.5)
nRBC: 0 % (ref 0.0–0.2)

## 2022-08-29 LAB — BASIC METABOLIC PANEL
Anion gap: 14 (ref 5–15)
BUN: 44 mg/dL — ABNORMAL HIGH (ref 6–20)
CO2: 28 mmol/L (ref 22–32)
Calcium: 7.3 mg/dL — ABNORMAL LOW (ref 8.9–10.3)
Chloride: 95 mmol/L — ABNORMAL LOW (ref 98–111)
Creatinine, Ser: 11.47 mg/dL — ABNORMAL HIGH (ref 0.61–1.24)
GFR, Estimated: 5 mL/min — ABNORMAL LOW (ref >60)
Glucose, Bld: 111 mg/dL — ABNORMAL HIGH (ref 70–99)
Potassium: 4 mmol/L (ref 3.5–5.1)
Sodium: 137 mmol/L (ref 135–145)

## 2022-08-29 LAB — HEPATITIS B SURFACE ANTIBODY, QUANTITATIVE: Hep B S AB Quant (Post): 52.5 m[IU]/mL (ref >9.9)

## 2022-08-29 LAB — HEPARIN LEVEL (UNFRACTIONATED)
Heparin Unfractionated: 0.39 IU/mL (ref 0.30–0.70)
Heparin Unfractionated: 0.29 IU/mL — ABNORMAL LOW (ref 0.30–0.70)

## 2022-08-29 MED ORDER — METOPROLOL TARTRATE 50 MG PO TABS
50.0000 mg | ORAL_TABLET | Freq: Two times a day (BID) | ORAL | Status: DC
  Administered 2022-08-29: 50 mg via ORAL
  Filled 2022-08-29 (×2): qty 1

## 2022-08-29 MED ORDER — APIXABAN 5 MG PO TABS
10.0000 mg | ORAL_TABLET | Freq: Two times a day (BID) | ORAL | Status: DC
  Administered 2022-08-29 – 2022-08-30 (×2): 10 mg via ORAL
  Filled 2022-08-29 (×2): qty 2

## 2022-08-29 MED ORDER — APIXABAN 5 MG PO TABS: 5.0000 mg | ORAL_TABLET | Freq: Two times a day (BID) | ORAL | Status: DC

## 2022-08-29 MED ORDER — AMIODARONE LOAD VIA INFUSION
150.0000 mg | Freq: Once | INTRAVENOUS | Status: AC
  Administered 2022-08-29: 150 mg via INTRAVENOUS
  Filled 2022-08-29: qty 83.34

## 2022-08-29 MED ORDER — AMIODARONE HCL IN DEXTROSE 360-4.14 MG/200ML-% IV SOLN
30.0000 mg/h | INTRAVENOUS | Status: DC
  Administered 2022-08-30: 30 mg/h via INTRAVENOUS
  Filled 2022-08-29: qty 200

## 2022-08-29 MED ORDER — AMIODARONE HCL IN DEXTROSE 360-4.14 MG/200ML-% IV SOLN
60.0000 mg/h | INTRAVENOUS | Status: AC
  Administered 2022-08-29 (×2): 60 mg/h via INTRAVENOUS
  Filled 2022-08-29 (×2): qty 200

## 2022-08-29 MED ORDER — APIXABAN 5 MG PO TABS
5.0000 mg | ORAL_TABLET | Freq: Two times a day (BID) | ORAL | Status: DC
  Administered 2022-08-29: 5 mg via ORAL
  Filled 2022-08-29: qty 1

## 2022-08-29 NOTE — Progress Notes (Signed)
Progress Note   Patient: [name] [id number]:[id number] DOB: 09/07/1966 DOA: 08/27/2022     2 DOS: the patient was seen and examined on 08/29/2022   Brief hospital course: [name] was admitted to the hospital with the working diagnosis of acute left lower extremity DVT complicated with hyperkalemia.   56 yo male with the past medical history of ERSD, hypertension, atrial fibrillation, sp Watchman device (2022) who presented with left foot pain. He was not able to complete his HD treatment due to shivering, he was referred to the ED for further evaluation. On his initial physical examination his RR 18, BP 96/63, HR 52 and 02 saturation 92%, lungs with no wheezing or rales, heart with S1 and S2 present and rhythmic, abdomen with no distention, no lower extremity edema, left foot was tender to palpation at the dorsum.   Na 137, K 5,7 Cl 97, bicarbonate at 24, glucose 86 bun 48 cr 12.8 Wbc 13,4 hgb 17,0 plt 207  Sars covid 19 negative   Chest radiograph with mild cardiomegaly, bilateral atelectasis at bases with loculated right pleural effusion.  EKG 79 bpm, right axis, qtc 501, atrial fibrillation rhythm with no significant ST segment changes, negative T wave V1 and V2.   Left lower extremity US with occlusive DVT within a left posterior tibial vein.   Vascular surgery was consulted over the phone with recommendations to continue anticoagulation with IV heparin, no invasive vascular intervention indicated.  Nephrology was consulted for renal replacement therapy.     Assessment and Plan: * Acute deep vein thrombosis (DVT) of distal vein of left lower extremity (HCC) -Left lower extremity with occlusive deep vein thrombosis and complains of left posterior tibial pain.  -Patient was placed on heparin with good toleration, and no signs of overt bleeding. -At this moment after 24-36 hours of IV anticoagulation will transition to oral apixaban. -Continue the use of oxycodone for severe pain  and continue with acetaminophen for mild pain.   ESRD on dialysis [facility]) -Presenting with hyperkalemia. -Treated with Lokelma and hemodialysis therapy -Continue to follow nephrology service recommendations; plan for hemodialysis and resumption of outpatient schedule on 08/30/2022. -Continue the use of midodrine -Patient had renal transplant 2005, resumed HD in 2021. -Continue prednisone at current dose.  -Anemia of chronic renal disease/ iron deficiency: Follow-up will follow nephrology service recommendations regarding IV iron and Epogen therapy. -Continue to follow hemoglobin trend.   Atrial fibrillation with RVR (HCC) -Has remained on A-fib with RVR while experiencing hypotension. -Cardizem drip discontinued -Metoprolol dose adjusted -Patient has been started on amiodarone. -Continue telemetry monitoring -Follow electrolytes closely -Started on Eliquis.    Chronic diastolic CHF (congestive heart failure) (HCC) -Echocardiogram from 2021 ([name]), with preserved LV systolic function Ef 60 to 54%, mild RV systolic function reduction. Cardiac catheterization with PA mean 33.  -Patient with component of pulmonary hypertension class II,  -Unable to receive treatment with sildenafil or tadalafil due to hypotension.  -Currently compensated -Continue to follow strict I's and O's -Volume management with dialysis. -Continue blood pressure support with the use of midodrine.  GERD (gastroesophageal reflux disease) -Continue antiacid therapy with pantoprazole -Close follow up on hgb and hct -Patient had history of upper GI bleed with cardiogenic shock in 2022, EGD with bleeding into the duodenum suspected AVM, treated with hemostatic spray. He required 9 units PRBC transfusion.  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  01/23 [facility] pus enteroscopy with normal stomach, normal  duodenum and AVMs in jejunum sp APC therapy.    Mixed  hyperlipidemia -Continue with statin therapy.   OSA on CPAP -Continue with Cpap at night.    Subjective:  Continue complaining of pain on his left lower extremity (slightly controlled with ongoing analgesics; patient is also still demonstrating soft blood pressure and ongoing A-fib with RVR.  Physical Exam: Vitals:   08/29/22 0800 08/29/22 0900 08/29/22 1000 08/29/22 1100  BP: (!) 96/43 100/77 (!) 111/91 (!) 92/59  Pulse: 61 60 (!) 105 (!) 51  Resp: 18 (!) 28 (!) 24 (!) 30  Temp:    98.5 F (36.9 C)  TempSrc:    Oral  SpO2: 100% 100% 99% 98%  Weight:      Height:       General exam: Alert, awake, oriented x 3; no chest pain, no shortness of breath.  Complaining of left leg pain.  Still demonstrating A-fib with RVR. Respiratory system: Good saturation on room air; no using accessory muscle.  No rales or wheezing. Cardiovascular system: Irregularly irregular, no rubs, no gallops, no JVD on exam. Gastrointestinal system: Abdomen is nondistended, soft and nontender. No organomegaly or masses felt. Normal bowel sounds heard. Central nervous system: Alert and oriented. No focal neurological deficits. Extremities: No cyanosis or clubbing; trace edema appreciated bilaterally (left more than right); tender to palpation to his left lower extremity; positive warmth sensation. Skin: No rashes, no petechiae. Psychiatry: Judgement and insight appear normal. Mood & affect appropriate.    Family Communication: No family at bedside during today's examination; case discussed directly with patient and all questions answered.   Disposition: Status is: Inpatient  Remains inpatient appropriate because: IV heparin; transitioning to oral apixaban.   Planned Discharge Destination: Home  Author: Barton Dubois, MD 08/29/2022 2:27 PM  For on call review [email or web address].

## 2022-08-29 NOTE — Progress Notes (Signed)
ANTICOAGULATION CONSULT NOTE - follow up [facility] for heparin Indication: DVT  Allergies  Allergen Reactions   Codeine     Unknown reaction   Vancomycin Swelling    Patient Measurements: Height: 6\' 1"  (185.4 cm) Weight: 97.6 kg (215 lb 2.7 oz) IBW/kg (Calculated) : 79.9 Heparin Dosing Weight: 99.5 kg  Vital Signs: Temp: 98.8 F (37.1 C) (10/15 0750) Temp Source: Oral (10/15 0750) BP: 96/43 (10/15 0800) Pulse Rate: 61 (10/15 0800)  Labs: Recent Labs    08/27/22 1950 08/27/22 1950 08/28/22 0409 08/28/22 0759 08/28/22 1947 08/29/22 0346 08/29/22 0805  HGB 17.0  --  16.7  --   --  15.5  --   HCT 55.2*  --  53.9*  --   --  50.8  --   PLT 212  --  155  --   --  172  --   APTT  --   --  >200* >200* 65*  --   --   HEPARINUNFRC  --    < > >1.10* >1.10* <0.10*  --  0.39  CREATININE 13.96*  --  14.25*  --   --  11.47*  --    < > = values in this interval not displayed.     Estimated Creatinine Clearance: 9.1 mL/min (A) (by C-G formula based on SCr of 11.47 mg/dL (H)).   Medical History: Past Medical History:  Diagnosis Date   Atrial fibrillation (HCC)    CHF (congestive heart failure) (HCC)    Chronic kidney disease    Coronary artery disease    Dysrhythmia    A-fib   H/O heart artery stent    HLD (hyperlipidemia)    Hypertension    Kidney transplanted    Pneumonia    Sleep apnea    CPAP    Assessment: Patient is a 56 year old male admitted for L foot pain. He has a history of Afib but per admission report patient is not on anticoagulation PTA, patient has a history of a significant GI bleed in May 2022 where he required multiple PRBC transfusions. Per notes at that time, patient was on Eliquis, aspirin, and Effient but has not had any GI bleeding since.  Noted on dopplers today patient has a DVT in L leg. Baseline CBC wnl with hemoglobin 17, platelets  207. Prior to admission was not on eliquis. Note patient is on HD. Left lower extremity  ultrasound showed occlusive DVT within the left posterior tibial vein  (Note from 10/13 third shift pharmacist that level may of been drawn from restricted arm " HL >1.1 and PTT >200 but has restricted arm so RN aware to coordinate w/ lab to hold UFH x3min prior to draw and to draw from hand or as close to hand as possible since IV is in upper forearm" . )  HL>0.39,  therapeutic   Goal of Therapy:  Heparin level 0.3-0.7 units/ml Monitor platelets by anticoagulation protocol: Yes   Plan:  Continue heparin IV at 1200units/hr Recheck anti-Xa level  in 6 hours and daily while on heparin Continue to monitor H&H and platelets Aim for lower range of therapeutic levels d/t history of GI bleed  F/u transition to oral anticoagulation, consider Eliuqis again  Isac Sarna, BS Pharm D, BCPS Clinical Pharmacist 08/29/2022,9:36 AM

## 2022-08-29 NOTE — Progress Notes (Signed)
ANTICOAGULATION CONSULT NOTE - Follow Up Consult  Pharmacy Consult for heparin Indication: DVT  Labs: Recent Labs    08/27/22 1531 08/27/22 1950 08/28/22 0409 08/28/22 0759 08/28/22 1947  HGB 17.0 17.0 16.7  --   --   HCT 54.9* 55.2* 53.9*  --   --   PLT 207 212 155  --   --   APTT  --   --  >200* >200* 65*  HEPARINUNFRC  --   --  >1.10* >1.10* <0.10*  CREATININE 12.88* 13.96* 14.25*  --   --     Assessment: 56yo male subtherapeutic on heparin after rate change; no infusion issues or signs of bleeding per RN.  Goal of Therapy:  Heparin level 0.3-0.7 units/ml   Plan:  Will increase heparin infusion by 4 units/kg/hr to 1200 units/hr and check level in 8 hours.    Wynona Neat, PharmD, BCPS  08/29/2022,12:34 AM

## 2022-08-30 DIAGNOSIS — I82409 Acute embolism and thrombosis of unspecified deep veins of unspecified lower extremity: Secondary | ICD-10-CM

## 2022-08-30 HISTORY — DX: Acute embolism and thrombosis of unspecified deep veins of unspecified lower extremity: I82.409

## 2022-08-30 LAB — CBC
HCT: 48.2 % (ref 39.0–52.0)
Hemoglobin: 14.7 g/dL (ref 13.0–17.0)
MCH: 30.7 pg (ref 26.0–34.0)
MCHC: 30.5 g/dL (ref 30.0–36.0)
MCV: 100.6 fL — ABNORMAL HIGH (ref 80.0–100.0)
Platelets: 176 10*3/uL (ref 150–400)
RBC: 4.79 MIL/uL (ref 4.22–5.81)
RDW: 16.2 % — ABNORMAL HIGH (ref 11.5–15.5)
WBC: 13.4 10*3/uL — ABNORMAL HIGH (ref 4.0–10.5)
nRBC: 0 % (ref 0.0–0.2)

## 2022-08-30 LAB — GLUCOSE, CAPILLARY: Glucose-Capillary: 86 mg/dL (ref 70–99)

## 2022-08-30 MED ORDER — APIXABAN 5 MG PO TABS: ORAL_TABLET | ORAL | 3 refills | Status: DC

## 2022-08-30 MED ORDER — AMIODARONE HCL 200 MG PO TABS: ORAL_TABLET | ORAL | 0 refills | Status: DC

## 2022-08-30 MED ORDER — MIDODRINE HCL 5 MG PO TABS: 15.0000 mg | ORAL_TABLET | Freq: Three times a day (TID) | ORAL | 1 refills | Status: DC

## 2022-08-30 MED ORDER — AMIODARONE HCL 200 MG PO TABS
400.0000 mg | ORAL_TABLET | Freq: Two times a day (BID) | ORAL | Status: DC
  Administered 2022-08-30: 400 mg via ORAL
  Filled 2022-08-30: qty 2

## 2022-08-30 MED ORDER — OXYCODONE HCL 5 MG PO TABS: 5.0000 mg | ORAL_TABLET | Freq: Four times a day (QID) | ORAL | 0 refills | Status: DC | PRN

## 2022-08-30 MED ORDER — METOPROLOL TARTRATE 50 MG PO TABS: 50.0000 mg | ORAL_TABLET | Freq: Two times a day (BID) | ORAL | 1 refills | Status: DC

## 2022-08-30 NOTE — Progress Notes (Signed)
Nsg Discharge Note  Admit Date:  08/27/2022 Discharge date: 08/30/2022   [name] to be D/C'd Home per MD order.  AVS completed.  Copy for chart, and copy for patient signed, and dated. Patient/caregiver able to verbalize understanding.  Discharge Medication: Allergies as of 08/30/2022       Reactions   Codeine    Unknown reaction   Vancomycin Swelling        Medication List     TAKE these medications    acetaminophen 325 MG tablet Commonly known as: TYLENOL Take 2 tablets (650 mg total) by mouth every 6 (six) hours as needed for mild pain, fever or headache.   allopurinol 100 MG tablet Commonly known as: ZYLOPRIM Take 100 mg by mouth daily.   amiodarone 200 MG tablet Commonly known as: PACERONE Take 2 tablets by mouth twice a day for 6 days; then 1 tablet by mouth twice a day for 7 days; then 1 tablet by mouth daily.   apixaban 5 MG Tabs tablet Commonly known as: ELIQUIS Take 2 tablet by mouth twice a day x6 days; then 1 tablet by mouth twice a day.   aspirin EC 81 MG tablet Take 81 mg by mouth daily.   atorvastatin 80 MG tablet Commonly known as: LIPITOR Take 80 mg by mouth daily.   Auryxia 1 GM 210 MG(Fe) tablet Generic drug: ferric citrate Take 420 mg by mouth 3 (three) times daily. Take with each meal and snack   calcium carbonate 500 MG chewable tablet Commonly known as: TUMS - dosed in mg elemental calcium Chew 1 tablet by mouth 2 (two) times daily with a meal.   cyclobenzaprine 10 MG tablet Commonly known as: FLEXERIL Take 1 tablet by mouth 2 (two) times daily as needed.   metoprolol tartrate 50 MG tablet Commonly known as: LOPRESSOR Take 1 tablet (50 mg total) by mouth 2 (two) times daily.   midodrine 5 MG tablet Commonly known as: PROAMATINE Take 3 tablets (15 mg total) by mouth 3 (three) times daily with meals. What changed: how much to take   multivitamin with minerals tablet Take 1 tablet by mouth daily.   oxyCODONE 5 MG  immediate release tablet Commonly known as: Oxy IR/ROXICODONE Take 1 tablet (5 mg total) by mouth every 6 (six) hours as needed for severe pain.   pantoprazole 40 MG tablet Commonly known as: PROTONIX Take 1 tablet (40 mg total) by mouth 2 (two) times daily. What changed: when to take this   predniSONE 10 MG tablet Commonly known as: DELTASONE Take 10 mg by mouth daily with breakfast.   tacrolimus 0.5 MG capsule Commonly known as: PROGRAF Take 0.5 mg by mouth daily.   tamsulosin 0.4 MG Caps capsule Commonly known as: FLOMAX Take 0.4 mg by mouth daily.   VITAMIN C PO Take 1 tablet by mouth daily.   Vitamin D 50 MCG (2000 UT) tablet Take 2,000 Units by mouth daily.        Discharge Assessment: Vitals:   08/30/22 1415 08/30/22 1458  BP: 120/80 105/80  Pulse:  68  Resp: 16 18  Temp:  98.2 F (36.8 C)  SpO2:  100%   Skin clean, dry and intact without evidence of skin break down, no evidence of skin tears noted. IV catheter discontinued intact. Site without signs and symptoms of complications - no redness or edema noted at insertion site, patient denies c/o pain - only slight tenderness at site.  Dressing with slight pressure applied.  D/c Instructions-Education: Discharge instructions given to patient/family with verbalized understanding. D/c education completed with patient/family including follow up instructions, medication list, d/c activities limitations if indicated, with other d/c instructions as indicated by MD - patient able to verbalize understanding, all questions fully answered. Patient instructed to return to ED, call 911, or call MD for any changes in condition.  Patient escorted via [place], and D/C home via private auto.  Carney Corners, RN 08/30/2022 3:19 PM

## 2022-08-30 NOTE — Discharge Summary (Signed)
Physician Discharge Summary   Patient: [name] MRN: [id number] DOB: June 23, 1966  Admit date:     08/27/2022  Discharge date: 08/30/22  Discharge Physician: [name]   PCP: [name] Rhyme, MD   Recommendations at discharge:  Repeat CBC to follow hemoglobin and platelet count stability Repeat basic metabolic panel to follow electrolytes stability. Make sure patient has follow-up with nephrology and cardiology as instructed. Check magnesium level to assess stability (goal is for magnesium above 2).  Discharge Diagnoses: Principal Problem:   Acute deep vein thrombosis (DVT) of distal vein of left lower extremity (HCC) Active Problems:   ESRD on dialysis [facility])   Atrial fibrillation with RVR (HCC)   Chronic diastolic CHF (congestive heart failure) (HCC)   GERD (gastroesophageal reflux disease)   Mixed hyperlipidemia   OSA on CPAP  Hospital Course: [name] was admitted to the hospital with the working diagnosis of acute left lower extremity DVT complicated with hyperkalemia.   56 yo male with the past medical history of ERSD, hypertension, atrial fibrillation, sp Watchman device (2022) who presented with left foot pain. He was not able to complete his HD treatment due to shivering, he was referred to the ED for further evaluation. On his initial physical examination his RR 18, BP 96/63, HR 52 and 02 saturation 92%, lungs with no wheezing or rales, heart with S1 and S2 present and rhythmic, abdomen with no distention, no lower extremity edema, left foot was tender to palpation at the dorsum.   Na 137, K 5,7 Cl 97, bicarbonate at 24, glucose 86 bun 48 cr 12.8 Wbc 13,4 hgb 17,0 plt 207  Sars covid 19 negative   Chest radiograph with mild cardiomegaly, bilateral atelectasis at bases with loculated right pleural effusion.  EKG 79 bpm, right axis, qtc 501, atrial fibrillation rhythm with no significant ST segment changes, negative T wave V1 and V2.   Left lower extremity US  with occlusive DVT within a left posterior tibial vein.   Vascular surgery was consulted over the phone with recommendations to continue anticoagulation with IV heparin, no invasive vascular intervention indicated.  Nephrology was consulted for renal replacement therapy.     Assessment and Plan: * Acute deep vein thrombosis (DVT) of distal vein of left lower extremity (HCC) -Left lower extremity with occlusive deep vein thrombosis and complains of left posterior tibial pain.  -Patient was placed on heparin with good tolerance, and no signs of overt bleeding. -At this moment after 24-36 hours of IV anticoagulation successfully transitioned to oral apixaban. -Planning to continue the use of oxycodone for severe pain and continue with acetaminophen for mild pain.   ESRD on dialysis [facility]) -Presented with hyperkalemia. -Treated with Lokelma and hemodialysis therapy. -Electrolytes are stabilizing within normal limits at discharge. -Continue outpatient dialysis management, follow recommendation by nephrology service. -Patient will resume the use of his steroids and Prograf; and nephrology service will decide regarding IV iron and Epogen therapy. -Patient last hemodialysis 08/30/2022 prior to discharge; continue outpatient treatment with next anticipated dialysis on 09/01/2022 (patient on Monday-Wednesday-Friday schedule in the outpatient setting). -Continue the use of midodrine (dose has been adjusted).    Atrial fibrillation with RVR ([place]) -Patient experience difficult to control A-fib with RVR along with ongoing hypotension. -Metoprolol dose adjusted, patient is started on amiodarone with excellent response. -Continue Eliquis for secondary prevention. -At discharge will follow amiodarone tapering and outpatient follow-up with his cardiology service. -Heart rate stable and in the 80s at discharge.   Chronic diastolic  CHF (congestive heart failure) (HCC) -Echocardiogram from 2021 ([name]),  with preserved LV systolic function Ef 60 to 19%, mild RV systolic function reduction. Cardiac catheterization with PA mean 33.  -Patient with component of pulmonary hypertension class II,  -Unable to receive treatment with sildenafil or tadalafil due to hypotension.  -Currently compensated and is stable. -Continue to follow hemodialysis for volume management. -Continue low-sodium diet and the use of midodrine to assist maintaining blood pressure -Patient has been started on metoprolol for A-fib.  GERD (gastroesophageal reflux disease) -Continue antiacid therapy with pantoprazole -Close outpatient follow up of his hemoglobin level/trend.   -Patient had history of upper GI bleed with cardiogenic shock in 2022, EGD with bleeding into the duodenum suspected AVM, treated with hemostatic spray. He required 9 units PRBC transfusion.  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    Mixed hyperlipidemia -Continue with statin therapy.  -Patient encouraged to follow heart healthy diet.  OSA on CPAP -Continue with Cpap at night.    Consultants: Nephrology service; cardiology curbside. Procedures performed: See below for x-ray reports. Disposition: Home. Diet recommendation: Heart healthy/low-sodium diet.  DISCHARGE MEDICATION: Allergies as of 08/30/2022       Reactions   Codeine    Unknown reaction   Vancomycin Swelling        Medication List     TAKE these medications    acetaminophen 325 MG tablet Commonly known as: TYLENOL Take 2 tablets (650 mg total) by mouth every 6 (six) hours as needed for mild pain, fever or headache.   allopurinol 100 MG tablet Commonly known as: ZYLOPRIM Take 100 mg by mouth daily.   amiodarone 200 MG tablet Commonly known as: PACERONE Take 2 tablets by mouth twice a day for 6 days;  then 1 tablet by mouth twice a day for 7 days; then 1 tablet by mouth daily.   apixaban 5 MG Tabs tablet Commonly known as: ELIQUIS Take 2 tablet by mouth twice a day x6 days; then 1 tablet by mouth twice a day.   aspirin EC 81 MG tablet Take 81 mg by mouth daily.   atorvastatin 80 MG tablet Commonly known as: LIPITOR Take 80 mg by mouth daily.   Auryxia 1 GM 210 MG(Fe) tablet Generic drug: ferric citrate Take 420 mg by mouth 3 (three) times daily. Take with each meal and snack   calcium carbonate 500 MG chewable tablet Commonly known as: TUMS - dosed in mg elemental calcium Chew 1 tablet by mouth 2 (two) times daily with a meal.   cyclobenzaprine 10 MG tablet Commonly known as: FLEXERIL Take 1 tablet by mouth 2 (two) times daily as needed.   metoprolol tartrate 50 MG tablet Commonly known as: LOPRESSOR Take 1 tablet (50 mg total) by mouth 2 (two) times daily.   midodrine 5 MG tablet Commonly known as: PROAMATINE Take 3 tablets (15 mg total) by mouth 3 (three) times daily with meals. What changed: how much to take   multivitamin with minerals tablet Take 1 tablet by mouth daily.   oxyCODONE 5 MG immediate release tablet Commonly known as: Oxy IR/ROXICODONE Take 1 tablet (5 mg total) by mouth every 6 (six) hours as needed for severe pain.   pantoprazole 40 MG tablet Commonly known as: PROTONIX Take 1 tablet (40 mg total) by mouth 2 (two) times daily. What changed:  when to take this   predniSONE 10 MG tablet Commonly known as: DELTASONE Take 10 mg by mouth daily with breakfast.   tacrolimus 0.5 MG capsule Commonly known as: PROGRAF Take 0.5 mg by mouth daily.   tamsulosin 0.4 MG Caps capsule Commonly known as: FLOMAX Take 0.4 mg by mouth daily.   VITAMIN C PO Take 1 tablet by mouth daily.   Vitamin D 50 MCG (2000 UT) tablet Take 2,000 Units by mouth daily.        Follow-up Information     [name] Rhyme, MD. Schedule an appointment as soon as  possible for a visit in 10 day(s).   Specialty: Family Medicine Contact information: [address] [place] [address] [phone]                Discharge Exam: Danley Danker Weights   08/30/22 0441 08/30/22 1000 08/30/22 1045  Weight: 101.4 kg 99.1 kg 99.1 kg   General exam: Alert, awake, oriented x 3; reporting no chest pain, no palpitations, no shortness of breath.  Still having some left leg pain. Respiratory system: Clear to auscultation. Respiratory effort normal. Cardiovascular system: Rate controlled, no rubs, no gallops, no JVD. Gastrointestinal system: Abdomen is nondistended, soft and nontender. No organomegaly or masses felt. Normal bowel sounds heard. Central nervous system: Alert and oriented. No focal neurological deficits. Extremities: No cyanosis or clubbing; trace edema appreciated bilaterally (left more than right); left leg tender to palpation distally. Skin: No petechiae. Psychiatry: Judgement and insight appear normal. Mood & affect appropriate.    Condition at discharge: Stable and improved.  The results of significant diagnostics from this hospitalization (including imaging, microbiology, ancillary and laboratory) are listed below for reference.   Imaging Studies: US Venous Img Lower Unilateral Left  Result Date: 08/27/2022 CLINICAL DATA:  pain EXAM: LEFT LOWER EXTREMITY VENOUS DOPPLER ULTRASOUND TECHNIQUE: Gray-scale sonography with graded compression, as well as color Doppler and duplex ultrasound were performed to evaluate the lower extremity deep venous systems from the level of the common femoral vein and including the common femoral, femoral, profunda femoral, popliteal and calf veins including the posterior tibial, peroneal and gastrocnemius veins when visible. The superficial great saphenous vein was also interrogated. Spectral Doppler was utilized to evaluate flow at rest and with distal augmentation maneuvers in the common femoral, femoral and  popliteal veins. COMPARISON:  None Available. FINDINGS: Contralateral Common Femoral Vein: Respiratory phasicity is normal and symmetric with the symptomatic side. No evidence of thrombus. Normal compressibility. Common Femoral Vein: No evidence of thrombus. Normal compressibility, respiratory phasicity and response to augmentation. Saphenofemoral Junction: No evidence of thrombus. Normal compressibility and flow on color Doppler imaging. Profunda Femoral Vein: No evidence of thrombus. Normal compressibility and flow on color Doppler imaging. Femoral Vein: No evidence of thrombus. Normal compressibility, respiratory phasicity and response to augmentation. Popliteal Vein: No evidence of thrombus. Normal compressibility, respiratory phasicity and response to augmentation. Calf Veins: Duplicated posterior tibial vein with occlusive and noncompressible thrombus within 1 of the veins. Peroneal vein appears patent. Superficial Great Saphenous Vein: No evidence of thrombus. Normal compressibility. IMPRESSION: Occlusive DVT within a left posterior tibial vein. Electronically Signed   By: Margaretha Sheffield M.D.   On: 08/27/2022 15:34   DG Chest Port 1 View  Result Date: 08/27/2022 CLINICAL DATA:  Fever EXAM: PORTABLE CHEST 1 VIEW COMPARISON:  04/04/2021, 03/31/2021, 11/15/2016 FINDINGS: Chronic pleural and parenchymal scarring on the right. Chronic elevation left diaphragm. Atelectasis versus small pneumonia left base. Multiple metallic  densities over the right chest and upper extremity. Partially visualized plate and screws in the right humerus. No acute airspace disease or effusion. Stable cardiomediastinal silhouette. IMPRESSION: Atelectasis versus small pneumonia left lung base. Stable pleural and parenchymal scarring on the right. Electronically Signed   By: Donavan Foil M.D.   On: 08/27/2022 15:19   DG Foot Complete Left  Result Date: 08/27/2022 CLINICAL DATA:  Left foot swelling since yesterday. Left toes  are cramping. EXAM: LEFT FOOT - COMPLETE 3+ VIEW COMPARISON:  None Available. FINDINGS: Moderate plantar calcaneal heel spur. Mild dorsal tarsometatarsal degenerative spurring. No acute fracture or dislocation. Moderate to high-grade vascular calcifications. IMPRESSION: 1. Moderate plantar calcaneal heel spur. 2. No acute fracture. Electronically Signed   By: Yvonne Kendall M.D.   On: 08/27/2022 14:31    Microbiology: Results for orders placed or performed during the hospital encounter of 08/27/22  SARS Coronavirus 2 by RT PCR (hospital order, performed in [facility] hospital lab) *cepheid single result test* Anterior Nasal Swab     Status: None   Collection Time: 08/27/22  2:57 PM   Specimen: Anterior Nasal Swab  Result Value Ref Range Status   SARS Coronavirus 2 by RT PCR NEGATIVE NEGATIVE Final    Comment: (NOTE) SARS-CoV-2 target nucleic acids are NOT DETECTED.  The SARS-CoV-2 RNA is generally detectable in upper and lower respiratory specimens during the acute phase of infection. The lowest concentration of SARS-CoV-2 viral copies this assay can detect is 250 copies / mL. A negative result does not preclude SARS-CoV-2 infection and should not be used as the sole basis for treatment or other patient management decisions.  A negative result may occur with improper specimen collection / handling, submission of specimen other than nasopharyngeal swab, presence of viral mutation(s) within the areas targeted by this assay, and inadequate number of viral copies (<250 copies / mL). A negative result must be combined with clinical observations, patient history, and epidemiological information.  Fact Sheet for Patients:   https://www.patel.info/  Fact Sheet for Healthcare Providers: https://hall.com/  This test is not yet approved or  cleared by the [place] FDA and has been authorized for detection and/or diagnosis of SARS-CoV-2 by FDA under  an Emergency Use Authorization (EUA).  This EUA will remain in effect (meaning this test can be used) for the duration of the COVID-19 declaration under Section 564(b)(1) of the Act, 21 U.S.C. section 360bbb-3(b)(1), unless the authorization is terminated or revoked sooner.  Performed at [facility], [address] [place], [place] [address]   Blood culture (routine x 2)     Status: None (Preliminary result)   Collection Time: 08/27/22  4:56 PM   Specimen: BLOOD  Result Value Ref Range Status   Specimen Description BLOOD  Final   Special Requests Immunocompromised  Final   Culture   Final    NO GROWTH 3 DAYS Performed at [facility], [address], [place], [place] [address]    Report Status PENDING  Incomplete  Culture, blood (Routine X 2) w Reflex to ID Panel     Status: None (Preliminary result)   Collection Time: 08/27/22  7:50 PM   Specimen: BLOOD RIGHT HAND  Result Value Ref Range Status   Specimen Description BLOOD RIGHT HAND BOTTLES DRAWN AEROBIC ONLY  Final   Special Requests Blood Culture adequate volume  Final   Culture   Final    NO GROWTH 3 DAYS Performed at [facility], [address] [place], [place] [address]  Report Status PENDING  Incomplete  MRSA Next Gen by PCR, Nasal     Status: None   Collection Time: 08/27/22 10:19 PM   Specimen: Nasal Mucosa; Nasal Swab  Result Value Ref Range Status   MRSA by PCR Next Gen NOT DETECTED NOT DETECTED Final    Comment: (NOTE) The GeneXpert MRSA Assay (FDA approved for NASAL specimens only), is one component of a comprehensive MRSA colonization surveillance program. It is not intended to diagnose MRSA infection nor to guide or monitor treatment for MRSA infections. Test performance is not FDA approved in patients less than 59 years old. Performed at [facility], [address], [place], [place] [address]     Labs: CBC: Recent Labs  Lab 08/27/22 1531 08/27/22 1950 08/28/22 0409 08/29/22 0346 08/30/22 0428   WBC 13.4* 16.1* 13.3* 12.9* 13.4*  NEUTROABS 10.9*  --   --   --   --   HGB 17.0 17.0 16.7 15.5 14.7  HCT 54.9* 55.2* 53.9* 50.8 48.2  MCV 98.7 100.0 100.0 101.2* 100.6*  PLT 207 212 155 172 562   Basic Metabolic Panel: Recent Labs  Lab 08/27/22 1531 08/27/22 1950 08/27/22 2135 08/28/22 0037 08/28/22 0409 08/28/22 0759 08/29/22 0346  NA 137 136  --   --  136  --  137  K 5.7* 6.6* 6.4* 4.8 5.6* 7.0* 4.0  CL 97* 96*  --   --  95*  --  95*  CO2 24 23  --   --  23  --  28  GLUCOSE 86 79  --   --  74  --  111*  BUN 48* 53*  --   --  57*  --  44*  CREATININE 12.88* 13.96*  --   --  14.25*  --  11.47*  CALCIUM 8.3* 8.1*  --   --  7.9*  --  7.3*  MG  --   --   --   --  2.5*  --   --   PHOS  --  3.0  --   --  5.1*  --   --    Liver Function Tests: Recent Labs  Lab 08/27/22 1531 08/27/22 1950 08/28/22 0409  AST 31  --  28  ALT 32  --  33  ALKPHOS 132*  --  134*  BILITOT 1.1  --  1.1  PROT 7.2  --  7.3  ALBUMIN 3.7 3.8 3.5   CBG: Recent Labs  Lab 08/27/22 2228 08/28/22 0040 08/28/22 0108 08/28/22 0456 08/28/22 1206  GLUCAP 86 55* 115* 79 118*    Discharge time spent: greater than 30 minutes.  Signed: Barton Dubois, MD [facility] 08/30/2022

## 2022-08-30 NOTE — Progress Notes (Signed)
Admit: 08/27/2022 LOS: 3  42M ESRD MWF [facility]  Subjective:  No c/o Now on apixaban For HD today   10/15 0701 - 10/16 0700 In: 835.8 [P.O.:600; I.V.:235.8] Out: 0   Filed Weights   08/28/22 1430 08/30/22 0441 08/30/22 1000  Weight: 97.6 kg 101.4 kg 99.1 kg    Scheduled Meds:  amiodarone  400 mg Oral BID   apixaban  10 mg Oral BID   Followed by   Derrill Memo ON 09/05/2022] apixaban  5 mg Oral BID   atorvastatin  80 mg Oral Daily   calcium carbonate  1 tablet Oral BID WC   Chlorhexidine Gluconate Cloth  6 each Topical Q0600   cholecalciferol  2,000 Units Oral Daily   ferric citrate  420 mg Oral TID WC   metoprolol tartrate  50 mg Oral BID   midodrine  15 mg Oral TID WC   multivitamin with minerals  1 tablet Oral Daily   pantoprazole  40 mg Oral BID   predniSONE  10 mg Oral Q breakfast   tamsulosin  0.4 mg Oral Daily   Continuous Infusions:  anticoagulant sodium citrate     PRN Meds:.acetaminophen, alteplase, anticoagulant sodium citrate, heparin, lidocaine (PF), lidocaine-prilocaine, oxyCODONE, pentafluoroprop-tetrafluoroeth  Current Labs: reviewed   Physical Exam:  Blood pressure 96/70, pulse (!) 106, temperature 98.1 F (36.7 C), temperature source Oral, resp. rate 20, height 6\' 1"  (1.854 m), weight 99.1 kg, SpO2 100 %. NAD, conversant IRIR normal rate CTAB Sn/nt/nd LUE AVF +B/T LLE still tender  Dialysis Orders:  Center:  [place] [phone] MWF   3-1/2 hours EDW 98. HD [place] 2/2.5, Dialyzer 180, Heparin no. Access left aVF.  400 blood flow with 500 DFR-temperature 36.5 Epogen 1400 3 times weekly, Venofer 100 weekly.  Sodium thiosulfate 25 g 3 times a week   A ESRD MWF on schedule today AVF LLE DVT now on apixaban, stable, for outpt mgmt Hyperkalemia: resolved Anemia, not an issue now CKD-BMM, some hypocalcemia, P at goal.  CTM.  Outpt mgmt cont tums HTN/Vol: BP soft,  euvolemic; ASx; UF 1L today P HD today OK for DC post HD, no inpatient  renal issues Medication Issues; Preferred narcotic agents for pain control are hydromorphone, fentanyl, and methadone. Morphine should not be used.  Baclofen should be avoided Avoid oral sodium phosphate and magnesium citrate based laxatives / bowel preps    Pearson Grippe MD 08/30/2022, 10:30 AM  Recent Labs  Lab 08/27/22 1950 08/27/22 2135 08/28/22 0409 08/28/22 0759 08/29/22 0346  NA 136  --  136  --  137  K 6.6*   < > 5.6* 7.0* 4.0  CL 96*  --  95*  --  95*  CO2 23  --  23  --  28  GLUCOSE 79  --  74  --  111*  BUN 53*  --  57*  --  44*  CREATININE 13.96*  --  14.25*  --  11.47*  CALCIUM 8.1*  --  7.9*  --  7.3*  PHOS 3.0  --  5.1*  --   --    < > = values in this interval not displayed.   Recent Labs  Lab 08/27/22 1531 08/27/22 1950 08/28/22 0409 08/29/22 0346 08/30/22 0428  WBC 13.4*   < > 13.3* 12.9* 13.4*  NEUTROABS 10.9*  --   --   --   --   HGB 17.0   < > 16.7 15.5 14.7  HCT 54.9*   < >  53.9* 50.8 48.2  MCV 98.7   < > 100.0 101.2* 100.6*  PLT 207   < > 155 172 176   < > = values in this interval not displayed.

## 2022-08-30 NOTE — Procedures (Signed)
   HEMODIALYSIS TREATMENT NOTE:   Uneventful 3.5 hour heparin-free treatment completed using left forearm AVF (15g/antegrade). Goal met: 1.4 liters removed without interruption in UF.  Midodrine 15 mg given 1.5 hours into treatment; hemodynamically stable throughout session. All blood was returned and hemostasis was achieved in 15 minutes.  Hand-off given to [place], RN.   Rockwell Alexandria, RN

## 2022-08-30 NOTE — Care Management Important Message (Signed)
Important Message  Patient Details  Name: [name] MRN: [id number] Date of Birth: September 17, 1966   Medicare Important Message Given:  Yes     Tommy Medal 08/30/2022, 2:58 PM

## 2022-09-01 LAB — CULTURE, BLOOD (ROUTINE X 2)
Culture: NO GROWTH
Culture: NO GROWTH
Special Requests: ADEQUATE

## 2022-09-04 ENCOUNTER — Encounter (HOSPITAL_COMMUNITY): Payer: Self-pay

## 2022-09-04 ENCOUNTER — Emergency Department (HOSPITAL_COMMUNITY)
Admission: EM | Admit: 2022-09-04 | Discharge: 2022-09-04 | Disposition: A | Discharge status: Home / Self Care | Payer: Medicare Other | Attending: Emergency Medicine | Admitting: Emergency Medicine

## 2022-09-04 ENCOUNTER — Emergency Department (HOSPITAL_COMMUNITY): Payer: Medicare Other

## 2022-09-04 ENCOUNTER — Other Ambulatory Visit: Payer: Self-pay

## 2022-09-04 DIAGNOSIS — R000 Tachycardia, unspecified: Secondary | ICD-10-CM | POA: Diagnosis not present

## 2022-09-04 DIAGNOSIS — Z7901 Long term (current) use of anticoagulants: Secondary | ICD-10-CM | POA: Diagnosis not present

## 2022-09-04 DIAGNOSIS — I82442 Acute embolism and thrombosis of left tibial vein: Secondary | ICD-10-CM | POA: Insufficient documentation

## 2022-09-04 DIAGNOSIS — Z7982 Long term (current) use of aspirin: Secondary | ICD-10-CM | POA: Diagnosis not present

## 2022-09-04 DIAGNOSIS — M79605 Pain in left leg: Secondary | ICD-10-CM

## 2022-09-04 LAB — CBC WITH DIFFERENTIAL/PLATELET
Abs Immature Granulocytes: 0.07 10*3/uL (ref 0.00–0.07)
Basophils Absolute: 0.1 10*3/uL (ref 0.0–0.1)
Basophils Relative: 1 %
Eosinophils Absolute: 0.3 10*3/uL (ref 0.0–0.5)
Eosinophils Relative: 2 %
HCT: 46.3 % (ref 39.0–52.0)
Hemoglobin: 14.4 g/dL (ref 13.0–17.0)
Immature Granulocytes: 1 %
Lymphocytes Relative: 7 %
Lymphs Abs: 0.9 10*3/uL (ref 0.7–4.0)
MCH: 30.4 pg (ref 26.0–34.0)
MCHC: 31.1 g/dL (ref 30.0–36.0)
MCV: 97.9 fL (ref 80.0–100.0)
Monocytes Absolute: 1.5 10*3/uL — ABNORMAL HIGH (ref 0.1–1.0)
Monocytes Relative: 12 %
Neutro Abs: 9.8 10*3/uL — ABNORMAL HIGH (ref 1.7–7.7)
Neutrophils Relative %: 77 %
Platelets: 294 10*3/uL (ref 150–400)
RBC: 4.73 MIL/uL (ref 4.22–5.81)
RDW: 16.5 % — ABNORMAL HIGH (ref 11.5–15.5)
WBC: 12.6 10*3/uL — ABNORMAL HIGH (ref 4.0–10.5)
nRBC: 0 % (ref 0.0–0.2)

## 2022-09-04 LAB — COMPREHENSIVE METABOLIC PANEL
ALT: 44 U/L (ref 0–44)
AST: 56 U/L — ABNORMAL HIGH (ref 15–41)
Albumin: 2.9 g/dL — ABNORMAL LOW (ref 3.5–5.0)
Alkaline Phosphatase: 248 U/L — ABNORMAL HIGH (ref 38–126)
Anion gap: 17 — ABNORMAL HIGH (ref 5–15)
BUN: 55 mg/dL — ABNORMAL HIGH (ref 6–20)
CO2: 26 mmol/L (ref 22–32)
Calcium: 7.4 mg/dL — ABNORMAL LOW (ref 8.9–10.3)
Chloride: 94 mmol/L — ABNORMAL LOW (ref 98–111)
Creatinine, Ser: 11.98 mg/dL — ABNORMAL HIGH (ref 0.61–1.24)
GFR, Estimated: 5 mL/min — ABNORMAL LOW (ref >60)
Glucose, Bld: 106 mg/dL — ABNORMAL HIGH (ref 70–99)
Potassium: 4.6 mmol/L (ref 3.5–5.1)
Sodium: 137 mmol/L (ref 135–145)
Total Bilirubin: 1.1 mg/dL (ref 0.3–1.2)
Total Protein: 7.3 g/dL (ref 6.5–8.1)

## 2022-09-04 LAB — LACTIC ACID, PLASMA: Lactic Acid, Venous: 2 mmol/L (ref 0.5–1.9)

## 2022-09-04 LAB — CBG MONITORING, ED: Glucose-Capillary: 103 mg/dL — ABNORMAL HIGH (ref 70–99)

## 2022-09-04 MED ORDER — KETOROLAC TROMETHAMINE 15 MG/ML IJ SOLN
15.0000 mg | Freq: Once | INTRAMUSCULAR | Status: AC
  Administered 2022-09-04: 15 mg via INTRAVENOUS
  Filled 2022-09-04: qty 1

## 2022-09-04 MED ORDER — DOXYCYCLINE HYCLATE 100 MG PO CAPS: 100.0000 mg | ORAL_CAPSULE | Freq: Two times a day (BID) | ORAL | 0 refills | Status: DC

## 2022-09-04 MED ORDER — MIDODRINE HCL 5 MG PO TABS
15.0000 mg | ORAL_TABLET | Freq: Once | ORAL | Status: AC
  Administered 2022-09-04: 15 mg via ORAL
  Filled 2022-09-04: qty 3

## 2022-09-04 MED ORDER — FENTANYL CITRATE PF 50 MCG/ML IJ SOSY: 50.0000 ug | PREFILLED_SYRINGE | Freq: Once | INTRAMUSCULAR | Status: DC

## 2022-09-04 MED ORDER — SODIUM CHLORIDE 0.9 % IV BOLUS
500.0000 mL | Freq: Once | INTRAVENOUS | Status: AC
  Administered 2022-09-04: 500 mL via INTRAVENOUS

## 2022-09-04 MED ORDER — SODIUM CHLORIDE 0.9 % IV SOLN
2.0000 g | Freq: Once | INTRAVENOUS | Status: AC
  Administered 2022-09-04: 2 g via INTRAVENOUS
  Filled 2022-09-04: qty 20

## 2022-09-04 NOTE — ED Provider Notes (Cosign Needed Addendum)
[facility] EMERGENCY DEPARTMENT Provider Note   CSN: [id number] Arrival date & time: 09/04/22  7858     History  Chief Complaint  Patient presents with   Leg Pain    [name] is a 56 y.o. male.  The history is provided by the patient, the spouse and medical records. No language interpreter was used.  Leg Pain    56 year old male presenting complaining of left leg pain.  Patient recently diagnosed with having a DVT to left lower extremity on 08/27/2022.  He was hospitalized on the same date for his presentation and was discharged on 08/30/2022.  He went home with oral apixaban.  He was given oxycodone for pain control.  Patient also is a dialysis patient on Monday Wednesday and Friday schedule.  Patient mention since being discharged he has been experiencing progressive worsening pain about his left lower extremity.  Yesterday he also noticed a blister that appears which concerns him.  He endorsed chills.  He worries for infection.  He is taking Tylenol at home without adequate relief.  He has been compliant with taking his Eliquis.  He did not take his midodrine medication this morning.  He was prescribed Percocet but states he did not take it because it does not provide any relief.  No fever chest pain productive cough.  Home Medications Prior to Admission medications   Medication Sig Start Date End Date Taking? Authorizing Provider  acetaminophen (TYLENOL) 325 MG tablet Take 2 tablets (650 mg total) by mouth every 6 (six) hours as needed for mild pain, fever or headache. 04/07/21   Elgergawy, Silver Huguenin, MD  allopurinol (ZYLOPRIM) 100 MG tablet Take 100 mg by mouth daily. 05/22/21   [provider]  amiodarone (PACERONE) 200 MG tablet Take 2 tablets by mouth twice a day for 6 days; then 1 tablet by mouth twice a day for 7 days; then 1 tablet by mouth daily. 08/30/22   Barton Dubois, MD  apixaban (ELIQUIS) 5 MG TABS tablet Take 2 tablet by mouth twice a day x6 days; then 1  tablet by mouth twice a day. 08/30/22   Barton Dubois, MD  Ascorbic Acid (VITAMIN C PO) Take 1 tablet by mouth daily.    [provider]  aspirin 81 MG EC tablet Take 81 mg by mouth daily.    [provider]  atorvastatin (LIPITOR) 80 MG tablet Take 80 mg by mouth daily.    [provider]  AURYXIA 1 GM 210 MG(Fe) tablet Take 420 mg by mouth 3 (three) times daily. Take with each meal and snack 08/16/22   [provider]  calcium carbonate (TUMS - DOSED IN MG ELEMENTAL CALCIUM) 500 MG chewable tablet Chew 1 tablet by mouth 2 (two) times daily with a meal.    [provider]  Cholecalciferol (VITAMIN D) 50 MCG (2000 UT) tablet Take 2,000 Units by mouth daily.    [provider]  cyclobenzaprine (FLEXERIL) 10 MG tablet Take 1 tablet by mouth 2 (two) times daily as needed. 06/11/21   [provider]  metoprolol tartrate (LOPRESSOR) 50 MG tablet Take 1 tablet (50 mg total) by mouth 2 (two) times daily. 08/30/22   Barton Dubois, MD  midodrine (PROAMATINE) 5 MG tablet Take 3 tablets (15 mg total) by mouth 3 (three) times daily with meals. 08/30/22   Barton Dubois, MD  Multiple Vitamins-Minerals (MULTIVITAMIN WITH MINERALS) tablet Take 1 tablet by mouth daily.    [provider]  oxyCODONE (OXY IR/ROXICODONE)  5 MG immediate release tablet Take 1 tablet (5 mg total) by mouth every 6 (six) hours as needed for severe pain. 08/30/22   Barton Dubois, MD  pantoprazole (PROTONIX) 40 MG tablet Take 1 tablet (40 mg total) by mouth 2 (two) times daily. Patient taking differently: Take 40 mg by mouth daily. 04/07/21   Elgergawy, Silver Huguenin, MD  predniSONE (DELTASONE) 10 MG tablet Take 10 mg by mouth daily with breakfast.    [provider]  tacrolimus (PROGRAF) 0.5 MG capsule Take 0.5 mg by mouth daily.    [provider]  tamsulosin (FLOMAX) 0.4 MG CAPS capsule Take 0.4 mg by mouth daily.     [provider]       Allergies    Codeine and Vancomycin    Review of Systems   Review of Systems  All other systems reviewed and are negative.   Physical Exam Updated Vital Signs BP (!) 114/94   Pulse 60   Temp 98.1 F (36.7 C) (Oral)   Resp 17   Ht 6\' 1"  (1.854 m)   Wt 98 kg   SpO2 90%   BMI 28.50 kg/m  Physical Exam Vitals and nursing note reviewed.  Constitutional:      General: He is not in acute distress.    Appearance: He is well-developed.  HENT:     Head: Atraumatic.  Eyes:     Conjunctiva/sclera: Conjunctivae normal.  Cardiovascular:     Rate and Rhythm: Tachycardia present.  Pulmonary:     Effort: Pulmonary effort is normal.     Breath sounds: Normal breath sounds.  Abdominal:     Palpations: Abdomen is soft.     Tenderness: There is no abdominal tenderness.  Musculoskeletal:     Cervical back: Neck supple.  Skin:    Findings: No rash.     Comments: Left lower extremity: Moderate erythema and warmth appreciated noted to the dorsum of the leg from his mid shin extending towards the dorsum of the foot.  A cluster of blisters noted to the dorsum of the foot.  Area exquisitely tender to palpation, intact dorsalis pedis pulse noted.  Neurological:     Mental Status: He is alert. Mental status is at baseline.     ED Results / Procedures / Treatments   Labs (all labs ordered are listed, but only abnormal results are displayed) Labs Reviewed  LACTIC ACID, PLASMA - Abnormal; Notable for the following components:      Result Value   Lactic Acid, Venous 2.0 (*)    All other components within normal limits  COMPREHENSIVE METABOLIC PANEL - Abnormal; Notable for the following components:   Chloride 94 (*)    Glucose, Bld 106 (*)    BUN 55 (*)    Creatinine, Ser 11.98 (*)    Calcium 7.4 (*)    Albumin 2.9 (*)    AST 56 (*)    Alkaline Phosphatase 248 (*)    GFR, Estimated 5 (*)    Anion gap 17 (*)    All other components within normal limits  CBC WITH  DIFFERENTIAL/PLATELET - Abnormal; Notable for the following components:   WBC 12.6 (*)    RDW 16.5 (*)    Neutro Abs 9.8 (*)    Monocytes Absolute 1.5 (*)    All other components within normal limits  CBG MONITORING, ED - Abnormal; Notable for the following components:   Glucose-Capillary 103 (*)    All other components within normal limits  EKG None  Radiology DG Chest Portable 1 View  Result Date: 09/04/2022 CLINICAL DATA:  Hypoxia EXAM: PORTABLE CHEST 1 VIEW COMPARISON:  08/27/2022 FINDINGS: Stable cardiomegaly. Low lung volumes. Stable small right pleural effusion and/or pleural thickening. Chronic scarring/atelectasis at the right lung base. No superimposed airspace opacity. No pneumothorax. Numerous small metallic densities project over the right chest. IMPRESSION: Low lung volumes with chronic scarring/atelectasis at the right lung base. No superimposed airspace opacity. Electronically Signed   By: Davina Poke D.O.   On: 09/04/2022 11:17    Procedures Procedures    Medications Ordered in ED Medications  midodrine (PROAMATINE) tablet 15 mg (15 mg Oral Given 09/04/22 1132)  ketorolac (TORADOL) 15 MG/ML injection 15 mg (15 mg Intravenous Given 09/04/22 1133)  sodium chloride 0.9 % bolus 500 mL (500 mLs Intravenous New Bag/Given 09/04/22 1133)  cefTRIAXone (ROCEPHIN) 2 g in sodium chloride 0.9 % 100 mL IVPB (2 g Intravenous New Bag/Given 09/04/22 1141)    ED Course/ Medical Decision Making/ A&P                           Medical Decision Making Amount and/or Complexity of Data Reviewed Labs: ordered. Radiology: ordered.  Risk Prescription drug management.   BP (!) 114/94   Pulse 60   Temp 98.1 F (36.7 C) (Oral)   Resp 17   Ht 6\' 1"  (1.854 m)   Wt 98 kg   SpO2 90%   BMI 28.50 kg/m   53:69 AM  56 year old male presenting complaining of left leg pain.  Patient recently diagnosed with having a DVT to left lower extremity on 08/27/2022.  He was  hospitalized on the same date for his presentation and was discharged on 08/30/2022.  He went home with oral apixaban.  He was given oxycodone for pain control.  Patient also is a dialysis patient on Monday Wednesday and Friday schedule.  Patient mention since being discharged he has been experiencing progressive worsening pain about his left lower extremity.  Yesterday he also noticed a blister that appears which concerns him.  He endorsed chills.  He worries for infection.  He is taking Tylenol at home without adequate relief.  He has been compliant with taking his Eliquis.  He did not take his midodrine medication this morning.  He was prescribed Percocet but states he did not take it because it does not provide any relief.  No fever chest pain productive cough.  On exam this is a chronically ill-appearing male laying in bed in some mild discomfort.  Heart and lung sounds normal.  He is mildly tachycardic.  Left lower extremity is significant for erythema edema and warmth noted to the dorsum of the leg from his mid shin extending towards the foot.  He also has a cluster of coalescing blister noted to the dorsum of the foot.  Area is exquisitely tender to palpation but he does have intact dorsalis pedis pulse.  Leg compartment is soft.  Currently on the monitor his systolic blood pressure is between 60-70 and he is tachycardic with heart rates above 100.  Patient states he normally takes Midodrin but did not take it this morning.  We will give his morning dose, gentle hydration, provide nonopiate pain medication, and will also give 2 g of Rocephin for treatment of suspected cellulitis in the setting of DVT.  Care discussed with Dr. Roderic Palau.  2:28 PM Labs and imaging obtained independently viewed interpreted by me.  Patient does have a mildly elevated lactic acid of 2.0, white count is 12.6 similar to his baseline.  Electrolyte panels are at baseline.  Chest x-ray without concerning airspace opacity.  After  receiving IV antibiotic, nonopiate pain medication, patient reported feeling better.  He still voices concerns about his difficulty ambulating secondary to pain.  I plan to request [facility] for outpatient support such as PT OT and home health aide.  I have considered hospital admission but in the setting of needing just better pain control and I have low suspicion for PE or other complicating feature I felt patient is stable to be discharged home.  I have agreed to prescribe antibiotic to cover for potential skin infection in the setting of DVT.  I gave return precaution.  Patient voiced understanding and agrees with plan.  I reviewed EMR and considered my plan of care.  I reviewed patient's EKG on heart monitor which shows sinus rhythm.  Blood pressure did improve with Midodrin home dosage.  Patient to use his walker at home.  Again strict return precaution.  Please note and vital signs patient did have a documented oxygen of 70%.  It was repeated and it is greater than 90%.  At no time was patient complaining of increased shortness of breath.  I have low suspicion for PE.        Final Clinical Impression(s) / ED Diagnoses Final diagnoses:  Acute deep vein thrombosis (DVT) of tibial vein of left lower extremity (HCC)  Left leg pain    Rx / DC Orders ED Discharge Orders          Ordered    doxycycline (VIBRAMYCIN) 100 MG capsule  2 times daily        09/04/22 1452              Domenic Moras, PA-C 09/04/22 1503    Milton Ferguson, MD 09/06/22 1050

## 2022-09-04 NOTE — ED Triage Notes (Signed)
Pt c/o left leg pain from knee to foot since he left the hospital on Monday for DVT. Pt states pain has progressively gotten worse.

## 2022-09-04 NOTE — Discharge Instructions (Addendum)
You have been evaluated for your symptoms.  Your pain is likely due to the recently diagnosed deep venous thrombosis involving your left leg.  Continue to take Eliquis as previously prescribed.  You may take doxycycline antibiotic for potential skin infection.  Continue to use walker to help with mobility.  Continue taking Percocet previously prescribed for pain control.  I will transition of care team will reach out to you on Monday to help with home health aide as well as physical therapy and Occupational Therapy as needed.  Return if you have any concern.

## 2022-09-15 ENCOUNTER — Other Ambulatory Visit (HOSPITAL_COMMUNITY): Payer: Self-pay | Admitting: Internal Medicine

## 2022-09-15 ENCOUNTER — Other Ambulatory Visit: Payer: Self-pay | Admitting: Internal Medicine

## 2022-09-15 DIAGNOSIS — I739 Peripheral vascular disease, unspecified: Secondary | ICD-10-CM

## 2022-09-28 ENCOUNTER — Ambulatory Visit (HOSPITAL_COMMUNITY)
Admission: RE | Admit: 2022-09-28 | Discharge: 2022-09-28 | Disposition: A | Discharge status: Home / Self Care | Payer: Medicare Other | Source: Ambulatory Visit | Attending: Internal Medicine | Admitting: Internal Medicine

## 2022-09-28 ENCOUNTER — Encounter (HOSPITAL_COMMUNITY): Payer: Self-pay | Admitting: Radiology

## 2022-09-28 DIAGNOSIS — I739 Peripheral vascular disease, unspecified: Secondary | ICD-10-CM | POA: Diagnosis present

## 2022-09-28 MED ORDER — IOHEXOL 350 MG/ML SOLN
125.0000 mL | Freq: Once | INTRAVENOUS | Status: AC | PRN
  Administered 2022-09-28: 125 mL via INTRAVENOUS

## 2022-10-14 ENCOUNTER — Encounter (HOSPITAL_BASED_OUTPATIENT_CLINIC_OR_DEPARTMENT_OTHER): Payer: Medicare Other | Attending: General Surgery | Admitting: General Surgery

## 2022-10-14 DIAGNOSIS — L97522 Non-pressure chronic ulcer of other part of left foot with fat layer exposed: Secondary | ICD-10-CM | POA: Diagnosis not present

## 2022-10-14 DIAGNOSIS — N186 End stage renal disease: Secondary | ICD-10-CM | POA: Diagnosis not present

## 2022-10-14 DIAGNOSIS — Z86718 Personal history of other venous thrombosis and embolism: Secondary | ICD-10-CM | POA: Insufficient documentation

## 2022-10-14 DIAGNOSIS — Z940 Kidney transplant status: Secondary | ICD-10-CM | POA: Insufficient documentation

## 2022-10-14 DIAGNOSIS — I5032 Chronic diastolic (congestive) heart failure: Secondary | ICD-10-CM | POA: Insufficient documentation

## 2022-10-14 DIAGNOSIS — I132 Hypertensive heart and chronic kidney disease with heart failure and with stage 5 chronic kidney disease, or end stage renal disease: Secondary | ICD-10-CM | POA: Insufficient documentation

## 2022-10-14 DIAGNOSIS — L97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: Secondary | ICD-10-CM | POA: Diagnosis present

## 2022-10-14 DIAGNOSIS — Z992 Dependence on renal dialysis: Secondary | ICD-10-CM | POA: Diagnosis not present

## 2022-10-14 DIAGNOSIS — L97822 Non-pressure chronic ulcer of other part of left lower leg with fat layer exposed: Secondary | ICD-10-CM | POA: Diagnosis not present

## 2022-10-15 NOTE — Progress Notes (Signed)
[facility], [place] ([id number]) [id number] Page 1 of 12 Visit Report for 10/14/2022 Allergy List Details Patient Name: Date of Service: [name] 10/14/2022 8:00 A M Medical Record Number: [id number] Patient Account Number: [id number] Date of Birth/Sex: Treating RN: [birth year] (56 y.o. [name] Primary Care Arliss Frisina: Dartha Lodge, [facility] Other Clinician: Referring Alexys Lobello: Treating Cheyenne Bordeaux/Extender: [name], [place] [name]: 0 Allergies Active Allergies vancomycin Severity: Severe codeine Severity: Severe Allergy Notes Electronic Signature(s) Signed: 10/14/2022 5:57:22 PM By: Dellie Catholic RN Entered By: Dellie Catholic on 10/14/2022 08:10:59 -------------------------------------------------------------------------------- Arrival Information Details Patient Name: Date of Service: [name] 10/14/2022 8:00 A M Medical Record Number: [id number] Patient Account Number: [id number] Date of Birth/Sex: Treating RN: Feb 10, 1966 (56 y.o. [name] Primary Care Antoino Westhoff: Dartha Lodge, [facility] Other Clinician: Referring Derreck Wiltsey: Treating Kelechi Astarita/Extender: [name], [place] [name]: 0 Visit Information Patient Arrived: Ambulatory Arrival Time: 08:05 Accompanied By: spouse Transfer Assistance: None Patient Identification Verified: Yes Patient Has Alerts: Yes Patient Alerts: Patient on Blood Thinner On Eliquis Electronic Signature(s) Signed: 10/14/2022 5:57:22 PM By: Dellie Catholic RN Entered By: Dellie Catholic on 10/14/2022 08:09:12 Blaszczyk, Teresa ([id number]) [id number] Page 2 of 12 -------------------------------------------------------------------------------- Clinic Level of Care Assessment Details Patient Name: Date of Service: [name] 10/14/2022 8:00 A M Medical Record Number: [id number] Patient Account Number: [id number] Date of Birth/Sex:  Treating RN: 05-11-1966 (56 y.o. [name] Primary Care Daylin Gruszka: Dartha Lodge, [facility] Other Clinician: Referring Guliana Weyandt: Treating Rowan Pollman/Extender: [name], [place] [name]: 0 Clinic Level of Care Assessment Items TOOL 1 Quantity Score X- 1 0 Use when EandM and Procedure is performed on INITIAL visit ASSESSMENTS - Nursing Assessment / Reassessment X- 1 20 General Physical Exam (combine w/ comprehensive assessment (listed just below) when performed on new pt. evals) X- 1 25 Comprehensive Assessment (HX, ROS, Risk Assessments, Wounds Hx, etc.) ASSESSMENTS - Wound and Skin Assessment / Reassessment X- 1 10 Dermatologic / Skin Assessment (not related to wound area) ASSESSMENTS - Ostomy and/or Continence Assessment and Care []  - 0 Incontinence Assessment and Management []  - 0 Ostomy Care Assessment and Management (repouching, etc.) PROCESS - Coordination of Care []  - 0 Simple Patient / Family Education for ongoing care X- 1 20 Complex (extensive) Patient / Family Education for ongoing care X- 1 10 Staff obtains Programmer, systems, Records, T Results / Process Orders est X- 1 10 Staff telephones HHA, Nursing Homes / Clarify orders / etc []  - 0 Routine Transfer to another Facility (non-emergent condition) []  - 0 Routine Hospital Admission (non-emergent condition) X- 1 15 New Admissions / Biomedical engineer / Ordering NPWT Apligraf, etc. , []  - 0 Emergency Hospital Admission (emergent condition) PROCESS - Special Needs []  - 0 Pediatric / Minor Patient Management []  - 0 Isolation Patient Management []  - 0 Hearing / Language / Visual special needs []  - 0 Assessment of Community assistance (transportation, D/C planning, etc.) []  - 0 Additional assistance / Altered mentation []  - 0 Support Surface(s) Assessment (bed, cushion, seat, etc.) INTERVENTIONS - Miscellaneous []  - 0 External ear exam []  - 0 Patient Transfer (multiple staff / Civil Service fast streamer  / Similar devices) []  - 0 Simple Staple / Suture removal (25 or less) []  - 0 Complex Staple / Suture removal (26 or more) []  - 0 Hypo/Hyperglycemic Management (do not check if billed separately) []  - 0 Ankle / Brachial Index (ABI) - do not check if billed separately Has the patient been  seen at the hospital within the last three years: Yes Total Score: 110 Level Of Care: New/Established - Level 3 Electronic Signature(s) Signed: 10/14/2022 5:57:22 PM By: Dellie Catholic RN Entered By: Dellie Catholic on 10/14/2022 17:42:28 Majchrzak, Dayshawn ([id number]) [id number] Page 3 of 12 -------------------------------------------------------------------------------- Encounter Discharge Information Details Patient Name: Date of Service: [name] 10/14/2022 8:00 A M Medical Record Number: [id number] Patient Account Number: [id number] Date of Birth/Sex: Treating RN: 04-Apr-1966 (56 y.o. [name] Primary Care Jasmin Winberry: Dartha Lodge, [facility] Other Clinician: Referring Wetzel Meester: Treating Gayle Martinez/Extender: [name], [place] [name]: 0 Encounter Discharge Information Items Post Procedure Vitals Discharge Condition: Stable Temperature (F): 97.8 Ambulatory Status: Ambulatory Pulse (bpm): 78 Discharge Destination: Home Respiratory Rate (breaths/min): 18 Transportation: Private Auto Blood Pressure (mmHg): 99/70 Accompanied By: spouse Schedule Follow-up Appointment: Yes Clinical Summary of Care: Patient Declined Electronic Signature(s) Signed: 10/14/2022 5:57:22 PM By: Dellie Catholic RN Entered By: Dellie Catholic on 10/14/2022 17:44:06 -------------------------------------------------------------------------------- Lower Extremity Assessment Details Patient Name: Date of Service: [name], [name] 10/14/2022 8:00 A M Medical Record Number: [id number] Patient Account Number: [id number] Date of Birth/Sex: Treating RN: 06-12-1966 (56 y.o.  [name] Primary Care Laelle Bridgett: Dartha Lodge, [facility] Other Clinician: Referring Yanin Muhlestein: Treating Lashea Goda/Extender: [name], [place] [name]: 0 Edema Assessment Assessed: [Left: No] [Right: No] [Left: Edema] [Right: :] Calf Left: Right: Point of Measurement: From Medial Instep 35 cm Ankle Left: Right: Point of Measurement: From Medial Instep 25.5 cm Vascular Assessment Pulses: Dorsalis Pedis Palpable: [Left:Yes] Electronic Signature(s) Signed: 10/14/2022 5:57:22 PM By: Dellie Catholic RN Entered By: Dellie Catholic on 10/14/2022 08:31:05 Kirley, Mickael ([id number]) [id number] Page 4 of 12 -------------------------------------------------------------------------------- Multi Wound Chart Details Patient Name: Date of Service: [name], Rashad 10/14/2022 8:00 A M Medical Record Number: [id number] Patient Account Number: [id number] Date of Birth/Sex: Treating RN: 01-19-1966 (56 y.o. M) Primary Care Reisha Wos: Dartha Lodge, [facility] Other Clinician: Referring Kinnie Kaupp: Treating Nithila Sumners/Extender: [name], [place] [name]: 0 Vital Signs Height(in): 73 Pulse(bpm): 78 Weight(lbs): 215 Blood Pressure(mmHg): 1/70 Body Mass Index(BMI): 28.4 Temperature(F): 97.8 Respiratory Rate(breaths/min): 18 [1:Photos:] Left, Anterior Lower Leg Left, Lateral Lower Leg Left Metatarsal head first Wound Location: Gradually Appeared Gradually Appeared Gradually Appeared Wounding Event: Arterial Insufficiency Ulcer Abrasion Lesion Primary Etiology: Arrhythmia, Congestive Heart Failure, Arrhythmia, Congestive Heart Failure, Arrhythmia, Congestive Heart Failure, Comorbid History: Coronary Artery Disease, Coronary Artery Disease, Coronary Artery Disease, Hypertension, End Stage Renal Hypertension, End Stage Renal Hypertension, End Stage Renal Disease Disease Disease 09/04/2022 09/29/2022 09/29/2022 Date Acquired: 0 0  0 Weeks of Treatment: Open Open Open Wound Status: No No No Wound Recurrence: 11x9x0.1 1.1x1.2x0.1 0.1x0.4x0.1 Measurements L x W x D (cm) 77.754 1.037 0.031 A (cm) : rea 7.775 0.104 0.003 Volume (cm) : Full Thickness With Exposed Support Full Thickness Without Exposed Full Thickness Without Exposed Classification: Structures Support Structures Support Structures Large Medium Medium Exudate A mount: Serosanguineous Serosanguineous Serosanguineous Exudate Type: red, brown red, brown red, brown Exudate Color: Medium (34-66%) Medium (34-66%) Large (67-100%) Granulation A mount: Pink Red Red Granulation Quality: Medium (34-66%) Medium (34-66%) Small (1-33%) Necrotic A mount: Eschar, Adherent Slough Eschar, Adherent Slough Eschar, Adherent Slough Necrotic Tissue: Fat Layer (Subcutaneous Tissue): Yes Fat Layer (Subcutaneous Tissue): Yes Fat Layer (Subcutaneous Tissue): Yes Exposed Structures: Tendon: Yes Fascia: No Fascia: No Fascia: No Tendon: No Tendon: No Muscle: No Muscle: No Muscle: No Joint: No Joint: No Joint: No Bone: No Bone: No Bone: No N/A None  None Epithelialization: Debridement - Excisional Debridement - Selective/Open Wound Debridement - Selective/Open Wound Debridement: Pre-procedure Verification/Time Out 09:03 09:03 09:03 Taken: Lidocaine 4% Topical Solution Lidocaine 4% Topical Solution Lidocaine 4% T opical Solution Pain Control: Necrotic/Eschar, Subcutaneous, Slough Callus, Slough Tissue Debrided: Slough Skin/Subcutaneous Tissue Non-Viable Tissue Non-Viable Tissue Level: 99 1.32 0.04 Debridement A (sq cm): rea Blade, Forceps, Scissors Blade, Forceps, Scissors Blade, Forceps, Scissors Instrument: Minimum Minimum Minimum Bleeding: Pressure Pressure Pressure Hemostasis Achieved: 0 0 0 Procedural Pain: 0 0 0 Post Procedural Pain: Debridement Treatment Response: Procedure was tolerated well Procedure was tolerated well Procedure was  tolerated well Post Debridement Measurements L x 11x9x0.1 1.1x1.2x0.1 0.1x0.4x0.1 W x D (cm) 7.775 0.104 0.003 Post Debridement Volume: (cm) Magill, Hilario ([id number]) [id number] Page 5 of 12 Callus: No Excoriation: No No Abnormalities Noted Periwound Skin Texture: Induration: No Callus: No Crepitus: No Rash: No Scarring: No Dry/Scaly: Yes Maceration: No Dry/Scaly: Yes Periwound Skin Moisture: Dry/Scaly: No Hemosiderin Staining: Yes Atrophie Blanche: No No Abnormalities Noted Periwound Skin Color: Cyanosis: No Ecchymosis: No Erythema: No Hemosiderin Staining: No Mottled: No Pallor: No Rubor: No No Abnormality No Abnormality No Abnormality Temperature: Debridement Debridement Debridement Procedures Performed: Treatment Notes Electronic Signature(s) Signed: 10/14/2022 9:23:47 AM By: Fredirick Maudlin MD FACS Entered By: Fredirick Maudlin on 10/14/2022 09:23:46 -------------------------------------------------------------------------------- Multi-Disciplinary Care Plan Details Patient Name: Date of Service: [name], Ki 10/14/2022 8:00 A M Medical Record Number: [id number] Patient Account Number: [id number] Date of Birth/Sex: Treating RN: 09-02-1966 (56 y.o. [name] Primary Care Alassane Kalafut: Dartha Lodge, [facility] Other Clinician: Referring Griffey Nicasio: Treating Rodrick Payson/Extender: [name], [place] [name]: 0 Active Inactive Wound/Skin Impairment Nursing Diagnoses: Impaired tissue integrity Goals: Patient/caregiver will verbalize understanding of skin care regimen Date Initiated: 10/14/2022 Target Resolution Date: 05/15/2023 Goal Status: Active Interventions: Assess ulceration(s) every visit Treatment Activities: Skin care regimen initiated : 10/14/2022 Notes: Electronic Signature(s) Signed: 10/14/2022 5:57:22 PM By: Dellie Catholic RN Entered By: Dellie Catholic on 10/14/2022 17:41:17 Parada, Pax  ([id number]) [id number] Page 6 of 12 -------------------------------------------------------------------------------- Pain Assessment Details Patient Name: Date of Service: [name] 10/14/2022 8:00 A M Medical Record Number: [id number] Patient Account Number: [id number] Date of Birth/Sex: Treating RN: [birth year] (56 y.o. [name] Primary Care Evelen Vazguez: Dartha Lodge, [facility] Other Clinician: Referring Charda Janis: Treating Marcus Groll/Extender: [name], [place] [name]: 0 Active Problems Location of Pain Severity and Description of Pain Patient Has Paino Yes Site Locations Pain Location: Generalized Pain With Dressing Change: No Duration of the Pain. Constant / Intermittento Constant Rate the pain. Current Pain Level: 4 Worst Pain Level: 10 Least Pain Level: 3 Tolerable Pain Level: 4 Character of Pain Describe the Pain: Difficult to Pinpoint Pain Management and Medication Current Pain Management: Medication: Yes Cold Application: No Rest: Yes Massage: No Activity: No T.E.N.S.: No Heat Application: No Leg drop or elevation: No Is the Current Pain Management Adequate: Adequate Electronic Signature(s) Signed: 10/14/2022 5:57:22 PM By: Dellie Catholic RN Entered By: Dellie Catholic on 10/14/2022 09:04:38 -------------------------------------------------------------------------------- Patient/Caregiver Education Details Patient Name: Date of Service: [name] 11/30/2023andnbsp8:00 Reasnor Record Number: [id number] Patient Account Number: [id number] Date of Birth/Gender: Treating RN: [birth year] (57 y.o. [name] Primary Care Physician: Dartha Lodge, [facility] Other Clinician: Referring Physician: Treating Physician/Extender: [name], [place] [name]: 0 Education Assessment Education Provided To: Patient Education Topics Provided Wound/Skin Impairment: Methods:  Explain/Verbal [facility], Jaimin ([id number]) [id number] Page 7 of 12 Responses: Return demonstration correctly Electronic  Signature(s) Signed: 10/14/2022 5:57:22 PM By: Dellie Catholic RN Entered By: Dellie Catholic on 10/14/2022 17:41:25 -------------------------------------------------------------------------------- Wound Assessment Details Patient Name: Date of Service: [name], Almir 10/14/2022 8:00 A M Medical Record Number: [id number] Patient Account Number: [id number] Date of Birth/Sex: Treating RN: 1966-02-10 (56 y.o. [name] Primary Care Raliyah Montella: Dartha Lodge, [facility] Other Clinician: Referring Lien Lyman: Treating Kailen Hinkle/Extender: [name], [place] [name]: 0 Wound Status Wound Number: 1 Primary Arterial Insufficiency Ulcer Etiology: Wound Location: Left, Anterior Lower Leg Wound Open Wounding Event: Gradually Appeared Status: Date Acquired: 09/04/2022 Comorbid Arrhythmia, Congestive Heart Failure, Coronary Artery Disease, Weeks Of Treatment: 0 History: Hypertension, End Stage Renal Disease Clustered Wound: No Photos Wound Measurements Length: (cm) 11 Width: (cm) 9 Depth: (cm) 0.1 Area: (cm) 77.754 Volume: (cm) 7.775 % Reduction in Area: % Reduction in Volume: Tunneling: No Undermining: No Wound Description Classification: Full Thickness With Exposed Support Structures Exudate Amount: Large Exudate Type: Serosanguineous Exudate Color: red, brown Foul Odor After Cleansing: No Slough/Fibrino No Wound Bed Granulation Amount: Medium (34-66%) Exposed Structure Granulation Quality: Pink Fascia Exposed: No Necrotic Amount: Medium (34-66%) Fat Layer (Subcutaneous Tissue) Exposed: Yes Necrotic Quality: Eschar, Adherent Slough Tendon Exposed: Yes Muscle Exposed: No Joint Exposed: No Bone Exposed: No Periwound Skin Texture Texture Color No Abnormalities Noted: No No Abnormalities Noted: No Callus:  No Hemosiderin Staining: Yes Moisture Temperature / Pain Patane, Farah ([id number]) [id number] Page 8 of 12 No Abnormalities Noted: No Temperature: No Abnormality Dry / Scaly: Yes Treatment Notes Wound #1 (Lower Leg) Wound Laterality: Left, Anterior Cleanser Soap and Water Discharge Instruction: May shower and wash wound with dial antibacterial soap and water prior to dressing change. Wound Cleanser Discharge Instruction: Cleanse the wound with wound cleanser prior to applying a clean dressing using gauze sponges, not tissue or cotton balls. Peri-Wound Care Topical Skintegrity Hydrogel 4 (oz) Discharge Instruction: Apply hydrogel as directed Primary Dressing Promogran Prisma Matrix, 4.34 (sq in) (silver collagen) Discharge Instruction: Moisten collagen with saline or hydrogel Secondary Dressing T Non-Adherent Dressing, 3x4 in elfa Discharge Instruction: Apply over primary dressing as directed. Secured With [employer], 4.5x3.1 (in/yd) Discharge Instruction: Secure with Kerlix as directed. 58M Medipore Soft Cloth Surgical T 2x10 (in/yd) ape Discharge Instruction: Secure with tape as directed. Compression Wrap Compression Stockings Add-Ons Electronic Signature(s) Signed: 10/14/2022 5:57:22 PM By: Dellie Catholic RN Entered By: Dellie Catholic on 10/14/2022 08:43:15 -------------------------------------------------------------------------------- Wound Assessment Details Patient Name: Date of Service: [name] 10/14/2022 8:00 A M Medical Record Number: [id number] Patient Account Number: [id number] Date of Birth/Sex: Treating RN: 29-Feb-1966 (56 y.o. [name] Primary Care Elmer Boutelle: Dartha Lodge, [facility] Other Clinician: Referring Beldon Nowling: Treating Mercedees Convery/Extender: [name], [place] [name]: 0 Wound Status Wound Number: 2 Primary Abrasion Etiology: Wound Location: Left, Lateral Lower Leg Wound  Open Wounding Event: Gradually Appeared Status: Date Acquired: 09/29/2022 Comorbid Arrhythmia, Congestive Heart Failure, Coronary Artery Disease, Weeks Of Treatment: 0 History: Hypertension, End Stage Renal Disease Clustered Wound: No Photos [facility], Rolan ([id number]) [id number] Page 9 of 12 Wound Measurements Length: (cm) 1.1 Width: (cm) 1.2 Depth: (cm) 0.1 Area: (cm) 1.037 Volume: (cm) 0.104 % Reduction in Area: % Reduction in Volume: Epithelialization: None Tunneling: No Undermining: No Wound Description Classification: Full Thickness Without Exposed Support Structures Exudate Amount: Medium Exudate Type: Serosanguineous Exudate Color: red, brown Foul Odor After Cleansing: No Slough/Fibrino Yes Wound Bed Granulation Amount: Medium (34-66%) Exposed Structure Granulation Quality: Red Fascia Exposed: No Necrotic Amount: Medium (34-66%)  Fat Layer (Subcutaneous Tissue) Exposed: Yes Necrotic Quality: Eschar, Adherent Slough Tendon Exposed: No Muscle Exposed: No Joint Exposed: No Bone Exposed: No Periwound Skin Texture Texture Color No Abnormalities Noted: Yes No Abnormalities Noted: No Atrophie Blanche: No Moisture Cyanosis: No No Abnormalities Noted: No Ecchymosis: No Dry / Scaly: No Erythema: No Maceration: No Hemosiderin Staining: No Mottled: No Pallor: No Rubor: No Temperature / Pain Temperature: No Abnormality Treatment Notes Wound #2 (Lower Leg) Wound Laterality: Left, Lateral Cleanser Soap and Water Discharge Instruction: May shower and wash wound with dial antibacterial soap and water prior to dressing change. Wound Cleanser Discharge Instruction: Cleanse the wound with wound cleanser prior to applying a clean dressing using gauze sponges, not tissue or cotton balls. Peri-Wound Care Topical Skintegrity Hydrogel 4 (oz) Discharge Instruction: Apply hydrogel as directed Primary Dressing Promogran Prisma Matrix, 4.34  (sq in) (silver collagen) Discharge Instruction: Moisten collagen with saline or hydrogel Secondary Dressing T Non-Adherent Dressing, 3x4 in elfa Discharge Instruction: Apply over primary dressing as directed. [facility], Connell ([id number]) [id number] Page 10 of 12 Secured With [employer], 4.5x3.1 (in/yd) Discharge Instruction: Secure with Kerlix as directed. 47M Medipore Soft Cloth Surgical T 2x10 (in/yd) ape Discharge Instruction: Secure with tape as directed. Compression Wrap Compression Stockings Add-Ons Electronic Signature(s) Signed: 10/14/2022 5:57:22 PM By: Dellie Catholic RN Entered By: Dellie Catholic on 10/14/2022 08:44:25 -------------------------------------------------------------------------------- Wound Assessment Details Patient Name: Date of Service: [name] 10/14/2022 8:00 A M Medical Record Number: [id number] Patient Account Number: [id number] Date of Birth/Sex: Treating RN: June 02, 1966 (56 y.o. [name] Primary Care Sami Froh: Dartha Lodge, [facility] Other Clinician: Referring Jaklyn Alen: Treating Khameron Gruenwald/Extender: [name], [place] [name]: 0 Wound Status Wound Number: 3 Primary Lesion Etiology: Wound Location: Left Metatarsal head first Wound Open Wounding Event: Gradually Appeared Status: Date Acquired: 09/29/2022 Comorbid Arrhythmia, Congestive Heart Failure, Coronary Artery Disease, Weeks Of Treatment: 0 History: Hypertension, End Stage Renal Disease Clustered Wound: No Photos Wound Measurements Length: (cm) 0.1 Width: (cm) 0.4 Depth: (cm) 0.1 Area: (cm) 0.031 Volume: (cm) 0.003 % Reduction in Area: % Reduction in Volume: Epithelialization: None Tunneling: No Undermining: No Wound Description Classification: Full Thickness Without Exposed Support Structures Exudate Amount: Medium Exudate Type: Serosanguineous Exudate Color: red, brown Foul Odor After Cleansing:  No Slough/Fibrino Yes Wound Bed Granulation Amount: Large (67-100%) Exposed Structure Granulation Quality: Red Fascia Exposed: No Necrotic Amount: Small (1-33%) Fat Layer (Subcutaneous Tissue) Exposed: Yes Necrotic Quality: Eschar, Adherent Slough Tendon Exposed: No Muscle Exposed: No Azzaro, Evans ([id number]) [id number] Page 11 of 12 Joint Exposed: No Bone Exposed: No Periwound Skin Texture Texture Color No Abnormalities Noted: Yes No Abnormalities Noted: Yes Moisture Temperature / Pain No Abnormalities Noted: No Temperature: No Abnormality Dry / Scaly: Yes Treatment Notes Wound #3 (Metatarsal head first) Wound Laterality: Left Cleanser Soap and Water Discharge Instruction: May shower and wash wound with dial antibacterial soap and water prior to dressing change. Wound Cleanser Discharge Instruction: Cleanse the wound with wound cleanser prior to applying a clean dressing using gauze sponges, not tissue or cotton balls. Peri-Wound Care Topical Skintegrity Hydrogel 4 (oz) Discharge Instruction: Apply hydrogel as directed Primary Dressing Promogran Prisma Matrix, 4.34 (sq in) (silver collagen) Discharge Instruction: Moisten collagen with saline or hydrogel Secondary Dressing T Non-Adherent Dressing, 3x4 in elfa Discharge Instruction: Apply over primary dressing as directed. Secured With [employer], 4.5x3.1 (in/yd) Discharge Instruction: Secure with Kerlix as directed. 47M Medipore Soft Cloth Surgical T 2x10 (in/yd) ape Discharge Instruction: Secure  with tape as directed. Compression Wrap Compression Stockings Add-Ons Electronic Signature(s) Signed: 10/14/2022 5:57:22 PM By: Dellie Catholic RN Entered By: Dellie Catholic on 10/14/2022 08:44:56 -------------------------------------------------------------------------------- Vitals Details Patient Name: Date of Service: [name] 10/14/2022 8:00 A M Medical Record  Number: [id number] Patient Account Number: [id number] Date of Birth/Sex: Treating RN: 10/04/1966 (56 y.o. [name] Primary Care Candela Krul: Dartha Lodge, [facility] Other Clinician: Referring Jameshia Hayashida: Treating Keeana Pieratt/Extender: [name], [place] [name]: 0 Vital Signs Time Taken: 08:05 Temperature (F): 97.8 Height (in): 73 Pulse (bpm): 78 Weight (lbs): 215 Respiratory Rate (breaths/min): 18 Body Mass Index (BMI): 28.4 Blood Pressure (mmHg): 99/70 Reference Range: 80 - 120 mg / dl [facility], Titan ([id number]) [id number] Page 12 of 12 Electronic Signature(s) Signed: 10/14/2022 5:57:22 PM By: Dellie Catholic RN Entered By: Dellie Catholic on 10/14/2022 08:10:29

## 2022-10-15 NOTE — Progress Notes (Signed)
[facility], [name] ([id number]) [id number] Page 1 of 4 Visit Report for 10/14/2022 Abuse Risk Screen Details Patient Name: Date of Service: [name], [name] 10/14/2022 8:00 A M Medical Record Number: [id number] Patient Account Number: [id number] Date of Birth/Sex: Treating RN: [birth year] (56 y.o. [name] Primary Care [name]: [name], [facility] Other Clinician: Referring [name]: Treating [name]/Extender: [name], [place] Weeks in [name] Abuse Risk Screen Items Answer ABUSE RISK SCREEN: Has anyone close to you tried to hurt or harm you recentlyo No Do you feel uncomfortable with anyone in your familyo No Has anyone forced you do things that you didnt want to doo No Electronic Signature(s) Signed: 10/14/2022 5:57:22 PM By: [name] Catholic RN Entered By: [name] on 10/14/2022 08:17:39 -------------------------------------------------------------------------------- Activities of Daily Living Details Patient Name: Date of Service: [name], [name] 10/14/2022 8:00 [place] Record Number: [id number] Patient Account Number: [id number] Date of Birth/Sex: Treating RN: 21-Jul-1966 (56 y.o. [name] Primary Care [name]: [name], [facility] Other Clinician: Referring [name]: Treating [name]/Extender: [name], [place] Weeks in [name] Activities of Daily Living Items Answer Activities of Daily Living (Please select one for each item) Drive Automobile Completely Able T Medications ake Completely Able Use T elephone Completely Able Care for Appearance Completely Able Use T oilet Completely Able Bath / Shower Completely Able Dress Self Completely Able Feed Self Completely Able Walk Completely Able Get In / Out Bed Completely Able Housework Completely Able Prepare Meals Completely Able Handle Money Completely Able Shop for Self Completely Able Electronic Signature(s) Signed:  10/14/2022 5:57:22 PM By: [name] Catholic RN Entered By: [name] on 10/14/2022 08:18:56 [place], Kailo ([id number]) [id number] Page 2 of 4 -------------------------------------------------------------------------------- Education Screening Details Patient Name: Date of Service: [name], [name] 10/14/2022 8:00 A M Medical Record Number: [id number] Patient Account Number: [id number] Date of Birth/Sex: Treating RN: [birth year] (56 y.o. [name] Primary Care [name]: [name], [facility] Other Clinician: Referring [name]: Treating [name]/Extender: [name], [name] Learning Preferences/Education Level/Primary Language Learning Preference: Explanation, Demonstration, Printed Material Highest Education Level: High School Preferred Language: English Cognitive Barrier Language Barrier: No Translator Needed: No Memory Deficit: No Emotional Barrier: No Cultural/Religious Beliefs Affecting Medical Care: No Physical Barrier Impaired Vision: No Impaired Hearing: No Decreased Hand dexterity: No Knowledge/Comprehension Knowledge Level: High Comprehension Level: High Ability to understand written instructions: High Ability to understand verbal instructions: High Motivation Anxiety Level: Calm Cooperation: Cooperative Education Importance: Acknowledges Need Interest in Health Problems: Asks Questions Perception: Coherent Willingness to Engage in Self-Management High Activities: Readiness to Engage in Self-Management High Activities: Electronic Signature(s) Signed: 10/14/2022 5:57:22 PM By: [name] Catholic RN Entered By: [name] on 10/14/2022 08:19:53 -------------------------------------------------------------------------------- Fall Risk Assessment Details Patient Name: Date of Service: [name], [name] 10/14/2022 8:00 [name] Record Number: [id number] Patient Account Number:  [id number] Date of Birth/Sex: Treating RN: 01-20-1966 (56 y.o. [name] Primary Care [name]: [name], [facility] Other Clinician: Referring [name]: Treating [name]/Extender: [name], [place] Weeks in [name] Fall Risk Assessment Items Have you had 2 or more falls in the last 12 monthso 0 No Have you had any fall that resulted in injury in the last 12 monthso 0 No [name], [name] ([id number]) [id number] Page 3 of 4 FALLS RISK SCREEN History of falling - immediate or within 3 months 0 No Secondary diagnosis (Do you have 2 or more medical diagnoseso) 0 No Ambulatory  aid None/bed rest/wheelchair/nurse 0 No Crutches/cane/walker 0 No Furniture 0 No Intravenous therapy Access/Saline/Heparin Lock 0 No Gait/Transferring Normal/ bed rest/ wheelchair 0 No Weak (short steps with or without shuffle, stooped but able to lift head while walking, may seek 0 No support from furniture) Impaired (short steps with shuffle, may have difficulty arising from chair, head down, impaired 0 No balance) Mental Status Oriented to own ability 0 No Electronic Signature(s) Signed: 10/14/2022 5:57:22 PM By: [name] Catholic RN Entered By: [name] on 10/14/2022 08:19:59 -------------------------------------------------------------------------------- Foot Assessment Details Patient Name: Date of Service: [name], [name] 10/14/2022 8:00 A M Medical Record Number: [id number] Patient Account Number: [id number] Date of Birth/Sex: Treating RN: 1966/08/15 (56 y.o. [name] Primary Care Jadis Mika: [name], [facility] Other Clinician: Referring [name]: Treating [name]/Extender: [name], [name] Foot Assessment Items Site Locations + = Sensation present, - = Sensation absent, C = Callus, U = Ulcer R = Redness, W = Warmth, M = Maceration, PU = Pre-ulcerative lesion F = Fissure, S = Swelling, D =  Dryness Assessment Right: Left: Other Deformity: No No Prior Foot Ulcer: No No Prior Amputation: No No Charcot Joint: No No Ambulatory Status: Ambulatory Without Help Gait: Steady [name], [name] ([id number]) [id number] Page 4 of 4 Electronic Signature(s) Signed: 10/14/2022 5:57:22 PM By: [name] Catholic RN Entered By: [name] on 10/14/2022 08:26:02 -------------------------------------------------------------------------------- Nutrition Risk Screening Details Patient Name: Date of Service: [name], [name] 10/14/2022 8:00 A M Medical Record Number: [id number] Patient Account Number: [id number] Date of Birth/Sex: Treating RN: 05/31/1966 (56 y.o. [name] Primary Care Yostin Malacara: [name], [facility] Other Clinician: Referring Swannie Milius: Treating Fedor Kazmierski/Extender: [name], [place] Weeks in [name] Height (in): 73 Weight (lbs): 215 Body Mass Index (BMI): 28.4 Nutrition Risk Screening Items Score Screening NUTRITION RISK SCREEN: I have an illness or condition that made me change the kind and/or amount of food I eat 0 No I eat fewer than two meals per day 0 No I eat few fruits and vegetables, or milk products 0 No I have three or more drinks of beer, liquor or wine almost every day 0 No I have tooth or mouth problems that make it hard for me to eat 0 No I don't always have enough money to buy the food I need 0 No I eat alone most of the time 0 No I take three or more different prescribed or over-the-counter drugs a day 0 No Without wanting to, I have lost or gained 10 pounds in the last six months 0 No I am not always physically able to shop, cook and/or feed myself 0 No Nutrition Protocols Good Risk Protocol 0 No interventions needed Moderate Risk Protocol High Risk Proctocol Risk Level: Good Risk Score: 0 Electronic Signature(s) Signed: 10/14/2022 5:57:22 PM By: [name] Catholic RN Entered By: [name]  on 10/14/2022 08:20:39

## 2022-10-15 NOTE — Progress Notes (Signed)
[facility], [place] ([id number]) [id number] Page 1 of 12 Visit Report for 10/14/2022 Chief Complaint Document Details Patient Name: Date of Service: WO [place] [name], [name] 10/14/2022 8:00 A M Medical Record Number: [id number] Patient Account Number: [id number] Date of Birth/Sex: Treating RN: 05/25/1966 (56 y.o. M) Primary Care Provider: Dartha Lodge, [facility] Other Clinician: Referring Provider: Treating Provider/Extender: [name], [name] Information Obtained from: Patient Chief Complaint Patient seen for complaints of Non-Healing Wounds. Electronic Signature(s) Signed: 10/14/2022 9:24:03 AM By: Fredirick Maudlin [name] Entered By: Fredirick Maudlin on 10/14/2022 09:24:02 -------------------------------------------------------------------------------- Debridement Details Patient Name: Date of Service: [name], [name] 10/14/2022 8:00 A M Medical Record Number: [id number] Patient Account Number: [id number] Date of Birth/Sex: Treating RN: September 22, 1966 (56 y.o. [name] Primary Care Provider: Dartha Lodge, [facility] Other Clinician: Referring Provider: Treating Provider/Extender: [name], [place] Weeks in [name] Debridement Performed for Assessment: Wound #2 Left,Lateral Lower Leg Performed By: Physician Fredirick Maudlin, [name] Debridement Type: Debridement Level of Consciousness (Pre-procedure): Awake and Alert Pre-procedure Verification/Time Out Yes - 09:03 Taken: Start Time: 09:03 Pain Control: Lidocaine 4% T opical Solution T Area Debrided (L x W): otal 1.1 (cm) x 1.2 (cm) = 1.32 (cm) Tissue and other material debrided: Non-Viable, Slough, Slough Level: Non-Viable Tissue Debridement Description: Selective/Open Wound Instrument: Blade, Forceps, Scissors Bleeding: Minimum Hemostasis Achieved: Pressure End Time: 09:04 Procedural Pain: 0 Post Procedural Pain: 0 Response to [name]: Procedure was tolerated  well Level of Consciousness (Post- Awake and Alert procedure): Post Debridement Measurements of Total Wound Length: (cm) 1.1 Width: (cm) 1.2 Depth: (cm) 0.1 Volume: (cm) 0.104 Character of Wound/Ulcer Post Debridement: Improved Post Procedure Diagnosis Epps, [name] ([id number]) [id number] Page 2 of 12 Same as Pre-procedure Notes Scribed for Dr. Celine Ahr by J.Scotton Electronic Signature(s) Signed: 10/14/2022 12:45:53 PM By: Fredirick Maudlin [name] Signed: 10/14/2022 5:57:22 PM By: Dellie Catholic RN Entered By: Dellie Catholic on 10/14/2022 09:07:40 -------------------------------------------------------------------------------- Debridement Details Patient Name: Date of Service: [name], [name] 10/14/2022 8:00 A M Medical Record Number: [id number] Patient Account Number: [id number] Date of Birth/Sex: Treating RN: 01-27-1966 (56 y.o. [name] Primary Care Provider: Dartha Lodge, [facility] Other Clinician: Referring Provider: Treating Provider/Extender: [name], [place] Weeks in [name] Debridement Performed for Assessment: Wound #3 Left Metatarsal head first Performed By: Physician Fredirick Maudlin, [name] Debridement Type: Debridement Level of Consciousness (Pre-procedure): Awake and Alert Pre-procedure Verification/Time Out Yes - 09:03 Taken: Start Time: 09:03 Pain Control: Lidocaine 4% T opical Solution T Area Debrided (L x W): otal 0.1 (cm) x 0.4 (cm) = 0.04 (cm) Tissue and other material debrided: Non-Viable, Callus, Slough, Slough Level: Non-Viable Tissue Debridement Description: Selective/Open Wound Instrument: Blade, Forceps, Scissors Bleeding: Minimum Hemostasis Achieved: Pressure End Time: 09:04 Procedural Pain: 0 Post Procedural Pain: 0 Response to [name]: Procedure was tolerated well Level of Consciousness (Post- Awake and Alert procedure): Post Debridement Measurements of Total Wound Length: (cm)  0.1 Width: (cm) 0.4 Depth: (cm) 0.1 Volume: (cm) 0.003 Character of Wound/Ulcer Post Debridement: Improved Post Procedure Diagnosis Same as Pre-procedure Notes Scribed for Dr. Celine Ahr by J.Scotton Electronic Signature(s) Signed: 10/14/2022 12:45:53 PM By: Fredirick Maudlin [name] Signed: 10/14/2022 5:57:22 PM By: Dellie Catholic RN Entered By: Dellie Catholic on 10/14/2022 09:10:12 [name], [name] [id number] Page 3 of 12 -------------------------------------------------------------------------------- Debridement Details Patient Name: Date of Service: [name], [name] 10/14/2022 8:00 [place] Record Number: [id number] Patient Account Number: [id number] Date of Birth/Sex: Treating RN: 1966-03-01 (56 y.o.  M) Primary Care Provider: Dartha Lodge, [facility] Other Clinician: Referring Provider: Treating Provider/Extender: [name], [place] Weeks in [name] Debridement Performed for Assessment: Wound #1 Left,Anterior Lower Leg Performed By: Physician Fredirick Maudlin, [name] Debridement Type: Debridement Severity of Tissue Pre Debridement: Fat layer exposed Level of Consciousness (Pre-procedure): Awake and Alert Pre-procedure Verification/Time Out Yes - 09:03 Taken: Start Time: 09:03 Pain Control: Lidocaine 4% T opical Solution T Area Debrided (L x W): otal 11 (cm) x 9 (cm) = 99 (cm) Tissue and other material debrided: Non-Viable, Eschar, Muscle, Slough, Subcutaneous, Slough Level: Skin/Subcutaneous Tissue/Muscle Debridement Description: Excisional Instrument: Blade, Forceps, Scissors Bleeding: Minimum Hemostasis Achieved: Pressure End Time: 09:04 Procedural Pain: 0 Post Procedural Pain: 0 Response to [name]: Procedure was tolerated well Level of Consciousness (Post- Awake and Alert procedure): Post Debridement Measurements of Total Wound Length: (cm) 11 Width: (cm) 9 Depth: (cm) 0.1 Volume: (cm) 7.775 Character of  Wound/Ulcer Post Debridement: Improved Severity of Tissue Post Debridement: Fat layer exposed Post Procedure Diagnosis Same as Pre-procedure Notes Scribed for Dr. Celine Ahr by J.Scotton Electronic Signature(s) Signed: 10/14/2022 10:41:58 AM By: Fredirick Maudlin [name] Entered By: Fredirick Maudlin on 10/14/2022 10:41:58 -------------------------------------------------------------------------------- HPI Details Patient Name: Date of Service: [name], [name] 10/14/2022 8:00 A M Medical Record Number: [id number] Patient Account Number: [id number] Date of Birth/Sex: Treating RN: 12/24/1965 (56 y.o. M) Primary Care Provider: Dartha Lodge, [facility] Other Clinician: Referring Provider: Treating Provider/Extender: [name], [name] History of Present Illness HPI Description: ADMISSION 10/14/2022 This is a 56 year old man with end-stage renal disease on hemodialysis status post kidney transplant which has subsequently failed. He has congestive heart failure and paroxysmal atrial fibrillation. He presented to the emergency department in mid October with left leg swelling. He was diagnosed with an acute DVT and subsequently anticoagulated. A few days later, he was seen in the emergency room with increased warmth, redness, and pain in his leg. He also had developed a blister on his dorsal ankle. He was given a course of antibiotics to treat cellulitis. Over the next few weeks, the tissue began to breakdown and he [facility], [place] ([id number]) [phone].pdf Page 4 of 12 has developed a large ulceration on his dorsal foot and ankle, as well as his lateral leg. He also had a callus on his left first metatarsal head that has broken down and he has a small ulcer there. He is not diabetic. He had a recent aortogram performed that demonstrated vascular calcification, but all of his arteries were patent and he had outflow all the way into his distal foot. No  changes were seen in the bone to suggest osteomyelitis. On his dorsal foot and ankle, there is a thick black eschar overlying a layer of slough with tendon exposure and necrosis of muscle. On his first metatarsal head, there is a callus with a tiny linear opening into the fat layer. On his lateral calf, there is a third wound that is smaller, circular, and also exposes the fat layer. There is no malodor or purulent drainage from any of the wound sites. Electronic Signature(s) Signed: 10/14/2022 10:37:11 AM By: Fredirick Maudlin [name] Previous Signature: 10/14/2022 9:25:50 AM Version By: Fredirick Maudlin [name] Entered By: Fredirick Maudlin on 10/14/2022 10:37:11 -------------------------------------------------------------------------------- Physical Exam Details Patient Name: Date of Service: [name], [name] 10/14/2022 8:00 A M Medical Record Number: [id number] Patient Account Number: [id number] Date of Birth/Sex: Treating RN: 15-Dec-1965 (56 y.o. M) Primary Care Provider: Dartha Lodge, [facility]  Other Clinician: Referring Provider: Treating Provider/Extender: [name], [name] Constitutional Hypotensive but asymptomatic; this is apparently normal for this patient. . . . No acute distress. Respiratory Normal work of breathing on room air.. Notes 10/14/2022: On his dorsal foot and ankle, there is a thick black eschar overlying a layer of slough with tendon exposure and necrosis of muscle. On his first metatarsal head, there is a callus with a tiny linear opening into the fat layer. On his lateral calf, there is a third wound that is smaller, circular, and also exposes the fat layer. There is no malodor or purulent drainage from any of the wound sites. Electronic Signature(s) Signed: 10/14/2022 10:37:56 AM By: Fredirick Maudlin [name] Entered By: Fredirick Maudlin on 10/14/2022  10:37:56 -------------------------------------------------------------------------------- Physician Orders Details Patient Name: Date of Service: [name], [name] 10/14/2022 8:00 A M Medical Record Number: [id number] Patient Account Number: [id number] Date of Birth/Sex: Treating RN: 01-30-1966 (56 y.o. 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 - Dr. Celine Ahr Room 3 Return A Anesthetic (In clinic) Topical Lidocaine 4% applied to wound bed - Used in Clinic Bathing/ Shower/ Hygiene May shower with protection but do not get wound dressing(s) wet. - Wound [name] Wound #1 - Lower Leg Wound Laterality: Left, Anterior Cleanser: Soap and Water 1 x Per Day/30 Days Discharge Instructions: May shower and wash wound with dial antibacterial soap and water prior to dressing change. Cleanser: Wound Cleanser 1 x Per Day/30 Days Discharge Instructions: Cleanse the wound with wound cleanser prior to applying a clean dressing using gauze sponges, not tissue or cotton balls. Topical: Skintegrity Hydrogel 4 (oz) (DME) (Generic) 1 x Per Day/30 Days Discharge Instructions: Apply hydrogel as directed Prim Dressing: Promogran Prisma Matrix, 4.34 (sq in) (silver  collagen) (DME) (Generic) 1 x Per Day/30 Days ary Discharge Instructions: Moisten collagen with saline or hydrogel Secondary Dressing: T Non-Adherent Dressing, 3x4 in (DME) (Generic) 1 x Per Day/30 Days elfa Discharge Instructions: Apply over primary dressing as directed. Secured With: [employer], 4.5x3.1 (in/yd) (DME) (Generic) 1 x Per Day/30 Days Discharge Instructions: Secure with Kerlix as directed. Secured With: 78M Medipore Public affairs consultant Surgical T 2x10 (in/yd) (DME) (Generic) 1 x Per Day/30 Days ape Discharge Instructions: Secure with tape as directed. Wound #2 - Lower Leg Wound Laterality: Left, Lateral Cleanser: Soap and Water 1 x Per Day/30 Days Discharge Instructions: May shower and wash wound with dial antibacterial soap and water prior to dressing change. Cleanser: Wound Cleanser (DME) (Generic) 1 x Per Day/30 Days Discharge Instructions: Cleanse the wound with wound cleanser prior to applying a clean dressing using gauze sponges, not tissue or cotton balls. Topical: Skintegrity Hydrogel 4 (oz) (DME) (Generic) 1 x Per Day/30 Days Discharge Instructions: Apply hydrogel as directed Prim Dressing: Promogran Prisma Matrix, 4.34 (sq in) (silver collagen) (DME) (Generic) 1 x Per Day/30  Days ary Discharge Instructions: Moisten collagen with saline or hydrogel Secondary Dressing: T Non-Adherent Dressing, 3x4 in (DME) (Generic) 1 x Per Day/30 Days elfa Discharge Instructions: Apply over primary dressing as directed. Secured With: [employer], 4.5x3.1 (in/yd) (DME) (Generic) 1 x Per Day/30 Days Discharge Instructions: Secure with Kerlix as directed. Secured With: 74M [employer] Public affairs consultant Surgical T 2x10 (in/yd) (DME) (Generic) 1 x Per Day/30 Days ape Discharge Instructions: Secure with tape as directed. Wound #3 - Metatarsal head first Wound Laterality: Left Cleanser: Soap and Water 1 x Per Day/30 Days Discharge Instructions: May shower and wash wound with dial  antibacterial soap and water prior to dressing change. Cleanser: Wound Cleanser (DME) (Generic) 1 x Per Day/30 Days Discharge Instructions: Cleanse the wound with wound cleanser prior to applying a clean dressing using gauze sponges, not tissue or cotton balls. Topical: Skintegrity Hydrogel 4 (oz) (DME) (Generic) 1 x Per Day/30 Days Discharge Instructions: Apply hydrogel as directed Prim Dressing: Promogran Prisma Matrix, 4.34 (sq in) (silver collagen) (DME) (Generic) 1 x Per Day/30 Days ary Discharge Instructions: Moisten collagen with saline or hydrogel Secondary Dressing: T Non-Adherent Dressing, 3x4 in (DME) (Generic) 1 x Per Day/30 Days elfa Discharge Instructions: Apply over primary dressing as directed. Secured With: [employer], 4.5x3.1 (in/yd) (DME) (Generic) 1 x Per Day/30 Days Discharge Instructions: Secure with Kerlix as directed. Secured With: 74M Medipore Public affairs consultant Surgical T 2x10 (in/yd) (DME) (Generic) 1 x Per Day/30 Days ape Discharge Instructions: Secure with tape as directed. 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) Electronic Signature(s) Signed: 10/14/2022 5:57:22 PM By: Dellie Catholic RN Signed: 10/15/2022 7:41:18 AM By: Fredirick Maudlin [name] Previous Signature: 10/14/2022 12:45:53 PM Version By: Fredirick Maudlin [name] Entered By: Dellie Catholic on 10/14/2022 17:40:31 Prescription 10/14/2022 -------------------------------------------------------------------------------- [name] Patient Name: Provider: Mar 19, 1966 [id number] Date of Birth: NPI#: Jerilynn Mages [id number] Sex: DEA #: [phone] Phone #: License #: [place] Patient Address: [address] [place], [place] [address] [place], [place] [address] [phone] Allergies vancomycin; codeine Provider's Orders Plastic Surgery, Left anterior Lower Leg - ICD10: A07.622 - +++URGENT++++Tendon exposed, please evaluate for tissue coverage Hand Signature: Date(s): Electronic Signature(s) Signed: 10/14/2022 5:57:22 PM By: Dellie Catholic RN Signed: 10/15/2022 7:41:18 AM By: Fredirick Maudlin [name] Previous Signature: 10/14/2022 12:45:53 PM Version By: Fredirick Maudlin [name] Entered By: Dellie Catholic on 10/14/2022 17:40:31 -------------------------------------------------------------------------------- Problem List Details Patient Name: Date of Service: [name], [name] 10/14/2022 8:00 A M Medical Record Number: [id number] Patient Account Number: [id number] Date of Birth/Sex: Treating RN: [birth year] (56 y.o. 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 10/14/2022 No Yes Herman, [name] ([id number]) [id number] Page 7 of 12 [phone] Non-pressure chronic ulcer of other part of left lower leg with fat layer 10/14/2022 No Yes exposed L97.522 Non-pressure chronic ulcer of other part of left foot with fat layer exposed 10/14/2022 No Yes G50.03 Chronic diastolic (congestive) heart failure 10/14/2022 No Yes N18.6 End stage renal disease 10/14/2022 No Yes I82.4Z2 Acute embolism and thrombosis of unspecified deep  veins of left distal lower 10/14/2022 No Yes extremity Inactive Problems Resolved Problems Electronic Signature(s) Signed: 10/14/2022 9:21:51 AM By: Fredirick Maudlin [name] Entered By: Fredirick Maudlin on 10/14/2022  09:21:51 -------------------------------------------------------------------------------- Progress Note Details Patient Name: Date of Service: [name], [name] 10/14/2022 8:00 A M Medical Record Number: [id number] Patient Account Number: [id number] Date of Birth/Sex: Treating RN: Mar 05, 1966 (56 y.o. M) Primary Care Provider: Dartha Lodge, [facility] Other Clinician: Referring Provider: Treating Provider/Extender: [name], [name] Subjective Chief Complaint Information obtained from Patient Patient seen for complaints of Non-Healing Wounds. History of Present Illness (HPI) ADMISSION 10/14/2022 This is a 56 year old man with end-stage renal disease on hemodialysis status post kidney transplant which has subsequently failed. He has congestive heart failure and paroxysmal atrial fibrillation. He presented to the emergency department in mid October with left leg swelling. He was diagnosed with an acute DVT and subsequently anticoagulated. A few days later, he was seen in the emergency room with increased warmth, redness, and pain in his leg. He also had developed a blister on his dorsal ankle. He was given a course of antibiotics to treat cellulitis. Over the next few weeks, the tissue began to breakdown and he has developed a large ulceration on his dorsal foot and ankle, as well as his lateral leg. He also had a callus on his left first metatarsal head that has broken down and he has a small ulcer there. He is not diabetic. He had a recent aortogram performed that demonstrated vascular calcification, but all of his arteries were patent and he had outflow all the way into his distal foot. No changes were seen in the bone to suggest osteomyelitis. On his dorsal foot and ankle, there is a thick black eschar overlying a layer of slough with tendon exposure and necrosis of muscle. On his first metatarsal head, there is a callus with a tiny linear opening into the fat  layer. On his lateral calf, there is a third wound that is smaller, circular, and also exposes the fat layer. There is no malodor or purulent drainage from any of the wound sites. Patient History Information obtained from Patient. Allergies vancomycin (Severity: Severe), codeine (Severity: Severe) Critzer, [name] ([id number]) [id number] Page 8 of 12 Family History Unknown History. Social History Never smoker, Marital Status - Married, Alcohol Use - Never, Drug Use - No History, Caffeine Use - Daily. Medical History Cardiovascular Patient has history of Arrhythmia - A.Fib., Congestive Heart Failure, Coronary Artery Disease, Hypertension Genitourinary Patient has history of End Stage Renal Disease Hospitalization/Surgery History - Hemostasis Control (2022);Colonoscopy:2005 Renal Transplant (R). Medical A Surgical History Notes nd Cardiovascular Hx: DVT Genitourinary Hx: kidney Transplant. On Dialysis M,W,F Review of Systems (ROS) Integumentary (Skin) Complains or has symptoms of Wounds - Left Lower leg-Venous ulcer. Objective Constitutional Hypotensive but asymptomatic; this is apparently normal for this patient. No acute distress. Vitals Time Taken: 8:05 AM, Height: 73 in, Weight: 215 lbs, BMI: 28.4, Temperature: 97.8 F, Pulse: 78 bpm, Respiratory Rate: 18 breaths/min, Blood Pressure: 99/70 mmHg. Respiratory Normal work of breathing on room air.. General Notes: 10/14/2022: On his dorsal foot and ankle, there is a thick black eschar overlying a layer of slough with tendon exposure and necrosis of muscle. On his first metatarsal head, there is a callus with a tiny linear opening into the fat layer. On his lateral calf, there is a third wound that is smaller, circular, and also exposes the fat layer. There is no malodor or  purulent drainage from any of the wound sites. Integumentary (Hair, Skin) Wound #1 status is Open. Original cause of wound was  Gradually Appeared. The date acquired was: 09/04/2022. The wound is located on the Left,Anterior Lower Leg. The wound measures 11cm length x 9cm width x 0.1cm depth; 77.754cm^2 area and 7.775cm^3 volume. There is tendon and Fat Layer (Subcutaneous Tissue) exposed. There is no tunneling or undermining noted. There is a large amount of serosanguineous drainage noted. There is medium (34- 66%) pink granulation within the wound bed. There is a medium (34-66%) amount of necrotic tissue within the wound bed including Eschar and Adherent Slough. The periwound skin appearance exhibited: Dry/Scaly, Hemosiderin Staining. The periwound skin appearance did not exhibit: Callus. Periwound temperature was noted as No Abnormality. Wound #2 status is Open. Original cause of wound was Gradually Appeared. The date acquired was: 09/29/2022. The wound is located on the Left,Lateral Lower Leg. The wound measures 1.1cm length x 1.2cm width x 0.1cm depth; 1.037cm^2 area and 0.104cm^3 volume. There is Fat Layer (Subcutaneous Tissue) exposed. There is no tunneling or undermining noted. There is a medium amount of serosanguineous drainage noted. There is medium (34-66%) red granulation within the wound bed. There is a medium (34-66%) amount of necrotic tissue within the wound bed including Eschar and Adherent Slough. The periwound skin appearance had no abnormalities noted for texture. The periwound skin appearance did not exhibit: Dry/Scaly, Maceration, Atrophie Blanche, Cyanosis, Ecchymosis, Hemosiderin Staining, Mottled, Pallor, Rubor, Erythema. Periwound temperature was noted as No Abnormality. Wound #3 status is Open. Original cause of wound was Gradually Appeared. The date acquired was: 09/29/2022. The wound is located on the Left Metatarsal head first. The wound measures 0.1cm length x 0.4cm width x 0.1cm depth; 0.031cm^2 area and 0.003cm^3 volume. There is Fat Layer (Subcutaneous Tissue) exposed. There is no tunneling or  undermining noted. There is a medium amount of serosanguineous drainage noted. There is large (67-100%) red granulation within the wound bed. There is a small (1-33%) amount of necrotic tissue within the wound bed including Eschar and Adherent Slough. The periwound skin appearance had no abnormalities noted for texture. The periwound skin appearance had no abnormalities noted for color. The periwound skin appearance exhibited: Dry/Scaly. Periwound temperature was noted as No Abnormality. 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 There was a Excisional Skin/Subcutaneous Tissue/Muscle Debridement with a total area of 99 sq cm performed by Fredirick Maudlin, [name]. With the following instrument(s): Blade, Forceps, and Scissors to remove Non-Viable tissue/material. Material removed includes Muscle, Eschar, Subcutaneous Tissue, and Slough after achieving pain control using Lidocaine 4% Topical Solution. No specimens were taken. A time out was conducted at 09:03, prior to the start of the procedure. A Minimum amount of bleeding was controlled with Pressure. The procedure was tolerated well with a pain level of 0 throughout and a pain level of 0 following the procedure. Post Debridement Measurements: 11cm length x 9cm width x 0.1cm depth; 7.775cm^3 volume. Character of  Wound/Ulcer Post Debridement is improved. Severity of Tissue Post Debridement is: Fat layer exposed. Post procedure Diagnosis Wound #1: Same as Pre-Procedure General Notes: Scribed for Dr. Celine Ahr by J.Scotton. Wound #2 Pre-procedure diagnosis of Wound #2 is an Abrasion located on the Left,Lateral Lower Leg . There was a Selective/Open Wound Non-Viable Tissue Debridement with a total area of 1.32 sq cm performed by Fredirick Maudlin, [name]. With the following instrument(s): Blade, Forceps, and Scissors to remove Non-Viable tissue/material. Material removed includes [facility] after achieving pain control using Lidocaine 4% Topical Solution. No specimens were taken. A time out was conducted at 09:03, prior to the start of the procedure. A Minimum amount of bleeding was controlled with Pressure. The procedure was tolerated well with a pain level of 0 throughout and a pain level of 0 following the procedure. Post Debridement Measurements: 1.1cm length x 1.2cm width x 0.1cm depth; 0.104cm^3 volume. Character of Wound/Ulcer Post Debridement is improved. Post procedure Diagnosis Wound #2: Same as Pre-Procedure General Notes: Scribed for Dr. Celine Ahr by J.Scotton. Wound #3 Pre-procedure diagnosis of Wound #3 is a Lesion located on the Left Metatarsal head first . There was a Selective/Open Wound Non-Viable Tissue Debridement with a total area of 0.04 sq cm performed by Fredirick Maudlin, [name]. With the following instrument(s): Blade, Forceps, and Scissors to remove Non-Viable tissue/material. Material removed includes Callus and Slough and after achieving pain control using Lidocaine 4% Topical Solution. No specimens were taken. A time out was conducted at 09:03, prior to the start of the procedure. A Minimum amount of bleeding was controlled with Pressure. The procedure was tolerated well with a pain level of 0 throughout and a pain level of 0 following the procedure. Post Debridement Measurements: 0.1cm length x  0.4cm width x 0.1cm depth; 0.003cm^3 volume. Character of Wound/Ulcer Post Debridement is improved. Post procedure Diagnosis Wound #3: Same as Pre-Procedure General Notes: Scribed for Dr. Celine Ahr by J.Scotton. Plan Follow-up Appointments: Return Appointment in 1 week. - Dr. Celine Ahr Room 3 Anesthetic: (In clinic) Topical Lidocaine 4% applied to wound bed - Used in Clinic Bathing/ Shower/ Hygiene: May shower with protection but do not get wound dressing(s) wet. - Consults ordered were: Plastic Surgery, Left anterior Lower Leg - +++URGENT++++T endon exposed, please evaluate for tissue coverage WOUND #1: - Lower Leg Wound Laterality: Left, Anterior Cleanser: Soap and Water 1 x Per Day/30 Days Discharge Instructions: May shower and wash wound with dial antibacterial soap and water prior to dressing change. Cleanser: Wound Cleanser 1 x Per Day/30 Days Discharge Instructions: Cleanse the wound with wound cleanser prior to applying a clean dressing using gauze sponges, not tissue or cotton balls. Topical: Skintegrity Hydrogel 4 (oz) 1 x Per Day/30 Days Discharge Instructions: Apply hydrogel as directed Prim Dressing: Promogran Prisma Matrix, 4.34 (sq in) (silver collagen) 1 x Per Day/30 Days ary Discharge Instructions: Moisten collagen with saline or hydrogel Secondary Dressing: T Non-Adherent Dressing, 3x4 in 1 x Per Day/30 Days elfa Discharge Instructions: Apply over primary dressing as directed. Secured With: [employer], 4.5x3.1 (in/yd) 1 x Per Day/30 Days Discharge Instructions: Secure with Kerlix as directed. Secured With: 23M Med[employer] Public affairs consultant Surgical T 2x10 (in/yd) 1 x Per Day/30 Days ape Discharge Instructions: Secure with tape as directed. WOUND #2: - Lower Leg Wound Laterality: Left, Lateral Cleanser: Soap and Water 1 x Per Day/30 Days Discharge Instructions: May shower and wash wound with dial antibacterial soap and water prior to dressing change. Cleanser: Wound  Cleanser 1 x Per Day/30  Days Discharge Instructions: Cleanse the wound with wound cleanser prior to applying a clean dressing using gauze sponges, not tissue or cotton balls. Topical: Skintegrity Hydrogel 4 (oz) 1 x Per Day/30 Days Discharge Instructions: Apply hydrogel as directed Prim Dressing: Promogran Prisma Matrix, 4.34 (sq in) (silver collagen) 1 x Per Day/30 Days ary Discharge Instructions: Moisten collagen with saline or hydrogel Secondary Dressing: T Non-Adherent Dressing, 3x4 in 1 x Per Day/30 Days elfa Discharge Instructions: Apply over primary dressing as directed. Secured With: [employer], 4.5x3.1 (in/yd) 1 x Per Day/30 Days Discharge Instructions: Secure with Kerlix as directed. Secured With: 48M [employer] Public affairs consultant Surgical T 2x10 (in/yd) 1 x Per Day/30 Days ape Discharge Instructions: Secure with tape as directed. WOUND #3: - Metatarsal head first Wound Laterality: Left Cleanser: Soap and Water 1 x Per Day/30 Days Discharge Instructions: May shower and wash wound with dial antibacterial soap and water prior to dressing change. Cleanser: Wound Cleanser 1 x Per Day/30 Days Discharge Instructions: Cleanse the wound with wound cleanser prior to applying a clean dressing using gauze sponges, not tissue or cotton balls. Topical: Skintegrity Hydrogel 4 (oz) 1 x Per Day/30 Days Discharge Instructions: Apply hydrogel as directed Prim Dressing: Promogran Prisma Matrix, 4.34 (sq in) (silver collagen) 1 x Per Day/30 Days ary Discharge Instructions: Moisten collagen with saline or hydrogel Secondary Dressing: T Non-Adherent Dressing, 3x4 in 1 x Per Day/30 Days elfa Discharge Instructions: Apply over primary dressing as directed. Secured With: [employer], 4.5x3.1 (in/yd) 1 x Per Day/30 Days Simkins, [name] ([id number]) [id number] Page 10 of 12 Discharge Instructions: Secure with Kerlix as directed. Secured With: 48M [employer] Public affairs consultant  Surgical T 2x10 (in/yd) 1 x Per Day/30 Days ape Discharge Instructions: Secure with tape as directed. 10/14/2022: This is a 56 year old man who had an acute DVT in mid October. Subsequent leg swelling and blistering has led to significant tissue breakdown. An aortogram with outflows shows perfusion all the way to his distal foot. On his dorsal foot and ankle, there is a thick black eschar overlying a layer of slough with tendon exposure and necrosis of muscle. On his first metatarsal head, there is a callus with a tiny linear opening into the fat layer. On his lateral calf, there is a third wound that is smaller, circular, and also exposes the fat layer. There is no malodor or purulent drainage from any of the wound sites. I used a combination of forceps, scissors, and a scalpel to remove the thick black eschar from the dorsal foot and ankle wound. I then used a curette to debride slough and nonviable subcutaneous tissue and muscle from that wound. I debrided slough and eschar from the lateral leg wound and callus and slough from the first metatarsal head wound. He is going to need tissue coverage for the exposed tendon. I have placed an urgent referral to plastic surgery for this and also messaged Dr. Tedra Coupe him directly. We will use Prisma silver collagen and Xeroform to maintain moisture in this wound. Prisma silver collagen to the other wound sites, as well. Follow-up in 1 week. Electronic Signature(s) Signed: 10/14/2022 10:42:18 AM By: Fredirick Maudlin [name] Previous Signature: 10/14/2022 10:40:33 AM Version By: Fredirick Maudlin [name] Entered By: Fredirick Maudlin on 10/14/2022 10:42:17 -------------------------------------------------------------------------------- HxROS Details Patient Name: Date of Service: [name], [name] 10/14/2022 8:00 A M Medical Record Number: [id number] Patient Account Number: [id number] Date of Birth/Sex: Treating RN: 09-02-1966 (56 y.o. 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 M,W,F Immunizations Pneumococcal Vaccine: Received Pneumococcal Vaccination: No Implantable Devices No devices added Hospitalization / Surgery History Type of Hospitalization/Surgery Hemostasis Control (2022);Colonoscopy:2005 Renal Transplant (R) Family and Social History Unknown History: Yes; Never smoker; Marital Status - Married; Alcohol Use: Never; Drug Use: No History; Caffeine Use: Daily; Financial Concerns: No; Food, Clothing or Shelter Needs: No; Support System Lacking: No; Transportation Concerns: No [place], [place] ([id number]) [id number] Page 11 of 12 Electronic Signature(s) Signed: 10/14/2022 12:45:53 PM By: Fredirick Maudlin [name] Signed: 10/14/2022 5:57:22 PM By: Dellie Catholic RN Entered By: Dellie Catholic on 10/14/2022 08:17:29 -------------------------------------------------------------------------------- SuperBill Details Patient Name: Date of Service: [name], [name] 10/14/2022 Medical Record Number: [id number] Patient Account Number: [id number] Date of Birth/Sex: Treating RN: 05/30/1966 (56 y.o. 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 L97.822 Non-pressure chronic ulcer of other part of left lower leg with fat layer exposed L97.522 Non-pressure chronic ulcer of other part of left foot with fat layer exposed P37.90 Chronic diastolic (congestive) heart failure N18.6 End stage renal disease I82.4Z2 Acute embolism and thrombosis of unspecified deep veins of left distal lower extremity Facility Procedures : CPT4 Code: 24097353 Description: 99213 - WOUND CARE VISIT-LEV 3 EST PT Modifier: Quantity: 1 : CPT4 Code: 29924268 Description: 11043 - DEB MUSC/FASCIA 20 SQ CM/< ICD-10 Diagnosis Description L97.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 Modifier: Quantity: 1 : CPT4 Code: 34196222 Description: 11046 - DEB MUSC/FASCIA EA ADDL 20 CM ICD-10 Diagnosis Description L97.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 Modifier: Quantity: 4 : CPT4 Code: [id number] Description: 94174 - DEBRIDE WOUND 1ST 20 SQ CM OR < ICD-10 Diagnosis Description L97.822 Non-pressure chronic ulcer of other part of left lower leg with fat layer expose L97.522 Non-pressure chronic ulcer of other part of left foot with fat layer  exposed Modifier: d Quantity: 1 Physician Procedures : CPT4 Code Description Modifier [id number] 99204 - WC PHYS LEVEL 4 - NEW PT 25 ICD-10 Diagnosis Description L97.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 L97.822 Non-pressure chronic ulcer of other part of left lower leg with fat  layer exposed L97.522 Non-pressure chronic ulcer of other part of left foot with fat layer exposed I82.4Z2 Acute embolism and thrombosis of unspecified deep veins of left distal lower extremity Quantity: 1 : [id number] 11043 - WC PHYS DEBR MUSCLE/FASCIA 20 SQ CM ICD-10 Diagnosis Description L97.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 Quantity: 1 : [id number] 11046 - WC PHYS DEB MUSC/FASC EA ADDL 20 CM ICD-10  Diagnosis Description L97.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 Fults, [name] ([id number]) [id number] - WC PHYS DEBR WO ANESTH  20 SQ CM ICD-10 Diagnosis Description L97.822 Non-pressure chronic ulcer of other part of left lower leg with fat layer exposed L97.522 Non-pressure chronic ulcer of other part of left  foot with fat layer exposed Quantity: 4 1227.pdf Page 12 of 12 1 Electronic Signature(s) Signed: 10/14/2022 5:57:22 PM By: Dellie Catholic RN Signed: 10/15/2022 7:41:18 AM By: Fredirick Maudlin [name] Previous Signature: 10/14/2022 10:42:46 AM Version By: Fredirick Maudlin [name] Entered By: Dellie Catholic on 10/14/2022 17:42:41

## 2022-10-20 ENCOUNTER — Encounter: Payer: Self-pay | Admitting: Plastic Surgery

## 2022-10-20 ENCOUNTER — Encounter (HOSPITAL_COMMUNITY): Payer: Self-pay | Admitting: Plastic Surgery

## 2022-10-20 ENCOUNTER — Ambulatory Visit: Payer: Medicare Other | Admitting: Plastic Surgery

## 2022-10-20 VITALS — BP 95/60 | HR 114 | Ht 73.0 in | Wt 209.6 lb

## 2022-10-20 DIAGNOSIS — I4891 Unspecified atrial fibrillation: Secondary | ICD-10-CM

## 2022-10-20 DIAGNOSIS — I824Z2 Acute embolism and thrombosis of unspecified deep veins of left distal lower extremity: Secondary | ICD-10-CM

## 2022-10-20 DIAGNOSIS — S86902A Unspecified injury of unspecified muscle(s) and tendon(s) at lower leg level, left leg, initial encounter: Secondary | ICD-10-CM

## 2022-10-20 DIAGNOSIS — Z86718 Personal history of other venous thrombosis and embolism: Secondary | ICD-10-CM | POA: Diagnosis not present

## 2022-10-20 DIAGNOSIS — S91002A Unspecified open wound, left ankle, initial encounter: Secondary | ICD-10-CM

## 2022-10-20 NOTE — H&P (View-Only) (Signed)
[name], male   DOB: [birth year], 56 y.o.   MRN: [id number]  Chief Complaint  Patient presents with   Advice Only    HPI [name] is a 56 y.o. male.  Patient presents to the clinic with an wound to the anterior aspect of the left ankle measuring 8 x 10 cm with exposed tendon in the central portion of the wound.  He states that the wound began to appear after he developed a blood clot in his leg.  He has been managed by the wound care team however the wound is continued to progress and now has exposed tendon.  He will require operative debridement and placement of a tissue matrix to cover the tendon. HPI  Past Medical History:  Diagnosis Date   Atrial fibrillation (HCC)    CHF (congestive heart failure) (HCC)    Chronic kidney disease    Coronary artery disease    DVT (deep venous thrombosis) ([place]) 08/30/2022   left leg   Dysrhythmia    A-fib   H/O heart artery stent    HLD (hyperlipidemia)    Hypertension    Kidney transplanted    Pneumonia    Sleep apnea    CPAP    Past Surgical History:  Procedure Laterality Date   ABLATION     CHOLECYSTECTOMY     COLONOSCOPY WITH PROPOFOL N/A 12/09/2020   non-bleeding internal hemorrhoids, sigmoid and descending colon diverticulosis, stool in entire examined colon.    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   TRANSPLANTATION RENAL  2005    Family History   Problem Relation Age of Onset   Hypertension Mother    Heart attack Father    Hypertension Maternal Grandmother    Stroke Maternal Grandfather    Heart attack Paternal Uncle    Heart attack Paternal Uncle    Heart attack Paternal Aunt    Stroke Maternal Uncle     Social History Social History   Tobacco Use   Smoking status: Never   Smokeless tobacco: Never  Substance Use Topics   Alcohol use: No   Drug use: No    Allergies  Allergen Reactions   Codeine     Unknown reaction   Vancomycin Swelling    Current Outpatient Medications  Medication Sig Dispense Refill   acetaminophen (TYLENOL) 325 MG tablet Take 2 tablets (650 mg total) by mouth every 6 (six) hours as needed for mild pain, fever or headache.     allopurinol (ZYLOPRIM) 100 MG tablet Take 100 mg by mouth daily.     apixaban (ELIQUIS) 5 MG TABS tablet Take 2 tablet by mouth twice a day x6 days; then 1 tablet by mouth twice a day. 60 tablet 3   Ascorbic Acid (VITAMIN C PO) Take 1 tablet by mouth daily.     aspirin 81 MG EC tablet Take 81 mg by mouth daily.     atorvastatin (LIPITOR)  80 MG tablet Take 80 mg by mouth daily.     AURYXIA 1 GM 210 MG(Fe) tablet Take 420 mg by mouth 3 (three) times daily. Take with each meal and snack     calcium carbonate (TUMS - DOSED IN MG ELEMENTAL CALCIUM) 500 MG chewable tablet Chew 1 tablet by mouth 2 (two) times daily with a meal.     Cholecalciferol (VITAMIN D) 50 MCG (2000 UT) tablet Take 2,000 Units by mouth daily.     cyclobenzaprine (FLEXERIL) 10 MG tablet Take 1 tablet by mouth 2 (two) times daily as needed.     doxycycline (VIBRAMYCIN) 100 MG capsule Take 1 capsule (100 mg total) by mouth 2 (two) times daily. One po bid x 7 days 14 capsule 0   midodrine (PROAMATINE) 5 MG tablet Take 3 tablets (15 mg total) by mouth 3 (three) times daily with meals. 180 tablet 1   Multiple Vitamins-Minerals (MULTIVITAMIN WITH MINERALS) tablet Take 1 tablet by mouth daily.     oxyCODONE (OXY  IR/ROXICODONE) 5 MG immediate release tablet Take 1 tablet (5 mg total) by mouth every 6 (six) hours as needed for severe pain. 30 tablet 0   pantoprazole (PROTONIX) 40 MG tablet Take 1 tablet (40 mg total) by mouth 2 (two) times daily. (Patient taking differently: Take 40 mg by mouth daily.) 60 tablet 1   predniSONE (DELTASONE) 10 MG tablet Take 10 mg by mouth daily with breakfast.     tacrolimus (PROGRAF) 0.5 MG capsule Take 0.5 mg by mouth daily.     tamsulosin (FLOMAX) 0.4 MG CAPS capsule Take 0.4 mg by mouth daily.      No current facility-administered medications for this visit.    Review of Systems Review of Systems the patient has a history of chronic renal disease and is currently on dialysis.  His last dialysis was the morning of October 20, 2022.  He also has a history of atrial fibrillation is treated with Eliquis, amlodipine and metoprolol.  He has a he of stent placement.  Blood pressure 95/60, pulse (!) 114, height 6' 1" (1.854 m), weight 209 lb 9.6 oz (95.1 kg), SpO2 100 %.  Physical Exam Physical Exam General: The patient is awake alert and appropriate. Extremities: All extremities are intact.  Is have weakness of the right hand.  Also has a wound on the anterior surface of the left ankle as noted.  Data Reviewed Pertinent data has been reviewed.  Patient's last A1c was 5.6.  The last potassium in his chart was 4.6 but this was on September 04, 2022  Assessment Open wound with exposed tendon anterior surface left ankle.  Plan Will take the patient to the operating room tomorrow afternoon for operative debridement of the wound and placement of [employer] tissue matrix.  Discussed the procedure with the patient and his wife.  They understand and agree.  I discussed the dressing changes postoperatively. I anticipate minimal blood loss and minimal pain from the procedure.    [name] 10/20/2022, 4:44 PM   No diagnosis found.   History of Present Illness: [name] is a 56 y.o.  male  with a history of DVT with a subsequent open wound on the anterior surface of the left ankle and exposed tendon.  He presents for preoperative evaluation for upcoming procedure, wound debridement and placement of myriad, scheduled for December 7 with Dr. Lovena Le.  Past Medical History: Allergies: Allergies  Allergen Reactions   Codeine     Unknown  reaction   Vancomycin Swelling    Current Medications:  Current Outpatient Medications:    acetaminophen (TYLENOL) 325 MG tablet, Take 2 tablets (650 mg total) by mouth every 6 (six) hours as needed for mild pain, fever or headache., Disp: , Rfl:    allopurinol (ZYLOPRIM) 100 MG tablet, Take 100 mg by mouth daily., Disp: , Rfl:    apixaban (ELIQUIS) 5 MG TABS tablet, Take 2 tablet by mouth twice a day x6 days; then 1 tablet by mouth twice a day., Disp: 60 tablet, Rfl: 3   Ascorbic Acid (VITAMIN C PO), Take 1 tablet by mouth daily., Disp: , Rfl:    aspirin 81 MG EC tablet, Take 81 mg by mouth daily., Disp: , Rfl:    atorvastatin (LIPITOR) 80 MG tablet, Take 80 mg by mouth daily., Disp: , Rfl:    AURYXIA 1 GM 210 MG(Fe) tablet, Take 420 mg by mouth 3 (three) times daily. Take with each meal and snack, Disp: , Rfl:    calcium carbonate (TUMS - DOSED IN MG ELEMENTAL CALCIUM) 500 MG chewable tablet, Chew 1 tablet by mouth 2 (two) times daily with a meal., Disp: , Rfl:    Cholecalciferol (VITAMIN D) 50 MCG (2000 UT) tablet, Take 2,000 Units by mouth daily., Disp: , Rfl:    cyclobenzaprine (FLEXERIL) 10 MG tablet, Take 1 tablet by mouth 2 (two) times daily as needed., Disp: , Rfl:    doxycycline (VIBRAMYCIN) 100 MG capsule, Take 1 capsule (100 mg total) by mouth 2 (two) times daily. One po bid x 7 days, Disp: 14 capsule, Rfl: 0   midodrine (PROAMATINE) 5 MG tablet, Take 3 tablets (15 mg total) by mouth 3 (three) times daily with meals., Disp: 180 tablet, Rfl: 1   Multiple Vitamins-Minerals (MULTIVITAMIN WITH MINERALS) tablet,  Take 1 tablet by mouth daily., Disp: , Rfl:    oxyCODONE (OXY IR/ROXICODONE) 5 MG immediate release tablet, Take 1 tablet (5 mg total) by mouth every 6 (six) hours as needed for severe pain., Disp: 30 tablet, Rfl: 0   pantoprazole (PROTONIX) 40 MG tablet, Take 1 tablet (40 mg total) by mouth 2 (two) times daily. (Patient taking differently: Take 40 mg by mouth daily.), Disp: 60 tablet, Rfl: 1   predniSONE (DELTASONE) 10 MG tablet, Take 10 mg by mouth daily with breakfast., Disp: , Rfl:    tacrolimus (PROGRAF) 0.5 MG capsule, Take 0.5 mg by mouth daily., Disp: , Rfl:    tamsulosin (FLOMAX) 0.4 MG CAPS capsule, Take 0.4 mg by mouth daily. , Disp: , Rfl:   Past Medical Problems: Past Medical History:  Diagnosis Date   Atrial fibrillation (HCC)    CHF (congestive heart failure) (HCC)    Chronic kidney disease    Coronary artery disease    DVT (deep venous thrombosis) (HCC) 08/30/2022   left leg   Dysrhythmia    A-fib   H/O heart artery stent    HLD (hyperlipidemia)    Hypertension    Kidney transplanted    Pneumonia    Sleep apnea    CPAP    Past Surgical History: Past Surgical History:  Procedure Laterality Date   ABLATION     CHOLECYSTECTOMY     COLONOSCOPY WITH PROPOFOL N/A 12/09/2020   non-bleeding internal hemorrhoids, sigmoid and descending colon diverticulosis, stool in entire examined colon.    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   TRANSPLANTATION RENAL  2005    The patient has had anesthesia or sedation in the past.   The patient has not had problems with anesthesia.  The patient does not have a family history of anesthesia problems.  No anesthesia problems have been noted in the past.  Social History: Social History   Socioeconomic History   Marital status: Married    Spouse name: Not on file   Number of children: 4   Years of education: Not on file   Highest education level: Not on file  Occupational History   Occupation: partial disabled   Occupation: host  Tobacco Use   Smoking status: Never   Smokeless tobacco: Never  Substance and Sexual Activity   Alcohol use: No   Drug use: No   Sexual activity: Not on file  Other Topics Concern   Not on file  Social History Narrative   Not on file   Social Determinants of Health   Financial Resource Strain: Not on file  Food Insecurity: No Food Insecurity (08/27/2022)   Hunger Vital Sign    Worried About Running Out of Food in the Last Year: Never true    Ran Out of Food in the Last Year: Never true  Transportation Needs: No Transportation Needs (08/27/2022)   PRAPARE - Transportation    Lack of Transportation (Medical): No    Lack of Transportation (Non-Medical): No  Physical Activity: Not on file  Stress: Not on file  Social Connections: Not on file  Intimate Partner Violence: Not At Risk (08/27/2022)   Humiliation, Afraid, Rape, and Kick questionnaire    Fear of Current or Ex-Partner: No    Emotionally Abused: No    Physically Abused: No    Sexually Abused: No    Family History: Family History  Problem Relation Age of Onset   Hypertension Mother    Heart attack Father    Hypertension Maternal Grandmother    Stroke Maternal Grandfather    Heart attack Paternal Uncle    Heart attack Paternal Uncle    Heart attack Paternal Aunt    Stroke Maternal Uncle     Review of Systems: General ROS: Patient is on dialysis for  chronic renal failure, he has atrial fibrillation for which she is treated with Eliquis amiodarone metoprolol, the DVT as noted before above, history of coronary artery disease.  Open wound on the anterior surface of the left ankle Dermatological ROS: Open wound on the anterior surface of the left ankle Cardiovascular ROS: Atrial fibrillation and stent placement ENT ROS: negative Gastrointestinal ROS: no abdominal pain, change in bowel habits, or black or bloody stools  Physical Exam: Vital Signs BP 95/60 (BP Location: Right Arm, Patient Position: Sitting, Cuff Size: Small)   Pulse (!) 114   Ht 6' 1" (1.854 m)   Wt 209 lb 9.6 oz (95.1 kg)   SpO2 100%   BMI 27.65 kg/m  General:alert and cooperative HEENT:negative Neck: normal Chest: Normal. Breast: normal appearance, no masses or tenderness, Inspection negative Cardiac: Atrial   fibrillation Abdomen: Not examined GU: Not examined Musculoskeletal: Extremities intact Neuro: Grossly intact Extremities: All extremities intact, a wound approximately 8 x 10 cm on the anterior aspect of the left ankle with the flexor tendons exposed. Skin: Wound is noted Assessment: 56 y.o.male  with a history of an open wound on the anterior surface of the left ankle  Plan: Mr. Dralle is scheduled for debridement of the open wound with placement of myriad with Dr. Taylor.  Risks, benefits, and alternatives of procedure discussed, questions answered and consent obtained.     Electronically signed by: Jackson B Taylor, MD 10/20/2022 3:57 PM            

## 2022-10-20 NOTE — Progress Notes (Signed)
Patient informed to call OR desk [phone] at 8 AM Thursday, 09/21/22 for arrival time for surgical procedure.   Add-On Case for 09/21/22.  Patient agreed to call OR desk for arrival time for surgery.  Chest x-ray - 09/04/22 (1V) EKG - 08/27/22 Stress Test - 10/28/20 ECHO Transthoracic  - 04/07/17 ECHO - 11/18/16 Cardiac Cath - 12/24/21  ICD Pacemaker/Loop - n/a  Sleep Study -  Yes BiPap - uses nightly  Blood Thinner Instructions:  Follow your surgeon's instructions on when to stop Eliquis  prior to surgery.   Anesthesia review: Yes, [place] Comp.  Will review on DOS per Dr Ola Spurr.  STOP now taking any Aspirin (unless otherwise instructed by your surgeon), Aleve, Naproxen, Ibuprofen, Motrin, Advil, Goody's, BC's, all herbal medications, fish oil, and all vitamins.   Coronavirus Screening Do you have any of the following symptoms:  Cough yes/no: No Fever (>100.37F)  yes/no: No Runny nose yes/no: No Sore throat yes/no: No Difficulty breathing/shortness of breath  yes/no: No  Have you traveled in the last 14 days and where? yes/no: No  Patient verbalized understanding of instructions that were given via phone.

## 2022-10-20 NOTE — Progress Notes (Addendum)
[name], male   DOB: 03/26/1966, 56 y.o.   MRN: [id number]  Chief Complaint  Patient presents with   Advice Only    HPI [name] is a 56 y.o. male.  Patient presents to the clinic with an wound to the anterior aspect of the left ankle measuring 8 x 10 cm with exposed tendon in the central portion of the wound.  He states that the wound began to appear after he developed a blood clot in his leg.  He has been managed by the wound care team however the wound is continued to progress and now has exposed tendon.  He will require operative debridement and placement of a tissue matrix to cover the tendon. HPI  Past Medical History:  Diagnosis Date   Atrial fibrillation (HCC)    CHF (congestive heart failure) (HCC)    Chronic kidney disease    Coronary artery disease    DVT (deep venous thrombosis) (HCC) 08/30/2022   left leg   Dysrhythmia    A-fib   H/O heart artery stent    HLD (hyperlipidemia)    Hypertension    Kidney transplanted    Pneumonia    Sleep apnea    CPAP    Past Surgical History:  Procedure Laterality Date   ABLATION     CHOLECYSTECTOMY     COLONOSCOPY WITH PROPOFOL N/A 12/09/2020   non-bleeding internal hemorrhoids, sigmoid and descending colon diverticulosis, stool in entire examined colon.    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   TRANSPLANTATION RENAL  2005    Family History   Problem Relation Age of Onset   Hypertension Mother    Heart attack Father    Hypertension Maternal Grandmother    Stroke Maternal Grandfather    Heart attack Paternal Uncle    Heart attack Paternal Uncle    Heart attack Paternal Aunt    Stroke Maternal Uncle     Social History Social History   Tobacco Use   Smoking status: Never   Smokeless tobacco: Never  Substance Use Topics   Alcohol use: No   Drug use: No    Allergies  Allergen Reactions   Codeine     Unknown reaction   Vancomycin Swelling    Current Outpatient Medications  Medication Sig Dispense Refill   acetaminophen (TYLENOL) 325 MG tablet Take 2 tablets (650 mg total) by mouth every 6 (six) hours as needed for mild pain, fever or headache.     allopurinol (ZYLOPRIM) 100 MG tablet Take 100 mg by mouth daily.     apixaban (ELIQUIS) 5 MG TABS tablet Take 2 tablet by mouth twice a day x6 days; then 1 tablet by mouth twice a day. 60 tablet 3   Ascorbic Acid (VITAMIN C PO) Take 1 tablet by mouth daily.     aspirin 81 MG EC tablet Take 81 mg by mouth daily.     atorvastatin (LIPITOR)   80 MG tablet Take 80 mg by mouth daily.     AURYXIA 1 GM 210 MG(Fe) tablet Take 420 mg by mouth 3 (three) times daily. Take with each meal and snack     calcium carbonate (TUMS - DOSED IN MG ELEMENTAL CALCIUM) 500 MG chewable tablet Chew 1 tablet by mouth 2 (two) times daily with a meal.     Cholecalciferol (VITAMIN D) 50 MCG (2000 UT) tablet Take 2,000 Units by mouth daily.     cyclobenzaprine (FLEXERIL) 10 MG tablet Take 1 tablet by mouth 2 (two) times daily as needed.     doxycycline (VIBRAMYCIN) 100 MG capsule Take 1 capsule (100 mg total) by mouth 2 (two) times daily. One po bid x 7 days 14 capsule 0   midodrine (PROAMATINE) 5 MG tablet Take 3 tablets (15 mg total) by mouth 3 (three) times daily with meals. 180 tablet 1   Multiple Vitamins-Minerals (MULTIVITAMIN WITH MINERALS) tablet Take 1 tablet by mouth daily.     oxyCODONE (OXY  IR/ROXICODONE) 5 MG immediate release tablet Take 1 tablet (5 mg total) by mouth every 6 (six) hours as needed for severe pain. 30 tablet 0   pantoprazole (PROTONIX) 40 MG tablet Take 1 tablet (40 mg total) by mouth 2 (two) times daily. (Patient taking differently: Take 40 mg by mouth daily.) 60 tablet 1   predniSONE (DELTASONE) 10 MG tablet Take 10 mg by mouth daily with breakfast.     tacrolimus (PROGRAF) 0.5 MG capsule Take 0.5 mg by mouth daily.     tamsulosin (FLOMAX) 0.4 MG CAPS capsule Take 0.4 mg by mouth daily.      No current facility-administered medications for this visit.    Review of Systems Review of Systems the patient has a history of chronic renal disease and is currently on dialysis.  His last dialysis was the morning of October 20, 2022.  He also has a history of atrial fibrillation is treated with Eliquis, amlodipine and metoprolol.  He has a he of stent placement.  Blood pressure 95/60, pulse (!) 114, height 6' 1" (1.854 m), weight 209 lb 9.6 oz (95.1 kg), SpO2 100 %.  Physical Exam Physical Exam General: The patient is awake alert and appropriate. Extremities: All extremities are intact.  Is have weakness of the right hand.  Also has a wound on the anterior surface of the left ankle as noted.  Data Reviewed Pertinent data has been reviewed.  Patient's last A1c was 5.6.  The last potassium in his chart was 4.6 but this was on September 04, 2022  Assessment Open wound with exposed tendon anterior surface left ankle.  Plan Will take the patient to the operating room tomorrow afternoon for operative debridement of the wound and placement of [employer] tissue matrix.  Discussed the procedure with the patient and his wife.  They understand and agree.  I discussed the dressing changes postoperatively. I anticipate minimal blood loss and minimal pain from the procedure.    [name] 10/20/2022, 4:44 PM   No diagnosis found.   History of Present Illness: [name] is a 56 y.o.  male  with a history of DVT with a subsequent open wound on the anterior surface of the left ankle and exposed tendon.  He presents for preoperative evaluation for upcoming procedure, wound debridement and placement of myriad, scheduled for December 7 with Dr. Lovena Le.  Past Medical History: Allergies: Allergies  Allergen Reactions   Codeine     Unknown  reaction   Vancomycin Swelling    Current Medications:  Current Outpatient Medications:    acetaminophen (TYLENOL) 325 MG tablet, Take 2 tablets (650 mg total) by mouth every 6 (six) hours as needed for mild pain, fever or headache., Disp: , Rfl:    allopurinol (ZYLOPRIM) 100 MG tablet, Take 100 mg by mouth daily., Disp: , Rfl:    apixaban (ELIQUIS) 5 MG TABS tablet, Take 2 tablet by mouth twice a day x6 days; then 1 tablet by mouth twice a day., Disp: 60 tablet, Rfl: 3   Ascorbic Acid (VITAMIN C PO), Take 1 tablet by mouth daily., Disp: , Rfl:    aspirin 81 MG EC tablet, Take 81 mg by mouth daily., Disp: , Rfl:    atorvastatin (LIPITOR) 80 MG tablet, Take 80 mg by mouth daily., Disp: , Rfl:    AURYXIA 1 GM 210 MG(Fe) tablet, Take 420 mg by mouth 3 (three) times daily. Take with each meal and snack, Disp: , Rfl:    calcium carbonate (TUMS - DOSED IN MG ELEMENTAL CALCIUM) 500 MG chewable tablet, Chew 1 tablet by mouth 2 (two) times daily with a meal., Disp: , Rfl:    Cholecalciferol (VITAMIN D) 50 MCG (2000 UT) tablet, Take 2,000 Units by mouth daily., Disp: , Rfl:    cyclobenzaprine (FLEXERIL) 10 MG tablet, Take 1 tablet by mouth 2 (two) times daily as needed., Disp: , Rfl:    doxycycline (VIBRAMYCIN) 100 MG capsule, Take 1 capsule (100 mg total) by mouth 2 (two) times daily. One po bid x 7 days, Disp: 14 capsule, Rfl: 0   midodrine (PROAMATINE) 5 MG tablet, Take 3 tablets (15 mg total) by mouth 3 (three) times daily with meals., Disp: 180 tablet, Rfl: 1   Multiple Vitamins-Minerals (MULTIVITAMIN WITH MINERALS) tablet,  Take 1 tablet by mouth daily., Disp: , Rfl:    oxyCODONE (OXY IR/ROXICODONE) 5 MG immediate release tablet, Take 1 tablet (5 mg total) by mouth every 6 (six) hours as needed for severe pain., Disp: 30 tablet, Rfl: 0   pantoprazole (PROTONIX) 40 MG tablet, Take 1 tablet (40 mg total) by mouth 2 (two) times daily. (Patient taking differently: Take 40 mg by mouth daily.), Disp: 60 tablet, Rfl: 1   predniSONE (DELTASONE) 10 MG tablet, Take 10 mg by mouth daily with breakfast., Disp: , Rfl:    tacrolimus (PROGRAF) 0.5 MG capsule, Take 0.5 mg by mouth daily., Disp: , Rfl:    tamsulosin (FLOMAX) 0.4 MG CAPS capsule, Take 0.4 mg by mouth daily. , Disp: , Rfl:   Past Medical Problems: Past Medical History:  Diagnosis Date   Atrial fibrillation (HCC)    CHF (congestive heart failure) (HCC)    Chronic kidney disease    Coronary artery disease    DVT (deep venous thrombosis) (HCC) 08/30/2022   left leg   Dysrhythmia    A-fib   H/O heart artery stent    HLD (hyperlipidemia)    Hypertension    Kidney transplanted    Pneumonia    Sleep apnea    CPAP    Past Surgical History: Past Surgical History:  Procedure Laterality Date   ABLATION     CHOLECYSTECTOMY     COLONOSCOPY WITH PROPOFOL N/A 12/09/2020   non-bleeding internal hemorrhoids, sigmoid and descending colon diverticulosis, stool in entire examined colon.    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   TRANSPLANTATION RENAL  2005    The patient has had anesthesia or sedation in the past.   The patient has not had problems with anesthesia.  The patient does not have a family history of anesthesia problems.  No anesthesia problems have been noted in the past.  Social History: Social History   Socioeconomic History   Marital status: Married    Spouse name: Not on file   Number of children: 4   Years of education: Not on file   Highest education level: Not on file  Occupational History   Occupation: partial disabled   Occupation: host  Tobacco Use   Smoking status: Never   Smokeless tobacco: Never  Substance and Sexual Activity   Alcohol use: No   Drug use: No   Sexual activity: Not on file  Other Topics Concern   Not on file  Social History Narrative   Not on file   Social Determinants of Health   Financial Resource Strain: Not on file  Food Insecurity: No Food Insecurity (08/27/2022)   Hunger Vital Sign    Worried About Running Out of Food in the Last Year: Never true    Ran Out of Food in the Last Year: Never true  Transportation Needs: No Transportation Needs (08/27/2022)   PRAPARE - Transportation    Lack of Transportation (Medical): No    Lack of Transportation (Non-Medical): No  Physical Activity: Not on file  Stress: Not on file  Social Connections: Not on file  Intimate Partner Violence: Not At Risk (08/27/2022)   Humiliation, Afraid, Rape, and Kick questionnaire    Fear of Current or Ex-Partner: No    Emotionally Abused: No    Physically Abused: No    Sexually Abused: No    Family History: Family History  Problem Relation Age of Onset   Hypertension Mother    Heart attack Father    Hypertension Maternal Grandmother    Stroke Maternal Grandfather    Heart attack Paternal Uncle    Heart attack Paternal Uncle    Heart attack Paternal Aunt    Stroke Maternal Uncle     Review of Systems: General ROS: Patient is on dialysis for  chronic renal failure, he has atrial fibrillation for which she is treated with Eliquis amiodarone metoprolol, the DVT as noted before above, history of coronary artery disease.  Open wound on the anterior surface of the left ankle Dermatological ROS: Open wound on the anterior surface of the left ankle Cardiovascular ROS: Atrial fibrillation and stent placement ENT ROS: negative Gastrointestinal ROS: no abdominal pain, change in bowel habits, or black or bloody stools  Physical Exam: Vital Signs BP 95/60 (BP Location: Right Arm, Patient Position: Sitting, Cuff Size: Small)   Pulse (!) 114   Ht 6' 1" (1.854 m)   Wt 209 lb 9.6 oz (95.1 kg)   SpO2 100%   BMI 27.65 kg/m  General:alert and cooperative HEENT:negative Neck: normal Chest: Normal. Breast: normal appearance, no masses or tenderness, Inspection negative Cardiac: Atrial   fibrillation Abdomen: Not examined GU: Not examined Musculoskeletal: Extremities intact Neuro: Grossly intact Extremities: All extremities intact, a wound approximately 8 x 10 cm on the anterior aspect of the left ankle with the flexor tendons exposed. Skin: Wound is noted Assessment: 56 y.o.male  with a history of an open wound on the anterior surface of the left ankle  Plan: Mr. Steven is scheduled for debridement of the open wound with placement of myriad with Dr. Jacoba Cherney.  Risks, benefits, and alternatives of procedure discussed, questions answered and consent obtained.     Electronically signed by: Rudi Bunyard B Kareena Arrambide, MD 10/20/2022 3:57 PM            

## 2022-10-21 ENCOUNTER — Encounter (HOSPITAL_COMMUNITY): Payer: Self-pay | Admitting: Plastic Surgery

## 2022-10-21 ENCOUNTER — Ambulatory Visit (HOSPITAL_BASED_OUTPATIENT_CLINIC_OR_DEPARTMENT_OTHER): Payer: Medicare Other | Admitting: Certified Registered"

## 2022-10-21 ENCOUNTER — Encounter (HOSPITAL_COMMUNITY)
Admission: RE | Disposition: A | Discharge status: Home / Self Care | Payer: Self-pay | Source: Home / Self Care | Attending: Plastic Surgery

## 2022-10-21 ENCOUNTER — Other Ambulatory Visit: Payer: Self-pay

## 2022-10-21 ENCOUNTER — Ambulatory Visit (HOSPITAL_COMMUNITY)
Admission: RE | Admit: 2022-10-21 | Discharge: 2022-10-21 | Disposition: A | Discharge status: Home / Self Care | Payer: Medicare Other | Attending: Plastic Surgery | Admitting: Plastic Surgery

## 2022-10-21 ENCOUNTER — Ambulatory Visit (HOSPITAL_COMMUNITY): Payer: Medicare Other | Admitting: Certified Registered"

## 2022-10-21 DIAGNOSIS — I4891 Unspecified atrial fibrillation: Secondary | ICD-10-CM

## 2022-10-21 DIAGNOSIS — G4733 Obstructive sleep apnea (adult) (pediatric): Secondary | ICD-10-CM

## 2022-10-21 DIAGNOSIS — S81802A Unspecified open wound, left lower leg, initial encounter: Secondary | ICD-10-CM | POA: Diagnosis not present

## 2022-10-21 DIAGNOSIS — I509 Heart failure, unspecified: Secondary | ICD-10-CM | POA: Insufficient documentation

## 2022-10-21 DIAGNOSIS — Z86718 Personal history of other venous thrombosis and embolism: Secondary | ICD-10-CM | POA: Diagnosis not present

## 2022-10-21 DIAGNOSIS — I96 Gangrene, not elsewhere classified: Secondary | ICD-10-CM | POA: Diagnosis not present

## 2022-10-21 DIAGNOSIS — I132 Hypertensive heart and chronic kidney disease with heart failure and with stage 5 chronic kidney disease, or end stage renal disease: Secondary | ICD-10-CM | POA: Insufficient documentation

## 2022-10-21 DIAGNOSIS — Z9989 Dependence on other enabling machines and devices: Secondary | ICD-10-CM

## 2022-10-21 DIAGNOSIS — N186 End stage renal disease: Secondary | ICD-10-CM | POA: Diagnosis not present

## 2022-10-21 DIAGNOSIS — S91002A Unspecified open wound, left ankle, initial encounter: Secondary | ICD-10-CM | POA: Insufficient documentation

## 2022-10-21 DIAGNOSIS — Z955 Presence of coronary angioplasty implant and graft: Secondary | ICD-10-CM | POA: Diagnosis not present

## 2022-10-21 DIAGNOSIS — I2510 Atherosclerotic heart disease of native coronary artery without angina pectoris: Secondary | ICD-10-CM | POA: Insufficient documentation

## 2022-10-21 DIAGNOSIS — E785 Hyperlipidemia, unspecified: Secondary | ICD-10-CM | POA: Diagnosis not present

## 2022-10-21 DIAGNOSIS — Z7982 Long term (current) use of aspirin: Secondary | ICD-10-CM | POA: Insufficient documentation

## 2022-10-21 DIAGNOSIS — Z7901 Long term (current) use of anticoagulants: Secondary | ICD-10-CM | POA: Insufficient documentation

## 2022-10-21 DIAGNOSIS — Z940 Kidney transplant status: Secondary | ICD-10-CM | POA: Diagnosis not present

## 2022-10-21 DIAGNOSIS — X58XXXA Exposure to other specified factors, initial encounter: Secondary | ICD-10-CM | POA: Diagnosis not present

## 2022-10-21 HISTORY — PX: INCISION AND DRAINAGE OF WOUND: SHX1803

## 2022-10-21 SURGERY — IRRIGATION AND DEBRIDEMENT WOUND
Anesthesia: General | Site: Leg Lower | Laterality: Left

## 2022-10-21 SURGERY — Surgical Case
Anesthesia: *Unknown

## 2022-10-21 MED ORDER — LIDOCAINE 2% (20 MG/ML) 5 ML SYRINGE
INTRAMUSCULAR | Status: DC | PRN
  Administered 2022-10-21: 80 mg via INTRAVENOUS

## 2022-10-21 MED ORDER — HEMOSTATIC AGENTS (NO CHARGE) OPTIME
TOPICAL | Status: DC | PRN
  Administered 2022-10-21: 1 via TOPICAL

## 2022-10-21 MED ORDER — ONDANSETRON HCL 4 MG/2ML IJ SOLN: 4.0000 mg | Freq: Once | INTRAMUSCULAR | Status: DC | PRN

## 2022-10-21 MED ORDER — 0.9 % SODIUM CHLORIDE (POUR BTL) OPTIME
TOPICAL | Status: DC | PRN
  Administered 2022-10-21: 1000 mL

## 2022-10-21 MED ORDER — OXYCODONE HCL 5 MG PO TABS: 5.0000 mg | ORAL_TABLET | Freq: Three times a day (TID) | ORAL | 0 refills | Status: DC | PRN

## 2022-10-21 MED ORDER — CHLORHEXIDINE GLUCONATE CLOTH 2 % EX PADS: 6.0000 | MEDICATED_PAD | Freq: Once | CUTANEOUS | Status: DC

## 2022-10-21 MED ORDER — CHLORHEXIDINE GLUCONATE 0.12 % MT SOLN
OROMUCOSAL | Status: AC
  Administered 2022-10-21: 15 mL via OROMUCOSAL
  Filled 2022-10-21: qty 15

## 2022-10-21 MED ORDER — OXYCODONE HCL 5 MG PO TABS
ORAL_TABLET | ORAL | Status: AC
  Filled 2022-10-21: qty 1

## 2022-10-21 MED ORDER — ACETAMINOPHEN 10 MG/ML IV SOLN: 1000.0000 mg | Freq: Once | INTRAVENOUS | Status: DC | PRN

## 2022-10-21 MED ORDER — ONDANSETRON HCL 4 MG/2ML IJ SOLN
INTRAMUSCULAR | Status: DC | PRN
  Administered 2022-10-21: 4 mg via INTRAVENOUS

## 2022-10-21 MED ORDER — METOPROLOL TARTRATE 5 MG/5ML IV SOLN
2.5000 mg | Freq: Once | INTRAVENOUS | Status: AC
  Administered 2022-10-21: 2.5 mg via INTRAVENOUS

## 2022-10-21 MED ORDER — METOPROLOL TARTRATE 5 MG/5ML IV SOLN
INTRAVENOUS | Status: AC
  Filled 2022-10-21: qty 5

## 2022-10-21 MED ORDER — PHENYLEPHRINE 80 MCG/ML (10ML) SYRINGE FOR IV PUSH (FOR BLOOD PRESSURE SUPPORT)
PREFILLED_SYRINGE | INTRAVENOUS | Status: DC | PRN
  Administered 2022-10-21 (×6): 80 ug via INTRAVENOUS

## 2022-10-21 MED ORDER — PHENYLEPHRINE 80 MCG/ML (10ML) SYRINGE FOR IV PUSH (FOR BLOOD PRESSURE SUPPORT)
PREFILLED_SYRINGE | INTRAVENOUS | Status: AC
  Filled 2022-10-21: qty 10

## 2022-10-21 MED ORDER — FENTANYL CITRATE (PF) 250 MCG/5ML IJ SOLN
INTRAMUSCULAR | Status: DC | PRN
  Administered 2022-10-21 (×3): 50 ug via INTRAVENOUS

## 2022-10-21 MED ORDER — ESMOLOL HCL 100 MG/10ML IV SOLN
INTRAVENOUS | Status: AC
  Filled 2022-10-21: qty 20

## 2022-10-21 MED ORDER — LIDOCAINE-EPINEPHRINE 1 %-1:100000 IJ SOLN
INTRAMUSCULAR | Status: AC
  Filled 2022-10-21: qty 1

## 2022-10-21 MED ORDER — PROPOFOL 10 MG/ML IV BOLUS
INTRAVENOUS | Status: DC | PRN
  Administered 2022-10-21: 120 mg via INTRAVENOUS

## 2022-10-21 MED ORDER — CHLORHEXIDINE GLUCONATE 0.12 % MT SOLN: 15.0000 mL | Freq: Once | OROMUCOSAL | Status: AC

## 2022-10-21 MED ORDER — DEXAMETHASONE SODIUM PHOSPHATE 10 MG/ML IJ SOLN
INTRAMUSCULAR | Status: AC
  Filled 2022-10-21: qty 1

## 2022-10-21 MED ORDER — CEFAZOLIN SODIUM-DEXTROSE 2-4 GM/100ML-% IV SOLN
2.0000 g | INTRAVENOUS | Status: AC
  Administered 2022-10-21: 2 g via INTRAVENOUS
  Filled 2022-10-21: qty 100

## 2022-10-21 MED ORDER — OXYCODONE HCL 5 MG/5ML PO SOLN: 5.0000 mg | Freq: Once | ORAL | Status: AC | PRN

## 2022-10-21 MED ORDER — ESMOLOL HCL 100 MG/10ML IV SOLN
INTRAVENOUS | Status: DC | PRN
  Administered 2022-10-21 (×2): 20 mg via INTRAVENOUS

## 2022-10-21 MED ORDER — FENTANYL CITRATE (PF) 250 MCG/5ML IJ SOLN
INTRAMUSCULAR | Status: AC
  Filled 2022-10-21: qty 5

## 2022-10-21 MED ORDER — SODIUM CHLORIDE 0.9 % IV SOLN: INTRAVENOUS | Status: DC

## 2022-10-21 MED ORDER — OXYCODONE HCL 5 MG PO TABS
5.0000 mg | ORAL_TABLET | Freq: Once | ORAL | Status: AC | PRN
  Administered 2022-10-21: 5 mg via ORAL

## 2022-10-21 MED ORDER — HYDROMORPHONE HCL 1 MG/ML IJ SOLN: 0.2500 mg | INTRAMUSCULAR | Status: DC | PRN

## 2022-10-21 MED ORDER — LIDOCAINE 2% (20 MG/ML) 5 ML SYRINGE
INTRAMUSCULAR | Status: AC
  Filled 2022-10-21: qty 5

## 2022-10-21 MED ORDER — DEXAMETHASONE SODIUM PHOSPHATE 10 MG/ML IJ SOLN
INTRAMUSCULAR | Status: DC | PRN
  Administered 2022-10-21: 4 mg via INTRAVENOUS

## 2022-10-21 MED ORDER — MIDAZOLAM HCL 2 MG/2ML IJ SOLN
INTRAMUSCULAR | Status: AC
  Filled 2022-10-21: qty 2

## 2022-10-21 MED ORDER — ORAL CARE MOUTH RINSE: 15.0000 mL | Freq: Once | OROMUCOSAL | Status: AC

## 2022-10-21 MED ORDER — ONDANSETRON HCL 4 MG/2ML IJ SOLN
INTRAMUSCULAR | Status: AC
  Filled 2022-10-21: qty 2

## 2022-10-21 SURGICAL SUPPLY — 65 items
APPLICATOR COTTON TIP 6 STRL (MISCELLANEOUS) IMPLANT
APPLICATOR COTTON TIP 6IN STRL (MISCELLANEOUS) IMPLANT
BAG COUNTER SPONGE SURGICOUNT (BAG) ×1 IMPLANT
BAG DECANTER FOR FLEXI CONT (MISCELLANEOUS) IMPLANT
BENZOIN TINCTURE PRP APPL 2/3 (GAUZE/BANDAGES/DRESSINGS) ×1 IMPLANT
BNDG ELASTIC 6X5.8 VLCR STR LF (GAUZE/BANDAGES/DRESSINGS) IMPLANT
BNDG GAUZE DERMACEA FLUFF 4 (GAUZE/BANDAGES/DRESSINGS) IMPLANT
CANISTER SUCT 3000ML PPV (MISCELLANEOUS) ×1 IMPLANT
CNTNR URN SCR LID CUP LEK RST (MISCELLANEOUS) IMPLANT
CONT SPEC 4OZ STRL OR WHT (MISCELLANEOUS) ×4
COVER SURGICAL LIGHT HANDLE (MISCELLANEOUS) ×1 IMPLANT
DRAIN CHANNEL 19F RND (DRAIN) IMPLANT
DRAIN JP 10F RND SILICONE (MISCELLANEOUS) IMPLANT
DRAPE HALF SHEET 40X57 (DRAPES) IMPLANT
DRAPE IMP U-DRAPE 54X76 (DRAPES) ×1 IMPLANT
DRAPE INCISE IOBAN 66X45 STRL (DRAPES) IMPLANT
DRAPE LAPAROSCOPIC ABDOMINAL (DRAPES) IMPLANT
DRAPE LAPAROTOMY 100X72 PEDS (DRAPES) ×1 IMPLANT
DRSG ADAPTIC 3X8 NADH LF (GAUZE/BANDAGES/DRESSINGS) IMPLANT
DRSG CALCIUM ALGINATE 4X4 (GAUZE/BANDAGES/DRESSINGS) IMPLANT
DRSG CUTIMED SORBACT 7X9 (GAUZE/BANDAGES/DRESSINGS) IMPLANT
DRSG VAC ATS LRG SENSATRAC (GAUZE/BANDAGES/DRESSINGS) IMPLANT
DRSG VAC ATS MED SENSATRAC (GAUZE/BANDAGES/DRESSINGS) IMPLANT
DRSG VAC ATS SM SENSATRAC (GAUZE/BANDAGES/DRESSINGS) IMPLANT
ELECT CAUTERY BLADE 6.4 (BLADE) IMPLANT
ELECT REM PT RETURN 9FT ADLT (ELECTROSURGICAL) ×1
ELECTRODE REM PT RTRN 9FT ADLT (ELECTROSURGICAL) ×1 IMPLANT
GAUZE PAD ABD 8X10 STRL (GAUZE/BANDAGES/DRESSINGS) IMPLANT
GAUZE SPONGE 4X4 12PLY STRL (GAUZE/BANDAGES/DRESSINGS) IMPLANT
GLOVE BIO SURGEON STRL SZ8 (GLOVE) ×1 IMPLANT
GLOVE BIOGEL PI IND STRL 6.5 (GLOVE) IMPLANT
GLOVE BIOGEL PI IND STRL 8 (GLOVE) ×1 IMPLANT
GLOVE SURG SS PI 6.5 STRL IVOR (GLOVE) IMPLANT
GLOVE SURG SS PI 7.5 STRL IVOR (GLOVE) IMPLANT
GOWN STRL REUS W/ TWL LRG LVL3 (GOWN DISPOSABLE) ×3 IMPLANT
GOWN STRL REUS W/ TWL XL LVL3 (GOWN DISPOSABLE) IMPLANT
GOWN STRL REUS W/TWL LRG LVL3 (GOWN DISPOSABLE) ×2
GOWN STRL REUS W/TWL XL LVL3 (GOWN DISPOSABLE) ×2
GRAFT MYRIAD 3 LAYER 10X10 (Graft) IMPLANT
HEMOSTAT ARISTA ABSORB 3G PWDR (HEMOSTASIS) IMPLANT
KIT BASIN OR (CUSTOM PROCEDURE TRAY) ×1 IMPLANT
KIT TURNOVER KIT B (KITS) ×1 IMPLANT
NDL HYPO 25GX1X1/2 BEV (NEEDLE) ×1 IMPLANT
NEEDLE HYPO 25GX1X1/2 BEV (NEEDLE) ×1 IMPLANT
NS IRRIG 1000ML POUR BTL (IV SOLUTION) ×1 IMPLANT
PACK GENERAL/GYN (CUSTOM PROCEDURE TRAY) ×1 IMPLANT
PACK UNIVERSAL I (CUSTOM PROCEDURE TRAY) ×1 IMPLANT
PAD ARMBOARD 7.5X6 YLW CONV (MISCELLANEOUS) ×2 IMPLANT
SOL PREP POV-IOD 4OZ 10% (MISCELLANEOUS) IMPLANT
SOL SCRUB PVP POV-IOD 4OZ 7.5% (MISCELLANEOUS) ×1
SOLUTION SCRB POV-IOD 4OZ 7.5% (MISCELLANEOUS) IMPLANT
STAPLER VISISTAT 35W (STAPLE) ×1 IMPLANT
SURGILUBE 2OZ TUBE FLIPTOP (MISCELLANEOUS) IMPLANT
SUT ETHILON 3 0 PS 1 (SUTURE) IMPLANT
SUT MNCRL AB 3-0 PS2 27 (SUTURE) IMPLANT
SUT MNCRL AB 4-0 PS2 18 (SUTURE) IMPLANT
SUT MON AB 2-0 CT1 36 (SUTURE) IMPLANT
SUT MON AB 5-0 PS2 18 (SUTURE) IMPLANT
SUT VIC AB 5-0 PS2 18 (SUTURE) IMPLANT
SUT VICRYL 3 0 (SUTURE) IMPLANT
SWAB COLLECTION DEVICE MRSA (MISCELLANEOUS) IMPLANT
SWAB CULTURE ESWAB REG 1ML (MISCELLANEOUS) IMPLANT
SYR CONTROL 10ML LL (SYRINGE) ×1 IMPLANT
TOWEL GREEN STERILE (TOWEL DISPOSABLE) ×1 IMPLANT
UNDERPAD 30X36 HEAVY ABSORB (UNDERPADS AND DIAPERS) ×1 IMPLANT

## 2022-10-21 NOTE — Anesthesia Preprocedure Evaluation (Addendum)
Anesthesia Evaluation  Patient identified by MRN, date of birth, ID band Patient awake    Reviewed: Allergy & Precautions, NPO status , Patient's Chart, lab work & pertinent test results  Airway Mallampati: II  TM Distance: >3 FB Neck ROM: Full    Dental no notable dental hx. (+) Teeth Intact, Dental Advisory Given   Pulmonary sleep apnea and Continuous Positive Airway Pressure Ventilation    Pulmonary exam normal breath sounds clear to auscultation       Cardiovascular hypertension, + DVT  Normal cardiovascular exam+ dysrhythmias Atrial Fibrillation  Rhythm:Regular Rate:Normal     Neuro/Psych    GI/Hepatic ,GERD  ,,  Endo/Other    Renal/GU ESRF and Renal InsufficiencyRenal diseaseKidney Trans[lant  Lab Results      Component                Value               Date                      CREATININE               11.98 (H)           09/04/2022                BUN                      55 (H)              09/04/2022                NA                       137                 09/04/2022                K                        4.6                 09/04/2022                CL                       94 (L)              09/04/2022                CO2                      26                  09/04/2022           Venita Sheffield, F Dialysis       Musculoskeletal   Abdominal   Peds  Hematology Lab Results      Component                Value               Date                      WBC  12.6 (H)            09/04/2022                HGB                      14.4                09/04/2022                HCT                      46.3                09/04/2022                MCV                      97.9                09/04/2022                PLT                      294                 09/04/2022              Anesthesia Other Findings All: vancomycin  Reproductive/Obstetrics                              Anesthesia Physical Anesthesia Plan  ASA: 4  Anesthesia Plan: General   Post-op Pain Management: Precedex and Tylenol PO (pre-op)*   Induction: Intravenous  PONV Risk Score and Plan: 3 and Treatment may vary due to age or medical condition, Ondansetron, Midazolam and Dexamethasone  Airway Management Planned: LMA  Additional Equipment: None  Intra-op Plan:   Post-operative Plan:   Informed Consent: I have reviewed the patients History and Physical, chart, labs and discussed the procedure including the risks, benefits and alternatives for the proposed anesthesia with the patient or authorized representative who has indicated his/her understanding and acceptance.     Dental advisory given  Plan Discussed with: CRNA, Anesthesiologist and Surgeon  Anesthesia Plan Comments: (Pt on Eliquis)        Anesthesia Quick Evaluation

## 2022-10-21 NOTE — Op Note (Signed)
DATE OF OPERATION: 10/21/2022  LOCATION: [facility] operating Room  PREOPERATIVE DIAGNOSIS: Left anterior ankle open wound with exposed extensor tendon  POSTOPERATIVE DIAGNOSIS: Same  PROCEDURE: Debridement of wound, biopsy of wound, placement of myriad matrix  SURGEON: Jeanann Lewandowsky, MD  ASSISTANT: Krista Blue  EBL: 10 mL   CONDITION: Stable  COMPLICATIONS: None  INDICATION: The patient, [name], is a 56 y.o. male born on 1966-03-16, is here for treatment initial treatment to have an open wound that resulted from a DVT of the left leg.  I was consulted to assist with management once the extensor tendon of the foot was exposed  PROCEDURE DETAILS:  The patient was seen prior to surgery and marked.  IV antibiotics were given. The patient was taken to the operating room and given a general anesthetic. A standard time out was performed and all information was confirmed by those in the room. SCDs were placed.   The wound was initially debrided with curettes and sharp dissection with a 10 blade.  Once I removed most of the fibrinous exudate biopsies were taken from the periphery of the wound at the 912 and 3:00 positions as well as an additional biopsy at the center of the wound.  Minimal debridement of the extensor tendons was performed.  There was no bleeding in the tendon.  At this point the wound measured 10 x 8 cm.  I placed a 10 x 10 cm 3 ply portion of [employer] tissue matrix and trimmed it to the size of the open wound.  The excess matrix was used to place a double layer over the tendon.  Sorbact was then sutured over the top of the wound.  The wound was dressed with water-based surgical lubrication wet gauze and ABD pad and an Ace wrap. The patient was allowed to wake up and taken to recovery room in stable condition at the end of the case. The family was notified at the end of the case.  Instrument needle and sponge counts were reported as correct.  [name] will be discharged home  after an appropriate period of time in the PACU and follow-up will be arranged to my clinic in 2 weeks.  The advanced practice practitioner (APP), Mr. Krista Blue, assisted throughout the case.  The APP was essential in retraction and counter traction when needed to make the case progress smoothly.  This retraction and assistance made it possible to see the tissue plans for the procedure.  The assistance was needed for blood control, tissue re-approximation and assisted with closure of the incision site.

## 2022-10-21 NOTE — Discharge Instructions (Addendum)
Diet: High protein, low sugar.  Medications: For pain control, please take Tylenol 500 mg every 6 hours as needed.  The prescribed oxycodone should be taken only as needed for breakthrough pain.  Please note that this drug can make you drowsy. PICK THIS MEDICATIONS UP AT THE [facility] ON [address] HERE IN [place] AS THIS IS A 24/7 PHARMACY.  Wound care: Your wound was debrided and biopsies were procured.  We then placed a wound matrix followed by Farren Nelles Sorbact dressing that has been secured with sutures.  KY Jelly was placed over top of the Sorbact followed by moistened gauze, Kerlix, and an Ace wrap.    Do NOT change the dressing for 24 hours. You may then begin twice daily dressing changes. Place KY jelly over top of the sorbact followed by saline-moistened gauze, Kerlix, and secure with Ace wrap.  Dressing items to purchase: Box of 4 x 4 inch gauze. ABD (abdominal) pads or similar thick absorbent pad.  KY jelly or Surgilube.  Starting evening 10/22/22, recommend dressing changes twice daily.  Take the gauze off until you see the Seydou Hearns Sorbact and then STOP.  Apply a liberal amount of KY jelly (or Surgilube).  Apply 4 x 4 inch gauze that has been moistened with sterile water/saline. Wrap with Kerlix. Secure the dressing with Ace wrap.  Activity: As tolerated.  Mild drainage from underneath the dressing is OK. However, please call the office should you have severe pain or swelling, surrounding redness or malodor, wound dehiscence (if it completely opens), or if you have any fevers.

## 2022-10-21 NOTE — Transfer of Care (Signed)
Immediate Anesthesia Transfer of Care Note  Patient: [name]  Procedure(s) Performed: IRRIGATION AND DEBRIDEMENT WOUND left leg wound with placement of myriad (Left: Leg Lower) APPLICATION OF SKIN SUBSTITUTE (Left: Leg Lower)  Patient Location: PACU  Anesthesia Type:General  Level of Consciousness: awake, alert , and oriented  Airway & Oxygen Therapy: Patient Spontanous Breathing and Patient connected to nasal cannula oxygen  Post-op Assessment: Report given to RN and Post -op Vital signs reviewed and unstable, Anesthesiologist notified  Post vital signs: Reviewed and unstable  Last Vitals:  Vitals Value Taken Time  BP 106/72 10/21/22 1707  Temp 36.9 C 10/21/22 1705  Pulse 114 10/21/22 1713  Resp 22 10/21/22 1713  SpO2 96 % 10/21/22 1713  Vitals shown include unvalidated device data.  Last Pain:  Vitals:   10/21/22 1349  TempSrc:   PainSc: 2          Complications: Pt in and out of RVR. Houser at bedside and treating

## 2022-10-21 NOTE — Interval H&P Note (Signed)
History and Physical Interval Note: Pt met in the pre op holding area. No change in physical exam or indication for procedure.  All questions answered.  Will proceed with wound debridement, placement of tissue matrix at his request 10/21/2022 3:54 PM  [name]  has presented today for surgery, with the diagnosis of open wound, left lower leg.  The various methods of treatment have been discussed with the patient and family. After consideration of risks, benefits and other options for treatment, the patient has consented to  Procedure(s): IRRIGATION AND DEBRIDEMENT WOUND left leg wound with placement of myriad (Left) APPLICATION OF SKIN SUBSTITUTE (Left) as a surgical intervention.  The patient's history has been reviewed, patient examined, no change in status, stable for surgery.  I have reviewed the patient's chart and labs.  Questions were answered to the patient's satisfaction.     [name]

## 2022-10-21 NOTE — Anesthesia Procedure Notes (Signed)
Procedure Name: LMA Insertion Date/Time: 10/21/2022 4:15 PM  Performed by: Anastasio Auerbach, CRNAPre-anesthesia Checklist: Patient identified, Emergency Drugs available, Suction available and Patient being monitored Patient Re-evaluated:Patient Re-evaluated prior to induction Oxygen Delivery Method: Circle system utilized Preoxygenation: Pre-oxygenation with 100% oxygen Induction Type: IV induction Ventilation: Mask ventilation without difficulty LMA: LMA with gastric port inserted LMA Size: 5.0 Placement Confirmation: breath sounds checked- equal and bilateral and positive ETCO2 Tube secured with: Tape

## 2022-10-22 ENCOUNTER — Encounter (HOSPITAL_BASED_OUTPATIENT_CLINIC_OR_DEPARTMENT_OTHER): Payer: Medicare Other | Admitting: General Surgery

## 2022-10-22 ENCOUNTER — Encounter (HOSPITAL_COMMUNITY): Payer: Self-pay | Admitting: Plastic Surgery

## 2022-10-22 LAB — POCT I-STAT, CHEM 8
BUN: 58 mg/dL — ABNORMAL HIGH (ref 6–20)
Calcium, Ion: 0.95 mmol/L — ABNORMAL LOW (ref 1.15–1.40)
Chloride: 103 mmol/L (ref 98–111)
Creatinine, Ser: 12.1 mg/dL — ABNORMAL HIGH (ref 0.61–1.24)
Glucose, Bld: 89 mg/dL (ref 70–99)
HCT: 41 % (ref 39.0–52.0)
Hemoglobin: 13.9 g/dL (ref 13.0–17.0)
Potassium: 4.6 mmol/L (ref 3.5–5.1)
Sodium: 141 mmol/L (ref 135–145)
TCO2: 25 mmol/L (ref 22–32)

## 2022-10-22 NOTE — Anesthesia Postprocedure Evaluation (Signed)
Anesthesia Post Note  Patient: [name]  Procedure(s) Performed: IRRIGATION AND DEBRIDEMENT WOUND left leg wound with placement of myriad (Left: Leg Lower) APPLICATION OF SKIN SUBSTITUTE (Left: Leg Lower)     Patient location during evaluation: PACU Anesthesia Type: General Level of consciousness: awake and alert Pain management: pain level controlled Vital Signs Assessment: post-procedure vital signs reviewed and stable Respiratory status: spontaneous breathing, nonlabored ventilation, respiratory function stable and patient connected to nasal cannula oxygen Cardiovascular status: blood pressure returned to baseline and stable Postop Assessment: no apparent nausea or vomiting Anesthetic complications: no  No notable events documented.  Last Vitals:  Vitals:   10/21/22 1715 10/21/22 1730  BP: 106/88 102/84  Pulse: (!) 132 69  Resp: 18 15  Temp:  36.9 C  SpO2: 99% 97%    Last Pain:  Vitals:   10/21/22 1715  TempSrc:   PainSc: 3                  Stephen A Houser      

## 2022-10-25 LAB — SURGICAL PATHOLOGY

## 2022-10-28 ENCOUNTER — Ambulatory Visit (HOSPITAL_BASED_OUTPATIENT_CLINIC_OR_DEPARTMENT_OTHER): Payer: Medicare Other | Admitting: Internal Medicine

## 2022-10-28 ENCOUNTER — Other Ambulatory Visit (HOSPITAL_COMMUNITY): Payer: Medicare Other

## 2022-11-04 ENCOUNTER — Encounter: Payer: Self-pay | Admitting: Plastic Surgery

## 2022-11-04 ENCOUNTER — Ambulatory Visit (INDEPENDENT_AMBULATORY_CARE_PROVIDER_SITE_OTHER): Payer: Medicare Other | Admitting: Plastic Surgery

## 2022-11-04 VITALS — BP 98/64 | HR 94

## 2022-11-04 DIAGNOSIS — S96902A Unspecified injury of unspecified muscle and tendon at ankle and foot level, left foot, initial encounter: Secondary | ICD-10-CM

## 2022-11-04 DIAGNOSIS — S91002A Unspecified open wound, left ankle, initial encounter: Secondary | ICD-10-CM

## 2022-11-04 NOTE — Progress Notes (Signed)
[name] returns today for evaluation of his left foot.  Undergone placement of myriad on the anterior portion of his foot to try to obtain coverage over an exposed flexor tendon and a stable base for skin grafting the rest of the foot.  He reports no difficulty after the surgery and has had no problems in the interim.  I remove the Sorbact and the remaining sutures.  On evaluation there was less than 10% take of the myriad and no appreciable take over the anterior surface of the flexor tendon.  I debrided the loose myriad and fibrinous exudate.  We will start with a new dressing using surgical lube, Adaptic, and saline dampened gauze.  I will also get him back on the schedule for redebridement of the surgical site and placement of ACell in an attempt to cover the tendon.I think he would benefit from a wound vac as well. The wound currently measures approximately 7 by 10 cm.  Will schedule at his request

## 2022-11-09 ENCOUNTER — Encounter (HOSPITAL_COMMUNITY): Payer: Self-pay | Admitting: Plastic Surgery

## 2022-11-09 DIAGNOSIS — Z719 Counseling, unspecified: Secondary | ICD-10-CM

## 2022-11-19 ENCOUNTER — Telehealth: Payer: Self-pay | Admitting: *Deleted

## 2022-11-19 NOTE — Telephone Encounter (Signed)
Patient called in states due to his employer not receiving the out of work letter before, it needs to be re-written with the date of 11/18/22 until whenever Dr.Taylor put him out until.

## 2022-11-19 NOTE — Telephone Encounter (Signed)
Returned pt's call. Pt states he needs his fmla/std paperwork to explain further why he needs to be out of work and needs to include incident # [id number]. 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 He is currently wearing a boot and is being scheduled for another surgery this month.

## 2022-11-20 ENCOUNTER — Telehealth: Payer: Self-pay | Admitting: *Deleted

## 2022-11-20 NOTE — Telephone Encounter (Signed)
Auth Pend: [id number] for CPT [place], [id number], [id number], [id number]

## 2022-11-23 ENCOUNTER — Telehealth: Payer: Self-pay | Admitting: *Deleted

## 2022-11-23 ENCOUNTER — Ambulatory Visit (INDEPENDENT_AMBULATORY_CARE_PROVIDER_SITE_OTHER): Payer: Medicare Other | Admitting: Surgical

## 2022-11-23 VITALS — BP 104/76 | HR 79

## 2022-11-23 DIAGNOSIS — S81002A Unspecified open wound, left knee, initial encounter: Secondary | ICD-10-CM

## 2022-11-23 DIAGNOSIS — S81802A Unspecified open wound, left lower leg, initial encounter: Secondary | ICD-10-CM | POA: Diagnosis not present

## 2022-11-23 DIAGNOSIS — S96902A Unspecified injury of unspecified muscle and tendon at ankle and foot level, left foot, initial encounter: Secondary | ICD-10-CM

## 2022-11-23 DIAGNOSIS — S86902A Unspecified injury of unspecified muscle(s) and tendon(s) at lower leg level, left leg, initial encounter: Secondary | ICD-10-CM

## 2022-11-23 DIAGNOSIS — S91002A Unspecified open wound, left ankle, initial encounter: Secondary | ICD-10-CM | POA: Diagnosis not present

## 2022-11-23 NOTE — Telephone Encounter (Signed)
[employer] and spoke with [place] to place order by phone for Xeroform-(1x8-use daily).  Order was placed.//AB/CMA

## 2022-11-23 NOTE — Progress Notes (Signed)
[place] [address] [address] [place] [address]   CC: No chief complaint on file.     [name] is an 57 y.o. male.  HPI: Patient is a 57 year old male who has a wound to the anterior aspect of his left ankle with exposed tendon.  The wound developed after he had a blood clot in his leg, the wound was initially managed by the wound care team, however it has continued to progress and he was referred to Dr. Lovena Le for operative debridement.  He underwent irrigation and debridement application of wound matrix (myriad) on 10/21/2022.  He was last seen on 11/04/2022, less than 10% of the myriad took and the wound was debrided.  The plan was for return to surgery, however currently pending authorization.  Patient returns today for concerns over the appearance of the wound.  Review of Systems General: No fevers or chills  Physical Exam    11/04/2022    1:32 PM 10/21/2022    5:30 PM 10/21/2022    5:15 PM  Vitals with BMI  Systolic 98 353 614  Diastolic 64 84 88  Pulse 94 69 132    General:  No acute distress,  Alert and oriented, Non-Toxic, Normal speech and affect Left lower extremity: Left lower extremity wound with exposed tendon noted laterally, there is some desiccation of the tendon superficially.  The tendon does glide normally with flexion and extension which is causing disruption of the wound bed.  There is granulation tissue noted in the peritendinous area and adjacent to the tendon with a mix of fibrinous exudate also present.  There is no foul odor noted.  No active drainage noted.  There is some mild maceration noted at the wound edges.  The wound is 8.5 x 8.5 cm.  There is minimal lower extremity swelling noted.  He is able to flex and extend the ankle with normal range of motion.   Assessment/Plan  57 year old male with left lower extremity wound, he has been receiving assistance from his wife with dressing changes who is a Marine scientist.  They  have been doing Adaptic and K-Y jelly over the tendon, Xeroform over the granulation tissue followed by a moist 4 x 4 gauze, ABD pad, Kerlix, Ace wrap.  He presented today for concerns over the darkening of the tendon.  We discussed that there is no signs of infection, we discussed that the difficulty with wounds of this nature is that sometimes granulation tissue does not migrate over the tendinous region due to the constant gliding of the tendon and the lack of vascularization at the tendon base.  We discussed continue with K-Y jelly Adaptic over the tendon to keep the area moist and then prevent desiccation of the tendon.  We discussed that our surgical team is currently working on surgical approval via insurance for plans to return to the operating room for application of wound matrix and additional debridement.  Pictures were taken and placed in the patient's chart with patient's permission.  I do think that he may benefit from a boot that extends midway up his calf to prevent flexion and extension of the ankle joint which is continuously disrupting the wound bed.  Will discuss with Dr. Lovena Le to see if he has any recommendations as well.  We will plan to see him back in 1 week in the office unless surgery is scheduled sooner.    [name] 11/23/2022, 8:08 AM

## 2022-11-24 ENCOUNTER — Telehealth: Payer: Self-pay | Admitting: *Deleted

## 2022-11-24 NOTE — Telephone Encounter (Signed)
Spoke to patient and scheduled  sx and related appts

## 2022-11-25 ENCOUNTER — Ambulatory Visit (INDEPENDENT_AMBULATORY_CARE_PROVIDER_SITE_OTHER): Payer: Medicare Other | Admitting: Student

## 2022-11-25 ENCOUNTER — Telehealth: Payer: Self-pay

## 2022-11-25 ENCOUNTER — Encounter: Payer: Self-pay | Admitting: Internal Medicine

## 2022-11-25 VITALS — BP 99/68 | HR 78

## 2022-11-25 DIAGNOSIS — S81002A Unspecified open wound, left knee, initial encounter: Secondary | ICD-10-CM

## 2022-11-25 DIAGNOSIS — S81802A Unspecified open wound, left lower leg, initial encounter: Secondary | ICD-10-CM

## 2022-11-25 DIAGNOSIS — S91002A Unspecified open wound, left ankle, initial encounter: Secondary | ICD-10-CM

## 2022-11-25 DIAGNOSIS — S96902A Unspecified injury of unspecified muscle and tendon at ankle and foot level, left foot, initial encounter: Secondary | ICD-10-CM

## 2022-11-25 DIAGNOSIS — S86902A Unspecified injury of unspecified muscle(s) and tendon(s) at lower leg level, left leg, initial encounter: Secondary | ICD-10-CM

## 2022-11-25 MED ORDER — ONDANSETRON HCL 4 MG PO TABS: 4.0000 mg | ORAL_TABLET | Freq: Three times a day (TID) | ORAL | 0 refills | Status: DC | PRN

## 2022-11-25 MED ORDER — OXYCODONE HCL 5 MG PO TABS: 5.0000 mg | ORAL_TABLET | Freq: Three times a day (TID) | ORAL | 0 refills | Status: DC | PRN

## 2022-11-25 NOTE — Telephone Encounter (Signed)
Faxed surgical clearance to [facility] and Dr. Vilinda Flake

## 2022-11-25 NOTE — Progress Notes (Signed)
[name], male    DOB: [birth year], 57 y.o.   MRN: [id number]  Chief Complaint  Patient presents with   Pre-op Exam      ICD-10-CM   1. Open wound knee/leg with tendon involvment, left, initial encounter  S81.002A    S81.802A    S91.002A    S86.902A    S96.902A        History of Present Illness: [name] is a 57 y.o.  male  with a history of wound to the left foot.  He presents for preoperative evaluation for upcoming procedure, debridement and preparation of the wound to the left foot, placement of Acell and placement of wound vac, scheduled for 11/30/22 with Dr. Lovena Le.  The patient has not had problems with anesthesia.  Patient reports he sees a cardiologist at [facility] cardiology for his A-fib.  Patient reports he finished his Eliquis for his DVT approximately 2 to 3 days ago.  He states he is supposed to start to take a baby aspirin but has not started yet.  Patient states he is not a smoker.   Patient denies being on any hormone replacement.  He reports he has had a DVT in the past.  He denies any family history of clots.  He denies any personal family history of clotting diseases.  He denies any recent surgeries, traumas, infections or hospitalizations.  He denies any history of stroke or heart attack.  He denies any history of Crohn's disease or ulcerative colitis.  He denies any history of COPD or asthma.  He denies any history of cancer.  Patient does reports he has varicose veins.  Patient denies any recent fevers, chills or changes in health.  Patient does report he uses CPAP.  Patient reports he ordered a boot for his left lower extremity.  He believes the boot is going to arrive on Friday.  I discussed with the patient that he should bring this boot with him the day of surgery.  Patient expressed understanding.  Summary of Previous Visit: Patient was last seen in clinic on 11/23/2022.  On exam, left lower extremity wound with exposed tendon noted  laterally.  There is some desiccation of the tendon superficially.  There is granulation tissue noted in the peritendinous area and adjacent to the tendon with a mix of fibrinous exudate present as well.  Patient was able to flex and extend the ankle with normal range of motion.  Plan was to continue K-Y jelly and Adaptic over the tended to keep the area moist.  A boot was also recommended for the patient to prevent flexion and extension of the ankle joint which was continuously disrupting the wound bed.  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  PMH Significant for: A-fib, history of DVT, GERD, ESRD on dialysis, use of CPAP  Patient reports he takes both midodrine and metoprolol.  He states that he takes the midodrine on his days that he goes to dialysis and does not take the metoprolol and his days, and takes the metoprolol on the days he does not have dialysis and hold some midodrine on those days.  He states that in the past, he has taken the midodrine on the morning of surgery and does not take the metoprolol the morning of surgery.   Past Medical History: Allergies: Allergies  Allergen Reactions   Codeine Other (See Comments)    Unknown reaction. Patient denies this dx as of  10/20/22.   Vancomycin Swelling    Current Medications:  Current Outpatient Medications:    acetaminophen (TYLENOL) 325 MG tablet, Take 2 tablets (650 mg total) by mouth every 6 (six) hours as needed for mild pain, fever or headache., Disp: , Rfl:    allopurinol (ZYLOPRIM) 100 MG tablet, Take 100 mg by mouth daily., Disp: , Rfl:    Ascorbic Acid (VITAMIN C PO), Take 1 tablet by mouth daily., Disp: , Rfl:    aspirin 81 MG chewable tablet, Chew 81 mg by mouth daily., Disp: , Rfl:    atorvastatin (LIPITOR) 80 MG tablet, Take 80 mg by mouth daily., Disp: , Rfl:    AURYXIA 1 GM 210 MG(Fe) tablet, Take 420 mg by mouth 3 (three) times daily. Take with each meal and snack, Disp: ,  Rfl:    calcium carbonate (TUMS - DOSED IN MG ELEMENTAL CALCIUM) 500 MG chewable tablet, Chew 1 tablet by mouth 2 (two) times daily with a meal., Disp: , Rfl:    Cholecalciferol (VITAMIN D) 50 MCG (2000 UT) tablet, Take 2,000 Units by mouth daily., Disp: , Rfl:    cyclobenzaprine (FLEXERIL) 10 MG tablet, Take 1 tablet by mouth 2 (two) times daily as needed., Disp: , Rfl:    metoprolol tartrate (LOPRESSOR) 25 MG tablet, Take 25 mg by mouth daily., Disp: , Rfl:    midodrine (PROAMATINE) 5 MG tablet, Take 3 tablets (15 mg total) by mouth 3 (three) times daily with meals., Disp: 180 tablet, Rfl: 1   Multiple Vitamins-Minerals (MULTIVITAMIN WITH MINERALS) tablet, Take 1 tablet by mouth daily., Disp: , Rfl:    ondansetron (ZOFRAN) 4 MG tablet, Take 1 tablet (4 mg total) by mouth every 8 (eight) hours as needed for up to 20 doses for nausea or vomiting., Disp: 20 tablet, Rfl: 0   oxyCODONE (ROXICODONE) 5 MG immediate release tablet, Take 1 tablet (5 mg total) by mouth every 8 (eight) hours as needed for up to 10 doses for severe pain., Disp: 10 tablet, Rfl: 0   pantoprazole (PROTONIX) 40 MG tablet, Take 1 tablet (40 mg total) by mouth 2 (two) times daily. (Patient taking differently: Take 40 mg by mouth daily.), Disp: 60 tablet, Rfl: 1   predniSONE (DELTASONE) 10 MG tablet, Take 10 mg by mouth daily with breakfast., Disp: , Rfl:    tacrolimus (PROGRAF) 0.5 MG capsule, Take 0.5 mg by mouth daily., Disp: , Rfl:    tamsulosin (FLOMAX) 0.4 MG CAPS capsule, Take 0.4 mg by mouth daily. 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    ESOPHAGOGASTRODUODENOSCOPY (EGD) WITH PROPOFOL N/A 03/31/2021   active bleeding in duodenum due to suspected AVM s/p hemostatic spray  GIVENS CAPSULE STUDY N/A 06/04/2021   Procedure: GIVENS CAPSULE STUDY;  Surgeon: Eloise Harman, DO;  [facility];  Service: Endoscopy;  Laterality: N/A;  7:30am   HEMOSTASIS CONTROL  03/31/2021   Procedure: HEMOSTASIS CONTROL;  Surgeon: Thornton Park, MD;  Location: [facility];  Service: Gastroenterology;;   HERNIA MESH REMOVAL     abdominal   INCISION AND DRAINAGE OF WOUND Left 10/21/2022   Procedure: IRRIGATION AND DEBRIDEMENT WOUND left leg wound with placement of myriad;  Surgeon: [name], MD;  Location: [facility];  Service: Plastics;  Laterality: Left;   SMALL BOWEL ENTEROSCOPY     Normal stomach, normal duodenum, 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   TRANSPLANTATION RENAL  2005    Social History: Social History   Socioeconomic History   Marital status: Married    Spouse name: Not on file   Number of children: 4   Years of education: Not on file   Highest education level: Not on file  Occupational History   Occupation: partial disabled   Occupation: host  Tobacco Use   Smoking status: Never   Smokeless tobacco: Never  Vaping Use   Vaping Use: Never used  Substance and Sexual Activity   Alcohol use: No   Drug use: No   Sexual activity: Not on file  Other Topics Concern   Not on file  Social History Narrative   Not on file   Social Determinants of Health   Financial Resource Strain: Not on file  Food Insecurity: No Food Insecurity (08/27/2022)   Hunger Vital Sign    Worried About Running Out of Food in the  Last Year: Never true    Ran Out of Food in the Last Year: Never true  Transportation Needs: No Transportation Needs (08/27/2022)   PRAPARE - Hydrologist (Medical): No    Lack of Transportation (Non-Medical): No  Physical Activity: Not on file  Stress: Not on file  Social Connections: Not on file  Intimate Partner Violence: Not At Risk (08/27/2022)   Humiliation, Afraid, Rape, and Kick questionnaire    Fear of Current or Ex-Partner: No    Emotionally Abused: No    Physically Abused: No    Sexually Abused: No    Family History: Family History  Problem Relation Age of Onset   Hypertension Mother    Heart attack Father    Hypertension Maternal Grandmother    Stroke Maternal Grandfather    Heart attack Paternal Uncle    Heart attack Paternal Uncle    Heart attack Paternal Aunt    Stroke Maternal Uncle     Review of Systems: Denies fevers, chill, recent changes in health  Physical Exam: Vital Signs BP 99/68 (BP Location: Right Arm, Patient Position: Sitting, Cuff Size: Small)   Pulse 78   SpO2 100%   Physical Exam  Constitutional:      General: Not in acute distress.    Appearance: Normal appearance. Not ill-appearing.  HENT:     Head: Normocephalic and atraumatic.  Neck:     Musculoskeletal: Normal range of motion.  Cardiovascular:     Rate and Rhythm: Normal rate Pulmonary:     Effort: Pulmonary effort is normal. No respiratory distress.  Lower Extremities: Varicose veins noted, nonswollen. Medical shoe noted to LLE with dressings to LLE. Dressings are CDI.  Skin:    General: Skin is warm and dry.     Findings: No erythema or rash.  Neurological:      Mental Status: Alert and oriented to person, place, and time. Mental status is at baseline.  Psychiatric:        Mood and Affect: Mood normal.        Behavior: Behavior normal.    Assessment/Plan: The patient is scheduled for debridement in preparation of wound to the left foot,  placement of ACell and wound VAC with Dr. Lovena Le.  Risks, benefits, and alternatives of procedure discussed, questions answered and consent obtained.    Clearance is to be sent to patient's cardiology provider at [facility] and to patient's PCP, Dr. Vilinda Flake.   Smoking Status: Non-smoker; Counseling Given?  N/A  Caprini Score: 8; Risk Factors include: Age, history of DVT, varicose veins, BMI > 25, and length of planned surgery. Recommendation for mechanical prophylaxis and possible pharmacologic prophylaxis. Encourage early ambulation.  I will discuss with Dr. Lovena Le regarding the possibility of postoperative Lovenox for DVT prophylaxis.  Pictures obtained: 11/23/2022  Post-op Rx sent to pharmacy: Oxycodone, Zofran  I discussed with the patient that he should hold all multivitamins, vitamins and dietary supplements at least 1 week prior to surgery.  I discussed with the patient to hold his tacrolimus 1 week prior to surgery.  Patient states that he has held it in the past without issue.  I discussed with the patient that he should hold his Flexeril the day of surgery.  Patient expressed understanding.   Patient was provided with the General Surgical Risk consent document and Pain Medication Agreement prior to their appointment.  They had adequate time to read through the risk consent documents and Pain Medication Agreement. We also discussed them in person together during this preop appointment. All of their questions were answered to their satisfaction.  Recommended calling if they have any further questions.  Risk consent form and Pain Medication Agreement to be scanned into patient's chart.  The consent was obtained with risks and complications reviewed which included bleeding, pain, scar, infection and the risk of anesthesia.  The patients questions were answered to the patients expressed satisfaction.  I discussed with the patient that he may be at an elevated risk of complications  given his medical history.  Patient expressed understanding and states that he still wants to move forward with surgery.   Electronically signed by: Clance Boll, PA-C 11/25/2022 10:23 AM

## 2022-11-25 NOTE — H&P (View-Only) (Signed)
[name], male    DOB: [birth year], 57 y.o.   MRN: [id number]  Chief Complaint  Patient presents with   Pre-op Exam      ICD-10-CM   1. Open wound knee/leg with tendon involvment, left, initial encounter  S81.002A    S81.802A    S91.002A    S86.902A    S96.902A        History of Present Illness: [name] is a 57 y.o.  male  with a history of wound to the left foot.  He presents for preoperative evaluation for upcoming procedure, debridement and preparation of the wound to the left foot, placement of Acell and placement of wound vac, scheduled for 11/30/22 with Dr. Lovena Le.  The patient has not had problems with anesthesia.  Patient reports he sees a cardiologist at [facility] cardiology for his A-fib.  Patient reports he finished his Eliquis for his DVT approximately 2 to 3 days ago.  He states he is supposed to start to take a baby aspirin but has not started yet.  Patient states he is not a smoker.   Patient denies being on any hormone replacement.  He reports he has had a DVT in the past.  He denies any family history of clots.  He denies any personal family history of clotting diseases.  He denies any recent surgeries, traumas, infections or hospitalizations.  He denies any history of stroke or heart attack.  He denies any history of Crohn's disease or ulcerative colitis.  He denies any history of COPD or asthma.  He denies any history of cancer.  Patient does reports he has varicose veins.  Patient denies any recent fevers, chills or changes in health.  Patient does report he uses CPAP.  Patient reports he ordered a boot for his left lower extremity.  He believes the boot is going to arrive on Friday.  I discussed with the patient that he should bring this boot with him the day of surgery.  Patient expressed understanding.  Summary of Previous Visit: Patient was last seen in clinic on 11/23/2022.  On exam, left lower extremity wound with exposed tendon noted  laterally.  There is some desiccation of the tendon superficially.  There is granulation tissue noted in the peritendinous area and adjacent to the tendon with a mix of fibrinous exudate present as well.  Patient was able to flex and extend the ankle with normal range of motion.  Plan was to continue K-Y jelly and Adaptic over the tended to keep the area moist.  A boot was also recommended for the patient to prevent flexion and extension of the ankle joint which was continuously disrupting the wound bed.  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  PMH Significant for: A-fib, history of DVT, GERD, ESRD on dialysis, use of CPAP  Patient reports he takes both midodrine and metoprolol.  He states that he takes the midodrine on his days that he goes to dialysis and does not take the metoprolol and his days, and takes the metoprolol on the days he does not have dialysis and hold some midodrine on those days.  He states that in the past, he has taken the midodrine on the morning of surgery and does not take the metoprolol the morning of surgery.   Past Medical History: Allergies: Allergies  Allergen Reactions   Codeine Other (See Comments)    Unknown reaction. Patient denies this dx as of  10/20/22.   Vancomycin Swelling    Current Medications:  Current Outpatient Medications:    acetaminophen (TYLENOL) 325 MG tablet, Take 2 tablets (650 mg total) by mouth every 6 (six) hours as needed for mild pain, fever or headache., Disp: , Rfl:    allopurinol (ZYLOPRIM) 100 MG tablet, Take 100 mg by mouth daily., Disp: , Rfl:    Ascorbic Acid (VITAMIN C PO), Take 1 tablet by mouth daily., Disp: , Rfl:    aspirin 81 MG chewable tablet, Chew 81 mg by mouth daily., Disp: , Rfl:    atorvastatin (LIPITOR) 80 MG tablet, Take 80 mg by mouth daily., Disp: , Rfl:    AURYXIA 1 GM 210 MG(Fe) tablet, Take 420 mg by mouth 3 (three) times daily. Take with each meal and snack, Disp: ,  Rfl:    calcium carbonate (TUMS - DOSED IN MG ELEMENTAL CALCIUM) 500 MG chewable tablet, Chew 1 tablet by mouth 2 (two) times daily with a meal., Disp: , Rfl:    Cholecalciferol (VITAMIN D) 50 MCG (2000 UT) tablet, Take 2,000 Units by mouth daily., Disp: , Rfl:    cyclobenzaprine (FLEXERIL) 10 MG tablet, Take 1 tablet by mouth 2 (two) times daily as needed., Disp: , Rfl:    metoprolol tartrate (LOPRESSOR) 25 MG tablet, Take 25 mg by mouth daily., Disp: , Rfl:    midodrine (PROAMATINE) 5 MG tablet, Take 3 tablets (15 mg total) by mouth 3 (three) times daily with meals., Disp: 180 tablet, Rfl: 1   Multiple Vitamins-Minerals (MULTIVITAMIN WITH MINERALS) tablet, Take 1 tablet by mouth daily., Disp: , Rfl:    ondansetron (ZOFRAN) 4 MG tablet, Take 1 tablet (4 mg total) by mouth every 8 (eight) hours as needed for up to 20 doses for nausea or vomiting., Disp: 20 tablet, Rfl: 0   oxyCODONE (ROXICODONE) 5 MG immediate release tablet, Take 1 tablet (5 mg total) by mouth every 8 (eight) hours as needed for up to 10 doses for severe pain., Disp: 10 tablet, Rfl: 0   pantoprazole (PROTONIX) 40 MG tablet, Take 1 tablet (40 mg total) by mouth 2 (two) times daily. (Patient taking differently: Take 40 mg by mouth daily.), Disp: 60 tablet, Rfl: 1   predniSONE (DELTASONE) 10 MG tablet, Take 10 mg by mouth daily with breakfast., Disp: , Rfl:    tacrolimus (PROGRAF) 0.5 MG capsule, Take 0.5 mg by mouth daily., Disp: , Rfl:    tamsulosin (FLOMAX) 0.4 MG CAPS capsule, Take 0.4 mg by mouth daily. 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    ESOPHAGOGASTRODUODENOSCOPY (EGD) WITH PROPOFOL N/A 03/31/2021   active bleeding in duodenum due to suspected AVM s/p hemostatic spray  GIVENS CAPSULE STUDY N/A 06/04/2021   Procedure: GIVENS CAPSULE STUDY;  Surgeon: Eloise Harman, DO;  [facility]: [facility];  Service: Endoscopy;  Laterality: N/A;  7:30am   HEMOSTASIS CONTROL  03/31/2021   Procedure: HEMOSTASIS CONTROL;  Surgeon: Thornton Park, MD;  Location: [facility];  Service: Gastroenterology;;   HERNIA MESH REMOVAL     abdominal   INCISION AND DRAINAGE OF WOUND Left 10/21/2022   Procedure: IRRIGATION AND DEBRIDEMENT WOUND left leg wound with placement of myriad;  Surgeon: [name], MD;  Location: [facility];  Service: Plastics;  Laterality: Left;   SMALL BOWEL ENTEROSCOPY     Normal stomach, normal duodenum, 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   TRANSPLANTATION RENAL  2005    Social History: Social History   Socioeconomic History   Marital status: Married    Spouse name: Not on file   Number of children: 4   Years of education: Not on file   Highest education level: Not on file  Occupational History   Occupation: partial disabled   Occupation: host  Tobacco Use   Smoking status: Never   Smokeless tobacco: Never  Vaping Use   Vaping Use: Never used  Substance and Sexual Activity   Alcohol use: No   Drug use: No   Sexual activity: Not on file  Other Topics Concern   Not on file  Social History Narrative   Not on file   Social Determinants of Health   Financial Resource Strain: Not on file  Food Insecurity: No Food Insecurity (08/27/2022)   Hunger Vital Sign    Worried About Running Out of Food in the  Last Year: Never true    Ran Out of Food in the Last Year: Never true  Transportation Needs: No Transportation Needs (08/27/2022)   PRAPARE - Hydrologist (Medical): No    Lack of Transportation (Non-Medical): No  Physical Activity: Not on file  Stress: Not on file  Social Connections: Not on file  Intimate Partner Violence: Not At Risk (08/27/2022)   Humiliation, Afraid, Rape, and Kick questionnaire    Fear of Current or Ex-Partner: No    Emotionally Abused: No    Physically Abused: No    Sexually Abused: No    Family History: Family History  Problem Relation Age of Onset   Hypertension Mother    Heart attack Father    Hypertension Maternal Grandmother    Stroke Maternal Grandfather    Heart attack Paternal Uncle    Heart attack Paternal Uncle    Heart attack Paternal Aunt    Stroke Maternal Uncle     Review of Systems: Denies fevers, chill, recent changes in health  Physical Exam: Vital Signs BP 99/68 (BP Location: Right Arm, Patient Position: Sitting, Cuff Size: Small)   Pulse 78   SpO2 100%   Physical Exam  Constitutional:      General: Not in acute distress.    Appearance: Normal appearance. Not ill-appearing.  HENT:     Head: Normocephalic and atraumatic.  Neck:     Musculoskeletal: Normal range of motion.  Cardiovascular:     Rate and Rhythm: Normal rate Pulmonary:     Effort: Pulmonary effort is normal. No respiratory distress.  Lower Extremities: Varicose veins noted, nonswollen. Medical shoe noted to LLE with dressings to LLE. Dressings are CDI.  Skin:    General: Skin is warm and dry.     Findings: No erythema or rash.  Neurological:      Mental Status: Alert and oriented to person, place, and time. Mental status is at baseline.  Psychiatric:        Mood and Affect: Mood normal.        Behavior: Behavior normal.    Assessment/Plan: The patient is scheduled for debridement in preparation of wound to the left foot,  placement of ACell and wound VAC with Dr. Lovena Le.  Risks, benefits, and alternatives of procedure discussed, questions answered and consent obtained.    Clearance is to be sent to patient's cardiology provider at [facility] and to patient's PCP, Dr. Vilinda Flake.   Smoking Status: Non-smoker; Counseling Given?  N/A  Caprini Score: 8; Risk Factors include: Age, history of DVT, varicose veins, BMI > 25, and length of planned surgery. Recommendation for mechanical prophylaxis and possible pharmacologic prophylaxis. Encourage early ambulation.  I will discuss with Dr. Lovena Le regarding the possibility of postoperative Lovenox for DVT prophylaxis.  Pictures obtained: 11/23/2022  Post-op Rx sent to pharmacy: Oxycodone, Zofran  I discussed with the patient that he should hold all multivitamins, vitamins and dietary supplements at least 1 week prior to surgery.  I discussed with the patient to hold his tacrolimus 1 week prior to surgery.  Patient states that he has held it in the past without issue.  I discussed with the patient that he should hold his Flexeril the day of surgery.  Patient expressed understanding.   Patient was provided with the General Surgical Risk consent document and Pain Medication Agreement prior to their appointment.  They had adequate time to read through the risk consent documents and Pain Medication Agreement. We also discussed them in person together during this preop appointment. All of their questions were answered to their satisfaction.  Recommended calling if they have any further questions.  Risk consent form and Pain Medication Agreement to be scanned into patient's chart.  The consent was obtained with risks and complications reviewed which included bleeding, pain, scar, infection and the risk of anesthesia.  The patients questions were answered to the patients expressed satisfaction.  I discussed with the patient that he may be at an elevated risk of complications  given his medical history.  Patient expressed understanding and states that he still wants to move forward with surgery.   Electronically signed by: Clance Boll, PA-C 11/25/2022 10:23 AM

## 2022-11-26 ENCOUNTER — Encounter: Payer: Self-pay | Admitting: Student

## 2022-11-26 NOTE — Progress Notes (Signed)
Surgical Clearance has been received from patient's PCP, Dr. Vilinda Flake, for patient's upcoming surgery with Dr. Lovena Le.  Per Dr. Manuella Ghazi, patient may hold Eliquis 3 days prior to surgery and Plavix 5 days prior to surgery.  Yesterday at patient's preop appointment, he reported that he was not taking any blood thinners at this time and had finished his Eliquis several days ago.  I called the patient today and confirmed with him that he is not taking either Eliquis or Plavix or any other blood thinners.  Patient states he is not taking either of those medications at this time and has not taking any sort of blood thinner at this time.

## 2022-11-26 NOTE — H&P (View-Only) (Signed)
Surgical Clearance has been received from patient's PCP, Dr. Vilinda Flake, for patient's upcoming surgery with Dr. Lovena Le.  Per Dr. Manuella Ghazi, patient may hold Eliquis 3 days prior to surgery and Plavix 5 days prior to surgery.  Yesterday at patient's preop appointment, he reported that he was not taking any blood thinners at this time and had finished his Eliquis several days ago.  I called the patient today and confirmed with him that he is not taking either Eliquis or Plavix or any other blood thinners.  Patient states he is not taking either of those medications at this time and has not taking any sort of blood thinner at this time.

## 2022-11-29 ENCOUNTER — Encounter: Payer: Medicare Other | Admitting: Physician Assistant

## 2022-11-29 ENCOUNTER — Encounter (HOSPITAL_COMMUNITY): Payer: Self-pay | Admitting: Plastic Surgery

## 2022-11-29 ENCOUNTER — Other Ambulatory Visit: Payer: Self-pay

## 2022-11-29 NOTE — Anesthesia Preprocedure Evaluation (Signed)
Anesthesia Evaluation  Patient identified by MRN, date of birth, ID band Patient awake    Reviewed: Allergy & Precautions, NPO status , Patient's Chart, lab work & pertinent test results  Airway Mallampati: II  TM Distance: >3 FB Neck ROM: Full    Dental  (+) Dental Advisory Given   Pulmonary sleep apnea and Continuous Positive Airway Pressure Ventilation    breath sounds clear to auscultation       Cardiovascular hypertension, Pt. on medications and Pt. on home beta blockers + CAD and +CHF  + dysrhythmias (s/p Watchman) Atrial Fibrillation  Rhythm:Regular Rate:Normal     Neuro/Psych negative neurological ROS     GI/Hepatic Neg liver ROS,GERD  ,,  Endo/Other  negative endocrine ROS    Renal/GU ESRF and DialysisRenal disease     Musculoskeletal   Abdominal   Peds  Hematology negative hematology ROS (+)   Anesthesia Other Findings   Reproductive/Obstetrics                             Anesthesia Physical Anesthesia Plan  ASA: 4  Anesthesia Plan: General   Post-op Pain Management: Tylenol PO (pre-op)* and Minimal or no pain anticipated   Induction: Intravenous  PONV Risk Score and Plan: 2 and Dexamethasone, Ondansetron and Treatment may vary due to age or medical condition  Airway Management Planned: LMA  Additional Equipment:   Intra-op Plan:   Post-operative Plan: Extubation in OR  Informed Consent: I have reviewed the patients History and Physical, chart, labs and discussed the procedure including the risks, benefits and alternatives for the proposed anesthesia with the patient or authorized representative who has indicated his/her understanding and acceptance.     Dental advisory given  Plan Discussed with: CRNA  Anesthesia Plan Comments: (  )       Anesthesia Quick Evaluation

## 2022-11-29 NOTE — Progress Notes (Signed)
Mr. Essick denies chest pain or shortness of breath.  Patient denies having any s/s of Covid in his household, also denies any known exposure to Covid.   Mr. Kliebert states that he is no longer taking Eliquis or Plavix and has not started ASA.  Mr. Piscopo has seen 3 groups of cardiologist in the past 5 year.  Mr. Cookston is currently being seen at [facility] in [place]. Patient was last seen 11/25/22. I have request last office visit.  Mr. Tutson had a DVT in Oct of 2023, that caused the leg wound.
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
## 2022-11-30 ENCOUNTER — Ambulatory Visit (HOSPITAL_BASED_OUTPATIENT_CLINIC_OR_DEPARTMENT_OTHER): Payer: Medicare Other | Admitting: Vascular Surgery

## 2022-11-30 ENCOUNTER — Encounter (HOSPITAL_COMMUNITY): Payer: Self-pay | Admitting: Plastic Surgery

## 2022-11-30 ENCOUNTER — Ambulatory Visit (HOSPITAL_COMMUNITY): Payer: Medicare Other | Admitting: Vascular Surgery

## 2022-11-30 ENCOUNTER — Encounter (HOSPITAL_COMMUNITY)
Admission: RE | Disposition: A | Discharge status: Home / Self Care | Payer: Self-pay | Source: Home / Self Care | Attending: Plastic Surgery

## 2022-11-30 ENCOUNTER — Other Ambulatory Visit: Payer: Self-pay

## 2022-11-30 ENCOUNTER — Ambulatory Visit (HOSPITAL_COMMUNITY)
Admission: RE | Admit: 2022-11-30 | Discharge: 2022-11-30 | Disposition: A | Discharge status: Home / Self Care | Payer: Medicare Other | Attending: Plastic Surgery | Admitting: Plastic Surgery

## 2022-11-30 ENCOUNTER — Encounter: Payer: Self-pay | Admitting: Student

## 2022-11-30 DIAGNOSIS — E785 Hyperlipidemia, unspecified: Secondary | ICD-10-CM | POA: Insufficient documentation

## 2022-11-30 DIAGNOSIS — Z940 Kidney transplant status: Secondary | ICD-10-CM | POA: Diagnosis not present

## 2022-11-30 DIAGNOSIS — Z86718 Personal history of other venous thrombosis and embolism: Secondary | ICD-10-CM | POA: Diagnosis not present

## 2022-11-30 DIAGNOSIS — Z955 Presence of coronary angioplasty implant and graft: Secondary | ICD-10-CM | POA: Insufficient documentation

## 2022-11-30 DIAGNOSIS — I2510 Atherosclerotic heart disease of native coronary artery without angina pectoris: Secondary | ICD-10-CM

## 2022-11-30 DIAGNOSIS — I272 Pulmonary hypertension, unspecified: Secondary | ICD-10-CM | POA: Insufficient documentation

## 2022-11-30 DIAGNOSIS — S91302A Unspecified open wound, left foot, initial encounter: Secondary | ICD-10-CM

## 2022-11-30 DIAGNOSIS — S96922D Laceration of unspecified muscle and tendon at ankle and foot level, left foot, subsequent encounter: Secondary | ICD-10-CM

## 2022-11-30 DIAGNOSIS — N186 End stage renal disease: Secondary | ICD-10-CM | POA: Insufficient documentation

## 2022-11-30 DIAGNOSIS — G4733 Obstructive sleep apnea (adult) (pediatric): Secondary | ICD-10-CM | POA: Insufficient documentation

## 2022-11-30 DIAGNOSIS — I4891 Unspecified atrial fibrillation: Secondary | ICD-10-CM | POA: Insufficient documentation

## 2022-11-30 DIAGNOSIS — S91302D Unspecified open wound, left foot, subsequent encounter: Secondary | ICD-10-CM

## 2022-11-30 DIAGNOSIS — Z79899 Other long term (current) drug therapy: Secondary | ICD-10-CM | POA: Diagnosis not present

## 2022-11-30 DIAGNOSIS — Z992 Dependence on renal dialysis: Secondary | ICD-10-CM

## 2022-11-30 DIAGNOSIS — I132 Hypertensive heart and chronic kidney disease with heart failure and with stage 5 chronic kidney disease, or end stage renal disease: Secondary | ICD-10-CM

## 2022-11-30 DIAGNOSIS — K219 Gastro-esophageal reflux disease without esophagitis: Secondary | ICD-10-CM | POA: Diagnosis not present

## 2022-11-30 DIAGNOSIS — I509 Heart failure, unspecified: Secondary | ICD-10-CM

## 2022-11-30 DIAGNOSIS — X58XXXA Exposure to other specified factors, initial encounter: Secondary | ICD-10-CM | POA: Insufficient documentation

## 2022-11-30 HISTORY — PX: APPLICATION OF WOUND VAC: SHX5189

## 2022-11-30 HISTORY — DX: Gastro-esophageal reflux disease without esophagitis: K21.9

## 2022-11-30 HISTORY — DX: Pulmonary hypertension, unspecified: I27.20

## 2022-11-30 HISTORY — PX: INCISION AND DRAINAGE OF WOUND: SHX1803

## 2022-11-30 LAB — POCT I-STAT, CHEM 8
BUN: 87 mg/dL — ABNORMAL HIGH (ref 6–20)
Calcium, Ion: 0.74 mmol/L — CL (ref 1.15–1.40)
Chloride: 101 mmol/L (ref 98–111)
Creatinine, Ser: 13.1 mg/dL — ABNORMAL HIGH (ref 0.61–1.24)
Glucose, Bld: 82 mg/dL (ref 70–99)
HCT: 43 % (ref 39.0–52.0)
Hemoglobin: 14.6 g/dL (ref 13.0–17.0)
Potassium: 5.3 mmol/L — ABNORMAL HIGH (ref 3.5–5.1)
Sodium: 139 mmol/L (ref 135–145)
TCO2: 27 mmol/L (ref 22–32)

## 2022-11-30 SURGERY — IRRIGATION AND DEBRIDEMENT WOUND
Anesthesia: General | Site: Leg Lower | Laterality: Left

## 2022-11-30 MED ORDER — SODIUM CHLORIDE 0.9 % IV SOLN: INTRAVENOUS | Status: DC | PRN

## 2022-11-30 MED ORDER — VASOPRESSIN 20 UNIT/ML IV SOLN
INTRAVENOUS | Status: DC | PRN
  Administered 2022-11-30 (×6): 1 [IU] via INTRAVENOUS

## 2022-11-30 MED ORDER — FENTANYL CITRATE (PF) 100 MCG/2ML IJ SOLN: 25.0000 ug | INTRAMUSCULAR | Status: DC | PRN

## 2022-11-30 MED ORDER — ORAL CARE MOUTH RINSE: 15.0000 mL | Freq: Once | OROMUCOSAL | Status: AC

## 2022-11-30 MED ORDER — PROMETHAZINE HCL 25 MG/ML IJ SOLN: 6.2500 mg | INTRAMUSCULAR | Status: DC | PRN

## 2022-11-30 MED ORDER — CHLORHEXIDINE GLUCONATE CLOTH 2 % EX PADS: 6.0000 | MEDICATED_PAD | Freq: Once | CUTANEOUS | Status: DC

## 2022-11-30 MED ORDER — CHLORHEXIDINE GLUCONATE 0.12 % MT SOLN
15.0000 mL | Freq: Once | OROMUCOSAL | Status: AC
  Administered 2022-11-30: 15 mL via OROMUCOSAL
  Filled 2022-11-30: qty 15

## 2022-11-30 MED ORDER — VASOPRESSIN 20 UNIT/ML IV SOLN
INTRAVENOUS | Status: AC
  Filled 2022-11-30: qty 1

## 2022-11-30 MED ORDER — LIDOCAINE 2% (20 MG/ML) 5 ML SYRINGE
INTRAMUSCULAR | Status: DC | PRN
  Administered 2022-11-30: 80 mg via INTRAVENOUS

## 2022-11-30 MED ORDER — LIDOCAINE-EPINEPHRINE 1 %-1:100000 IJ SOLN: INTRAMUSCULAR | Status: DC | PRN

## 2022-11-30 MED ORDER — VASOPRESSIN 20 UNIT/ML IV SOLN: INTRAVENOUS | Status: DC | PRN

## 2022-11-30 MED ORDER — MIDAZOLAM HCL 2 MG/2ML IJ SOLN
INTRAMUSCULAR | Status: AC
  Filled 2022-11-30: qty 2

## 2022-11-30 MED ORDER — DEXAMETHASONE SODIUM PHOSPHATE 10 MG/ML IJ SOLN
INTRAMUSCULAR | Status: DC | PRN
  Administered 2022-11-30: 5 mg via INTRAVENOUS

## 2022-11-30 MED ORDER — SODIUM CHLORIDE 0.9 % IV SOLN: INTRAVENOUS | Status: DC

## 2022-11-30 MED ORDER — 0.9 % SODIUM CHLORIDE (POUR BTL) OPTIME
TOPICAL | Status: DC | PRN
  Administered 2022-11-30: 1000 mL

## 2022-11-30 MED ORDER — PHENYLEPHRINE 80 MCG/ML (10ML) SYRINGE FOR IV PUSH (FOR BLOOD PRESSURE SUPPORT)
PREFILLED_SYRINGE | INTRAVENOUS | Status: DC | PRN
  Administered 2022-11-30 (×2): 80 ug via INTRAVENOUS
  Administered 2022-11-30 (×3): 160 ug via INTRAVENOUS
  Administered 2022-11-30 (×3): 80 ug via INTRAVENOUS

## 2022-11-30 MED ORDER — PROPOFOL 10 MG/ML IV BOLUS
INTRAVENOUS | Status: DC | PRN
  Administered 2022-11-30: 50 mg via INTRAVENOUS
  Administered 2022-11-30: 10 mg via INTRAVENOUS
  Administered 2022-11-30: 100 mg via INTRAVENOUS
  Administered 2022-11-30: 40 mg via INTRAVENOUS

## 2022-11-30 MED ORDER — ACETAMINOPHEN 500 MG PO TABS
1000.0000 mg | ORAL_TABLET | Freq: Once | ORAL | Status: AC
  Administered 2022-11-30: 1000 mg via ORAL
  Filled 2022-11-30: qty 2

## 2022-11-30 MED ORDER — FENTANYL CITRATE (PF) 250 MCG/5ML IJ SOLN
INTRAMUSCULAR | Status: DC | PRN
  Administered 2022-11-30: 25 ug via INTRAVENOUS

## 2022-11-30 MED ORDER — FENTANYL CITRATE (PF) 250 MCG/5ML IJ SOLN
INTRAMUSCULAR | Status: AC
  Filled 2022-11-30: qty 5

## 2022-11-30 MED ORDER — LIDOCAINE-EPINEPHRINE 1 %-1:100000 IJ SOLN
INTRAMUSCULAR | Status: AC
  Filled 2022-11-30: qty 1

## 2022-11-30 MED ORDER — ONDANSETRON HCL 4 MG/2ML IJ SOLN
INTRAMUSCULAR | Status: DC | PRN
  Administered 2022-11-30: 4 mg via INTRAVENOUS

## 2022-11-30 MED ORDER — CEFAZOLIN SODIUM-DEXTROSE 2-4 GM/100ML-% IV SOLN
2.0000 g | INTRAVENOUS | Status: DC
  Filled 2022-11-30: qty 100

## 2022-11-30 SURGICAL SUPPLY — 62 items
APPLICATOR COTTON TIP 6 STRL (MISCELLANEOUS) IMPLANT
APPLICATOR COTTON TIP 6IN STRL (MISCELLANEOUS)
BAG COUNTER SPONGE SURGICOUNT (BAG) ×1 IMPLANT
BAG DECANTER FOR FLEXI CONT (MISCELLANEOUS) IMPLANT
BENZOIN TINCTURE PRP APPL 2/3 (GAUZE/BANDAGES/DRESSINGS) ×1 IMPLANT
BLADE SURG 10 STRL SS (BLADE) IMPLANT
BNDG GAUZE DERMACEA FLUFF 4 (GAUZE/BANDAGES/DRESSINGS) IMPLANT
CANISTER SUCT 3000ML PPV (MISCELLANEOUS) ×1 IMPLANT
CNTNR URN SCR LID CUP LEK RST (MISCELLANEOUS) IMPLANT
CONT SPEC 4OZ STRL OR WHT (MISCELLANEOUS)
COVER SURGICAL LIGHT HANDLE (MISCELLANEOUS) ×1 IMPLANT
DRAIN CHANNEL 19F RND (DRAIN) IMPLANT
DRAIN JP 10F RND SILICONE (MISCELLANEOUS) IMPLANT
DRAPE HALF SHEET 40X57 (DRAPES) IMPLANT
DRAPE IMP U-DRAPE 54X76 (DRAPES) ×1 IMPLANT
DRAPE INCISE IOBAN 66X45 STRL (DRAPES) IMPLANT
DRAPE LAPAROSCOPIC ABDOMINAL (DRAPES) IMPLANT
DRAPE LAPAROTOMY 100X72 PEDS (DRAPES) ×1 IMPLANT
DRESSING PREVENA PLUS CUSTOM (GAUZE/BANDAGES/DRESSINGS) IMPLANT
DRSG ADAPTIC 3X8 NADH LF (GAUZE/BANDAGES/DRESSINGS) IMPLANT
DRSG CALCIUM ALGINATE 4X4 (GAUZE/BANDAGES/DRESSINGS) IMPLANT
DRSG CUTIMED SORBACT 7X9 (GAUZE/BANDAGES/DRESSINGS) IMPLANT
DRSG PREVENA PLUS CUSTOM (GAUZE/BANDAGES/DRESSINGS) ×1
DRSG VAC ATS LRG SENSATRAC (GAUZE/BANDAGES/DRESSINGS) IMPLANT
DRSG VAC ATS MED SENSATRAC (GAUZE/BANDAGES/DRESSINGS) IMPLANT
DRSG VAC ATS SM SENSATRAC (GAUZE/BANDAGES/DRESSINGS) IMPLANT
ELECT CAUTERY BLADE 6.4 (BLADE) IMPLANT
ELECT REM PT RETURN 9FT ADLT (ELECTROSURGICAL) ×1
ELECTRODE REM PT RTRN 9FT ADLT (ELECTROSURGICAL) ×1 IMPLANT
GAUZE PAD ABD 8X10 STRL (GAUZE/BANDAGES/DRESSINGS) IMPLANT
GAUZE SPONGE 4X4 12PLY STRL (GAUZE/BANDAGES/DRESSINGS) IMPLANT
GLOVE BIO SURGEON STRL SZ8 (GLOVE) ×1 IMPLANT
GLOVE BIOGEL M STRL SZ7.5 (GLOVE) IMPLANT
GLOVE BIOGEL PI IND STRL 8 (GLOVE) ×1 IMPLANT
GOWN STRL REUS W/ TWL LRG LVL3 (GOWN DISPOSABLE) ×3 IMPLANT
GOWN STRL REUS W/TWL LRG LVL3 (GOWN DISPOSABLE) ×3
GOWN STRL REUS W/TWL XL LVL3 (GOWN DISPOSABLE) ×1 IMPLANT
KIT BASIN OR (CUSTOM PROCEDURE TRAY) ×1 IMPLANT
KIT DRSG PREVENA PLUS 7DAY 125 (MISCELLANEOUS) IMPLANT
KIT TURNOVER KIT B (KITS) ×1 IMPLANT
MATRIX SURG PERF 6LAYER 7X10 (Tissue) IMPLANT
MICROMATRIX 500MG (Tissue) ×1 IMPLANT
NDL HYPO 25GX1X1/2 BEV (NEEDLE) ×1 IMPLANT
NEEDLE HYPO 25GX1X1/2 BEV (NEEDLE) ×1 IMPLANT
NS IRRIG 1000ML POUR BTL (IV SOLUTION) ×1 IMPLANT
PACK GENERAL/GYN (CUSTOM PROCEDURE TRAY) ×1 IMPLANT
PACK UNIVERSAL I (CUSTOM PROCEDURE TRAY) ×1 IMPLANT
PAD ARMBOARD 7.5X6 YLW CONV (MISCELLANEOUS) ×2 IMPLANT
SOLUTION PARTIC MCRMTRX 500MG (Tissue) IMPLANT
STAPLER VISISTAT 35W (STAPLE) ×1 IMPLANT
SURGILUBE 2OZ TUBE FLIPTOP (MISCELLANEOUS) IMPLANT
SUT MNCRL AB 3-0 PS2 27 (SUTURE) IMPLANT
SUT MNCRL AB 4-0 PS2 18 (SUTURE) IMPLANT
SUT MON AB 2-0 CT1 36 (SUTURE) IMPLANT
SUT MON AB 5-0 PS2 18 (SUTURE) IMPLANT
SUT VIC AB 5-0 PS2 18 (SUTURE) IMPLANT
SUT VICRYL 3 0 (SUTURE) IMPLANT
SWAB COLLECTION DEVICE MRSA (MISCELLANEOUS) IMPLANT
SWAB CULTURE ESWAB REG 1ML (MISCELLANEOUS) IMPLANT
SYR CONTROL 10ML LL (SYRINGE) ×1 IMPLANT
TOWEL GREEN STERILE (TOWEL DISPOSABLE) ×1 IMPLANT
UNDERPAD 30X36 HEAVY ABSORB (UNDERPADS AND DIAPERS) ×1 IMPLANT

## 2022-11-30 NOTE — Anesthesia Postprocedure Evaluation (Signed)
Anesthesia Post Note  Patient: [name]  Procedure(s) Performed: debridement and preparation of wound, left leg (Left: Leg Lower) APPLICATION OF SKIN SUBSTITUTE A-CELL (Left: Leg Lower) APPLICATION OF WOUND VAC (Left: Leg Lower)     Patient location during evaluation: PACU Anesthesia Type: General Level of consciousness: awake and patient cooperative Pain management: pain level controlled Vital Signs Assessment: post-procedure vital signs reviewed and stable Respiratory status: spontaneous breathing, nonlabored ventilation, respiratory function stable and patient connected to nasal cannula oxygen Cardiovascular status: blood pressure returned to baseline and stable Postop Assessment: no apparent nausea or vomiting Anesthetic complications: no   No notable events documented.  Last Vitals:  Vitals:   11/30/22 1600 11/30/22 1615  BP: 92/76 97/66  Pulse: 85   Resp: 16 16  Temp:    SpO2: 96% 98%    Last Pain:  Vitals:   11/30/22 1615  TempSrc:   PainSc: 0-No pain                 Marrah Vanevery

## 2022-11-30 NOTE — Discharge Instructions (Addendum)
Diet: High protein, low sugar.  Medications: Restart your aspirin tomorrow.  Wound care: We placed a wound matrix followed by a green Sorbact dressing which is secured with sutures. Over top we placed a purple sponge secured with tape and the Prevena wound VAC. Do not get the area wet.  If you have trouble with the wound VAC, please let us know. However, we will likely defer troubleshooting to [employer], the makers of the device.  Their phone number is [phone].  We have provided you with some additional VAC tape in the event there is a leak with the seal.    Activity: As tolerated. Wear boot when ambulating. Limit walking.   Please call the office should you have severe pain or swelling, surrounding redness or malodor, or if you are having any difficulty with the wound VAC.

## 2022-11-30 NOTE — Transfer of Care (Signed)
Immediate Anesthesia Transfer of Care Note  Patient: [name]  Procedure(s) Performed: debridement and preparation of wound, left leg (Left: Leg Lower) APPLICATION OF SKIN SUBSTITUTE A-CELL (Left: Leg Lower) APPLICATION OF WOUND VAC (Left: Leg Lower)  Patient Location: PACU  Anesthesia Type:General  Level of Consciousness: awake and alert   Airway & Oxygen Therapy: Patient Spontanous Breathing and Patient connected to nasal cannula oxygen  Post-op Assessment: Report given to RN and Post -op Vital signs reviewed and stable  Post vital signs: Reviewed and stable  Last Vitals:  Vitals Value Taken Time  BP 98/58 11/30/22 1549  Temp    Pulse 73 11/30/22 1552  Resp 13 11/30/22 1552  SpO2 96 % 11/30/22 1552  Vitals shown include unvalidated device data.  Last Pain:  Vitals:   11/30/22 1233  TempSrc:   PainSc: 0-No pain         Complications: No notable events documented.

## 2022-11-30 NOTE — Interval H&P Note (Signed)
History and Physical Interval Note: Pt met in the pre op area, no change in physical exam or indication for surgery. Surgical site marked with his assistance. All questions answered to his satisfaction. Will proceed with wound debridement and placement of tissue substitute at his request.  11/30/2022 2:06 PM  [name]  has presented today for surgery, with the diagnosis of Open wound leg with tendon involvment, left.  The various methods of treatment have been discussed with the patient and family. After consideration of risks, benefits and other options for treatment, the patient has consented to  Procedure(s): debridement and preparation of wound, left foot, 7 x10 cm, placement of A-cell and wound vac. (Left) APPLICATION OF SKIN SUBSTITUTE (Left) APPLICATION OF WOUND VAC (Left) as a surgical intervention.  The patient's history has been reviewed, patient examined, no change in status, stable for surgery.  I have reviewed the patient's chart and labs.  Questions were answered to the patient's satisfaction.     [name]

## 2022-11-30 NOTE — Progress Notes (Signed)
Surgical Clearance has been received from cardiologist provider at [facility], Ileene Musa, NP, for patient's upcoming surgery with Dr. Lovena Le.   Per Ileene Musa, NP, "Based on the note by Jacob Moores, would consider the patient at moderate risk for cardiovascular events peri-procedure. Continue metoprolol and ASA".

## 2022-11-30 NOTE — Interval H&P Note (Signed)
History and Physical Interval Note: Pt met in the pre op area. All questions answered, surgical site marked. Will proceed with wound debridement and placement of A cell at his request.  11/30/2022 2:05 PM  [name]  has presented today for surgery, with the diagnosis of Open wound leg with tendon involvment, left.  The various methods of treatment have been discussed with the patient and family. After consideration of risks, benefits and other options for treatment, the patient has consented to  Procedure(s): debridement and preparation of wound, left foot, 7 x10 cm, placement of A-cell and wound vac. (Left) APPLICATION OF SKIN SUBSTITUTE (Left) APPLICATION OF WOUND VAC (Left) as a surgical intervention.  The patient's history has been reviewed, patient examined, no change in status, stable for surgery.  I have reviewed the patient's chart and labs.  Questions were answered to the patient's satisfaction.     [name]

## 2022-11-30 NOTE — Op Note (Signed)
DATE OF OPERATION: 11/30/2022  LOCATION: [facility] Main operating Room  PREOPERATIVE DIAGNOSIS: Left anterior foot wound  POSTOPERATIVE DIAGNOSIS: Same  PROCEDURE: Left anterior foot wound debridement, wound preparation, placement of ACell, placement of wound VAC wound size 8 cm x 7 cm  SURGEON: Jeanann Lewandowsky MD  ASSISTANT: Krista Blue, primary, Donnamarie Rossetti  EBL: 10 cc  CONDITION: Stable  COMPLICATIONS: None  INDICATION: The patient, [name], is a 57 y.o. male born on Aug 18, 1966, is here for treatment a wound on the anterior aspect of his left foot with exposed flexor tendon.   PROCEDURE DETAILS:  The patient was seen prior to surgery and marked.  IV antibiotics were given. The patient was taken to the operating room and given a general anesthetic. A standard time out was performed and all information was confirmed by those in the room. SCDs were placed.   The left lower extremity was prepped and draped in usual sterile manner.  The wound was debrided sharply.  The anterior sheath of the flexor tendon was debrided.  There was granulation tissue on both the lateral and medial aspects of the tendon.  After completing the wound preparation 250 mg of powdered ACell was placed on the wound.  A 7 x 10 portion of ACell sheeting was trimmed to the size of the wound and sutured in place over the exposed tendon and the wound bed with a running 3-0 Monocryl suture.  The ACell was held in place with a portion of Sorbact which was also sutured in place with Monocryl sutures.  A Prevena wound VAC was trimmed to the size of the wound and placed over the top of the Sorbact and placed to 125 mm of suction. The patient was allowed to wake up and taken to recovery room in stable condition at the end of the case. The family was notified at the end of the case.   The advanced practice practitioner (APP) assisted throughout the case.  The APP was essential in retraction and counter traction when needed to  make the case progress smoothly.  This retraction and assistance made it possible to see the tissue plans for the procedure.  The assistance was needed for blood control, tissue re-approximation and assisted with closure of the incision site.

## 2022-11-30 NOTE — Progress Notes (Signed)
Notified [name] of last dose of beta blockers and current vitals (HR 60, BP 107/55). Will not give this am dose 11/30/22- instructed by Smith Robert to hold.

## 2022-11-30 NOTE — Anesthesia Procedure Notes (Signed)
Procedure Name: LMA Insertion Date/Time: 11/30/2022 3:30 PM  Performed by: Timoteo Expose, CRNAPre-anesthesia Checklist: Patient identified, Emergency Drugs available, Suction available, Patient being monitored and Timeout performed Patient Re-evaluated:Patient Re-evaluated prior to induction Oxygen Delivery Method: Circle system utilized Preoxygenation: Pre-oxygenation with 100% oxygen Induction Type: IV induction LMA: LMA inserted LMA Size: 4.0

## 2022-12-01 ENCOUNTER — Encounter (HOSPITAL_COMMUNITY): Payer: Self-pay | Admitting: Plastic Surgery

## 2022-12-01 ENCOUNTER — Telehealth: Payer: Self-pay | Admitting: *Deleted

## 2022-12-01 NOTE — Telephone Encounter (Signed)
E-mailed [employer] broker Form and recent Op Notes to [employer].  For wound vac for the patient.  Per Lyndee Leo Elenteny,PA-C//AB/CMA

## 2022-12-02 ENCOUNTER — Telehealth: Payer: Self-pay

## 2022-12-02 ENCOUNTER — Telehealth: Payer: Self-pay | Admitting: Plastic Surgery

## 2022-12-02 NOTE — Telephone Encounter (Signed)
Called and spoke with pt, he said is doing well. He said he noticed some drainage on the gauze below the wound vac tape, but has not noticed anymore since his previous call. He feels well after surgery. He reports the wound vac is functioning properly.  I asked him to call back with any changes/concerns. He has an appt on Tuesday next week. He feels okay to wait until then. He knows to call if anything changes so we can evaluate sooner.

## 2022-12-02 NOTE — Telephone Encounter (Signed)
Patient notes that he is feeling dampness below the wound vac and notes he does believe it to be blood. Patient requests a call back from provider

## 2022-12-02 NOTE — Telephone Encounter (Signed)
Patient left voicemail expressing concern of blood coming out from under his wound vac bandage. I called pt back, no answer; left voicemail advising pt not to take the wound vac off but to apply some gauze or an abd pad over it and secure it with tape to hold it in place. Adv that I would send a note to Dr. Lovena Le and his PA staff to inform them and someone would adv him by phone tonight or tomorrow. Adv if a provider wanted to have him come in tomorrow we would contact him to get him scheduled.

## 2022-12-06 ENCOUNTER — Telehealth (INDEPENDENT_AMBULATORY_CARE_PROVIDER_SITE_OTHER): Payer: Medicare Other | Admitting: Physician Assistant

## 2022-12-06 ENCOUNTER — Telehealth: Payer: Self-pay | Admitting: Physician Assistant

## 2022-12-06 DIAGNOSIS — S86902A Unspecified injury of unspecified muscle(s) and tendon(s) at lower leg level, left leg, initial encounter: Secondary | ICD-10-CM

## 2022-12-06 DIAGNOSIS — S91002A Unspecified open wound, left ankle, initial encounter: Secondary | ICD-10-CM

## 2022-12-06 DIAGNOSIS — S81802A Unspecified open wound, left lower leg, initial encounter: Secondary | ICD-10-CM

## 2022-12-06 DIAGNOSIS — S81002A Unspecified open wound, left knee, initial encounter: Secondary | ICD-10-CM

## 2022-12-06 DIAGNOSIS — S96902A Unspecified injury of unspecified muscle and tendon at ankle and foot level, left foot, initial encounter: Secondary | ICD-10-CM

## 2022-12-06 NOTE — Telephone Encounter (Signed)
Patient called in to the on-call service with concerns regarding his recent surgery for chronic LLE wound.    Patient underwent debridement and placement of Acell on 11/30/2022 by Dr. Lovena Le.  Sorbact was laid down on top of the wound matrix followed by a Prevena 7-day wound VAC.  He was prompted by the machine to remove it today.  Upon removal of the tape and sponge, they noted that the Sorbact was stuck to the sponge and came off, as well. They also reported malodor.  Provided patient was reassurance that the [place] had likely already incorporated. They will gently rinse the wound and then dress with Adaptic and KY jelly followed by gauze/Kerlix.   He denies any pain, swelling, or fevers.  Reasonable for outpatient follow-up tomorrow, as scheduled.  He will call back should he experience any new or worsening symptoms.

## 2022-12-06 NOTE — Telephone Encounter (Signed)
I spoke with Mr. Abdon, he is wound VAC shut off.  He attempted to call [employer] but they were unable to get the wound VAC functioning.  He is scheduled to see [place] tomorrow.  I instructed him to take off the wound VAC, leaving the Sorbact in place cover with K-Y jelly because in the bandage.  Will see him in our office tomorrow.  He will reach out if he has any questions.

## 2022-12-07 ENCOUNTER — Ambulatory Visit (INDEPENDENT_AMBULATORY_CARE_PROVIDER_SITE_OTHER): Payer: Medicare Other | Admitting: Plastic Surgery

## 2022-12-07 DIAGNOSIS — S96902D Unspecified injury of unspecified muscle and tendon at ankle and foot level, left foot, subsequent encounter: Secondary | ICD-10-CM

## 2022-12-07 DIAGNOSIS — S81802D Unspecified open wound, left lower leg, subsequent encounter: Secondary | ICD-10-CM

## 2022-12-07 DIAGNOSIS — S86902D Unspecified injury of unspecified muscle(s) and tendon(s) at lower leg level, left leg, subsequent encounter: Secondary | ICD-10-CM

## 2022-12-07 DIAGNOSIS — S81002D Unspecified open wound, left knee, subsequent encounter: Secondary | ICD-10-CM

## 2022-12-07 DIAGNOSIS — S91002D Unspecified open wound, left ankle, subsequent encounter: Secondary | ICD-10-CM

## 2022-12-07 NOTE — Progress Notes (Signed)
[name] returns today after placement of ACell on his left anterior ankle wound.  He had a wound VAC in place but this stopped working approximately 1 day ago.  He noted that he had significant drainage and smell from the wound.  He denies any fever or chills.  On examination today he has excellent granulation including granulation over the tendon which he has not had in the past.  I am very happy with the progress that he is made.  He will begin showering and washing the wound gently with soap and water.  He will apply silver alginate to the wound over the next 2 days will return to see me on Thursday so that I can evaluate the progress of the wound.  Will make adjustments to the dressings as needed at that time.

## 2022-12-09 ENCOUNTER — Ambulatory Visit (INDEPENDENT_AMBULATORY_CARE_PROVIDER_SITE_OTHER): Payer: Medicare Other | Admitting: Plastic Surgery

## 2022-12-09 VITALS — BP 90/66 | HR 75

## 2022-12-09 DIAGNOSIS — S91002D Unspecified open wound, left ankle, subsequent encounter: Secondary | ICD-10-CM

## 2022-12-09 DIAGNOSIS — S81002D Unspecified open wound, left knee, subsequent encounter: Secondary | ICD-10-CM

## 2022-12-09 DIAGNOSIS — S96902D Unspecified injury of unspecified muscle and tendon at ankle and foot level, left foot, subsequent encounter: Secondary | ICD-10-CM

## 2022-12-09 DIAGNOSIS — S86902D Unspecified injury of unspecified muscle(s) and tendon(s) at lower leg level, left leg, subsequent encounter: Secondary | ICD-10-CM

## 2022-12-09 DIAGNOSIS — S81802D Unspecified open wound, left lower leg, subsequent encounter: Secondary | ICD-10-CM

## 2022-12-10 ENCOUNTER — Telehealth: Payer: Self-pay

## 2022-12-10 NOTE — Telephone Encounter (Signed)
Sent wound care supply request to [facility] with confirmed receipt. Will send to scan.

## 2022-12-10 NOTE — Progress Notes (Signed)
[name] returns today for evaluation of his anterior foot wound on the left side.  Patient states that he has been doing much better has been washing the wound and dressing changes daily.  He is using a collagen impregnated dressing on his wound which seems to be helping with the granulation tissue.  On evaluation today the patient has a significant increase in granulation tissue especially granulation tissue above the exposed tendon.  There is far less exudate in the wound and there has been in the past.  Minimal debridement was performed in the office and a new dressing was placed.  He will continue daily dressing changes and daily showers with gentle washing of the wound.  Follow-up next week.

## 2022-12-14 ENCOUNTER — Encounter: Payer: Self-pay | Admitting: Plastic Surgery

## 2022-12-16 ENCOUNTER — Ambulatory Visit (INDEPENDENT_AMBULATORY_CARE_PROVIDER_SITE_OTHER): Payer: Medicare Other | Admitting: Physician Assistant

## 2022-12-16 ENCOUNTER — Encounter: Payer: Self-pay | Admitting: Plastic Surgery

## 2022-12-16 VITALS — BP 83/60 | HR 69 | Temp 98.9°F | Resp 20

## 2022-12-16 DIAGNOSIS — S91002D Unspecified open wound, left ankle, subsequent encounter: Secondary | ICD-10-CM

## 2022-12-16 DIAGNOSIS — S81002D Unspecified open wound, left knee, subsequent encounter: Secondary | ICD-10-CM

## 2022-12-16 NOTE — Progress Notes (Signed)
Patient is a 57 year old male with PMH of DVT with subsequent wound over left anterior ankle with exposed tendon s/p debridement and placement of ACell followed by wound VAC performed 11/30/2022 by Dr. Lovena Le who presents to clinic for postoperative follow-up.  He was last seen here in clinic on 12/09/2022.  At that time, there was considerable increase in granulation tissue, particularly over the exposed tendon.  Plan was for transition from the wound VAC to collagen impregnated dressings.  Today, patient is doing well.  He states that his wife who is an Therapist, sports x 8 years has been assisting with his dressing changes daily.  He applies a collagen dressing followed by moistened 4 x 4 gauze, Kerlix, and Ace wrap.  He has been pleased with his improvement.  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  He also inquires about clearance to be added to renal transplant list.  On exam, wound has excellent granulation and good coverage of the previously exposed tendon.  Wound measures 7 x 8.5 cm.  There is still an area of fibrinous exudate and depth at the 1 o'clock position of the wound over proximal aspect of tendon.  However, compared to photo obtained at last encounter there has been considerable improvement.  Dressing change well-tolerated.  No surrounding cellulitic changes.  No malodor.  Scant yellow slough noted on previous dressing.  Continue with current dressing changes.  He already has scheduled postop visit for next week.  Will likely extend his time off of work.  Similarly, not yet able to clear for him to be added to transplant list given that he has a long journey of wound healing at of him.  He understands that he may ultimately require a STSG.  Picture(s) obtained of the patient and placed in the chart were with the patient's or guardian's permission.

## 2022-12-23 ENCOUNTER — Ambulatory Visit (INDEPENDENT_AMBULATORY_CARE_PROVIDER_SITE_OTHER): Payer: Medicare Other | Admitting: Physician Assistant

## 2022-12-23 ENCOUNTER — Encounter: Payer: Self-pay | Admitting: Physician Assistant

## 2022-12-23 ENCOUNTER — Encounter: Payer: Self-pay | Admitting: Plastic Surgery

## 2022-12-23 VITALS — HR 83

## 2022-12-23 DIAGNOSIS — S86902D Unspecified injury of unspecified muscle(s) and tendon(s) at lower leg level, left leg, subsequent encounter: Secondary | ICD-10-CM

## 2022-12-23 DIAGNOSIS — S81002D Unspecified open wound, left knee, subsequent encounter: Secondary | ICD-10-CM | POA: Diagnosis not present

## 2022-12-23 DIAGNOSIS — S96902D Unspecified injury of unspecified muscle and tendon at ankle and foot level, left foot, subsequent encounter: Secondary | ICD-10-CM

## 2022-12-23 DIAGNOSIS — S91002D Unspecified open wound, left ankle, subsequent encounter: Secondary | ICD-10-CM

## 2022-12-23 DIAGNOSIS — S81802D Unspecified open wound, left lower leg, subsequent encounter: Secondary | ICD-10-CM

## 2022-12-23 NOTE — Progress Notes (Signed)
[place] [id number] [place] [address]   CC:  Chief Complaint  Patient presents with   Post-op Follow-up      [name] is an 57 y.o. male.  HPI: Patient is a 57 year old male with PMH of DVT with subsequent wound over left anterior ankle with exposed tendon s/p debridement and placement of ACell followed by wound VAC performed 11/30/2022 by Dr. Lovena Le who presents to clinic for postoperative follow-up.   He was last seen here in clinic on 12/16/2022.  At that time, wound measured 7 x 8.5 cm.  There was excellent granulation, but still a small area of fibrinous exudate at the 1 o'clock position of the wound over proximal aspect of the tendon.  Plan was for continued dressing changes and repeat follow-up in 1 week.  Given that he works on his feet at [employer], discussed extending his time off of work.  Similarly, not yet able to clear him to be added to transplant list given extensive wound healing at home.  Today, patient is doing well.  He states that his wife continues to assist with dressing changes daily.  He uses a silver infused collagen dressing followed by wet-to-dry.  He feels as though it is getting better and is looking forward to the next phase of his reconstruction.  Considering STSG to help speed up his healing process.  He reports that he is at the top of the renal transplant list, however requires clearance.  He tells me that once he gets the thumbs up from last, they will move forward with the renal transplant which would involve him being placed on multiple immunosuppressive medications.  He also is requesting extension on short-term disability given that he is on his feet all day at [employer].   Allergies  Allergen Reactions   Vancomycin Swelling    Outpatient Encounter Medications as of 12/23/2022  Medication Sig Note   acetaminophen (TYLENOL) 500 MG tablet Take 1,000 mg by mouth every 6 (six) hours as needed (pain.).     allopurinol (ZYLOPRIM) 100 MG tablet Take 100 mg by mouth in the morning.    amoxicillin (AMOXIL) 500 MG capsule Take 2,000 mg by mouth See admin instructions. Take 4 capsules (2000 mg) by mouth 1 hour prior to dental appointments. 11/26/2022: Before dental appointment   Ascorbic Acid (VITAMIN C PO) Take 500 mg by mouth in the morning. 11/26/2022: On hold due to upcoming procedure   aspirin EC 81 MG tablet Take 81 mg by mouth in the morning. 11/26/2022: On hold per patient due to upcoming procedure.   atorvastatin (LIPITOR) 80 MG tablet Take 80 mg by mouth at bedtime.    calcium carbonate (TUMS EX) 750 MG chewable tablet Chew 2 tablets by mouth at bedtime.    Cholecalciferol (VITAMIN D) 50 MCG (2000 UT) tablet Take 2,000 Units by mouth in the morning. 11/26/2022: On hold due to upcoming procedure    cyclobenzaprine (FLEXERIL) 10 MG tablet Take 10 mg by mouth 2 (two) times daily as needed for muscle spasms.    famotidine (PEPCID) 20 MG tablet Take 20 mg by mouth at bedtime.    metoprolol tartrate (LOPRESSOR) 25 MG tablet Take 12.5 mg by mouth 2 (two) times daily.    midodrine (PROAMATINE) 5 MG tablet Take 3 tablets (15 mg total) by mouth 3 (three) times daily with meals. (Patient taking differently: Take 15-20 mg by mouth See admin instructions. Take 4 tablets (20  mg) by mouth in the morning before dialysis on Mondays, Wednesday & Fridays and Take 3 tablets (15 mg) by mouth during dialysis sessions on Mondays, Wednesdays & Fridays.)    Multiple Vitamin (MULTIVITAMIN WITH MINERALS) TABS tablet Take 1 tablet by mouth in the morning. 11/26/2022: On hold due to upcoming procedure    pantoprazole (PROTONIX) 40 MG tablet Take 1 tablet (40 mg total) by mouth 2 (two) times daily. (Patient taking differently: Take 40 mg by mouth daily before breakfast.)    predniSONE (DELTASONE) 10 MG tablet Take 10 mg by mouth at bedtime. 12/16/2022: Pt states he is taking 1/4 tab- his doctor is weaning him off   tacrolimus  (PROGRAF) 0.5 MG capsule Take 0.5 mg by mouth in the morning. 12/16/2022: PT STATES NOT TAKING   tamsulosin (FLOMAX) 0.4 MG CAPS capsule Take 0.4 mg by mouth every evening.    No facility-administered encounter medications on file as of 12/23/2022.     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    ESOPHAGOGASTRODUODENOSCOPY (EGD) WITH PROPOFOL N/A 03/31/2021   active bleeding in duodenum due to suspected AVM s/p hemostatic spray   GIVENS CAPSULE STUDY N/A 06/04/2021   Procedure: GIVENS CAPSULE STUDY;  Surgeon: Eloise Harman, DO;  [facility];  Service: Endoscopy;  Laterality: N/A;  7:30am   HEMOSTASIS CONTROL  03/31/2021   Procedure: HEMOSTASIS CONTROL;  Surgeon: Thornton Park, MD;  Location: [place];  Service: Gastroenterology;;   HERNIA MESH REMOVAL     abdominal   INCISION AND DRAINAGE OF WOUND Left 10/21/2022   Procedure: IRRIGATION AND DEBRIDEMENT WOUND left leg wound with placement of myriad;  Surgeon: [name], MD;  Location: [place];  Service: Plastics;   Laterality: Left;   INCISION AND DRAINAGE OF WOUND Left 11/30/2022   Procedure: debridement and preparation of wound, left leg;  Surgeon: [name], MD;  Location: [place];  Service: Plastics;  Laterality: Left;   LEFT ATRIAL APPENDAGE OCCLUSION  09/10/2021   SMALL BOWEL ENTEROSCOPY     Normal stomach, normal duodenum, 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   TRANSPLANTATION RENAL  2005    Family History  Problem Relation Age of Onset   Hypertension Mother    Heart attack Father    Hypertension Maternal Grandmother    Stroke Maternal Grandfather    Heart attack Paternal Uncle    Heart attack Paternal Uncle    Heart attack Paternal Aunt  Stroke Maternal Uncle     Social History   Social History Narrative   Not on file     Review of Systems General: Denies fevers or chills Skin: Denies any concerning drainage, surrounding redness, or worsening pain  Physical Exam    12/16/2022    1:20 PM 12/09/2022    3:01 PM 11/30/2022    4:15 PM  Vitals with BMI  Systolic 83 90 97  Diastolic 60 66 66  Pulse 69 75     General:  No acute distress, nontoxic appearing  Respiratory: No increased work of breathing Neuro: Alert and oriented Psychiatric: Normal mood and affect  Skin: Wound remains 8.5 x 7 cm.  No wound shrinkage since last encounter.  However, continued healthy granulation, particularly at the 1 o'clock position where there was more depth and fibrinous exudate at previous encounter.  Single running suture removed from periphery of wound without complication or difficulty.  Assessment/Plan  Chronic left lower extremity wound: Continue with collagen dressing changes daily.  Placed Adaptic and K-Y jelly today followed by 4 x 4 gauze, Kerlix, and Ace wrap.  Continue to elevate the leg whenever possible.  Discussed extension of his short-term disability until the end of the month, 01/13/2023.  He will talk with his management staff to see if he can have work accommodations  allowing him to elevate his leg and not be standing all day.  Concerned about dependent edema from prolonged standing putting stress on his wound healing.  Given that he would have to be placed on multiple immunosuppressive medications in order to go through with renal transplant, do not feel as though we can clear him at this time without compromising his wound healing and potentially causing setbacks.  He is understanding and agreeable.  Will have him follow-up in 2 weeks for reevaluation and to speak with Dr. Lovena Le about possible STSG.  Picture(s) obtained of the patient and placed in the chart were with the patient's or guardian's permission.   Krista Blue 12/23/2022, 12:56 PM

## 2023-01-05 NOTE — Progress Notes (Signed)
[place] [id number] [place] [address]   CC:  Chief Complaint  Patient presents with   Post-op Follow-up      [name] is an 57 y.o. male.  HPI: Patient is a 57 year old male with PMH of DVT with subsequent wound over left anterior ankle with exposed tendon s/p debridement and placement of ACell followed by wound VAC performed 11/30/2022 by Dr. Lovena Le who presents to clinic for postoperative follow-up.   He was last seen here in clinic on 12/23/2022.  At that time, reported that his wife was continue assist with dressing changes daily.  He is using a silver infused collagen dressing followed by wet-to-dry.  On exam, wound was noted to be 8.5 x 7 cm.  Healthy appearing granulation, particularly at the 1 o'clock position where there had been more depth and fibrinous exudate at previous encounter.  Plan was for continued dressing changes.  Given that he would have to be placed on multiple immunosuppressive medications in order to go through with the renal transplant, he was not cleared from our standpoint.  He was agreeable to this decision.  Plan was for follow-up in 2 weeks for reevaluation and to discuss STSG.  Today, he is doing okay without complaints.  He has been continuing with a daily collagen dressing changes followed by moistened gauze and dry gauze, secured with Kerlix and Ace wrap.  He has been ambulating using a cam walker.  Denies any significant pain, drainage, swelling, or other issues.  He states that he is being tapered off of his steroids and only takes three 10 mg pills per week.  He is eagerly looking forward to having this wound healed so that he can be cleared for renal transplant list.   Allergies  Allergen Reactions   Vancomycin Swelling    Outpatient Encounter Medications as of 01/06/2023  Medication Sig Note   acetaminophen (TYLENOL) 500 MG tablet Take 1,000 mg by mouth every 6 (six) hours as needed (pain.).     allopurinol (ZYLOPRIM) 100 MG tablet Take 100 mg by mouth in the morning.    amoxicillin (AMOXIL) 500 MG capsule Take 2,000 mg by mouth See admin instructions. Take 4 capsules (2000 mg) by mouth 1 hour prior to dental appointments. 11/26/2022: Before dental appointment   Ascorbic Acid (VITAMIN C PO) Take 500 mg by mouth in the morning. 11/26/2022: On hold due to upcoming procedure   aspirin EC 81 MG tablet Take 81 mg by mouth in the morning. 11/26/2022: On hold per patient due to upcoming procedure.   atorvastatin (LIPITOR) 80 MG tablet Take 80 mg by mouth at bedtime.    calcium carbonate (TUMS EX) 750 MG chewable tablet Chew 2 tablets by mouth at bedtime.    Cholecalciferol (VITAMIN D) 50 MCG (2000 UT) tablet Take 2,000 Units by mouth in the morning. 11/26/2022: On hold due to upcoming procedure    cyclobenzaprine (FLEXERIL) 10 MG tablet Take 10 mg by mouth 2 (two) times daily as needed for muscle spasms.    famotidine (PEPCID) 20 MG tablet Take 20 mg by mouth at bedtime.    metoprolol tartrate (LOPRESSOR) 25 MG tablet Take 12.5 mg by mouth 2 (two) times daily.    midodrine (PROAMATINE) 5 MG tablet Take 3 tablets (15 mg total) by mouth 3 (three) times daily with meals. (Patient taking differently: Take 15-20 mg by mouth See admin instructions. Take 4 tablets (20 mg) by mouth in the morning  before dialysis on Mondays, Wednesday & Fridays and Take 3 tablets (15 mg) by mouth during dialysis sessions on Mondays, Wednesdays & Fridays.)    Multiple Vitamin (MULTIVITAMIN WITH MINERALS) TABS tablet Take 1 tablet by mouth in the morning. 11/26/2022: On hold due to upcoming procedure    pantoprazole (PROTONIX) 40 MG tablet Take 1 tablet (40 mg total) by mouth 2 (two) times daily. (Patient taking differently: Take 40 mg by mouth daily before breakfast.)    predniSONE (DELTASONE) 10 MG tablet Take 10 mg by mouth at bedtime. 12/16/2022: Pt states he is taking 1/4 tab- his doctor is weaning him off   tamsulosin (FLOMAX)  0.4 MG CAPS capsule Take 0.4 mg by mouth every evening.    [DISCONTINUED] tacrolimus (PROGRAF) 0.5 MG capsule Take 0.5 mg by mouth in the morning. 12/16/2022: PT STATES NOT TAKING   No facility-administered encounter medications on file as of 01/06/2023.     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    ESOPHAGOGASTRODUODENOSCOPY (EGD) WITH PROPOFOL N/A 03/31/2021   active bleeding in duodenum due to suspected AVM s/p hemostatic spray   GIVENS CAPSULE STUDY N/A 06/04/2021   Procedure: GIVENS CAPSULE STUDY;  Surgeon: Eloise Harman, DO;  [facility];  Service: Endoscopy;  Laterality: N/A;  7:30am   HEMOSTASIS CONTROL  03/31/2021   Procedure: HEMOSTASIS CONTROL;  Surgeon: Thornton Park, MD;  Location: [place];  Service: Gastroenterology;;   HERNIA MESH REMOVAL     abdominal   INCISION AND DRAINAGE OF WOUND Left 10/21/2022   Procedure: IRRIGATION AND DEBRIDEMENT WOUND left leg wound with placement of myriad;  Surgeon: [name], MD;  Location: [place];  Service: Plastics;   Laterality: Left;   INCISION AND DRAINAGE OF WOUND Left 11/30/2022   Procedure: debridement and preparation of wound, left leg;  Surgeon: [name], MD;  Location: [facility];  Service: Plastics;  Laterality: Left;   LEFT ATRIAL APPENDAGE OCCLUSION  09/10/2021   SMALL BOWEL ENTEROSCOPY     Normal stomach, normal duodenum, 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   TRANSPLANTATION RENAL  2005    Family History  Problem Relation Age of Onset   Hypertension Mother    Heart attack Father    Hypertension Maternal Grandmother    Stroke Maternal Grandfather    Heart attack Paternal Uncle    Heart attack Paternal Uncle    Heart attack Paternal Aunt    Stroke Maternal Uncle  Social History   Social History Narrative   Not on file     Review of Systems General: Denies fevers or chills MSK: Denies swelling or inability to ambulate Skin: Denies malodor, drainage, or surrounding redness   Physical Exam    01/06/2023   10:53 AM 12/23/2022    1:04 PM 12/16/2022    1:20 PM  Vitals with BMI  Systolic 99991111  83  Diastolic 79  60  Pulse 57 83 69    General:  No acute distress, nontoxic appearing  Respiratory: No increased work of breathing Neuro: Alert and oriented Psychiatric: Normal mood and affect  Skin: 8 x 6.25 cm wound reflecting minimal wound shrinkage compared to previous encounter.  Granulation appears to be relatively unchanged.  Tendon is still covered.  No drainage or malodor.  Assessment/Plan  Chronic left lower extremity wound: Discussed case with Dr. Lovena Le personally evaluated patient at bedside.  Will plan to proceed with STSG and attempt to expedite recovery.  Discussed risks and benefits with patient and he would like to proceed.  Discussed that we will use a wound VAC at time of STSG in efforts to optimize outcome.  Continue with collagen dressing changes in interim.  Contacting surgical scheduler to find time on Dr. Tanna Furry OR schedule.  Picture(s) obtained of the  patient and placed in the chart were with the patient's or guardian's permission.   Krista Blue 01/06/2023, 10:59 AM

## 2023-01-06 ENCOUNTER — Telehealth: Payer: Self-pay | Admitting: *Deleted

## 2023-01-06 ENCOUNTER — Ambulatory Visit (INDEPENDENT_AMBULATORY_CARE_PROVIDER_SITE_OTHER): Payer: Medicare Other | Admitting: Physician Assistant

## 2023-01-06 ENCOUNTER — Telehealth: Payer: Self-pay | Admitting: Plastic Surgery

## 2023-01-06 ENCOUNTER — Encounter: Payer: Medicare Other | Admitting: Physician Assistant

## 2023-01-06 ENCOUNTER — Encounter: Payer: Self-pay | Admitting: Plastic Surgery

## 2023-01-06 VITALS — BP 116/79 | HR 57

## 2023-01-06 DIAGNOSIS — S81802D Unspecified open wound, left lower leg, subsequent encounter: Secondary | ICD-10-CM | POA: Diagnosis not present

## 2023-01-06 DIAGNOSIS — S96902D Unspecified injury of unspecified muscle and tendon at ankle and foot level, left foot, subsequent encounter: Secondary | ICD-10-CM

## 2023-01-06 DIAGNOSIS — S91002D Unspecified open wound, left ankle, subsequent encounter: Secondary | ICD-10-CM

## 2023-01-06 DIAGNOSIS — S86902D Unspecified injury of unspecified muscle(s) and tendon(s) at lower leg level, left leg, subsequent encounter: Secondary | ICD-10-CM

## 2023-01-06 NOTE — Telephone Encounter (Signed)
Pt had a visit today 01-06-23 and called back to ask if more medical records could be sent to [facility] for him. He stated that the letter he received was not enough for them.

## 2023-01-06 NOTE — Telephone Encounter (Signed)
Pending Auth: [id number] for CPT [place], [place], [place], [place], [id number]

## 2023-01-10 ENCOUNTER — Telehealth: Payer: Self-pay

## 2023-01-10 ENCOUNTER — Other Ambulatory Visit: Payer: Self-pay

## 2023-01-10 ENCOUNTER — Emergency Department (HOSPITAL_COMMUNITY): Payer: Medicare Other

## 2023-01-10 ENCOUNTER — Encounter (HOSPITAL_COMMUNITY): Payer: Self-pay | Admitting: *Deleted

## 2023-01-10 ENCOUNTER — Inpatient Hospital Stay (HOSPITAL_COMMUNITY)
Admission: EM | Admit: 2023-01-10 | Discharge: 2023-01-25 | DRG: 871 | Disposition: A | Discharge status: Home Health | Payer: Medicare Other | Attending: Internal Medicine | Admitting: Internal Medicine

## 2023-01-10 DIAGNOSIS — I70248 Atherosclerosis of native arteries of left leg with ulceration of other part of lower left leg: Secondary | ICD-10-CM | POA: Diagnosis present

## 2023-01-10 DIAGNOSIS — L03115 Cellulitis of right lower limb: Secondary | ICD-10-CM

## 2023-01-10 DIAGNOSIS — R6521 Severe sepsis with septic shock: Secondary | ICD-10-CM | POA: Diagnosis present

## 2023-01-10 DIAGNOSIS — I272 Pulmonary hypertension, unspecified: Secondary | ICD-10-CM | POA: Diagnosis present

## 2023-01-10 DIAGNOSIS — Z823 Family history of stroke: Secondary | ICD-10-CM

## 2023-01-10 DIAGNOSIS — Z955 Presence of coronary angioplasty implant and graft: Secondary | ICD-10-CM

## 2023-01-10 DIAGNOSIS — R7881 Bacteremia: Principal | ICD-10-CM

## 2023-01-10 DIAGNOSIS — S81802A Unspecified open wound, left lower leg, initial encounter: Secondary | ICD-10-CM | POA: Diagnosis not present

## 2023-01-10 DIAGNOSIS — I480 Paroxysmal atrial fibrillation: Secondary | ICD-10-CM | POA: Diagnosis not present

## 2023-01-10 DIAGNOSIS — Z7952 Long term (current) use of systemic steroids: Secondary | ICD-10-CM

## 2023-01-10 DIAGNOSIS — I471 Supraventricular tachycardia, unspecified: Secondary | ICD-10-CM | POA: Diagnosis not present

## 2023-01-10 DIAGNOSIS — A419 Sepsis, unspecified organism: Principal | ICD-10-CM | POA: Diagnosis present

## 2023-01-10 DIAGNOSIS — I70238 Atherosclerosis of native arteries of right leg with ulceration of other part of lower right leg: Secondary | ICD-10-CM | POA: Diagnosis present

## 2023-01-10 DIAGNOSIS — D631 Anemia in chronic kidney disease: Secondary | ICD-10-CM | POA: Diagnosis present

## 2023-01-10 DIAGNOSIS — Z7982 Long term (current) use of aspirin: Secondary | ICD-10-CM

## 2023-01-10 DIAGNOSIS — I959 Hypotension, unspecified: Secondary | ICD-10-CM | POA: Diagnosis present

## 2023-01-10 DIAGNOSIS — N2581 Secondary hyperparathyroidism of renal origin: Secondary | ICD-10-CM | POA: Diagnosis present

## 2023-01-10 DIAGNOSIS — I2510 Atherosclerotic heart disease of native coronary artery without angina pectoris: Secondary | ICD-10-CM | POA: Diagnosis present

## 2023-01-10 DIAGNOSIS — L97829 Non-pressure chronic ulcer of other part of left lower leg with unspecified severity: Secondary | ICD-10-CM | POA: Diagnosis present

## 2023-01-10 DIAGNOSIS — Z79899 Other long term (current) drug therapy: Secondary | ICD-10-CM

## 2023-01-10 DIAGNOSIS — I4891 Unspecified atrial fibrillation: Secondary | ICD-10-CM | POA: Diagnosis not present

## 2023-01-10 DIAGNOSIS — Z992 Dependence on renal dialysis: Secondary | ICD-10-CM

## 2023-01-10 DIAGNOSIS — G4733 Obstructive sleep apnea (adult) (pediatric): Secondary | ICD-10-CM | POA: Diagnosis present

## 2023-01-10 DIAGNOSIS — I340 Nonrheumatic mitral (valve) insufficiency: Secondary | ICD-10-CM | POA: Diagnosis not present

## 2023-01-10 DIAGNOSIS — N186 End stage renal disease: Secondary | ICD-10-CM

## 2023-01-10 DIAGNOSIS — I132 Hypertensive heart and chronic kidney disease with heart failure and with stage 5 chronic kidney disease, or end stage renal disease: Secondary | ICD-10-CM | POA: Diagnosis present

## 2023-01-10 DIAGNOSIS — I771 Stricture of artery: Secondary | ICD-10-CM | POA: Diagnosis not present

## 2023-01-10 DIAGNOSIS — I4719 Other supraventricular tachycardia: Secondary | ICD-10-CM | POA: Diagnosis not present

## 2023-01-10 DIAGNOSIS — L03116 Cellulitis of left lower limb: Secondary | ICD-10-CM | POA: Diagnosis present

## 2023-01-10 DIAGNOSIS — I361 Nonrheumatic tricuspid (valve) insufficiency: Secondary | ICD-10-CM | POA: Diagnosis not present

## 2023-01-10 DIAGNOSIS — Z95818 Presence of other cardiac implants and grafts: Secondary | ICD-10-CM | POA: Diagnosis not present

## 2023-01-10 DIAGNOSIS — K219 Gastro-esophageal reflux disease without esophagitis: Secondary | ICD-10-CM | POA: Diagnosis present

## 2023-01-10 DIAGNOSIS — Z86718 Personal history of other venous thrombosis and embolism: Secondary | ICD-10-CM | POA: Diagnosis not present

## 2023-01-10 DIAGNOSIS — R6520 Severe sepsis without septic shock: Secondary | ICD-10-CM

## 2023-01-10 DIAGNOSIS — Z8249 Family history of ischemic heart disease and other diseases of the circulatory system: Secondary | ICD-10-CM

## 2023-01-10 DIAGNOSIS — L97819 Non-pressure chronic ulcer of other part of right lower leg with unspecified severity: Secondary | ICD-10-CM | POA: Diagnosis present

## 2023-01-10 DIAGNOSIS — I5032 Chronic diastolic (congestive) heart failure: Secondary | ICD-10-CM | POA: Diagnosis present

## 2023-01-10 DIAGNOSIS — M898X9 Other specified disorders of bone, unspecified site: Secondary | ICD-10-CM | POA: Diagnosis present

## 2023-01-10 DIAGNOSIS — R509 Fever, unspecified: Secondary | ICD-10-CM | POA: Diagnosis present

## 2023-01-10 DIAGNOSIS — I950 Idiopathic hypotension: Secondary | ICD-10-CM

## 2023-01-10 DIAGNOSIS — I4819 Other persistent atrial fibrillation: Secondary | ICD-10-CM

## 2023-01-10 DIAGNOSIS — Z1152 Encounter for screening for COVID-19: Secondary | ICD-10-CM | POA: Diagnosis not present

## 2023-01-10 DIAGNOSIS — E785 Hyperlipidemia, unspecified: Secondary | ICD-10-CM | POA: Diagnosis not present

## 2023-01-10 DIAGNOSIS — E876 Hypokalemia: Secondary | ICD-10-CM | POA: Diagnosis not present

## 2023-01-10 DIAGNOSIS — Z881 Allergy status to other antibiotic agents status: Secondary | ICD-10-CM

## 2023-01-10 DIAGNOSIS — I9589 Other hypotension: Secondary | ICD-10-CM | POA: Diagnosis present

## 2023-01-10 DIAGNOSIS — E782 Mixed hyperlipidemia: Secondary | ICD-10-CM | POA: Diagnosis present

## 2023-01-10 DIAGNOSIS — L98499 Non-pressure chronic ulcer of skin of other sites with unspecified severity: Secondary | ICD-10-CM

## 2023-01-10 DIAGNOSIS — Z7901 Long term (current) use of anticoagulants: Secondary | ICD-10-CM

## 2023-01-10 HISTORY — DX: Chronic diastolic (congestive) heart failure: I50.32

## 2023-01-10 HISTORY — DX: End stage renal disease: N18.6

## 2023-01-10 HISTORY — DX: Paroxysmal atrial fibrillation: I48.0

## 2023-01-10 LAB — CBC WITH DIFFERENTIAL/PLATELET
Abs Immature Granulocytes: 0.03 10*3/uL (ref 0.00–0.07)
Basophils Absolute: 0.1 10*3/uL (ref 0.0–0.1)
Basophils Relative: 1 %
Eosinophils Absolute: 0.8 10*3/uL — ABNORMAL HIGH (ref 0.0–0.5)
Eosinophils Relative: 7 %
HCT: 45.3 % (ref 39.0–52.0)
Hemoglobin: 13.4 g/dL (ref 13.0–17.0)
Immature Granulocytes: 0 %
Lymphocytes Relative: 16 %
Lymphs Abs: 1.9 10*3/uL (ref 0.7–4.0)
MCH: 28.8 pg (ref 26.0–34.0)
MCHC: 29.6 g/dL — ABNORMAL LOW (ref 30.0–36.0)
MCV: 97.4 fL (ref 80.0–100.0)
Monocytes Absolute: 1.5 10*3/uL — ABNORMAL HIGH (ref 0.1–1.0)
Monocytes Relative: 13 %
Neutro Abs: 7.3 10*3/uL (ref 1.7–7.7)
Neutrophils Relative %: 63 %
Platelets: 275 10*3/uL (ref 150–400)
RBC: 4.65 MIL/uL (ref 4.22–5.81)
RDW: 17.3 % — ABNORMAL HIGH (ref 11.5–15.5)
WBC: 11.6 10*3/uL — ABNORMAL HIGH (ref 4.0–10.5)
nRBC: 0 % (ref 0.0–0.2)

## 2023-01-10 LAB — COMPREHENSIVE METABOLIC PANEL
ALT: 23 U/L (ref 0–44)
AST: 35 U/L (ref 15–41)
Albumin: 3.3 g/dL — ABNORMAL LOW (ref 3.5–5.0)
Alkaline Phosphatase: 182 U/L — ABNORMAL HIGH (ref 38–126)
Anion gap: 17 — ABNORMAL HIGH (ref 5–15)
BUN: 33 mg/dL — ABNORMAL HIGH (ref 6–20)
CO2: 25 mmol/L (ref 22–32)
Calcium: 8.9 mg/dL (ref 8.9–10.3)
Chloride: 96 mmol/L — ABNORMAL LOW (ref 98–111)
Creatinine, Ser: 8.99 mg/dL — ABNORMAL HIGH (ref 0.61–1.24)
GFR, Estimated: 6 mL/min — ABNORMAL LOW (ref >60)
Glucose, Bld: 90 mg/dL (ref 70–99)
Potassium: 4.3 mmol/L (ref 3.5–5.1)
Sodium: 138 mmol/L (ref 135–145)
Total Bilirubin: 0.7 mg/dL (ref 0.3–1.2)
Total Protein: 8.2 g/dL — ABNORMAL HIGH (ref 6.5–8.1)

## 2023-01-10 LAB — LACTIC ACID, PLASMA
Lactic Acid, Venous: 2.2 mmol/L (ref 0.5–1.9)
Lactic Acid, Venous: 1.9 mmol/L (ref 0.5–1.9)

## 2023-01-10 LAB — RESP PANEL BY RT-PCR (RSV, FLU A&B, COVID)  RVPGX2
Influenza A by PCR: NEGATIVE
Influenza B by PCR: NEGATIVE
Resp Syncytial Virus by PCR: NEGATIVE
SARS Coronavirus 2 by RT PCR: NEGATIVE

## 2023-01-10 LAB — APTT: aPTT: 40 seconds — ABNORMAL HIGH (ref 24–36)

## 2023-01-10 LAB — PROTIME-INR
INR: 1.1 (ref 0.8–1.2)
Prothrombin Time: 14.3 seconds (ref 11.4–15.2)

## 2023-01-10 MED ORDER — SODIUM CHLORIDE 0.9 % IV SOLN
1.0000 g | Freq: Once | INTRAVENOUS | Status: AC
  Administered 2023-01-10: 1 g via INTRAVENOUS
  Filled 2023-01-10 (×2): qty 10

## 2023-01-10 MED ORDER — SODIUM CHLORIDE 0.9 % IV SOLN: 2.0000 g | Freq: Once | INTRAVENOUS | Status: DC

## 2023-01-10 MED ORDER — LINEZOLID 600 MG/300ML IV SOLN
600.0000 mg | Freq: Two times a day (BID) | INTRAVENOUS | Status: DC
  Administered 2023-01-11 – 2023-01-15 (×9): 600 mg via INTRAVENOUS
  Filled 2023-01-10 (×12): qty 300

## 2023-01-10 MED ORDER — BACITRACIN ZINC 500 UNIT/GM EX OINT
TOPICAL_OINTMENT | CUTANEOUS | Status: AC
  Filled 2023-01-10: qty 0.9

## 2023-01-10 MED ORDER — ACETAMINOPHEN 500 MG PO TABS
1000.0000 mg | ORAL_TABLET | Freq: Once | ORAL | Status: AC
  Administered 2023-01-10: 1000 mg via ORAL
  Filled 2023-01-10: qty 2

## 2023-01-10 MED ORDER — SODIUM CHLORIDE 0.9 % IV BOLUS
1000.0000 mL | Freq: Once | INTRAVENOUS | Status: AC
  Administered 2023-01-10: 1000 mL via INTRAVENOUS

## 2023-01-10 MED ORDER — METRONIDAZOLE 500 MG/100ML IV SOLN
500.0000 mg | Freq: Once | INTRAVENOUS | Status: AC
  Administered 2023-01-10: 500 mg via INTRAVENOUS
  Filled 2023-01-10: qty 100

## 2023-01-10 MED ORDER — LACTATED RINGERS IV SOLN: INTRAVENOUS | Status: AC

## 2023-01-10 NOTE — ED Notes (Signed)
Pt states he wants fluids stopped. R/t being a  dialysis pt.

## 2023-01-10 NOTE — ED Notes (Signed)
Dr Sabra Heck made aware of pt's BP of 78/44.

## 2023-01-10 NOTE — ED Provider Notes (Signed)
[facility] Provider Note   CSN: [id number] Arrival date & time: 01/10/23  1732     History  Chief Complaint  Patient presents with   Fever    [name] is a 57 y.o. male.   Fever  This patient is a 57 year old male who presents from home with a complaint of a fever, he is actually found to have a fever at dialysis today and was given antibiotics during dialysis.  The patient reports to me that he has had some pain in his leg behind his calf, small amount, no swelling or redness, he does have a history of atrial fibrillation, he does not take anticoagulation.  He reports to me that the fever was quite high, he does not know why he has it but he has a nonhealing wound on his left leg on the anterior ankle which has been dressed and kept good care of.  He does not have any pain in that area.  No coughing, no abdominal pain, no vomiting or diarrhea.  He has felt sweaty since the fever started to break    Home Medications Prior to Admission medications   Medication Sig Start Date End Date Taking? Authorizing Provider  acetaminophen (TYLENOL) 500 MG tablet Take 1,000 mg by mouth every 6 (six) hours as needed (pain.).    [provider]  allopurinol (ZYLOPRIM) 100 MG tablet Take 100 mg by mouth in the morning. 05/22/21   [provider]  amoxicillin (AMOXIL) 500 MG capsule Take 2,000 mg by mouth See admin instructions. Take 4 capsules (2000 mg) by mouth 1 hour prior to dental appointments. 11/02/22   [provider]  Ascorbic Acid (VITAMIN C PO) Take 500 mg by mouth in the morning.    [provider]  aspirin EC 81 MG tablet Take 81 mg by mouth in the morning.    [provider]  atorvastatin (LIPITOR) 80 MG tablet Take 80 mg by mouth at bedtime.    [provider]  calcium carbonate (TUMS EX) 750 MG chewable tablet Chew 2 tablets by mouth at bedtime.    [provider]   Cholecalciferol (VITAMIN D) 50 MCG (2000 UT) tablet Take 2,000 Units by mouth in the morning.    [provider]  cyclobenzaprine (FLEXERIL) 10 MG tablet Take 10 mg by mouth 2 (two) times daily as needed for muscle spasms. 06/11/21   [provider]  famotidine (PEPCID) 20 MG tablet Take 20 mg by mouth at bedtime.    [provider]  metoprolol tartrate (LOPRESSOR) 25 MG tablet Take 12.5 mg by mouth 2 (two) times daily.    [provider]  midodrine (PROAMATINE) 5 MG tablet Take 3 tablets (15 mg total) by mouth 3 (three) times daily with meals. Patient taking differently: Take 15-20 mg by mouth See admin instructions. Take 4 tablets (20 mg) by mouth in the morning before dialysis on Mondays, Wednesday & Fridays and Take 3 tablets (15 mg) by mouth during dialysis sessions on Mondays, Wednesdays & Fridays. 08/30/22   Barton Dubois, MD  Multiple Vitamin (MULTIVITAMIN WITH MINERALS) TABS tablet Take 1 tablet by mouth in the morning.    [provider]  pantoprazole (PROTONIX) 40 MG tablet Take 1 tablet (40 mg total) by mouth 2 (two) times daily. Patient taking differently: Take 40 mg by mouth daily before breakfast. 04/07/21   Elgergawy, Silver Huguenin, MD  predniSONE (DELTASONE) 10 MG tablet Take 10 mg by  mouth at bedtime.    [provider]  tamsulosin (FLOMAX) 0.4 MG CAPS capsule Take 0.4 mg by mouth every evening.    [provider]      Allergies    Vancomycin    Review of Systems   Review of Systems  Constitutional:  Positive for fever.  All other systems reviewed and are negative.   Physical Exam Updated Vital Signs BP 111/84 (BP Location: Right Arm)   Pulse 89   Temp (!) 102 F (38.9 C)   Resp 20   SpO2 98%  Physical Exam Vitals and nursing note reviewed.  Constitutional:      General: He is not in acute distress.    Appearance: He is well-developed.  HENT:     Head: Normocephalic and atraumatic.     Mouth/Throat:      Pharynx: No oropharyngeal exudate.  Eyes:     General: No scleral icterus.       Right eye: No discharge.        Left eye: No discharge.     Conjunctiva/sclera: Conjunctivae normal.     Pupils: Pupils are equal, round, and reactive to light.  Neck:     Thyroid: No thyromegaly.     Vascular: No JVD.  Cardiovascular:     Rate and Rhythm: Tachycardia present. Rhythm irregular.     Heart sounds: Normal heart sounds. No murmur heard.    No friction rub. No gallop.  Pulmonary:     Effort: Pulmonary effort is normal. No respiratory distress.     Breath sounds: Normal breath sounds. No wheezing or rales.  Abdominal:     General: Bowel sounds are normal. There is no distension.     Palpations: Abdomen is soft. There is no mass.     Tenderness: There is no abdominal tenderness.  Musculoskeletal:        General: No tenderness. Normal range of motion.     Cervical back: Normal range of motion and neck supple.     Right lower leg: No edema.     Left lower leg: No edema.  Lymphadenopathy:     Cervical: No cervical adenopathy.  Skin:    General: Skin is warm and dry.     Findings: No erythema or rash.     Comments: Approximately 5 to 6 cm in diameter open wound with good healing tissue on the left anterior ankle.  No tenderness or surrounding erythema, no foul smell  Neurological:     General: No focal deficit present.     Mental Status: He is alert. Mental status is at baseline.     Coordination: Coordination normal.  Psychiatric:        Behavior: Behavior normal.     ED Results / Procedures / Treatments   Labs (all labs ordered are listed, but only abnormal results are displayed) Labs Reviewed  CBC WITH DIFFERENTIAL/PLATELET - Abnormal; Notable for the following components:      Result Value   WBC 11.6 (*)    MCHC 29.6 (*)    RDW 17.3 (*)    Monocytes Absolute 1.5 (*)    Eosinophils Absolute 0.8 (*)    All other components within normal limits  CULTURE, BLOOD (ROUTINE X 2)   CULTURE, BLOOD (ROUTINE X 2) W REFLEX TO ID PANEL  COMPREHENSIVE METABOLIC PANEL  LACTIC ACID, PLASMA  LACTIC ACID, PLASMA  PROTIME-INR    EKG None  Radiology DG Chest 2 View  Result Date: 01/10/2023 CLINICAL DATA:  Sepsis, calf pain EXAM: CHEST - 2 VIEW COMPARISON:  09/04/2022 FINDINGS: Chronic pleural thickening and confluent linear densities at the right lung base extending up into the right mid lung, not substantially changed from 09/04/2022, presumably primarily from scarring. Plate and screw fixator of the right humerus. Speckled metal densities/metal fragments along the right anterior chest, as before. Low lung volumes are present, causing crowding of the pulmonary vasculature. Upper normal heart size. The left lung appears clear. Exaggerated upward convex lobulation of both hemidiaphragms. IMPRESSION: 1. Chronic scarring at the right lung base extending up into the right mid lung, not substantially changed from 09/04/2022. 2. Low lung volumes. 3. Upper normal heart size. 4. Exaggerated upward convex lobulation of both hemidiaphragms. Electronically Signed   By: Van Clines M.D.   On: 01/10/2023 19:02   DG Tibia/Fibula Left  Result Date: 01/10/2023 CLINICAL DATA:  Calf pain, suspected sepsis. EXAM: LEFT TIBIA AND FIBULA - 2 VIEW COMPARISON:  None Available. FINDINGS: There is no evidence of fracture or other focal bone lesions. Extensive peripheral vascular calcifications are present. The soft tissues are otherwise within normal limits. IMPRESSION: Negative. Electronically Signed   By: Ronney Asters M.D.   On: 01/10/2023 19:00    Procedures .Critical Care  Performed by: Noemi Chapel, MD Authorized by: Noemi Chapel, MD   Critical care provider statement:    Critical care time (minutes):  45   Critical care time was exclusive of:  Separately billable procedures and treating other patients and teaching time   Critical care was necessary to treat or prevent imminent or  life-threatening deterioration of the following conditions:  Sepsis   Critical care was time spent personally by me on the following activities:  Development of treatment plan with patient or surrogate, discussions with consultants, evaluation of patient's response to treatment, examination of patient, obtaining history from patient or surrogate, review of old charts, re-evaluation of patient's condition, pulse oximetry, ordering and review of radiographic studies, ordering and review of laboratory studies and ordering and performing treatments and interventions   I assumed direction of critical care for this patient from another provider in my specialty: no     Care discussed with: admitting provider   Comments:           Medications Ordered in ED Medications  lactated ringers infusion (has no administration in time range)  ceFEPIme (MAXIPIME) 2 g in sodium chloride 0.9 % 100 mL IVPB (has no administration in time range)  metroNIDAZOLE (FLAGYL) IVPB 500 mg (has no administration in time range)  acetaminophen (TYLENOL) tablet 1,000 mg (1,000 mg Oral Given 01/10/23 1811)    ED Course/ Medical Decision Making/ A&P                             Medical Decision Making Amount and/or Complexity of Data Reviewed Labs: ordered. Radiology: ordered. ECG/medicine tests: ordered.  Risk OTC drugs. Prescription drug management. Decision regarding hospitalization.   This patient presents to the ED for concern of sepsis, this involves an extensive number of treatment options, and is a complaint that carries with it a high risk of complications and morbidity.  The differential diagnosis includes bacteremia from dialysis, could be COVID or flu, could be other infection including his ankle but it does appear well   Co morbidities that complicate the patient evaluation  End-stage renal he is on dialysis   Additional history obtained:  Additional history obtained from external medical  record External records from outside source obtained and reviewed including dialysis patient, no recent admissions, cared for by plastic surgery for open wound, admitted about a month ago for debridement of his wound, also admitted for DVT back in October   Lab Tests:  I Ordered, and personally interpreted labs.  The pertinent results include: Mild leukocytosis, COVID and flu negative, metabolic panel reassuring with regards to potassium, creatinine is as expected elevated.  Liver function test okay.  Lactic acid of 2.2, white blood cell count of 11.6, INR of 1.1, blood cultures pending   Imaging Studies ordered:  I ordered imaging studies including chest x-ray and tib-fib x-ray, neither show any significant findings I independently visualized and interpreted imaging which showed no acute findings I agree with the radiologist interpretation   Cardiac Monitoring: / EKG:  The patient was maintained on a cardiac monitor.  I personally viewed and interpreted the cardiac monitored which showed an underlying rhythm of: Tachycardic in atrial fibrillation   Consultations Obtained:  I requested consultation with the hospitalist,  and discussed lab and imaging findings as well as pertinent plan - they recommend: Admit   Problem List / ED Course / Critical interventions / Medication management  Patient likely bacteremic or sepsis, requiring multiple fluid boluses for hypotension I have reviewed the patients home medicines and have made adjustments as needed   Social Determinants of Health:  Possible sepsis, end-stage renal disease   Test / Admission - Considered:  Will admit to the hospital         Final Clinical Impression(s) / ED Diagnoses Final diagnoses:  Bacteremia    Rx / DC Orders ED Discharge Orders     None         Noemi Chapel, MD 01/10/23 2138

## 2023-01-10 NOTE — Assessment & Plan Note (Addendum)
-   Heart rate continues to fluctuate between A-fib, SVTs Heart rate up to 150s -Started on amiodarone drip -in the setting of soft blood pressure, heart rate remains uncontrolled despite several boluses of IV amiodarone; he likely will need a cardioversion.  Will make NPO after midnight in the case that cardiology team might be able to cardiovert him on 3/4.   -Cardiology consulted-appreciate close follow-up on recommendations  - Blood pressure is too low for metoprolol or Cardizem  - Patient is not on anticoagulation but does have a Watchman - status post watchman (08/2021).    - prior to admission he had taken metoprolol on Tuesday, Thursday, Saturday, Sundays - we have held due to very soft blood pressures   - He requires high doses of midodrine to maintain BP which is also making the tachycardia much more difficult to control

## 2023-01-10 NOTE — Assessment & Plan Note (Addendum)
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

## 2023-01-10 NOTE — H&P (Signed)
History and Physical    Patient: [name] [id number] DOB: [birth year] DOA: 01/10/2023 DOS: the patient was seen and examined on 01/10/2023 PCP: [name] Rhyme, MD  Patient coming from: Home  Chief Complaint:  Chief Complaint  Patient presents with   Fever   HPI: [name] is a 57 y.o. male with medical history significant of atrial fibrillation, CHF, ESRD on dialysis Monday Wednesday Friday, DVT of left lower extremity, GERD, hyperlipidemia, hypertension, and more presents the ED with a chief complaint of fever.  Patient reports that he had a fever that started 3 days ago.  It seemed to be resolving 2 days ago, and he felt good yesterday.  Fever returned again today.  He reports a Tmax at home of 102.4.  Patient arrived with a temperature of 102.  Patient reports that his only symptom out of the norm for him is been left lower extremity soreness.  He was worried he had a DVT there because it feels the same as when he did have a DVT in October.  Patient reports that he finished 90 days of Eliquis back in October, but has not continued to be on anticoagulation.  For A-fib, patient has Watchman device.  Patient describes this pain is a soreness in the back of his calf.  He does have a ulcer lower on that extremity.  He has been treating that per wound care and is due for his last skin graft coming up.  Patient reports that it has not been draining anything.  He reports the wound is as clean as can be.  Patient denies any cough, nausea, vomiting, diarrhea.  Patient does not make urine.  He reports that his stomach has been "gurgly" lately, and that his wife thought he had a GI virus.  He reports that everybody he knew with a GI virus was having nausea and vomiting, and he was not having any of the symptoms.  He has had a decreased appetite.  His last normal meal was about 4 days ago.  He is only snack since then.  Patient did go to dialysis today.  He has no other complaints at this  time.  Patient reports every time he comes into the hospital he has A-fib with RVR.  He is only started on amiodarone and discharged with the same.  Every time his cardiologist advises him not to take amiodarone.  Patient does not smoke, does not drink, does not use illicit drugs.  He is vaccinated for COVID and pneumonia.  Patient is full code. Review of Systems: As mentioned in the history of present illness. All other systems reviewed and are negative. 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    ESOPHAGOGASTRODUODENOSCOPY (EGD) WITH PROPOFOL N/A 03/31/2021   active bleeding in duodenum due to suspected AVM s/p hemostatic spray   GIVENS CAPSULE STUDY N/A 06/04/2021   Procedure: GIVENS CAPSULE STUDY;  Surgeon: Eloise Harman, DO;  [facility];  Service: Endoscopy;  Laterality: N/A;  7:30am   HEMOSTASIS CONTROL  03/31/2021   Procedure: HEMOSTASIS CONTROL;  Surgeon: Thornton Park, MD;  Location: [place];  Service: Gastroenterology;;   HERNIA MESH REMOVAL     abdominal    INCISION AND DRAINAGE OF WOUND Left 10/21/2022   Procedure: IRRIGATION AND DEBRIDEMENT WOUND left leg wound with placement of myriad;  Surgeon: [name], MD;  Location: [place];  Service: Plastics;  Laterality: Left;   INCISION AND DRAINAGE OF WOUND Left 11/30/2022   Procedure: debridement and preparation of wound, left leg;  Surgeon: [name], MD;  Location: [place];  Service: Plastics;  Laterality: Left;   LEFT ATRIAL APPENDAGE OCCLUSION  09/10/2021   SMALL BOWEL ENTEROSCOPY     Normal stomach, normal duodenum, 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   TRANSPLANTATION RENAL  2005   Social History:  reports that he has never smoked. He has never used smokeless tobacco. He reports that he does not drink alcohol and does not use drugs.  Allergies  Allergen Reactions   Vancomycin Swelling    Family History  Problem Relation Age of Onset   Hypertension Mother    Heart attack Father    Hypertension Maternal Grandmother    Stroke Maternal Grandfather    Heart attack Paternal Uncle    Heart attack Paternal Uncle    Heart attack Paternal Aunt    Stroke Maternal Uncle     Prior to Admission medications   Medication Sig Start Date End Date Taking? Authorizing Provider  acetaminophen (TYLENOL) 500 MG tablet Take 1,000 mg by mouth every 6 (six) hours as needed (pain.).    [provider]  allopurinol (ZYLOPRIM) 100 MG tablet Take 100 mg by mouth in the morning. 05/22/21   [provider]  amoxicillin (AMOXIL) 500 MG capsule Take 2,000 mg by mouth See admin instructions. Take 4 capsules (2000 mg) by mouth 1 hour prior to dental appointments. 11/02/22   [provider]  Ascorbic Acid (VITAMIN C PO) Take 500 mg by mouth in the morning.    [provider]  aspirin EC 81 MG tablet Take 81 mg by mouth in the morning.    [provider]  atorvastatin (LIPITOR) 80 MG tablet Take 80 mg by mouth at bedtime.    [provider]  calcium carbonate  (TUMS EX) 750 MG chewable tablet Chew 2 tablets by mouth at bedtime.    [provider]  Cholecalciferol (VITAMIN D) 50 MCG (2000 UT) tablet Take 2,000 Units by mouth in the morning.    [provider]  cyclobenzaprine (FLEXERIL) 10 MG tablet Take 10 mg by mouth 2 (two) times daily as needed for muscle spasms. 06/11/21   [provider]  famotidine (PEPCID) 20 MG tablet Take 20 mg by mouth at bedtime.    [provider]  metoprolol tartrate (LOPRESSOR) 25 MG tablet Take 12.5 mg by mouth 2 (two) times daily.    [provider]  midodrine (  PROAMATINE) 5 MG tablet Take 3 tablets (15 mg total) by mouth 3 (three) times daily with meals. Patient taking differently: Take 15-20 mg by mouth See admin instructions. Take 4 tablets (20 mg) by mouth in the morning before dialysis on Mondays, Wednesday & Fridays and Take 3 tablets (15 mg) by mouth during dialysis sessions on Mondays, Wednesdays & Fridays. 08/30/22   Barton Dubois, MD  Multiple Vitamin (MULTIVITAMIN WITH MINERALS) TABS tablet Take 1 tablet by mouth in the morning.    [provider]  pantoprazole (PROTONIX) 40 MG tablet Take 1 tablet (40 mg total) by mouth 2 (two) times daily. Patient taking differently: Take 40 mg by mouth daily before breakfast. 04/07/21   Elgergawy, Silver Huguenin, MD  predniSONE (DELTASONE) 10 MG tablet Take 10 mg by mouth at bedtime.    [provider]  tamsulosin (FLOMAX) 0.4 MG CAPS capsule Take 0.4 mg by mouth every evening.    [provider]    Physical Exam: Vitals:   01/10/23 2100 01/10/23 2115 01/10/23 2130 01/10/23 2145  BP: (!) 94/57 (!) 91/51 100/69 101/64  Pulse:      Resp:    17  Temp:      TempSrc:      SpO2:    98%   1.  General: Patient lying supine in bed,  no acute distress   2. Psychiatric: Alert and oriented x 3, mood and behavior normal for situation, pleasant and cooperative with exam   3. Neurologic: Speech and language are  normal, face is symmetric, moves all 4 extremities voluntarily, at baseline without acute deficits on limited exam   4. HEENMT:  Head is atraumatic, normocephalic, pupils reactive to light, neck is supple, trachea is midline, mucous membranes are moist   5. Respiratory : Lungs are clear to auscultation bilaterally without wheezing, rhonchi, rales, no cyanosis, no increase in work of breathing or accessory muscle use   6. Cardiovascular : Heart rate tachycardic, rhythm is irregularly irregular, no murmurs, rubs or gallops, no peripheral edema, peripheral pulses palpated   7. Gastrointestinal:  Abdomen is soft, nondistended, nontender to palpation bowel sounds active, no masses or organomegaly palpated   8. Skin:  Skin is warm, dry and intact without rashes, acute lesions, or ulcers on limited exam   9.Musculoskeletal:  No acute deformities or trauma, no asymmetry in tone, no peripheral edema, peripheral pulses palpated, tenderness to palpation of the left calf  Data Reviewed: In the ED Temp 98.5-102, heart rate 89-128, blood pressure 78/44-111/84, satting 95-100% No leukocytosis with a white blood cell count of 11.6, hemoglobin 13.4 Chemistry reveals an elevated creatinine at 8.99 in this ESRD patient Alk phos elevated 182, albumin low at 3.3, lactic acid 2.2 Negative COVID, flu, RSV Blood cultures pending EKG shows A-fib with RVR with a heart rate of 133 Patient was given Tylenol, cefepime, vancomycin, Flagyl, 2 normal saline boluses and LR to 150 mill per hour Chest x-ray shows no infectious findings Admission requested for sepsis of unknown source  Assessment and Plan: * Sepsis ([place]) - Temp 102, heart rate up to 150, blood pressure 78/44, lactic acid 2.2 - Negative COVID, flu, RSV - Blood cultures pending - Patient started on cefepime, vancomycin, Flagyl - 2 L boluses given - LR was continued at 150 MLS per hour, with close monitoring for fluid overload given patient does  not make urine - Continue vancomycin and cefepime - Chest x-ray shows no infectious findings - Patient has a well-healing wound on  the left lower extremity that could be potentially a source - Contamination during dialysis is also a possible source - Procalcitonin pending - Tylenol for fever - Continue to monitor  ESRD on dialysis [facility]) - Dialysis Monday Wednesday Friday - Last dialysis today - Consult nephro for routine dialysis - Continue daily calcium carbonate - Defer electrolyte management to nephro - Midodrine on dialysis days - Continue to monitor  GERD (gastroesophageal reflux disease) - Continue Pepcid  Mixed hyperlipidemia - Continue statin  History of DVT (deep vein thrombosis) - History of DVT in the left lower extremity - Finish 90 days of Eliquis - Similar symptoms now - Ultrasound DVT left lower extremity in the a.m.  Hypotension - Continue midodrine on dialysis days - Blood pressure at arrival 78/44 - Blood pressure during admission exam low 100s - Continue to monitor  Atrial fibrillation with rapid ventricular response (HCC) - Heart rate up to 150 during my exam - With rest with recovery to low 100s - Blood pressure is too low for metoprolol or Cardizem - Admitting to stepdown in case patient should sustain above 120, at which time amiodarone would be considered - Patient is not on anticoagulation but does have a Watchman - Patient does take metoprolol on Tuesday, Thursday, Saturday, Sunday - Continue to monitor      Advance Care Planning:   Code Status: Full Code   Consults: Nephrology  Family Communication: No family at bedside  Severity of Illness: The appropriate patient status for this patient is INPATIENT. Inpatient status is judged to be reasonable and necessary in order to provide the required intensity of service to ensure the patient's safety. The patient's presenting symptoms, physical exam findings, and initial radiographic and  laboratory data in the context of their chronic comorbidities is felt to place them at high risk for further clinical deterioration. Furthermore, it is not anticipated that the patient will be medically stable for discharge from the hospital within 2 midnights of admission.   * I certify that at the point of admission it is my clinical judgment that the patient will require inpatient hospital care spanning beyond 2 midnights from the point of admission due to high intensity of service, high risk for further deterioration and high frequency of surveillance required.*  Author: Rolla Plate, DO 01/10/2023 10:03 PM  For on call review [email or web address].

## 2023-01-10 NOTE — Assessment & Plan Note (Addendum)
-   Dialysis Monday Wednesday Friday - s/p hemodialysis 01/12/2023-tolerated well - Consulted nephro for routine dialysis, last HD was 3/2  - Continue daily calcium carbonate - monitor electrolytes  - Midodrine 20 mg on dialysis days - Continue to monitor

## 2023-01-10 NOTE — Consult Note (Signed)
CODE SEPSIS - PHARMACY COMMUNICATION  **Broad Spectrum Antibiotics should be administered within 1 hour of Sepsis diagnosis**  Time Code Sepsis Called/Page Received: 1955  Antibiotics Ordered: flagyl, cefepime  Time of 1st antibiotic administration: 2016  Additional action taken by pharmacy: none  If necessary, Name of Provider/Nurse Contacted: n/a    Pearla Dubonnet ,PharmD Clinical Pharmacist  01/10/2023  8:27 PM

## 2023-01-10 NOTE — ED Triage Notes (Signed)
Pt with wound on left foot is here for fever.  Pt states that he is changing his dressing daily and the wound looks good but his has pain in his posterior calf and that he has been having fevers since yesterday.  Pt had HD today and reports that he was given antibiotics today during treatment due to fever and also had blood cultures done.  Temp 102F in triage.

## 2023-01-10 NOTE — ED Notes (Signed)
Hospitalist in room.

## 2023-01-10 NOTE — Assessment & Plan Note (Addendum)
-   possibly secondary to pneumonia noted on CXR in the right lung base - CXR today showing signs of improvement   - POA: Temp 102, HR up to 150, Bp at low as 78/44, lactic acid 2.2 - Negative COVID, flu, RSV - Blood cultures >>>> NGTD - Patient initially started on cefepime, vancomycin, Flagyl; MRSA screen negative;  - wife insists he be put on ceftriaxone, now off Zyvox/vanc/cefepime - clinically he is improving - check TTE as part of infectious work up  - appreciate ID consult and recommendations

## 2023-01-10 NOTE — Consult Note (Addendum)
PHARMACY -  BRIEF ANTIBIOTIC NOTE   Pharmacy has received consult(s) for Cefepime from an ED provider.  The patient's profile has been reviewed for ht/wt/allergies/indication/available labs.    One time order(s) placed for Cefepime 1g IV x 1 dose(ESRD patient)  Further antibiotics/pharmacy consults should be ordered by admitting physician if indicated.                       Thank you, Pearla Dubonnet 01/10/2023  8:50 PM

## 2023-01-10 NOTE — Telephone Encounter (Signed)
Faxed signed wound supply list to [facility] with confirmed receipt.

## 2023-01-10 NOTE — ED Notes (Signed)
Called [facility] for maxipime

## 2023-01-10 NOTE — Assessment & Plan Note (Addendum)
-   Continue high dose midodrine on dialysis days (20 mg); 15 mg on non HD days - He has been weaned off norepinephrine and will try not to restart this unless absolutely needed  - Continue to monitor

## 2023-01-10 NOTE — Assessment & Plan Note (Signed)
Continue Pepcid  

## 2023-01-10 NOTE — ED Notes (Signed)
Per pt. He would like staff to know that his heart dr does not like for him to be on amio because everytime he gets started on it his heart dr takes him off of it. Would like to wait for infection to go down before heartrate is treated.

## 2023-01-10 NOTE — Assessment & Plan Note (Signed)
Continue statin. 

## 2023-01-11 ENCOUNTER — Inpatient Hospital Stay (HOSPITAL_COMMUNITY): Payer: Medicare Other

## 2023-01-11 ENCOUNTER — Encounter (HOSPITAL_COMMUNITY): Payer: Self-pay | Admitting: Family Medicine

## 2023-01-11 ENCOUNTER — Other Ambulatory Visit: Payer: Self-pay

## 2023-01-11 DIAGNOSIS — N186 End stage renal disease: Secondary | ICD-10-CM | POA: Diagnosis not present

## 2023-01-11 DIAGNOSIS — I4891 Unspecified atrial fibrillation: Secondary | ICD-10-CM | POA: Diagnosis not present

## 2023-01-11 DIAGNOSIS — Z992 Dependence on renal dialysis: Secondary | ICD-10-CM | POA: Diagnosis not present

## 2023-01-11 DIAGNOSIS — I950 Idiopathic hypotension: Secondary | ICD-10-CM | POA: Diagnosis not present

## 2023-01-11 LAB — COMPREHENSIVE METABOLIC PANEL
ALT: 16 U/L (ref 0–44)
AST: 24 U/L (ref 15–41)
Albumin: 2.8 g/dL — ABNORMAL LOW (ref 3.5–5.0)
Alkaline Phosphatase: 151 U/L — ABNORMAL HIGH (ref 38–126)
Anion gap: 14 (ref 5–15)
BUN: 37 mg/dL — ABNORMAL HIGH (ref 6–20)
CO2: 23 mmol/L (ref 22–32)
Calcium: 8.2 mg/dL — ABNORMAL LOW (ref 8.9–10.3)
Chloride: 100 mmol/L (ref 98–111)
Creatinine, Ser: 10.06 mg/dL — ABNORMAL HIGH (ref 0.61–1.24)
GFR, Estimated: 6 mL/min — ABNORMAL LOW (ref >60)
Glucose, Bld: 83 mg/dL (ref 70–99)
Potassium: 4.2 mmol/L (ref 3.5–5.1)
Sodium: 137 mmol/L (ref 135–145)
Total Bilirubin: 0.8 mg/dL (ref 0.3–1.2)
Total Protein: 7 g/dL (ref 6.5–8.1)

## 2023-01-11 LAB — CBC WITH DIFFERENTIAL/PLATELET
Abs Immature Granulocytes: 0.05 10*3/uL (ref 0.00–0.07)
Basophils Absolute: 0.1 10*3/uL (ref 0.0–0.1)
Basophils Relative: 1 %
Eosinophils Absolute: 1.2 10*3/uL — ABNORMAL HIGH (ref 0.0–0.5)
Eosinophils Relative: 10 %
HCT: 44.2 % (ref 39.0–52.0)
Hemoglobin: 12.8 g/dL — ABNORMAL LOW (ref 13.0–17.0)
Immature Granulocytes: 0 %
Lymphocytes Relative: 11 %
Lymphs Abs: 1.3 10*3/uL (ref 0.7–4.0)
MCH: 29.2 pg (ref 26.0–34.0)
MCHC: 29 g/dL — ABNORMAL LOW (ref 30.0–36.0)
MCV: 100.9 fL — ABNORMAL HIGH (ref 80.0–100.0)
Monocytes Absolute: 1.5 10*3/uL — ABNORMAL HIGH (ref 0.1–1.0)
Monocytes Relative: 14 %
Neutro Abs: 7.2 10*3/uL (ref 1.7–7.7)
Neutrophils Relative %: 64 %
Platelets: 235 10*3/uL (ref 150–400)
RBC: 4.38 MIL/uL (ref 4.22–5.81)
RDW: 17 % — ABNORMAL HIGH (ref 11.5–15.5)
WBC: 11.3 10*3/uL — ABNORMAL HIGH (ref 4.0–10.5)
nRBC: 0 % (ref 0.0–0.2)

## 2023-01-11 LAB — LACTIC ACID, PLASMA: Lactic Acid, Venous: 1.2 mmol/L (ref 0.5–1.9)

## 2023-01-11 LAB — HEPATITIS B SURFACE ANTIGEN: Hepatitis B Surface Ag: NONREACTIVE

## 2023-01-11 LAB — GLUCOSE, CAPILLARY: Glucose-Capillary: 96 mg/dL (ref 70–99)

## 2023-01-11 LAB — MAGNESIUM: Magnesium: 2.1 mg/dL (ref 1.7–2.4)

## 2023-01-11 LAB — PROCALCITONIN: Procalcitonin: 6.62 ng/mL

## 2023-01-11 MED ORDER — MIDODRINE HCL 5 MG PO TABS: 20.0000 mg | ORAL_TABLET | ORAL | Status: DC

## 2023-01-11 MED ORDER — CALCIUM CARBONATE ANTACID 500 MG PO CHEW
1500.0000 mg | CHEWABLE_TABLET | Freq: Every day | ORAL | Status: DC
  Administered 2023-01-11 – 2023-01-24 (×14): 1500 mg via ORAL
  Filled 2023-01-11 (×14): qty 8

## 2023-01-11 MED ORDER — HEPARIN SODIUM (PORCINE) 5000 UNIT/ML IJ SOLN
5000.0000 [IU] | Freq: Three times a day (TID) | INTRAMUSCULAR | Status: DC
  Administered 2023-01-11 – 2023-01-25 (×42): 5000 [IU] via SUBCUTANEOUS
  Filled 2023-01-11 (×42): qty 1

## 2023-01-11 MED ORDER — DILTIAZEM HCL 30 MG PO TABS
30.0000 mg | ORAL_TABLET | Freq: Three times a day (TID) | ORAL | Status: DC
  Administered 2023-01-12 – 2023-01-13 (×3): 30 mg via ORAL
  Filled 2023-01-11 (×4): qty 1

## 2023-01-11 MED ORDER — TAMSULOSIN HCL 0.4 MG PO CAPS
0.4000 mg | ORAL_CAPSULE | Freq: Every evening | ORAL | Status: DC
  Administered 2023-01-11 – 2023-01-24 (×14): 0.4 mg via ORAL
  Filled 2023-01-11 (×14): qty 1

## 2023-01-11 MED ORDER — FAMOTIDINE 20 MG PO TABS
20.0000 mg | ORAL_TABLET | Freq: Every day | ORAL | Status: DC
  Administered 2023-01-11 – 2023-01-24 (×14): 20 mg via ORAL
  Filled 2023-01-11 (×14): qty 1

## 2023-01-11 MED ORDER — DILTIAZEM HCL 30 MG PO TABS
30.0000 mg | ORAL_TABLET | Freq: Once | ORAL | Status: AC
  Administered 2023-01-11: 30 mg via ORAL
  Filled 2023-01-11: qty 1

## 2023-01-11 MED ORDER — METOPROLOL TARTRATE 25 MG PO TABS: 12.5000 mg | ORAL_TABLET | Freq: Two times a day (BID) | ORAL | Status: DC

## 2023-01-11 MED ORDER — CHLORHEXIDINE GLUCONATE CLOTH 2 % EX PADS
6.0000 | MEDICATED_PAD | Freq: Every day | CUTANEOUS | Status: DC
  Administered 2023-01-12 – 2023-01-21 (×9): 6 via TOPICAL

## 2023-01-11 MED ORDER — ASPIRIN 81 MG PO TBEC
81.0000 mg | DELAYED_RELEASE_TABLET | Freq: Every morning | ORAL | Status: DC
  Administered 2023-01-11 – 2023-01-25 (×15): 81 mg via ORAL
  Filled 2023-01-11 (×16): qty 1

## 2023-01-11 MED ORDER — MIDODRINE HCL 5 MG PO TABS
15.0000 mg | ORAL_TABLET | Freq: Three times a day (TID) | ORAL | Status: DC
  Administered 2023-01-11 – 2023-01-15 (×14): 15 mg via ORAL
  Filled 2023-01-11 (×14): qty 3

## 2023-01-11 MED ORDER — MIDODRINE HCL 5 MG PO TABS: 15.0000 mg | ORAL_TABLET | ORAL | Status: DC

## 2023-01-11 MED ORDER — ACETAMINOPHEN 325 MG PO TABS
650.0000 mg | ORAL_TABLET | Freq: Four times a day (QID) | ORAL | Status: DC | PRN
  Administered 2023-01-11 – 2023-01-25 (×34): 650 mg via ORAL
  Filled 2023-01-11 (×36): qty 2

## 2023-01-11 MED ORDER — SODIUM CHLORIDE 0.9 % IV SOLN
1.0000 g | INTRAVENOUS | Status: DC
  Administered 2023-01-11 – 2023-01-15 (×5): 1 g via INTRAVENOUS
  Filled 2023-01-11 (×7): qty 10

## 2023-01-11 MED ORDER — ATORVASTATIN CALCIUM 40 MG PO TABS
80.0000 mg | ORAL_TABLET | Freq: Every day | ORAL | Status: DC
  Administered 2023-01-11 – 2023-01-24 (×14): 80 mg via ORAL
  Filled 2023-01-11 (×14): qty 2

## 2023-01-11 MED ORDER — ONDANSETRON HCL 4 MG/2ML IJ SOLN
4.0000 mg | Freq: Four times a day (QID) | INTRAMUSCULAR | Status: DC | PRN
  Administered 2023-01-14: 4 mg via INTRAVENOUS
  Filled 2023-01-11: qty 2

## 2023-01-11 MED ORDER — ONDANSETRON HCL 4 MG PO TABS: 4.0000 mg | ORAL_TABLET | Freq: Four times a day (QID) | ORAL | Status: DC | PRN

## 2023-01-11 MED ORDER — ACETAMINOPHEN 650 MG RE SUPP: 650.0000 mg | Freq: Four times a day (QID) | RECTAL | Status: DC | PRN

## 2023-01-11 MED ORDER — OXYCODONE HCL 5 MG PO TABS
5.0000 mg | ORAL_TABLET | ORAL | Status: DC | PRN
  Administered 2023-01-12 – 2023-01-24 (×9): 5 mg via ORAL
  Filled 2023-01-11 (×11): qty 1

## 2023-01-11 NOTE — Progress Notes (Signed)
PROGRESS NOTE     [name], is a 57 y.o. male, DOB - [birth year], [id number]  Admit date - 01/10/2023   Admitting Physician Rolla Plate, DO  Outpatient Primary MD for the patient is Gardiner Rhyme, MD  LOS - 1  Chief Complaint  Patient presents with   Fever        Brief Narrative:  57 y.o. male with medical history significant of atrial fibrillation, CHF, ESRD on dialysis Monday Wednesday Friday, DVT of left lower extremity, GERD, hyperlipidemia, hypertension admitted on 01/10/2023 for with A-fib with RVR and soft BP and concerns about sepsis    -Assessment and Plan: 1)SIRS--POA Met SIRS criteria on admission -- Negative COVID, flu, RSV - Blood cultures pending -Procalcitonin 6.6, WBC 11.6, lactic acid was 2.2 -Patient initially received cefepime Flagyl and vancomycin --Continue cefepime, Zyvox - Patient received IV fluid boluses--patient does not make urine in the setting of ESRD so he is  high risk for volume overload - Chest x-ray shows no infectious findings - Patient has a well-healing wound on the left lower extremity that does not look infected -Midodrine orally -the patient was on midodrine on hemodialysis days, changed to 3 times daily every day now given soft BP -  consider Levophed if needed for pressure support -Check a.m. cortisol levels   2)ESRD on dialysis [facility]) - Dialysis Monday Wednesday Friday - Did Not miss any hemodialysis - -Plans for hemodialysis on 01/12/2023 if BP allows  3)Atrial fibrillation with rapid ventricular response (HCC) -oral Cardizem as BP allows -- Patient has Watchman device - Patient does take metoprolol on non-HD days   GERD (gastroesophageal reflux disease) - Continue Pepcid  Mixed hyperlipidemia - Continue statin  History of DVT (deep vein thrombosis) - History of DVT in the left lower extremity - Finish 90 days of Eliquis - Complains of left leg swelling, repeat venous Dopplers on 01/11/2023 without  acute DVT  Hypotension -Midodrine orally -the patient was on midodrine on hemodialysis days, changed to 3 times daily every day now given soft BP -  consider Levophed if needed for pressure support -Check a.m. cortisol levels  Status is: Inpatient   Disposition: The patient is from: Home              Anticipated d/c is to: Home              Anticipated d/c date is: 1 day              Patient currently is not medically stable to d/c. Barriers: Not Clinically Stable-   Code Status :  -  Code Status: Full Code   Family Communication:    NA (patient is alert, awake and coherent)   DVT Prophylaxis  :   - SCDs *  heparin injection 5,000 Units Start: 01/11/23 0600 SCDs Start: 01/11/23 0317   Lab Results  Component Value Date   PLT 235 01/11/2023   Inpatient Medications  Scheduled Meds:  aspirin EC  81 mg Oral q AM   atorvastatin  80 mg Oral QHS   calcium carbonate  1,500 mg Oral QHS   [START ON 01/12/2023] Chlorhexidine Gluconate Cloth  6 each Topical Q0600   famotidine  20 mg Oral QHS   heparin  5,000 Units Subcutaneous Q8H   midodrine  15 mg Oral TID WC   tamsulosin  0.4 mg Oral QPM   Continuous Infusions:  ceFEPime (MAXIPIME) IV     linezolid (ZYVOX) IV 300 mL/hr at 01/11/23  1633   PRN Meds:.acetaminophen **OR** acetaminophen, ondansetron **OR** ondansetron (ZOFRAN) IV, oxyCODONE   Anti-infectives (From admission, onward)    Start     Dose/Rate Route Frequency Ordered Stop   01/11/23 2200  ceFEPIme (MAXIPIME) 1 g in sodium chloride 0.9 % 100 mL IVPB        1 g 200 mL/hr over 30 Minutes Intravenous Every 24 hours 01/11/23 0134     01/10/23 2330  linezolid (ZYVOX) IVPB 600 mg        600 mg 300 mL/hr over 60 Minutes Intravenous Every 12 hours 01/10/23 2320     01/10/23 2000  ceFEPIme (MAXIPIME) 2 g in sodium chloride 0.9 % 100 mL IVPB  Status:  Discontinued        2 g 200 mL/hr over 30 Minutes Intravenous  Once 01/10/23 1955 01/10/23 1959   01/10/23 2000   metroNIDAZOLE (FLAGYL) IVPB 500 mg        500 mg 100 mL/hr over 60 Minutes Intravenous  Once 01/10/23 1955 01/10/23 2125   01/10/23 2000  ceFEPIme (MAXIPIME) 1 g in sodium chloride 0.9 % 100 mL IVPB        1 g 200 mL/hr over 30 Minutes Intravenous  Once 01/10/23 1959 01/10/23 2241      Subjective: [name] today has no fevers, no emesis,  No chest pain,   - No shortness of breath -No palpitations, no dizziness  Objective: Vitals:   01/11/23 1505 01/11/23 1510 01/11/23 1715 01/11/23 1716  BP: 94/69  92/62   Pulse:      Resp:  17  20  Temp: 98.1 F (36.7 C)     TempSrc: Oral     SpO2: 100%  100%   Weight:      Height:        Intake/Output Summary (Last 24 hours) at 01/11/2023 1932 Last data filed at 01/11/2023 1633 Gross per 24 hour  Intake 300 ml  Output --  Net 300 ml   Filed Weights   01/11/23 1457  Weight: 96.5 kg   Physical Exam  Gen:- Awake Alert,   HEENT:- [place].AT, No sclera icterus Neck-Supple Neck,No JVD,.  Lungs-  CTAB , fair symmetrical air movement CV- S1, S2 normal, regular  Abd-  +ve B.Sounds, Abd Soft, No tenderness,    Extremity/Skin:- No  edema, pedal pulses present  Psych-affect is appropriate, oriented x3 Neuro-no new focal deficits, no tremors MSK-left arm AV fistula, right upper extremity neurovascular damage with contracture atrophy -  Media Information  Document Information  Photos  Lt leg  01/11/2023 08:53  Attached To:  Hospital Encounter on 01/10/23  Source Information  Roxan Hockey, MD  [facility]   Data Reviewed: I have personally reviewed following labs and imaging studies  CBC: Recent Labs  Lab 01/10/23 1935 01/11/23 0517  WBC 11.6* 11.3*  NEUTROABS 7.3 7.2  HGB 13.4 12.8*  HCT 45.3 44.2  MCV 97.4 100.9*  PLT 275 [id number]   Basic Metabolic Panel: Recent Labs  Lab 01/10/23 1935 01/11/23 0517  NA 138 137  K 4.3 4.2  CL 96* 100  CO2 25 23  GLUCOSE 90 83  BUN 33* 37*  CREATININE 8.99* 10.06*   CALCIUM 8.9 8.2*  MG  --  2.1   GFR: Estimated Creatinine Clearance: 10.3 mL/min (A) (by C-G formula based on SCr of 10.06 mg/dL (H)). Liver Function Tests: Recent Labs  Lab 01/10/23 1935 01/11/23 0517  AST 35 24  ALT 23 16  ALKPHOS 182*  151*  BILITOT 0.7 0.8  PROT 8.2* 7.0  ALBUMIN 3.3* 2.8*   Recent Results (from the past 240 hour(s))  Culture, blood (Routine X 2) w Reflex to ID Panel     Status: None (Preliminary result)   Collection Time: 01/10/23  7:00 PM   Specimen: BLOOD RIGHT ARM  Result Value Ref Range Status   Specimen Description BLOOD RIGHT ARM  Final   Special Requests   Final    BOTTLES DRAWN AEROBIC AND ANAEROBIC Blood Culture adequate volume   Culture   Final    NO GROWTH < 24 HOURS Performed at [facility], [address] [place], [place] [address]    Report Status PENDING  Incomplete  Culture, blood (Routine x 2)     Status: None (Preliminary result)   Collection Time: 01/10/23  7:35 PM   Specimen: BLOOD  Result Value Ref Range Status   Specimen Description BLOOD BLOOD RIGHT WRIST  Final   Special Requests   Final    BOTTLES DRAWN AEROBIC AND ANAEROBIC Blood Culture adequate volume   Culture   Final    NO GROWTH < 24 HOURS Performed at [facility], [address], [place], [place] [address]    Report Status PENDING  Incomplete  Resp panel by RT-PCR (RSV, Flu A&B, Covid) Anterior Nasal Swab     Status: None   Collection Time: 01/10/23  8:18 PM   Specimen: Anterior Nasal Swab  Result Value Ref Range Status   SARS Coronavirus 2 by RT PCR NEGATIVE NEGATIVE Final    Comment: (NOTE) SARS-CoV-2 target nucleic acids are NOT DETECTED.  The SARS-CoV-2 RNA is generally detectable in upper respiratory specimens during the acute phase of infection. The lowest concentration of SARS-CoV-2 viral copies this assay can detect is 138 copies/mL. A negative result does not preclude SARS-Cov-2 infection and should not be used as the sole basis for treatment  or other patient management decisions. A negative result may occur with  improper specimen collection/handling, submission of specimen other than nasopharyngeal swab, presence of viral mutation(s) within the areas targeted by this assay, and inadequate number of viral copies(<138 copies/mL). A negative result must be combined with clinical observations, patient history, and epidemiological information. The expected result is Negative.  Fact Sheet for Patients:  EntrepreneurPulse.com.au  Fact Sheet for Healthcare Providers:  IncredibleEmployment.be  This test is no t yet approved or cleared by the [place] FDA and  has been authorized for detection and/or diagnosis of SARS-CoV-2 by FDA under an Emergency Use Authorization (EUA). This EUA will remain  in effect (meaning this test can be used) for the duration of the COVID-19 declaration under Section 564(b)(1) of the Act, 21 U.S.C.section 360bbb-3(b)(1), unless the authorization is terminated  or revoked sooner.       Influenza A by PCR NEGATIVE NEGATIVE Final   Influenza B by PCR NEGATIVE NEGATIVE Final    Comment: (NOTE) The Xpert Xpress SARS-CoV-2/FLU/RSV plus assay is intended as an aid in the diagnosis of influenza from Nasopharyngeal swab specimens and should not be used as a sole basis for treatment. Nasal washings and aspirates are unacceptable for Xpert Xpress SARS-CoV-2/FLU/RSV testing.  Fact Sheet for Patients: EntrepreneurPulse.com.au  Fact Sheet for Healthcare Providers: IncredibleEmployment.be  This test is not yet approved or cleared by the [place] FDA and has been authorized for detection and/or diagnosis of SARS-CoV-2 by FDA under an Emergency Use Authorization (EUA). This EUA will remain in effect (meaning this test can be used)  for the duration of the COVID-19 declaration under Section 564(b)(1) of the Act, 21 U.S.C. section  360bbb-3(b)(1), unless the authorization is terminated or revoked.     Resp Syncytial Virus by PCR NEGATIVE NEGATIVE Final    Comment: (NOTE) Fact Sheet for Patients: EntrepreneurPulse.com.au  Fact Sheet for Healthcare Providers: IncredibleEmployment.be  This test is not yet approved or cleared by the [place] FDA and has been authorized for detection and/or diagnosis of SARS-CoV-2 by FDA under an Emergency Use Authorization (EUA). This EUA will remain in effect (meaning this test can be used) for the duration of the COVID-19 declaration under Section 564(b)(1) of the Act, 21 U.S.C. section 360bbb-3(b)(1), unless the authorization is terminated or revoked.  Performed at [facility], [address] [place], [place] [address]     Radiology Studies: US Venous Img Lower Unilateral Left (DVT)  Result Date: 01/11/2023 CLINICAL DATA:  57 year old male with pain EXAM: LEFT LOWER EXTREMITY VENOUS DOPPLER ULTRASOUND TECHNIQUE: Gray-scale sonography with graded compression, as well as color Doppler and duplex ultrasound were performed to evaluate the lower extremity deep venous systems from the level of the common femoral vein and including the common femoral, femoral, profunda femoral, popliteal and calf veins including the posterior tibial, peroneal and gastrocnemius veins when visible. The superficial great saphenous vein was also interrogated. Spectral Doppler was utilized to evaluate flow at rest and with distal augmentation maneuvers in the common femoral, femoral and popliteal veins. COMPARISON:  None Available. FINDINGS: Contralateral Common Femoral Vein: Respiratory phasicity is normal and symmetric with the symptomatic side. No evidence of thrombus. Normal compressibility. Common Femoral Vein: No evidence of thrombus. Normal compressibility, respiratory phasicity and response to augmentation. Saphenofemoral Junction: No evidence of thrombus. Normal  compressibility and flow on color Doppler imaging. Profunda Femoral Vein: No evidence of thrombus. Normal compressibility and flow on color Doppler imaging. Femoral Vein: No evidence of thrombus. Normal compressibility, respiratory phasicity and response to augmentation. Popliteal Vein: No evidence of thrombus. Normal compressibility, respiratory phasicity and response to augmentation. Calf Veins: No evidence of thrombus. Normal compressibility and flow on color Doppler imaging. Superficial Great Saphenous Vein: No evidence of thrombus. Normal compressibility and flow on color Doppler imaging. Other Findings:  None. IMPRESSION: Directed duplex of the left lower extremity negative for DVT Signed, Dulcy Fanny. Nadene Rubins, RPVI [facility] Radiology Electronically Signed   By: Corrie Mckusick D.O.   On: 01/11/2023 09:39   DG Chest 2 View  Result Date: 01/10/2023 CLINICAL DATA:  Sepsis, calf pain EXAM: CHEST - 2 VIEW COMPARISON:  09/04/2022 FINDINGS: Chronic pleural thickening and confluent linear densities at the right lung base extending up into the right mid lung, not substantially changed from 09/04/2022, presumably primarily from scarring. Plate and screw fixator of the right humerus. Speckled metal densities/metal fragments along the right anterior chest, as before. Low lung volumes are present, causing crowding of the pulmonary vasculature. Upper normal heart size. The left lung appears clear. Exaggerated upward convex lobulation of both hemidiaphragms. IMPRESSION: 1. Chronic scarring at the right lung base extending up into the right mid lung, not substantially changed from 09/04/2022. 2. Low lung volumes. 3. Upper normal heart size. 4. Exaggerated upward convex lobulation of both hemidiaphragms. Electronically Signed   By: Van Clines M.D.   On: 01/10/2023 19:02   DG Tibia/Fibula Left  Result Date: 01/10/2023 CLINICAL DATA:  Calf pain, suspected  sepsis. EXAM: LEFT TIBIA AND FIBULA - 2 VIEW COMPARISON:  None Available.  FINDINGS: There is no evidence of fracture or other focal bone lesions. Extensive peripheral vascular calcifications are present. The soft tissues are otherwise within normal limits. IMPRESSION: Negative. Electronically Signed   By: Ronney Asters M.D.   On: 01/10/2023 19:00    Scheduled Meds:  aspirin EC  81 mg Oral q AM   atorvastatin  80 mg Oral QHS   calcium carbonate  1,500 mg Oral QHS   [START ON 01/12/2023] Chlorhexidine Gluconate Cloth  6 each Topical Q0600   famotidine  20 mg Oral QHS   heparin  5,000 Units Subcutaneous Q8H   midodrine  15 mg Oral TID WC   tamsulosin  0.4 mg Oral QPM   Continuous Infusions:  ceFEPime (MAXIPIME) IV     linezolid (ZYVOX) IV 300 mL/hr at 01/11/23 1633     LOS: 1 day   Roxan Hockey M.D on 01/11/2023 at 7:32 PM  Go to [email or web address] - for contact info  [facility] - Office  [phone]  If 7PM-7AM, please contact night-coverage [email or web address] 01/11/2023, 7:32 PM

## 2023-01-11 NOTE — Progress Notes (Signed)
Pt setup on home CPAP unit spo2 100%

## 2023-01-11 NOTE — TOC Progression Note (Signed)
  Transition of Care [facility]) Screening Note   Patient Details  Name: [name] Date of Birth: Feb 07, 1966   Transition of Care [facility]) CM/SW Contact:    Boneta Lucks, RN Phone Number: 01/11/2023, 1:36 PM    Transition of Care Department [facility]) has reviewed patient and no TOC needs have been identified at this time. We will continue to monitor patient advancement through interdisciplinary progression rounds. If new patient transition needs arise, please place a TOC consult.      Barriers to Discharge: Continued Medical Work up, Other (must enter comment) (awaiting inpatient bed)  Expected Discharge Plan and Services       Living arrangements for the past 2 months: [place]

## 2023-01-11 NOTE — Progress Notes (Signed)
Helped set up patient's home CPAP machine for patient.  Machine is at bedisde and plugged into red outlet and ready for patient to put on.  Also put in sterile water in patient's machine.

## 2023-01-11 NOTE — Progress Notes (Signed)
Pharmacy Antibiotic Note  [name] is a 57 y.o. male admitted on 01/10/2023 with sepsis.  Pharmacy has been consulted for Cefepime dosing. WBC 11.6. ESRD on HD.   Plan: Cefepime 1g IV q24h Zyvox per MD-appears to have true vancomycin allergy Trend WBC, temp, HD schedule F/U infectious work-up   Temp (24hrs), Avg:100.3 F (37.9 C), Min:98.5 F (36.9 C), Max:102 F (38.9 C)  Recent Labs  Lab 01/10/23 1935 01/10/23 2125  WBC 11.6*  --   CREATININE 8.99*  --   LATICACIDVEN 2.2* 1.9    CrCl cannot be calculated (Unknown ideal weight.).    Allergies  Allergen Reactions   Vancomycin Swelling    Lip swelling    Narda Bonds, PharmD, BCPS Clinical Pharmacist Phone: [phone]

## 2023-01-11 NOTE — ED Notes (Signed)
PT received lunch tray

## 2023-01-11 NOTE — Consult Note (Signed)
[name] Admit Date: 01/10/2023 01/11/2023 Rexene Agent Requesting Physician:  Renita Papa MD  Reason for Consult:  ESRD comanagement, Fever  HPI:  52M seen at the request of Dr. Denton Brick for the evaluation of fever and an ESRD patient.  Patient seen in the ED.  He was admitted overnight after presenting with 3 days of fevers including a maximum temperature of 102.4 Fahrenheit on the day of presentation, it was 39 F with initial evaluation in the ED.  Patient received dialysis using a left forearm AV fistula at [facility] in [place].  Last treatment was 2/26 and was uneventful.  Other past history includes a chronic ulcer of the left foot followed by plastic surgery; atrial fibrillation; GERD; history of DVT in the left lower extremity completed course of DOAC; hyperlipidemia; past transplant recipient currently remains on prednisone.  Workup thus far with pending blood cultures.  2 view chest x-ray with no evidence of pneumonia.  Patient placed on vancomycin/cefepime/Flagyl.  Also noted to be tachycardic and hypotensive so given 2 L IV fluids and remains on LR at 75 mL/h.  Blood pressures have come up some but he does remain tachycardic.  Patient endorses some discomfort in the abdomen but no clear diarrhea, bloody stools, nausea/vomiting.  No concern for infection of the lower extremity ulcer.  There have been some sick contacts with gastroenteritis like symptoms.  Outpatient dialysis records unavailable.  Based upon midodrine dosing it does appear that patient has significant intradialytic hypotension.  I suspect that current blood pressures are not far from his usual.  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    ESOPHAGOGASTRODUODENOSCOPY (EGD) WITH PROPOFOL N/A 03/31/2021   active bleeding in duodenum due to suspected AVM s/p hemostatic spray   GIVENS CAPSULE STUDY N/A 06/04/2021   Procedure: GIVENS CAPSULE STUDY;  Surgeon: Eloise Harman, DO;  [facility];  Service: Endoscopy;  Laterality: N/A;  7:30am   HEMOSTASIS CONTROL  03/31/2021   Procedure: HEMOSTASIS CONTROL;  Surgeon: Thornton Park, MD;  Location: [facility];  Service: Gastroenterology;;   HERNIA MESH REMOVAL     abdominal   INCISION AND DRAINAGE OF WOUND Left 10/21/2022   Procedure: IRRIGATION AND DEBRIDEMENT WOUND left leg wound with placement of myriad;  Surgeon: [name], MD;  Location: [facility];  Service: Plastics;  Laterality: Left;   INCISION AND DRAINAGE  OF WOUND Left 11/30/2022   Procedure: debridement and preparation of wound, left leg;  Surgeon: [name], MD;  Location: [facility];  Service: Plastics;  Laterality: Left;   LEFT ATRIAL APPENDAGE OCCLUSION  09/10/2021   SMALL BOWEL ENTEROSCOPY     Normal stomach, normal duodenum, 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   TRANSPLANTATION RENAL  2005   FH  Family History  Problem Relation Age of Onset   Hypertension Mother    Heart attack Father     Hypertension Maternal Grandmother    Stroke Maternal Grandfather    Heart attack Paternal Uncle    Heart attack Paternal Uncle    Heart attack Paternal Aunt    Stroke Maternal Uncle    [place]  reports that he has never smoked. He has never used smokeless tobacco. He reports that he does not drink alcohol and does not use drugs. Allergies  Allergies  Allergen Reactions   Vancomycin Swelling, Hives, Itching, Other (See Comments) and Rash    Lip swelling  Lip swelling, Reaction Type: Allergy; Reaction(s): swelling of lips  Reaction Type: Allergy; Reaction(s): swelling of lips   Home medications Prior to Admission medications   Medication Sig Start Date End Date Taking? Authorizing Provider  acetaminophen (TYLENOL) 500 MG tablet Take 1,000 mg by mouth every 6 (six) hours as needed (pain.).   Yes [provider]  allopurinol (ZYLOPRIM) 100 MG tablet Take 100 mg by mouth in the morning. 05/22/21  Yes [provider]  Ascorbic Acid (VITAMIN C PO) Take 500 mg by mouth in the morning.   Yes [provider]  aspirin EC 81 MG tablet Take 81 mg by mouth in the morning.   Yes [provider]  atorvastatin (LIPITOR) 80 MG tablet Take 80 mg by mouth at bedtime.   Yes [provider]  calcium carbonate (TUMS EX) 750 MG chewable tablet Chew 2 tablets by mouth at bedtime.   Yes [provider]  Cholecalciferol (VITAMIN D) 50 MCG (2000 UT) tablet Take 2,000 Units by mouth in the morning.   Yes [provider]  cyclobenzaprine (FLEXERIL) 10 MG tablet Take 10 mg by mouth daily as needed for muscle spasms. 06/11/21  Yes [provider]  famotidine (PEPCID) 20 MG tablet Take 20 mg by mouth at bedtime.   Yes [provider]  metoprolol tartrate (LOPRESSOR) 25 MG tablet Take 12.5 mg by mouth 2 (two) times daily.   Yes [provider]  midodrine (PROAMATINE) 5 MG tablet Take 3 tablets (15 mg total) by mouth 3 (three) times daily  with meals. Patient taking differently: Take 15-20 mg by mouth See admin instructions. Take 4 tablets (20 mg) by mouth in the morning before dialysis on Mondays, Wednesday & Fridays and Take 3 tablets (15 mg) by mouth during dialysis sessions on Mondays, Wednesdays & Fridays. 08/30/22  Yes Barton Dubois, MD  Multiple Vitamin (MULTIVITAMIN WITH MINERALS) TABS tablet Take 1 tablet by mouth in the morning.   Yes [provider]  pantoprazole (PROTONIX) 40 MG tablet Take 1 tablet (40 mg total) by mouth 2 (two) times daily. Patient taking differently: Take 40 mg by mouth daily before breakfast. 04/07/21  Yes Elgergawy, Silver Huguenin, MD  predniSONE (DELTASONE) 10 MG tablet Take 10 mg by mouth at bedtime.   Yes [provider]  tamsulosin (FLOMAX) 0.4 MG CAPS capsule Take 0.4 mg by mouth every evening.   Yes [provider]  amoxicillin (AMOXIL) 500 MG capsule  Take 2,000 mg by mouth See admin instructions. Take 4 capsules (2000 mg) by mouth 1 hour prior to dental appointments. Patient not taking: Reported on 01/11/2023 11/02/22   [provider]    Current Medications Scheduled Meds:  aspirin EC  81 mg Oral q AM   atorvastatin  80 mg Oral QHS   calcium carbonate  1,500 mg Oral QHS   famotidine  20 mg Oral QHS   heparin  5,000 Units Subcutaneous Q8H   midodrine  15 mg Oral TID WC   tamsulosin  0.4 mg Oral QPM   Continuous Infusions:  ceFEPime (MAXIPIME) IV     lactated ringers Stopped (01/10/23 2136)   linezolid (ZYVOX) IV     PRN Meds:.acetaminophen **OR** acetaminophen, ondansetron **OR** ondansetron (ZOFRAN) IV, oxyCODONE  CBC Recent Labs  Lab 01/10/23 1935 01/11/23 0517  WBC 11.6* 11.3*  NEUTROABS 7.3 7.2  HGB 13.4 12.8*  HCT 45.3 44.2  MCV 97.4 100.9*  PLT 275 [id number]   Basic Metabolic Panel Recent Labs  Lab 01/10/23 1935 01/11/23 0517  NA 138 137  K 4.3 4.2  CL 96* 100  CO2 25 23  GLUCOSE 90 83  BUN 33* 37*  CREATININE 8.99* 10.06*  CALCIUM  8.9 8.2*    Physical Exam  Blood pressure 101/76, pulse (!) 110, temperature 98.4 F (36.9 C), resp. rate 20, SpO2 100 %. GEN: NAD but chronically ill-appearing ENT: NCAT EYES: EOMI CV: Tachycardic, irregular PULM: Diminished in the bases, otherwise clear throughout ABD: Soft, nontender SKIN: Lower extremity ulcer bandaged, not examined, no other ulcers noted EXT: No significant edema VASCULAR: Left forearm AV fistula with bruit and thrill present   Assessment 89M ESRD MWF [facility] with 3d Fever, Sepsis  ESRD MWF [facility] via L FA AVF; current outpatient records unknown. Sepsis, fevers, source unclear; cultures pending.  On broad-spectrum antibiotics.  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   DVT workup pending.  Per TRH; remains on IV fluids A-fib with tachycardia, per TRH, driven by #2 partially History of DVT left lower extremity, repeat Dopplers pending today Mild leukocytosis, appears somewhat chronic Anemia, hemoglobin stable 12.8. CKD BMM: Phosphorus by patient report has been low lately and is off all binders calcium here is acceptable.  Plan HD tomorrow on schedule: AVF, 3.5h, 1-2L UF, Tight heparain, 2K, using midodrine with dialysis Daily weights, Daily Renal Panel, Strict I/Os, Avoid nephrotoxins (NSAIDs, judicious IV Contrast) Will follow along  Rexene Agent  01/11/2023, 9:37 AM

## 2023-01-11 NOTE — Consult Note (Addendum)
[facility] Nurse Consult Note: Reason for Consult: Consult requested for left leg.  Performed remotely after review of progress notes and photo in the EMR Wound type: Left lower leg with chronic full thickness wound, 90% beefy red, 10% yellow, with mod amt drainage.  Pt has been followed by Dr Nyoka Cowden of the plastics team as an outpatient, who recently assessed the wound appearance on 2/22. The measurements were 8.5X7cm.  Pt has been ordered to use Silver collagen dresings.  We do not carry this topical treatment in the [facility].  He plans for an eventual split thickness skin graft, according to the notes.  Topical treatment orders provided for the bedside nurses to perform as follows: Apply Aquacel to left leg and cover with ABB pad and kerlex Q day.  Moisten Aquacel with NS each time to assist with removal.  ** if family prefers to bring in a silver collagen dressing from home, use this instead. We do not carry them in the [facility].  Pt should resume follow-up with the plastics team after discharge.  Please re-consult if further assistance is needed.  Thank-you,  Julien Girt MSN, [place], [place], [place], [place]

## 2023-01-11 NOTE — ED Notes (Signed)
Lab in room.

## 2023-01-12 DIAGNOSIS — R6520 Severe sepsis without septic shock: Secondary | ICD-10-CM | POA: Diagnosis not present

## 2023-01-12 DIAGNOSIS — A419 Sepsis, unspecified organism: Secondary | ICD-10-CM | POA: Diagnosis not present

## 2023-01-12 LAB — BASIC METABOLIC PANEL
Anion gap: 15 (ref 5–15)
BUN: 49 mg/dL — ABNORMAL HIGH (ref 6–20)
CO2: 24 mmol/L (ref 22–32)
Calcium: 8.1 mg/dL — ABNORMAL LOW (ref 8.9–10.3)
Chloride: 97 mmol/L — ABNORMAL LOW (ref 98–111)
Creatinine, Ser: 12.71 mg/dL — ABNORMAL HIGH (ref 0.61–1.24)
GFR, Estimated: 4 mL/min — ABNORMAL LOW (ref >60)
Glucose, Bld: 124 mg/dL — ABNORMAL HIGH (ref 70–99)
Potassium: 4.3 mmol/L (ref 3.5–5.1)
Sodium: 136 mmol/L (ref 135–145)

## 2023-01-12 LAB — CBC
HCT: 39.8 % (ref 39.0–52.0)
Hemoglobin: 11.6 g/dL — ABNORMAL LOW (ref 13.0–17.0)
MCH: 28.9 pg (ref 26.0–34.0)
MCHC: 29.1 g/dL — ABNORMAL LOW (ref 30.0–36.0)
MCV: 99.3 fL (ref 80.0–100.0)
Platelets: 227 10*3/uL (ref 150–400)
RBC: 4.01 MIL/uL — ABNORMAL LOW (ref 4.22–5.81)
RDW: 17.3 % — ABNORMAL HIGH (ref 11.5–15.5)
WBC: 12.4 10*3/uL — ABNORMAL HIGH (ref 4.0–10.5)
nRBC: 0 % (ref 0.0–0.2)

## 2023-01-12 LAB — PROCALCITONIN: Procalcitonin: 5.42 ng/mL

## 2023-01-12 LAB — CORTISOL-AM, BLOOD: Cortisol - AM: 11.9 ug/dL (ref 6.7–22.6)

## 2023-01-12 LAB — C-REACTIVE PROTEIN: CRP: 20.5 mg/dL — ABNORMAL HIGH (ref <1.0)

## 2023-01-12 LAB — MRSA NEXT GEN BY PCR, NASAL: MRSA by PCR Next Gen: NOT DETECTED

## 2023-01-12 LAB — SEDIMENTATION RATE: Sed Rate: 79 mm/hr — ABNORMAL HIGH (ref 0–16)

## 2023-01-12 LAB — MAGNESIUM: Magnesium: 2.2 mg/dL (ref 1.7–2.4)

## 2023-01-12 MED ORDER — LACTATED RINGERS IV SOLN: INTRAVENOUS | Status: DC

## 2023-01-12 MED ORDER — AMIODARONE HCL 200 MG PO TABS
200.0000 mg | ORAL_TABLET | Freq: Two times a day (BID) | ORAL | Status: DC
  Administered 2023-01-12: 200 mg via ORAL
  Filled 2023-01-12: qty 1

## 2023-01-12 MED ORDER — ORAL CARE MOUTH RINSE: 15.0000 mL | OROMUCOSAL | Status: DC | PRN

## 2023-01-12 MED ORDER — DIGOXIN 0.25 MG/ML IJ SOLN: 0.2500 mg | Freq: Once | INTRAMUSCULAR | Status: DC

## 2023-01-12 NOTE — Progress Notes (Signed)
Pt transported to dialysis unit.

## 2023-01-12 NOTE — Progress Notes (Signed)
Patient already on home CPAP unit for the night. Uses independently.

## 2023-01-12 NOTE — Progress Notes (Signed)
Subjective:  No major events overnight-  BP soft but no pressors required, he says he feels much better than on admit-  due for HD today   Objective Vital signs in last 24 hours: Vitals:   01/12/23 0500 01/12/23 0600 01/12/23 0700 01/12/23 0728  BP: (!) 94/38 (!) 108/48 (!) 97/50   Pulse:      Resp: (!) 25 (!) 28 (!) 28   Temp:    98.1 F (36.7 C)  TempSrc:    Oral  SpO2:   99%   Weight:      Height:       Weight change:   Intake/Output Summary (Last 24 hours) at 01/12/2023 0750 Last data filed at 01/12/2023 0600 Gross per 24 hour  Intake 702.26 ml  Output --  Net 702.26 ml   HD orders at [facility]  MWF 3 hours 45 min using AVF- 15 gauge needles 400 BFR/500 DFR- 2/2.5 bath,  temp 36.5-  gets heparin 1000 load, then 200 per hour, also on venofer 50 per week   Assessment/ Plan: Pt is a 57 y.o. yo male with ESRD who was admitted on 01/10/2023 with fever, afib and hypotension  Assessment/Plan: 1. Fever-  suspicious for sepsis-  placed on broad spectrum abx-  now only cefepime-  cultures negative so far-  fever resolved 2. ESRD - normally MWF via AVF -  planning for routine HD today  3. Anemia-  does not appear to be an issue-  on iron and no ESA as OP 4. Secondary hyperparathyroidism-  not on active vit D as OP-  seems to be on calcium supp-  no binder listed on meds, he confirms no binder-  will check phos 5. HTN/volume- in afib and hypotensive-  his reg dose of midodrine has been increased to hopefully avoid pressors here.  Minimal fluid removal as tolerated with HD today -   he says he takes as much as 35 mg of midodrine on dialysis days-  pt seems knowledgeable about what he can handle volume removal wise and what he cant  [name]    Labs: Basic Metabolic Panel: Recent Labs  Lab 01/10/23 1935 01/11/23 0517  NA 138 137  K 4.3 4.2  CL 96* 100  CO2 25 23  GLUCOSE 90 83  BUN 33* 37*  CREATININE 8.99* 10.06*  CALCIUM 8.9 8.2*   Liver Function  Tests: Recent Labs  Lab 01/10/23 1935 01/11/23 0517  AST 35 24  ALT 23 16  ALKPHOS 182* 151*  BILITOT 0.7 0.8  PROT 8.2* 7.0  ALBUMIN 3.3* 2.8*   No results for input(s): "LIPASE", "AMYLASE" in the last 168 hours. No results for input(s): "AMMONIA" in the last 168 hours. CBC: Recent Labs  Lab 01/10/23 1935 01/11/23 0517  WBC 11.6* 11.3*  NEUTROABS 7.3 7.2  HGB 13.4 12.8*  HCT 45.3 44.2  MCV 97.4 100.9*  PLT 275 235   Cardiac Enzymes: No results for input(s): "CKTOTAL", "CKMB", "CKMBINDEX", "TROPONINI" in the last 168 hours. CBG: Recent Labs  Lab 01/11/23 2109  GLUCAP 96    Iron Studies: No results for input(s): "IRON", "TIBC", "TRANSFERRIN", "FERRITIN" in the last 72 hours. Studies/Results: US Venous Img Lower Unilateral Left (DVT)  Result Date: 01/11/2023 CLINICAL DATA:  57 year old male with pain EXAM: LEFT LOWER EXTREMITY VENOUS DOPPLER ULTRASOUND TECHNIQUE: Gray-scale sonography with graded compression, as well as color Doppler and duplex ultrasound were performed to evaluate the lower extremity deep venous systems from the level  of the common femoral vein and including the common femoral, femoral, profunda femoral, popliteal and calf veins including the posterior tibial, peroneal and gastrocnemius veins when visible. The superficial great saphenous vein was also interrogated. Spectral Doppler was utilized to evaluate flow at rest and with distal augmentation maneuvers in the common femoral, femoral and popliteal veins. COMPARISON:  None Available. FINDINGS: Contralateral Common Femoral Vein: Respiratory phasicity is normal and symmetric with the symptomatic side. No evidence of thrombus. Normal compressibility. Common Femoral Vein: No evidence of thrombus. Normal compressibility, respiratory phasicity and response to augmentation. Saphenofemoral Junction: No evidence of thrombus. Normal compressibility and flow on color Doppler imaging. Profunda Femoral Vein: No evidence  of thrombus. Normal compressibility and flow on color Doppler imaging. Femoral Vein: No evidence of thrombus. Normal compressibility, respiratory phasicity and response to augmentation. Popliteal Vein: No evidence of thrombus. Normal compressibility, respiratory phasicity and response to augmentation. Calf Veins: No evidence of thrombus. Normal compressibility and flow on color Doppler imaging. Superficial Great Saphenous Vein: No evidence of thrombus. Normal compressibility and flow on color Doppler imaging. Other Findings:  None. IMPRESSION: Directed duplex of the left lower extremity negative for DVT Signed, Dulcy Fanny. Nadene Rubins, RPVI [facility] Electronically Signed   By: Corrie Mckusick D.O.   On: 01/11/2023 09:39   DG Chest 2 View  Result Date: 01/10/2023 CLINICAL DATA:  Sepsis, calf pain EXAM: CHEST - 2 VIEW COMPARISON:  09/04/2022 FINDINGS: Chronic pleural thickening and confluent linear densities at the right lung base extending up into the right mid lung, not substantially changed from 09/04/2022, presumably primarily from scarring. Plate and screw fixator of the right humerus. Speckled metal densities/metal fragments along the right anterior chest, as before. Low lung volumes are present, causing crowding of the pulmonary vasculature. Upper normal heart size. The left lung appears clear. Exaggerated upward convex lobulation of both hemidiaphragms. IMPRESSION: 1. Chronic scarring at the right lung base extending up into the right mid lung, not substantially changed from 09/04/2022. 2. Low lung volumes. 3. Upper normal heart size. 4. Exaggerated upward convex lobulation of both hemidiaphragms. Electronically Signed   By: Van Clines M.D.   On: 01/10/2023 19:02   DG Tibia/Fibula Left  Result Date: 01/10/2023 CLINICAL DATA:  Calf pain, suspected sepsis. EXAM: LEFT TIBIA AND FIBULA - 2 VIEW COMPARISON:  None Available. FINDINGS:  There is no evidence of fracture or other focal bone lesions. Extensive peripheral vascular calcifications are present. The soft tissues are otherwise within normal limits. IMPRESSION: Negative. Electronically Signed   By: Ronney Asters M.D.   On: 01/10/2023 19:00   Medications: Infusions:  ceFEPime (MAXIPIME) IV 1 g (01/11/23 2255)   lactated ringers     linezolid (ZYVOX) IV 600 mg (01/11/23 2258)    Scheduled Medications:  aspirin EC  81 mg Oral q AM   atorvastatin  80 mg Oral QHS   calcium carbonate  1,500 mg Oral QHS   Chlorhexidine Gluconate Cloth  6 each Topical Q0600   diltiazem  30 mg Oral TID   famotidine  20 mg Oral QHS   heparin  5,000 Units Subcutaneous Q8H   midodrine  15 mg Oral TID WC   tamsulosin  0.4 mg Oral QPM    have reviewed scheduled and prn medications.  Physical Exam: General:  NAD Heart:irreg/tachy Lungs: mostly clear Abdomen: soft, non tender Extremities: no peripheral edema Dialysis Access: left forearm AVF-  no signs of infection    01/12/2023,7:50  AM  LOS: 2 days

## 2023-01-12 NOTE — Progress Notes (Addendum)
PROGRESS NOTE    Patient: [name]                            PCP: Gardiner Rhyme, MD                    DOB: [birth year]            DOA: 01/10/2023 [id number]             DOS: 01/12/2023, 11:30 AM   LOS: 2 days   Date of Service: The patient was seen and examined on 01/12/2023  Subjective:   The patient was seen and examined this morning. Mildly hypotensive this morning, tachycardic Otherwise awake alert, not complaining of any fever chills nausea vomiting or chest pain  Brief Narrative:   [name] is a 57 y.o. male with medical history significant of atrial fibrillation, CHF, ESRD on dialysis Monday Wednesday Friday, DVT of left lower extremity, GERD, HLD/ HTN, admitted on 01/10/2023 for with A-fib with RVR and soft BP and concerns about sepsis     Assessment and Plan:  1)Sepsis -SIRS--POA Met SIRS criteria on admission Vitals this morning: Blood pressure 92/70, pulse (!) 143, temperature 98.1 F (36.7 C),RR: (!) 35,  SpO2 (!) 85 %.  -Ruling out sepsis/bacteremia  -- Negative COVID, flu, RSV - Blood cultures pending -Procalcitonin 6.6 >> 5.42 - WBC 11.6 >>> 11.3   - Lactic acid was 2.2 >> 2.9, 1.2   -Patient initially received cefepime Flagyl and vancomycin --Continue cefepime, Zyvox - Patient received IV fluid boluses--patient does not make urine in the setting of ESRD so he is  high risk for volume overload - Chest x-ray shows no infectious findings - Patient has a well-healing wound on the left lower extremity that does not look infected  -Midodrine orally -the patient was on midodrine on hemodialysis days, changed to 3 times daily every day now given soft BP  -  will consider Levophed if needed for pressure support - Cortisol levels 11.9  01/12/2023: Stable this am:     2)ESRD on dialysis [facility] - Dialysis Monday - Wednesday - Friday - Did Not miss any hemodialysis - -Plans for hemodialysis on 01/12/2023    3)Atrial fibrillation with rapid  ventricular response (HCC) -oral Cardizem as BP allows -- Patient has Watchman device - Patient does take metoprolol on non-HD days    GERD (gastroesophageal reflux disease) - Continue Pepcid   Mixed hyperlipidemia - Continue statin   History of DVT (deep vein thrombosis) - History of DVT in the left lower extremity - Finish 90 days of Eliquis - Complains of left leg swelling, repeat venous Dopplers on 01/11/2023 without acute DVT   Hypotension -Midodrine orally -the patient was on midodrine on hemodialysis days, changed to 3 times daily every day now given soft BP -  consider Levophed if needed for pressure support -Check a.m. cortisol levels   Status is: Inpatient     Assessment & Plan:   Principal Problem:   Sepsis ([place]) Active Problems:   ESRD on dialysis ([place])   GERD (gastroesophageal reflux disease)   Mixed hyperlipidemia   Atrial fibrillation with rapid ventricular response (HCC)   Hypotension   History of DVT (deep vein thrombosis)     ------------------------------------------------------------------------------------------------------------------------- Nutritional status:  The patient's BMI is: Body mass index is 28.07 kg/m. I agree with the assessment and plan as outlined below: Nutrition Status:  Skin Assessment: I have examined  the patient's skin and I agree with the wound assessment as performed by wound care team As outlined belowe:  -------------------------------------------------------------------------------------------------------------------- Cultures; Blood Cultures x 2 >> NGT Urine Culture  >>> NGT  Sputum Culture >> NGT    ------------------------------------------------------------------------------------------------------------------------------------------------  DVT prophylaxis:  heparin injection 5,000 Units Start: 01/11/23 0600 SCDs Start: 01/11/23 [id number]   Code Status:   Code Status: Full Code  Family Communication: No  family member present at bedside- attempt will be made to update daily The above findings and plan of care has been discussed with patient (and family)  in detail,  they expressed understanding and agreement of above. -Advance care planning has been discussed.   Admission status:   Status is: Inpatient Remains inpatient appropriate because: Needing further evaluation and treatment for IV antibiotics meeting sepsis/SIRS criteria, hemodialysis, rate control, hypotensive   Disposition: From  - home             Planning for discharge in 1-2 days: to   Procedures:   No admission procedures for hospital encounter.   Antimicrobials:  Anti-infectives (From admission, onward)    Start     Dose/Rate Route Frequency Ordered Stop   01/11/23 2200  ceFEPIme (MAXIPIME) 1 g in sodium chloride 0.9 % 100 mL IVPB        1 g 200 mL/hr over 30 Minutes Intravenous Every 24 hours 01/11/23 0134     01/10/23 2330  linezolid (ZYVOX) IVPB 600 mg        600 mg 300 mL/hr over 60 Minutes Intravenous Every 12 hours 01/10/23 2320     01/10/23 2000  ceFEPIme (MAXIPIME) 2 g in sodium chloride 0.9 % 100 mL IVPB  Status:  Discontinued        2 g 200 mL/hr over 30 Minutes Intravenous  Once 01/10/23 1955 01/10/23 1959   01/10/23 2000  metroNIDAZOLE (FLAGYL) IVPB 500 mg        500 mg 100 mL/hr over 60 Minutes Intravenous  Once 01/10/23 1955 01/10/23 2125   01/10/23 2000  ceFEPIme (MAXIPIME) 1 g in sodium chloride 0.9 % 100 mL IVPB        1 g 200 mL/hr over 30 Minutes Intravenous  Once 01/10/23 1959 01/10/23 2241        Medication:   aspirin EC  81 mg Oral q AM   atorvastatin  80 mg Oral QHS   calcium carbonate  1,500 mg Oral QHS   Chlorhexidine Gluconate Cloth  6 each Topical Q0600   diltiazem  30 mg Oral TID   famotidine  20 mg Oral QHS   heparin  5,000 Units Subcutaneous Q8H   midodrine  15 mg Oral TID WC   tamsulosin  0.4 mg Oral QPM    acetaminophen **OR** acetaminophen, ondansetron **OR**  ondansetron (ZOFRAN) IV, mouth rinse, oxyCODONE   Objective:   Vitals:   01/12/23 0900 01/12/23 1000 01/12/23 1057 01/12/23 1100  BP: (!) 90/46 (!) 98/57  92/70  Pulse:    (!) 143  Resp: (!) 37 (!) 29  (!) 35  Temp:   98.1 F (36.7 C)   TempSrc:   Oral   SpO2:  100%  (!) 85%  Weight:      Height:        Intake/Output Summary (Last 24 hours) at 01/12/2023 1130 Last data filed at 01/12/2023 0959 Gross per 24 hour  Intake 902.26 ml  Output --  Net 902.26 ml   Filed Weights   01/11/23 1457  Weight:  96.5 kg     Physical examination:   Constitution:  Alert, cooperative, no distress,  Appears calm and comfortable  Psychiatric:   Normal and stable mood and affect, cognition intact,   HEENT:        Normocephalic, PERRL, otherwise with in Normal limits  Chest:         Chest symmetric Cardio vascular:  S1/S2, RRR, No murmure, No Rubs or Gallops  pulmonary: Clear to auscultation bilaterally, respirations unlabored, negative wheezes / crackles Abdomen: Soft, non-tender, non-distended, bowel sounds,no masses, no organomegaly Muscular skeletal: Limited exam - in bed, able to move all 4 extremities,   Neuro: CNII-XII intact. , normal motor and sensation, reflexes intact  Extremities: No pitting edema lower extremities, +2 pulses  Skin: Dry, warm to touch, negative for any Rashes, No open wounds Wounds: per nursing documentation   ------------------------------------------------------------------------------------------------------------------------------------------    LABs:     Latest Ref Rng & Units 01/11/2023    5:17 AM 01/10/2023    7:35 PM 11/30/2022   12:35 PM  CBC  WBC 4.0 - 10.5 K/uL 11.3  11.6    Hemoglobin 13.0 - 17.0 g/dL 12.8  13.4  14.6   Hematocrit 39.0 - 52.0 % 44.2  45.3  43.0   Platelets 150 - 400 K/uL 235  275        Latest Ref Rng & Units 01/11/2023    5:17 AM 01/10/2023    7:35 PM 11/30/2022   12:35 PM  CMP  Glucose 70 - 99 mg/dL 83  90  82   BUN 6 -  20 mg/dL 37  33  87   Creatinine 0.61 - 1.24 mg/dL 10.06  8.99  13.10   Sodium 135 - 145 mmol/L 137  138  139   Potassium 3.5 - 5.1 mmol/L 4.2  4.3  5.3   Chloride 98 - 111 mmol/L 100  96  101   CO2 22 - 32 mmol/L 23  25    Calcium 8.9 - 10.3 mg/dL 8.2  8.9    Total Protein 6.5 - 8.1 g/dL 7.0  8.2    Total Bilirubin 0.3 - 1.2 mg/dL 0.8  0.7    Alkaline Phos 38 - 126 U/L 151  182    AST 15 - 41 U/L 24  35    ALT 0 - 44 U/L 16  23         Micro Results Recent Results (from the past 240 hour(s))  Culture, blood (Routine X 2) w Reflex to ID Panel     Status: None (Preliminary result)   Collection Time: 01/10/23  7:00 PM   Specimen: BLOOD RIGHT ARM  Result Value Ref Range Status   Specimen Description BLOOD RIGHT ARM  Final   Special Requests   Final    BOTTLES DRAWN AEROBIC AND ANAEROBIC Blood Culture adequate volume   Culture   Final    NO GROWTH 2 DAYS Performed at [facility], [address], [place], [place] [address]    Report Status PENDING  Incomplete  Culture, blood (Routine x 2)     Status: None (Preliminary result)   Collection Time: 01/10/23  7:35 PM   Specimen: BLOOD  Result Value Ref Range Status   Specimen Description BLOOD BLOOD RIGHT WRIST  Final   Special Requests   Final    BOTTLES DRAWN AEROBIC AND ANAEROBIC Blood Culture adequate volume   Culture   Final    NO GROWTH 2 DAYS Performed at [facility], [address], [place], [place] [address]    Report Status PENDING  Incomplete  Resp panel by RT-PCR (RSV, Flu A&B, Covid) Anterior Nasal Swab     Status: None   Collection Time: 01/10/23  8:18 PM   Specimen: Anterior Nasal Swab  Result Value Ref Range Status   SARS Coronavirus 2 by RT PCR NEGATIVE NEGATIVE Final    Comment: (NOTE) SARS-CoV-2 target nucleic acids are NOT DETECTED.  The SARS-CoV-2 RNA is generally detectable in upper respiratory specimens during the acute phase of infection. The lowest concentration of SARS-CoV-2 viral copies this assay  can detect is 138 copies/mL. A negative result does not preclude SARS-Cov-2 infection and should not be used as the sole basis for treatment or other patient management decisions. A negative result may occur with  improper specimen collection/handling, submission of specimen other than nasopharyngeal swab, presence of viral mutation(s) within the areas targeted by this assay, and inadequate number of viral copies(<138 copies/mL). A negative result must be combined with clinical observations, patient history, and epidemiological information. The expected result is Negative.  Fact Sheet for Patients:  EntrepreneurPulse.com.au  Fact Sheet for Healthcare Providers:  IncredibleEmployment.be  This test is no t yet approved or cleared by the [place] FDA and  has been authorized for detection and/or diagnosis of SARS-CoV-2 by FDA under an Emergency Use Authorization (EUA). This EUA will remain  in effect (meaning this test can be used) for the duration of the COVID-19 declaration under Section 564(b)(1) of the Act, 21 U.S.C.section 360bbb-3(b)(1), unless the authorization is terminated  or revoked sooner.       Influenza A by PCR NEGATIVE NEGATIVE Final   Influenza B by PCR NEGATIVE NEGATIVE Final    Comment: (NOTE) The Xpert Xpress SARS-CoV-2/FLU/RSV plus assay is intended as an aid in the diagnosis of influenza from Nasopharyngeal swab specimens and should not be used as a sole basis for treatment. Nasal washings and aspirates are unacceptable for Xpert Xpress SARS-CoV-2/FLU/RSV testing.  Fact Sheet for Patients: EntrepreneurPulse.com.au  Fact Sheet for Healthcare Providers: IncredibleEmployment.be  This test is not yet approved or cleared by the [place] FDA and has been authorized for detection and/or diagnosis of SARS-CoV-2 by FDA under an Emergency Use Authorization (EUA). This EUA will  remain in effect (meaning this test can be used) for the duration of the COVID-19 declaration under Section 564(b)(1) of the Act, 21 U.S.C. section 360bbb-3(b)(1), unless the authorization is terminated or revoked.     Resp Syncytial Virus by PCR NEGATIVE NEGATIVE Final    Comment: (NOTE) Fact Sheet for Patients: EntrepreneurPulse.com.au  Fact Sheet for Healthcare Providers: IncredibleEmployment.be  This test is not yet approved or cleared by the [place] FDA and has been authorized for detection and/or diagnosis of SARS-CoV-2 by FDA under an Emergency Use Authorization (EUA). This EUA will remain in effect (meaning this test can be used) for the duration of the COVID-19 declaration under Section 564(b)(1) of the Act, 21 U.S.C. section 360bbb-3(b)(1), unless the authorization is terminated or revoked.  Performed at [facility], [address] [place], [place] [address]     Radiology Reports No results found.  SIGNED: Deatra James, MD, FHM. FAAFP. Zacarias Pontes - [facility] Time spent > 35 min.  In seeing, evaluating and examining the patient. Reviewing medical records, labs, drawn plan of care. [facility],  Pager (please use [email or web address] to page/ text) Please use Epic Secure Chat for non-urgent communication (7AM-7PM)  If 7PM-7AM, please contact  night-coverage [email or web address], 01/12/2023, 11:30 AM

## 2023-01-12 NOTE — Progress Notes (Signed)
PT Cancellation Note  Patient Details Name: Balke Mettert MRN: [id number] DOB: 04-10-1966   Cancelled Treatment:    Reason Eval/Treat Not Completed: Patient at procedure or test/unavailable.  Out to dialysis, will check back tomorrow.   3:12 PM, 01/12/23 Lonell Grandchild, MPT Physical Therapist with [facility] [phone] office [phone] mobile phone

## 2023-01-12 NOTE — Progress Notes (Signed)
Pt is at dialysis, watching patient HR on the monitor and it is going up to the 160's. On the monitor it shows rhythm changes between SVT and A-fib with RVR. Dr. Roger Shelter made aware, consulted cardiology and placed order for 200 mg Amio oral to be given BID. Will take first dose to patient while he is in dialysis.

## 2023-01-12 NOTE — Progress Notes (Signed)
Received patient in bed to unit.  Alert and oriented.  Informed consent signed and in chart.   [place] duration:3.5. Pt had HR 150- 160 during [place]. Pt no c/o.  Patient tolerated well.  Transported back to the room  Alert, without acute distress.  Hand-off given to patient's nurse.   Access used: AVF Access issues: none  Total UF removed: 2 L Medication(s) given: midodrine 15 mg PO Post HD VS: 109/58 P 106 R 18 Post HD weight: 94.5 kg   [facility]

## 2023-01-12 NOTE — Progress Notes (Addendum)
Pt has had a HR sustaining in the 140's. EKG performed and showed SVT. MD made aware, ordered lab work for patient.

## 2023-01-12 NOTE — Hospital Course (Addendum)
[name] is a 57 y.o. male with medical history significant of atrial fibrillation (Watchman device implanted), CHF, ESRD on dialysis MWF, DVT of left lower extremity, arterial ulcer of RLE, GERD, HLD, and chronic hypotension on midodrine admitted on 2/26 for A-fib with RVR and soft BP, meeting SIRS criteria for sepsis.  Admitted to ICU at [facility].  Patient had multiple runs of SVT and Cardiology was consulted; recommended amiodarone drip.  Patient continued to have SVT runs intermittently. Nephrology consulted for HD and WOC consulted for care of arterial ulcer on RLE.   Started cefepime, metronidazole, and linezolid on admission, then narrowed to cefepime and linezolid 2/28, but refevered on 2/29.  New blood cultures were obtained on 2/29 due to refevering.  In parallel, WBC continued to trend up from admission through 3/1, then stabilized.  Infectious disease consulted and recommended TTE.  CXR did show some questionable right lung base pleural effusion versus consolidation consistent with possible pneumonia.  MRSA PCR negative and linezolid stopped.  Patient's wife insisted on discontinuing cefepime in favor of ceftriaxone.  Patient refevered again 3/3.

## 2023-01-13 ENCOUNTER — Encounter: Payer: Self-pay | Admitting: Plastic Surgery

## 2023-01-13 ENCOUNTER — Encounter (HOSPITAL_COMMUNITY): Payer: Self-pay | Admitting: Family Medicine

## 2023-01-13 DIAGNOSIS — I959 Hypotension, unspecified: Secondary | ICD-10-CM | POA: Diagnosis not present

## 2023-01-13 DIAGNOSIS — I471 Supraventricular tachycardia, unspecified: Secondary | ICD-10-CM

## 2023-01-13 DIAGNOSIS — I480 Paroxysmal atrial fibrillation: Secondary | ICD-10-CM

## 2023-01-13 DIAGNOSIS — E785 Hyperlipidemia, unspecified: Secondary | ICD-10-CM | POA: Diagnosis not present

## 2023-01-13 DIAGNOSIS — A419 Sepsis, unspecified organism: Secondary | ICD-10-CM | POA: Diagnosis not present

## 2023-01-13 DIAGNOSIS — R6520 Severe sepsis without septic shock: Secondary | ICD-10-CM | POA: Diagnosis not present

## 2023-01-13 LAB — CBC
HCT: 37.9 % — ABNORMAL LOW (ref 39.0–52.0)
Hemoglobin: 11.2 g/dL — ABNORMAL LOW (ref 13.0–17.0)
MCH: 29 pg (ref 26.0–34.0)
MCHC: 29.6 g/dL — ABNORMAL LOW (ref 30.0–36.0)
MCV: 98.2 fL (ref 80.0–100.0)
Platelets: 255 10*3/uL (ref 150–400)
RBC: 3.86 MIL/uL — ABNORMAL LOW (ref 4.22–5.81)
RDW: 17.4 % — ABNORMAL HIGH (ref 11.5–15.5)
WBC: 14.1 10*3/uL — ABNORMAL HIGH (ref 4.0–10.5)
nRBC: 0 % (ref 0.0–0.2)

## 2023-01-13 LAB — BASIC METABOLIC PANEL
Anion gap: 12 (ref 5–15)
BUN: 32 mg/dL — ABNORMAL HIGH (ref 6–20)
CO2: 26 mmol/L (ref 22–32)
Calcium: 7.9 mg/dL — ABNORMAL LOW (ref 8.9–10.3)
Chloride: 95 mmol/L — ABNORMAL LOW (ref 98–111)
Creatinine, Ser: 9.75 mg/dL — ABNORMAL HIGH (ref 0.61–1.24)
GFR, Estimated: 6 mL/min — ABNORMAL LOW (ref >60)
Glucose, Bld: 91 mg/dL (ref 70–99)
Potassium: 3.7 mmol/L (ref 3.5–5.1)
Sodium: 133 mmol/L — ABNORMAL LOW (ref 135–145)

## 2023-01-13 LAB — LACTIC ACID, PLASMA
Lactic Acid, Venous: 3.4 mmol/L (ref 0.5–1.9)
Lactic Acid, Venous: 1.5 mmol/L (ref 0.5–1.9)

## 2023-01-13 MED ORDER — LACTATED RINGERS IV BOLUS
1000.0000 mL | Freq: Once | INTRAVENOUS | Status: AC
  Administered 2023-01-13: 1000 mL via INTRAVENOUS

## 2023-01-13 MED ORDER — SODIUM CHLORIDE 0.9 % IV SOLN
250.0000 mL | INTRAVENOUS | Status: DC
  Administered 2023-01-13: 250 mL via INTRAVENOUS

## 2023-01-13 MED ORDER — METRONIDAZOLE 500 MG/100ML IV SOLN
500.0000 mg | Freq: Two times a day (BID) | INTRAVENOUS | Status: DC
  Administered 2023-01-13 – 2023-01-19 (×12): 500 mg via INTRAVENOUS
  Filled 2023-01-13 (×12): qty 100

## 2023-01-13 MED ORDER — NOREPINEPHRINE 4 MG/250ML-% IV SOLN: 0.0000 ug/min | INTRAVENOUS | Status: DC

## 2023-01-13 MED ORDER — AMIODARONE HCL IN DEXTROSE 360-4.14 MG/200ML-% IV SOLN
30.0000 mg/h | INTRAVENOUS | Status: DC
  Administered 2023-01-13 – 2023-01-15 (×6): 30 mg/h via INTRAVENOUS
  Administered 2023-01-16 – 2023-01-17 (×5): 60 mg/h via INTRAVENOUS
  Administered 2023-01-17: 30 mg/h via INTRAVENOUS
  Administered 2023-01-18: 60 mg/h via INTRAVENOUS
  Administered 2023-01-18 (×2): 30 mg/h via INTRAVENOUS
  Administered 2023-01-19 – 2023-01-20 (×4): 60 mg/h via INTRAVENOUS
  Administered 2023-01-20 – 2023-01-22 (×4): 30 mg/h via INTRAVENOUS
  Filled 2023-01-13 (×27): qty 200

## 2023-01-13 MED ORDER — AMIODARONE HCL IN DEXTROSE 360-4.14 MG/200ML-% IV SOLN
60.0000 mg/h | INTRAVENOUS | Status: AC
  Administered 2023-01-13 (×2): 60 mg/h via INTRAVENOUS

## 2023-01-13 MED ORDER — NOREPINEPHRINE 4 MG/250ML-% IV SOLN
2.0000 ug/min | INTRAVENOUS | Status: AC
  Administered 2023-01-13: 2 ug/min via INTRAVENOUS
  Administered 2023-01-14: 6 ug/min via INTRAVENOUS
  Administered 2023-01-14: 10 ug/min via INTRAVENOUS
  Administered 2023-01-15: 4 ug/min via INTRAVENOUS
  Filled 2023-01-13 (×4): qty 250

## 2023-01-13 MED ORDER — CHLORHEXIDINE GLUCONATE CLOTH 2 % EX PADS
6.0000 | MEDICATED_PAD | Freq: Every day | CUTANEOUS | Status: DC
  Administered 2023-01-14: 6 via TOPICAL

## 2023-01-13 MED ORDER — AMIODARONE LOAD VIA INFUSION
150.0000 mg | Freq: Once | INTRAVENOUS | Status: AC
  Administered 2023-01-13: 150 mg via INTRAVENOUS
  Filled 2023-01-13: qty 83.34

## 2023-01-13 NOTE — Consult Note (Addendum)
Cardiology Consult    [name] MRN: [id number], DOB/AGE: 07-28-1966   Admit date: 01/10/2023 Date of Consult: 01/13/2023  Primary Physician: Gardiner Rhyme, MD Primary Cardiologist: [place], [place] Requesting Provider: Cherlyn Labella, MD  Patient Profile    [name] is a 57 y.o. male with a history of CAD, hypotension, HL, ESRD, HFpEF, PAF, GIB, L Leg DVT (08/2022), and PAH, who is being seen today for the evaluation of PAF/PSVT at the request of Dr. Roger Shelter.  Past Medical History   Past Medical History:  Diagnosis Date   Chronic heart failure with preserved ejection fraction (HFpEF) ([place])    a. 03/2017 Echo: EF 55-65%, dil RV, RVSP 96mHg, ml RV fxn, mild TR; b. 12/2019 Echo: EF 60-65%, mildly reduced RV fxn, mild BAE, No siginif valvular dzs; c. 12/2021 RHC ([name]): RA 9, RV 57/14, PCWP 16, PA 50/25 (33). CO/CI ([place]) 4.44/2.0.   Coronary artery disease    a. 10/2020 MV: prominently fixed apical defect w/ mod inflat ischemia. Inlat HK. Nl RV fxn; b. 03/2021 PCI LAD.   DVT (deep venous thrombosis) ([place] 08/30/2022   a. L Leg-->completed 3 mos eliquis.   ESRD (end stage renal disease) ([facility]    a.  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 s/p PVI 2016; b. CHA2DS2VASc = 3--> was on warfarin and then eliquis-->08/2021 s/p Watchman device following GIB 03/2021.   Pneumonia    Pulmonary hypertension ([facility]    Sleep apnea    uses bipap nightly    Past Surgical History:  Procedure Laterality Date   ABLATION     APPLICATION OF WOUND VAC Left 11/30/2022   Procedure: APPLICATION OF WOUND VAC;  Surgeon: TCamillia Herter MD;  [place] [facility]  Service: Plastics;  Laterality: Left;   CHOLECYSTECTOMY     COLONOSCOPY WITH PROPOFOL N/A 12/09/2020    non-bleeding internal hemorrhoids, sigmoid and descending colon diverticulosis, stool in entire examined colon.    ESOPHAGOGASTRODUODENOSCOPY (EGD) WITH PROPOFOL N/A 03/31/2021   active bleeding in duodenum due to suspected AVM s/p hemostatic spray   GIVENS CAPSULE STUDY N/A 06/04/2021   Procedure: GIVENS CAPSULE STUDY;  Surgeon: CEloise Harman DO;  [facility];  Service: Endoscopy;  Laterality: N/A;  7:30am   HEMOSTASIS CONTROL  03/31/2021   Procedure: HEMOSTASIS CONTROL;  Surgeon: BThornton Park MD;  [place] [facility]  Service: Gastroenterology;;   HERNIA MESH REMOVAL     abdominal   INCISION AND DRAINAGE OF WOUND Left 10/21/2022   Procedure: IRRIGATION AND DEBRIDEMENT WOUND left leg wound with placement of myriad;  Surgeon: TCamillia Herter MD;  [place] [facility]  Service: Plastics;  Laterality: Left;   INCISION AND DRAINAGE OF WOUND Left 11/30/2022   Procedure: debridement and preparation of wound, left leg;  Surgeon: TCamillia Herter MD;  [place] [facility]  Service: Plastics;  Laterality: Left;   LEFT ATRIAL APPENDAGE OCCLUSION  09/10/2021   SMALL BOWEL ENTEROSCOPY     Normal stomach, normal duodenum, 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   TRANSPLANTATION RENAL  2005     Allergies  Allergies  Allergen Reactions   Vancomycin Swelling, Hives, Itching, Other (See Comments) and Rash    Lip swelling  Lip swelling,  Reaction Type: Allergy; Reaction(s): swelling of lips  Reaction Type: Allergy; Reaction(s): swelling of lips    History of Present Illness    57 year old male with a history of CAD, hypertension, hyperlipidemia, end-stage renal disease, chronic HFpEF, paroxysmal atrial fibrillation, GI bleed status post watchman, left leg DVT (October 2023), and pulmonary hypertension.  He initially underwent renal transplant approximately 2005 with subsequent failure.  He has since been on hemodialysis but over the past year, has been undergoing workup for repeat  transplant through [facility].  He notes a long history of atrial fibrillation previously on warfarin and subsequently Eliquis.  He says that his A-fib has been paroxysmal historically and that he typically goes into A-fib during periods of other illness.  He does not typically know when he is in A-fib except when he is tachycardic.  Echo October 2021 showed an EF of 60 to 65% with biatrial enlargement, mildly reduced RV function, and no significant valvular disease.  As part of pretransplant workup, he underwent stress testing in late 2021, which showed prominently fixed apical defect with moderate inferolateral ischemia.  This led to diagnostic catheterization May 2022 which showed severe LAD disease and this was successfully treated with a drug-eluting stent in [place].  Following stenting, he was placed on prasugrel in addition to Eliquis for A-fib.  A few days after stent placement in May 2022, he was admitted to [facility] following a syncopal episode with 3 unit GI bleed and anemia.  Prasugrel was changed to Plavix and Eliquis was held.  He subsequently followed up with his outpatient cardiologist and underwent watchman implant (08/2021).  A right heart catheterization in early 2023 showed pulmonary hypertension (details and past medical history above).  In October 2023, he was admitted for left lower extremity DVT and was placed on Eliquis x 3 months.  He has since completed his course.  He did not experience any recurrent GI bleeding during that time.  Mr. Marczak has been dealing with an ulceration on his left calf.  On approximate February 24, he started noting generalized malaise and low-grade fever.  On February 26, fever rose to 102.4 and he presented to [facility] emergency department.  Here, ECG showed A-fib at 133 bpm without acute ST or T changes.  Labs notable for mild leukocytosis at 11.6.  Lactate was up to 2.2.  He was admitted and placed on antibiotics.   Patient was relatively asymptomatic from A-fib standpoint.  Pressures to soft metoprolol Cardizem.  He was continued on midodrine.  On February 28, he began experiencing runs of SVT into the 150s.  This was associated with tachypalpitations.  He was initially placed on oral amiodarone and overnight, in the setting of ongoing tachycardia, this was transitioned to IV amiodarone.  Review of telemetry shows periods of sinus tachycardia, paroxysmal atrial fibrillation, and also runs of SVT up to the 150s with subsequent breaks to A-fib in the low 100s.  Currently, patient is in sinus tachycardia and is asymptomatic.  Inpatient Medications     amiodarone  200 mg Oral BID   aspirin EC  81 mg Oral q AM   atorvastatin  80 mg Oral QHS   calcium carbonate  1,500 mg Oral QHS   Chlorhexidine Gluconate Cloth  6 each Topical Q0600   diltiazem  30 mg Oral TID   famotidine  20 mg Oral QHS   heparin  5,000 Units Subcutaneous Q8H   midodrine  15 mg Oral TID WC  tamsulosin  0.4 mg Oral QPM    Family History    Family History  Problem Relation Age of Onset   Hypertension Mother    Heart attack Father    Hypertension Maternal Grandmother    Stroke Maternal Grandfather    Heart attack Paternal Uncle    Heart attack Paternal Uncle    Heart attack Paternal Aunt    Stroke Maternal Uncle    He indicated that his mother is alive. He indicated that his father is deceased. He indicated that the status of his maternal grandmother is unknown. He indicated that the status of his maternal grandfather is unknown. He indicated that the status of his maternal uncle is unknown. He indicated that his paternal aunt is deceased. He indicated that both of his paternal uncles are alive.   Social History    Social History   Socioeconomic History   Marital status: Married    Spouse name: Not on file   Number of children: 4   Years of education: Not on file   Highest education level: Not on file  Occupational History    Occupation: partial disabled   Occupation: host  Tobacco Use   Smoking status: Never   Smokeless tobacco: Never  Vaping Use   Vaping Use: Never used  Substance and Sexual Activity   Alcohol use: No   Drug use: No   Sexual activity: Not on file  Other Topics Concern   Not on file  Social History Narrative   Not on file   Social Determinants of Health   Financial Resource Strain: Not on file  Food Insecurity: No Food Insecurity (01/11/2023)   Hunger Vital Sign    Worried About Running Out of Food in the Last Year: Never true    Ran Out of Food in the Last Year: Never true  Transportation Needs: No Transportation Needs (01/11/2023)   PRAPARE - Hydrologist (Medical): No    Lack of Transportation (Non-Medical): No  Physical Activity: Not on file  Stress: Not on file  Social Connections: Not on file  Intimate Partner Violence: Not At Risk (01/11/2023)   Humiliation, Afraid, Rape, and Kick questionnaire    Fear of Current or Ex-Partner: No    Emotionally Abused: No    Physically Abused: No    Sexually Abused: No     Review of Systems    General:  +++ malaise, fevers, no night sweats or weight changes.  Cardiovascular:  No chest pain, dyspnea on exertion, edema, orthopnea, palpitations, paroxysmal nocturnal dyspnea. Dermatological: No rash, lesions/masses Respiratory: No cough, dyspnea Urologic: No hematuria, dysuria Abdominal:   No nausea, vomiting, diarrhea, bright red blood per rectum, melena, or hematemesis Neurologic:  No visual changes, wkns, changes in mental status. All other systems reviewed and are otherwise negative except as noted above.  Physical Exam    Blood pressure 104/65, pulse (!) 118, temperature 98.4 F (36.9 C), temperature source Oral, resp. rate 20, height '6\' 1"'$  (1.854 m), weight 94.5 kg, SpO2 99 %.  General: Pleasant, NAD Psych: Normal affect. Neuro: Alert and oriented X 3. Moves all extremities  spontaneously. HEENT: Normal  Neck: Supple without bruits or JVD. Lungs:  Resp regular and unlabored, CTA. Heart: RRR, tachy, no s3, s4, or murmurs. Abdomen: Soft, non-tender, non-distended, BS + x 4.  Extremities: No clubbing, cyanosis or edema. L lower leg wrapped. No drainage.  DP/PT2+, Radials 2+ and equal bilaterally.  Labs     Lab  Results  Component Value Date   WBC 14.1 (H) 01/13/2023   HGB 11.2 (L) 01/13/2023   HCT 37.9 (L) 01/13/2023   MCV 98.2 01/13/2023   PLT 255 01/13/2023    Recent Labs  Lab 01/11/23 0517 01/12/23 1224 01/13/23 0421  NA 137   < > 133*  K 4.2   < > 3.7  CL 100   < > 95*  CO2 23   < > 26  BUN 37*   < > 32*  CREATININE 10.06*   < > 9.75*  CALCIUM 8.2*   < > 7.9*  PROT 7.0  --   --   BILITOT 0.8  --   --   ALKPHOS 151*  --   --   ALT 16  --   --   AST 24  --   --   GLUCOSE 83   < > 91   < > = values in this interval not displayed.   Lab Results  Component Value Date   CHOL 110 04/01/2021   HDL 37 (L) 04/01/2021   LDLCALC 50 04/01/2021   TRIG 114 04/01/2021      Radiology Studies    US Venous Img Lower Unilateral Left (DVT)  Result Date: 01/11/2023 CLINICAL DATA:  57 year old male with pain EXAM: LEFT LOWER EXTREMITY VENOUS DOPPLER ULTRASOUND TECHNIQUE: Gray-scale sonography with graded compression, as well as color Doppler and duplex ultrasound were performed to evaluate the lower extremity deep venous systems from the level of the common femoral vein and including the common femoral, femoral, profunda femoral, popliteal and calf veins including the posterior tibial, peroneal and gastrocnemius veins when visible. The superficial great saphenous vein was also interrogated. Spectral Doppler was utilized to evaluate flow at rest and with distal augmentation maneuvers in the common femoral, femoral and popliteal veins. COMPARISON:  None Available. FINDINGS: Contralateral Common Femoral Vein: Respiratory phasicity is normal and symmetric with  the symptomatic side. No evidence of thrombus. Normal compressibility. Common Femoral Vein: No evidence of thrombus. Normal compressibility, respiratory phasicity and response to augmentation. Saphenofemoral Junction: No evidence of thrombus. Normal compressibility and flow on color Doppler imaging. Profunda Femoral Vein: No evidence of thrombus. Normal compressibility and flow on color Doppler imaging. Femoral Vein: No evidence of thrombus. Normal compressibility, respiratory phasicity and response to augmentation. Popliteal Vein: No evidence of thrombus. Normal compressibility, respiratory phasicity and response to augmentation. Calf Veins: No evidence of thrombus. Normal compressibility and flow on color Doppler imaging. Superficial Great Saphenous Vein: No evidence of thrombus. Normal compressibility and flow on color Doppler imaging. Other Findings:  None. IMPRESSION: Directed duplex of the left lower extremity negative for DVT Signed, Dulcy Fanny. Nadene Rubins, RPVI [facility] Electronically Signed   By: Corrie Mckusick D.O.   On: 01/11/2023 09:39   DG Chest 2 View  Result Date: 01/10/2023 CLINICAL DATA:  Sepsis, calf pain EXAM: CHEST - 2 VIEW COMPARISON:  09/04/2022 FINDINGS: Chronic pleural thickening and confluent linear densities at the right lung base extending up into the right mid lung, not substantially changed from 09/04/2022, presumably primarily from scarring. Plate and screw fixator of the right humerus. Speckled metal densities/metal fragments along the right anterior chest, as before. Low lung volumes are present, causing crowding of the pulmonary vasculature. Upper normal heart size. The left lung appears clear. Exaggerated upward convex lobulation of both hemidiaphragms. IMPRESSION: 1. Chronic scarring at the right lung base extending up into the right mid lung,  not substantially changed from 09/04/2022. 2. Low lung volumes. 3. Upper  normal heart size. 4. Exaggerated upward convex lobulation of both hemidiaphragms. Electronically Signed   By: Van Clines M.D.   On: 01/10/2023 19:02   DG Tibia/Fibula Left  Result Date: 01/10/2023 CLINICAL DATA:  Calf pain, suspected sepsis. EXAM: LEFT TIBIA AND FIBULA - 2 VIEW COMPARISON:  None Available. FINDINGS: There is no evidence of fracture or other focal bone lesions. Extensive peripheral vascular calcifications are present. The soft tissues are otherwise within normal limits. IMPRESSION: Negative. Electronically Signed   By: Ronney Asters M.D.   On: 01/10/2023 19:00    ECG & Cardiac Imaging    2/26 - Afib, 133, no acute ST/T changes  2/28 SVT 147, no acute ST/T changes- personally reviewed. 2/29 SVT 156, no acute ST/T changes- personally reviewed.  Assessment & Plan    1.  PSVT: Patient with a history of paroxysmal atrial fibrillation admitted with SIRS in the setting of left lower extremity ulceration and fevers, being treated with antibiotics.  During admission, he has had sinus tachycardia, paroxysmal atrial fibrillation, and more recently, runs of PSVT into the 150s, which have been symptomatic with tachypalpitations and soft blood pressures.  He notes prior history of tachycardia in the setting of acute illness, though he does not have documented history of PSVT.  He is currently on IV amiodarone maintaining sinus rhythm/tachycardia.  Agree with ongoing amiodarone therapy in the setting of soft blood pressure/relative hypotension limiting ability to titrate outpatient beta-blocker dose.  If SVT remains quiescent throughout the day, can likely transition to p.o. amiodarone tomorrow and he can follow-up with his outpatient cardiologist in [place].  2.  Paroxysmal atrial fibrillation: Patient with a long history of paroxysmal atrial fibrillation managed with low-dose metoprolol therapy in the outpatient setting.  He was previously anticoagulated with warfarin and  subsequently Eliquis however, this was discontinued in May 2022 in the setting of GI bleed and he is status post watchman (08/2021).  See #1 regarding amiodarone.  Would look to transition him back to low-dose metoprolol and off of diltiazem as pressure stabilized.  I do note that he is only received a few doses of diltiazem with multiple doses being held due to hypotension.  3.  Left lower extremity ulceration/SIRS: Antibiotics per internal medicine.  White count remains mildly elevated at 14.1.  4.  Coronary artery disease: Status post LAD stenting in 2022.  He has not been having any chest pain and remains on low-dose aspirin and statin therapy.  As pressure stabilized, look to resume home dose metoprolol and he still diltiazem, which has been held frequently thus far.  5.  Hyperlipidemia: Continue statin therapy.  LDL of 50 in May 2022-followed by his outpatient cardiologist.  6.  Hypotension: Uses midodrine on dialysis days at home but otherwise is able to tolerate low-dose metoprolol in the outpatient setting.  Pressure soft throughout this admission in the setting of SIRS and tachycardia.  Currently on midodrine 15 mg 3 times daily.  7.  End-stage renal disease: Status post remote transplant with ongoing outpatient workup for repeat transplant.  Per nephrology.  8.  Left lower extremity DVT: Diagnosed in October 2023.  Status post 3 months of Eliquis.  Lower extremity ultrasound this admission without DVT.  Risk Assessment/Risk Scores:          CHA2DS2-VASc Score = 3   This indicates a 3.2% annual risk of stroke. The patient's score is based upon: CHF History:  1 HTN History: 1 Diabetes History: 0 Stroke History: 0 Vascular Disease History: 1 Age Score: 0 Gender Score: 0     Signed, Murray Hodgkins, NP 01/13/2023, 8:58 AM  For questions or updates, please contact   Please consult [email or web address] for contact info under Cardiology/STEMI.    Attending note Patient seen and  disucssed with NP Sharolyn Douglas, I agree with his documentation above. 57 yo male history of ESRD, CAD, chronic hypotension, HFpEF, PAF admitted with left leg ulcer and SIRS, being managed by primary team. During admisison has had issues with recurrent arrhythmias   Admit labs K 4.3 Cr 8.99 BUN 33 Lactic acid 2.2 WBC 11.6 Hgb 13.4 Plt 275 Pro calc 6.62 CXR no acute process EKG afib 133   1.PSVT - episodes of PSVT in setting of SIRS, systemic illness - chronic hypotension on home midodrine  limits av nodal agent dosing - started on IV amiodarone. Likely can plan for short course as SVT likely to improve as systemic illness resolves  2. PAF - history of watchman device, prior issues with GI bleeding - starting IV amio for SVT, likely as outpatient can settle back into his prior regimen of just low dose lopressor  3.ESRD - per neprhology  4. Chronic hypotension - on midodrine at home.   Carlyle Dolly MD

## 2023-01-13 NOTE — TOC Initial Note (Signed)
Transition of Care [facility]) - Initial/Assessment Note    Patient Details  Name: [name] MRN: [id number] Date of Birth: [birth year]  Transition of Care [facility]) CM/SW Contact:    Boneta Lucks, RN Phone Number: 01/13/2023, 1:29 PM  Clinical Narrative:     Patient admitted with Sepsis. Patient has PCP, Insurance, lives at home with his wife. PT has no follow up. TOC following. DC plan for tomorrow.               Expected Discharge Plan: Home/Self Care Barriers to Discharge: Continued Medical Work up   Patient Goals and CMS Choice    Expected Discharge Plan and [place] arrangements for the past 2 months: [place]                   Prior Living Arrangements/Services Living arrangements for the past 2 months: [place] Lives with:: Spouse           Activities of Daily Living Home Assistive Devices/Equipment: CPAP, Other (Comment) (boot) ADL Screening (condition at time of admission) Patient's cognitive ability adequate to safely complete daily activities?: Yes Is the patient deaf or have difficulty hearing?: No Does the patient have difficulty seeing, even when wearing glasses/contacts?: No Does the patient have difficulty concentrating, remembering, or making decisions?: No Patient able to express need for assistance with ADLs?: Yes Does the patient have difficulty dressing or bathing?: No Independently performs ADLs?: Yes (appropriate for developmental age) Does the patient have difficulty walking or climbing stairs?: Yes Weakness of Legs: None Weakness of Arms/Hands: None  Permission Sought/Granted  Emotional Assessment       Orientation: : Oriented to Self, Oriented to Place, Oriented to  Time, Oriented to Situation Alcohol / Substance Use: Not Applicable Psych Involvement: No (comment)  Admission diagnosis:  Bacteremia [R78.81] Sepsis ([place]) [A41.9] Patient Active Problem List   Diagnosis Date Noted   Sepsis ([place])  01/10/2023   History of DVT (deep vein thrombosis) 01/10/2023   Chronic diastolic CHF (congestive heart failure) ([place]) 08/28/2022   Hyperkalemia 08/27/2022   Acute deep vein thrombosis (DVT) of distal vein of left lower extremity ([place]) 08/27/2022   Hypotension 08/27/2022   Mixed hyperlipidemia 08/27/2022   OSA on CPAP 08/27/2022   Vitamin D deficiency 08/27/2022   BPH (benign prostatic hyperplasia) 08/27/2022   GI bleed 08/27/2021   Transfusion-dependent anemia    Gastrointestinal hemorrhage with melena    Hypovolemic shock ([place])    AVM (arteriovenous malformation) of duodenum, acquired with hemorrhage    S/P coronary artery stent placement    Acute upper GI bleed 03/30/2021   ESRD on dialysis [facility])    Colon cancer screening 09/03/2020   GERD (gastroesophageal reflux disease) 09/03/2020   Hematoma of left lower extremity    Swelling of calf    Atrial fibrillation with RVR ([place]) 11/15/2016   Renal insufficiency 11/15/2016   Severe anemia 11/15/2016   Atrial fibrillation with rapid ventricular response ([place]) 11/15/2016   Cellulitis 11/15/2016   Fever    PCP:  Gardiner Rhyme, MD Pharmacy:   [facility] [phone] [facility] - [place], [place] [address] [place] [address] Phone: [phone] Fax: [phone]  [place], [place] [address] Phone: [phone] Fax: [phone]  [id number] [place], [place] [id number] [place] [id number] Phone: [phone] Fax: [phone]     Social Determinants of Health (SDOH) Social History: SDOH Screenings   Food Insecurity: No Food Insecurity (01/11/2023)  Housing: Low Risk  (01/11/2023)  Transportation Needs: No Transportation Needs (01/11/2023)  Utilities: Not At Risk (01/11/2023)  Tobacco Use: Low Risk  (01/13/2023)   SDOH  Interventions:     Readmission Risk Interventions     No data to display

## 2023-01-13 NOTE — Progress Notes (Signed)
PROGRESS NOTE    Patient: [name]                            PCP: Gardiner Rhyme, MD                    DOB: 02-16-1966            DOA: 01/10/2023 [id number]             DOS: 01/13/2023, 11:59 AM   LOS: 3 days   Date of Service: The patient was seen and examined on 01/13/2023  Subjective:   The patient was seen and examined this morning, in A-fib, denies any chest pain or shortness of breath blood pressure has improved  Tolerated hemodialysis yesterday Throughout the day patient was having SVTs, A-fib .Marland Kitchen... Was started on IV amiodarone  Brief Narrative:   [name] is a 57 y.o. male with medical history significant of atrial fibrillation, CHF, ESRD on dialysis Monday Wednesday Friday, DVT of left lower extremity, GERD, HLD/ HTN, admitted on 01/10/2023 for with A-fib with RVR and soft BP and concerns about sepsis      Assessment & Plan:   Principal Problem:   Sepsis ([place]) Active Problems:   ESRD on dialysis ([place])   GERD (gastroesophageal reflux disease)   Mixed hyperlipidemia   Atrial fibrillation with rapid ventricular response (HCC)   Hypotension   History of DVT (deep vein thrombosis)   1)Sepsis -SIRS--POA Met SIRS criteria on admission Vitals this morning: Blood pressure 92/70, pulse (!) 143, temperature 98.1 F (36.7 C),RR: (!) 35,  SpO2 (!) 85 %.  -Ruling out sepsis/bacteremia  -- Negative COVID, flu, RSV - Blood cultures pending -Procalcitonin 6.6 >> 5.42 - WBC 11.6 >>> 11.3   - Lactic acid was 2.2 >> 2.9, 1.2   -Patient initially received cefepime Flagyl and vancomycin --Continue cefepime, Zyvox - Patient received IV fluid boluses--patient does not make urine in the setting of ESRD so he is  high risk for volume overload - Chest x-ray shows no infectious findings - Patient has a well-healing wound on the left lower extremity that does not look infected  -Midodrine orally -the patient was on midodrine on hemodialysis days, changed to 3 times  daily every day now given soft BP  -  will consider Levophed if needed for pressure support - Cortisol levels 11.9  01/12/2023: Stable this am:     2)ESRD on dialysis [facility]) - Dialysis Monday - Wednesday - Friday - Did Not miss any hemodialysis - -Plans for hemodialysis on 01/12/2023    3)Atrial fibrillation with rapid ventricular response (HCC) -A-fib with RVR-currently on Amiodarone drip -Cardiology following closely -appreciate close follow-up and the recommendation -- Patient has Watchman device - Patient does take metoprolol on non-HD days  -Due to hypotension but holding metoprolol and diltiazem -Not a candidate for chronic anticoagulation, discontinued May 2022 due to GI bleed   GERD (gastroesophageal reflux disease) - Continue Pepcid   Mixed hyperlipidemia - Continue statin   History of DVT (deep vein thrombosis) - History of DVT in the left lower extremity - Finish 90 days of Eliquis - Complains of left leg swelling, repeat venous Dopplers on 01/11/2023 without acute DVT   Hypotension -Midodrine orally -the patient was on midodrine on hemodialysis days, changed to 3 times daily every day now given soft BP -  consider Levophed if needed for pressure support -Check a.m. cortisol levels  Status is: Inpatient      ------------------------------------------------------------------------------------------------------------------------- Nutritional status:  The patient's BMI is: Body mass index is 27.49 kg/m. I agree with the assessment and plan as outlined below: Nutrition Status:  Skin Assessment: I have examined the patient's skin and I agree with the wound assessment as performed by wound care team As outlined belowe:  -------------------------------------------------------------------------------------------------------------------- Cultures; Blood Cultures x 2 >> NGT Urine Culture  >>> NGT  Sputum Culture >> NGT     ------------------------------------------------------------------------------------------------------------------------------------------------  DVT prophylaxis:  heparin injection 5,000 Units Start: 01/11/23 0600 SCDs Start: 01/11/23 [id number]   Code Status:   Code Status: Full Code  Family Communication: No family member present at bedside- attempt will be made to update daily The above findings and plan of care has been discussed with patient (and family)  in detail,  they expressed understanding and agreement of above. -Advance care planning has been discussed.   Admission status:   Status is: Inpatient Remains inpatient appropriate because: Needing further evaluation and treatment for IV antibiotics meeting sepsis/SIRS criteria, hemodialysis, rate control, hypotensive   Disposition: From  - home             Planning for discharge in 1-2 days: to   Procedures:   No admission procedures for hospital encounter.   Antimicrobials:  Anti-infectives (From admission, onward)    Start     Dose/Rate Route Frequency Ordered Stop   01/11/23 2200  ceFEPIme (MAXIPIME) 1 g in sodium chloride 0.9 % 100 mL IVPB        1 g 200 mL/hr over 30 Minutes Intravenous Every 24 hours 01/11/23 0134     01/10/23 2330  linezolid (ZYVOX) IVPB 600 mg        600 mg 300 mL/hr over 60 Minutes Intravenous Every 12 hours 01/10/23 2320     01/10/23 2000  ceFEPIme (MAXIPIME) 2 g in sodium chloride 0.9 % 100 mL IVPB  Status:  Discontinued        2 g 200 mL/hr over 30 Minutes Intravenous  Once 01/10/23 1955 01/10/23 1959   01/10/23 2000  metroNIDAZOLE (FLAGYL) IVPB 500 mg        500 mg 100 mL/hr over 60 Minutes Intravenous  Once 01/10/23 1955 01/10/23 2125   01/10/23 2000  ceFEPIme (MAXIPIME) 1 g in sodium chloride 0.9 % 100 mL IVPB        1 g 200 mL/hr over 30 Minutes Intravenous  Once 01/10/23 1959 01/10/23 2241        Medication:   amiodarone  200 mg Oral BID   aspirin EC  81 mg Oral q AM    atorvastatin  80 mg Oral QHS   calcium carbonate  1,500 mg Oral QHS   Chlorhexidine Gluconate Cloth  6 each Topical Q0600   Chlorhexidine Gluconate Cloth  6 each Topical Q0600   famotidine  20 mg Oral QHS   heparin  5,000 Units Subcutaneous Q8H   midodrine  15 mg Oral TID WC   tamsulosin  0.4 mg Oral QPM    acetaminophen **OR** acetaminophen, ondansetron **OR** ondansetron (ZOFRAN) IV, mouth rinse, oxyCODONE   Objective:   Vitals:   01/13/23 0857 01/13/23 0900 01/13/23 1100 01/13/23 1125  BP:   103/64   Pulse:      Resp: (!) 22 (!) 27 16   Temp:    98.6 F (37 C)  TempSrc:    Oral  SpO2:      Weight:      Height:  Intake/Output Summary (Last 24 hours) at 01/13/2023 1159 Last data filed at 01/13/2023 0823 Gross per 24 hour  Intake 1259.77 ml  Output 2000 ml  Net -740.23 ml   Filed Weights   01/11/23 1457 01/12/23 1443 01/12/23 1823  Weight: 96.5 kg 96.5 kg 94.5 kg     Physical examination:   General:  AAO x 3,  cooperative, no distress;   HEENT:  Normocephalic, PERRL, otherwise with in Normal limits   Neuro:  CNII-XII intact. , normal motor and sensation, reflexes intact   Lungs:   Clear to auscultation BL, Respirations unlabored,  No wheezes / crackles  Cardio:    Irregularly irregular- no murmure, No Rubs or Gallops   Abdomen:  Soft, non-tender, bowel sounds active all four quadrants, no guarding or peritoneal signs.  Muscular  skeletal:  Limited exam -global generalized weaknesses - in bed, able to move all 4 extremities,   2+ pulses,  symmetric, No pitting edema  Skin:  Dry, warm to touch, negative for any Rashes, left lower extremity superficial wound negative for any eschar or discharge, mild streaking erythema dressing in place  Wounds: Please see nursing documentation          --------------------------------------------------------------------------------------------------------------------    LABs:     Latest Ref Rng & Units 01/13/2023     4:21 AM 01/12/2023   12:24 PM 01/11/2023    5:17 AM  CBC  WBC 4.0 - 10.5 K/uL 14.1  12.4  11.3   Hemoglobin 13.0 - 17.0 g/dL 11.2  11.6  12.8   Hematocrit 39.0 - 52.0 % 37.9  39.8  44.2   Platelets 150 - 400 K/uL 255  227  235       Latest Ref Rng & Units 01/13/2023    4:21 AM 01/12/2023   12:24 PM 01/11/2023    5:17 AM  CMP  Glucose 70 - 99 mg/dL 91  124  83   BUN 6 - 20 mg/dL 32  49  37   Creatinine 0.61 - 1.24 mg/dL 9.75  12.71  10.06   Sodium 135 - 145 mmol/L 133  136  137   Potassium 3.5 - 5.1 mmol/L 3.7  4.3  4.2   Chloride 98 - 111 mmol/L 95  97  100   CO2 22 - 32 mmol/L '26  24  23   '$ Calcium 8.9 - 10.3 mg/dL 7.9  8.1  8.2   Total Protein 6.5 - 8.1 g/dL   7.0   Total Bilirubin 0.3 - 1.2 mg/dL   0.8   Alkaline Phos 38 - 126 U/L   151   AST 15 - 41 U/L   24   ALT 0 - 44 U/L   16        Micro Results Recent Results (from the past 240 hour(s))  Culture, blood (Routine X 2) w Reflex to ID Panel     Status: None (Preliminary result)   Collection Time: 01/10/23  7:00 PM   Specimen: BLOOD RIGHT ARM  Result Value Ref Range Status   Specimen Description BLOOD RIGHT ARM  Final   Special Requests   Final    BOTTLES DRAWN AEROBIC AND ANAEROBIC Blood Culture adequate volume   Culture   Final    NO GROWTH 3 DAYS Performed at [facility], [address] [place], [place] [address]    Report Status PENDING  Incomplete  Culture, blood (Routine x 2)     Status: None (Preliminary result)   Collection Time:  01/10/23  7:35 PM   Specimen: BLOOD  Result Value Ref Range Status   Specimen Description BLOOD BLOOD RIGHT WRIST  Final   Special Requests   Final    BOTTLES DRAWN AEROBIC AND ANAEROBIC Blood Culture adequate volume   Culture   Final    NO GROWTH 3 DAYS Performed at [facility], [address], [place], [place] [address]    Report Status PENDING  Incomplete  Resp panel by RT-PCR (RSV, Flu A&B, Covid) Anterior Nasal Swab     Status: None   Collection Time: 01/10/23   8:18 PM   Specimen: Anterior Nasal Swab  Result Value Ref Range Status   SARS Coronavirus 2 by RT PCR NEGATIVE NEGATIVE Final    Comment: (NOTE) SARS-CoV-2 target nucleic acids are NOT DETECTED.  The SARS-CoV-2 RNA is generally detectable in upper respiratory specimens during the acute phase of infection. The lowest concentration of SARS-CoV-2 viral copies this assay can detect is 138 copies/mL. A negative result does not preclude SARS-Cov-2 infection and should not be used as the sole basis for treatment or other patient management decisions. A negative result may occur with  improper specimen collection/handling, submission of specimen other than nasopharyngeal swab, presence of viral mutation(s) within the areas targeted by this assay, and inadequate number of viral copies(<138 copies/mL). A negative result must be combined with clinical observations, patient history, and epidemiological information. The expected result is Negative.  Fact Sheet for Patients:  EntrepreneurPulse.com.au  Fact Sheet for Healthcare Providers:  IncredibleEmployment.be  This test is no t yet approved or cleared by the [place] FDA and  has been authorized for detection and/or diagnosis of SARS-CoV-2 by FDA under an Emergency Use Authorization (EUA). This EUA will remain  in effect (meaning this test can be used) for the duration of the COVID-19 declaration under Section 564(b)(1) of the Act, 21 U.S.C.section 360bbb-3(b)(1), unless the authorization is terminated  or revoked sooner.       Influenza A by PCR NEGATIVE NEGATIVE Final   Influenza B by PCR NEGATIVE NEGATIVE Final    Comment: (NOTE) The Xpert Xpress SARS-CoV-2/FLU/RSV plus assay is intended as an aid in the diagnosis of influenza from Nasopharyngeal swab specimens and should not be used as a sole basis for treatment. Nasal washings and aspirates are unacceptable for Xpert Xpress  SARS-CoV-2/FLU/RSV testing.  Fact Sheet for Patients: EntrepreneurPulse.com.au  Fact Sheet for Healthcare Providers: IncredibleEmployment.be  This test is not yet approved or cleared by the [place] FDA and has been authorized for detection and/or diagnosis of SARS-CoV-2 by FDA under an Emergency Use Authorization (EUA). This EUA will remain in effect (meaning this test can be used) for the duration of the COVID-19 declaration under Section 564(b)(1) of the Act, 21 U.S.C. section 360bbb-3(b)(1), unless the authorization is terminated or revoked.     Resp Syncytial Virus by PCR NEGATIVE NEGATIVE Final    Comment: (NOTE) Fact Sheet for Patients: EntrepreneurPulse.com.au  Fact Sheet for Healthcare Providers: IncredibleEmployment.be  This test is not yet approved or cleared by the [place] FDA and has been authorized for detection and/or diagnosis of SARS-CoV-2 by FDA under an Emergency Use Authorization (EUA). This EUA will remain in effect (meaning this test can be used) for the duration of the COVID-19 declaration under Section 564(b)(1) of the Act, 21 U.S.C. section 360bbb-3(b)(1), unless the authorization is terminated or revoked.  Performed at [facility], [address], [place], [place] [address]   MRSA Next Gen by PCR,  Nasal     Status: None   Collection Time: 01/11/23  2:27 PM   Specimen: Nasal Mucosa; Nasal Swab  Result Value Ref Range Status   MRSA by PCR Next Gen NOT DETECTED NOT DETECTED Final    Comment: (NOTE) The GeneXpert MRSA Assay (FDA approved for NASAL specimens only), is one component of a comprehensive MRSA colonization surveillance program. It is not intended to diagnose MRSA infection nor to guide or monitor treatment for MRSA infections. Test performance is not FDA approved in patients less than 35 years old. Performed at [facility], [address], [place],  [place] [address]     Radiology Reports No results found.  SIGNED: Deatra James, MD, FHM. FAAFP. Zacarias Pontes - [facility] Time spent > 35 min.  In seeing, evaluating and examining the patient. Reviewing medical records, labs, drawn plan of care. [facility],  Pager (please use [email or web address] to page/ text) Please use Epic Secure Chat for non-urgent communication (7AM-7PM)  If 7PM-7AM, please contact night-coverage [email or web address], 01/13/2023, 11:59 AM

## 2023-01-13 NOTE — Progress Notes (Signed)
Patient already on home CPAP unit for the night. He uses machine independently.

## 2023-01-13 NOTE — Progress Notes (Signed)
Subjective:  HD yest-  removed 2000, post weight 94.5-  low BP noted-  having some variable HR-  cards saw-  now on amio-  absolutely no c/o's   Objective Vital signs in last 24 hours: Vitals:   01/13/23 0700 01/13/23 0800 01/13/23 0857 01/13/23 0900  BP: (!) 114/39 104/65    Pulse:      Resp: 17 20 (!) 22 (!) 27  Temp:      TempSrc:      SpO2:      Weight:      Height:       Weight change: 0 kg  Intake/Output Summary (Last 24 hours) at 01/13/2023 0951 Last data filed at 01/13/2023 [id number] Gross per 24 hour  Intake 1459.77 ml  Output 2001 ml  Net -541.23 ml   HD orders at [facility]  MWF 3 hours 45 min using AVF- 15 gauge needles 400 BFR/500 DFR- 2/2.5 bath,  temp 36.5-  gets heparin 1000 load, then 200 per hour, also on venofer 50 per week  EDW 94.5   Assessment/ Plan: Pt is a 57 y.o. yo male with ESRD who was admitted on 01/10/2023 with fever, afib and hypotension  Assessment/Plan: 1. Fever-  suspicious for sepsis-  placed on broad spectrum abx-  now only cefepime-  cultures negative so far-  fever resolved 2. ESRD - normally MWF via AVF -  planning for routine HD tomorrow again  3. Anemia-  does not appear to be an issue-  on iron and no ESA as OP 4. Secondary hyperparathyroidism-  not on active vit D as OP-  seems to be on calcium supp-  no binder listed on meds, he confirms no binder-  will check phos 5. HTN/volume- in afib and hypotensive-  his reg dose of midodrine has been increased to hopefully avoid pressors here.  fluid removal as tolerated with HD  -   he says he takes as much as 35 mg of midodrine on dialysis days-  pt seems knowledgeable about what he can handle volume removal wise and what he cant  [name]    Labs: Basic Metabolic Panel: Recent Labs  Lab 01/11/23 0517 01/12/23 1224 01/13/23 0421  NA 137 136 133*  K 4.2 4.3 3.7  CL 100 97* 95*  CO2 '23 24 26  '$ GLUCOSE 83 124* 91  BUN 37* 49* 32*  CREATININE 10.06* 12.71* 9.75*  CALCIUM  8.2* 8.1* 7.9*   Liver Function Tests: Recent Labs  Lab 01/10/23 1935 01/11/23 0517  AST 35 24  ALT 23 16  ALKPHOS 182* 151*  BILITOT 0.7 0.8  PROT 8.2* 7.0  ALBUMIN 3.3* 2.8*   No results for input(s): "LIPASE", "AMYLASE" in the last 168 hours. No results for input(s): "AMMONIA" in the last 168 hours. CBC: Recent Labs  Lab 01/10/23 1935 01/11/23 0517 01/12/23 1224 01/13/23 0421  WBC 11.6* 11.3* 12.4* 14.1*  NEUTROABS 7.3 7.2  --   --   HGB 13.4 12.8* 11.6* 11.2*  HCT 45.3 44.2 39.8 37.9*  MCV 97.4 100.9* 99.3 98.2  PLT 275 235 227 255   Cardiac Enzymes: No results for input(s): "CKTOTAL", "CKMB", "CKMBINDEX", "TROPONINI" in the last 168 hours. CBG: Recent Labs  Lab 01/11/23 2109  GLUCAP 96    Iron Studies: No results for input(s): "IRON", "TIBC", "TRANSFERRIN", "FERRITIN" in the last 72 hours. Studies/Results: No results found. Medications: Infusions:  amiodarone 60 mg/hr (01/13/23 0817)   Followed by   amiodarone  ceFEPime (MAXIPIME) IV Stopped (01/12/23 2214)   linezolid (ZYVOX) IV 600 mg (01/13/23 0815)    Scheduled Medications:  amiodarone  200 mg Oral BID   aspirin EC  81 mg Oral q AM   atorvastatin  80 mg Oral QHS   calcium carbonate  1,500 mg Oral QHS   Chlorhexidine Gluconate Cloth  6 each Topical Q0600   diltiazem  30 mg Oral TID   famotidine  20 mg Oral QHS   heparin  5,000 Units Subcutaneous Q8H   midodrine  15 mg Oral TID WC   tamsulosin  0.4 mg Oral QPM    have reviewed scheduled and prn medications.  Physical Exam: General:  NAD Heart:irreg/tachy Lungs: mostly clear Abdomen: soft, non tender Extremities: no peripheral edema Dialysis Access: left forearm AVF-  no signs of infection    01/13/2023,9:51 AM  LOS: 3 days

## 2023-01-13 NOTE — Evaluation (Signed)
Physical Therapy Evaluation Patient Details Name: [name] MRN: [id number] DOB: 1965/11/25 Today's Date: 01/13/2023  History of Present Illness  [name] is a 57 y.o. male with medical history significant of atrial fibrillation, CHF, ESRD on dialysis Monday Wednesday Friday, DVT of left lower extremity, GERD, hyperlipidemia, hypertension, and more presents the ED with a chief complaint of fever.  Patient reports that he had a fever that started 3 days ago.  It seemed to be resolving 2 days ago, and he felt good yesterday.  Fever returned again today.  He reports a Tmax at home of 102.4.  Patient arrived with a temperature of 102.  Patient reports that his only symptom out of the norm for him is been left lower extremity soreness.  He was worried he had a DVT there because it feels the same as when he did have a DVT in October.  Patient reports that he finished 90 days of Eliquis back in October, but has not continued to be on anticoagulation.  For A-fib, patient has Watchman device.  Patient describes this pain is a soreness in the back of his calf.  He does have a ulcer lower on that extremity.  He has been treating that per wound care and is due for his last skin graft coming up.  Patient reports that it has not been draining anything.  He reports the wound is as clean as can be.  Patient denies any cough, nausea, vomiting, diarrhea.  Patient does not make urine.  He reports that his stomach has been "gurgly" lately, and that his wife thought he had a GI virus.  He reports that everybody he knew with a GI virus was having nausea and vomiting, and he was not having any of the symptoms.  He has had a decreased appetite.  His last normal meal was about 4 days ago.  He is only snack since then.  Patient did go to dialysis today.  He has no other complaints at this time.   Clinical Impression  Patient functioning at baseline for functional mobility and gait demonstrating good return for bed  mobility, transfers, and ambulation in room/hallways without loss of balance.  Plan:  Patient discharged from physical therapy to care of nursing for ambulation daily as tolerated for length of stay.         Recommendations for follow up therapy are one component of a multi-disciplinary discharge planning process, led by the attending physician.  Recommendations may be updated based on patient status, additional functional criteria and insurance authorization.  Follow Up Recommendations No PT follow up      Assistance Recommended at Discharge PRN  Patient can return home with the following  Help with stairs or ramp for entrance;Assistance with cooking/housework    Equipment Recommendations None recommended by PT  Recommendations for Other Services       Functional Status Assessment Patient has not had a recent decline in their functional status     Precautions / Restrictions Precautions Precautions: None Required Braces or Orthoses: Splint/Cast Restrictions Weight Bearing Restrictions: No Other Position/Activity Restrictions: walking boot for LLE      Mobility  Bed Mobility Overal bed mobility: Independent                  Transfers Overall transfer level: Modified independent                      Ambulation/Gait Ambulation/Gait assistance: Modified independent (Device/Increase time) Gait Distance (Feet):  100 Feet Assistive device: None Gait Pattern/deviations: Decreased step length - right, Decreased step length - left, Decreased stride length, Antalgic, Decreased dorsiflexion - left Gait velocity: decreased     General Gait Details: good return for ambulating in room/hallways without loss of balance wearing walking boot LLE and shoe on RLE having to hike left hip when taking steps due to limited ankle dorsiflexion  Stairs            Wheelchair Mobility    Modified Rankin (Stroke Patients Only)       Balance Overall balance assessment:  Needs assistance Sitting-balance support: Feet supported, No upper extremity supported Sitting balance-Leahy Scale: Good Sitting balance - Comments: seated at EOB   Standing balance support: During functional activity, No upper extremity supported Standing balance-Leahy Scale: Fair Standing balance comment: fair/good without use of an AD                             Pertinent Vitals/Pain Pain Assessment Pain Assessment: 0-10 Pain Score: 6  Pain Location: left calf Pain Descriptors / Indicators: Sore, Discomfort Pain Intervention(s): Limited activity within patient's tolerance, Monitored during session, Repositioned    Home Living Family/patient expects to be discharged to:: Private residence Living Arrangements: Spouse/significant other;Children Available Help at Discharge: Family;Available PRN/intermittently Type of Home: House Home Access: Stairs to enter   Entrance Stairs-Number of Steps: 1 Alternate Level Stairs-Number of Steps: 12 steps to basement Home Layout: Two level;Laundry or work area in [place]: Conservation officer, nature (2 wheels);Cane - single point;Shower seat      Prior Function Prior Level of Function : Independent/Modified Independent;Driving             Mobility Comments: Hydrographic surveyor, drives, works ADLs Comments: Independent     Hand Dominance        Extremity/Trunk Assessment   Upper Extremity Assessment Upper Extremity Assessment: Overall WFL for tasks assessed    Lower Extremity Assessment Lower Extremity Assessment: Overall WFL for tasks assessed    Cervical / Trunk Assessment Cervical / Trunk Assessment: Normal  Communication   Communication: No difficulties  Cognition Arousal/Alertness: Awake/alert Behavior During Therapy: WFL for tasks assessed/performed Overall Cognitive Status: Within Functional Limits for tasks assessed                                          General Comments       Exercises     Assessment/Plan    PT Assessment Patient does not need any further PT services  PT Problem List         PT Treatment Interventions      PT Goals (Current goals can be found in the Care Plan section)  Acute Rehab PT Goals Patient Stated Goal: return home with family to assist PT Goal Formulation: With patient Time For Goal Achievement: 01/13/23 Potential to Achieve Goals: Good    Frequency       Co-evaluation               AM-PAC PT "6 Clicks" Mobility  Outcome Measure Help needed turning from your back to your side while in a flat bed without using bedrails?: None Help needed moving from lying on your back to sitting on the side of a flat bed without using bedrails?: None Help needed moving to and from a bed to a  chair (including a wheelchair)?: None Help needed standing up from a chair using your arms (e.g., wheelchair or bedside chair)?: None Help needed to walk in hospital room?: None Help needed climbing 3-5 steps with a railing? : A Little 6 Click Score: 23    End of Session   Activity Tolerance: Patient tolerated treatment well Patient left: in chair;with call bell/phone within reach Nurse Communication: Mobility status PT Visit Diagnosis: Unsteadiness on feet (R26.81);Other abnormalities of gait and mobility (R26.89);Muscle weakness (generalized) (M62.81)    Time: [id number] PT Time Calculation (min) (ACUTE ONLY): 20 min   Charges:   PT Evaluation $PT Eval Moderate Complexity: 1 Mod PT Treatments $Therapeutic Activity: 8-22 mins        12:23 PM, 01/13/23 Lonell Grandchild, MPT Physical Therapist with [facility] [phone] office [phone] mobile phone

## 2023-01-13 NOTE — Progress Notes (Signed)
Pt has had increasing respirations, low grade fevers and increased pain in his left lower leg. Pt has had an increase in his WBC count, and lactic acid was elevated on admission. Attending RN asked MD for a repeat lactic draw since patient seems to be showing signs of increasing infection. Will continue to monitor and await results.

## 2023-01-13 NOTE — Progress Notes (Addendum)
Date and time results received: 01/13/23 1650 (use smartphrase ".now" to insert current time)  Test: Lactic Acid Critical Value: 3.4  Name of Provider Notified: Roger Shelter, MD.  Orders Received? Or Actions Taken?:Fluid bolus ordered and blood cultures ordered

## 2023-01-14 ENCOUNTER — Inpatient Hospital Stay (HOSPITAL_COMMUNITY): Payer: Medicare Other

## 2023-01-14 DIAGNOSIS — N186 End stage renal disease: Secondary | ICD-10-CM | POA: Diagnosis not present

## 2023-01-14 DIAGNOSIS — E782 Mixed hyperlipidemia: Secondary | ICD-10-CM | POA: Diagnosis not present

## 2023-01-14 DIAGNOSIS — K219 Gastro-esophageal reflux disease without esophagitis: Secondary | ICD-10-CM | POA: Diagnosis not present

## 2023-01-14 DIAGNOSIS — A419 Sepsis, unspecified organism: Secondary | ICD-10-CM | POA: Diagnosis not present

## 2023-01-14 LAB — BASIC METABOLIC PANEL
Anion gap: 18 — ABNORMAL HIGH (ref 5–15)
BUN: 38 mg/dL — ABNORMAL HIGH (ref 6–20)
CO2: 24 mmol/L (ref 22–32)
Calcium: 8 mg/dL — ABNORMAL LOW (ref 8.9–10.3)
Chloride: 93 mmol/L — ABNORMAL LOW (ref 98–111)
Creatinine, Ser: 11.75 mg/dL — ABNORMAL HIGH (ref 0.61–1.24)
GFR, Estimated: 5 mL/min — ABNORMAL LOW (ref >60)
Glucose, Bld: 146 mg/dL — ABNORMAL HIGH (ref 70–99)
Potassium: 4.2 mmol/L (ref 3.5–5.1)
Sodium: 135 mmol/L (ref 135–145)

## 2023-01-14 LAB — CBC
HCT: 41 % (ref 39.0–52.0)
Hemoglobin: 11.8 g/dL — ABNORMAL LOW (ref 13.0–17.0)
MCH: 28.8 pg (ref 26.0–34.0)
MCHC: 28.8 g/dL — ABNORMAL LOW (ref 30.0–36.0)
MCV: 100 fL (ref 80.0–100.0)
Platelets: 274 10*3/uL (ref 150–400)
RBC: 4.1 MIL/uL — ABNORMAL LOW (ref 4.22–5.81)
RDW: 17.5 % — ABNORMAL HIGH (ref 11.5–15.5)
WBC: 17.2 10*3/uL — ABNORMAL HIGH (ref 4.0–10.5)
nRBC: 0 % (ref 0.0–0.2)

## 2023-01-14 LAB — PROCALCITONIN: Procalcitonin: 6.64 ng/mL

## 2023-01-14 MED ORDER — SODIUM CHLORIDE 0.9 % IV BOLUS
500.0000 mL | Freq: Once | INTRAVENOUS | Status: AC
  Administered 2023-01-14: 500 mL via INTRAVENOUS

## 2023-01-14 MED ORDER — AMIODARONE LOAD VIA INFUSION
150.0000 mg | Freq: Once | INTRAVENOUS | Status: AC
  Administered 2023-01-14: 150 mg via INTRAVENOUS
  Filled 2023-01-14: qty 83.34

## 2023-01-14 MED ORDER — PANTOPRAZOLE SODIUM 40 MG PO TBEC
40.0000 mg | DELAYED_RELEASE_TABLET | Freq: Every day | ORAL | Status: DC
  Administered 2023-01-15 – 2023-01-25 (×11): 40 mg via ORAL
  Filled 2023-01-14 (×11): qty 1

## 2023-01-14 NOTE — Progress Notes (Addendum)
Subjective:  still some variable HR-  cards seeing - on amio-  absolutely no c/o's -  sitting in bedside chair -  now on levo-  taking BP in leg  Objective Vital signs in last 24 hours: Vitals:   01/14/23 0751 01/14/23 0800 01/14/23 0815 01/14/23 0830  BP:  90/66 (!) 141/70 113/69  Pulse:  (!) 141 69 (!) 136  Resp:  15 18 (!) 22  Temp: 98.4 F (36.9 C)     TempSrc: Oral     SpO2:  99% 94% 99%  Weight:      Height:       Weight change:   Intake/Output Summary (Last 24 hours) at 01/14/2023 0900 Last data filed at 01/14/2023 0843 Gross per 24 hour  Intake 2189.67 ml  Output 350 ml  Net 1839.67 ml   HD orders at [facility]  MWF 3 hours 45 min using AVF- 15 gauge needles 400 BFR/500 DFR- 2/2.5 bath,  temp 36.5-  gets heparin 1000 load, then 200 per hour, also on venofer 50 per week  EDW 94.5   Assessment/ Plan: Pt is a 57 y.o. yo male with ESRD who was admitted on 01/10/2023 with fever, afib and hypotension  Assessment/Plan: 1. Fever-  suspicious for sepsis-  placed on broad spectrum abx-  now only cefepime-  cultures negative so far-  fever resolved 2. ESRD - normally MWF via AVF -  planning for routine HD today - will basically run even given heart and BP issues 3. Anemia-  does not appear to be an issue-  on iron and no ESA as OP 4. Secondary hyperparathyroidism-  not on active vit D as OP-  seems to be on calcium supp-  no binder listed on meds, he confirms no binder-  will check phos 5. HTN/volume- in afib and hypotensive-  his reg dose of midodrine has been increased to hopefully avoid pressors here.  fluid removal as tolerated with HD  -   he says he takes as much as 35 mg of midodrine on dialysis days-  pt seems knowledgeable about what he can handle volume removal wise and what he can't-  will keep even to pulling only a little today -  fyi-  I dont think we are getting an accurate BP on his leg-  he does not look like he is that hypotensive-  I have mentioned to the nurse to  wean the levophed  Pt will not be physically seen over the weekend-  call with questions   [name]    Labs: Basic Metabolic Panel: Recent Labs  Lab 01/12/23 1224 01/13/23 0421 01/14/23 0421  NA 136 133* 135  K 4.3 3.7 4.2  CL 97* 95* 93*  CO2 '24 26 24  '$ GLUCOSE 124* 91 146*  BUN 49* 32* 38*  CREATININE 12.71* 9.75* 11.75*  CALCIUM 8.1* 7.9* 8.0*   Liver Function Tests: Recent Labs  Lab 01/10/23 1935 01/11/23 0517  AST 35 24  ALT 23 16  ALKPHOS 182* 151*  BILITOT 0.7 0.8  PROT 8.2* 7.0  ALBUMIN 3.3* 2.8*   No results for input(s): "LIPASE", "AMYLASE" in the last 168 hours. No results for input(s): "AMMONIA" in the last 168 hours. CBC: Recent Labs  Lab 01/10/23 1935 01/11/23 0517 01/12/23 1224 01/13/23 0421 01/14/23 0421  WBC 11.6* 11.3* 12.4* 14.1* 17.2*  NEUTROABS 7.3 7.2  --   --   --   HGB 13.4 12.8* 11.6* 11.2* 11.8*  HCT 45.3  44.2 39.8 37.9* 41.0  MCV 97.4 100.9* 99.3 98.2 100.0  PLT 275 235 227 255 274   Cardiac Enzymes: No results for input(s): "CKTOTAL", "CKMB", "CKMBINDEX", "TROPONINI" in the last 168 hours. CBG: Recent Labs  Lab 01/11/23 2109  GLUCAP 96    Iron Studies: No results for input(s): "IRON", "TIBC", "TRANSFERRIN", "FERRITIN" in the last 72 hours. Studies/Results: DG CHEST PORT 1 VIEW  Result Date: 01/14/2023 CLINICAL DATA:  Fever with concern for sepsis EXAM: PORTABLE CHEST 1 VIEW COMPARISON:  Chest radiograph dated 01/10/2023 FINDINGS: Lines/tubes: Unchanged metallic radiodensities projecting over the right hemithorax. Chest: Decreased lung volumes with increased bilateral bronchovascular crowding and bibasilar opacities. Pleura: Unchanged blunting of the right costophrenic angle. No pneumothorax. Heart/mediastinum: Enlarged cardiomediastinal silhouette is likely projectional. Bones: Partially imaged proximal right humeral fixation hardware. IMPRESSION: 1. Decreased lung volumes with increased bilateral bronchovascular  crowding and bibasilar opacities, which may represent atelectasis, aspiration, or pneumonia. 2.  Enlarged cardiomediastinal silhouette is likely projectional. Electronically Signed   By: Darrin Nipper M.D.   On: 01/14/2023 08:20   Medications: Infusions:  sodium chloride 10 mL/hr at 01/14/23 0843   amiodarone 30 mg/hr (01/14/23 0843)   ceFEPime (MAXIPIME) IV 200 mL/hr at 01/14/23 0843   linezolid (ZYVOX) IV Stopped (01/13/23 2332)   metronidazole Stopped (01/14/23 0711)   norepinephrine (LEVOPHED) Adult infusion 9 mcg/min (01/14/23 0843)    Scheduled Medications:  amiodarone  200 mg Oral BID   aspirin EC  81 mg Oral q AM   atorvastatin  80 mg Oral QHS   calcium carbonate  1,500 mg Oral QHS   Chlorhexidine Gluconate Cloth  6 each Topical Q0600   Chlorhexidine Gluconate Cloth  6 each Topical Q0600   famotidine  20 mg Oral QHS   heparin  5,000 Units Subcutaneous Q8H   midodrine  15 mg Oral TID WC   tamsulosin  0.4 mg Oral QPM    have reviewed scheduled and prn medications.  Physical Exam: General:  NAD Heart:irreg/tachy Lungs: mostly clear Abdomen: soft, non tender Extremities: no peripheral edema Dialysis Access: left forearm AVF-  no signs of infection    01/14/2023,9:00 AM  LOS: 4 days

## 2023-01-14 NOTE — Consult Note (Addendum)
[facility] for Infectious Disease  Total days of antibiotics 4         Reason for Consult: fever of unknown origin    Referring Physician: shahmedhi  Principal Problem:   Sepsis ([place]) Active Problems:   Atrial fibrillation with rapid ventricular response (HCC)   GERD (gastroesophageal reflux disease)   ESRD on dialysis ([place])   Hypotension   Mixed hyperlipidemia   History of DVT (deep vein thrombosis)    HPI: [name] is a 57 y.o. male with Afib with watchman device, GERd, HTN, ESRD on HD, hx of DVT to LLE admitted on 01/10/23 for fever intermittent that started 3 days prior to admit. On admit, he was found to have 102F. Also complained of pain to LLE which he was concerned to his prior DVT to last Fall. His HD sessions have been uneventful, no pain to left forearm AV fistula. He was started on empiric abtx. Patient is also on baseline midodrine that complicates the picture to source of hypotension. ID asked to weigh in on management.  Past Medical History:  Diagnosis Date   Chronic heart failure with preserved ejection fraction (HFpEF) ([place])    a. 03/2017 Echo: EF 55-65%, dil RV, RVSP 41mHg, ml RV fxn, mild TR; b. 12/2019 Echo: EF 60-65%, mildly reduced RV fxn, mild BAE, No siginif valvular dzs; c. 12/2021 RHC ([name]): RA 9, RV 57/14, PCWP 16, PA 50/25 (33). CO/CI ([place]) 4.44/2.0.   Coronary artery disease    a. 10/2020 MV: prominently fixed apical defect w/ mod inflat ischemia. Inlat HK. Nl RV fxn; b. 03/2021 PCI LAD.   DV[place]) [place] 08/30/2022   a. L Leg-->completed 3 mos eliquis.   ES[place] ([place] stage renal disease) ([place]    a.  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 s/p PVI 2016; b. CHA2DS2VASc = 3--> was on warfarin and then  eliquis-->08/2021 s/p Watchman device following GIB 03/2021.   Pneumonia    Pulmonary hypertension (HCC)    Sleep apnea    uses bipap nightly    Allergies:  Allergies  Allergen Reactions   Vancomycin Swelling, Hives, Itching, Other (See Comments) and Rash    Lip swelling  Lip swelling, Reaction Type: Allergy; Reaction(s): swelling of lips  Reaction Type: Allergy; Reaction(s): swelling of lips    MEDICATIONS:  aspirin EC  81 mg Oral q AM   atorvastatin  80 mg Oral QHS   calcium carbonate  1,500 mg Oral QHS   Chlorhexidine Gluconate Cloth  6 each Topical Q0600   Chlorhexidine Gluconate Cloth  6 each Topical Q0600   famotidine  20 mg Oral QHS   heparin  5,000 Units Subcutaneous Q8H   midodrine  15 mg Oral TID WC   tamsulosin  0.4 mg Oral QPM    Social History   Tobacco Use   Smoking status: Never   Smokeless tobacco: Never  Vaping Use   Vaping Use: Never used  Substance Use Topics   Alcohol use: No   Drug use: No    Family History  Problem Relation Age of Onset   Hypertension Mother    Heart attack Father    Hypertension Maternal Grandmother    Stroke Maternal Grandfather  Heart attack Paternal Uncle    Heart attack Paternal Uncle    Heart attack Paternal Aunt    Stroke Maternal Uncle     Review of Systems -  Did not interview patient  OBJECTIVE: Temp:  [98.4 F (36.9 C)-102.6 F (39.2 C)] 99.4 F (37.4 C) (03/01 1120) Pulse Rate:  [58-146] 125 (03/01 1200) Resp:  [15-44] 24 (03/01 1200) BP: (47-141)/(17-107) 79/52 (03/01 1200) SpO2:  [91 %-100 %] 91 % (03/01 1200) Did not examine patient  LABS: Results for orders placed or performed during the hospital encounter of 01/10/23 (from the past 48 hour(s))  Basic metabolic panel     Status: Abnormal   Collection Time: 01/13/23  4:21 AM  Result Value Ref Range   Sodium 133 (L) 135 - 145 mmol/L   Potassium 3.7 3.5 - 5.1 mmol/L   Chloride 95 (L) 98 - 111 mmol/L   CO2 26 22 - 32 mmol/L   Glucose, Bld  91 70 - 99 mg/dL    Comment: Glucose reference range applies only to samples taken after fasting for at least 8 hours.   BUN 32 (H) 6 - 20 mg/dL   Creatinine, Ser 9.75 (H) 0.61 - 1.24 mg/dL   Calcium 7.9 (L) 8.9 - 10.3 mg/dL   GFR, Estimated 6 (L) >60 mL/min    Comment: (NOTE) Calculated using the CKD-EPI Creatinine Equation (2021)    Anion gap 12 5 - 15    Comment: Performed at [facility], [address], [place], [place] [address]  CBC     Status: Abnormal   Collection Time: 01/13/23  4:21 AM  Result Value Ref Range   WBC 14.1 (H) 4.0 - 10.5 K/uL   RBC 3.86 (L) 4.22 - 5.81 MIL/uL   Hemoglobin 11.2 (L) 13.0 - 17.0 g/dL   HCT 37.9 (L) 39.0 - 52.0 %   MCV 98.2 80.0 - 100.0 fL   MCH 29.0 26.0 - 34.0 pg   MCHC 29.6 (L) 30.0 - 36.0 g/dL   RDW 17.4 (H) 11.5 - 15.5 %   Platelets 255 150 - 400 K/uL   nRBC 0.0 0.0 - 0.2 %    Comment: Performed at [facility], [address] [place], [place] [address]  Lactic acid, plasma     Status: Abnormal   Collection Time: 01/13/23  4:06 PM  Result Value Ref Range   Lactic Acid, Venous 3.4 (HH) 0.5 - 1.9 mmol/L    Comment: CRITICAL RESULT CALLED TO, READ BACK BY AND VERIFIED WITH FOLEY,B ON 01/13/23 AT 1650 BY LOY,C Performed at [facility], [address], [place], [place] [address]   Lactic acid, plasma     Status: None   Collection Time: 01/13/23  5:42 PM  Result Value Ref Range   Lactic Acid, Venous 1.5 0.5 - 1.9 mmol/L    Comment: Performed at [facility], [name], [place], [place] [address]  Culture, blood (Routine X 2) w Reflex to ID Panel     Status: None (Preliminary result)   Collection Time: 01/13/23  5:42 PM   Specimen: Right Antecubital; Blood  Result Value Ref Range   Specimen Description RIGHT ANTECUBITAL    Special Requests      BOTTLES DRAWN AEROBIC AND ANAEROBIC Blood Culture adequate volume   Culture      NO GROWTH < 12 HOURS Performed at [facility], [address] [place], [place] [address]    Report Status  PENDING   Culture, blood (Routine X 2)  w Reflex to ID Panel     Status: None (Preliminary result)   Collection Time: 01/13/23  5:42 PM   Specimen: BLOOD RIGHT HAND  Result Value Ref Range   Specimen Description BLOOD RIGHT HAND    Special Requests      BOTTLES DRAWN AEROBIC AND ANAEROBIC Blood Culture adequate volume   Culture      NO GROWTH < 12 HOURS Performed at [facility], [address], [place], [place] [address]    Report Status PENDING   Basic metabolic panel     Status: Abnormal   Collection Time: 01/14/23  4:21 AM  Result Value Ref Range   Sodium 135 135 - 145 mmol/L   Potassium 4.2 3.5 - 5.1 mmol/L   Chloride 93 (L) 98 - 111 mmol/L   CO2 24 22 - 32 mmol/L   Glucose, Bld 146 (H) 70 - 99 mg/dL    Comment: Glucose reference range applies only to samples taken after fasting for at least 8 hours.   BUN 38 (H) 6 - 20 mg/dL   Creatinine, Ser 11.75 (H) 0.61 - 1.24 mg/dL   Calcium 8.0 (L) 8.9 - 10.3 mg/dL   GFR, Estimated 5 (L) >60 mL/min    Comment: (NOTE) Calculated using the CKD-EPI Creatinine Equation (2021)    Anion gap 18 (H) 5 - 15    Comment: Performed at [facility], [name], [place], [place] [address]  CBC     Status: Abnormal   Collection Time: 01/14/23  4:21 AM  Result Value Ref Range   WBC 17.2 (H) 4.0 - 10.5 K/uL   RBC 4.10 (L) 4.22 - 5.81 MIL/uL   Hemoglobin 11.8 (L) 13.0 - 17.0 g/dL   HCT 41.0 39.0 - 52.0 %   MCV 100.0 80.0 - 100.0 fL   MCH 28.8 26.0 - 34.0 pg   MCHC 28.8 (L) 30.0 - 36.0 g/dL   RDW 17.5 (H) 11.5 - 15.5 %   Platelets 274 150 - 400 K/uL   nRBC 0.0 0.0 - 0.2 %    Comment: Performed at [facility], [address] [place], [place] [address]    MICRO: 2/29 blood cx ngtd 2/26 blood cx ngtd x 2 MRSA colonization negative Negative DVT for LLE CXR no infiltrate IMAGING: DG CHEST PORT 1 VIEW  Result Date: 01/14/2023 CLINICAL DATA:  Fever with concern for sepsis EXAM: PORTABLE CHEST 1 VIEW COMPARISON:  Chest radiograph dated  01/10/2023 FINDINGS: Lines/tubes: Unchanged metallic radiodensities projecting over the right hemithorax. Chest: Decreased lung volumes with increased bilateral bronchovascular crowding and bibasilar opacities. Pleura: Unchanged blunting of the right costophrenic angle. No pneumothorax. Heart/mediastinum: Enlarged cardiomediastinal silhouette is likely projectional. Bones: Partially imaged proximal right humeral fixation hardware. IMPRESSION: 1. Decreased lung volumes with increased bilateral bronchovascular crowding and bibasilar opacities, which may represent atelectasis, aspiration, or pneumonia. 2.  Enlarged cardiomediastinal silhouette is likely projectional. Electronically Signed   By: Darrin Nipper M.D.   On: 01/14/2023 08:20     Assessment/Plan:  57yo M with esrd on hd with intermittent fevers for the past week including what was reported as outpatient- On broad spectrum Has repeat blood cx from yesterday pending No obvious sources, recommend to get TTE DVT ruled out of LLE, initial blood cx on admit from 2/26 no growth todate Please check cbc with diff tomorrow If febrile tomorrow, recommend chest abd pelvis CT If no sources found after this48hrs, recommend to observe off of abtx  Dr Linus Salmons available to follow  cultures and further recs over the weekend. Will provide further updates on monday

## 2023-01-14 NOTE — Care Management Important Message (Signed)
Important Message  Patient Details  Name: [name] MRN: [id number] Date of Birth: Oct 09, 1966   Medicare Important Message Given:  Yes     Tommy Medal 01/14/2023, 11:00 AM

## 2023-01-14 NOTE — Progress Notes (Signed)
Update: Patient's HD moved to tomorrow given emergent HD case in ER. Discussed with HD RN. Gean Quint, MD [facility]

## 2023-01-14 NOTE — Consult Note (Signed)
[facility] Nurse Consult Note: This consult performed remotely utilizing photos and records in EMR  Patient followed by [facility] Plastic Surgery for full thickness wound L anterior ankle after DVT.  Patient had debridement and NPWT done on 11/30/2022 by plastics, since removal of NPWT has been utilizing collagen dressings at home.  Wound care performed by wife.  Last seen by plastic surgery 01/06/2023 Reason for Consult: L anterior ankle wound  Wound type: full thickness, s/p debridement by plastics 11/30/2022  Pressure Injury POA: NA, not pressure related  Measurement: see nursing flowsheets, last measurements by plastics 8 cms x 6.25 cms  Wound bed: 100% red moist, appears to be tendon showing at superior portion of wound  Drainage (amount, consistency, odor)  see nursing flowsheet  Periwound: intact  Dressing procedure/placement/frequency: Clean L anterior ankle wound with NS, apply Silver Hydrofiber Kellie Simmering [phone]) cut to fit wound bed daily. Cover with dry gauze and  secure with Kerlix and 4"  Ace wrap Kellie Simmering [phone]).  May need to moisten silver with NS if stuck to wound bed at dressing changes.    POC discussed with bedside nurse via secure chat.  WOC will not follow patient at this time.  Re-consult if further needs arise.    Thank you,    Geena Weinhold MSN, RN-BC, [employer]

## 2023-01-14 NOTE — Progress Notes (Signed)
PROGRESS NOTE    [name]  [id number] DOB: 07-01-1966 DOA: 01/10/2023 PCP: Gardiner Rhyme, MD  Brief Narrative:  [name] is a 57 y.o. male with medical history significant of atrial fibrillation (Watchman device implanted), CHF, ESRD on dialysis MWF, DVT of left lower extremity, arterial ulcer of RLE, GERD, HLD, and chronic hypotension on midodrine admitted on 2/26 for A-fib with RVR and soft BP, meeting SIRS criteria for sepsis.  Admitted to ICU at [facility].  Patient had multiple runs of SVT and Cardiology was consulted; recommended amiodarone drip.  Patient continued to have SVT runs intermittently. Nephrology consulted for HD and WOC consulted for care of arterial ulcer on RLE.  Started cefepime, metronidazole, and linezolid on admission, then narrowed to cefepime and linezolid 2/28, but refevered on 2/29.  New blood cultures were obtained on 2/29 due to refevering.  In parallel, WBC continued to trend up from admission through 3/1.  Infectious disease consulted.  Problem List:   Principal Problem:   Sepsis ([place]) Active Problems:   Atrial fibrillation with rapid ventricular response (HCC)   GERD (gastroesophageal reflux disease)   ESRD on dialysis ([place])   Hypotension   Mixed hyperlipidemia   History of DVT (deep vein thrombosis)  Assessment and Plan:  Septic shock Met SIRS criteria on admission Hypotension, chronic ?Pneumonia Patient hypotensive on midodrine during HD at baseline, but still has increased pressor requirement now compared to baseline.  Still no source of infection, all studies and labs negative.  Considering LLE wound, but no signs of infection.  Concerning given refevering and uptrending WBC, ID consulted.  Repeat CXR today showing possible increased R-sided pleural effusion versus consolidation versus atelectasis, appears to be increased from 2/26 and will consider possible pneumonia at this time.  No proof of bacteremia at this time. -- Cefepime,  metronidazole, linezolid -- Norepi for hypotension and wean as tolerated; maintain midodrine TID -- Start arterial line when able for more accurate BP - Blood cultures pending, no growth @ 12 hours -- WBC trending up consistently, lactate normalized, trending procal daily -- WOC reevaluation of LLE wound -- f/u Infectious Disease recommendations  ESRD on hemodialysis MWF Tolerating HD, has AV fistula. -- Appreciate Nephrology recs:  - Renal labs daily  - Has secondary hyperPTH  - On iron and ESA for anemia   Atrial fibrillation with RVR SVT HR varying significantly with ongoing runs of SVT.  Currently on amiodarone gtt per cardiology, did experience some emesis this morning, likely because of amiodarone bolus.  S/p Watchman device, not a candidate for chronic anticoagulation, discontinued May 2022 due to GI bleed. -- Cardiology following closely, appreciate recs: - Holding metoprolol and diltiazem in setting of hypotension - Amiodarone rebolus daily, continue gtt - When rhythm stable, transition to oral amiodarone 200 mg BID for 3 weeks, then 200 mg daily  Arterial ulcer of RLE Does not appear infected, unlikely source of infection for sepsis.  Healing poorly and likely needs vascular surgery outpatient visit. - Wound care per [place] recs   GERD (gastroesophageal reflux disease) - Continue famotidine, also important for stress ulcer PPx best practice   Mixed hyperlipidemia - Continue statin   History of DVT History of DVT in the left lower extremity, finish 90 days of Eliquis.  Complained of left leg swelling, repeat venous Doppler on 01/11/2023 without acute DVT. -- Continue to monitor  Skin Assessment: I have examined the patient's skin and I agree with the wound assessment as performed by the  wound care RN as outlined below:  Wound type: full thickness, s/p debridement by plastics 11/30/2022  Pressure Injury POA: NA, not pressure related  Measurement: see nursing  flowsheets, last measurements by plastics 8 cms x 6.25 cms  Wound bed: 100% red moist, appears to be tendon showing at superior portion of wound  Drainage (amount, consistency, odor)  see nursing flowsheet  Periwound: intact  Dressing procedure/placement/frequency: Clean L anterior ankle wound with NS, apply Silver Hydrofiber Kellie Simmering [phone]) cut to fit wound bed daily. Cover with dry gauze and  secure with Kerlix and 4"  Ace wrap Kellie Simmering [phone]).  May need to moisten silver with NS if stuck to wound bed at dressing changes.  DVT prophylaxis: [place] heparin Stress Ulcer PPx: H2B Code Status: Full Family Communication: Wife called 3/1 AM Disposition Plan: Patient is from: Home Anticipated d/c is to: Pending PT eval Anticipated d/c date is: Pending clinical improvement Patient currently: Treating sepsis Admission status: Inpatient Remains inpatient appropriate because: Critical illness, septic shock  Consultants:  Infectious Disease (3/1) Cardiology Nephrology WOC  Procedures:  DVT US 2/27 unremarkable  Antimicrobials:  Cefepime, linezolid, metronidazole (started 2/27) Briefly paused metronidazole 2/27, but patient refevered 2/29  Subjective: Patient seen and evaluated today with no new acute complaints or concerns. No acute concerns or events noted overnight.  This morning patient, patient is on 9 norepi and 30 amio.  Reports that he is feeling all right but does note that he just had an episode of emesis after experiencing excessive saliva output acutely.  Has not had any nausea or vomiting otherwise this admission.  Denies chest pain, shortness of breath.  Objective: Vitals:   01/14/23 0645 01/14/23 0700 01/14/23 0751 01/14/23 0800  BP: (!) 89/50 96/80  90/66  Pulse: (!) 140 (!) 139  (!) 141  Resp: (!) '30 19  15  '$ Temp:   98.4 F (36.9 C)   TempSrc:   Oral   SpO2: 96% 96%  99%  Weight:      Height:        Intake/Output Summary (Last 24 hours) at 01/14/2023 0809 Last  data filed at 01/14/2023 [id number] Gross per 24 hour  Intake 2392.51 ml  Output 350 ml  Net 2042.51 ml   Filed Weights   01/11/23 1457 01/12/23 1443 01/12/23 1823  Weight: 96.5 kg 96.5 kg 94.5 kg    Examination: General: Adult male resting in bed, no acute distress. HEENT: PERRLA, NCAT. No scleral icterus. Normal dentition. Lungs:Crackles in lower lung fields, particularly at R lung base. Normal WOB on room air. Cardiovascular: Tachycardic, irregular rhythm.  No murmurs/rubs/gallops. Abdomen: Soft, mildly distended, no TTP. GU: Not examined. Extremities: Warm, well perfused. Trace peripheral edema. Skin: Full thickness ulcerated injury ~20 cm in diameter on L anterior ankle.  Moist, pink granulation tissue with some tendon visible. Neuro: A&Ox4.  No focal neurological deficits.  Data Reviewed: I have personally reviewed following labs and imaging studies.  CBC: Recent Labs  Lab 01/10/23 1935 01/11/23 0517 01/12/23 1224 01/13/23 0421 01/14/23 0421  WBC 11.6* 11.3* 12.4* 14.1* 17.2*  NEUTROABS 7.3 7.2  --   --   --   HGB 13.4 12.8* 11.6* 11.2* 11.8*  HCT 45.3 44.2 39.8 37.9* 41.0  MCV 97.4 100.9* 99.3 98.2 100.0  PLT 275 235 227 255 [id number]   Basic Metabolic Panel: Recent Labs  Lab 01/10/23 1935 01/11/23 0517 01/12/23 1224 01/13/23 0421 01/14/23 0421  NA 138 137 136 133* 135  K 4.3 4.2 4.3  3.7 4.2  CL 96* 100 97* 95* 93*  CO2 '25 23 24 26 24  '$ GLUCOSE 90 83 124* 91 146*  BUN 33* 37* 49* 32* 38*  CREATININE 8.99* 10.06* 12.71* 9.75* 11.75*  CALCIUM 8.9 8.2* 8.1* 7.9* 8.0*  MG  --  2.1 2.2  --   --    GFR: Estimated Creatinine Clearance: 8.1 mL/min (A) (by C-G formula based on SCr of 11.75 mg/dL (H)). Liver Function Tests: Recent Labs  Lab 01/10/23 1935 01/11/23 0517  AST 35 24  ALT 23 16  ALKPHOS 182* 151*  BILITOT 0.7 0.8  PROT 8.2* 7.0  ALBUMIN 3.3* 2.8*   No results for input(s): "LIPASE", "AMYLASE" in the last 168 hours. No results for input(s): "AMMONIA"  in the last 168 hours. Coagulation Profile: Recent Labs  Lab 01/10/23 1935  INR 1.1   Cardiac Enzymes: No results for input(s): "CKTOTAL", "CKMB", "CKMBINDEX", "TROPONINI" in the last 168 hours. BNP (last 3 results) No results for input(s): "PROBNP" in the last 8760 hours. HbA1C: No results for input(s): "HGBA1C" in the last 72 hours. CBG: Recent Labs  Lab 01/11/23 2109  GLUCAP 96   Lipid Profile: No results for input(s): "CHOL", "HDL", "LDLCALC", "TRIG", "CHOLHDL", "LDLDIRECT" in the last 72 hours. Thyroid Function Tests: No results for input(s): "TSH", "T4TOTAL", "FREET4", "T3FREE", "THYROIDAB" in the last 72 hours. Anemia Panel: No results for input(s): "VITAMINB12", "FOLATE", "FERRITIN", "TIBC", "IRON", "RETICCTPCT" in the last 72 hours. Sepsis Labs: Recent Labs  Lab 01/10/23 1935 01/10/23 2125 01/11/23 0517 01/12/23 0700 01/13/23 1606 01/13/23 1742  PROCALCITON 6.62  --   --  5.42  --   --   LATICACIDVEN 2.2* 1.9 1.2  --  3.4* 1.5    Recent Results (from the past 240 hour(s))  Culture, blood (Routine X 2) w Reflex to ID Panel     Status: None (Preliminary result)   Collection Time: 01/10/23  7:00 PM   Specimen: BLOOD RIGHT ARM  Result Value Ref Range Status   Specimen Description BLOOD RIGHT ARM  Final   Special Requests   Final    BOTTLES DRAWN AEROBIC AND ANAEROBIC Blood Culture adequate volume   Culture   Final    NO GROWTH 4 DAYS Performed at [facility], [address], [place], [place] [address]    Report Status PENDING  Incomplete  Culture, blood (Routine x 2)     Status: None (Preliminary result)   Collection Time: 01/10/23  7:35 PM   Specimen: BLOOD  Result Value Ref Range Status   Specimen Description BLOOD BLOOD RIGHT WRIST  Final   Special Requests   Final    BOTTLES DRAWN AEROBIC AND ANAEROBIC Blood Culture adequate volume   Culture   Final    NO GROWTH 4 DAYS Performed at [facility], [address], [place], [place] [address]    Report  Status PENDING  Incomplete  Resp panel by RT-PCR (RSV, Flu A&B, Covid) Anterior Nasal Swab     Status: None   Collection Time: 01/10/23  8:18 PM   Specimen: Anterior Nasal Swab  Result Value Ref Range Status   SARS Coronavirus 2 by RT PCR NEGATIVE NEGATIVE Final    Comment: (NOTE) SARS-CoV-2 target nucleic acids are NOT DETECTED.  The SARS-CoV-2 RNA is generally detectable in upper respiratory specimens during the acute phase of infection. The lowest concentration of SARS-CoV-2 viral copies this assay can detect is 138 copies/mL. A negative result does not preclude SARS-Cov-2 infection and should  not be used as the sole basis for treatment or other patient management decisions. A negative result may occur with  improper specimen collection/handling, submission of specimen other than nasopharyngeal swab, presence of viral mutation(s) within the areas targeted by this assay, and inadequate number of viral copies(<138 copies/mL). A negative result must be combined with clinical observations, patient history, and epidemiological information. The expected result is Negative.  Fact Sheet for Patients:  EntrepreneurPulse.com.au  Fact Sheet for Healthcare Providers:  IncredibleEmployment.be  This test is no t yet approved or cleared by the [place] FDA and  has been authorized for detection and/or diagnosis of SARS-CoV-2 by FDA under an Emergency Use Authorization (EUA). This EUA will remain  in effect (meaning this test can be used) for the duration of the COVID-19 declaration under Section 564(b)(1) of the Act, 21 U.S.C.section 360bbb-3(b)(1), unless the authorization is terminated  or revoked sooner.       Influenza A by PCR NEGATIVE NEGATIVE Final   Influenza B by PCR NEGATIVE NEGATIVE Final    Comment: (NOTE) The Xpert Xpress SARS-CoV-2/FLU/RSV plus assay is intended as an aid in the diagnosis of influenza from Nasopharyngeal swab  specimens and should not be used as a sole basis for treatment. Nasal washings and aspirates are unacceptable for Xpert Xpress SARS-CoV-2/FLU/RSV testing.  Fact Sheet for Patients: EntrepreneurPulse.com.au  Fact Sheet for Healthcare Providers: IncredibleEmployment.be  This test is not yet approved or cleared by the [place] FDA and has been authorized for detection and/or diagnosis of SARS-CoV-2 by FDA under an Emergency Use Authorization (EUA). This EUA will remain in effect (meaning this test can be used) for the duration of the COVID-19 declaration under Section 564(b)(1) of the Act, 21 U.S.C. section 360bbb-3(b)(1), unless the authorization is terminated or revoked.     Resp Syncytial Virus by PCR NEGATIVE NEGATIVE Final    Comment: (NOTE) Fact Sheet for Patients: EntrepreneurPulse.com.au  Fact Sheet for Healthcare Providers: IncredibleEmployment.be  This test is not yet approved or cleared by the [place] FDA and has been authorized for detection and/or diagnosis of SARS-CoV-2 by FDA under an Emergency Use Authorization (EUA). This EUA will remain in effect (meaning this test can be used) for the duration of the COVID-19 declaration under Section 564(b)(1) of the Act, 21 U.S.C. section 360bbb-3(b)(1), unless the authorization is terminated or revoked.  Performed at [facility], [address] [place], [place] [address]   MRSA Next Gen by PCR, Nasal     Status: None   Collection Time: 01/11/23  2:27 PM   Specimen: Nasal Mucosa; Nasal Swab  Result Value Ref Range Status   MRSA by PCR Next Gen NOT DETECTED NOT DETECTED Final    Comment: (NOTE) The GeneXpert MRSA Assay (FDA approved for NASAL specimens only), is one component of a comprehensive MRSA colonization surveillance program. It is not intended to diagnose MRSA infection nor to guide or monitor treatment for MRSA infections. Test  performance is not FDA approved in patients less than 62 years old. Performed at [facility], [address] [place], [place] [address]   Culture, blood (Routine X 2) w Reflex to ID Panel     Status: None (Preliminary result)   Collection Time: 01/13/23  5:42 PM   Specimen: Right Antecubital; Blood  Result Value Ref Range Status   Specimen Description RIGHT ANTECUBITAL  Final   Special Requests   Final    BOTTLES DRAWN AEROBIC AND ANAEROBIC Blood Culture adequate volume   Culture  Final    NO GROWTH < 12 HOURS Performed at [facility], [address], [place], [place] [address]    Report Status PENDING  Incomplete  Culture, blood (Routine X 2) w Reflex to ID Panel     Status: None (Preliminary result)   Collection Time: 01/13/23  5:42 PM   Specimen: BLOOD RIGHT HAND  Result Value Ref Range Status   Specimen Description BLOOD RIGHT HAND  Final   Special Requests   Final    BOTTLES DRAWN AEROBIC AND ANAEROBIC Blood Culture adequate volume   Culture   Final    NO GROWTH < 12 HOURS Performed at [facility], [address] [place], [place] [address]    Report Status PENDING  Incomplete     Radiology Studies: No results found.  Scheduled Meds:  amiodarone  200 mg Oral BID   aspirin EC  81 mg Oral q AM   atorvastatin  80 mg Oral QHS   calcium carbonate  1,500 mg Oral QHS   Chlorhexidine Gluconate Cloth  6 each Topical Q0600   Chlorhexidine Gluconate Cloth  6 each Topical Q0600   famotidine  20 mg Oral QHS   heparin  5,000 Units Subcutaneous Q8H   midodrine  15 mg Oral TID WC   tamsulosin  0.4 mg Oral QPM   Continuous Infusions:  sodium chloride 10 mL/hr at 01/14/23 0806   amiodarone 30 mg/hr (01/14/23 0806)   ceFEPime (MAXIPIME) IV 200 mL/hr at 01/14/23 0806   linezolid (ZYVOX) IV Stopped (01/13/23 2332)   metronidazole Stopped (01/14/23 0711)   norepinephrine (LEVOPHED) Adult infusion 9 mcg/min (01/14/23 0806)     LOS: 4 days   Time spent: 35 minutes  Jessen Siegman, MS4 [facility]  If 7PM-7AM, please contact night-coverage [email or web address] 01/14/2023, 8:09 AM

## 2023-01-14 NOTE — Progress Notes (Signed)
Tele reviewed, ongoing runs of SVT. Rebolus amio '150mg'$  daily and continue drip. When rhythm stabilizes transition to oral amio '200mg'$  bid x 3 weeks then '200mg'$  daily   Carlyle Dolly MD

## 2023-01-15 ENCOUNTER — Other Ambulatory Visit (HOSPITAL_COMMUNITY): Payer: Self-pay | Admitting: *Deleted

## 2023-01-15 ENCOUNTER — Inpatient Hospital Stay (HOSPITAL_COMMUNITY): Payer: Medicare Other

## 2023-01-15 DIAGNOSIS — E782 Mixed hyperlipidemia: Secondary | ICD-10-CM | POA: Diagnosis not present

## 2023-01-15 DIAGNOSIS — N186 End stage renal disease: Secondary | ICD-10-CM | POA: Diagnosis not present

## 2023-01-15 DIAGNOSIS — I950 Idiopathic hypotension: Secondary | ICD-10-CM | POA: Diagnosis not present

## 2023-01-15 DIAGNOSIS — A419 Sepsis, unspecified organism: Secondary | ICD-10-CM | POA: Diagnosis not present

## 2023-01-15 LAB — CBC WITH DIFFERENTIAL/PLATELET
Abs Immature Granulocytes: 0.09 10*3/uL — ABNORMAL HIGH (ref 0.00–0.07)
Basophils Absolute: 0.1 10*3/uL (ref 0.0–0.1)
Basophils Relative: 0 %
Eosinophils Absolute: 1.5 10*3/uL — ABNORMAL HIGH (ref 0.0–0.5)
Eosinophils Relative: 10 %
HCT: 37.8 % — ABNORMAL LOW (ref 39.0–52.0)
Hemoglobin: 11.3 g/dL — ABNORMAL LOW (ref 13.0–17.0)
Immature Granulocytes: 1 %
Lymphocytes Relative: 8 %
Lymphs Abs: 1.1 10*3/uL (ref 0.7–4.0)
MCH: 28.8 pg (ref 26.0–34.0)
MCHC: 29.9 g/dL — ABNORMAL LOW (ref 30.0–36.0)
MCV: 96.2 fL (ref 80.0–100.0)
Monocytes Absolute: 1.3 10*3/uL — ABNORMAL HIGH (ref 0.1–1.0)
Monocytes Relative: 9 %
Neutro Abs: 10.6 10*3/uL — ABNORMAL HIGH (ref 1.7–7.7)
Neutrophils Relative %: 72 %
Platelets: 245 10*3/uL (ref 150–400)
RBC: 3.93 MIL/uL — ABNORMAL LOW (ref 4.22–5.81)
RDW: 17.3 % — ABNORMAL HIGH (ref 11.5–15.5)
WBC: 14.6 10*3/uL — ABNORMAL HIGH (ref 4.0–10.5)
nRBC: 0 % (ref 0.0–0.2)

## 2023-01-15 LAB — HEPATIC FUNCTION PANEL
ALT: 7 U/L (ref 0–44)
AST: 24 U/L (ref 15–41)
Albumin: 2.2 g/dL — ABNORMAL LOW (ref 3.5–5.0)
Alkaline Phosphatase: 204 U/L — ABNORMAL HIGH (ref 38–126)
Bilirubin, Direct: 0.1 mg/dL (ref 0.0–0.2)
Indirect Bilirubin: 0.5 mg/dL (ref 0.3–0.9)
Total Bilirubin: 0.6 mg/dL (ref 0.3–1.2)
Total Protein: 6 g/dL — ABNORMAL LOW (ref 6.5–8.1)

## 2023-01-15 LAB — RENAL FUNCTION PANEL
Albumin: 2.2 g/dL — ABNORMAL LOW (ref 3.5–5.0)
Anion gap: 17 — ABNORMAL HIGH (ref 5–15)
BUN: 41 mg/dL — ABNORMAL HIGH (ref 6–20)
CO2: 22 mmol/L (ref 22–32)
Calcium: 7.6 mg/dL — ABNORMAL LOW (ref 8.9–10.3)
Chloride: 92 mmol/L — ABNORMAL LOW (ref 98–111)
Creatinine, Ser: 13.15 mg/dL — ABNORMAL HIGH (ref 0.61–1.24)
GFR, Estimated: 4 mL/min — ABNORMAL LOW (ref >60)
Glucose, Bld: 115 mg/dL — ABNORMAL HIGH (ref 70–99)
Phosphorus: 3.2 mg/dL (ref 2.5–4.6)
Potassium: 3.9 mmol/L (ref 3.5–5.1)
Sodium: 131 mmol/L — ABNORMAL LOW (ref 135–145)

## 2023-01-15 LAB — HEPATITIS B SURFACE ANTIBODY, QUANTITATIVE: Hep B S AB Quant (Post): 35.4 m[IU]/mL (ref >9.9)

## 2023-01-15 LAB — CULTURE, BLOOD (ROUTINE X 2)
Culture: NO GROWTH
Culture: NO GROWTH
Special Requests: ADEQUATE
Special Requests: ADEQUATE

## 2023-01-15 LAB — PROCALCITONIN: Procalcitonin: 11.08 ng/mL

## 2023-01-15 MED ORDER — ALBUMIN HUMAN 25 % IV SOLN
25.0000 g | INTRAVENOUS | Status: AC | PRN
  Administered 2023-01-15 – 2023-01-19 (×3): 25 g via INTRAVENOUS

## 2023-01-15 MED ORDER — AMIODARONE IV BOLUS ONLY 150 MG/100ML
150.0000 mg | Freq: Once | INTRAVENOUS | Status: AC
  Administered 2023-01-15: 150 mg via INTRAVENOUS

## 2023-01-15 MED ORDER — AMIODARONE LOAD VIA INFUSION
150.0000 mg | Freq: Once | INTRAVENOUS | Status: AC
  Administered 2023-01-15: 150 mg via INTRAVENOUS
  Filled 2023-01-15: qty 83.34

## 2023-01-15 MED ORDER — LIDOCAINE HCL (PF) 1 % IJ SOLN: 5.0000 mL | INTRAMUSCULAR | Status: DC | PRN

## 2023-01-15 MED ORDER — PENTAFLUOROPROP-TETRAFLUOROETH EX AERO: 1.0000 | INHALATION_SPRAY | CUTANEOUS | Status: DC | PRN

## 2023-01-15 MED ORDER — LIDOCAINE-PRILOCAINE 2.5-2.5 % EX CREA: 1.0000 | TOPICAL_CREAM | CUTANEOUS | Status: DC | PRN

## 2023-01-15 MED ORDER — LORAZEPAM 2 MG/ML IJ SOLN
1.0000 mg | Freq: Once | INTRAMUSCULAR | Status: AC
  Administered 2023-01-16: 1 mg via INTRAVENOUS
  Filled 2023-01-15: qty 1

## 2023-01-15 MED ORDER — MIDODRINE HCL 5 MG PO TABS
5.0000 mg | ORAL_TABLET | Freq: Three times a day (TID) | ORAL | Status: DC
  Administered 2023-01-16: 5 mg via ORAL
  Filled 2023-01-15: qty 1

## 2023-01-15 MED ORDER — DIPHENHYDRAMINE HCL 25 MG PO CAPS
25.0000 mg | ORAL_CAPSULE | Freq: Four times a day (QID) | ORAL | Status: DC | PRN
  Administered 2023-01-15: 25 mg via ORAL
  Filled 2023-01-15: qty 1

## 2023-01-15 MED ORDER — HEPARIN SODIUM (PORCINE) 1000 UNIT/ML DIALYSIS: 20.0000 [IU]/kg | INTRAMUSCULAR | Status: DC | PRN

## 2023-01-15 NOTE — Progress Notes (Signed)
PROGRESS NOTE    [name]  [id number] DOB: June 05, 1966 DOA: 01/10/2023 PCP: Gardiner Rhyme, MD  Brief Narrative:  [name] is a 57 y.o. male with medical history significant of atrial fibrillation (Watchman device implanted), CHF, ESRD on dialysis MWF, DVT of left lower extremity, arterial ulcer of RLE, GERD, HLD, and chronic hypotension on midodrine admitted on 2/26 for A-fib with RVR and soft BP, meeting SIRS criteria for sepsis.  Admitted to ICU at [facility].  Patient had multiple runs of SVT and Cardiology was consulted; recommended amiodarone drip.  Patient continued to have SVT runs intermittently. Nephrology consulted for HD and WOC consulted for care of arterial ulcer on RLE.   Started cefepime, metronidazole, and linezolid on admission, then narrowed to cefepime and linezolid 2/28, but refevered on 2/29.  New blood cultures were obtained on 2/29 due to refevering.  In parallel, WBC continued to trend up from admission through 3/1, then stabilized.  Infectious disease consulted and recommended TTE.  CXR did show some questionable right lung base pleural effusion versus consolidation consistent with possible pneumonia.  MRSA PCR negative and linezolid stopped.  Problem List:   Principal Problem:   Sepsis ([place]) Active Problems:   Atrial fibrillation with rapid ventricular response (HCC)   GERD (gastroesophageal reflux disease)   ESRD on dialysis ([facility])   Hypotension   Mixed hyperlipidemia   History of DVT (deep vein thrombosis)  Assessment and Plan:  Septic shock Met SIRS criteria on admission Hypotension, chronic ?Pneumonia Still no source of infection, all studies and labs negative though CXR yesterday that shows some worsening right lung base pleural effusion versus consolidation versus atelectasis suspicious for possible pneumonia.  LLE wound without signs of infection.  Improving bacteremia at this time and ID recommended TTE followed by trial off antibiotics if  nothing is found. - Continue cefepime and metronidazole; discontinue linezolid in setting of negative MRSA PCR - Norepi for hypotension and wean as tolerated; maintain midodrine TID - Blood cultures pending, no growth @ 2 days - WBC somewhat trending down or stable, lactate normalized, procal stably elevated - Consider starting arterial line when able for more accurate BP if still concerned about hypotension - Unable to conduct TTE due to persistent tachycardia and will reattempt tomorrow - Appreciate Infectious Disease recs:  - TTE given no obvious infectious etiologies  - If febrile again, recommend CT abdomen, pelvic, chest  - If no sources found within 48 hours of 3/1, recommend observation off abx   ESRD on hemodialysis MWF Tolerating HD, has AV fistula. - Appreciate Nephrology recs, notes:             - Renal labs daily             - Has secondary hyperPTH             - On iron and ESA for anemia   Atrial fibrillation with RVR SVT HR varying significantly with ongoing runs of SVT.  Currently on amiodarone gtt per cardiology, did experience some emesis this morning, likely because of amiodarone bolus.  S/p Watchman device, not a candidate for chronic anticoagulation, discontinued May 2022 due to GI bleed. - Cardiology following closely, appreciate recs: - Holding metoprolol and diltiazem in setting of hypotension - Amiodarone rebolus daily, continue gtt - When rhythm stable, transition to oral amiodarone 200 mg BID for 3 weeks, then 200 mg daily   Arterial ulcer of RLE Does not appear infected, unlikely source of infection for sepsis.  Healing poorly and likely needs vascular surgery outpatient visit. - Wound care per [place] recs   GERD - Continue famotidine, added omeprazole per patient request yesterday; H2B also important for stress ulcer PPx best practice   Mixed hyperlipidemia - Continue statin   History of DVT History of DVT in the left lower extremity, finish 90  days of Eliquis.  Complained of left leg swelling, repeat venous Doppler on 01/11/2023 without acute DVT. - Continue to monitor   Skin Assessment: I have examined the patient's skin and I agree with the wound assessment as performed by the wound care RN as outlined below:   Wound type: full thickness, s/p debridement by plastics 11/30/2022  Pressure Injury POA: NA, not pressure related  Measurement: see nursing flowsheets, last measurements by plastics 8 cms x 6.25 cms  Wound bed: 100% red moist, appears to be tendon showing at superior portion of wound  Drainage (amount, consistency, odor)  see nursing flowsheet  Periwound: intact  Dressing procedure/placement/frequency: Clean L anterior ankle wound with NS, apply Silver Hydrofiber Kellie Simmering [phone]) cut to fit wound bed daily. Cover with dry gauze and  secure with Kerlix and 4"  Ace wrap Kellie Simmering [phone]).  May need to moisten silver with NS if stuck to wound bed at dressing changes.  DVT prophylaxis: [place] heparin Stress Ulcer PPx: H2B & PPI Code Status: Full Family Communication: Wife called 3/1 Disposition Plan: Patient is from: Home Anticipated d/c is to: Pending PT eval Anticipated d/c date is: Pending clinical improvement Patient currently: Pending sepsis workup and arrhythmia stabilization Admission status: Inpatient Remains inpatient appropriate because: Critical illness and sepsis workup  Consultants:  Infectious Disease Cardiology Nephrology WOC   Procedures:  DVT US 2/27 unremarkable   Antimicrobials:  Cefepime, metronidazole (started 2/27) Linezolid (2/27>3/2)  Subjective: Patient seen and evaluated today with no new acute complaints or concerns. No acute concerns or events noted overnight.  This morning, patient does report he is feeling pretty well overall.  Has been able to eat and has no new concerns.  Is looking forward to having more answers about his condition when possible.  Objective: Vitals:   01/15/23  0500 01/15/23 0525 01/15/23 0530 01/15/23 0535  BP:   96/69   Pulse:  76 70 (!) 108  Resp:  18 (!) 26 (!) 27  Temp:      TempSrc:      SpO2:  98% 93% 95%  Weight: 100.7 kg     Height:        Intake/Output Summary (Last 24 hours) at 01/15/2023 0730 Last data filed at 01/15/2023 0545 Gross per 24 hour  Intake 3019.18 ml  Output --  Net 3019.18 ml   Filed Weights   01/12/23 1443 01/12/23 1823 01/15/23 0500  Weight: 96.5 kg 94.5 kg 100.7 kg    Examination: General: Adult male resting in bed, in no acute distress.  In good spirits. HEENT: MMM.  NCAT.  PERRLA. Lungs: Moist crackles at right lung base.  Otherwise no wheezes or rhonchi.  Normal work of breathing on room air. Cardiovascular: Tachycardic, irregular rhythm.  No murmurs/rubs/gallops.  2+ peripheral pulses. Abdomen: No tenderness to palpation, distention, rigidity. Extremities: Warm, perfusing, capillary refill less than 2 seconds.  Trace peripheral edema bilaterally. Skin: Soft, no rashes or lesions grossly. Neuro: Alert and oriented x 4.  No focal neurological deficit.  Data Reviewed: I have personally reviewed following labs and imaging studies.  CBC: Recent Labs  Lab 01/10/23 1935 01/11/23 [id number]  01/12/23 1224 01/13/23 0421 01/14/23 0421 01/15/23 0430  WBC 11.6* 11.3* 12.4* 14.1* 17.2* 14.6*  NEUTROABS 7.3 7.2  --   --   --  10.6*  HGB 13.4 12.8* 11.6* 11.2* 11.8* 11.3*  HCT 45.3 44.2 39.8 37.9* 41.0 37.8*  MCV 97.4 100.9* 99.3 98.2 100.0 96.2  PLT 275 235 227 255 274 [id number]   Basic Metabolic Panel: Recent Labs  Lab 01/11/23 0517 01/12/23 1224 01/13/23 0421 01/14/23 0421 01/15/23 0430  NA 137 136 133* 135 131*  K 4.2 4.3 3.7 4.2 3.9  CL 100 97* 95* 93* 92*  CO2 '23 24 26 24 22  '$ GLUCOSE 83 124* 91 146* 115*  BUN 37* 49* 32* 38* 41*  CREATININE 10.06* 12.71* 9.75* 11.75* 13.15*  CALCIUM 8.2* 8.1* 7.9* 8.0* 7.6*  MG 2.1 2.2  --   --   --   PHOS  --   --   --   --  3.2   GFR: Estimated Creatinine  Clearance: 8 mL/min (A) (by C-G formula based on SCr of 13.15 mg/dL (H)). Liver Function Tests: Recent Labs  Lab 01/10/23 1935 01/11/23 0517 01/15/23 0430  AST 35 24 24  ALT '23 16 7  '$ ALKPHOS 182* 151* 204*  BILITOT 0.7 0.8 0.6  PROT 8.2* 7.0 6.0*  ALBUMIN 3.3* 2.8* 2.2*  2.2*   No results for input(s): "LIPASE", "AMYLASE" in the last 168 hours. No results for input(s): "AMMONIA" in the last 168 hours. Coagulation Profile: Recent Labs  Lab 01/10/23 1935  INR 1.1   Cardiac Enzymes: No results for input(s): "CKTOTAL", "CKMB", "CKMBINDEX", "TROPONINI" in the last 168 hours. BNP (last 3 results) No results for input(s): "PROBNP" in the last 8760 hours. HbA1C: No results for input(s): "HGBA1C" in the last 72 hours. CBG: Recent Labs  Lab 01/11/23 2109  GLUCAP 96   Lipid Profile: No results for input(s): "CHOL", "HDL", "LDLCALC", "TRIG", "CHOLHDL", "LDLDIRECT" in the last 72 hours. Thyroid Function Tests: No results for input(s): "TSH", "T4TOTAL", "FREET4", "T3FREE", "THYROIDAB" in the last 72 hours. Anemia Panel: No results for input(s): "VITAMINB12", "FOLATE", "FERRITIN", "TIBC", "IRON", "RETICCTPCT" in the last 72 hours. Sepsis Labs: Recent Labs  Lab 01/10/23 1935 01/10/23 2125 01/11/23 0517 01/12/23 0700 01/13/23 1606 01/13/23 1742 01/14/23 1318 01/15/23 0430  PROCALCITON 6.62  --   --  5.42  --   --  6.64 11.08  LATICACIDVEN 2.2* 1.9 1.2  --  3.4* 1.5  --   --     Recent Results (from the past 240 hour(s))  Culture, blood (Routine X 2) w Reflex to ID Panel     Status: None   Collection Time: 01/10/23  7:00 PM   Specimen: BLOOD RIGHT ARM  Result Value Ref Range Status   Specimen Description BLOOD RIGHT ARM  Final   Special Requests   Final    BOTTLES DRAWN AEROBIC AND ANAEROBIC Blood Culture adequate volume   Culture   Final    NO GROWTH 5 DAYS Performed at [facility], [address], [place], [place] [address]    Report Status 01/15/2023 FINAL  Final   Culture, blood (Routine x 2)     Status: None   Collection Time: 01/10/23  7:35 PM   Specimen: BLOOD  Result Value Ref Range Status   Specimen Description BLOOD BLOOD RIGHT WRIST  Final   Special Requests   Final    BOTTLES DRAWN AEROBIC AND ANAEROBIC Blood Culture adequate volume   Culture   Final  NO GROWTH 5 DAYS Performed at [facility], [address] [place], [place] [address]    Report Status 01/15/2023 FINAL  Final  Resp panel by RT-PCR (RSV, Flu A&B, Covid) Anterior Nasal Swab     Status: None   Collection Time: 01/10/23  8:18 PM   Specimen: Anterior Nasal Swab  Result Value Ref Range Status   SARS Coronavirus 2 by RT PCR NEGATIVE NEGATIVE Final    Comment: (NOTE) SARS-CoV-2 target nucleic acids are NOT DETECTED.  The SARS-CoV-2 RNA is generally detectable in upper respiratory specimens during the acute phase of infection. The lowest concentration of SARS-CoV-2 viral copies this assay can detect is 138 copies/mL. A negative result does not preclude SARS-Cov-2 infection and should not be used as the sole basis for treatment or other patient management decisions. A negative result may occur with  improper specimen collection/handling, submission of specimen other than nasopharyngeal swab, presence of viral mutation(s) within the areas targeted by this assay, and inadequate number of viral copies(<138 copies/mL). A negative result must be combined with clinical observations, patient history, and epidemiological information. The expected result is Negative.  Fact Sheet for Patients:  EntrepreneurPulse.com.au  Fact Sheet for Healthcare Providers:  IncredibleEmployment.be  This test is no t yet approved or cleared by the [place] FDA and  has been authorized for detection and/or diagnosis of SARS-CoV-2 by FDA under an Emergency Use Authorization (EUA). This EUA will remain  in effect (meaning this test can be used) for the  duration of the COVID-19 declaration under Section 564(b)(1) of the Act, 21 U.S.C.section 360bbb-3(b)(1), unless the authorization is terminated  or revoked sooner.       Influenza A by PCR NEGATIVE NEGATIVE Final   Influenza B by PCR NEGATIVE NEGATIVE Final    Comment: (NOTE) The Xpert Xpress SARS-CoV-2/FLU/RSV plus assay is intended as an aid in the diagnosis of influenza from Nasopharyngeal swab specimens and should not be used as a sole basis for treatment. Nasal washings and aspirates are unacceptable for Xpert Xpress SARS-CoV-2/FLU/RSV testing.  Fact Sheet for Patients: EntrepreneurPulse.com.au  Fact Sheet for Healthcare Providers: IncredibleEmployment.be  This test is not yet approved or cleared by the [place] FDA and has been authorized for detection and/or diagnosis of SARS-CoV-2 by FDA under an Emergency Use Authorization (EUA). This EUA will remain in effect (meaning this test can be used) for the duration of the COVID-19 declaration under Section 564(b)(1) of the Act, 21 U.S.C. section 360bbb-3(b)(1), unless the authorization is terminated or revoked.     Resp Syncytial Virus by PCR NEGATIVE NEGATIVE Final    Comment: (NOTE) Fact Sheet for Patients: EntrepreneurPulse.com.au  Fact Sheet for Healthcare Providers: IncredibleEmployment.be  This test is not yet approved or cleared by the [place] FDA and has been authorized for detection and/or diagnosis of SARS-CoV-2 by FDA under an Emergency Use Authorization (EUA). This EUA will remain in effect (meaning this test can be used) for the duration of the COVID-19 declaration under Section 564(b)(1) of the Act, 21 U.S.C. section 360bbb-3(b)(1), unless the authorization is terminated or revoked.  Performed at [facility], [address] [place], [place] [address]   MRSA Next Gen by PCR, Nasal     Status: None   Collection Time:  01/11/23  2:27 PM   Specimen: Nasal Mucosa; Nasal Swab  Result Value Ref Range Status   MRSA by PCR Next Gen NOT DETECTED NOT DETECTED Final    Comment: (NOTE) The GeneXpert MRSA Assay (FDA approved  for NASAL specimens only), is one component of a comprehensive MRSA colonization surveillance program. It is not intended to diagnose MRSA infection nor to guide or monitor treatment for MRSA infections. Test performance is not FDA approved in patients less than 52 years old. Performed at [facility], [address], [place], [place] [address]   Culture, blood (Routine X 2) w Reflex to ID Panel     Status: None (Preliminary result)   Collection Time: 01/13/23  5:42 PM   Specimen: Right Antecubital; Blood  Result Value Ref Range Status   Specimen Description RIGHT ANTECUBITAL  Final   Special Requests   Final    BOTTLES DRAWN AEROBIC AND ANAEROBIC Blood Culture adequate volume   Culture   Final    NO GROWTH 2 DAYS Performed at [facility], [address], [place], [place] [address]    Report Status PENDING  Incomplete  Culture, blood (Routine X 2) w Reflex to ID Panel     Status: None (Preliminary result)   Collection Time: 01/13/23  5:42 PM   Specimen: BLOOD RIGHT HAND  Result Value Ref Range Status   Specimen Description BLOOD RIGHT HAND  Final   Special Requests   Final    BOTTLES DRAWN AEROBIC AND ANAEROBIC Blood Culture adequate volume   Culture   Final    NO GROWTH 2 DAYS Performed at [facility], [address] [place], [place] [address]    Report Status PENDING  Incomplete     Radiology Studies: DG CHEST PORT 1 VIEW  Result Date: 01/14/2023 CLINICAL DATA:  Fever with concern for sepsis EXAM: PORTABLE CHEST 1 VIEW COMPARISON:  Chest radiograph dated 01/10/2023 FINDINGS: Lines/tubes: Unchanged metallic radiodensities projecting over the right hemithorax. Chest: Decreased lung volumes with increased bilateral bronchovascular crowding and bibasilar opacities. Pleura: Unchanged  blunting of the right costophrenic angle. No pneumothorax. Heart/mediastinum: Enlarged cardiomediastinal silhouette is likely projectional. Bones: Partially imaged proximal right humeral fixation hardware. IMPRESSION: 1. Decreased lung volumes with increased bilateral bronchovascular crowding and bibasilar opacities, which may represent atelectasis, aspiration, or pneumonia. 2.  Enlarged cardiomediastinal silhouette is likely projectional. Electronically Signed   By: Darrin Nipper M.D.   On: 01/14/2023 08:20    Scheduled Meds:  aspirin EC  81 mg Oral q AM   atorvastatin  80 mg Oral QHS   calcium carbonate  1,500 mg Oral QHS   Chlorhexidine Gluconate Cloth  6 each Topical Q0600   famotidine  20 mg Oral QHS   heparin  5,000 Units Subcutaneous Q8H   midodrine  15 mg Oral TID WC   pantoprazole  40 mg Oral Q breakfast   tamsulosin  0.4 mg Oral QPM   Continuous Infusions:  sodium chloride 10 mL/hr at 01/15/23 0545   amiodarone 30 mg/hr (01/15/23 0545)   ceFEPime (MAXIPIME) IV Stopped (01/14/23 2227)   linezolid (ZYVOX) IV Stopped (01/14/23 2347)   metronidazole Stopped (01/15/23 0544)   norepinephrine (LEVOPHED) Adult infusion 4 mcg/min (01/15/23 0545)     LOS: 5 days   Time spent: 35 minutes  [name], MS4 [facility]  If 7PM-7AM, please contact night-coverage [email or web address] 01/15/2023, 7:30 AM

## 2023-01-15 NOTE — Progress Notes (Signed)
  HEMODIALYSIS TREATMENT NOTE:  Off Levophed. Amiodarone infusing at '30mg'$ /h.  SBPs 80s-90s and HR 120s pre-HD.  Pt has no complaints and denies dizziness and dyspnea.  5.6 kg of available weight.  spO2 95% on room air.  D/w Dr. Arlester Marker; orders received for Albumin and to UF as tolerated to remove 1.5 - 2.5L.  Treatment was initiated using left forearm AVF (15g/antegrade) and Albumin 25g was given at the start.  BPs remained soft and HR fluctuated between rate-controlled Afib 80s-90s to ST 130s.  Pt remained asymptomatic and maintains that "until you get the infection sorted out my heart is going to keep doing this, but I feel fine."  During the last hour of HD HR^ to 140s, sustained--asymptomatic.  Qb was lowered and UF was paused. Dr. Wynetta Emery was notified.  HR 140s for the last 45 minutes of treatment.  After treatment, during de-cannulation, HR started fluctuating between Afib 80-90s and ST 130s.  Total run time: 3:45 Net UF:  2 liters    Post-dialysis:  01/15/23 1547  Vital Signs  Pulse Rate (!) 110  Pulse Rate Source Monitor  Resp 20  BP (!) 87/60  BP Location Right Arm  BP Method Automatic  Patient Position (if appropriate) Lying  Oxygen Therapy  SpO2 100 %  O2 Device Room Air  Dialysis Weight  Weight 98.3 kg  Type of Weight Post-Dialysis  Post Treatment  Dialyzer Clearance Lightly streaked  Duration of HD Treatment -hour(s) 3.75 hour(s)  Hemodialysis Intake (mL) 0 mL  Liters Processed 84.2  Fluid Removed (mL) 2000 mL  Tolerated HD Treatment No (Comment)  Post-Hemodialysis Comments See PN - tachycardia warranted interruption of UF  AVG/AVF Arterial Site Held (minutes) 5 minutes  AVG/AVF Venous Site Held (minutes) 5 minutes  Fistula / Graft Left Forearm Arteriovenous fistula  No placement date or time found.   Placed prior to admission: Yes  Orientation: Left  Access Location: Forearm  Access Type: Arteriovenous fistula  Fistula / Graft Assessment Thrill;Bruit  Status  Patent  Education / Care Plan  Dialysis Education Provided Yes  Documented Education in Care Plan Yes  Outpatient Plan of Care Reviewed and on Chart Yes    Rockwell Alexandria, RN AP KDU

## 2023-01-15 NOTE — Progress Notes (Signed)
Pt afebrile this shift.  Tylenol x 2 for headaches.  Pt on room air while awake, CPAP at noc.  Pressures fluctuating throughout noc due to positioning, pt cannot tolerate cuff on arm all night.   Pt also reports that attempts to obtain manual pressure unsuccessful.  Levophed maintained at current rate of 4 mcg.  Pt AFIB with PVCs on monitor, rate 80s-130s.  Pt states that his rate will continue this way due to his PNA and will resolve once infection resolves.  Amio at 30 mcg.    Pt anuric.  Plans for dialysis today.

## 2023-01-15 NOTE — Progress Notes (Signed)
Pharmacy Antibiotic Note  [name] is a 57 y.o. male admitted on 01/10/2023 with sepsis.  Pharmacy has been consulted for Cefepime dosing. WBC 11.6. ESRD on HD.  AF, cultures no growth to date. ID following and Plan to stop and observe off abx in 48 hours if remains afebrile and no source found.    Plan: Continue Cefepime 1g IV q24h Zyvox per MD-appears to have true vancomycin allergy Trend WBC, temp, HD schedule F/U infectious work-up   Temp (24hrs), Avg:98.9 F (37.2 C), Min:97.6 F (36.4 C), Max:100 F (37.8 C)  Recent Labs  Lab 01/10/23 1935 01/10/23 2125 01/11/23 0517 01/12/23 1224 01/13/23 0421 01/13/23 1606 01/13/23 1742 01/14/23 0421 01/15/23 0430  WBC 11.6*  --  11.3* 12.4* 14.1*  --   --  17.2* 14.6*  CREATININE 8.99*  --  10.06* 12.71* 9.75*  --   --  11.75* 13.15*  LATICACIDVEN 2.2* 1.9 1.2  --   --  3.4* 1.5  --   --      Estimated Creatinine Clearance: 8 mL/min (A) (by C-G formula based on SCr of 13.15 mg/dL (H)).    Allergies  Allergen Reactions   Vancomycin Swelling, Hives, Itching, Other (See Comments) and Rash    Lip swelling  Lip swelling, Reaction Type: Allergy; Reaction(s): swelling of lips  Reaction Type: Allergy; Reaction(s): swelling of lips   ABX:  Cefepime 2/26 >> Zyvox 2/26 >> Flagyl 2/26  Cultures: 2/29 BCX: ngtd 2/26 Bcx: ngtd  2/27 MRSA PCR: neg   Isac Sarna, BS Pharm D, BCPS Clinical Pharmacist

## 2023-01-15 NOTE — Progress Notes (Signed)
*  PRELIMINARY RESULTS* Echocardiogram 2D Echocardiogram was not performed due to increased heart rate. Dr. Wynetta Emery asked for echo to be done tomorrow.  Samuel Germany 01/15/2023, 11:11 AM

## 2023-01-16 ENCOUNTER — Inpatient Hospital Stay (HOSPITAL_COMMUNITY): Payer: Medicare Other

## 2023-01-16 DIAGNOSIS — R509 Fever, unspecified: Secondary | ICD-10-CM | POA: Diagnosis not present

## 2023-01-16 DIAGNOSIS — I4891 Unspecified atrial fibrillation: Secondary | ICD-10-CM | POA: Diagnosis not present

## 2023-01-16 DIAGNOSIS — A419 Sepsis, unspecified organism: Secondary | ICD-10-CM | POA: Diagnosis not present

## 2023-01-16 DIAGNOSIS — N186 End stage renal disease: Secondary | ICD-10-CM | POA: Diagnosis not present

## 2023-01-16 DIAGNOSIS — K219 Gastro-esophageal reflux disease without esophagitis: Secondary | ICD-10-CM | POA: Diagnosis not present

## 2023-01-16 LAB — CBC WITH DIFFERENTIAL/PLATELET
Abs Immature Granulocytes: 0.11 10*3/uL — ABNORMAL HIGH (ref 0.00–0.07)
Basophils Absolute: 0.1 10*3/uL (ref 0.0–0.1)
Basophils Relative: 0 %
Eosinophils Absolute: 0.8 10*3/uL — ABNORMAL HIGH (ref 0.0–0.5)
Eosinophils Relative: 6 %
HCT: 37.9 % — ABNORMAL LOW (ref 39.0–52.0)
Hemoglobin: 11.4 g/dL — ABNORMAL LOW (ref 13.0–17.0)
Immature Granulocytes: 1 %
Lymphocytes Relative: 6 %
Lymphs Abs: 0.8 10*3/uL (ref 0.7–4.0)
MCH: 28.6 pg (ref 26.0–34.0)
MCHC: 30.1 g/dL (ref 30.0–36.0)
MCV: 95 fL (ref 80.0–100.0)
Monocytes Absolute: 1.4 10*3/uL — ABNORMAL HIGH (ref 0.1–1.0)
Monocytes Relative: 10 %
Neutro Abs: 10.6 10*3/uL — ABNORMAL HIGH (ref 1.7–7.7)
Neutrophils Relative %: 77 %
Platelets: 252 10*3/uL (ref 150–400)
RBC: 3.99 MIL/uL — ABNORMAL LOW (ref 4.22–5.81)
RDW: 17.7 % — ABNORMAL HIGH (ref 11.5–15.5)
WBC: 13.8 10*3/uL — ABNORMAL HIGH (ref 4.0–10.5)
nRBC: 0 % (ref 0.0–0.2)

## 2023-01-16 LAB — RENAL FUNCTION PANEL
Albumin: 2.5 g/dL — ABNORMAL LOW (ref 3.5–5.0)
Anion gap: 16 — ABNORMAL HIGH (ref 5–15)
BUN: 24 mg/dL — ABNORMAL HIGH (ref 6–20)
CO2: 25 mmol/L (ref 22–32)
Calcium: 7.7 mg/dL — ABNORMAL LOW (ref 8.9–10.3)
Chloride: 91 mmol/L — ABNORMAL LOW (ref 98–111)
Creatinine, Ser: 8.95 mg/dL — ABNORMAL HIGH (ref 0.61–1.24)
GFR, Estimated: 6 mL/min — ABNORMAL LOW (ref >60)
Glucose, Bld: 84 mg/dL (ref 70–99)
Phosphorus: 2.4 mg/dL — ABNORMAL LOW (ref 2.5–4.6)
Potassium: 4.1 mmol/L (ref 3.5–5.1)
Sodium: 132 mmol/L — ABNORMAL LOW (ref 135–145)

## 2023-01-16 LAB — PROCALCITONIN: Procalcitonin: 15.7 ng/mL

## 2023-01-16 LAB — ECHOCARDIOGRAM COMPLETE
AR max vel: 2.32 cm2
AV Area VTI: 2.58 cm2
AV Area mean vel: 2.61 cm2
AV Mean grad: 6 mmHg
AV Peak grad: 12.4 mmHg
Ao pk vel: 1.76 m/s
Area-P 1/2: 4.21 cm2
Height: 73 in
MV VTI: 3.5 cm2
S' Lateral: 2.8 cm
Weight: 3467.39 oz

## 2023-01-16 LAB — MAGNESIUM: Magnesium: 1.7 mg/dL (ref 1.7–2.4)

## 2023-01-16 MED ORDER — MIDODRINE HCL 5 MG PO TABS
15.0000 mg | ORAL_TABLET | Freq: Three times a day (TID) | ORAL | Status: DC
  Administered 2023-01-16 – 2023-01-25 (×27): 15 mg via ORAL
  Filled 2023-01-16 (×26): qty 3

## 2023-01-16 MED ORDER — MAGNESIUM SULFATE 50 % IJ SOLN: 3.0000 g | Freq: Once | INTRAVENOUS | Status: DC

## 2023-01-16 MED ORDER — MIDODRINE HCL 5 MG PO TABS
10.0000 mg | ORAL_TABLET | Freq: Once | ORAL | Status: AC
  Administered 2023-01-16: 10 mg via ORAL
  Filled 2023-01-16: qty 2

## 2023-01-16 MED ORDER — MAGNESIUM SULFATE 2 GM/50ML IV SOLN
2.0000 g | Freq: Once | INTRAVENOUS | Status: AC
  Administered 2023-01-16: 2 g via INTRAVENOUS
  Filled 2023-01-16: qty 50

## 2023-01-16 MED ORDER — SODIUM CHLORIDE 0.9 % IV SOLN
2.0000 g | INTRAVENOUS | Status: DC
  Administered 2023-01-16 – 2023-01-17 (×2): 2 g via INTRAVENOUS
  Filled 2023-01-16 (×2): qty 20

## 2023-01-16 MED ORDER — ALBUMIN HUMAN 25 % IV SOLN
12.5000 g | Freq: Once | INTRAVENOUS | Status: AC
  Administered 2023-01-17: 12.5 g via INTRAVENOUS
  Filled 2023-01-16: qty 50

## 2023-01-16 MED ORDER — MAGNESIUM SULFATE IN D5W 1-5 GM/100ML-% IV SOLN
1.0000 g | Freq: Once | INTRAVENOUS | Status: AC
  Administered 2023-01-16: 1 g via INTRAVENOUS
  Filled 2023-01-16: qty 100

## 2023-01-16 MED ORDER — SODIUM CHLORIDE 0.9 % IV BOLUS
250.0000 mL | Freq: Once | INTRAVENOUS | Status: AC
  Administered 2023-01-16: 250 mL via INTRAVENOUS

## 2023-01-16 MED ORDER — SODIUM CHLORIDE 0.9 % IV BOLUS
250.0000 mL | Freq: Once | INTRAVENOUS | Status: AC
  Administered 2023-01-17: 250 mL via INTRAVENOUS

## 2023-01-16 NOTE — Progress Notes (Signed)
Patient alert and oriented all shift. Blood pressures soft at times, patient asymptomatic and Dr Wynetta Emery aware. Dr Wynetta Emery made aware of patient temp reading 101.2 orally this afternoon, writer in to assess and re-did temp with result of 99.1 orally. Patient had stated he drank something warm before the 101.2 temp was done. Patient had no complaints of pain, shortness of breath, chest discomfort, nausea or vomiting.

## 2023-01-16 NOTE — Progress Notes (Addendum)
PROGRESS NOTE   [name]  [id number] DOB: Mar 24, 1966 DOA: 01/10/2023 PCP: Gardiner Rhyme, MD   Chief Complaint  Patient presents with   Fever   Level of care: ICU  Brief Admission History:  57 y.o. male with medical history significant of atrial fibrillation, CHF, ESRD on dialysis Monday Wednesday Friday, DVT of left lower extremity, GERD, hyperlipidemia, hypertension, and more presents the ED with a chief complaint of fever.  Patient reports that he had a fever that started 3 days ago.  It seemed to be resolving 2 days ago, and he felt good yesterday.  Fever returned again today.  He reports a Tmax at home of 102.4.  Patient arrived with a temperature of 102.  Patient reports that his only symptom out of the norm for him is been left lower extremity soreness.  He was worried he had a DVT there because it feels the same as when he did have a DVT in October.  Patient reports that he finished 90 days of Eliquis back in October, but has not continued to be on anticoagulation.  For A-fib, patient has Watchman device.  Patient describes this pain is a soreness in the back of his calf.  He does have a ulcer lower on that extremity.  He has been treating that per wound care and is due for his last skin graft coming up.  Patient reports that it has not been draining anything.  He reports the wound is as clean as can be.  Patient denies any cough, nausea, vomiting, diarrhea.  Patient does not make urine.  He reports that his stomach has been "gurgly" lately, and that his wife thought he had a GI virus.  He reports that everybody he knew with a GI virus was having nausea and vomiting, and he was not having any of the symptoms.  He has had a decreased appetite.  His last normal meal was about 4 days ago.  He is only snack since then.  Patient did go to dialysis today.  He has no other complaints at this time.   Patient reports every time he comes into the hospital he has A-fib with RVR.  He is only  started on amiodarone and discharged with the same.  Every time his cardiologist advises him not to take amiodarone.   Patient does not smoke, does not drink, does not use illicit drugs.  He is vaccinated for COVID and pneumonia.  Patient is full code.   Assessment and Plan: * Sepsis ([place]) - possibly secondary to pneumonia noted on CXR in the right lung base - CXR today showing signs of improvement   - POA: Temp 102, HR up to 150, Bp at low as 78/44, lactic acid 2.2 - Negative COVID, flu, RSV - Blood cultures >>>> NGTD - Patient initially started on cefepime, vancomycin, Flagyl; MRSA screen negative;  - wife insists he be put on ceftriaxone, now off Zyvox/vanc/cefepime - clinically he is improving - check TTE as part of infectious work up  - appreciate ID consult and recommendations   History of DVT (deep vein thrombosis) - History of DVT in the left lower extremity - Finished 90 days of Eliquis - Similar symptoms now - Ultrasound DVT left lower extremity neg for DVT  -Left lower extremity superficial wound does not appear to be infected -Continue local wound care  Mixed hyperlipidemia - Continue statin  Hypotension - Continue high dose midodrine on dialysis days (20 mg); 15 mg on non HD days -  He has been weaned off norepinephrine and will try not to restart this unless absolutely needed  - Continue to monitor  ESRD on dialysis [facility]) - Dialysis Monday Wednesday Friday - s/p hemodialysis 01/12/2023-tolerated well - Consulted nephro for routine dialysis, last HD was 3/2  - Continue daily calcium carbonate - monitor electrolytes  - Midodrine 20 mg on dialysis days - Continue to monitor  GERD (gastroesophageal reflux disease) - Continue Pepcid  Atrial fibrillation with rapid ventricular response (HCC) - Heart rate continues to fluctuate between A-fib, SVTs Heart rate up to 150s -Started on amiodarone drip -in the setting of soft blood pressure, heart rate remains  uncontrolled despite several boluses of IV amiodarone; he likely will need a cardioversion.  Will make NPO after midnight in the case that cardiology team might be able to cardiovert him on 3/4.   -Cardiology consulted-appreciate close follow-up on recommendations  - Blood pressure is too low for metoprolol or Cardizem  - Patient is not on anticoagulation but does have a Watchman - status post watchman (08/2021).    - prior to admission he had taken metoprolol on Tuesday, Thursday, Saturday, Sundays - we have held due to very soft blood pressures   - He requires high doses of midodrine to maintain BP which is also making the tachycardia much more difficult to control   DVT prophylaxis: [place] heparin Code Status: Full  Family Communication: wife telephone 3/3 Disposition: Status is: Inpatient Remains inpatient appropriate because: intensity, IV antibiotics;    Consultants:  ID nephrology  Procedures:  Hemodialysis Echocardiogram 2D Antimicrobials:  Cefepime 2/26>3/3 Vanc discontinued Metronidazole 2/26> Ceftriaxone 3/3>  Subjective: Pt reports overall he is feeling better.  No CP, no SOB.  Occasional palpitations. Cough nonproductive.  Objective: Vitals:   01/16/23 1155 01/16/23 1200 01/16/23 1230 01/16/23 1300  BP:  (!) 84/59 (!) 92/55 (!) 94/53  Pulse: 93 78 87 80  Resp: '20 19  19  '$ Temp:      TempSrc:      SpO2: 98% 97%    Weight:      Height:        Intake/Output Summary (Last 24 hours) at 01/16/2023 1358 Last data filed at 01/16/2023 1149 Gross per 24 hour  Intake 1237.63 ml  Output 2000 ml  Net -762.37 ml   Filed Weights   01/15/23 0500 01/15/23 1115 01/15/23 1547  Weight: 100.7 kg 100.1 kg 98.3 kg   Examination:  General exam: Appears calm and comfortable  Respiratory system: Clear to auscultation. Respiratory effort normal. Cardiovascular system: normal S1 & S2 heard. No JVD, murmurs, rubs, gallops or clicks. No pedal edema. Gastrointestinal system:  Abdomen is nondistended, soft and nontender. No organomegaly or masses felt. Normal bowel sounds heard. Central nervous system: Alert and oriented. No focal neurological deficits. Extremities: left leg open wound does not appear infected  Skin: left leg ulcer noted above (see photo) Psychiatry: Judgement and insight appear normal. Mood & affect appropriate.   Data Reviewed: I have personally reviewed following labs and imaging studies  CBC: Recent Labs  Lab 01/10/23 1935 01/11/23 0517 01/12/23 1224 01/13/23 0421 01/14/23 0421 01/15/23 0430 01/16/23 0352  WBC 11.6* 11.3* 12.4* 14.1* 17.2* 14.6* 13.8*  NEUTROABS 7.3 7.2  --   --   --  10.6* 10.6*  HGB 13.4 12.8* 11.6* 11.2* 11.8* 11.3* 11.4*  HCT 45.3 44.2 39.8 37.9* 41.0 37.8* 37.9*  MCV 97.4 100.9* 99.3 98.2 100.0 96.2 95.0  PLT 275 235 227 255 274 245 252  Basic Metabolic Panel: Recent Labs  Lab 01/11/23 0517 01/12/23 1224 01/13/23 0421 01/14/23 0421 01/15/23 0430 01/16/23 0352  NA 137 136 133* 135 131* 132*  K 4.2 4.3 3.7 4.2 3.9 4.1  CL 100 97* 95* 93* 92* 91*  CO2 '23 24 26 24 22 25  '$ GLUCOSE 83 124* 91 146* 115* 84  BUN 37* 49* 32* 38* 41* 24*  CREATININE 10.06* 12.71* 9.75* 11.75* 13.15* 8.95*  CALCIUM 8.2* 8.1* 7.9* 8.0* 7.6* 7.7*  MG 2.1 2.2  --   --   --  1.7  PHOS  --   --   --   --  3.2 2.4*    CBG: Recent Labs  Lab 01/11/23 2109  GLUCAP 96    Recent Results (from the past 240 hour(s))  Culture, blood (Routine X 2) w Reflex to ID Panel     Status: None   Collection Time: 01/10/23  7:00 PM   Specimen: BLOOD RIGHT ARM  Result Value Ref Range Status   Specimen Description BLOOD RIGHT ARM  Final   Special Requests   Final    BOTTLES DRAWN AEROBIC AND ANAEROBIC Blood Culture adequate volume   Culture   Final    NO GROWTH 5 DAYS Performed at [facility], [address], [place], [place] [address]    Report Status 01/15/2023 FINAL  Final  Culture, blood (Routine x 2)     Status: None    Collection Time: 01/10/23  7:35 PM   Specimen: BLOOD  Result Value Ref Range Status   Specimen Description BLOOD BLOOD RIGHT WRIST  Final   Special Requests   Final    BOTTLES DRAWN AEROBIC AND ANAEROBIC Blood Culture adequate volume   Culture   Final    NO GROWTH 5 DAYS Performed at [facility], [address], [place], [place] [address]    Report Status 01/15/2023 FINAL  Final  Resp panel by RT-PCR (RSV, Flu A&B, Covid) Anterior Nasal Swab     Status: None   Collection Time: 01/10/23  8:18 PM   Specimen: Anterior Nasal Swab  Result Value Ref Range Status   SARS Coronavirus 2 by RT PCR NEGATIVE NEGATIVE Final    Comment: (NOTE) SARS-CoV-2 target nucleic acids are NOT DETECTED.  The SARS-CoV-2 RNA is generally detectable in upper respiratory specimens during the acute phase of infection. The lowest concentration of SARS-CoV-2 viral copies this assay can detect is 138 copies/mL. A negative result does not preclude SARS-Cov-2 infection and should not be used as the sole basis for treatment or other patient management decisions. A negative result may occur with  improper specimen collection/handling, submission of specimen other than nasopharyngeal swab, presence of viral mutation(s) within the areas targeted by this assay, and inadequate number of viral copies(<138 copies/mL). A negative result must be combined with clinical observations, patient history, and epidemiological information. The expected result is Negative.  Fact Sheet for Patients:  EntrepreneurPulse.com.au  Fact Sheet for Healthcare Providers:  IncredibleEmployment.be  This test is no t yet approved or cleared by the [place] FDA and  has been authorized for detection and/or diagnosis of SARS-CoV-2 by FDA under an Emergency Use Authorization (EUA). This EUA will remain  in effect (meaning this test can be used) for the duration of the COVID-19 declaration under Section  564(b)(1) of the Act, 21 U.S.C.section 360bbb-3(b)(1), unless the authorization is terminated  or revoked sooner.       Influenza A by PCR NEGATIVE NEGATIVE Final  Influenza B by PCR NEGATIVE NEGATIVE Final    Comment: (NOTE) The Xpert Xpress SARS-CoV-2/FLU/RSV plus assay is intended as an aid in the diagnosis of influenza from Nasopharyngeal swab specimens and should not be used as a sole basis for treatment. Nasal washings and aspirates are unacceptable for Xpert Xpress SARS-CoV-2/FLU/RSV testing.  Fact Sheet for Patients: EntrepreneurPulse.com.au  Fact Sheet for Healthcare Providers: IncredibleEmployment.be  This test is not yet approved or cleared by the [place] FDA and has been authorized for detection and/or diagnosis of SARS-CoV-2 by FDA under an Emergency Use Authorization (EUA). This EUA will remain in effect (meaning this test can be used) for the duration of the COVID-19 declaration under Section 564(b)(1) of the Act, 21 U.S.C. section 360bbb-3(b)(1), unless the authorization is terminated or revoked.     Resp Syncytial Virus by PCR NEGATIVE NEGATIVE Final    Comment: (NOTE) Fact Sheet for Patients: EntrepreneurPulse.com.au  Fact Sheet for Healthcare Providers: IncredibleEmployment.be  This test is not yet approved or cleared by the [place] FDA and has been authorized for detection and/or diagnosis of SARS-CoV-2 by FDA under an Emergency Use Authorization (EUA). This EUA will remain in effect (meaning this test can be used) for the duration of the COVID-19 declaration under Section 564(b)(1) of the Act, 21 U.S.C. section 360bbb-3(b)(1), unless the authorization is terminated or revoked.  Performed at [facility], [address] [place], [place] [address]   MRSA Next Gen by PCR, Nasal     Status: None   Collection Time: 01/11/23  2:27 PM   Specimen: Nasal Mucosa; Nasal Swab   Result Value Ref Range Status   MRSA by PCR Next Gen NOT DETECTED NOT DETECTED Final    Comment: (NOTE) The GeneXpert MRSA Assay (FDA approved for NASAL specimens only), is one component of a comprehensive MRSA colonization surveillance program. It is not intended to diagnose MRSA infection nor to guide or monitor treatment for MRSA infections. Test performance is not FDA approved in patients less than 38 years old. Performed at [facility], [address] [place], [place] [address]   Culture, blood (Routine X 2) w Reflex to ID Panel     Status: None (Preliminary result)   Collection Time: 01/13/23  5:42 PM   Specimen: Right Antecubital; Blood  Result Value Ref Range Status   Specimen Description RIGHT ANTECUBITAL  Final   Special Requests   Final    BOTTLES DRAWN AEROBIC AND ANAEROBIC Blood Culture adequate volume   Culture   Final    NO GROWTH 3 DAYS Performed at [facility], [address], [place], [place] [address]    Report Status PENDING  Incomplete  Culture, blood (Routine X 2) w Reflex to ID Panel     Status: None (Preliminary result)   Collection Time: 01/13/23  5:42 PM   Specimen: BLOOD RIGHT HAND  Result Value Ref Range Status   Specimen Description BLOOD RIGHT HAND  Final   Special Requests   Final    BOTTLES DRAWN AEROBIC AND ANAEROBIC Blood Culture adequate volume   Culture   Final    NO GROWTH 3 DAYS Performed at [facility], [address] [place],  [address]    Report Status PENDING  Incomplete     Radiology Studies: ECHOCARDIOGRAM COMPLETE  Result Date: 01/16/2023    ECHOCARDIOGRAM REPORT   Patient Name:   [facility] Date of Exam: 01/16/2023 Medical Rec #:  [id number]      Height:  73.0 in Accession #:    [id number]     Weight:       216.7 lb Date of Birth:  [birth year]      BSA:          2.226 m Patient Age:    34 years       BP:           81/27 mmHg Patient Gender: M              HR:           131 bpm. Exam Location:  [facility] Procedure: 2D  Echo, Cardiac Doppler and Color Doppler Indications:    Fever  History:        Patient has prior history of Echocardiogram examinations, most                 recent 11/18/2016. CHF, Arrythmias:Atrial Fibrillation,                 Signs/Symptoms:Fever; Risk Factors:Dyslipidemia. ESRD on                 Dialysis.  Sonographer:    Wenda Low Referring Phys: [place]  1. Left ventricular ejection fraction, by estimation, is 65 to 70%. The left ventricle has normal function. The left ventricle has no regional wall motion abnormalities. There is moderate asymmetric left ventricular hypertrophy of the basal-septal segment. Left ventricular diastolic parameters are indeterminate.  2. Right ventricular systolic function is mildly reduced. The right ventricular size is mildly enlarged. There is normal pulmonary artery systolic pressure. The estimated right ventricular systolic pressure is [id number] mmHg.  3. Right atrial size was mildly dilated.  4. The mitral valve is degenerative. Trivial mitral valve regurgitation.  5. The aortic valve was not well visualized. Aortic valve regurgitation is not visualized. Aortic valve sclerosis/calcification is present, without any evidence of aortic stenosis.  6. The inferior vena cava is dilated in size with >50% respiratory variability, suggesting right atrial pressure of 8 mmHg. FINDINGS  Left Ventricle: Left ventricular ejection fraction, by estimation, is 65 to 70%. The left ventricle has normal function. The left ventricle has no regional wall motion abnormalities. The left ventricular internal cavity size was small. There is moderate  asymmetric left ventricular hypertrophy of the basal-septal segment. Left ventricular diastolic parameters are indeterminate. Right Ventricle: The right ventricular size is mildly enlarged. Right vetricular wall thickness was not well visualized. Right ventricular systolic function is mildly reduced. There is normal pulmonary  artery systolic pressure. The tricuspid regurgitant velocity is 2.52 m/s, and with an assumed right atrial pressure of 8 mmHg, the estimated right ventricular systolic pressure is [id number] mmHg. Left Atrium: Left atrial size was normal in size. Right Atrium: Right atrial size was mildly dilated. Pericardium: There is no evidence of pericardial effusion. Mitral Valve: The mitral valve is degenerative in appearance. Trivial mitral valve regurgitation. MV peak gradient, 6.2 mmHg. The mean mitral valve gradient is 3.0 mmHg. Tricuspid Valve: The tricuspid valve is normal in structure. Tricuspid valve regurgitation is trivial. Aortic Valve: The aortic valve was not well visualized. Aortic valve regurgitation is not visualized. Aortic valve sclerosis/calcification is present, without any evidence of aortic stenosis. Aortic valve mean gradient measures 6.0 mmHg. Aortic valve peak gradient measures 12.4 mmHg. Aortic valve area, by VTI measures 2.58 cm. Pulmonic Valve: The pulmonic valve was not well visualized. Pulmonic valve regurgitation is not visualized. Aorta: The aortic root is normal in size and structure.  Venous: The inferior vena cava is dilated in size with greater than 50% respiratory variability, suggesting right atrial pressure of 8 mmHg. IAS/Shunts: No atrial level shunt detected by color flow Doppler.  LEFT VENTRICLE PLAX 2D LVIDd:         4.20 cm   Diastology LVIDs:         2.80 cm   LV e' medial:    7.51 cm/s LV PW:         1.40 cm   LV E/e' medial:  14.5 LV IVS:        1.40 cm   LV e' lateral:   7.62 cm/s LVOT diam:     2.10 cm   LV E/e' lateral: 14.3 LV SV:         68 LV SV Index:   30 LVOT Area:     3.46 cm  RIGHT VENTRICLE RV Basal diam:  3.95 cm RV Mid diam:    3.10 cm RV S prime:     13.10 cm/s LEFT ATRIUM           Index        RIGHT ATRIUM           Index LA diam:      4.40 cm 1.98 cm/m   RA Area:     22.90 cm LA Vol (A2C): 47.0 ml 21.11 ml/m  RA Volume:   68.60 ml  30.81 ml/m LA Vol (A4C): 46.9 ml  21.07 ml/m  AORTIC VALVE                     PULMONIC VALVE AV Area (Vmax):    2.32 cm      PV Vmax:       0.87 m/s AV Area (Vmean):   2.61 cm      PV Peak grad:  3.0 mmHg AV Area (VTI):     2.58 cm AV Vmax:           176.00 cm/s AV Vmean:          104.000 cm/s AV VTI:            0.262 m AV Peak Grad:      12.4 mmHg AV Mean Grad:      6.0 mmHg LVOT Vmax:         118.00 cm/s LVOT Vmean:        78.300 cm/s LVOT VTI:          0.195 m LVOT/AV VTI ratio: 0.74  AORTA Ao Root diam: 3.30 cm MITRAL VALVE                TRICUSPID VALVE MV Area (PHT): 4.21 cm     TR Peak grad:   25.4 mmHg MV Area VTI:   3.50 cm     TR Vmax:        252.00 cm/s MV Peak grad:  6.2 mmHg MV Mean grad:  3.0 mmHg     SHUNTS MV Vmax:       1.24 m/s     Systemic VTI:  0.20 m MV Vmean:      73.3 cm/s    Systemic Diam: 2.10 cm MV Decel Time: 180 msec MV E velocity: 109.00 cm/s Oswaldo Milian MD Electronically signed by Oswaldo Milian MD Signature Date/Time: 01/16/2023/1:23:23 PM    Final    DG CHEST PORT 1 VIEW  Result Date: 01/16/2023 CLINICAL DATA:  Sepsis. EXAM: PORTABLE CHEST 1 VIEW COMPARISON:  Chest x-ray  January 14, 2023 FINDINGS: Opacity remains in the periphery of the right lung base, mildly improved in the interval. The cardiomediastinal silhouette is stable. No pneumothorax. No nodules or masses. No focal infiltrates. IMPRESSION: Opacity in the periphery of the right lung base is mildly improved in the interval. No other interval changes. Electronically Signed   By: Dorise Bullion III M.D.   On: 01/16/2023 08:57    Scheduled Meds:  aspirin EC  81 mg Oral q AM   atorvastatin  80 mg Oral QHS   calcium carbonate  1,500 mg Oral QHS   Chlorhexidine Gluconate Cloth  6 each Topical Q0600   famotidine  20 mg Oral QHS   heparin  5,000 Units Subcutaneous Q8H   midodrine  15 mg Oral TID WC   pantoprazole  40 mg Oral Q breakfast   tamsulosin  0.4 mg Oral QPM   Continuous Infusions:  sodium chloride 10 mL/hr at 01/16/23 0322    albumin human 60 mL/hr at 01/16/23 0322   amiodarone 60 mg/hr (01/16/23 1042)   cefTRIAXone (ROCEPHIN)  IV     metronidazole 500 mg (01/16/23 0543)     LOS: 6 days   Critical Care Procedure Note Authorized and Performed by: Murvin Natal MD  Total Critical Care time:  55 mins Due to a high probability of clinically significant, life threatening deterioration, the patient required my highest level of preparedness to intervene emergently and I personally spent this critical care time directly and personally managing the patient.  This critical care time included obtaining a history; examining the patient, pulse oximetry; ordering and review of studies; arranging urgent treatment with development of a management plan; evaluation of patient's response of treatment; frequent reassessment; and discussions with other providers.  This critical care time was performed to assess and manage the high probability of imminent and life threatening deterioration that could result in multi-organ failure.  It was exclusive of separately billable procedures and treating other patients and teaching time.    Irwin Brakeman, MD How to contact the [facility] Attending or Consulting provider [place] or covering provider during after hours [place], for this patient?  Check the care team in [facility] and look for a) attending/consulting TR[facility] provider listed and b) the [facility] team listed Log into [email or web address] and use [place]'s universal password to access. If you do not have the password, please contact the hospital operator. Locate the [facility] provider you are looking for under [facility] and page to a number that you can be directly reached. If you still have difficulty reaching the provider, please page the [facility] (Director on Call) for the Hospitalists listed on amion for assistance.  01/16/2023, 1:58 PM

## 2023-01-16 NOTE — Progress Notes (Signed)
    [facility] for Infectious Disease   Reason for visit: Follow up on fever  Interval History: remains febrile but clinically seems stable.  Changed to ceftriaxone at request of wife. Negative for DVT.  Lactate normal   Physical Exam: Constitutional:  Vitals:   01/16/23 1623 01/16/23 1630  BP:  (!) 85/63  Pulse: (!) 131   Resp: 17   Temp: 99.1 F (37.3 C)   SpO2: 98% 93%   No obvious source and patient is stable.  Continue with antibiotics.   Dr. Baxter Flattery back tomorrow.

## 2023-01-16 NOTE — Progress Notes (Signed)
*  PRELIMINARY RESULTS* Echocardiogram 2D Echocardiogram has been performed.  [name] 01/16/2023, 9:53 AM

## 2023-01-17 DIAGNOSIS — I771 Stricture of artery: Secondary | ICD-10-CM

## 2023-01-17 DIAGNOSIS — I950 Idiopathic hypotension: Secondary | ICD-10-CM | POA: Diagnosis not present

## 2023-01-17 DIAGNOSIS — A419 Sepsis, unspecified organism: Secondary | ICD-10-CM | POA: Diagnosis not present

## 2023-01-17 DIAGNOSIS — K219 Gastro-esophageal reflux disease without esophagitis: Secondary | ICD-10-CM | POA: Diagnosis not present

## 2023-01-17 DIAGNOSIS — I471 Supraventricular tachycardia, unspecified: Secondary | ICD-10-CM | POA: Diagnosis not present

## 2023-01-17 DIAGNOSIS — L98499 Non-pressure chronic ulcer of skin of other sites with unspecified severity: Secondary | ICD-10-CM

## 2023-01-17 LAB — CBC WITH DIFFERENTIAL/PLATELET
Abs Immature Granulocytes: 0.1 10*3/uL — ABNORMAL HIGH (ref 0.00–0.07)
Basophils Absolute: 0.1 10*3/uL (ref 0.0–0.1)
Basophils Relative: 0 %
Eosinophils Absolute: 1.2 10*3/uL — ABNORMAL HIGH (ref 0.0–0.5)
Eosinophils Relative: 8 %
HCT: 37.2 % — ABNORMAL LOW (ref 39.0–52.0)
Hemoglobin: 11.5 g/dL — ABNORMAL LOW (ref 13.0–17.0)
Immature Granulocytes: 1 %
Lymphocytes Relative: 8 %
Lymphs Abs: 1.1 10*3/uL (ref 0.7–4.0)
MCH: 28.8 pg (ref 26.0–34.0)
MCHC: 30.9 g/dL (ref 30.0–36.0)
MCV: 93.2 fL (ref 80.0–100.0)
Monocytes Absolute: 1.3 10*3/uL — ABNORMAL HIGH (ref 0.1–1.0)
Monocytes Relative: 9 %
Neutro Abs: 10.9 10*3/uL — ABNORMAL HIGH (ref 1.7–7.7)
Neutrophils Relative %: 74 %
Platelets: 269 10*3/uL (ref 150–400)
RBC: 3.99 MIL/uL — ABNORMAL LOW (ref 4.22–5.81)
RDW: 18 % — ABNORMAL HIGH (ref 11.5–15.5)
WBC: 14.7 10*3/uL — ABNORMAL HIGH (ref 4.0–10.5)
nRBC: 0 % (ref 0.0–0.2)

## 2023-01-17 LAB — RENAL FUNCTION PANEL
Albumin: 2.7 g/dL — ABNORMAL LOW (ref 3.5–5.0)
Anion gap: 19 — ABNORMAL HIGH (ref 5–15)
BUN: 33 mg/dL — ABNORMAL HIGH (ref 6–20)
CO2: 23 mmol/L (ref 22–32)
Calcium: 7.9 mg/dL — ABNORMAL LOW (ref 8.9–10.3)
Chloride: 90 mmol/L — ABNORMAL LOW (ref 98–111)
Creatinine, Ser: 10.61 mg/dL — ABNORMAL HIGH (ref 0.61–1.24)
GFR, Estimated: 5 mL/min — ABNORMAL LOW (ref >60)
Glucose, Bld: 92 mg/dL (ref 70–99)
Phosphorus: 3 mg/dL (ref 2.5–4.6)
Potassium: 3.9 mmol/L (ref 3.5–5.1)
Sodium: 132 mmol/L — ABNORMAL LOW (ref 135–145)

## 2023-01-17 MED ORDER — SACCHAROMYCES BOULARDII 250 MG PO CAPS
250.0000 mg | ORAL_CAPSULE | Freq: Two times a day (BID) | ORAL | Status: DC
  Administered 2023-01-17 – 2023-01-25 (×17): 250 mg via ORAL
  Filled 2023-01-17 (×17): qty 1

## 2023-01-17 NOTE — Progress Notes (Signed)
Patient had complained that the right wrist IV being painful which was saline locked. Skin noted to be a little pink above insertion site. Patient states he usually wears a brace on that wrist/hand to help hold wrist straight. New IV placed in right upper arm and IV to right wrist removed. Since removal, patient stated that the pain has subsided. Dr Wynetta Emery made aware of assessment.

## 2023-01-17 NOTE — Progress Notes (Signed)
PROGRESS NOTE    [name]  [id number] DOB: 1966-07-02 DOA: 01/10/2023 PCP: Gardiner Rhyme, MD  Brief Narrative:  [name] is a 57 y.o. male with medical history significant of atrial fibrillation (Watchman device implanted), CHF, ESRD on dialysis MWF, DVT of left lower extremity, arterial ulcer of RLE, GERD, HLD, and chronic hypotension on midodrine admitted on 2/26 for A-fib with RVR and soft BP, meeting SIRS criteria for sepsis.  Admitted to ICU at [facility].  Patient had multiple runs of SVT and Cardiology was consulted; recommended amiodarone drip.  Patient continued to have SVT runs intermittently. Nephrology consulted for HD and WOC consulted for care of arterial ulcer on RLE.   Started cefepime, metronidazole, and linezolid on admission, then narrowed to cefepime and linezolid 2/28, but refevered on 2/29.  New blood cultures were obtained on 2/29 due to refevering.  In parallel, WBC continued to trend up from admission through 3/1, then stabilized.  Infectious disease consulted and recommended TTE.  CXR did show some questionable right lung base pleural effusion versus consolidation consistent with possible pneumonia.  MRSA PCR negative and linezolid stopped.  Patient's wife insisted on discontinuing cefepime in favor of ceftriaxone.  Patient refevered again 3/3.  Problem List:   Principal Problem:   Sepsis ([place]) Active Problems:   Atrial fibrillation with rapid ventricular response (HCC)   GERD (gastroesophageal reflux disease)   ESRD on dialysis ([place])   Hypotension   Mixed hyperlipidemia   History of DVT (deep vein thrombosis)  Assessment and Plan: Septic shock Still no source of infection, all studies and labs negative, though considering ?pneumonia on CXR at right lung base.  CXR yesterday was improved.  Wife insisted on ceftriaxone yesterday, patient did refever once discontinued.  TTE unremarkable, considering TEE. - Continue cefepime and metronidazole;  discontinue linezolid in setting of negative MRSA PCR - Midodrine for hypotension, minimize norepi use - Blood cultures pending, no growth @ 4 days - WBC stably elevated, fevering daily - Consider starting arterial line when able for more accurate BP if still concerned about hypotension - Appreciate Infectious Disease recs   ESRD on hemodialysis MWF Tolerating HD, has AV fistula. - Appreciate Nephrology recs, notes:             - Renal labs daily             - Has secondary hyperPTH             - On iron and ESA for anemia  Hypotension, chronic - Continue high dose midodrine on dialysis days (20 mg); 15 mg on non HD days - He has been weaned off norepinephrine and will try not to restart this unless absolutely needed  - Continue to monitor  PSVT Paroxysmal atrial fibrillation Currently on amiodarone gtt per cardiology, no plan for DCCV yet.  S/p Watchman device, not a candidate for chronic anticoagulation, discontinued May 2022 due to GI bleed. - Cardiac tele - Cardiology following closely, appreciate recs: - Holding metoprolol and diltiazem in setting of hypotension - Amiodarone rebolus daily, continue gtt - When rhythm stable, transition to oral amiodarone 200 mg BID for 3 weeks, then 200 mg daily   Arterial ulcer of RLE Does not appear infected, unlikely source of infection for sepsis.  Healing poorly and likely needs vascular surgery outpatient visit. - Wound care per [place] nurse recs - Minimize use of norepinephrine, concern for vasoconstriction in area of poor circulation   GERD - Continue famotidine, added omeprazole per  patient request yesterday; H2B also important for stress ulcer PPx best practice   Mixed hyperlipidemia - Continue statin   History of DVT History of DVT in the left lower extremity, finish 90 days of Eliquis.  Complained of left leg swelling, repeat venous Doppler on 01/11/2023 without acute DVT. - Continue to monitor  OSA - CPAP nightly   Skin  Assessment: I have examined the patient's skin and I agree with the wound assessment as performed by the wound care RN as outlined below:   Wound type: full thickness, s/p debridement by plastics 11/30/2022  Pressure Injury POA: NA, not pressure related  Measurement: see nursing flowsheets, last measurements by plastics 8 cms x 6.25 cms  Wound bed: 100% red moist, appears to be tendon showing at superior portion of wound  Drainage (amount, consistency, odor)  see nursing flowsheet  Periwound: intact  Dressing procedure/placement/frequency: Clean L anterior ankle wound with NS, apply Silver Hydrofiber Kellie Simmering [phone]) cut to fit wound bed daily. Cover with dry gauze and  secure with Kerlix and 4"  Ace wrap Kellie Simmering [phone]).  May need to moisten silver with NS if stuck to wound bed at dressing changes.  DVT prophylaxis: [place] heparin Code Status: Full Family Communication: Wife 3/3 on phone Disposition Plan: Patient is from: Home Anticipated d/c is to: Home Anticipated d/c date is: Pending clinical improvement Patient currently: Pending fever workup Admission Status is: Inpatient Remains inpatient appropriate because: Critical illness and persistent febrile illness  Consultants:  Infectious Disease Cardiology Nephrology WOC   Procedures:  DVT US 2/27 unremarkable   Antimicrobials:  Metronidazole (2/27>__) Ceftriaxone (3/3>__) Cefepime (2/27>3/3) Linezolid (2/27>3/2)  Subjective: Patient seen and evaluated today with no new acute complaints or concerns. No acute concerns or events noted overnight.  This morning, patient is off norepi, still on amiodarone drip.  He feels well, though still notes significant fatigue.  Slept well overnight.  Denies chest pain, shortness of breath.  Was n.p.o. overnight for possible DCCV.  Objective: Vitals:   01/17/23 0630 01/17/23 0700 01/17/23 0716 01/17/23 0730  BP: (!) 82/58 (!) 81/51  99/65  Pulse:      Resp: 15 (!) '22 16 18  '$ Temp:    98.5 F (36.9 C)   TempSrc:   Oral   SpO2:   98%   Weight:      Height:        Intake/Output Summary (Last 24 hours) at 01/17/2023 0840 Last data filed at 01/17/2023 0520 Gross per 24 hour  Intake 1690.04 ml  Output --  Net 1690.04 ml   Filed Weights   01/15/23 0500 01/15/23 1115 01/15/23 1547  Weight: 100.7 kg 100.1 kg 98.3 kg    Examination: General: Adult male resting in bed.  No acute distress. HEENT: Moist mucous membranes.  No scleral icterus. Lungs: Light left basilar crackles.  Otherwise clear to auscultation bilaterally.  Normal work of breath on room air. Cardiovascular: Intermittently tachycardic, irregular rhythm.  Normal S1/2.  No murmurs/rubs/gallops. Abdomen: No tenderness to palpation.  Soft, nondistended. GU: Not examined. Extremities: Trace peripheral edema.  No cyanosis or clubbing.  Capillary refill less than 2 sites. Skin: Warm, dry.  No rashes grossly. Neuro: Alert and oriented x 4.  No focal neurological deficits.  Data Reviewed: I have personally reviewed following labs and imaging studies.  CBC: Recent Labs  Lab 01/10/23 1935 01/11/23 0517 01/12/23 1224 01/13/23 0421 01/14/23 0421 01/15/23 0430 01/16/23 0352 01/17/23 0318  WBC 11.6* 11.3*   < >  14.1* 17.2* 14.6* 13.8* 14.7*  NEUTROABS 7.3 7.2  --   --   --  10.6* 10.6* 10.9*  HGB 13.4 12.8*   < > 11.2* 11.8* 11.3* 11.4* 11.5*  HCT 45.3 44.2   < > 37.9* 41.0 37.8* 37.9* 37.2*  MCV 97.4 100.9*   < > 98.2 100.0 96.2 95.0 93.2  PLT 275 235   < > 255 274 245 252 269   < > = values in this interval not displayed.   Basic Metabolic Panel: Recent Labs  Lab 01/11/23 0517 01/12/23 1224 01/13/23 0421 01/14/23 0421 01/15/23 0430 01/16/23 0352 01/17/23 0318  NA 137 136 133* 135 131* 132* 132*  K 4.2 4.3 3.7 4.2 3.9 4.1 3.9  CL 100 97* 95* 93* 92* 91* 90*  CO2 '23 24 26 24 22 25 23  '$ GLUCOSE 83 124* 91 146* 115* 84 92  BUN 37* 49* 32* 38* 41* 24* 33*  CREATININE 10.06* 12.71* 9.75* 11.75*  13.15* 8.95* 10.61*  CALCIUM 8.2* 8.1* 7.9* 8.0* 7.6* 7.7* 7.9*  MG 2.1 2.2  --   --   --  1.7  --   PHOS  --   --   --   --  3.2 2.4* 3.0   GFR: Estimated Creatinine Clearance: 9.8 mL/min (A) (by C-G formula based on SCr of 10.61 mg/dL (H)). Liver Function Tests: Recent Labs  Lab 01/10/23 1935 01/11/23 0517 01/15/23 0430 01/16/23 0352 01/17/23 0318  AST 35 24 24  --   --   ALT '23 16 7  '$ --   --   ALKPHOS 182* 151* 204*  --   --   BILITOT 0.7 0.8 0.6  --   --   PROT 8.2* 7.0 6.0*  --   --   ALBUMIN 3.3* 2.8* 2.2*  2.2* 2.5* 2.7*   No results for input(s): "LIPASE", "AMYLASE" in the last 168 hours. No results for input(s): "AMMONIA" in the last 168 hours. Coagulation Profile: Recent Labs  Lab 01/10/23 1935  INR 1.1   Cardiac Enzymes: No results for input(s): "CKTOTAL", "CKMB", "CKMBINDEX", "TROPONINI" in the last 168 hours. BNP (last 3 results) No results for input(s): "PROBNP" in the last 8760 hours. HbA1C: No results for input(s): "HGBA1C" in the last 72 hours. CBG: Recent Labs  Lab 01/11/23 2109  GLUCAP 96   Lipid Profile: No results for input(s): "CHOL", "HDL", "LDLCALC", "TRIG", "CHOLHDL", "LDLDIRECT" in the last 72 hours. Thyroid Function Tests: No results for input(s): "TSH", "T4TOTAL", "FREET4", "T3FREE", "THYROIDAB" in the last 72 hours. Anemia Panel: No results for input(s): "VITAMINB12", "FOLATE", "FERRITIN", "TIBC", "IRON", "RETICCTPCT" in the last 72 hours. Sepsis Labs: Recent Labs  Lab 01/10/23 2125 01/11/23 0517 01/12/23 0700 01/13/23 1606 01/13/23 1742 01/14/23 1318 01/15/23 0430 01/16/23 0352  PROCALCITON  --   --  5.42  --   --  6.64 11.08 15.70  LATICACIDVEN 1.9 1.2  --  3.4* 1.5  --   --   --     Recent Results (from the past 240 hour(s))  Culture, blood (Routine X 2) w Reflex to ID Panel     Status: None   Collection Time: 01/10/23  7:00 PM   Specimen: BLOOD RIGHT ARM  Result Value Ref Range Status   Specimen Description BLOOD  RIGHT ARM  Final   Special Requests   Final    BOTTLES DRAWN AEROBIC AND ANAEROBIC Blood Culture adequate volume   Culture   Final    NO GROWTH 5 DAYS  Performed at [facility], [address] [place], [place] [address]    Report Status 01/15/2023 FINAL  Final  Culture, blood (Routine x 2)     Status: None   Collection Time: 01/10/23  7:35 PM   Specimen: BLOOD  Result Value Ref Range Status   Specimen Description BLOOD BLOOD RIGHT WRIST  Final   Special Requests   Final    BOTTLES DRAWN AEROBIC AND ANAEROBIC Blood Culture adequate volume   Culture   Final    NO GROWTH 5 DAYS Performed at [facility], [address], [place], [place] [address]    Report Status 01/15/2023 FINAL  Final  Resp panel by RT-PCR (RSV, Flu A&B, Covid) Anterior Nasal Swab     Status: None   Collection Time: 01/10/23  8:18 PM   Specimen: Anterior Nasal Swab  Result Value Ref Range Status   SARS Coronavirus 2 by RT PCR NEGATIVE NEGATIVE Final    Comment: (NOTE) SARS-CoV-2 target nucleic acids are NOT DETECTED.  The SARS-CoV-2 RNA is generally detectable in upper respiratory specimens during the acute phase of infection. The lowest concentration of SARS-CoV-2 viral copies this assay can detect is 138 copies/mL. A negative result does not preclude SARS-Cov-2 infection and should not be used as the sole basis for treatment or other patient management decisions. A negative result may occur with  improper specimen collection/handling, submission of specimen other than nasopharyngeal swab, presence of viral mutation(s) within the areas targeted by this assay, and inadequate number of viral copies(<138 copies/mL). A negative result must be combined with clinical observations, patient history, and epidemiological information. The expected result is Negative.  Fact Sheet for Patients:  EntrepreneurPulse.com.au  Fact Sheet for Healthcare Providers:   IncredibleEmployment.be  This test is no t yet approved or cleared by the [place] FDA and  has been authorized for detection and/or diagnosis of SARS-CoV-2 by FDA under an Emergency Use Authorization (EUA). This EUA will remain  in effect (meaning this test can be used) for the duration of the COVID-19 declaration under Section 564(b)(1) of the Act, 21 U.S.C.section 360bbb-3(b)(1), unless the authorization is terminated  or revoked sooner.       Influenza A by PCR NEGATIVE NEGATIVE Final   Influenza B by PCR NEGATIVE NEGATIVE Final    Comment: (NOTE) The Xpert Xpress SARS-CoV-2/FLU/RSV plus assay is intended as an aid in the diagnosis of influenza from Nasopharyngeal swab specimens and should not be used as a sole basis for treatment. Nasal washings and aspirates are unacceptable for Xpert Xpress SARS-CoV-2/FLU/RSV testing.  Fact Sheet for Patients: EntrepreneurPulse.com.au  Fact Sheet for Healthcare Providers: IncredibleEmployment.be  This test is not yet approved or cleared by the [place] FDA and has been authorized for detection and/or diagnosis of SARS-CoV-2 by FDA under an Emergency Use Authorization (EUA). This EUA will remain in effect (meaning this test can be used) for the duration of the COVID-19 declaration under Section 564(b)(1) of the Act, 21 U.S.C. section 360bbb-3(b)(1), unless the authorization is terminated or revoked.     Resp Syncytial Virus by PCR NEGATIVE NEGATIVE Final    Comment: (NOTE) Fact Sheet for Patients: EntrepreneurPulse.com.au  Fact Sheet for Healthcare Providers: IncredibleEmployment.be  This test is not yet approved or cleared by the [place] FDA and has been authorized for detection and/or diagnosis of SARS-CoV-2 by FDA under an Emergency Use Authorization (EUA). This EUA will remain in effect (meaning this test can be used) for  the duration of the COVID-19  declaration under Section 564(b)(1) of the Act, 21 U.S.C. section 360bbb-3(b)(1), unless the authorization is terminated or revoked.  Performed at [facility], [address] [place], [place] [address]   MRSA Next Gen by PCR, Nasal     Status: None   Collection Time: 01/11/23  2:27 PM   Specimen: Nasal Mucosa; Nasal Swab  Result Value Ref Range Status   MRSA by PCR Next Gen NOT DETECTED NOT DETECTED Final    Comment: (NOTE) The GeneXpert MRSA Assay (FDA approved for NASAL specimens only), is one component of a comprehensive MRSA colonization surveillance program. It is not intended to diagnose MRSA infection nor to guide or monitor treatment for MRSA infections. Test performance is not FDA approved in patients less than 24 years old. Performed at [facility], [address] [place], [place] [address]   Culture, blood (Routine X 2) w Reflex to ID Panel     Status: None (Preliminary result)   Collection Time: 01/13/23  5:42 PM   Specimen: Right Antecubital; Blood  Result Value Ref Range Status   Specimen Description RIGHT ANTECUBITAL  Final   Special Requests   Final    BOTTLES DRAWN AEROBIC AND ANAEROBIC Blood Culture adequate volume   Culture   Final    NO GROWTH 4 DAYS Performed at [facility], [address] [place], [place] [address]    Report Status PENDING  Incomplete  Culture, blood (Routine X 2) w Reflex to ID Panel     Status: None (Preliminary result)   Collection Time: 01/13/23  5:42 PM   Specimen: BLOOD RIGHT HAND  Result Value Ref Range Status   Specimen Description BLOOD RIGHT HAND  Final   Special Requests   Final    BOTTLES DRAWN AEROBIC AND ANAEROBIC Blood Culture adequate volume   Culture   Final    NO GROWTH 4 DAYS Performed at [facility], [address] [place], [place] [address]    Report Status PENDING  Incomplete     Radiology Studies: ECHOCARDIOGRAM COMPLETE  Result Date: 01/16/2023    ECHOCARDIOGRAM REPORT    Patient Name:   [name] Date of Exam: 01/16/2023 Medical Rec #:  [id number]      Height:       73.0 in Accession #:    [id number]     Weight:       216.7 lb Date of Birth:  [birth year]      BSA:          2.226 m Patient Age:    57 years       BP:           81/27 mmHg Patient Gender: M              HR:           131 bpm. Exam Location:  [facility] Procedure: 2D Echo, Cardiac Doppler and Color Doppler Indications:    Fever  History:        Patient has prior history of Echocardiogram examinations, most                 recent 11/18/2016. CHF, Arrythmias:Atrial Fibrillation,                 Signs/Symptoms:Fever; Risk Factors:Dyslipidemia. ESRD on                 Dialysis.  Sonographer:    Wenda Low Referring Phys: [place]  1. Left ventricular ejection fraction,  by estimation, is 65 to 70%. The left ventricle has normal function. The left ventricle has no regional wall motion abnormalities. There is moderate asymmetric left ventricular hypertrophy of the basal-septal segment. Left ventricular diastolic parameters are indeterminate.  2. Right ventricular systolic function is mildly reduced. The right ventricular size is mildly enlarged. There is normal pulmonary artery systolic pressure. The estimated right ventricular systolic pressure is [id number] mmHg.  3. Right atrial size was mildly dilated.  4. The mitral valve is degenerative. Trivial mitral valve regurgitation.  5. The aortic valve was not well visualized. Aortic valve regurgitation is not visualized. Aortic valve sclerosis/calcification is present, without any evidence of aortic stenosis.  6. The inferior vena cava is dilated in size with >50% respiratory variability, suggesting right atrial pressure of 8 mmHg. FINDINGS  Left Ventricle: Left ventricular ejection fraction, by estimation, is 65 to 70%. The left ventricle has normal function. The left ventricle has no regional wall motion abnormalities. The left ventricular internal  cavity size was small. There is moderate  asymmetric left ventricular hypertrophy of the basal-septal segment. Left ventricular diastolic parameters are indeterminate. Right Ventricle: The right ventricular size is mildly enlarged. Right vetricular wall thickness was not well visualized. Right ventricular systolic function is mildly reduced. There is normal pulmonary artery systolic pressure. The tricuspid regurgitant velocity is 2.52 m/s, and with an assumed right atrial pressure of 8 mmHg, the estimated right ventricular systolic pressure is [id number] mmHg. Left Atrium: Left atrial size was normal in size. Right Atrium: Right atrial size was mildly dilated. Pericardium: There is no evidence of pericardial effusion. Mitral Valve: The mitral valve is degenerative in appearance. Trivial mitral valve regurgitation. MV peak gradient, 6.2 mmHg. The mean mitral valve gradient is 3.0 mmHg. Tricuspid Valve: The tricuspid valve is normal in structure. Tricuspid valve regurgitation is trivial. Aortic Valve: The aortic valve was not well visualized. Aortic valve regurgitation is not visualized. Aortic valve sclerosis/calcification is present, without any evidence of aortic stenosis. Aortic valve mean gradient measures 6.0 mmHg. Aortic valve peak gradient measures 12.4 mmHg. Aortic valve area, by VTI measures 2.58 cm. Pulmonic Valve: The pulmonic valve was not well visualized. Pulmonic valve regurgitation is not visualized. Aorta: The aortic root is normal in size and structure. Venous: The inferior vena cava is dilated in size with greater than 50% respiratory variability, suggesting right atrial pressure of 8 mmHg. IAS/Shunts: No atrial level shunt detected by color flow Doppler.  LEFT VENTRICLE PLAX 2D LVIDd:         4.20 cm   Diastology LVIDs:         2.80 cm   LV e' medial:    7.51 cm/s LV PW:         1.40 cm   LV E/e' medial:  14.5 LV IVS:        1.40 cm   LV e' lateral:   7.62 cm/s LVOT diam:     2.10 cm   LV E/e' lateral:  14.3 LV SV:         68 LV SV Index:   30 LVOT Area:     3.46 cm  RIGHT VENTRICLE RV Basal diam:  3.95 cm RV Mid diam:    3.10 cm RV S prime:     13.10 cm/s LEFT ATRIUM           Index        RIGHT ATRIUM           Index LA diam:  4.40 cm 1.98 cm/m   RA Area:     22.90 cm LA Vol (A2C): 47.0 ml 21.11 ml/m  RA Volume:   68.60 ml  30.81 ml/m LA Vol (A4C): 46.9 ml 21.07 ml/m  AORTIC VALVE                     PULMONIC VALVE AV Area (Vmax):    2.32 cm      PV Vmax:       0.87 m/s AV Area (Vmean):   2.61 cm      PV Peak grad:  3.0 mmHg AV Area (VTI):     2.58 cm AV Vmax:           176.00 cm/s AV Vmean:          104.000 cm/s AV VTI:            0.262 m AV Peak Grad:      12.4 mmHg AV Mean Grad:      6.0 mmHg LVOT Vmax:         118.00 cm/s LVOT Vmean:        78.300 cm/s LVOT VTI:          0.195 m LVOT/AV VTI ratio: 0.74  AORTA Ao Root diam: 3.30 cm MITRAL VALVE                TRICUSPID VALVE MV Area (PHT): 4.21 cm     TR Peak grad:   25.4 mmHg MV Area VTI:   3.50 cm     TR Vmax:        252.00 cm/s MV Peak grad:  6.2 mmHg MV Mean grad:  3.0 mmHg     SHUNTS MV Vmax:       1.24 m/s     Systemic VTI:  0.20 m MV Vmean:      73.3 cm/s    Systemic Diam: 2.10 cm MV Decel Time: 180 msec MV E velocity: 109.00 cm/s Oswaldo Milian MD Electronically signed by Oswaldo Milian MD Signature Date/Time: 01/16/2023/1:23:23 PM    Final    DG CHEST PORT 1 VIEW  Result Date: 01/16/2023 CLINICAL DATA:  Sepsis. EXAM: PORTABLE CHEST 1 VIEW COMPARISON:  Chest x-ray January 14, 2023 FINDINGS: Opacity remains in the periphery of the right lung base, mildly improved in the interval. The cardiomediastinal silhouette is stable. No pneumothorax. No nodules or masses. No focal infiltrates. IMPRESSION: Opacity in the periphery of the right lung base is mildly improved in the interval. No other interval changes. Electronically Signed   By: Dorise Bullion III M.D.   On: 01/16/2023 08:57    Scheduled Meds:  aspirin EC  81 mg Oral q AM    atorvastatin  80 mg Oral QHS   calcium carbonate  1,500 mg Oral QHS   Chlorhexidine Gluconate Cloth  6 each Topical Q0600   famotidine  20 mg Oral QHS   heparin  5,000 Units Subcutaneous Q8H   midodrine  15 mg Oral TID WC   pantoprazole  40 mg Oral Q breakfast   tamsulosin  0.4 mg Oral QPM   Continuous Infusions:  sodium chloride 10 mL/hr at 01/17/23 0520   albumin human 60 mL/hr at 01/17/23 0520   amiodarone 60 mg/hr (01/17/23 0520)   cefTRIAXone (ROCEPHIN)  IV Stopped (01/16/23 1521)   metronidazole 500 mg (01/17/23 0520)     LOS: 7 days   Time spent: 35 minutes  Cherita Hebel Katy Fitch, MS4 [facility]  If 7PM-7AM, please contact night-coverage [email or web address] 01/17/2023, 8:40 AM

## 2023-01-17 NOTE — Progress Notes (Addendum)
Patient Name: [name] Date of Encounter: 01/17/2023 [place] Cardiologist: None   Interval Summary   Patient seen on AM rounds. Denies any chest pain, shortness of breath, or palpitations. Over the weekend noted to have heart rates in the 120-130's. Continued on amiodarone drip. This morning heart rates have improved to 80-90's.   Vital Signs   Vitals:   01/17/23 0600 01/17/23 0630 01/17/23 0700 01/17/23 0716  BP: (!) 89/59 (!) 82/58 (!) 81/51   Pulse: 99     Resp: 14 15 (!) 22 16  Temp:    98.5 F (36.9 C)  TempSrc:    Oral  SpO2: 94%   98%  Weight:      Height:        Intake/Output Summary (Last 24 hours) at 01/17/2023 0747 Last data filed at 01/17/2023 0520 Gross per 24 hour  Intake 1690.04 ml  Output --  Net 1690.04 ml      01/15/2023    3:47 PM 01/15/2023   11:15 AM 01/15/2023    5:00 AM  Last 3 Weights  Weight (lbs) 216 lb 11.4 oz 220 lb 10.9 oz 222 lb 0.1 oz  Weight (kg) 98.3 kg 100.1 kg 100.7 kg      Telemetry/ECG    Rate controlled atrial fibrillation with rates in the 80-90's - Personally Reviewed  Physical Exam  GEN: No acute distress.   Neck: No JVD Cardiac: irregularly irregular, no murmurs, rubs, or gallops.  Respiratory: Clear to auscultation bilaterally.Respirations are unlabored on room air GI: Soft, nontender, non-distended  MS: trace edema, ace wrap to the left lower leg  Assessment & Plan    PSVT -remains on amiodarone drip at 60 mg/hr decrease dose to '30mg'$ /hr -rates have improved this morning in the 80-90's -if SVT remains quiescent throughout the day can transition to oral amiodarone 200 mg bid x 3 weeks then 200 mg daily -no plans for DCCV today -continue cardiac monitor  Paroxysmal atrial fibrillation -longstanding history of PAF -previous anticoagulated with Warfarin and then Eliquis, developed a GI bleed and underwent watchman 08/2021 -had been managed on asa and low-dose metoprolol, PTA metoprolol remains on hold  due to soft blood pressures -remains in rate controlled atrial fibrillation this morning  CAD s/p PCI/DES to the LAD 2022 -denies any chest pain -continued on asa and statin -restart home metoprolol with blood pressures allows -continue telemetry monitoring -EKG as needed for pain or changes  ESRD -s/p remote transplant -currently on HD -management per Nephrology  Hyperlipidemia -continued on atorvastatin  Hypotension -blood pressure 81/51 -continued on midodrine 15 mg tid -vitals per unit protocol  History of LLE DVT -previously treated with Eliquis for three months -diagnosed 08/2022   For questions or updates, please contact [place] Please consult [email or web address] for contact info under        Signed, SHERI HAMMOCK, NP    Attending note Patient seen and disucssed with NP Hammock, I agree with her documentation.    1.PSVT - episodes of PSVT in setting of sepsis, systemic illness - chronic hypotension on home midodrine  limits av nodal agent dosing - started on IV amiodarone. Likely can plan for short course as SVT likely to improve as systemic illness resolves - remains on amio gtt at '30mg'$ /hr, several amio boluses  -rates should improve as sepsis resolves, still febile at times Tm 101.2 - continue amio gtt today, if rates remain stable then transition to oral tomorrow   2. Afib - history  of watchman device, prior issues with GI bleeding - starting IV amio for SVT, likely as outpatient can settle back into his prior regimen of just low dose lopressor - his afib rates have not been an issue, his tachycardia is related to intermittent PSVT   3.ESRD - per neprhology   4. Chronic hypotension - on midodrine at home.   5. Sepsis - per primary team   Carlyle Dolly MD

## 2023-01-17 NOTE — Procedures (Signed)
   Nephrology Nursing Note:  Normally on MWF HD schedule however was last dialyzed Saturday 01/15/23.  High census today and pt is last on the list.  Pre-HD BP 77/56 and HR 133.  On Amiodarone '30mg'$ /h infusion.    Per Dr. Moshe Cipro, we will r/s HD for tomorrow morning.  Room air spO2 97%.  Pt is in agreement.   Rockwell Alexandria, RN AP [facility]

## 2023-01-17 NOTE — Progress Notes (Signed)
Subjective:  Had HD Sat instead of Friday due to emergency-  removed 2 liters-  had high HR during-  HR has been ok the rest of weekend on amio drip -  he continues to look great  Objective Vital signs in last 24 hours: Vitals:   01/17/23 0700 01/17/23 0716 01/17/23 0730 01/17/23 0830  BP: (!) 81/51  99/65   Pulse:      Resp: (!) '22 16 18   '$ Temp:  98.5 F (36.9 C)    TempSrc:  Oral    SpO2:  98%  95%  Weight:      Height:       Weight change:   Intake/Output Summary (Last 24 hours) at 01/17/2023 [id number] Last data filed at 01/17/2023 0845 Gross per 24 hour  Intake 1904.09 ml  Output --  Net 1904.09 ml   HD orders at [facility]  MWF 3 hours 45 min using AVF- 15 gauge needles 400 BFR/500 DFR- 2/2.5 bath,  temp 36.5-  gets heparin 1000 load, then 200 per hour, also on venofer 50 per week  EDW 94.5   Assessment/ Plan: Pt is a 57 y.o. yo male with ESRD who was admitted on 01/10/2023 with fever, afib and hypotension  Assessment/Plan: 1. Fever-  suspicious for sepsis-  placed on broad spectrum abx-  now only ceftriaxone, id following-  cultures negative so far-  fever resolved 2. ESRD - normally MWF via AVF -  planning for routine HD today - will minimize UF given heart and BP issues 3. Anemia-  does not appear to be an issue-  on iron and no ESA as OP 4. Secondary hyperparathyroidism-  not on active vit D as OP-  seems to be on calcium supp-  no binder listed on meds, he confirms no binder-  will check phos 5. HTN/volume- in afib and hypotensive-  his reg dose of midodrine has been increased to hopefully avoid pressors here.  fluid removal as tolerated with HD  -   he says he takes as much as 35 mg of midodrine on dialysis days-  pt seems knowledgeable about what he can handle volume removal wise and what he can't-   pulling only a little today [name]    Labs: Basic Metabolic Panel: Recent Labs  Lab 01/15/23 0430 01/16/23 0352 01/17/23 0318  NA 131* 132* 132*   K 3.9 4.1 3.9  CL 92* 91* 90*  CO2 '22 25 23  '$ GLUCOSE 115* 84 92  BUN 41* 24* 33*  CREATININE 13.15* 8.95* 10.61*  CALCIUM 7.6* 7.7* 7.9*  PHOS 3.2 2.4* 3.0   Liver Function Tests: Recent Labs  Lab 01/10/23 1935 01/11/23 0517 01/15/23 0430 01/16/23 0352 01/17/23 0318  AST 35 24 24  --   --   ALT '23 16 7  '$ --   --   ALKPHOS 182* 151* 204*  --   --   BILITOT 0.7 0.8 0.6  --   --   PROT 8.2* 7.0 6.0*  --   --   ALBUMIN 3.3* 2.8* 2.2*  2.2* 2.5* 2.7*   No results for input(s): "LIPASE", "AMYLASE" in the last 168 hours. No results for input(s): "AMMONIA" in the last 168 hours. CBC: Recent Labs  Lab 01/13/23 0421 01/14/23 0421 01/15/23 0430 01/16/23 0352 01/17/23 0318  WBC 14.1* 17.2* 14.6* 13.8* 14.7*  NEUTROABS  --   --  10.6* 10.6* 10.9*  HGB 11.2* 11.8* 11.3* 11.4* 11.5*  HCT 37.9* 41.0 37.8* 37.9* 37.2*  MCV 98.2 100.0 96.2 95.0 93.2  PLT 255 274 245 252 269   Cardiac Enzymes: No results for input(s): "CKTOTAL", "CKMB", "CKMBINDEX", "TROPONINI" in the last 168 hours. CBG: Recent Labs  Lab 01/11/23 2109  GLUCAP 96    Iron Studies: No results for input(s): "IRON", "TIBC", "TRANSFERRIN", "FERRITIN" in the last 72 hours. Studies/Results: ECHOCARDIOGRAM COMPLETE  Result Date: 01/16/2023    ECHOCARDIOGRAM REPORT   Patient Name:   [facility] Date of Exam: 01/16/2023 Medical Rec #:  [id number]      Height:       73.0 in Accession #:    [id number]     Weight:       216.7 lb Date of Birth:  [birth year]      BSA:          2.226 m Patient Age:    57 years       BP:           81/27 mmHg Patient Gender: M              HR:           131 bpm. Exam Location:  [facility] Procedure: 2D Echo, Cardiac Doppler and Color Doppler Indications:    Fever  History:        Patient has prior history of Echocardiogram examinations, most                 recent 11/18/2016. CHF, Arrythmias:Atrial Fibrillation,                 Signs/Symptoms:Fever; Risk Factors:Dyslipidemia. ESRD on                  Dialysis.  Sonographer:    Wenda Low Referring Phys: [place]  1. Left ventricular ejection fraction, by estimation, is 65 to 70%. The left ventricle has normal function. The left ventricle has no regional wall motion abnormalities. There is moderate asymmetric left ventricular hypertrophy of the basal-septal segment. Left ventricular diastolic parameters are indeterminate.  2. Right ventricular systolic function is mildly reduced. The right ventricular size is mildly enlarged. There is normal pulmonary artery systolic pressure. The estimated right ventricular systolic pressure is [id number] mmHg.  3. Right atrial size was mildly dilated.  4. The mitral valve is degenerative. Trivial mitral valve regurgitation.  5. The aortic valve was not well visualized. Aortic valve regurgitation is not visualized. Aortic valve sclerosis/calcification is present, without any evidence of aortic stenosis.  6. The inferior vena cava is dilated in size with >50% respiratory variability, suggesting right atrial pressure of 8 mmHg. FINDINGS  Left Ventricle: Left ventricular ejection fraction, by estimation, is 65 to 70%. The left ventricle has normal function. The left ventricle has no regional wall motion abnormalities. The left ventricular internal cavity size was small. There is moderate  asymmetric left ventricular hypertrophy of the basal-septal segment. Left ventricular diastolic parameters are indeterminate. Right Ventricle: The right ventricular size is mildly enlarged. Right vetricular wall thickness was not well visualized. Right ventricular systolic function is mildly reduced. There is normal pulmonary artery systolic pressure. The tricuspid regurgitant velocity is 2.52 m/s, and with an assumed right atrial pressure of 8 mmHg, the estimated right ventricular systolic pressure is [id number] mmHg. Left Atrium: Left atrial size was normal in size. Right Atrium: Right atrial size was mildly dilated.  Pericardium: There is no evidence of pericardial effusion. Mitral Valve: The mitral valve is degenerative  in appearance. Trivial mitral valve regurgitation. MV peak gradient, 6.2 mmHg. The mean mitral valve gradient is 3.0 mmHg. Tricuspid Valve: The tricuspid valve is normal in structure. Tricuspid valve regurgitation is trivial. Aortic Valve: The aortic valve was not well visualized. Aortic valve regurgitation is not visualized. Aortic valve sclerosis/calcification is present, without any evidence of aortic stenosis. Aortic valve mean gradient measures 6.0 mmHg. Aortic valve peak gradient measures 12.4 mmHg. Aortic valve area, by VTI measures 2.58 cm. Pulmonic Valve: The pulmonic valve was not well visualized. Pulmonic valve regurgitation is not visualized. Aorta: The aortic root is normal in size and structure. Venous: The inferior vena cava is dilated in size with greater than 50% respiratory variability, suggesting right atrial pressure of 8 mmHg. IAS/Shunts: No atrial level shunt detected by color flow Doppler.  LEFT VENTRICLE PLAX 2D LVIDd:         4.20 cm   Diastology LVIDs:         2.80 cm   LV e' medial:    7.51 cm/s LV PW:         1.40 cm   LV E/e' medial:  14.5 LV IVS:        1.40 cm   LV e' lateral:   7.62 cm/s LVOT diam:     2.10 cm   LV E/e' lateral: 14.3 LV SV:         68 LV SV Index:   30 LVOT Area:     3.46 cm  RIGHT VENTRICLE RV Basal diam:  3.95 cm RV Mid diam:    3.10 cm RV S prime:     13.10 cm/s LEFT ATRIUM           Index        RIGHT ATRIUM           Index LA diam:      4.40 cm 1.98 cm/m   RA Area:     22.90 cm LA Vol (A2C): 47.0 ml 21.11 ml/m  RA Volume:   68.60 ml  30.81 ml/m LA Vol (A4C): 46.9 ml 21.07 ml/m  AORTIC VALVE                     PULMONIC VALVE AV Area (Vmax):    2.32 cm      PV Vmax:       0.87 m/s AV Area (Vmean):   2.61 cm      PV Peak grad:  3.0 mmHg AV Area (VTI):     2.58 cm AV Vmax:           176.00 cm/s AV Vmean:          104.000 cm/s AV VTI:            0.262 m  AV Peak Grad:      12.4 mmHg AV Mean Grad:      6.0 mmHg LVOT Vmax:         118.00 cm/s LVOT Vmean:        78.300 cm/s LVOT VTI:          0.195 m LVOT/AV VTI ratio: 0.74  AORTA Ao Root diam: 3.30 cm MITRAL VALVE                TRICUSPID VALVE MV Area (PHT): 4.21 cm     TR Peak grad:   25.4 mmHg MV Area VTI:   3.50 cm     TR Vmax:  252.00 cm/s MV Peak grad:  6.2 mmHg MV Mean grad:  3.0 mmHg     SHUNTS MV Vmax:       1.24 m/s     Systemic VTI:  0.20 m MV Vmean:      73.3 cm/s    Systemic Diam: 2.10 cm MV Decel Time: 180 msec MV E velocity: 109.00 cm/s Oswaldo Milian MD Electronically signed by Oswaldo Milian MD Signature Date/Time: 01/16/2023/1:23:23 PM    Final    DG CHEST PORT 1 VIEW  Result Date: 01/16/2023 CLINICAL DATA:  Sepsis. EXAM: PORTABLE CHEST 1 VIEW COMPARISON:  Chest x-ray January 14, 2023 FINDINGS: Opacity remains in the periphery of the right lung base, mildly improved in the interval. The cardiomediastinal silhouette is stable. No pneumothorax. No nodules or masses. No focal infiltrates. IMPRESSION: Opacity in the periphery of the right lung base is mildly improved in the interval. No other interval changes. Electronically Signed   By: Dorise Bullion III M.D.   On: 01/16/2023 08:57   Medications: Infusions:  sodium chloride 10 mL/hr at 01/17/23 0845   albumin human 60 mL/hr at 01/17/23 0845   amiodarone 30 mg/hr (01/17/23 0845)   cefTRIAXone (ROCEPHIN)  IV Stopped (01/16/23 1521)   metronidazole Stopped (01/17/23 [id number])    Scheduled Medications:  aspirin EC  81 mg Oral q AM   atorvastatin  80 mg Oral QHS   calcium carbonate  1,500 mg Oral QHS   Chlorhexidine Gluconate Cloth  6 each Topical Q0600   famotidine  20 mg Oral QHS   heparin  5,000 Units Subcutaneous Q8H   midodrine  15 mg Oral TID WC   pantoprazole  40 mg Oral Q breakfast   tamsulosin  0.4 mg Oral QPM    have reviewed scheduled and prn medications.  Physical Exam: General:   NAD Heart:irreg/tachy Lungs: mostly clear Abdomen: soft, non tender Extremities: no peripheral edema Dialysis Access: left forearm AVF-  no signs of infection    01/17/2023,9:07 AM  LOS: 7 days

## 2023-01-17 NOTE — Progress Notes (Signed)
Patient alert and oriented throughout the shift, Blood pressure running soft at times, Dr. Dallie Dad made aware of different blood pressures, got some boluses and  Albumin ordered, patient asymptomatic and slept well throughout the night in CPAP without the oxygen, patient NPO after midnight for possible cardioversion ,  patient reverted back from sinus Tach (130s)- 90s,Afib, irregular rate and rhythm, dressed his wound as per wound care instructions, will continue to monitor, endorsed to oncoming RN.

## 2023-01-18 ENCOUNTER — Inpatient Hospital Stay (HOSPITAL_COMMUNITY): Payer: Medicare Other

## 2023-01-18 ENCOUNTER — Ambulatory Visit: Admit: 2023-01-18 | Payer: Medicare Other | Admitting: Plastic Surgery

## 2023-01-18 DIAGNOSIS — I4819 Other persistent atrial fibrillation: Secondary | ICD-10-CM | POA: Diagnosis not present

## 2023-01-18 DIAGNOSIS — N186 End stage renal disease: Secondary | ICD-10-CM | POA: Diagnosis not present

## 2023-01-18 DIAGNOSIS — A419 Sepsis, unspecified organism: Secondary | ICD-10-CM | POA: Diagnosis not present

## 2023-01-18 DIAGNOSIS — I4891 Unspecified atrial fibrillation: Secondary | ICD-10-CM | POA: Diagnosis not present

## 2023-01-18 LAB — CBC WITH DIFFERENTIAL/PLATELET
Abs Immature Granulocytes: 0.13 10*3/uL — ABNORMAL HIGH (ref 0.00–0.07)
Basophils Absolute: 0.1 10*3/uL (ref 0.0–0.1)
Basophils Relative: 1 %
Eosinophils Absolute: 1.6 10*3/uL — ABNORMAL HIGH (ref 0.0–0.5)
Eosinophils Relative: 10 %
HCT: 32 % — ABNORMAL LOW (ref 39.0–52.0)
Hemoglobin: 10.3 g/dL — ABNORMAL LOW (ref 13.0–17.0)
Immature Granulocytes: 1 %
Lymphocytes Relative: 6 %
Lymphs Abs: 1 10*3/uL (ref 0.7–4.0)
MCH: 28.9 pg (ref 26.0–34.0)
MCHC: 32.2 g/dL (ref 30.0–36.0)
MCV: 89.6 fL (ref 80.0–100.0)
Monocytes Absolute: 1.4 10*3/uL — ABNORMAL HIGH (ref 0.1–1.0)
Monocytes Relative: 9 %
Neutro Abs: 12.5 10*3/uL — ABNORMAL HIGH (ref 1.7–7.7)
Neutrophils Relative %: 73 %
Platelets: 238 10*3/uL (ref 150–400)
RBC: 3.57 MIL/uL — ABNORMAL LOW (ref 4.22–5.81)
RDW: 18.1 % — ABNORMAL HIGH (ref 11.5–15.5)
WBC: 16.7 10*3/uL — ABNORMAL HIGH (ref 4.0–10.5)
nRBC: 0 % (ref 0.0–0.2)

## 2023-01-18 LAB — RENAL FUNCTION PANEL
Albumin: 2.3 g/dL — ABNORMAL LOW (ref 3.5–5.0)
Anion gap: 20 — ABNORMAL HIGH (ref 5–15)
BUN: 41 mg/dL — ABNORMAL HIGH (ref 6–20)
CO2: 20 mmol/L — ABNORMAL LOW (ref 22–32)
Calcium: 7.4 mg/dL — ABNORMAL LOW (ref 8.9–10.3)
Chloride: 92 mmol/L — ABNORMAL LOW (ref 98–111)
Creatinine, Ser: 12.31 mg/dL — ABNORMAL HIGH (ref 0.61–1.24)
GFR, Estimated: 4 mL/min — ABNORMAL LOW (ref >60)
Glucose, Bld: 90 mg/dL (ref 70–99)
Phosphorus: 3.2 mg/dL (ref 2.5–4.6)
Potassium: 3.3 mmol/L — ABNORMAL LOW (ref 3.5–5.1)
Sodium: 132 mmol/L — ABNORMAL LOW (ref 135–145)

## 2023-01-18 LAB — MAGNESIUM: Magnesium: 2.3 mg/dL (ref 1.7–2.4)

## 2023-01-18 SURGERY — APPLICATION, GRAFT, SKIN, SPLIT-THICKNESS
Anesthesia: General | Site: Leg Lower | Laterality: Left

## 2023-01-18 MED ORDER — LORAZEPAM 0.5 MG PO TABS
0.5000 mg | ORAL_TABLET | Freq: Three times a day (TID) | ORAL | Status: DC | PRN
  Administered 2023-01-18 – 2023-01-22 (×3): 0.5 mg via ORAL
  Filled 2023-01-18 (×3): qty 1

## 2023-01-18 MED ORDER — IOHEXOL 300 MG/ML  SOLN
100.0000 mL | Freq: Once | INTRAMUSCULAR | Status: AC | PRN
  Administered 2023-01-18: 100 mL via INTRAVENOUS

## 2023-01-18 MED ORDER — ADENOSINE 6 MG/2ML IV SOLN
12.0000 mg | Freq: Once | INTRAVENOUS | Status: AC
  Administered 2023-01-18: 12 mg via INTRAVENOUS

## 2023-01-18 MED ORDER — AMIODARONE IV BOLUS ONLY 150 MG/100ML
150.0000 mg | Freq: Once | INTRAVENOUS | Status: AC
  Administered 2023-01-18: 150 mg via INTRAVENOUS

## 2023-01-18 MED ORDER — ADENOSINE 6 MG/2ML IV SOLN
6.0000 mg | Freq: Once | INTRAVENOUS | Status: AC
  Administered 2023-01-18: 6 mg via INTRAVENOUS
  Filled 2023-01-18: qty 2

## 2023-01-18 MED ORDER — SODIUM CHLORIDE 0.9 % IV SOLN
2.0000 g | Freq: Once | INTRAVENOUS | Status: AC
  Administered 2023-01-18: 2 g via INTRAVENOUS
  Filled 2023-01-18: qty 12.5

## 2023-01-18 MED ORDER — SODIUM CHLORIDE 0.9 % IV SOLN: 2.0000 g | INTRAVENOUS | Status: DC

## 2023-01-18 MED ORDER — CHLORHEXIDINE GLUCONATE CLOTH 2 % EX PADS
6.0000 | MEDICATED_PAD | Freq: Every day | CUTANEOUS | Status: DC
  Administered 2023-01-18 – 2023-01-25 (×8): 6 via TOPICAL

## 2023-01-18 MED ORDER — IOHEXOL 9 MG/ML PO SOLN
500.0000 mL | ORAL | Status: AC
  Administered 2023-01-18 (×2): 500 mL via ORAL

## 2023-01-18 MED ORDER — POTASSIUM CHLORIDE CRYS ER 20 MEQ PO TBCR: 20.0000 meq | EXTENDED_RELEASE_TABLET | Freq: Once | ORAL | Status: DC

## 2023-01-18 NOTE — Progress Notes (Signed)
Pt in SVT. Dr. Wynetta Emery and Dr Harrington Challenger secure chatted.  EKG ordered and completed. Dr. Wynetta Emery at bedside. Adenosine was ordered by Dr. Harrington Challenger. Dr. Wynetta Emery present, crash cart present, and Iona Beard and [place] RN at bedside. '6mg'$  of adenosine drawn and given. '12mg'$  x2 was ordered and given. EKG on. Bolus of Amio was ordered and given. Amio gtt ordered to be increased from '30mg'$  to '60mg'$ .  Pt currently in bed. Pt request PRN ativan and was given to him. Family at bedside.

## 2023-01-18 NOTE — Procedures (Signed)
   HEMODIALYSIS TREATMENT NOTE:  Labile BP -- NBP is being taken in right leg; readings are unreliable.  Pt reports difficulty capturing BP, even at outpatient HD.  "It's all over the place.  I'll let you know if I can feel it [symptomatic hypotension]."  Pre-HD standing scale weight reflects 6.2 kg of available weight.  "No way that's true.  I wouldn't be able to breathe if I had that much on."  Lungs clear, periorbital edema "is always there".   Afib 90-120s with Amiodarone infusion at '30mg'$ /h.  3.75h low-heparin treatment completed using left forearm AVF (15g/antegrade).  Had some tachycardia, as high as 146 bpm, non-sustained -- resolved with interruption in UF. Goal met: 1.6 liters removed.  All blood was returned.

## 2023-01-18 NOTE — Procedures (Signed)
Patient was seen on dialysis and the procedure was supervised.  BFR 400  Via AVF BP is  uneliable.   Patient appears to be tolerating treatment well  [name] 01/18/2023

## 2023-01-18 NOTE — Progress Notes (Addendum)
Patient Name: [name] Date of Encounter: 01/18/2023 [place] Cardiologist: None   Interval Summary   Patient seen on a.m. rounds.  Denies any chest pain or shortness of breath.  Is scheduled to have hemodialysis today as he was 1 unable to have dialysis yesterday due to hypotension.  Continued on amiodarone 30 mg/hr.  Vital Signs   Vitals:   01/18/23 0945 01/18/23 1000 01/18/23 1015 01/18/23 1030  BP: 100/85  (!) 102/53   Pulse:      Resp: '16 18 19 20  '$ Temp:      TempSrc:      SpO2:      Weight:      Height:        Intake/Output Summary (Last 24 hours) at 01/18/2023 1057 Last data filed at 01/18/2023 0441 Gross per 24 hour  Intake 1245.19 ml  Output --  Net 1245.19 ml      01/18/2023    7:27 AM 01/18/2023    5:00 AM 01/17/2023    6:12 PM  Last 3 Weights  Weight (lbs) 222 lb 0.1 oz 222 lb 0.1 oz 222 lb 3.6 oz  Weight (kg) 100.7 kg 100.7 kg 100.8 kg      Telemetry/ECG    Atrial fibrillation with several episodes of aberrancy versus NSVT with rates from 90-120- Personally Reviewed  Physical Exam  GEN: No acute distress.   Neck: No JVD Cardiac: Irregularly irregular, no murmurs, rubs, or gallops.  Respiratory: Clear to auscultation bilaterally.  Respirations are unlabored at rest on room air GI: Soft, nontender, non-distended  MS: Trace edema, dressing to the left lower extremity  Assessment & Plan    PSVT -Recurrent episodes of PSVT in the setting of sepsis and systemic illness -Chronic hypotension on home midodrine limiting AV nodal agent dosing -Remains on amnio drip at 30 mg an hour -Noted elevated heart rates throughout the night -Consider transitioning IV amiodarone to oral amnio after hemodialysis if rate remains controlled  Paroxysmal atrial fibrillation -Longstanding history of PAF -Remains in atrial fibrillation -Continued on amiodarone 30 mg an hour -Previous anticoagulation with warfarin and then apixaban, developed GI bleeding  underwent Watchman procedure 08/2021 -Previously had been managed on aspirin and low-dose metoprolol -Several episodes of elevated heart rates noted throughout the night  CAD status post PCI/DES to the LAD in 2022 -Denies any chest pain -Continued on aspirin and statin -PTA beta-blocker therapy remains on hold due to hypotension -Continue with telemetry monitoring -EKG for pain or changes as needed  End-stage renal disease -Management per nephrology Hyperlipidemia -Continued on atorvastatin  Hypotension -Blood pressure 102/53 -Bolus of normal saline given this morning -Continue to midodrine 15 mg 3 times a day -Vital signs per unit protocol  History of left lower extremity DVT -Previously treated with apixaban for 3 months -Diagnosed 08/2022  For questions or updates, please contact [place] Please consult [email or web address] for contact info under        Signed, SHERI HAMMOCK, NP   Patient seen and examined  I agree with findings as noted by S Hammock above     Pt comfortable in bed  Just finished dialysis HR 110s to 120s      On exam   JVP not elevated Lung are CTA Cardiac exam   Irreg irreg  No S3 Abd supple Ext are with tr edema      Rhythm   Still with afib with RVR   Rates overall improving  Continue IV amiodarone for  now   THis is not meant to be a permanent Rx  Rather for exacerbation of SVT.   REassess as improves   Will continue to follow   Dorris Carnes MD

## 2023-01-18 NOTE — Progress Notes (Signed)
Pharmacy Antibiotic Note  [name] is a 57 y.o. male admitted on 01/10/2023 with sepsis.  Pharmacy has been consulted for cefepime dosing.  Plan: Cefepime 2000 mg IV every MWF w/ HD. Monitor labs, c/s, and patient improvement.   Height: '6\' 1"'$  (185.4 cm) Weight: 100.7 kg (222 lb 0.1 oz) IBW/kg (Calculated) : 79.9  Temp (24hrs), Avg:98.6 F (37 C), Min:97.7 F (36.5 C), Max:99.7 F (37.6 C)  Recent Labs  Lab 01/13/23 1606 01/13/23 1742 01/14/23 0421 01/15/23 0430 01/16/23 0352 01/17/23 0318 01/18/23 0431  WBC  --   --  17.2* 14.6* 13.8* 14.7* 16.7*  CREATININE  --   --  11.75* 13.15* 8.95* 10.61* 12.31*  LATICACIDVEN 3.4* 1.5  --   --   --   --   --     Estimated Creatinine Clearance: 8.6 mL/min (A) (by C-G formula based on SCr of 12.31 mg/dL (H)).    Allergies  Allergen Reactions   Vancomycin Swelling, Hives, Itching, Other (See Comments) and Rash    Lip swelling  Lip swelling, Reaction Type: Allergy; Reaction(s): swelling of lips  Reaction Type: Allergy; Reaction(s): swelling of lips    Antimicrobials this admission: Ceftriaxone 3/3>>3/4 Cefepime 2/26 >>3/3 restarted 3/5 >> Zyvox 2/26 >>3/2 Flagyl 2/29 >>    Microbiology results: 2/29 BCX: ngtd 2/26 Bcx: ng 2/27 MRSA PCR: neg  Thank you for allowing pharmacy to be a part of this patient's care.  Margot Ables, PharmD Clinical Pharmacist 01/18/2023 1:58 PM

## 2023-01-18 NOTE — Progress Notes (Signed)
Subjective:  still appearing to have HR variability on IV amio still -  HD got put off today due to high census  Objective Vital signs in last 24 hours: Vitals:   01/18/23 0500 01/18/23 0600 01/18/23 0641 01/18/23 0727  BP: 124/61 (!) 76/42 (!) 83/63 96/62  Pulse: (!) 36 91  (!) 116  Resp: (!) '26 15 17 16  '$ Temp: 98.4 F (36.9 C)   98.2 F (36.8 C)  TempSrc: Oral   Oral  SpO2: 100% 100%  100%  Weight: 100.7 kg     Height:       Weight change:   Intake/Output Summary (Last 24 hours) at 01/18/2023 0754 Last data filed at 01/18/2023 0441 Gross per 24 hour  Intake 1459.24 ml  Output --  Net 1459.24 ml   HD orders at [facility]  MWF 3 hours 45 min using AVF- 15 gauge needles 400 BFR/500 DFR- 2/2.5 bath,  temp 36.5-  gets heparin 1000 load, then 200 per hour, also on venofer 50 per week  EDW 94.5   Assessment/ Plan: Pt is a 57 y.o. yo male with ESRD who was admitted on 01/10/2023 with fever, afib and hypotension  Assessment/Plan: 1. Fever-  suspicious for sepsis-  placed on broad spectrum abx-  now only ceftriaxone, id following-  cultures negative so far-  fever resolved but WBC climbing ?  2. ESRD - normally MWF via AVF -  planning for routine HD today  off schedule - will minimize UF given heart and BP issues.  Pt prefers to run tomorrow to get back on schedule so will try to do as census allows 3. Anemia-  does not appear to be an issue-  on iron and no ESA as OP-  have not dosed here 4. Secondary hyperparathyroidism-  not on active vit D as OP-  seems to be on calcium supp-  no binder listed on meds, he confirms no binder-   phos has been fine 5. HTN/volume- in afib and hypotensive-  his reg dose of midodrine has been increased to avoid pressors here.  fluid removal as tolerated with HD  -   he says he takes as much as 35 mg of midodrine on dialysis days-  pt seems knowledgeable about what he can handle volume removal wise and what he can't-   pulling only a little today [name]    Labs: Basic Metabolic Panel: Recent Labs  Lab 01/16/23 0352 01/17/23 0318 01/18/23 0431  NA 132* 132* 132*  K 4.1 3.9 3.3*  CL 91* 90* 92*  CO2 25 23 20*  GLUCOSE 84 92 90  BUN 24* 33* 41*  CREATININE 8.95* 10.61* 12.31*  CALCIUM 7.7* 7.9* 7.4*  PHOS 2.4* 3.0 3.2   Liver Function Tests: Recent Labs  Lab 01/15/23 0430 01/16/23 0352 01/17/23 0318 01/18/23 0431  AST 24  --   --   --   ALT 7  --   --   --   ALKPHOS 204*  --   --   --   BILITOT 0.6  --   --   --   PROT 6.0*  --   --   --   ALBUMIN 2.2*  2.2* 2.5* 2.7* 2.3*   No results for input(s): "LIPASE", "AMYLASE" in the last 168 hours. No results for input(s): "AMMONIA" in the last 168 hours. CBC: Recent Labs  Lab 01/14/23 0421 01/14/23 0421 01/15/23 0430 01/16/23 [id number] 01/17/23 [id number]  01/18/23 0431  WBC 17.2*  --  14.6* 13.8* 14.7* 16.7*  NEUTROABS  --    < > 10.6* 10.6* 10.9* 12.5*  HGB 11.8*  --  11.3* 11.4* 11.5* 10.3*  HCT 41.0  --  37.8* 37.9* 37.2* 32.0*  MCV 100.0  --  96.2 95.0 93.2 89.6  PLT 274  --  245 252 269 238   < > = values in this interval not displayed.   Cardiac Enzymes: No results for input(s): "CKTOTAL", "CKMB", "CKMBINDEX", "TROPONINI" in the last 168 hours. CBG: Recent Labs  Lab 01/11/23 2109  GLUCAP 96    Iron Studies: No results for input(s): "IRON", "TIBC", "TRANSFERRIN", "FERRITIN" in the last 72 hours. Studies/Results: ECHOCARDIOGRAM COMPLETE  Result Date: 01/16/2023    ECHOCARDIOGRAM REPORT   Patient Name:   [name] Date of Exam: 01/16/2023 Medical Rec #:  [id number]      Height:       73.0 in Accession #:    [id number]     Weight:       216.7 lb Date of Birth:  [birth year]      BSA:          2.226 m Patient Age:    48 years       BP:           81/27 mmHg Patient Gender: M              HR:           131 bpm. Exam Location:  [facility] Procedure: 2D Echo, Cardiac Doppler and Color Doppler Indications:    Fever  History:        Patient has  prior history of Echocardiogram examinations, most                 recent 11/18/2016. CHF, Arrythmias:Atrial Fibrillation,                 Signs/Symptoms:Fever; Risk Factors:Dyslipidemia. ESRD on                 Dialysis.  Sonographer:    Wenda Low Referring Phys: [place]  1. Left ventricular ejection fraction, by estimation, is 65 to 70%. The left ventricle has normal function. The left ventricle has no regional wall motion abnormalities. There is moderate asymmetric left ventricular hypertrophy of the basal-septal segment. Left ventricular diastolic parameters are indeterminate.  2. Right ventricular systolic function is mildly reduced. The right ventricular size is mildly enlarged. There is normal pulmonary artery systolic pressure. The estimated right ventricular systolic pressure is [id number] mmHg.  3. Right atrial size was mildly dilated.  4. The mitral valve is degenerative. Trivial mitral valve regurgitation.  5. The aortic valve was not well visualized. Aortic valve regurgitation is not visualized. Aortic valve sclerosis/calcification is present, without any evidence of aortic stenosis.  6. The inferior vena cava is dilated in size with >50% respiratory variability, suggesting right atrial pressure of 8 mmHg. FINDINGS  Left Ventricle: Left ventricular ejection fraction, by estimation, is 65 to 70%. The left ventricle has normal function. The left ventricle has no regional wall motion abnormalities. The left ventricular internal cavity size was small. There is moderate  asymmetric left ventricular hypertrophy of the basal-septal segment. Left ventricular diastolic parameters are indeterminate. Right Ventricle: The right ventricular size is mildly enlarged. Right vetricular wall thickness was not well visualized. Right ventricular systolic function is mildly reduced. There is normal pulmonary artery systolic pressure. The tricuspid regurgitant  velocity is 2.52 m/s, and with an  assumed right atrial pressure of 8 mmHg, the estimated right ventricular systolic pressure is [id number] mmHg. Left Atrium: Left atrial size was normal in size. Right Atrium: Right atrial size was mildly dilated. Pericardium: There is no evidence of pericardial effusion. Mitral Valve: The mitral valve is degenerative in appearance. Trivial mitral valve regurgitation. MV peak gradient, 6.2 mmHg. The mean mitral valve gradient is 3.0 mmHg. Tricuspid Valve: The tricuspid valve is normal in structure. Tricuspid valve regurgitation is trivial. Aortic Valve: The aortic valve was not well visualized. Aortic valve regurgitation is not visualized. Aortic valve sclerosis/calcification is present, without any evidence of aortic stenosis. Aortic valve mean gradient measures 6.0 mmHg. Aortic valve peak gradient measures 12.4 mmHg. Aortic valve area, by VTI measures 2.58 cm. Pulmonic Valve: The pulmonic valve was not well visualized. Pulmonic valve regurgitation is not visualized. Aorta: The aortic root is normal in size and structure. Venous: The inferior vena cava is dilated in size with greater than 50% respiratory variability, suggesting right atrial pressure of 8 mmHg. IAS/Shunts: No atrial level shunt detected by color flow Doppler.  LEFT VENTRICLE PLAX 2D LVIDd:         4.20 cm   Diastology LVIDs:         2.80 cm   LV e' medial:    7.51 cm/s LV PW:         1.40 cm   LV E/e' medial:  14.5 LV IVS:        1.40 cm   LV e' lateral:   7.62 cm/s LVOT diam:     2.10 cm   LV E/e' lateral: 14.3 LV SV:         68 LV SV Index:   30 LVOT Area:     3.46 cm  RIGHT VENTRICLE RV Basal diam:  3.95 cm RV Mid diam:    3.10 cm RV S prime:     13.10 cm/s LEFT ATRIUM           Index        RIGHT ATRIUM           Index LA diam:      4.40 cm 1.98 cm/m   RA Area:     22.90 cm LA Vol (A2C): 47.0 ml 21.11 ml/m  RA Volume:   68.60 ml  30.81 ml/m LA Vol (A4C): 46.9 ml 21.07 ml/m  AORTIC VALVE                     PULMONIC VALVE AV Area (Vmax):    2.32  cm      PV Vmax:       0.87 m/s AV Area (Vmean):   2.61 cm      PV Peak grad:  3.0 mmHg AV Area (VTI):     2.58 cm AV Vmax:           176.00 cm/s AV Vmean:          104.000 cm/s AV VTI:            0.262 m AV Peak Grad:      12.4 mmHg AV Mean Grad:      6.0 mmHg LVOT Vmax:         118.00 cm/s LVOT Vmean:        78.300 cm/s LVOT VTI:          0.195 m LVOT/AV VTI ratio: 0.74  AORTA Ao Root diam: 3.30  cm MITRAL VALVE                TRICUSPID VALVE MV Area (PHT): 4.21 cm     TR Peak grad:   25.4 mmHg MV Area VTI:   3.50 cm     TR Vmax:        252.00 cm/s MV Peak grad:  6.2 mmHg MV Mean grad:  3.0 mmHg     SHUNTS MV Vmax:       1.24 m/s     Systemic VTI:  0.20 m MV Vmean:      73.3 cm/s    Systemic Diam: 2.10 cm MV Decel Time: 180 msec MV E velocity: 109.00 cm/s Oswaldo Milian MD Electronically signed by Oswaldo Milian MD Signature Date/Time: 01/16/2023/1:23:23 PM    Final    DG CHEST PORT 1 VIEW  Result Date: 01/16/2023 CLINICAL DATA:  Sepsis. EXAM: PORTABLE CHEST 1 VIEW COMPARISON:  Chest x-ray January 14, 2023 FINDINGS: Opacity remains in the periphery of the right lung base, mildly improved in the interval. The cardiomediastinal silhouette is stable. No pneumothorax. No nodules or masses. No focal infiltrates. IMPRESSION: Opacity in the periphery of the right lung base is mildly improved in the interval. No other interval changes. Electronically Signed   By: Dorise Bullion III M.D.   On: 01/16/2023 08:57   Medications: Infusions:  sodium chloride 10 mL/hr at 01/18/23 0441   albumin human 60 mL/hr at 01/18/23 0441   amiodarone 30 mg/hr (01/18/23 0441)   cefTRIAXone (ROCEPHIN)  IV Stopped (01/17/23 1456)   metronidazole 500 mg (01/18/23 0549)    Scheduled Medications:  aspirin EC  81 mg Oral q AM   atorvastatin  80 mg Oral QHS   calcium carbonate  1,500 mg Oral QHS   Chlorhexidine Gluconate Cloth  6 each Topical Q0600   famotidine  20 mg Oral QHS   heparin  5,000 Units Subcutaneous Q8H    midodrine  15 mg Oral TID WC   pantoprazole  40 mg Oral Q breakfast   saccharomyces boulardii  250 mg Oral BID   tamsulosin  0.4 mg Oral QPM    have reviewed scheduled and prn medications.  Physical Exam: General:  NAD Heart:irreg/tachy Lungs: mostly clear Abdomen: soft, non tender Extremities: no peripheral edema Dialysis Access: left forearm AVF-  no signs of infection    01/18/2023,7:54 AM  LOS: 8 days

## 2023-01-18 NOTE — Progress Notes (Signed)
PROGRESS NOTE    [name]  [id number] DOB: [birth year] DOA: 01/10/2023 PCP: Gardiner Rhyme, MD  Brief Narrative:  [name] is a 57 y.o. male with medical history significant of atrial fibrillation (Watchman device implanted), CHF, ESRD on dialysis MWF, DVT of left lower extremity, arterial ulcer of RLE, GERD, HLD, and chronic hypotension on midodrine admitted on 2/26 for A-fib with RVR and soft BP, meeting SIRS criteria for sepsis.  Admitted to ICU at [facility].  Patient had multiple runs of SVT and Cardiology was consulted; recommended amiodarone drip.  Patient continued to have SVT runs intermittently. Nephrology consulted for HD and WOC consulted for care of arterial ulcer on RLE.   Started cefepime, metronidazole, and linezolid on admission, then narrowed to cefepime and linezolid 2/28, but refevered on 2/29.  New blood cultures were obtained on 2/29 due to refevering.  In parallel, WBC continued to trend up from admission through 3/1, then stabilized.  Infectious disease consulted and recommended TTE, which did not show any vegetations.  CXR did show some questionable right lung base pleural effusion versus consolidation consistent with possible pneumonia.  MRSA PCR negative and linezolid stopped.  Patient's wife insisted on discontinuing cefepime in favor of ceftriaxone.  Patient refevered again 3/3 after stopping cefepime.  Restarted cefepime 3/5 with wife's agreement.  Patient continued to receive HD on MWF schedule, complicated by his hypotension and tachycardia.      Problem List:   Principal Problem:   Sepsis ([place]) Active Problems:   Atrial fibrillation with rapid ventricular response (HCC)   GERD (gastroesophageal reflux disease)   ESRD on dialysis ([place])   Hypotension   Mixed hyperlipidemia   History of DVT (deep vein thrombosis)   Arterial insufficiency with ischemic ulcer ([place])  Assessment and Plan:  Septic shock Still no source of infection, all studies and  labs negative, though considering ?pneumonia on CXR at right lung base.  CXR 3/3 was improved.  Wife insisted on ceftriaxone 3/3, patient did refever once cefepime discontinued.  Cefepime restarted 3/5 with wife's agreement.  TTE unremarkable, considering TEE. - Continue cefepime and metronidazole - Midodrine for hypotension, minimize norepi use - No growth in blood Cx @ 4 days - WBC stably elevated, afebrile today - Consider starting arterial line when able for more accurate BP if still concerned about hypotension - Appreciate Infectious Disease recs   ESRD on hemodialysis MWF Tolerating HD, has AV fistula. - Appreciate Nephrology recs, notes:             - Renal labs daily             - Has secondary hyperPTH             - On iron and ESA for anemia   Hypotension, chronic - Continue high dose midodrine on dialysis days (20 mg); 15 mg on non HD days - He has been weaned off norepinephrine and will try not to restart this unless absolutely needed  - Continue to monitor   Paroxysmal atrial fibrillation PSVT Continues to be tachycardic and in RVR with intermittent SVTs.  Currently on amiodarone gtt per cardiology, no plan for DCCV yet.  Will need to transition to PO amiodarone or other option.  S/p Watchman device, not a candidate for chronic anticoagulation, discontinued May 2022 due to GI bleed. - Cardiac tele monitoring - Cardiology following closely, appreciate recs: - Holding metoprolol and diltiazem in setting of hypotension - Amiodarone rebolus daily, continue gtt - When rhythm stable,  transition to oral amiodarone 200 mg BID for 3 weeks, then 200 mg daily   Arterial ulcer of LLE Does not appear infected, but still possible source of infection.  Healing poorly and likely needs vascular surgery outpatient visit.  See WOC below. - Wound care per [place] nurse recs - Minimize use of norepinephrine, concern for vasoconstriction in area of poor circulation  History of DVT History of  DVT in the left lower extremity, finish 90 days of Eliquis.  Complained of left leg swelling, repeat venous Doppler on 01/11/2023 without acute DVT.  Now with positive Homan's sign and notable L calf pain. - DVT US - Continue to monitor   GERD - Continue famotidine, added omeprazole per patient request yesterday; H2B also important for stress ulcer PPx best practice   Mixed hyperlipidemia - Continue statin   OSA - CPAP nightly   Skin Assessment: I have examined the patient's skin and I agree with the wound assessment as performed by the wound care RN as outlined below:   Wound type: full thickness, s/p debridement by plastics 11/30/2022  Pressure Injury POA: NA, not pressure related  Measurement: see nursing flowsheets, last measurements by plastics 8 cms x 6.25 cms  Wound bed: 100% red moist, appears to be tendon showing at superior portion of wound  Drainage (amount, consistency, odor)  see nursing flowsheet  Periwound: intact  Dressing procedure/placement/frequency: Clean L anterior ankle wound with NS, apply Silver Hydrofiber Kellie Simmering [phone]) cut to fit wound bed daily. Cover with dry gauze and  secure with Kerlix and 4"  Ace wrap Kellie Simmering [phone]).  May need to moisten silver with NS if stuck to wound bed at dressing changes.   DVT prophylaxis: [place] heparin Code Status: Full Family Communication: Wife 3/3 on phone Disposition Plan: Patient is from: Home Anticipated d/c is to: Home Anticipated d/c date is: Pending clinical improvement Patient currently: Pending fever workup Admission Status is: Inpatient Remains inpatient appropriate because: Critical illness and persistent febrile illness   DVT prophylaxis: [place] heparin Code Status: Full Family Communication: Wife 3/5 on phone Disposition Plan: Patient is from: Home Anticipated d/c is to: Home Anticipated d/c date is: Pending clinical improvement Patient currently: Pending fever workup Admission Status is:  Inpatient Remains inpatient appropriate because: Critical illness and persistent febrile illness   Consultants:  Infectious Disease Cardiology Nephrology WOC   Procedures:  DVT US 2/27 unremarkable TTE 3/3 w/out vegetations   Antimicrobials:  Metronidazole (2/26, 2/29>???) Ceftriaxone (3/3>3/5) Cefepime (2/27>3/2, 3/5>???) Linezolid (2/27>3/2)  Subjective: Patient seen and evaluated today with no new acute complaints or concerns. No acute concerns or events noted overnight.  This morning, patient reports that he is feeling pretty well overall.  No experiencing shortness of breath or palpitations at this time.  Says he slept well.  Notes that while his labs may not really looking perfect he himself feels fine.  Does report that he still continues to feel weakness.  Expresses concern about the state of his left lower extremity arterial ulcer.  Objective: Vitals:   01/18/23 0400 01/18/23 0500 01/18/23 0600 01/18/23 0641  BP: (!) 100/46 124/61 (!) 76/42 (!) 83/63  Pulse: 89 (!) 36 91   Resp: 19 (!) '26 15 17  '$ Temp:  98.4 F (36.9 C)    TempSrc:  Oral    SpO2: 94% 100% 100%   Weight:  100.7 kg    Height:        Intake/Output Summary (Last 24 hours) at 01/18/2023 0731 Last data  filed at 01/18/2023 0441 Gross per 24 hour  Intake 1459.24 ml  Output --  Net 1459.24 ml   Filed Weights   01/15/23 1547 01/17/23 1812 01/18/23 0500  Weight: 98.3 kg 100.8 kg 100.7 kg    Examination: General: Adult male resting in bed in no acute distress. HEENT: Moist mucous membranes.  PERRLA. Lungs: Light minus crackles at right lung base otherwise clear to auscultation.  No wheezes or rhonchi. Cardiovascular: Tachycardic, irregular rhythm.  No JVD.  Normal S1/S2.  No murmurs/rubs/gallops. Abdomen: Soft, nondistended.  No tenderness to palpation. Extremities: Left lower extremity ulcer (see images below) covered in gray collagen paste and without surrounding erythema or edema.  Lots of  granulation tissue as well as some tendons visible.  No purulence or drainage.  1+ DP and PT pulses on left foot.  Trace peripheral edema.  Capillary refill less than 2 seconds. Skin: Chronic venous stasis changes over bilateral shins.  Dry, papery skin. Neuro: Alert and oriented x 4.  No focal neurological deficit.      Data Reviewed: I have personally reviewed following labs and imaging studies.  CBC: Recent Labs  Lab 01/14/23 0421 01/15/23 0430 01/16/23 0352 01/17/23 0318 01/18/23 0431  WBC 17.2* 14.6* 13.8* 14.7* 16.7*  NEUTROABS  --  10.6* 10.6* 10.9* 12.5*  HGB 11.8* 11.3* 11.4* 11.5* 10.3*  HCT 41.0 37.8* 37.9* 37.2* 32.0*  MCV 100.0 96.2 95.0 93.2 89.6  PLT 274 245 252 269 [id number]   Basic Metabolic Panel: Recent Labs  Lab 01/12/23 1224 01/13/23 0421 01/14/23 0421 01/15/23 0430 01/16/23 0352 01/17/23 0318 01/18/23 0431  NA 136   < > 135 131* 132* 132* 132*  K 4.3   < > 4.2 3.9 4.1 3.9 3.3*  CL 97*   < > 93* 92* 91* 90* 92*  CO2 24   < > '24 22 25 23 '$ 20*  GLUCOSE 124*   < > 146* 115* 84 92 90  BUN 49*   < > 38* 41* 24* 33* 41*  CREATININE 12.71*   < > 11.75* 13.15* 8.95* 10.61* 12.31*  CALCIUM 8.1*   < > 8.0* 7.6* 7.7* 7.9* 7.4*  MG 2.2  --   --   --  1.7  --   --   PHOS  --   --   --  3.2 2.4* 3.0 3.2   < > = values in this interval not displayed.   GFR: Estimated Creatinine Clearance: 8.6 mL/min (A) (by C-G formula based on SCr of 12.31 mg/dL (H)). Liver Function Tests: Recent Labs  Lab 01/15/23 0430 01/16/23 0352 01/17/23 0318 01/18/23 0431  AST 24  --   --   --   ALT 7  --   --   --   ALKPHOS 204*  --   --   --   BILITOT 0.6  --   --   --   PROT 6.0*  --   --   --   ALBUMIN 2.2*  2.2* 2.5* 2.7* 2.3*   No results for input(s): "LIPASE", "AMYLASE" in the last 168 hours. No results for input(s): "AMMONIA" in the last 168 hours. Coagulation Profile: No results for input(s): "INR", "PROTIME" in the last 168 hours. Cardiac Enzymes: No results for  input(s): "CKTOTAL", "CKMB", "CKMBINDEX", "TROPONINI" in the last 168 hours. BNP (last 3 results) No results for input(s): "PROBNP" in the last 8760 hours. HbA1C: No results for input(s): "HGBA1C" in the last 72 hours. CBG: Recent Labs  Lab  01/11/23 2109  GLUCAP 96   Lipid Profile: No results for input(s): "CHOL", "HDL", "LDLCALC", "TRIG", "CHOLHDL", "LDLDIRECT" in the last 72 hours. Thyroid Function Tests: No results for input(s): "TSH", "T4TOTAL", "FREET4", "T3FREE", "THYROIDAB" in the last 72 hours. Anemia Panel: No results for input(s): "VITAMINB12", "FOLATE", "FERRITIN", "TIBC", "IRON", "RETICCTPCT" in the last 72 hours. Sepsis Labs: Recent Labs  Lab 01/12/23 0700 01/13/23 1606 01/13/23 1742 01/14/23 1318 01/15/23 0430 01/16/23 0352  PROCALCITON 5.42  --   --  6.64 11.08 15.70  LATICACIDVEN  --  3.4* 1.5  --   --   --     Recent Results (from the past 240 hour(s))  Culture, blood (Routine X 2) w Reflex to ID Panel     Status: None   Collection Time: 01/10/23  7:00 PM   Specimen: BLOOD RIGHT ARM  Result Value Ref Range Status   Specimen Description BLOOD RIGHT ARM  Final   Special Requests   Final    BOTTLES DRAWN AEROBIC AND ANAEROBIC Blood Culture adequate volume   Culture   Final    NO GROWTH 5 DAYS Performed at [facility], [address], [place], [place] [address]    Report Status 01/15/2023 FINAL  Final  Culture, blood (Routine x 2)     Status: None   Collection Time: 01/10/23  7:35 PM   Specimen: BLOOD  Result Value Ref Range Status   Specimen Description BLOOD BLOOD RIGHT WRIST  Final   Special Requests   Final    BOTTLES DRAWN AEROBIC AND ANAEROBIC Blood Culture adequate volume   Culture   Final    NO GROWTH 5 DAYS Performed at [facility], [address], [place], [place] [address]    Report Status 01/15/2023 FINAL  Final  Resp panel by RT-PCR (RSV, Flu A&B, Covid) Anterior Nasal Swab     Status: None   Collection Time: 01/10/23  8:18 PM    Specimen: Anterior Nasal Swab  Result Value Ref Range Status   SARS Coronavirus 2 by RT PCR NEGATIVE NEGATIVE Final    Comment: (NOTE) SARS-CoV-2 target nucleic acids are NOT DETECTED.  The SARS-CoV-2 RNA is generally detectable in upper respiratory specimens during the acute phase of infection. The lowest concentration of SARS-CoV-2 viral copies this assay can detect is 138 copies/mL. A negative result does not preclude SARS-Cov-2 infection and should not be used as the sole basis for treatment or other patient management decisions. A negative result may occur with  improper specimen collection/handling, submission of specimen other than nasopharyngeal swab, presence of viral mutation(s) within the areas targeted by this assay, and inadequate number of viral copies(<138 copies/mL). A negative result must be combined with clinical observations, patient history, and epidemiological information. The expected result is Negative.  Fact Sheet for Patients:  EntrepreneurPulse.com.au  Fact Sheet for Healthcare Providers:  IncredibleEmployment.be  This test is no t yet approved or cleared by the [place] FDA and  has been authorized for detection and/or diagnosis of SARS-CoV-2 by FDA under an Emergency Use Authorization (EUA). This EUA will remain  in effect (meaning this test can be used) for the duration of the COVID-19 declaration under Section 564(b)(1) of the Act, 21 U.S.C.section 360bbb-3(b)(1), unless the authorization is terminated  or revoked sooner.       Influenza A by PCR NEGATIVE NEGATIVE Final   Influenza B by PCR NEGATIVE NEGATIVE Final    Comment: (NOTE) The Xpert Xpress SARS-CoV-2/FLU/RSV plus assay is intended as an aid  in the diagnosis of influenza from Nasopharyngeal swab specimens and should not be used as a sole basis for treatment. Nasal washings and aspirates are unacceptable for Xpert Xpress  SARS-CoV-2/FLU/RSV testing.  Fact Sheet for Patients: EntrepreneurPulse.com.au  Fact Sheet for Healthcare Providers: IncredibleEmployment.be  This test is not yet approved or cleared by the [place] FDA and has been authorized for detection and/or diagnosis of SARS-CoV-2 by FDA under an Emergency Use Authorization (EUA). This EUA will remain in effect (meaning this test can be used) for the duration of the COVID-19 declaration under Section 564(b)(1) of the Act, 21 U.S.C. section 360bbb-3(b)(1), unless the authorization is terminated or revoked.     Resp Syncytial Virus by PCR NEGATIVE NEGATIVE Final    Comment: (NOTE) Fact Sheet for Patients: EntrepreneurPulse.com.au  Fact Sheet for Healthcare Providers: IncredibleEmployment.be  This test is not yet approved or cleared by the [place] FDA and has been authorized for detection and/or diagnosis of SARS-CoV-2 by FDA under an Emergency Use Authorization (EUA). This EUA will remain in effect (meaning this test can be used) for the duration of the COVID-19 declaration under Section 564(b)(1) of the Act, 21 U.S.C. section 360bbb-3(b)(1), unless the authorization is terminated or revoked.  Performed at [facility], [address], [place], [place] [address]   MRSA Next Gen by PCR, Nasal     Status: None   Collection Time: 01/11/23  2:27 PM   Specimen: Nasal Mucosa; Nasal Swab  Result Value Ref Range Status   MRSA by PCR Next Gen NOT DETECTED NOT DETECTED Final    Comment: (NOTE) The GeneXpert MRSA Assay (FDA approved for NASAL specimens only), is one component of a comprehensive MRSA colonization surveillance program. It is not intended to diagnose MRSA infection nor to guide or monitor treatment for MRSA infections. Test performance is not FDA approved in patients less than 68 years old. Performed at [facility], [address] [place],  [place] [address]   Culture, blood (Routine X 2) w Reflex to ID Panel     Status: None (Preliminary result)   Collection Time: 01/13/23  5:42 PM   Specimen: Right Antecubital; Blood  Result Value Ref Range Status   Specimen Description RIGHT ANTECUBITAL  Final   Special Requests   Final    BOTTLES DRAWN AEROBIC AND ANAEROBIC Blood Culture adequate volume   Culture   Final    NO GROWTH 4 DAYS Performed at [facility], [address], [place], [place] [address]    Report Status PENDING  Incomplete  Culture, blood (Routine X 2) w Reflex to ID Panel     Status: None (Preliminary result)   Collection Time: 01/13/23  5:42 PM   Specimen: BLOOD RIGHT HAND  Result Value Ref Range Status   Specimen Description BLOOD RIGHT HAND  Final   Special Requests   Final    BOTTLES DRAWN AEROBIC AND ANAEROBIC Blood Culture adequate volume   Culture   Final    NO GROWTH 4 DAYS Performed at [facility], [address] [place], [place] [address]    Report Status PENDING  Incomplete     Radiology Studies: ECHOCARDIOGRAM COMPLETE  Result Date: 01/16/2023    ECHOCARDIOGRAM REPORT   Patient Name:   [facility] Date of Exam: 01/16/2023 Medical Rec #:  [id number]      Height:       73.0 in Accession #:    [id number]     Weight:       216.7  lb Date of Birth:  03-12-1966      BSA:          2.226 m Patient Age:    48 years       BP:           81/27 mmHg Patient Gender: M              HR:           131 bpm. Exam Location:  [facility] Procedure: 2D Echo, Cardiac Doppler and Color Doppler Indications:    Fever  History:        Patient has prior history of Echocardiogram examinations, most                 recent 11/18/2016. CHF, Arrythmias:Atrial Fibrillation,                 Signs/Symptoms:Fever; Risk Factors:Dyslipidemia. ESRD on                 Dialysis.  Sonographer:    Wenda Low Referring Phys: [place]  1. Left ventricular ejection fraction, by estimation, is 65 to 70%. The left ventricle has  normal function. The left ventricle has no regional wall motion abnormalities. There is moderate asymmetric left ventricular hypertrophy of the basal-septal segment. Left ventricular diastolic parameters are indeterminate.  2. Right ventricular systolic function is mildly reduced. The right ventricular size is mildly enlarged. There is normal pulmonary artery systolic pressure. The estimated right ventricular systolic pressure is [id number] mmHg.  3. Right atrial size was mildly dilated.  4. The mitral valve is degenerative. Trivial mitral valve regurgitation.  5. The aortic valve was not well visualized. Aortic valve regurgitation is not visualized. Aortic valve sclerosis/calcification is present, without any evidence of aortic stenosis.  6. The inferior vena cava is dilated in size with >50% respiratory variability, suggesting right atrial pressure of 8 mmHg. FINDINGS  Left Ventricle: Left ventricular ejection fraction, by estimation, is 65 to 70%. The left ventricle has normal function. The left ventricle has no regional wall motion abnormalities. The left ventricular internal cavity size was small. There is moderate  asymmetric left ventricular hypertrophy of the basal-septal segment. Left ventricular diastolic parameters are indeterminate. Right Ventricle: The right ventricular size is mildly enlarged. Right vetricular wall thickness was not well visualized. Right ventricular systolic function is mildly reduced. There is normal pulmonary artery systolic pressure. The tricuspid regurgitant velocity is 2.52 m/s, and with an assumed right atrial pressure of 8 mmHg, the estimated right ventricular systolic pressure is [id number] mmHg. Left Atrium: Left atrial size was normal in size. Right Atrium: Right atrial size was mildly dilated. Pericardium: There is no evidence of pericardial effusion. Mitral Valve: The mitral valve is degenerative in appearance. Trivial mitral valve regurgitation. MV peak gradient, 6.2 mmHg. The mean  mitral valve gradient is 3.0 mmHg. Tricuspid Valve: The tricuspid valve is normal in structure. Tricuspid valve regurgitation is trivial. Aortic Valve: The aortic valve was not well visualized. Aortic valve regurgitation is not visualized. Aortic valve sclerosis/calcification is present, without any evidence of aortic stenosis. Aortic valve mean gradient measures 6.0 mmHg. Aortic valve peak gradient measures 12.4 mmHg. Aortic valve area, by VTI measures 2.58 cm. Pulmonic Valve: The pulmonic valve was not well visualized. Pulmonic valve regurgitation is not visualized. Aorta: The aortic root is normal in size and structure. Venous: The inferior vena cava is dilated in size with greater than 50% respiratory variability, suggesting right atrial pressure of  8 mmHg. IAS/Shunts: No atrial level shunt detected by color flow Doppler.  LEFT VENTRICLE PLAX 2D LVIDd:         4.20 cm   Diastology LVIDs:         2.80 cm   LV e' medial:    7.51 cm/s LV PW:         1.40 cm   LV E/e' medial:  14.5 LV IVS:        1.40 cm   LV e' lateral:   7.62 cm/s LVOT diam:     2.10 cm   LV E/e' lateral: 14.3 LV SV:         68 LV SV Index:   30 LVOT Area:     3.46 cm  RIGHT VENTRICLE RV Basal diam:  3.95 cm RV Mid diam:    3.10 cm RV S prime:     13.10 cm/s LEFT ATRIUM           Index        RIGHT ATRIUM           Index LA diam:      4.40 cm 1.98 cm/m   RA Area:     22.90 cm LA Vol (A2C): 47.0 ml 21.11 ml/m  RA Volume:   68.60 ml  30.81 ml/m LA Vol (A4C): 46.9 ml 21.07 ml/m  AORTIC VALVE                     PULMONIC VALVE AV Area (Vmax):    2.32 cm      PV Vmax:       0.87 m/s AV Area (Vmean):   2.61 cm      PV Peak grad:  3.0 mmHg AV Area (VTI):     2.58 cm AV Vmax:           176.00 cm/s AV Vmean:          104.000 cm/s AV VTI:            0.262 m AV Peak Grad:      12.4 mmHg AV Mean Grad:      6.0 mmHg LVOT Vmax:         118.00 cm/s LVOT Vmean:        78.300 cm/s LVOT VTI:          0.195 m LVOT/AV VTI ratio: 0.74  AORTA Ao Root diam:  3.30 cm MITRAL VALVE                TRICUSPID VALVE MV Area (PHT): 4.21 cm     TR Peak grad:   25.4 mmHg MV Area VTI:   3.50 cm     TR Vmax:        252.00 cm/s MV Peak grad:  6.2 mmHg MV Mean grad:  3.0 mmHg     SHUNTS MV Vmax:       1.24 m/s     Systemic VTI:  0.20 m MV Vmean:      73.3 cm/s    Systemic Diam: 2.10 cm MV Decel Time: 180 msec MV E velocity: 109.00 cm/s Oswaldo Milian MD Electronically signed by Oswaldo Milian MD Signature Date/Time: 01/16/2023/1:23:23 PM    Final    DG CHEST PORT 1 VIEW  Result Date: 01/16/2023 CLINICAL DATA:  Sepsis. EXAM: PORTABLE CHEST 1 VIEW COMPARISON:  Chest x-ray January 14, 2023 FINDINGS: Opacity remains in the periphery of the right lung base, mildly improved in the interval. The  cardiomediastinal silhouette is stable. No pneumothorax. No nodules or masses. No focal infiltrates. IMPRESSION: Opacity in the periphery of the right lung base is mildly improved in the interval. No other interval changes. Electronically Signed   By: Dorise Bullion III M.D.   On: 01/16/2023 08:57    Scheduled Meds:  aspirin EC  81 mg Oral q AM   atorvastatin  80 mg Oral QHS   calcium carbonate  1,500 mg Oral QHS   Chlorhexidine Gluconate Cloth  6 each Topical Q0600   famotidine  20 mg Oral QHS   heparin  5,000 Units Subcutaneous Q8H   midodrine  15 mg Oral TID WC   pantoprazole  40 mg Oral Q breakfast   saccharomyces boulardii  250 mg Oral BID   tamsulosin  0.4 mg Oral QPM   Continuous Infusions:  sodium chloride 10 mL/hr at 01/18/23 0441   albumin human 60 mL/hr at 01/18/23 0441   amiodarone 30 mg/hr (01/18/23 0441)   cefTRIAXone (ROCEPHIN)  IV Stopped (01/17/23 1456)   metronidazole 500 mg (01/18/23 0549)     LOS: 8 days   Time spent: 35 minutes  Amire Gossen, MS4 [facility]  If 7PM-7AM, please contact night-coverage [email or web address] 01/18/2023, 7:31 AM

## 2023-01-18 NOTE — Progress Notes (Signed)
01/18/2023 4:44 PM  Arrived to see patient with new development of SVT  HR 150-170.  Pt given adenosine 6 mg, no response, 12 mg given with brief slow down of HR to 80-90, then rapid rebound of HR to 150-160, 12 mg IV given again with no change in HR. Bolus amiodarone 150 mg x 1, then increased rate to 60 mg / h  per Dr Harrington Challenger.  BP holding stable at  128/70.  Pt developed temp of 102.   Murvin Natal MD

## 2023-01-19 ENCOUNTER — Telehealth: Payer: Self-pay | Admitting: *Deleted

## 2023-01-19 ENCOUNTER — Inpatient Hospital Stay (HOSPITAL_COMMUNITY): Payer: Medicare Other

## 2023-01-19 DIAGNOSIS — R509 Fever, unspecified: Secondary | ICD-10-CM | POA: Diagnosis not present

## 2023-01-19 DIAGNOSIS — I471 Supraventricular tachycardia, unspecified: Secondary | ICD-10-CM | POA: Diagnosis not present

## 2023-01-19 DIAGNOSIS — L03115 Cellulitis of right lower limb: Secondary | ICD-10-CM | POA: Diagnosis not present

## 2023-01-19 DIAGNOSIS — L03116 Cellulitis of left lower limb: Secondary | ICD-10-CM

## 2023-01-19 DIAGNOSIS — R6520 Severe sepsis without septic shock: Secondary | ICD-10-CM | POA: Diagnosis not present

## 2023-01-19 DIAGNOSIS — A419 Sepsis, unspecified organism: Secondary | ICD-10-CM | POA: Diagnosis not present

## 2023-01-19 LAB — CBC WITH DIFFERENTIAL/PLATELET
Abs Immature Granulocytes: 0.1 10*3/uL — ABNORMAL HIGH (ref 0.00–0.07)
Basophils Absolute: 0.1 10*3/uL (ref 0.0–0.1)
Basophils Relative: 1 %
Eosinophils Absolute: 1.2 10*3/uL — ABNORMAL HIGH (ref 0.0–0.5)
Eosinophils Relative: 8 %
HCT: 33.9 % — ABNORMAL LOW (ref 39.0–52.0)
Hemoglobin: 10.9 g/dL — ABNORMAL LOW (ref 13.0–17.0)
Immature Granulocytes: 1 %
Lymphocytes Relative: 7 %
Lymphs Abs: 1.1 10*3/uL (ref 0.7–4.0)
MCH: 28.9 pg (ref 26.0–34.0)
MCHC: 32.2 g/dL (ref 30.0–36.0)
MCV: 89.9 fL (ref 80.0–100.0)
Monocytes Absolute: 1.6 10*3/uL — ABNORMAL HIGH (ref 0.1–1.0)
Monocytes Relative: 10 %
Neutro Abs: 11.2 10*3/uL — ABNORMAL HIGH (ref 1.7–7.7)
Neutrophils Relative %: 73 %
Platelets: 244 10*3/uL (ref 150–400)
RBC: 3.77 MIL/uL — ABNORMAL LOW (ref 4.22–5.81)
RDW: 18.6 % — ABNORMAL HIGH (ref 11.5–15.5)
WBC: 15.3 10*3/uL — ABNORMAL HIGH (ref 4.0–10.5)
nRBC: 0 % (ref 0.0–0.2)

## 2023-01-19 LAB — RENAL FUNCTION PANEL
Albumin: 2.4 g/dL — ABNORMAL LOW (ref 3.5–5.0)
Anion gap: 17 — ABNORMAL HIGH (ref 5–15)
BUN: 23 mg/dL — ABNORMAL HIGH (ref 6–20)
CO2: 25 mmol/L (ref 22–32)
Calcium: 7.5 mg/dL — ABNORMAL LOW (ref 8.9–10.3)
Chloride: 91 mmol/L — ABNORMAL LOW (ref 98–111)
Creatinine, Ser: 8.14 mg/dL — ABNORMAL HIGH (ref 0.61–1.24)
GFR, Estimated: 7 mL/min — ABNORMAL LOW (ref >60)
Glucose, Bld: 89 mg/dL (ref 70–99)
Phosphorus: 2.5 mg/dL (ref 2.5–4.6)
Potassium: 3.4 mmol/L — ABNORMAL LOW (ref 3.5–5.1)
Sodium: 133 mmol/L — ABNORMAL LOW (ref 135–145)

## 2023-01-19 LAB — SEDIMENTATION RATE: Sed Rate: 51 mm/hr — ABNORMAL HIGH (ref 0–16)

## 2023-01-19 LAB — CULTURE, BLOOD (ROUTINE X 2)
Culture: NO GROWTH
Culture: NO GROWTH
Special Requests: ADEQUATE
Special Requests: ADEQUATE

## 2023-01-19 LAB — MAGNESIUM: Magnesium: 1.9 mg/dL (ref 1.7–2.4)

## 2023-01-19 LAB — C-REACTIVE PROTEIN: CRP: 19.3 mg/dL — ABNORMAL HIGH (ref <1.0)

## 2023-01-19 MED ORDER — LOPERAMIDE HCL 2 MG PO CAPS
2.0000 mg | ORAL_CAPSULE | ORAL | Status: DC | PRN
  Administered 2023-01-19: 2 mg via ORAL
  Filled 2023-01-19: qty 1

## 2023-01-19 MED ORDER — HEPARIN SODIUM (PORCINE) 1000 UNIT/ML DIALYSIS: 20.0000 [IU]/kg | INTRAMUSCULAR | Status: DC | PRN

## 2023-01-19 MED ORDER — LORAZEPAM 2 MG/ML IJ SOLN
0.5000 mg | Freq: Three times a day (TID) | INTRAMUSCULAR | Status: DC | PRN
  Administered 2023-01-19: 0.5 mg via INTRAVENOUS
  Filled 2023-01-19: qty 1

## 2023-01-19 MED ORDER — IOHEXOL 300 MG/ML  SOLN
75.0000 mL | Freq: Once | INTRAMUSCULAR | Status: AC | PRN
  Administered 2023-01-19: 75 mL via INTRAVENOUS

## 2023-01-19 MED ORDER — SODIUM CHLORIDE 0.9 % IV SOLN: INTRAVENOUS | Status: DC

## 2023-01-19 MED FILL — Medication: Qty: 1 | Status: AC

## 2023-01-19 NOTE — Procedures (Signed)
I was present at this dialysis session. I have reviewed the session itself and made appropriate changes.  Goal UF decreased from 2 to 1 L due to tachycardia and hypotension.  Now back on MWF schedule.  Next HD will be 01/21/23.  Pt feels well and denies any SOB.   Vital signs in last 24 hours:  Temp:  [98.3 F (36.8 C)-102 F (38.9 C)] 98.7 F (37.1 C) (03/06 0736) Pulse Rate:  [104-157] 116 (03/06 0736) Resp:  [13-44] 14 (03/06 0930) BP: (64-143)/(30-115) 89/59 (03/06 0930) SpO2:  [97 %-100 %] 97 % (03/06 0736) FiO2 (%):  [21 %] 21 % (03/05 2036) Weight:  [100.8 kg-100.9 kg] 100.8 kg (03/06 0736) Weight change: -0.1 kg Filed Weights   01/18/23 0727 01/19/23 0500 01/19/23 0736  Weight: 100.7 kg 100.9 kg 100.8 kg    Recent Labs  Lab 01/19/23 0418  NA 133*  K 3.4*  CL 91*  CO2 25  GLUCOSE 89  BUN 23*  CREATININE 8.14*  CALCIUM 7.5*  PHOS 2.5    Recent Labs  Lab 01/17/23 0318 01/18/23 0431 01/19/23 0418  WBC 14.7* 16.7* 15.3*  NEUTROABS 10.9* 12.5* 11.2*  HGB 11.5* 10.3* 10.9*  HCT 37.2* 32.0* 33.9*  MCV 93.2 89.6 89.9  PLT 269 238 244    Scheduled Meds:  aspirin EC  81 mg Oral q AM   atorvastatin  80 mg Oral QHS   calcium carbonate  1,500 mg Oral QHS   Chlorhexidine Gluconate Cloth  6 each Topical Q0600   Chlorhexidine Gluconate Cloth  6 each Topical Q0600   famotidine  20 mg Oral QHS   heparin  5,000 Units Subcutaneous Q8H   midodrine  15 mg Oral TID WC   pantoprazole  40 mg Oral Q breakfast   saccharomyces boulardii  250 mg Oral BID   tamsulosin  0.4 mg Oral QPM   Continuous Infusions:  sodium chloride 10 mL/hr at 01/18/23 0441   albumin human 25 g (01/19/23 0815)   amiodarone 60 mg/hr (01/19/23 0845)   ceFEPime (MAXIPIME) IV     metronidazole 500 mg (01/19/23 0651)   PRN Meds:.acetaminophen **OR** acetaminophen, albumin human, diphenhydrAMINE, heparin, heparin, lidocaine (PF), lidocaine-prilocaine, LORazepam, ondansetron **OR** ondansetron (ZOFRAN) IV,  mouth rinse, oxyCODONE, pentafluoroprop-tetrafluoroeth   Donetta Potts,  MD 01/19/2023, 9:42 AM

## 2023-01-19 NOTE — Progress Notes (Signed)
HEMODIALYSIS TREATMENT NOTE:  3.5 hour low heparin treatment completed using left forearm AVF (15g/antegrade). Goal met: 1 liter removed.  Albumin 25g was given twice for hypotension.  HR 85-125 during treatment and ^ 130s, non-sustained after blood return.  Hemostasis was achieved in 10 minutes.  Post-dialysis:  01/19/23 1200  Vital Signs  Temp 99.1 F (37.3 C)  Temp Source Oral  Pulse Rate (!) 135  Pulse Rate Source Monitor  Resp (!) 22  BP 116/86  BP Location Right Leg  BP Method Automatic  Patient Position (if appropriate) Lying  Oxygen Therapy  SpO2 97 %  O2 Device Room Air  Dialysis Weight  Weight 99.9 kg  Type of Weight Post-Dialysis  Post Treatment  Dialyzer Clearance Clear  Duration of HD Treatment -hour(s) 3.5 hour(s)  Hemodialysis Intake (mL) 0 mL  Liters Processed 84  Fluid Removed (mL) 1000 mL  Tolerated HD Treatment Yes  Post-Hemodialysis Comments Goal met  Fistula / Graft Left Forearm Arteriovenous fistula  No placement date or time found.   Placed prior to admission: Yes  Orientation: Left  Access Location: Forearm  Access Type: Arteriovenous fistula  Fistula / Graft Assessment Thrill;Bruit  Status Patent    Rockwell Alexandria, RN AP [facility]

## 2023-01-19 NOTE — Telephone Encounter (Signed)
Call received from [facility] with [facility] requesting OP note and wound notes re: new VAC order placed for patient. Advised her pt's surgery was canceled and has not been rescheduled at this time due to unrelated health concerns. She states she will cancel this order and will need new order when [facility] is needed.
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
## 2023-01-20 ENCOUNTER — Encounter (HOSPITAL_COMMUNITY): Payer: Self-pay | Admitting: Family Medicine

## 2023-01-20 ENCOUNTER — Inpatient Hospital Stay (HOSPITAL_COMMUNITY): Payer: Medicare Other | Admitting: Anesthesiology

## 2023-01-20 ENCOUNTER — Inpatient Hospital Stay (HOSPITAL_COMMUNITY): Payer: Medicare Other

## 2023-01-20 ENCOUNTER — Other Ambulatory Visit (HOSPITAL_COMMUNITY): Payer: Self-pay | Admitting: *Deleted

## 2023-01-20 ENCOUNTER — Encounter (HOSPITAL_COMMUNITY)
Admission: EM | Disposition: A | Discharge status: Home Health | Payer: Self-pay | Source: Home / Self Care | Attending: Internal Medicine

## 2023-01-20 DIAGNOSIS — I340 Nonrheumatic mitral (valve) insufficiency: Secondary | ICD-10-CM | POA: Diagnosis not present

## 2023-01-20 DIAGNOSIS — A419 Sepsis, unspecified organism: Secondary | ICD-10-CM | POA: Diagnosis not present

## 2023-01-20 DIAGNOSIS — R509 Fever, unspecified: Secondary | ICD-10-CM

## 2023-01-20 DIAGNOSIS — S81802A Unspecified open wound, left lower leg, initial encounter: Secondary | ICD-10-CM

## 2023-01-20 DIAGNOSIS — I361 Nonrheumatic tricuspid (valve) insufficiency: Secondary | ICD-10-CM

## 2023-01-20 DIAGNOSIS — I471 Supraventricular tachycardia, unspecified: Secondary | ICD-10-CM

## 2023-01-20 DIAGNOSIS — I132 Hypertensive heart and chronic kidney disease with heart failure and with stage 5 chronic kidney disease, or end stage renal disease: Secondary | ICD-10-CM

## 2023-01-20 DIAGNOSIS — N186 End stage renal disease: Secondary | ICD-10-CM

## 2023-01-20 DIAGNOSIS — D631 Anemia in chronic kidney disease: Secondary | ICD-10-CM

## 2023-01-20 DIAGNOSIS — R6520 Severe sepsis without septic shock: Secondary | ICD-10-CM | POA: Diagnosis not present

## 2023-01-20 DIAGNOSIS — Z992 Dependence on renal dialysis: Secondary | ICD-10-CM

## 2023-01-20 DIAGNOSIS — I2510 Atherosclerotic heart disease of native coronary artery without angina pectoris: Secondary | ICD-10-CM

## 2023-01-20 DIAGNOSIS — I5032 Chronic diastolic (congestive) heart failure: Secondary | ICD-10-CM

## 2023-01-20 HISTORY — PX: TEE WITHOUT CARDIOVERSION: SHX5443

## 2023-01-20 HISTORY — DX: Supraventricular tachycardia, unspecified: I47.10

## 2023-01-20 LAB — RENAL FUNCTION PANEL
Albumin: 2.6 g/dL — ABNORMAL LOW (ref 3.5–5.0)
Anion gap: 15 (ref 5–15)
BUN: 16 mg/dL (ref 6–20)
CO2: 27 mmol/L (ref 22–32)
Calcium: 7.5 mg/dL — ABNORMAL LOW (ref 8.9–10.3)
Chloride: 92 mmol/L — ABNORMAL LOW (ref 98–111)
Creatinine, Ser: 6.25 mg/dL — ABNORMAL HIGH (ref 0.61–1.24)
GFR, Estimated: 10 mL/min — ABNORMAL LOW (ref >60)
Glucose, Bld: 92 mg/dL (ref 70–99)
Phosphorus: 1.9 mg/dL — ABNORMAL LOW (ref 2.5–4.6)
Potassium: 3.1 mmol/L — ABNORMAL LOW (ref 3.5–5.1)
Sodium: 134 mmol/L — ABNORMAL LOW (ref 135–145)

## 2023-01-20 LAB — CBC
HCT: 31.1 % — ABNORMAL LOW (ref 39.0–52.0)
Hemoglobin: 10.1 g/dL — ABNORMAL LOW (ref 13.0–17.0)
MCH: 28.5 pg (ref 26.0–34.0)
MCHC: 32.5 g/dL (ref 30.0–36.0)
MCV: 87.6 fL (ref 80.0–100.0)
Platelets: 239 10*3/uL (ref 150–400)
RBC: 3.55 MIL/uL — ABNORMAL LOW (ref 4.22–5.81)
RDW: 18.5 % — ABNORMAL HIGH (ref 11.5–15.5)
WBC: 12.5 10*3/uL — ABNORMAL HIGH (ref 4.0–10.5)
nRBC: 0 % (ref 0.0–0.2)

## 2023-01-20 LAB — CMV IGM: CMV IgM: <30 AU/mL (ref 0.0–29.9)

## 2023-01-20 LAB — ECHO TEE

## 2023-01-20 LAB — MAGNESIUM: Magnesium: 1.7 mg/dL (ref 1.7–2.4)

## 2023-01-20 SURGERY — ECHOCARDIOGRAM, TRANSESOPHAGEAL
Anesthesia: General

## 2023-01-20 MED ORDER — PROPOFOL 10 MG/ML IV BOLUS
INTRAVENOUS | Status: DC | PRN
  Administered 2023-01-20: 50 mg via INTRAVENOUS
  Administered 2023-01-20: 20 mg via INTRAVENOUS

## 2023-01-20 MED ORDER — POTASSIUM CHLORIDE CRYS ER 20 MEQ PO TBCR
20.0000 meq | EXTENDED_RELEASE_TABLET | Freq: Once | ORAL | Status: AC
  Administered 2023-01-20: 20 meq via ORAL
  Filled 2023-01-20: qty 1

## 2023-01-20 MED ORDER — LIDOCAINE VISCOUS HCL 2 % MT SOLN
OROMUCOSAL | Status: AC
  Filled 2023-01-20: qty 15

## 2023-01-20 MED ORDER — LIDOCAINE HCL 1 % IJ SOLN
INTRAMUSCULAR | Status: DC | PRN
  Administered 2023-01-20: 50 mg via INTRADERMAL

## 2023-01-20 MED ORDER — PROPOFOL 500 MG/50ML IV EMUL
INTRAVENOUS | Status: DC | PRN
  Administered 2023-01-20: 50 ug/kg/min via INTRAVENOUS
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
## 2023-01-20 NOTE — CV Procedure (Signed)
CV Procedure Note  Procedure: Transesophageal echocardiogram Physician: Dr Carlyle Dolly MD Indication: Fever unknown origin   Patient was brought to the procedure suite after appropriate consent was obtained. The posterior oropharynx was anesthesized with 2% viscous lidocaine and bite block placed. He was placed in the left lateral decubitis position. Sedation achieved with the assistance of anesthesiology, for details please refer to there documentation. TEE probe intubated into the esophagus without difficultly and several views obtained. Please see TEE report for full findings, in summary no evidence of vegetation or endocarditis. Cardiopulmonary monitoring was performed throughout the procedure, he tolerated well without complications.     Carlyle Dolly MD

## 2023-01-20 NOTE — Progress Notes (Signed)
*  PRELIMINARY RESULTS* Echocardiogram Echocardiogram Transesophageal has been performed.  [name] 01/20/2023, 2:24 PM

## 2023-01-20 NOTE — Transfer of Care (Signed)
Immediate Anesthesia Transfer of Care Note  Patient: [name]  Procedure(s) Performed: TRANSESOPHAGEAL ECHOCARDIOGRAM (TEE)  Patient Location: PACU  Anesthesia Type:General  Level of Consciousness: awake  Airway & Oxygen Therapy: Patient Spontanous Breathing  Post-op Assessment: Report given to RN  Post vital signs: Reviewed and stable  Last Vitals:  Vitals Value Taken Time  BP    Temp    Pulse    Resp    SpO2      Last Pain:  Vitals:   01/20/23 0951  TempSrc:   PainSc: 0-No pain      Patients Stated Pain Goal: 0 ([id number])  Complications: No notable events documented.

## 2023-01-20 NOTE — Anesthesia Preprocedure Evaluation (Signed)
Anesthesia Evaluation  Patient identified by MRN, date of birth, ID band Patient awake    Reviewed: Allergy & Precautions, H&P , NPO status , Patient's Chart, lab work & pertinent test results, reviewed documented beta blocker date and time   Airway Mallampati: II  TM Distance: >3 FB Neck ROM: full    Dental no notable dental hx. (+) Missing   Pulmonary sleep apnea , pneumonia   Pulmonary exam normal breath sounds clear to auscultation       Cardiovascular Exercise Tolerance: Good hypertension, + CAD and +CHF   Rhythm:regular Rate:Normal     Neuro/Psych negative neurological ROS  negative psych ROS   GI/Hepatic Neg liver ROS,GERD  Medicated,,  Endo/Other  negative endocrine ROS    Renal/GU ESRF and DialysisRenal disease  negative genitourinary   Musculoskeletal   Abdominal   Peds  Hematology  (+) Blood dyscrasia, anemia   Anesthesia Other Findings   Reproductive/Obstetrics negative OB ROS                             Anesthesia Physical Anesthesia Plan  ASA: 4 and emergent  Anesthesia Plan: General   Post-op Pain Management:    Induction:   PONV Risk Score and Plan: Propofol infusion  Airway Management Planned:   Additional Equipment:   Intra-op Plan:   Post-operative Plan:   Informed Consent: I have reviewed the patients History and Physical, chart, labs and discussed the procedure including the risks, benefits and alternatives for the proposed anesthesia with the patient or authorized representative who has indicated his/her understanding and acceptance.     Dental Advisory Given  Plan Discussed with: CRNA  Anesthesia Plan Comments:        Anesthesia Quick Evaluation

## 2023-01-20 NOTE — Progress Notes (Signed)
Tele shows afib low 100s, very brief episodes of SVT. Continue amio gtt, will try lowering rate to '30mg'$ /hr. Rates should improve as systemic illness improves, ongoing fevers of unclear etiology. ID and primary team have requested a TEE today to look for occult source of infection, plan for procedure today.   Zandra Abts MD
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
## 2023-01-20 NOTE — Progress Notes (Signed)
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   Chlorhexidine Gluconate Cloth  6 each Topical Q0600   Chlorhexidine Gluconate Cloth  6 each Topical Q0600   famotidine  20 mg Oral QHS   heparin  5,000 Units Subcutaneous Q8H   midodrine  15 mg Oral TID WC   pantoprazole  40 mg Oral Q breakfast   potassium chloride  20 mEq Oral Once   saccharomyces boulardii  250 mg Oral BID   tamsulosin  0.4 mg Oral QPM   Continuous Infusions:  sodium chloride 10 mL/hr at 01/18/23 0441   sodium chloride Stopped (01/19/23 2027)   amiodarone 60 mg/hr (01/20/23 0255)     LOS: 10 days   Time spent: 35 minutes  Cliffton Spradley, MS4 [facility]  If 7PM-7AM, please contact night-coverage [email or web address] 01/20/2023, 8:05 AM

## 2023-01-20 NOTE — Progress Notes (Signed)
Patient has returned from TEE procedure late this afternoon. Blood pressure reading of 81/45, patient asymptomatic and alert and oriented x4. Patient had not taken the noon midodrine due to being off the floor for TEE. Dr Manuella Ghazi made aware. Patient took the scheduled 5pm midodrine at 1613. Dr Manuella Ghazi aware. Will continue to monitor.

## 2023-01-21 DIAGNOSIS — R6520 Severe sepsis without septic shock: Secondary | ICD-10-CM | POA: Diagnosis not present

## 2023-01-21 DIAGNOSIS — A419 Sepsis, unspecified organism: Secondary | ICD-10-CM | POA: Diagnosis not present

## 2023-01-21 LAB — IRON AND TIBC
Iron: 18 ug/dL — ABNORMAL LOW (ref 45–182)
Saturation Ratios: 13 % — ABNORMAL LOW (ref 17.9–39.5)
TIBC: 142 ug/dL — ABNORMAL LOW (ref 250–450)
UIBC: 124 ug/dL

## 2023-01-21 LAB — RETICULOCYTES
Immature Retic Fract: 22.9 % — ABNORMAL HIGH (ref 2.3–15.9)
RBC.: 3.76 MIL/uL — ABNORMAL LOW (ref 4.22–5.81)
Retic Count, Absolute: 23.3 10*3/uL (ref 19.0–186.0)
Retic Ct Pct: 0.6 % (ref 0.4–3.1)

## 2023-01-21 LAB — CMV DNA, QUANTITATIVE, PCR
CMV DNA Quant: NEGATIVE IU/mL
Log10 CMV Qn DNA Pl: UNDETERMINED log10 IU/mL

## 2023-01-21 LAB — CBC
HCT: 30.6 % — ABNORMAL LOW (ref 39.0–52.0)
Hemoglobin: 9.8 g/dL — ABNORMAL LOW (ref 13.0–17.0)
MCH: 28.4 pg (ref 26.0–34.0)
MCHC: 32 g/dL (ref 30.0–36.0)
MCV: 88.7 fL (ref 80.0–100.0)
Platelets: 253 10*3/uL (ref 150–400)
RBC: 3.45 MIL/uL — ABNORMAL LOW (ref 4.22–5.81)
RDW: 18.7 % — ABNORMAL HIGH (ref 11.5–15.5)
WBC: 11.2 10*3/uL — ABNORMAL HIGH (ref 4.0–10.5)
nRBC: 0 % (ref 0.0–0.2)

## 2023-01-21 LAB — RENAL FUNCTION PANEL
Albumin: 2.4 g/dL — ABNORMAL LOW (ref 3.5–5.0)
Anion gap: 16 — ABNORMAL HIGH (ref 5–15)
BUN: 23 mg/dL — ABNORMAL HIGH (ref 6–20)
CO2: 25 mmol/L (ref 22–32)
Calcium: 7.5 mg/dL — ABNORMAL LOW (ref 8.9–10.3)
Chloride: 94 mmol/L — ABNORMAL LOW (ref 98–111)
Creatinine, Ser: 8.32 mg/dL — ABNORMAL HIGH (ref 0.61–1.24)
GFR, Estimated: 7 mL/min — ABNORMAL LOW (ref >60)
Glucose, Bld: 83 mg/dL (ref 70–99)
Phosphorus: 2.6 mg/dL (ref 2.5–4.6)
Potassium: 3.3 mmol/L — ABNORMAL LOW (ref 3.5–5.1)
Sodium: 135 mmol/L (ref 135–145)

## 2023-01-21 LAB — CK: Total CK: 47 U/L — ABNORMAL LOW (ref 49–397)

## 2023-01-21 LAB — C-REACTIVE PROTEIN: CRP: 12.9 mg/dL — ABNORMAL HIGH (ref <1.0)

## 2023-01-21 LAB — VITAMIN B12: Vitamin B-12: 1669 pg/mL — ABNORMAL HIGH (ref 180–914)

## 2023-01-21 LAB — MAGNESIUM: Magnesium: 1.7 mg/dL (ref 1.7–2.4)

## 2023-01-21 LAB — SEDIMENTATION RATE: Sed Rate: 60 mm/hr — ABNORMAL HIGH (ref 0–16)

## 2023-01-21 LAB — FOLATE: Folate: 9.8 ng/mL (ref >5.9)

## 2023-01-21 LAB — FERRITIN: Ferritin: 349 ng/mL — ABNORMAL HIGH (ref 24–336)

## 2023-01-21 MED ORDER — MIDODRINE HCL 5 MG PO TABS
10.0000 mg | ORAL_TABLET | Freq: Once | ORAL | Status: AC
  Administered 2023-01-21: 10 mg via ORAL
  Filled 2023-01-21: qty 2

## 2023-01-21 MED ORDER — DARBEPOETIN ALFA 100 MCG/0.5ML IJ SOSY
100.0000 ug | PREFILLED_SYRINGE | INTRAMUSCULAR | Status: DC
  Administered 2023-01-21: 100 ug via SUBCUTANEOUS
  Filled 2023-01-21: qty 0.5

## 2023-01-21 MED ORDER — ALBUMIN HUMAN 25 % IV SOLN
25.0000 g | INTRAVENOUS | Status: AC | PRN
  Administered 2023-01-21: 25 g via INTRAVENOUS

## 2023-01-21 MED ORDER — SODIUM CHLORIDE 0.9 % IV SOLN
200.0000 mg | Freq: Once | INTRAVENOUS | Status: AC
  Administered 2023-01-21: 200 mg via INTRAVENOUS
  Filled 2023-01-21: qty 10

## 2023-01-21 NOTE — Progress Notes (Signed)
ID Brief Note  Afebrile for more than 24 hrs  WBC down trending  Remains off abtx since 3/6  No new concerns per chart  On midodrine for chronic hypotension  Cardiology following for tachycardia and on amiodarone No concerns for infection at the LLE wound per General surgery   Fu blood cx 3/7 ( taken off abtx) pending  CMV IgM negative, DNA pending  SPEP negative   Cannot find source of infection for fevers so far  Hold off abtx  Dr Candiss Norse covering this weekend, please call with active questions   Rosiland Oz, MD Infectious Disease Physician [facility] [address] [place], [place] [address] Phone: [phone]  Fax: [phone]

## 2023-01-21 NOTE — Progress Notes (Signed)
  HEMODIALYSIS TREATMENT NOTE:  Pt is withdrawn today; usually he laughs and jokes with all staff.  "I've been here too long for just one thing [HR] and I'm ready to go home."  Reports he had some SVT today but non-sustained.  Says he doesn't understand why nephrologist wants to remove 2L with HD when cardiologist tells him he's dehydrated and to increase oral fluid intake.  We paged Dr. Marval Regal, who lowered goal to 1L.  HR^ to 135-142 halfway through HD.  Albumin 25g was given for low BP (asymptomatic).  Tachycardia persisted and UF was paused and blood flow rate was lowered with no effect.  UF was resumed.  Pt spontaneously converted to NSR 69 but this lasted only a few minutes before he was back in Afib 130s.    3.75 hour session completed.  Venofer 200mg  given.  1 liter was removed.  All blood was returned.  Acetaminophen 650 was given at the end of HD for post-temp 100.6.  POST DIALYSIS:  01/21/23 1900  Vitals  Temp (!) 100.6 F (38.1 C)  Temp Source Oral  BP 103/62  MAP (mmHg) 72  BP Location Right Leg  BP Method Automatic  Patient Position (if appropriate) Lying  Pulse Rate (!) 145  Pulse Rate Source Monitor  ECG Heart Rate (!) 142  Resp (!) 22  Oxygen Therapy  SpO2 99 %  O2 Device Room Air  Post Treatment  Dialyzer Clearance Lightly streaked  Duration of HD Treatment -hour(s) 3.75 hour(s)  Hemodialysis Intake (mL) 100 mL  Liters Processed 90  Fluid Removed (mL) 1000 mL  Tolerated HD Treatment Yes  Post-Hemodialysis Comments Goal met  Fistula / Graft Left Forearm Arteriovenous fistula  No placement date or time found.   Placed prior to admission: Yes  Orientation: Left  Access Location: Forearm  Access Type: Arteriovenous fistula  Fistula / Graft Assessment Thrill;Bruit  Status Patent    Rockwell Alexandria, RN AP [facility]

## 2023-01-21 NOTE — Progress Notes (Signed)
[name], male   DOB: [birth year], 57 y.o.   MRN: [id number] S: No complaints and tolerated TEE without issue yesterday. O:BP 106/62   Pulse 98   Temp 98.4 F (36.9 C) (Oral)   Resp (!) 24   Ht '6\' 1"'$  (1.854 m)   Wt 101 kg   SpO2 91%   BMI 29.38 kg/m   Intake/Output Summary (Last 24 hours) at 01/21/2023 0918 Last data filed at 01/21/2023 0735 Gross per 24 hour  Intake 1102.16 ml  Output 0 ml  Net 1102.16 ml   Intake/Output: I/O last 3 completed shifts: In: 1800.7 [P.O.:300; I.V.:1500.7] Out: 0   Intake/Output this shift:  Total I/O In: 185.8 [I.V.:185.8] Out: -  Weight change:  Gen: NAD CVS: RRR Resp: CTA Abd: +BS, soft, NT/ND Ext: no edema, LAVF +T/B  Recent Labs  Lab 01/15/23 0430 01/16/23 0352 01/17/23 0318 01/18/23 0431 01/19/23 0418 01/20/23 0344 01/21/23 0452  NA 131* 132* 132* 132* 133* 134* 135  K 3.9 4.1 3.9 3.3* 3.4* 3.1* 3.3*  CL 92* 91* 90* 92* 91* 92* 94*  CO2 '22 25 23 '$ 20* '25 27 25  '$ GLUCOSE 115* 84 92 90 89 92 83  BUN 41* 24* 33* 41* 23* 16 23*  CREATININE 13.15* 8.95* 10.61* 12.31* 8.14* 6.25* 8.32*  ALBUMIN 2.2*  2.2* 2.5* 2.7* 2.3* 2.4* 2.6* 2.4*  CALCIUM 7.6* 7.7* 7.9* 7.4* 7.5* 7.5* 7.5*  PHOS 3.2 2.4* 3.0 3.2 2.5 1.9* 2.6  AST 24  --   --   --   --   --   --   ALT 7  --   --   --   --   --   --    Liver Function Tests: Recent Labs  Lab 01/15/23 0430 01/16/23 0352 01/19/23 0418 01/20/23 0344 01/21/23 0452  AST 24  --   --   --   --   ALT 7  --   --   --   --   ALKPHOS 204*  --   --   --   --   BILITOT 0.6  --   --   --   --   PROT 6.0*  --   --   --   --   ALBUMIN 2.2*  2.2*   < > 2.4* 2.6* 2.4*   < > = values in this interval not displayed.   No results for input(s): "LIPASE", "AMYLASE" in the last 168 hours. No results for input(s): "AMMONIA" in the last 168 hours. CBC: Recent Labs  Lab 01/17/23 0318 01/18/23 0431 01/19/23 0418 01/20/23 0344 01/21/23 0452  WBC 14.7* 16.7* 15.3* 12.5* 11.2*  NEUTROABS  10.9* 12.5* 11.2*  --   --   HGB 11.5* 10.3* 10.9* 10.1* 9.8*  HCT 37.2* 32.0* 33.9* 31.1* 30.6*  MCV 93.2 89.6 89.9 87.6 88.7  PLT 269 238 244 239 253   Cardiac Enzymes: Recent Labs  Lab 01/21/23 0452  CKTOTAL 47*   CBG: No results for input(s): "GLUCAP" in the last 168 hours.  Iron Studies: No results for input(s): "IRON", "TIBC", "TRANSFERRIN", "FERRITIN" in the last 72 hours. Studies/Results: ECHO TEE  Result Date: 01/20/2023    TRANSESOPHOGEAL ECHO REPORT   Patient Name:   [name] Date of Exam: 01/20/2023 Medical Rec #:  [id number]      Height:       73.0 in Accession #:    [id number]     Weight:  222.7 lb Date of Birth:  June 28, 1966      BSA:          2.252 m Patient Age:    57 years       BP:           78/41 mmHg Patient Gender: M              HR:           96 bpm. Exam Location:  [facility] Procedure: Transesophageal Echo, Cardiac Doppler and Color Doppler Indications:    Fever, unknown origin  History:        Patient has prior history of Echocardiogram examinations, most                 recent 01/16/2023. CHF, Arrythmias:Atrial Fibrillation; Risk                 Factors:Dyslipidemia and Hypertension. ESRD on Dialysis. Sleep                 apnea (From Hx).  Sonographer:    Alvino Chapel RCS Referring Phys: [id number] [place] PROCEDURE: The transesophogeal probe was passed without difficulty through the esophogus of the patient. Sedation performed by different physician. The patient developed no complications during the procedure.  IMPRESSIONS  1. Left ventricular ejection fraction, by estimation, is 65 to 70%. The left ventricle has normal function.  2. Right ventricular systolic function is mildly reduced. The right ventricular size is mildly enlarged.  3. Left atrial size was moderately dilated. No left atrial/left atrial appendage thrombus was detected.  4. Right atrial size was moderately dilated.  5. The mitral valve is abnormal. Mild mitral valve regurgitation. No  evidence of mitral stenosis.  6. The tricuspid valve is abnormal.  7. The aortic valve is tricuspid. There is mild calcification of the aortic valve. There is mild thickening of the aortic valve. Aortic valve regurgitation is not visualized. No aortic stenosis is present. Conclusion(s)/Recommendation(s): No evidence of vegetation/infective endocarditis on this transesophageael echocardiogram. FINDINGS  Left Ventricle: Left ventricular ejection fraction, by estimation, is 65 to 70%. The left ventricle has normal function. The left ventricular internal cavity size was normal in size. Right Ventricle: The right ventricular size is mildly enlarged. Right vetricular wall thickness was not well visualized. Right ventricular systolic function is mildly reduced. Left Atrium: Left atrial size was moderately dilated. No left atrial/left atrial appendage thrombus was detected. Right Atrium: Right atrial size was moderately dilated. Pericardium: There is no evidence of pericardial effusion. Mitral Valve: The mitral valve is abnormal. Mild mitral valve regurgitation. No evidence of mitral valve stenosis. Tricuspid Valve: The tricuspid valve is abnormal. Tricuspid valve regurgitation is mild . No evidence of tricuspid stenosis. Aortic Valve: The aortic valve is tricuspid. There is mild calcification of the aortic valve. There is mild thickening of the aortic valve. There is mild aortic valve annular calcification. Aortic valve regurgitation is not visualized. No aortic stenosis  is present. Pulmonic Valve: The pulmonic valve was not well visualized. Pulmonic valve regurgitation is trivial. Aorta: The aortic root is normal in size and structure. IAS/Shunts: No atrial level shunt detected by color flow Doppler.   AORTA Ao Root diam: 3.30 cm Carlyle Dolly MD Electronically signed by Carlyle Dolly MD Signature Date/Time: 01/20/2023/2:54:41 PM    Final    CT TIBIA FIBULA LEFT W CONTRAST  Result Date: 01/19/2023 CLINICAL DATA:   Lower leg pain EXAM: CT OF THE LOWER LEFT EXTREMITY WITH CONTRAST  TECHNIQUE: Multidetector CT imaging of the lower left extremity was performed according to the standard protocol following intravenous contrast administration. RADIATION DOSE REDUCTION: This exam was performed according to the departmental dose-optimization program which includes automated exposure control, adjustment of the mA and/or kV according to patient size and/or use of iterative reconstruction technique. CONTRAST:  23m OMNIPAQUE IOHEXOL 300 MG/ML  SOLN COMPARISON:  Radiographs 01/10/2023 FINDINGS: Bones/Joint/Cartilage No acute bony findings. We partially include degenerative findings in the midfoot and along the Chopart joint. Small fragmented spur along the inferior pole of the patella along the patellar tendon. Ligaments Suboptimally assessed by CT. Muscles and Tendons Convex anterior margin of the distal Achilles tendon favors distal Achilles tendinopathy. Otherwise unremarkable. Soft tissues Substantial diffuse arterial atherosclerotic vascular calcifications in the calf. Subcutaneous edema along the calf generally more confluent posterolaterally except in the distal calf approaching the ankle where there is anterior subcutaneous thickening along with cutaneous irregularity and possible blistering, correlate with visual inspection. This tracks just superficial to the extensor tendons. Cutaneous irregularity and small gas density is along the cutaneous margin but without a well-defined drainable abscess. IMPRESSION: 1. Subcutaneous edema along the calf generally more confluent posterolaterally except in the distal calf approaching the ankle where there is anterior subcutaneous thickening along with cutaneous irregularity and small gas density along the cutaneous margin but without a well-defined drainable abscess. 2. Substantial diffuse arterial atherosclerotic calcifications in the calf. 3. Convex anterior margin of the distal Achilles  tendon favors distal Achilles tendinopathy. 4. Partially visualized degenerative findings in the midfoot and along the Chopart joint. 5. Small fragmented spur along the inferior pole of the patella along the patellar tendon. Electronically Signed   By: WVan ClinesM.D.   On: 01/19/2023 17:43    aspirin EC  81 mg Oral q AM   atorvastatin  80 mg Oral QHS   calcium carbonate  1,500 mg Oral QHS   Chlorhexidine Gluconate Cloth  6 each Topical Q0600   Chlorhexidine Gluconate Cloth  6 each Topical Q0600   famotidine  20 mg Oral QHS   heparin  5,000 Units Subcutaneous Q8H   midodrine  15 mg Oral TID WC   pantoprazole  40 mg Oral Q breakfast   saccharomyces boulardii  250 mg Oral BID   tamsulosin  0.4 mg Oral QPM    BMET    Component Value Date/Time   NA 135 01/21/2023 0452   K 3.3 (L) 01/21/2023 0452   CL 94 (L) 01/21/2023 0452   CO2 25 01/21/2023 0452   GLUCOSE 83 01/21/2023 0452   BUN 23 (H) 01/21/2023 0452   CREATININE 8.32 (H) 01/21/2023 0452   CALCIUM 7.5 (L) 01/21/2023 0452   GFRNONAA 7 (L) 01/21/2023 0452   GFRAA 31 (L) 11/25/2016 0549   CBC    Component Value Date/Time   WBC 11.2 (H) 01/21/2023 0452   RBC 3.76 (L) 01/21/2023 0757   RBC 3.45 (L) 01/21/2023 0452   HGB 9.8 (L) 01/21/2023 0452   HCT 30.6 (L) 01/21/2023 0452   PLT 253 01/21/2023 0452   MCV 88.7 01/21/2023 0452   MCH 28.4 01/21/2023 0452   MCHC 32.0 01/21/2023 0452   RDW 18.7 (H) 01/21/2023 0452   LYMPHSABS 1.1 01/19/2023 0418   MONOABS 1.6 (H) 01/19/2023 0418   EOSABS 1.2 (H) 01/19/2023 0418   BASOSABS 0.1 01/19/2023 0418    [facility]- 15 gauge needles 400 BFR/500 DFR- 2/2.5  bath, temp 36.5- gets heparin 1000 load, then 200 per hour, also on venofer 50 per week EDW 94.5    Assessment/Plan:   FUO/Sepsis - unclear source, cultures negative.  TEE negative for culture negative SBE on 01/20/23.  Afebrile today and repeat blood cultures sent yesterday.  CT  of left leg without evidence of drainable abscess.  Antibiotics on hold for now. ESRD - continue with HD on MWF schedule HTN/volume - pt hypotensive and UF limited due to tachycardia and hypotension.  Will plan for HD today with IV albumin and high dose midodrine.  Continue with MWF schedule. 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 PSVT- cardiology following and on IV amiodarone.  Per Dr. Harl Bowie. P atrial fibrillation - s/p watchman device due to h/o GIB. On IV amio as above.  Donetta Potts, MD [facility]  Pt will not be seen this weekend but chart and labs will be reviewed remotely and on-call coverage will be available if needed.

## 2023-01-21 NOTE — Care Management Important Message (Signed)
Important Message  Patient Details  Name: [name] MRN: [id number] Date of Birth: Jul 20, 1966   Medicare Important Message Given:  Yes     Tommy Medal 01/21/2023, 12:04 PM

## 2023-01-21 NOTE — Progress Notes (Signed)
Progress Note  Patient Name: [name] Date of Encounter: 01/21/2023  Primary Cardiologist: None  Subjective   Telemetry reviewed, heart rate stayed in 130s.  Patient is asymptomatic, denied any palpitations, SOB, chest pain.  He said he feels extremely thirsty.  Spoke with nurse who said his heart rate goes up before dialysis.   Inpatient Medications    Scheduled Meds:  aspirin EC  81 mg Oral q AM   atorvastatin  80 mg Oral QHS   calcium carbonate  1,500 mg Oral QHS   Chlorhexidine Gluconate Cloth  6 each Topical Q0600   Chlorhexidine Gluconate Cloth  6 each Topical Q0600   famotidine  20 mg Oral QHS   heparin  5,000 Units Subcutaneous Q8H   midodrine  15 mg Oral TID WC   pantoprazole  40 mg Oral Q breakfast   saccharomyces boulardii  250 mg Oral BID   tamsulosin  0.4 mg Oral QPM   Continuous Infusions:  sodium chloride 10 mL/hr at 01/20/23 1239   amiodarone 30 mg/hr (01/21/23 0735)   PRN Meds: acetaminophen **OR** acetaminophen, diphenhydrAMINE, heparin, heparin, lidocaine (PF), lidocaine-prilocaine, loperamide, LORazepam, LORazepam, ondansetron **OR** ondansetron (ZOFRAN) IV, mouth rinse, oxyCODONE, pentafluoroprop-tetrafluoroeth   Vital Signs    Vitals:   01/21/23 0800 01/21/23 0806 01/21/23 0900 01/21/23 1000  BP: 106/62  (!) 96/56 (!) 92/45  Pulse:      Resp: (!) 24  (!) 22 (!) 21  Temp:  98.4 F (36.9 C)    TempSrc:  Oral    SpO2:      Weight:      Height:        Intake/Output Summary (Last 24 hours) at 01/21/2023 1025 Last data filed at 01/21/2023 0735 Gross per 24 hour  Intake 1102.16 ml  Output 0 ml  Net 1102.16 ml   Filed Weights   01/19/23 0736 01/19/23 1200 01/20/23 0600  Weight: 100.8 kg 99.9 kg 101 kg    Telemetry     Personally reviewed, heart rates stayed in 130s overnight as being in SVT.  ECG    EKG reviewed today, and is with a short RP tachycardia, HR 130s.  Physical Exam   GEN: No acute distress.   Neck: No  JVD. Cardiac: RRR, no murmur, rub, or gallop.  Respiratory: Nonlabored. Clear to auscultation bilaterally. GI: Soft, nontender, bowel sounds present. MS: No edema; No deformity. Neuro:  Nonfocal. Psych: Alert and oriented x 3. Normal affect.  Labs    Chemistry Recent Labs  Lab 01/15/23 0430 01/16/23 0352 01/19/23 0418 01/20/23 0344 01/21/23 0452  NA 131*   < > 133* 134* 135  K 3.9   < > 3.4* 3.1* 3.3*  CL 92*   < > 91* 92* 94*  CO2 22   < > '25 27 25  '$ GLUCOSE 115*   < > 89 92 83  BUN 41*   < > 23* 16 23*  CREATININE 13.15*   < > 8.14* 6.25* 8.32*  CALCIUM 7.6*   < > 7.5* 7.5* 7.5*  PROT 6.0*  --   --   --   --   ALBUMIN 2.2*  2.2*   < > 2.4* 2.6* 2.4*  AST 24  --   --   --   --   ALT 7  --   --   --   --   ALKPHOS 204*  --   --   --   --   BILITOT 0.6  --   --   --   --  GFRNONAA 4*   < > 7* 10* 7*  ANIONGAP 17*   < > 17* 15 16*   < > = values in this interval not displayed.     Hematology Recent Labs  Lab 01/19/23 0418 01/20/23 0344 01/21/23 0452 01/21/23 0757  WBC 15.3* 12.5* 11.2*  --   RBC 3.77* 3.55* 3.45* 3.76*  HGB 10.9* 10.1* 9.8*  --   HCT 33.9* 31.1* 30.6*  --   MCV 89.9 87.6 88.7  --   MCH 28.9 28.5 28.4  --   MCHC 32.2 32.5 32.0  --   RDW 18.6* 18.5* 18.7*  --   PLT 244 239 253  --     Cardiac EnzymesNo results for input(s): "TROPONINIHS" in the last 720 hours.  BNPNo results for input(s): "BNP", "PROBNP" in the last 168 hours.   DDimerNo results for input(s): "DDIMER" in the last 168 hours.   Radiology    ECHO TEE  Result Date: 01/20/2023    TRANSESOPHOGEAL ECHO REPORT   Patient Name:   Leonardo [facility] Date of Exam: 01/20/2023 Medical Rec #:  [id number]      Height:       73.0 in Accession #:    [id number]     Weight:       222.7 lb Date of Birth:  06-06-1966      BSA:          2.252 m Patient Age:    56 years       BP:           78/41 mmHg Patient Gender: M              HR:           96 bpm. Exam Location:  [facility] Procedure:  Transesophageal Echo, Cardiac Doppler and Color Doppler Indications:    Fever, unknown origin  History:        Patient has prior history of Echocardiogram examinations, most                 recent 01/16/2023. CHF, Arrythmias:Atrial Fibrillation; Risk                 Factors:Dyslipidemia and Hypertension. ESRD on Dialysis. Sleep                 apnea (From Hx).  Sonographer:    Alvino Chapel RCS Referring Phys: [id number] [place] PROCEDURE: The transesophogeal probe was passed without difficulty through the esophogus of the patient. Sedation performed by different physician. The patient developed no complications during the procedure.  IMPRESSIONS  1. Left ventricular ejection fraction, by estimation, is 65 to 70%. The left ventricle has normal function.  2. Right ventricular systolic function is mildly reduced. The right ventricular size is mildly enlarged.  3. Left atrial size was moderately dilated. No left atrial/left atrial appendage thrombus was detected.  4. Right atrial size was moderately dilated.  5. The mitral valve is abnormal. Mild mitral valve regurgitation. No evidence of mitral stenosis.  6. The tricuspid valve is abnormal.  7. The aortic valve is tricuspid. There is mild calcification of the aortic valve. There is mild thickening of the aortic valve. Aortic valve regurgitation is not visualized. No aortic stenosis is present. Conclusion(s)/Recommendation(s): No evidence of vegetation/infective endocarditis on this transesophageael echocardiogram. FINDINGS  Left Ventricle: Left ventricular ejection fraction, by estimation, is 65 to 70%. The left ventricle has normal function. The left ventricular internal cavity size was normal in size.  Right Ventricle: The right ventricular size is mildly enlarged. Right vetricular wall thickness was not well visualized. Right ventricular systolic function is mildly reduced. Left Atrium: Left atrial size was moderately dilated. No left atrial/left atrial  appendage thrombus was detected. Right Atrium: Right atrial size was moderately dilated. Pericardium: There is no evidence of pericardial effusion. Mitral Valve: The mitral valve is abnormal. Mild mitral valve regurgitation. No evidence of mitral valve stenosis. Tricuspid Valve: The tricuspid valve is abnormal. Tricuspid valve regurgitation is mild . No evidence of tricuspid stenosis. Aortic Valve: The aortic valve is tricuspid. There is mild calcification of the aortic valve. There is mild thickening of the aortic valve. There is mild aortic valve annular calcification. Aortic valve regurgitation is not visualized. No aortic stenosis  is present. Pulmonic Valve: The pulmonic valve was not well visualized. Pulmonic valve regurgitation is trivial. Aorta: The aortic root is normal in size and structure. IAS/Shunts: No atrial level shunt detected by color flow Doppler.   AORTA Ao Root diam: 3.30 cm Carlyle Dolly MD Electronically signed by Carlyle Dolly MD Signature Date/Time: 01/20/2023/2:54:41 PM    Final    CT TIBIA FIBULA LEFT W CONTRAST  Result Date: 01/19/2023 CLINICAL DATA:  Lower leg pain EXAM: CT OF THE LOWER LEFT EXTREMITY WITH CONTRAST TECHNIQUE: Multidetector CT imaging of the lower left extremity was performed according to the standard protocol following intravenous contrast administration. RADIATION DOSE REDUCTION: This exam was performed according to the departmental dose-optimization program which includes automated exposure control, adjustment of the mA and/or kV according to patient size and/or use of iterative reconstruction technique. CONTRAST:  32m OMNIPAQUE IOHEXOL 300 MG/ML  SOLN COMPARISON:  Radiographs 01/10/2023 FINDINGS: Bones/Joint/Cartilage No acute bony findings. We partially include degenerative findings in the midfoot and along the Chopart joint. Small fragmented spur along the inferior pole of the patella along the patellar tendon. Ligaments Suboptimally assessed by CT. Muscles  and Tendons Convex anterior margin of the distal Achilles tendon favors distal Achilles tendinopathy. Otherwise unremarkable. Soft tissues Substantial diffuse arterial atherosclerotic vascular calcifications in the calf. Subcutaneous edema along the calf generally more confluent posterolaterally except in the distal calf approaching the ankle where there is anterior subcutaneous thickening along with cutaneous irregularity and possible blistering, correlate with visual inspection. This tracks just superficial to the extensor tendons. Cutaneous irregularity and small gas density is along the cutaneous margin but without a well-defined drainable abscess. IMPRESSION: 1. Subcutaneous edema along the calf generally more confluent posterolaterally except in the distal calf approaching the ankle where there is anterior subcutaneous thickening along with cutaneous irregularity and small gas density along the cutaneous margin but without a well-defined drainable abscess. 2. Substantial diffuse arterial atherosclerotic calcifications in the calf. 3. Convex anterior margin of the distal Achilles tendon favors distal Achilles tendinopathy. 4. Partially visualized degenerative findings in the midfoot and along the Chopart joint. 5. Small fragmented spur along the inferior pole of the patella along the patellar tendon. Electronically Signed   By: WVan ClinesM.D.   On: 01/19/2023 17:43    Cardiac Studies  Echocardiogram and TEE from 01/2023 LVEF normal No vegetations   Assessment & Plan    # Paroxysmal SVT/short RP tachycardia -Telemetry reviewed, heart rate stayed in 130s since yesterday evening.  Nurse said this usually happens before dialysis. This could be in the setting of dehydration, presumed sepsis. Patient reports feeling extremely thirsty. Encourage p.o. fluid intake. -Continue IV amiodarone until heart rates are better controlled -No room on rate controlling  agents due to chronic hypotension from  midodrine  # Paroxysmal A-fib s/p Watchman device -Not on rate controlling agents due to chronic hypotension from midodrine -Not on AC due to GI bleed issues  # Chronic hypotension Been on midodrine at home  I have spent a total of 33 minutes with patient reviewing chart , telemetry, EKGs, labs and examining patient as well as establishing an assessment and plan that was discussed with the patient.  > 50% of time was spent in direct patient care.     Signed, Chalmers Guest, MD  01/21/2023, 10:25 AM

## 2023-01-21 NOTE — Progress Notes (Signed)
PROGRESS NOTE    [name]  [id number] DOB: February 16, 1966 DOA: 01/10/2023 PCP: Gardiner Rhyme, MD  Brief Narrative:  [name] is a 57 y.o. male with medical history significant of atrial fibrillation (Watchman device implanted), CHF, ESRD on dialysis MWF, DVT of left lower extremity, arterial ulcer of RLE, GERD, HLD, and chronic hypotension on midodrine admitted on 2/26 for A-fib with RVR and soft BP, meeting SIRS criteria for sepsis.  Admitted to ICU at [facility].  Patient had multiple runs of SVT and Cardiology was consulted; recommended amiodarone drip.  Patient continued to have SVT runs intermittently, remained on amiodarone drip with daily boluses, did require adenosine and extra amiodarone bolus on 3/5 of unstable SVT.  Nephrology consulted for HD and WOC consulted for care of arterial ulcer on RLE.   Started cefepime, metronidazole, and linezolid on admission, then narrowed to cefepime and linezolid 2/28, but became febrile again on 2/29.  New blood cultures were obtained on 2/29 due to persistent fevers.  In parallel, WBC continued to trend up from admission through 3/1, then stabilized.  Infectious disease consulted and recommended TTE, which did not show any vegetations.  CXR did show some questionable right lung base pleural effusion versus consolidation consistent with possible pneumonia.  MRSA PCR negative and linezolid stopped.  Patient's wife insisted on discontinuing cefepime in favor of ceftriaxone.  Patient refevered again 3/3 after stopping cefepime.  Restarted cefepime 3/5 with wife's agreement.  Febrile once more 3/5 and CT abdomen/chest/pelvis negative for acute pathology, but ESR and CRP were drawn and found to be elevated.  CT of LE arterial ulcer showed small amount of gas not concerning for abscess formation or active infection per general surgery.  Trialed off antibiotics starting 3/6 and repeated blood cultures.  TEE on 3/7 unremarkable.  3/8, WBCs were trending  down and patient had no fever for greater than 24 hours.  Problem List:   Principal Problem:   Sepsis ([place]) Active Problems:   Atrial fibrillation with rapid ventricular response (HCC)   Fever of unknown origin   GERD (gastroesophageal reflux disease)   ESRD on dialysis ([place])   Hypotension   Mixed hyperlipidemia   History of DVT (deep vein thrombosis)   Arterial insufficiency with ischemic ulcer (HCC)   Persistent atrial fibrillation (HCC)   Bilateral lower leg cellulitis   PSVT (paroxysmal supraventricular tachycardia)   Fever   Wound of left leg  Assessment and Plan:  Sepsis, leukocytes trending down No bacteremia identified, evaluation of LLE ulcer and TTE for endocarditis both unremarkable.  ESR and CRP repeated and still elevated.  However, given downtrend in WBCs and lack of fevers, suspect infection is improving.  Patient remains asymptomatic. - Midodrine for hypotension, minimize norepi use - No growth in blood repeat blood Cx @ 1 day - WBC trending down, no fever yesterday - Appreciate Infectious Disease recs:             - SPEP pending              ESRD on hemodialysis MWF Hypokalemia Tolerating HD, has AV fistula. - Electrolytes per nephrology during HD - Appreciate Nephrology recs:             - Renal labs daily             - On ESA for anemia  - HD with albumin today 3/8  Anemia of chronic disease Gradual Hgb trend from 134 to 9.8 this hospitalization.  Iron studies consistent  with anemia of chronic disease.  Not currently receiving iron supplementation, has been getting daily blood draws since admission, likely the cause of acute drop.  Hypotension, chronic - Continue high dose midodrine on dialysis days (20 mg); 15 mg on non HD days - He has been weaned off norepinephrine and will try not to restart this unless absolutely needed - Continue to monitor   Paroxysmal atrial fibrillation PSVT, persistent Continues to be tachycardic and in RVR with  intermittent SVTs.  Cardiology wondering if patient is dehydrated, counseled him to drink more water.  Will receive albumin with dialysis today.  S/p Watchman device, not a candidate for chronic anticoagulation, discontinued May 2022 due to GI bleed. - Cardiac tele monitoring - Cardiology following closely, appreciate recs: - Amiodarone rebolus daily, continue gtt; transition to PO once SVT controlled   Arterial ulcer of LLE CT lower extremity not suggestive of abscess or active infection 3/6.  Wound healing poorly and likely needs vascular surgery outpatient visit.  See [place] recs below. - Wound care per [place] recs - Minimize use of vasoconstrictive drugs given poor circulation - Appreciate general surgery consult   History of DVT History of DVT in the left lower extremity, finish 90 days of Eliquis.  Negative DVT US 3/5 in the setting of new calf pain. - Continue to monitor   GERD - Continue famotidine, added omeprazole per patient request yesterday; H2B also important for stress ulcer PPx best practice   Mixed hyperlipidemia - Continue statin   OSA - CPAP nightly   Skin Assessment: I have examined the patient's skin and I agree with the wound assessment as performed by the wound care RN as outlined below:   Wound type: full thickness, s/p debridement by plastics 11/30/2022  Pressure Injury POA: NA, not pressure related  Measurement: see nursing flowsheets, last measurements by plastics 8 cms x 6.25 cms  Wound bed: 100% red moist, appears to be tendon showing at superior portion of wound  Drainage (amount, consistency, odor)  see nursing flowsheet  Periwound: intact  Dressing procedure/placement/frequency: Clean L anterior ankle wound with NS, apply Silver Hydrofiber Kellie Simmering [phone]) cut to fit wound bed daily. Cover with dry gauze and  secure with Kerlix and 4"  Ace wrap Kellie Simmering [phone]).  May need to moisten silver with NS if stuck to wound bed at dressing changes.   DVT  prophylaxis: [place] heparin Code Status: Full Family Communication: Wife 3/8 on phone and at bedside Disposition Plan: Patient is from: Home Anticipated d/c is to: Home Anticipated d/c date is: Pending clinical improvement Patient currently: Pending fever workup Admission Status is: Inpatient Remains inpatient appropriate because: Critical illness and persistent febrile illness   Consultants:  Infectious Disease Cardiology Nephrology [place] General surgery   Procedures:  DVT US 2/27 unremarkable TTE 3/3 w/out vegetations DVT US 3/5 unremarkable TEE 3/7 unremarkable   Antimicrobials:  Metronidazole (2/26, 2/29>3/6) Cefepime (2/27>3/2, 3/5>3/6) Ceftriaxone (3/3>3/5) Linezolid (2/27>3/2)  Subjective: This morning, patient has no new acute complaints or concerns.  He says he has been feeling really good the past few mornings, though he felt very out of it following his TEE yesterday.  No shortness of breath, no chest pain.  Does not feel any palpitations.  Denies any generalized discomfort or pain.  Objective: Vitals:   01/21/23 0200 01/21/23 0400 01/21/23 0500 01/21/23 0713  BP: (!) 80/43  (!) 90/53 106/67  Pulse:      Resp: (!) 30 14 (!) 21 (!) 23  Temp:  97.8 F (36.6 C)    TempSrc:  Oral    SpO2:      Weight:      Height:        Intake/Output Summary (Last 24 hours) at 01/21/2023 0740 Last data filed at 01/21/2023 0735 Gross per 24 hour  Intake 1102.16 ml  Output 0 ml  Net 1102.16 ml   Filed Weights   01/19/23 0736 01/19/23 1200 01/20/23 0600  Weight: 100.8 kg 99.9 kg 101 kg    Examination: General: Adult male resting in bed.  No acute distress.  Pleasant appropriate. HEENT: MMM. Lungs: Mild moist crackles at right lung base, otherwise clear to auscultation bilaterally.  Normal work of breathing on room air. Cardiovascular: Regular rhythm, tachycardic.  No murmurs/rubs/gallops. Abdomen: Soft, nondistended.  No tenderness to palpation. Extremities: Left lower  extremity ulcer covered with dressing, no drainage or surrounding erythema.  Capillary refill less than 2 seconds.  No peripheral edema, cyanosis or clubbing. Skin: Dry, warm.  No rashes or lesions grossly except for left lower extremity ulcer. Neuro: Alert and oriented x 4.  No focal neurological deficit.  Data Reviewed: I have personally reviewed following labs and imaging studies.  CBC: Recent Labs  Lab 01/15/23 0430 01/16/23 0352 01/17/23 0318 01/18/23 0431 01/19/23 0418 01/20/23 0344 01/21/23 0452  WBC 14.6* 13.8* 14.7* 16.7* 15.3* 12.5* 11.2*  NEUTROABS 10.6* 10.6* 10.9* 12.5* 11.2*  --   --   HGB 11.3* 11.4* 11.5* 10.3* 10.9* 10.1* 9.8*  HCT 37.8* 37.9* 37.2* 32.0* 33.9* 31.1* 30.6*  MCV 96.2 95.0 93.2 89.6 89.9 87.6 88.7  PLT 245 252 269 238 244 239 [id number]   Basic Metabolic Panel: Recent Labs  Lab 01/16/23 0352 01/17/23 0318 01/18/23 0431 01/18/23 0804 01/19/23 0418 01/20/23 0344 01/21/23 0452  NA 132* 132* 132*  --  133* 134* 135  K 4.1 3.9 3.3*  --  3.4* 3.1* 3.3*  CL 91* 90* 92*  --  91* 92* 94*  CO2 25 23 20*  --  '25 27 25  '$ GLUCOSE 84 92 90  --  89 92 83  BUN 24* 33* 41*  --  23* 16 23*  CREATININE 8.95* 10.61* 12.31*  --  8.14* 6.25* 8.32*  CALCIUM 7.7* 7.9* 7.4*  --  7.5* 7.5* 7.5*  MG 1.7  --   --  2.3 1.9 1.7 1.7  PHOS 2.4* 3.0 3.2  --  2.5 1.9* 2.6   GFR: Estimated Creatinine Clearance: 12.7 mL/min (A) (by C-G formula based on SCr of 8.32 mg/dL (H)). Liver Function Tests: Recent Labs  Lab 01/15/23 0430 01/16/23 0352 01/17/23 0318 01/18/23 0431 01/19/23 0418 01/20/23 0344 01/21/23 0452  AST 24  --   --   --   --   --   --   ALT 7  --   --   --   --   --   --   ALKPHOS 204*  --   --   --   --   --   --   BILITOT 0.6  --   --   --   --   --   --   PROT 6.0*  --   --   --   --   --   --   ALBUMIN 2.2*  2.2*   < > 2.7* 2.3* 2.4* 2.6* 2.4*   < > = values in this interval not displayed.   No results for input(s): "LIPASE", "AMYLASE" in the last  168 hours. No results for input(s): "AMMONIA" in the last 168 hours. Coagulation Profile: No results for input(s): "INR", "PROTIME" in the last 168 hours. Cardiac Enzymes: Recent Labs  Lab 01/21/23 0452  CKTOTAL 47*   BNP (last 3 results) No results for input(s): "PROBNP" in the last 8760 hours. HbA1C: No results for input(s): "HGBA1C" in the last 72 hours. CBG: No results for input(s): "GLUCAP" in the last 168 hours. Lipid Profile: No results for input(s): "CHOL", "HDL", "LDLCALC", "TRIG", "CHOLHDL", "LDLDIRECT" in the last 72 hours. Thyroid Function Tests: No results for input(s): "TSH", "T4TOTAL", "FREET4", "T3FREE", "THYROIDAB" in the last 72 hours. Anemia Panel: No results for input(s): "VITAMINB12", "FOLATE", "FERRITIN", "TIBC", "IRON", "RETICCTPCT" in the last 72 hours. Sepsis Labs: Recent Labs  Lab 01/14/23 1318 01/15/23 0430 01/16/23 0352  PROCALCITON 6.64 11.08 15.70    Recent Results (from the past 240 hour(s))  MRSA Next Gen by PCR, Nasal     Status: None   Collection Time: 01/11/23  2:27 PM   Specimen: Nasal Mucosa; Nasal Swab  Result Value Ref Range Status   MRSA by PCR Next Gen NOT DETECTED NOT DETECTED Final    Comment: (NOTE) The GeneXpert MRSA Assay (FDA approved for NASAL specimens only), is one component of a comprehensive MRSA colonization surveillance program. It is not intended to diagnose MRSA infection nor to guide or monitor treatment for MRSA infections. Test performance is not FDA approved in patients less than 57 years old. Performed at [facility], [address], [place], [place] [address]   Culture, blood (Routine X 2) w Reflex to ID Panel     Status: None   Collection Time: 01/13/23  5:42 PM   Specimen: Right Antecubital; Blood  Result Value Ref Range Status   Specimen Description RIGHT ANTECUBITAL  Final   Special Requests   Final    BOTTLES DRAWN AEROBIC AND ANAEROBIC Blood Culture adequate volume   Culture   Final    NO GROWTH 6  DAYS Performed at [facility], [address] [place], [place] [address]    Report Status 01/19/2023 FINAL  Final  Culture, blood (Routine X 2) w Reflex to ID Panel     Status: None   Collection Time: 01/13/23  5:42 PM   Specimen: BLOOD RIGHT HAND  Result Value Ref Range Status   Specimen Description BLOOD RIGHT HAND  Final   Special Requests   Final    BOTTLES DRAWN AEROBIC AND ANAEROBIC Blood Culture adequate volume   Culture   Final    NO GROWTH 6 DAYS Performed at [facility], [address], [place], [place] [address]    Report Status 01/19/2023 FINAL  Final  Culture, blood (Routine X 2) w Reflex to ID Panel     Status: None (Preliminary result)   Collection Time: 01/20/23  3:33 PM   Specimen: BLOOD LEFT HAND  Result Value Ref Range Status   Specimen Description BLOOD LEFT HAND  Final   Special Requests   Final    BOTTLES DRAWN AEROBIC AND ANAEROBIC Blood Culture results may not be optimal due to an excessive volume of blood received in culture bottles Performed at [facility], [address], [place], [place] [address]    Culture PENDING  Incomplete   Report Status PENDING  Incomplete  Culture, blood (Routine X 2) w Reflex to ID Panel     Status: None (Preliminary result)   Collection Time: 01/20/23  3:54 PM   Specimen: BLOOD RIGHT HAND  Result Value Ref Range Status   Specimen Description BLOOD RIGHT HAND  Final   Special Requests   Final    BOTTLES DRAWN AEROBIC AND ANAEROBIC Blood Culture results may not be optimal due to an excessive volume of blood received in culture bottles Performed at [facility], [address], [place], [place] [address]    Culture PENDING  Incomplete   Report Status PENDING  Incomplete     Radiology Studies: ECHO TEE  Result Date: 01/20/2023    TRANSESOPHOGEAL ECHO REPORT   Patient Name:   Delvis [facility] Date of Exam: 01/20/2023 Medical Rec #:  [id number]      Height:       73.0 in Accession #:    [id number]     Weight:       222.7 lb Date of  Birth:  [birth year]      BSA:          2.252 m Patient Age:    34 years       BP:           78/41 mmHg Patient Gender: M              HR:           96 bpm. Exam Location:  [facility] Procedure: Transesophageal Echo, Cardiac Doppler and Color Doppler Indications:    Fever, unknown origin  History:        Patient has prior history of Echocardiogram examinations, most                 recent 01/16/2023. CHF, Arrythmias:Atrial Fibrillation; Risk                 Factors:Dyslipidemia and Hypertension. ESRD on Dialysis. Sleep                 apnea (From Hx).  Sonographer:    Alvino Chapel RCS Referring Phys: [id number] [place] PROCEDURE: The transesophogeal probe was passed without difficulty through the esophogus of the patient. Sedation performed by different physician. The patient developed no complications during the procedure.  IMPRESSIONS  1. Left ventricular ejection fraction, by estimation, is 65 to 70%. The left ventricle has normal function.  2. Right ventricular systolic function is mildly reduced. The right ventricular size is mildly enlarged.  3. Left atrial size was moderately dilated. No left atrial/left atrial appendage thrombus was detected.  4. Right atrial size was moderately dilated.  5. The mitral valve is abnormal. Mild mitral valve regurgitation. No evidence of mitral stenosis.  6. The tricuspid valve is abnormal.  7. The aortic valve is tricuspid. There is mild calcification of the aortic valve. There is mild thickening of the aortic valve. Aortic valve regurgitation is not visualized. No aortic stenosis is present. Conclusion(s)/Recommendation(s): No evidence of vegetation/infective endocarditis on this transesophageael echocardiogram. FINDINGS  Left Ventricle: Left ventricular ejection fraction, by estimation, is 65 to 70%. The left ventricle has normal function. The left ventricular internal cavity size was normal in size. Right Ventricle: The right ventricular size is mildly enlarged. Right  vetricular wall thickness was not well visualized. Right ventricular systolic function is mildly reduced. Left Atrium: Left atrial size was moderately dilated. No left atrial/left atrial appendage thrombus was detected. Right Atrium: Right atrial size was moderately dilated. Pericardium: There is no evidence of pericardial effusion. Mitral Valve: The mitral valve is abnormal. Mild mitral valve regurgitation. No evidence of mitral valve stenosis. Tricuspid Valve: The tricuspid valve is abnormal. Tricuspid valve  regurgitation is mild . No evidence of tricuspid stenosis. Aortic Valve: The aortic valve is tricuspid. There is mild calcification of the aortic valve. There is mild thickening of the aortic valve. There is mild aortic valve annular calcification. Aortic valve regurgitation is not visualized. No aortic stenosis  is present. Pulmonic Valve: The pulmonic valve was not well visualized. Pulmonic valve regurgitation is trivial. Aorta: The aortic root is normal in size and structure. IAS/Shunts: No atrial level shunt detected by color flow Doppler.   AORTA Ao Root diam: 3.30 cm Carlyle Dolly MD Electronically signed by Carlyle Dolly MD Signature Date/Time: 01/20/2023/2:54:41 PM    Final    CT TIBIA FIBULA LEFT W CONTRAST  Result Date: 01/19/2023 CLINICAL DATA:  Lower leg pain EXAM: CT OF THE LOWER LEFT EXTREMITY WITH CONTRAST TECHNIQUE: Multidetector CT imaging of the lower left extremity was performed according to the standard protocol following intravenous contrast administration. RADIATION DOSE REDUCTION: This exam was performed according to the departmental dose-optimization program which includes automated exposure control, adjustment of the mA and/or kV according to patient size and/or use of iterative reconstruction technique. CONTRAST:  28m OMNIPAQUE IOHEXOL 300 MG/ML  SOLN COMPARISON:  Radiographs 01/10/2023 FINDINGS: Bones/Joint/Cartilage No acute bony findings. We partially include degenerative  findings in the midfoot and along the [place]opart joint. Small fragmented spur along the inferior pole of the patella along the patellar tendon. Ligaments Suboptimally assessed by CT. Muscles and Tendons Convex anterior margin of the distal Achilles tendon favors distal Achilles tendinopathy. Otherwise unremarkable. Soft tissues Substantial diffuse arterial atherosclerotic vascular calcifications in the calf. Subcutaneous edema along the calf generally more confluent posterolaterally except in the distal calf approaching the ankle where there is anterior subcutaneous thickening along with cutaneous irregularity and possible blistering, correlate with visual inspection. This tracks just superficial to the extensor tendons. Cutaneous irregularity and small gas density is along the cutaneous margin but without a well-defined drainable abscess. IMPRESSION: 1. Subcutaneous edema along the calf generally more confluent posterolaterally except in the distal calf approaching the ankle where there is anterior subcutaneous thickening along with cutaneous irregularity and small gas density along the cutaneous margin but without a well-defined drainable abscess. 2. Substantial diffuse arterial atherosclerotic calcifications in the calf. 3. Convex anterior margin of the distal Achilles tendon favors distal Achilles tendinopathy. 4. Partially visualized degenerative findings in the midfoot and along the Chopart joint. 5. Small fragmented spur along the inferior pole of the patella along the patellar tendon. Electronically Signed   By: WVan ClinesM.D.   On: 01/19/2023 17:43    Scheduled Meds:  aspirin EC  81 mg Oral q AM   atorvastatin  80 mg Oral QHS   calcium carbonate  1,500 mg Oral QHS   Chlorhexidine Gluconate Cloth  6 each Topical Q0600   Chlorhexidine Gluconate Cloth  6 each Topical Q0600   famotidine  20 mg Oral QHS   heparin  5,000 Units Subcutaneous Q8H   midodrine  15 mg Oral TID WC   pantoprazole  40 mg  Oral Q breakfast   saccharomyces boulardii  250 mg Oral BID   tamsulosin  0.4 mg Oral QPM   Continuous Infusions:  sodium chloride 10 mL/hr at 01/20/23 1239   amiodarone 30 mg/hr (01/21/23 0735)     LOS: 11 days   Time spent: 35 minutes  Katrece Roediger, MS4 [facility]  If 7PM-7AM, please contact night-coverage [email or web address] 01/21/2023, 7:40 AM

## 2023-01-21 NOTE — Anesthesia Postprocedure Evaluation (Signed)
Anesthesia Post Note  Patient: [name]  Procedure(s) Performed: TRANSESOPHAGEAL ECHOCARDIOGRAM (TEE)  Patient location during evaluation: Phase II Anesthesia Type: General Level of consciousness: awake Pain management: pain level controlled Vital Signs Assessment: post-procedure vital signs reviewed and stable Respiratory status: spontaneous breathing and respiratory function stable Cardiovascular status: blood pressure returned to baseline and stable Postop Assessment: no headache and no apparent nausea or vomiting Anesthetic complications: no Comments: Late entry   No notable events documented.   Last Vitals:  Vitals:   01/21/23 0900 01/21/23 1000  BP: (!) 96/56 (!) 92/45  Pulse:    Resp: (!) 22 (!) 21  Temp:    SpO2:      Last Pain:  Vitals:   01/21/23 0806  TempSrc: Oral  PainSc:                  Louann Sjogren

## 2023-01-22 DIAGNOSIS — R6520 Severe sepsis without septic shock: Secondary | ICD-10-CM | POA: Diagnosis not present

## 2023-01-22 DIAGNOSIS — A419 Sepsis, unspecified organism: Secondary | ICD-10-CM | POA: Diagnosis not present

## 2023-01-22 LAB — RENAL FUNCTION PANEL
Albumin: 2.7 g/dL — ABNORMAL LOW (ref 3.5–5.0)
Anion gap: 12 (ref 5–15)
BUN: 13 mg/dL (ref 6–20)
CO2: 28 mmol/L (ref 22–32)
Calcium: 7.7 mg/dL — ABNORMAL LOW (ref 8.9–10.3)
Chloride: 94 mmol/L — ABNORMAL LOW (ref 98–111)
Creatinine, Ser: 5.78 mg/dL — ABNORMAL HIGH (ref 0.61–1.24)
GFR, Estimated: 11 mL/min — ABNORMAL LOW (ref >60)
Glucose, Bld: 93 mg/dL (ref 70–99)
Phosphorus: 1.8 mg/dL — ABNORMAL LOW (ref 2.5–4.6)
Potassium: 3.2 mmol/L — ABNORMAL LOW (ref 3.5–5.1)
Sodium: 134 mmol/L — ABNORMAL LOW (ref 135–145)

## 2023-01-22 LAB — CBC
HCT: 31.4 % — ABNORMAL LOW (ref 39.0–52.0)
Hemoglobin: 10.1 g/dL — ABNORMAL LOW (ref 13.0–17.0)
MCH: 28 pg (ref 26.0–34.0)
MCHC: 32.2 g/dL (ref 30.0–36.0)
MCV: 87 fL (ref 80.0–100.0)
Platelets: 291 10*3/uL (ref 150–400)
RBC: 3.61 MIL/uL — ABNORMAL LOW (ref 4.22–5.81)
RDW: 19.4 % — ABNORMAL HIGH (ref 11.5–15.5)
WBC: 9.9 10*3/uL (ref 4.0–10.5)
nRBC: 0 % (ref 0.0–0.2)

## 2023-01-22 LAB — MAGNESIUM: Magnesium: 1.6 mg/dL — ABNORMAL LOW (ref 1.7–2.4)

## 2023-01-22 MED ORDER — POTASSIUM CHLORIDE CRYS ER 20 MEQ PO TBCR
40.0000 meq | EXTENDED_RELEASE_TABLET | Freq: Once | ORAL | Status: AC
  Administered 2023-01-22: 40 meq via ORAL
  Filled 2023-01-22: qty 2

## 2023-01-22 MED ORDER — MAGNESIUM SULFATE 2 GM/50ML IV SOLN
2.0000 g | Freq: Once | INTRAVENOUS | Status: AC
  Administered 2023-01-22: 2 g via INTRAVENOUS
  Filled 2023-01-22: qty 50

## 2023-01-22 MED ORDER — PENTAFLUOROPROP-TETRAFLUOROETH EX AERO
INHALATION_SPRAY | CUTANEOUS | Status: AC
  Filled 2023-01-22: qty 30

## 2023-01-22 NOTE — Progress Notes (Signed)
PROGRESS NOTE    [name]  [id number] DOB: 1966/08/10 DOA: 01/10/2023 PCP: Gardiner Rhyme, MD  Brief Narrative:  [name] is a 57 y.o. male with medical history significant of atrial fibrillation (Watchman device implanted), CHF, ESRD on dialysis MWF, DVT of left lower extremity, arterial ulcer of RLE, GERD, HLD, and chronic hypotension on midodrine admitted on 2/26 for A-fib with RVR and soft BP, meeting SIRS criteria for sepsis.  Ad[facility]  Patient had multiple runs of SVT and Cardiology was consulted; recommended amiodarone drip.  Patient continued to have SVT runs intermittently, remained on amiodarone drip with daily boluses, did require adenosine and extra amiodarone bolus on 3/5 of unstable SVT.  Nephrology consulted for HD and WOC consulted for care of arterial ulcer on RLE.   Started cefepime, metronidazole, and linezolid on admission, then narrowed to cefepime and linezolid 2/28, but became febrile again on 2/29.  New blood cultures were obtained on 2/29 due to persistent fevers.  In parallel, WBC continued to trend up from admission through 3/1, then stabilized.  Infectious disease consulted and recommended TTE, which did not show any vegetations.  CXR did show some questionable right lung base pleural effusion versus consolidation consistent with possible pneumonia.  MRSA PCR negative and linezolid stopped.  Patient's wife insisted on discontinuing cefepime in favor of ceftriaxone.  Patient refevered again 3/3 after stopping cefepime.  Restarted cefepime 3/5 with wife's agreement.  Febrile once more 3/5 and CT abdomen/chest/pelvis negative for acute pathology, but ESR and CRP were drawn and found to be elevated.  CT of LE arterial ulcer showed small amount of gas not concerning for abscess formation or active infection per general surgery.  Trialed off antibiotics starting 3/6 and repeated blood cultures.  TEE on 3/7 unremarkable.  3/8, WBCs were trending  down and patient had no fever for greater than 24 hours.  Problem List:   Principal Problem:   Sepsis ([place]) Active Problems:   ESRD on dialysis ([place])   GERD (gastroesophageal reflux disease)   Mixed hyperlipidemia   Atrial fibrillation with rapid ventricular response (HCC)   Fever of unknown origin   Hypotension   History of DVT (deep vein thrombosis)   Arterial insufficiency with ischemic ulcer (HCC)   Persistent atrial fibrillation (HCC)   Bilateral lower leg cellulitis   PSVT (paroxysmal supraventricular tachycardia)   Fever   Wound of left leg  Assessment and Plan:  Sepsis, leukocytes trending down No bacteremia identified, evaluation of LLE ulcer and TTE for endocarditis both unremarkable.  ESR and CRP repeated and still elevated.  However, given downtrend in WBCs and lack of fevers, suspect infection is improving.  Patient remains asymptomatic. - Midodrine for hypotension, minimize norepi use - No growth in blood repeat blood Cx @ 1 day - WBC trending down, no fever yesterday - Appreciate Infectious Disease recs:             - SPEP pending              ESRD on hemodialysis MWF Hypokalemia/Hypomagnesemia Tolerating HD, has AV fistula. - Electrolytes to be repleted, continue to follow - Appreciate Nephrology recs:             - Renal labs daily             - On ESA for anemia  - HD with albumin today 3/8  Anemia of chronic disease Gradual Hgb trend from 134 to 9.8 this hospitalization.  Iron  studies consistent with anemia of chronic disease.  Not currently receiving iron supplementation, has been getting daily blood draws since admission, likely the cause of acute drop.  Hypotension, chronic - Continue high dose midodrine on dialysis days (20 mg); 15 mg on non HD days - He has been weaned off norepinephrine and will try not to restart this unless absolutely needed - Continue to monitor   Paroxysmal atrial fibrillation PSVT, persistent Continues to be tachycardic  and in RVR with intermittent SVTs.  Cardiology wondering if patient is dehydrated, counseled him to drink more water.  Will receive albumin with dialysis today.  S/p Watchman device, not a candidate for chronic anticoagulation, discontinued May 2022 due to GI bleed. - Cardiac tele monitoring - Cardiology following closely, appreciate recs: - Amiodarone rebolus daily, continue gtt; transition to PO once SVT controlled   Arterial ulcer of LLE CT lower extremity not suggestive of abscess or active infection 3/6.  Wound healing poorly and likely needs vascular surgery outpatient visit.  See [place] recs below. - Wound care per [place] recs - Minimize use of vasoconstrictive drugs given poor circulation - Appreciate general surgery consult   History of DVT History of DVT in the left lower extremity, finish 90 days of Eliquis.  Negative DVT US 3/5 in the setting of new calf pain. - Continue to monitor   GERD - Continue famotidine, added omeprazole per patient request yesterday; H2B also important for stress ulcer PPx best practice   Mixed hyperlipidemia - Continue statin   OSA - CPAP nightly   Skin Assessment: I have examined the patient's skin and I agree with the wound assessment as performed by the wound care RN as outlined below:   Wound type: full thickness, s/p debridement by plastics 11/30/2022  Pressure Injury POA: NA, not pressure related  Measurement: see nursing flowsheets, last measurements by plastics 8 cms x 6.25 cms  Wound bed: 100% red moist, appears to be tendon showing at superior portion of wound  Drainage (amount, consistency, odor)  see nursing flowsheet  Periwound: intact  Dressing procedure/placement/frequency: Clean L anterior ankle wound with NS, apply Silver Hydrofiber Kellie Simmering [phone]) cut to fit wound bed daily. Cover with dry gauze and  secure with Kerlix and 4"  Ace wrap Kellie Simmering [phone]).  May need to moisten silver with NS if stuck to wound bed at dressing  changes.   DVT prophylaxis:  heparin Code Status: Full Family Communication: Wife 3/8 on phone and at bedside Disposition Plan: Patient is from: Home Anticipated d/c is to: Home Anticipated d/c date is: Pending clinical improvement Patient currently: Pending fever workup Admission Status is: Inpatient Remains inpatient appropriate because: Critical illness and persistent febrile illness   Consultants:  Infectious Disease Cardiology Nephrology [place] General surgery   Procedures:  DVT US 2/27 unremarkable TTE 3/3 w/out vegetations DVT US 3/5 unremarkable TEE 3/7 unremarkable   Antimicrobials:  Metronidazole (2/26, 2/29>3/6) Cefepime (2/27>3/2, 3/5>3/6) Ceftriaxone (3/3>3/5) Linezolid (2/27>3/2)  Subjective: Patient seen and evaluated this a.m. and continues to appear to be in good spirits.  Heart rate appears to be downward trending at this time.  No acute overnight events noted.  Objective: Vitals:   01/22/23 0900 01/22/23 1000 01/22/23 1100 01/22/23 1109  BP: (!) 103/58 105/60 95/60   Pulse: (!) 106 95 96   Resp: 12 18 (!) 21   Temp:    98 F (36.7 C)  TempSrc:    Oral  SpO2: 95% 99% 96%   Weight:  Height:        Intake/Output Summary (Last 24 hours) at 01/22/2023 1210 Last data filed at 01/22/2023 0800 Gross per 24 hour  Intake 1322.93 ml  Output 1000 ml  Net 322.93 ml   Filed Weights   01/21/23 1445 01/22/23 0356 01/22/23 0524  Weight: 101.1 kg 102.5 kg 102 kg    Examination: General: Adult male resting in bed.  No acute distress.  Pleasant appropriate. HEENT: MMM. Lungs: Mild moist crackles at right lung base, otherwise clear to auscultation bilaterally.  Normal work of breathing on room air. Cardiovascular: Regular rhythm, tachycardic.  No murmurs/rubs/gallops. Abdomen: Soft, nondistended.  No tenderness to palpation. Extremities: Left lower extremity ulcer covered with dressing, no drainage or surrounding erythema.  Capillary refill less than 2  seconds.  No peripheral edema, cyanosis or clubbing. Skin: Dry, warm.  No rashes or lesions grossly except for left lower extremity ulcer. Neuro: Alert and oriented x 4.  No focal neurological deficit.  Data Reviewed: I have personally reviewed following labs and imaging studies.  CBC: Recent Labs  Lab 01/16/23 0352 01/17/23 0318 01/18/23 0431 01/19/23 0418 01/20/23 0344 01/21/23 0452 01/22/23 0428  WBC 13.8* 14.7* 16.7* 15.3* 12.5* 11.2* 9.9  NEUTROABS 10.6* 10.9* 12.5* 11.2*  --   --   --   HGB 11.4* 11.5* 10.3* 10.9* 10.1* 9.8* 10.1*  HCT 37.9* 37.2* 32.0* 33.9* 31.1* 30.6* 31.4*  MCV 95.0 93.2 89.6 89.9 87.6 88.7 87.0  PLT 252 269 238 244 239 253 [id number]   Basic Metabolic Panel: Recent Labs  Lab 01/18/23 0431 01/18/23 0804 01/19/23 0418 01/20/23 0344 01/21/23 0452 01/22/23 0428  NA 132*  --  133* 134* 135 134*  K 3.3*  --  3.4* 3.1* 3.3* 3.2*  CL 92*  --  91* 92* 94* 94*  CO2 20*  --  '25 27 25 28  '$ GLUCOSE 90  --  89 92 83 93  BUN 41*  --  23* 16 23* 13  CREATININE 12.31*  --  8.14* 6.25* 8.32* 5.78*  CALCIUM 7.4*  --  7.5* 7.5* 7.5* 7.7*  MG  --  2.3 1.9 1.7 1.7 1.6*  PHOS 3.2  --  2.5 1.9* 2.6 1.8*   GFR: Estimated Creatinine Clearance: 18.3 mL/min (A) (by C-G formula based on SCr of 5.78 mg/dL (H)). Liver Function Tests: Recent Labs  Lab 01/18/23 0431 01/19/23 0418 01/20/23 0344 01/21/23 0452 01/22/23 0428  ALBUMIN 2.3* 2.4* 2.6* 2.4* 2.7*   No results for input(s): "LIPASE", "AMYLASE" in the last 168 hours. No results for input(s): "AMMONIA" in the last 168 hours. Coagulation Profile: No results for input(s): "INR", "PROTIME" in the last 168 hours. Cardiac Enzymes: Recent Labs  Lab 01/21/23 0452  CKTOTAL 47*   BNP (last 3 results) No results for input(s): "PROBNP" in the last 8760 hours. HbA1C: No results for input(s): "HGBA1C" in the last 72 hours. CBG: No results for input(s): "GLUCAP" in the last 168 hours. Lipid Profile: No results for  input(s): "CHOL", "HDL", "LDLCALC", "TRIG", "CHOLHDL", "LDLDIRECT" in the last 72 hours. Thyroid Function Tests: No results for input(s): "TSH", "T4TOTAL", "FREET4", "T3FREE", "THYROIDAB" in the last 72 hours. Anemia Panel: Recent Labs    01/21/23 0757  VITAMINB12 1,669*  FOLATE 9.8  FERRITIN 349*  TIBC 142*  IRON 18*  RETICCTPCT 0.6   Sepsis Labs: Recent Labs  Lab 01/16/23 0352  PROCALCITON 15.70    Recent Results (from the past 240 hour(s))  Culture, blood (Routine X 2) w  Reflex to ID Panel     Status: None   Collection Time: 01/13/23  5:42 PM   Specimen: Right Antecubital; Blood  Result Value Ref Range Status   Specimen Description RIGHT ANTECUBITAL  Final   Special Requests   Final    BOTTLES DRAWN AEROBIC AND ANAEROBIC Blood Culture adequate volume   Culture   Final    NO GROWTH 6 DAYS Performed at [facility], [address] [place], [place] [address]    Report Status 01/19/2023 FINAL  Final  Culture, blood (Routine X 2) w Reflex to ID Panel     Status: None   Collection Time: 01/13/23  5:42 PM   Specimen: BLOOD RIGHT HAND  Result Value Ref Range Status   Specimen Description BLOOD RIGHT HAND  Final   Special Requests   Final    BOTTLES DRAWN AEROBIC AND ANAEROBIC Blood Culture adequate volume   Culture   Final    NO GROWTH 6 DAYS Performed at [facility], [address], [place], [place] [address]    Report Status 01/19/2023 FINAL  Final  Culture, blood (Routine X 2) w Reflex to ID Panel     Status: None (Preliminary result)   Collection Time: 01/20/23  3:33 PM   Specimen: BLOOD LEFT HAND  Result Value Ref Range Status   Specimen Description BLOOD LEFT HAND  Final   Special Requests   Final    BOTTLES DRAWN AEROBIC AND ANAEROBIC Blood Culture results may not be optimal due to an excessive volume of blood received in culture bottles   Culture   Final    NO GROWTH 2 DAYS Performed at [facility], [address], [place], [place] [address]    Report  Status PENDING  Incomplete  Culture, blood (Routine X 2) w Reflex to ID Panel     Status: None (Preliminary result)   Collection Time: 01/20/23  3:54 PM   Specimen: BLOOD RIGHT HAND  Result Value Ref Range Status   Specimen Description BLOOD RIGHT HAND  Final   Special Requests   Final    BOTTLES DRAWN AEROBIC AND ANAEROBIC Blood Culture results may not be optimal due to an excessive volume of blood received in culture bottles   Culture   Final    NO GROWTH 2 DAYS Performed at [facility], [address] [place], [place] [address]    Report Status PENDING  Incomplete     Radiology Studies: ECHO TEE  Result Date: 01/20/2023    TRANSESOPHOGEAL ECHO REPORT   Patient Name:   [facility] Date of Exam: 01/20/2023 Medical Rec #:  [id number]      Height:       73.0 in Accession #:    [id number]     Weight:       222.7 lb Date of Birth:  08/02/1966      BSA:          2.252 m Patient Age:    25 years       BP:           78/41 mmHg Patient Gender: M              HR:           96 bpm. Exam Location:  [facility] Procedure: Transesophageal Echo, Cardiac Doppler and Color Doppler Indications:    Fever, unknown origin  History:        Patient has prior history of Echocardiogram examinations, most  recent 01/16/2023. CHF, Arrythmias:Atrial Fibrillation; Risk                 Factors:Dyslipidemia and Hypertension. ESRD on Dialysis. Sleep                 apnea (From Hx).  Sonographer:    Alvino Chapel RCS Referring Phys: [id number] [place] PROCEDURE: The transesophogeal probe was passed without difficulty through the esophogus of the patient. Sedation performed by different physician. The patient developed no complications during the procedure.  IMPRESSIONS  1. Left ventricular ejection fraction, by estimation, is 65 to 70%. The left ventricle has normal function.  2. Right ventricular systolic function is mildly reduced. The right ventricular size is mildly enlarged.  3. Left atrial size was  moderately dilated. No left atrial/left atrial appendage thrombus was detected.  4. Right atrial size was moderately dilated.  5. The mitral valve is abnormal. Mild mitral valve regurgitation. No evidence of mitral stenosis.  6. The tricuspid valve is abnormal.  7. The aortic valve is tricuspid. There is mild calcification of the aortic valve. There is mild thickening of the aortic valve. Aortic valve regurgitation is not visualized. No aortic stenosis is present. Conclusion(s)/Recommendation(s): No evidence of vegetation/infective endocarditis on this transesophageael echocardiogram. FINDINGS  Left Ventricle: Left ventricular ejection fraction, by estimation, is 65 to 70%. The left ventricle has normal function. The left ventricular internal cavity size was normal in size. Right Ventricle: The right ventricular size is mildly enlarged. Right vetricular wall thickness was not well visualized. Right ventricular systolic function is mildly reduced. Left Atrium: Left atrial size was moderately dilated. No left atrial/left atrial appendage thrombus was detected. Right Atrium: Right atrial size was moderately dilated. Pericardium: There is no evidence of pericardial effusion. Mitral Valve: The mitral valve is abnormal. Mild mitral valve regurgitation. No evidence of mitral valve stenosis. Tricuspid Valve: The tricuspid valve is abnormal. Tricuspid valve regurgitation is mild . No evidence of tricuspid stenosis. Aortic Valve: The aortic valve is tricuspid. There is mild calcification of the aortic valve. There is mild thickening of the aortic valve. There is mild aortic valve annular calcification. Aortic valve regurgitation is not visualized. No aortic stenosis  is present. Pulmonic Valve: The pulmonic valve was not well visualized. Pulmonic valve regurgitation is trivial. Aorta: The aortic root is normal in size and structure. IAS/Shunts: No atrial level shunt detected by color flow Doppler.   AORTA Ao Root diam: 3.30  cm Carlyle Dolly MD Electronically signed by Carlyle Dolly MD Signature Date/Time: 01/20/2023/2:54:41 PM    Final     Scheduled Meds:  aspirin EC  81 mg Oral q AM   atorvastatin  80 mg Oral QHS   calcium carbonate  1,500 mg Oral QHS   Chlorhexidine Gluconate Cloth  6 each Topical Q0600   Chlorhexidine Gluconate Cloth  6 each Topical Q0600   darbepoetin (ARANESP) injection - NON-DIALYSIS  100 mcg Subcutaneous Q Fri-1800   famotidine  20 mg Oral QHS   heparin  5,000 Units Subcutaneous Q8H   midodrine  15 mg Oral TID WC   pantoprazole  40 mg Oral Q breakfast   saccharomyces boulardii  250 mg Oral BID   tamsulosin  0.4 mg Oral QPM   Continuous Infusions:  sodium chloride Stopped (01/22/23 1056)   albumin human Stopped (01/22/23 1159)   amiodarone 30 mg/hr (01/22/23 0547)     LOS: 12 days   Time spent: 35 minutes  Jacinta Penalver D Allysson Rinehimer,DO [facility]  If 7PM-7AM, please contact night-coverage [email or web address] 01/22/2023, 12:10 PM

## 2023-01-23 DIAGNOSIS — R6520 Severe sepsis without septic shock: Secondary | ICD-10-CM | POA: Diagnosis not present

## 2023-01-23 DIAGNOSIS — A419 Sepsis, unspecified organism: Secondary | ICD-10-CM | POA: Diagnosis not present

## 2023-01-23 LAB — RENAL FUNCTION PANEL
Albumin: 2.7 g/dL — ABNORMAL LOW (ref 3.5–5.0)
Anion gap: 15 (ref 5–15)
BUN: 19 mg/dL (ref 6–20)
CO2: 25 mmol/L (ref 22–32)
Calcium: 7.7 mg/dL — ABNORMAL LOW (ref 8.9–10.3)
Chloride: 92 mmol/L — ABNORMAL LOW (ref 98–111)
Creatinine, Ser: 7.38 mg/dL — ABNORMAL HIGH (ref 0.61–1.24)
GFR, Estimated: 8 mL/min — ABNORMAL LOW (ref >60)
Glucose, Bld: 89 mg/dL (ref 70–99)
Phosphorus: 1.7 mg/dL — ABNORMAL LOW (ref 2.5–4.6)
Potassium: 3.6 mmol/L (ref 3.5–5.1)
Sodium: 132 mmol/L — ABNORMAL LOW (ref 135–145)

## 2023-01-23 LAB — CBC
HCT: 31.1 % — ABNORMAL LOW (ref 39.0–52.0)
Hemoglobin: 10 g/dL — ABNORMAL LOW (ref 13.0–17.0)
MCH: 28.2 pg (ref 26.0–34.0)
MCHC: 32.2 g/dL (ref 30.0–36.0)
MCV: 87.9 fL (ref 80.0–100.0)
Platelets: 311 10*3/uL (ref 150–400)
RBC: 3.54 MIL/uL — ABNORMAL LOW (ref 4.22–5.81)
RDW: 20 % — ABNORMAL HIGH (ref 11.5–15.5)
WBC: 10.2 10*3/uL (ref 4.0–10.5)
nRBC: 0.2 % (ref 0.0–0.2)

## 2023-01-23 LAB — MAGNESIUM: Magnesium: 2 mg/dL (ref 1.7–2.4)

## 2023-01-23 MED ORDER — AMIODARONE HCL 200 MG PO TABS
200.0000 mg | ORAL_TABLET | Freq: Two times a day (BID) | ORAL | Status: DC
  Administered 2023-01-23 – 2023-01-24 (×3): 200 mg via ORAL
  Filled 2023-01-23 (×3): qty 1

## 2023-01-23 NOTE — Progress Notes (Signed)
PROGRESS NOTE    [name]  [id number] DOB: 12/17/1965 DOA: 01/10/2023 PCP: Gardiner Rhyme, MD  Brief Narrative:  [name] is a 57 y.o. male with medical history significant of atrial fibrillation (Watchman device implanted), CHF, ESRD on dialysis MWF, DVT of left lower extremity, arterial ulcer of RLE, GERD, HLD, and chronic hypotension on midodrine admitted on 2/26 for A-fib with RVR and soft BP, meeting SIRS criteria for sepsis.  Admitted to ICU at [facility].  Patient had multiple runs of SVT and Cardiology was consulted; recommended amiodarone drip.  Patient continued to have SVT runs intermittently, remained on amiodarone drip with daily boluses, did require adenosine and extra amiodarone bolus on 3/5 of unstable SVT.  Nephrology consulted for HD and WOC consulted for care of arterial ulcer on RLE.   Started cefepime, metronidazole, and linezolid on admission, then narrowed to cefepime and linezolid 2/28, but became febrile again on 2/29.  New blood cultures were obtained on 2/29 due to persistent fevers.  In parallel, WBC continued to trend up from admission through 3/1, then stabilized.  Infectious disease consulted and recommended TTE, which did not show any vegetations.  CXR did show some questionable right lung base pleural effusion versus consolidation consistent with possible pneumonia.  MRSA PCR negative and linezolid stopped.  Patient's wife insisted on discontinuing cefepime in favor of ceftriaxone.  Patient refevered again 3/3 after stopping cefepime.  Restarted cefepime 3/5 with wife's agreement.  Febrile once more 3/5 and CT abdomen/chest/pelvis negative for acute pathology, but ESR and CRP were drawn and found to be elevated.  CT of LE arterial ulcer showed small amount of gas not concerning for abscess formation or active infection per general surgery.  Trialed off antibiotics starting 3/6 and repeated blood cultures.  TEE on 3/7 unremarkable.  3/8, WBCs were trending  down and patient had no fever for greater than 24 hours.  Heart rates are now better controlled and he will be transitioned off IV amiodarone to oral amiodarone 3/10.  Problem List:   Principal Problem:   Sepsis ([place]) Active Problems:   ESRD on dialysis ([place])   GERD (gastroesophageal reflux disease)   Mixed hyperlipidemia   Atrial fibrillation with rapid ventricular response (HCC)   Fever of unknown origin   Hypotension   History of DVT (deep vein thrombosis)   Arterial insufficiency with ischemic ulcer (HCC)   Persistent atrial fibrillation (HCC)   Bilateral lower leg cellulitis   PSVT (paroxysmal supraventricular tachycardia)   Fever   Wound of left leg  Assessment and Plan:  Sepsis, leukocytes trending down No bacteremia identified, evaluation of LLE ulcer and TTE for endocarditis both unremarkable.  ESR and CRP repeated and still elevated.  However, given downtrend in WBCs and lack of fevers, suspect infection is improving.  Patient remains asymptomatic. - Midodrine for hypotension, minimize norepi use - No growth in blood repeat blood Cx @ 1 day - WBC trending down, no fever yesterday - Appreciate Infectious Disease recs:             - SPEP pending              ESRD on hemodialysis MWF Tolerating HD, has AV fistula. - Electrolytes to be repleted, continue to follow - Appreciate Nephrology recs:             - Renal labs daily             - On ESA for anemia  - HD with albumin  today 3/8  Anemia of chronic disease Gradual Hgb trend from 134 to 9.8 this hospitalization.  Iron studies consistent with anemia of chronic disease.  Not currently receiving iron supplementation, has been getting daily blood draws since admission, likely the cause of acute drop.  Hypotension, chronic - Continue high dose midodrine on dialysis days (20 mg); 15 mg on non HD days - He has been weaned off norepinephrine and will try not to restart this unless absolutely needed - Continue to  monitor   Paroxysmal atrial fibrillation PSVT, appears resolved Continues to be tachycardic and in RVR with intermittent SVTs.  Cardiology wondering if patient is dehydrated, counseled him to drink more water.  Will receive albumin with dialysis today.  S/p Watchman device, not a candidate for chronic anticoagulation, discontinued May 2022 due to GI bleed. - Cardiac tele monitoring - Cardiology following closely, appreciate recs: - Amiodarone transition to oral amiodarone 3/10   Arterial ulcer of LLE CT lower extremity not suggestive of abscess or active infection 3/6.  Wound healing poorly and likely needs vascular surgery outpatient visit.  See [place] recs below. - Wound care per [place] recs - Minimize use of vasoconstrictive drugs given poor circulation - Appreciate general surgery consult   History of DVT History of DVT in the left lower extremity, finish 90 days of Eliquis.  Negative DVT US 3/5 in the setting of new calf pain. - Continue to monitor   GERD - Continue famotidine, added omeprazole per patient request yesterday; H2B also important for stress ulcer PPx best practice   Mixed hyperlipidemia - Continue statin   OSA - CPAP nightly   Skin Assessment: I have examined the patient's skin and I agree with the wound assessment as performed by the wound care RN as outlined below:   Wound type: full thickness, s/p debridement by plastics 11/30/2022  Pressure Injury POA: NA, not pressure related  Measurement: see nursing flowsheets, last measurements by plastics 8 cms x 6.25 cms  Wound bed: 100% red moist, appears to be tendon showing at superior portion of wound  Drainage (amount, consistency, odor)  see nursing flowsheet  Periwound: intact  Dressing procedure/placement/frequency: Clean L anterior ankle wound with NS, apply Silver Hydrofiber Kellie Simmering [phone]) cut to fit wound bed daily. Cover with dry gauze and  secure with Kerlix and 4"  Ace wrap Kellie Simmering [phone]).  May  need to moisten silver with NS if stuck to wound bed at dressing changes.   DVT prophylaxis: [place] heparin Code Status: Full Family Communication: Wife 3/8 on phone and at bedside Disposition Plan: Patient is from: Home Anticipated d/c is to: Home Anticipated d/c date is: Pending clinical improvement Patient currently: Pending fever workup Admission Status is: Inpatient Remains inpatient appropriate because: Critical illness and persistent febrile illness   Consultants:  Infectious Disease Cardiology Nephrology [place] General surgery   Procedures:  DVT US 2/27 unremarkable TTE 3/3 w/out vegetations DVT US 3/5 unremarkable TEE 3/7 unremarkable   Antimicrobials:  Metronidazole (2/26, 2/29>3/6) Cefepime (2/27>3/2, 3/5>3/6) Ceftriaxone (3/3>3/5) Linezolid (2/27>3/2)  Subjective: Patient seen and evaluated this a.m. and continues to appear to be in good spirits.  Heart rate appears better controlled with rates mostly in the 90s.  No acute overnight events noted.  Objective: Vitals:   01/23/23 0839 01/23/23 0900 01/23/23 0954 01/23/23 1000  BP:   (!) 95/55   Pulse: 93 94 (!) 101 95  Resp: 16 15 (!) 21 (!) 21  Temp: 98.1 F (36.7 C)  TempSrc: Oral     SpO2: 98% 99% 100% 100%  Weight:      Height:        Intake/Output Summary (Last 24 hours) at 01/23/2023 1031 Last data filed at 01/23/2023 0327 Gross per 24 hour  Intake 540.7 ml  Output --  Net 540.7 ml   Filed Weights   01/22/23 0356 01/22/23 0524 01/23/23 0358  Weight: 102.5 kg 102 kg 104.1 kg    Examination: General: Adult male resting in bed.  No acute distress.  Pleasant appropriate. HEENT: MMM. Lungs: Mild moist crackles at right lung base, otherwise clear to auscultation bilaterally.  Normal work of breathing on room air. Cardiovascular: Regular rhythm, tachycardic.  No murmurs/rubs/gallops. Abdomen: Soft, nondistended.  No tenderness to palpation. Extremities: Left lower extremity ulcer covered with  dressing, no drainage or surrounding erythema.  Capillary refill less than 2 seconds.  No peripheral edema, cyanosis or clubbing. Skin: Dry, warm.  No rashes or lesions grossly except for left lower extremity ulcer. Neuro: Alert and oriented x 4.  No focal neurological deficit.  Data Reviewed: I have personally reviewed following labs and imaging studies.  CBC: Recent Labs  Lab 01/17/23 0318 01/18/23 0431 01/19/23 0418 01/20/23 0344 01/21/23 0452 01/22/23 0428 01/23/23 0408  WBC 14.7* 16.7* 15.3* 12.5* 11.2* 9.9 10.2  NEUTROABS 10.9* 12.5* 11.2*  --   --   --   --   HGB 11.5* 10.3* 10.9* 10.1* 9.8* 10.1* 10.0*  HCT 37.2* 32.0* 33.9* 31.1* 30.6* 31.4* 31.1*  MCV 93.2 89.6 89.9 87.6 88.7 87.0 87.9  PLT 269 238 244 239 253 291 [id number]   Basic Metabolic Panel: Recent Labs  Lab 01/19/23 0418 01/20/23 0344 01/21/23 0452 01/22/23 0428 01/23/23 0408  NA 133* 134* 135 134* 132*  K 3.4* 3.1* 3.3* 3.2* 3.6  CL 91* 92* 94* 94* 92*  CO2 '25 27 25 28 25  '$ GLUCOSE 89 92 83 93 89  BUN 23* 16 23* 13 19  CREATININE 8.14* 6.25* 8.32* 5.78* 7.38*  CALCIUM 7.5* 7.5* 7.5* 7.7* 7.7*  MG 1.9 1.7 1.7 1.6* 2.0  PHOS 2.5 1.9* 2.6 1.8* 1.7*   GFR: Estimated Creatinine Clearance: 14.5 mL/min (A) (by C-G formula based on SCr of 7.38 mg/dL (H)). Liver Function Tests: Recent Labs  Lab 01/19/23 0418 01/20/23 0344 01/21/23 0452 01/22/23 0428 01/23/23 0408  ALBUMIN 2.4* 2.6* 2.4* 2.7* 2.7*   No results for input(s): "LIPASE", "AMYLASE" in the last 168 hours. No results for input(s): "AMMONIA" in the last 168 hours. Coagulation Profile: No results for input(s): "INR", "PROTIME" in the last 168 hours. Cardiac Enzymes: Recent Labs  Lab 01/21/23 0452  CKTOTAL 47*   BNP (last 3 results) No results for input(s): "PROBNP" in the last 8760 hours. HbA1C: No results for input(s): "HGBA1C" in the last 72 hours. CBG: No results for input(s): "GLUCAP" in the last 168 hours. Lipid Profile: No results  for input(s): "CHOL", "HDL", "LDLCALC", "TRIG", "CHOLHDL", "LDLDIRECT" in the last 72 hours. Thyroid Function Tests: No results for input(s): "TSH", "T4TOTAL", "FREET4", "T3FREE", "THYROIDAB" in the last 72 hours. Anemia Panel: Recent Labs    01/21/23 0757  VITAMINB12 1,669*  FOLATE 9.8  FERRITIN 349*  TIBC 142*  IRON 18*  RETICCTPCT 0.6   Sepsis Labs: No results for input(s): "PROCALCITON", "LATICACIDVEN" in the last 168 hours.   Recent Results (from the past 240 hour(s))  Culture, blood (Routine X 2) w Reflex to ID Panel     Status: None  Collection Time: 01/13/23  5:42 PM   Specimen: Right Antecubital; Blood  Result Value Ref Range Status   Specimen Description RIGHT ANTECUBITAL  Final   Special Requests   Final    BOTTLES DRAWN AEROBIC AND ANAEROBIC Blood Culture adequate volume   Culture   Final    NO GROWTH 6 DAYS Performed at [facility], [address] [place], [place] [address]    Report Status 01/19/2023 FINAL  Final  Culture, blood (Routine X 2) w Reflex to ID Panel     Status: None   Collection Time: 01/13/23  5:42 PM   Specimen: BLOOD RIGHT HAND  Result Value Ref Range Status   Specimen Description BLOOD RIGHT HAND  Final   Special Requests   Final    BOTTLES DRAWN AEROBIC AND ANAEROBIC Blood Culture adequate volume   Culture   Final    NO GROWTH 6 DAYS Performed at [facility], [address] [place], [name] [address]    Report Status 01/19/2023 FINAL  Final  Culture, blood (Routine X 2) w Reflex to ID Panel     Status: None (Preliminary result)   Collection Time: 01/20/23  3:33 PM   Specimen: BLOOD LEFT HAND  Result Value Ref Range Status   Specimen Description BLOOD LEFT HAND  Final   Special Requests   Final    BOTTLES DRAWN AEROBIC AND ANAEROBIC Blood Culture results may not be optimal due to an excessive volume of blood received in culture bottles   Culture   Final    NO GROWTH 3 DAYS Performed at [facility], [address],  [place], [place] [address]    Report Status PENDING  Incomplete  Culture, blood (Routine X 2) w Reflex to ID Panel     Status: None (Preliminary result)   Collection Time: 01/20/23  3:54 PM   Specimen: BLOOD RIGHT HAND  Result Value Ref Range Status   Specimen Description BLOOD RIGHT HAND  Final   Special Requests   Final    BOTTLES DRAWN AEROBIC AND ANAEROBIC Blood Culture results may not be optimal due to an excessive volume of blood received in culture bottles   Culture   Final    NO GROWTH 3 DAYS Performed at [facility], [address], [place], [place] [address]    Report Status PENDING  Incomplete     Radiology Studies: No results found.  Scheduled Meds:  amiodarone  200 mg Oral BID   aspirin EC  81 mg Oral q AM   atorvastatin  80 mg Oral QHS   calcium carbonate  1,500 mg Oral QHS   Chlorhexidine Gluconate Cloth  6 each Topical Q0600   Chlorhexidine Gluconate Cloth  6 each Topical Q0600   darbepoetin (ARANESP) injection - NON-DIALYSIS  100 mcg Subcutaneous Q Fri-1800   famotidine  20 mg Oral QHS   heparin  5,000 Units Subcutaneous Q8H   midodrine  15 mg Oral TID WC   pantoprazole  40 mg Oral Q breakfast   saccharomyces boulardii  250 mg Oral BID   tamsulosin  0.4 mg Oral QPM   Continuous Infusions:  sodium chloride Stopped (01/22/23 1056)   albumin human Stopped (01/22/23 1159)     LOS: 13 days   Time spent: 35 minutes  Jenifer Struve D Fredrich Cory,DO [facility]  If 7PM-7AM, please contact night-coverage [email or web address] 01/23/2023, 10:31 AM

## 2023-01-24 DIAGNOSIS — Z95818 Presence of other cardiac implants and grafts: Secondary | ICD-10-CM

## 2023-01-24 DIAGNOSIS — A419 Sepsis, unspecified organism: Secondary | ICD-10-CM | POA: Diagnosis not present

## 2023-01-24 DIAGNOSIS — R6520 Severe sepsis without septic shock: Secondary | ICD-10-CM | POA: Diagnosis not present

## 2023-01-24 LAB — RENAL FUNCTION PANEL
Albumin: 2.6 g/dL — ABNORMAL LOW (ref 3.5–5.0)
Anion gap: 13 (ref 5–15)
BUN: 26 mg/dL — ABNORMAL HIGH (ref 6–20)
CO2: 25 mmol/L (ref 22–32)
Calcium: 7.7 mg/dL — ABNORMAL LOW (ref 8.9–10.3)
Chloride: 94 mmol/L — ABNORMAL LOW (ref 98–111)
Creatinine, Ser: 9.25 mg/dL — ABNORMAL HIGH (ref 0.61–1.24)
GFR, Estimated: 6 mL/min — ABNORMAL LOW (ref >60)
Glucose, Bld: 92 mg/dL (ref 70–99)
Phosphorus: 2 mg/dL — ABNORMAL LOW (ref 2.5–4.6)
Potassium: 3.6 mmol/L (ref 3.5–5.1)
Sodium: 132 mmol/L — ABNORMAL LOW (ref 135–145)

## 2023-01-24 LAB — PROTEIN ELECTROPHORESIS, SERUM
A/G Ratio: 1.1 (ref 0.7–1.7)
Albumin ELP: 2.7 g/dL — ABNORMAL LOW (ref 2.9–4.4)
Alpha-1-Globulin: 0.3 g/dL (ref 0.0–0.4)
Alpha-2-Globulin: 0.8 g/dL (ref 0.4–1.0)
Beta Globulin: 0.6 g/dL — ABNORMAL LOW (ref 0.7–1.3)
Gamma Globulin: 0.6 g/dL (ref 0.4–1.8)
Globulin, Total: 2.4 g/dL (ref 2.2–3.9)
Total Protein ELP: 5.1 g/dL — ABNORMAL LOW (ref 6.0–8.5)

## 2023-01-24 LAB — CBC
HCT: 31.8 % — ABNORMAL LOW (ref 39.0–52.0)
Hemoglobin: 10.2 g/dL — ABNORMAL LOW (ref 13.0–17.0)
MCH: 28.7 pg (ref 26.0–34.0)
MCHC: 32.1 g/dL (ref 30.0–36.0)
MCV: 89.3 fL (ref 80.0–100.0)
Platelets: 356 10*3/uL (ref 150–400)
RBC: 3.56 MIL/uL — ABNORMAL LOW (ref 4.22–5.81)
RDW: 20.3 % — ABNORMAL HIGH (ref 11.5–15.5)
WBC: 10.8 10*3/uL — ABNORMAL HIGH (ref 4.0–10.5)
nRBC: 0.2 % (ref 0.0–0.2)

## 2023-01-24 LAB — MAGNESIUM: Magnesium: 2.1 mg/dL (ref 1.7–2.4)

## 2023-01-24 MED ORDER — PHENOL 1.4 % MT LIQD: 1.0000 | OROMUCOSAL | Status: DC | PRN

## 2023-01-24 MED ORDER — POLYVINYL ALCOHOL 1.4 % OP SOLN: 1.0000 [drp] | OPHTHALMIC | Status: DC | PRN

## 2023-01-24 MED ORDER — ALBUMIN HUMAN 25 % IV SOLN
INTRAVENOUS | Status: AC
  Administered 2023-01-24: 25 g via INTRAVENOUS
  Filled 2023-01-24: qty 100

## 2023-01-24 NOTE — Progress Notes (Addendum)
ID Brief Note   Last spike of temperature was 7 pm on 3/8 and afebrile since then  Leukocytosis has resolved, mild elevation at 10.8 today Off pressors, on midodrine for chronic hypotension   Blood cx 3/7 1533 hrs as well as 1554 hrs NG in 4 days  3/6 CMV DNA negative  3/7 SPEP is pending  No clear infective etiology found so far Work up for non infective etiology per primary  ID will SO, please call with questions  Rosiland Oz, MD Infectious Disease Physician [facility] [address] [place], [place] [address] Phone: [phone]  Fax: [phone]

## 2023-01-24 NOTE — Progress Notes (Signed)
Received patient in bed in ICU Alert and oriented.  Informed consent signed and in chart.   TX duration:3.75. Pt had HR 57 during [place].  Patient tolerated well.   without acute distress.  Hand-off given to patient's nurse.   Access used: AVF Access issues: none  Total UF removed: 2.1 L Medication(s) given: Albumin 25 g IV. Post HD VS: 124/81 P 96 R 20 Post HD weight: 102 Kg.   [facility]

## 2023-01-24 NOTE — Progress Notes (Signed)
PROGRESS NOTE    [name]  [id number] DOB: [birth year] DOA: 01/10/2023 PCP: Gardiner Rhyme, MD  Brief Narrative:  [name] is a 57 y.o. male with medical history significant of atrial fibrillation (Watchman device implanted), CHF, ESRD on dialysis MWF, DVT of left lower extremity, arterial ulcer of RLE, GERD, HLD, and chronic hypotension on midodrine admitted on 2/26 for A-fib with RVR and soft BP, meeting SIRS criteria for sepsis.  Admitted to ICU at [facility].  Patient had multiple runs of SVT and Cardiology was consulted; recommended amiodarone drip.  Patient continued to have SVT runs intermittently, remained on amiodarone drip with daily boluses, did require adenosine and extra amiodarone bolus on 3/5 of unstable SVT.  Nephrology consulted for HD and WOC consulted for care of arterial ulcer on RLE.   Started cefepime, metronidazole, and linezolid on admission, then narrowed to cefepime and linezolid 2/28, but became febrile again on 2/29.  New blood cultures were obtained on 2/29 due to persistent fevers.  In parallel, WBC continued to trend up from admission through 3/1, then stabilized.  Infectious disease consulted and recommended TTE, which did not show any vegetations.  CXR did show some questionable right lung base pleural effusion versus consolidation consistent with possible pneumonia.  Patient's wife insisted on discontinuing cefepime in favor of ceftriaxone.  Patient refevered again 3/3 after stopping cefepime.  Restarted cefepime 3/5 with wife's agreement.  Febrile once more 3/5 and CT abdomen/chest/pelvis negative for acute pathology, but ESR and CRP were drawn and found to be elevated.  CT of LE arterial ulcer showed small amount of gas not concerning for abscess formation or active infection per general surgery.  Trialed off antibiotics starting 3/6 and repeated blood cultures.  TEE on 3/7 unremarkable.  3/8, WBCs were trending down and patient had no fever for greater  than 24 hours.  Heart rates became better controlled and transitioned to oral amiodarone 3/10.  Problem List:   Principal Problem:   Sepsis ([place]) Active Problems:   Atrial fibrillation with rapid ventricular response (HCC)   Fever of unknown origin   GERD (gastroesophageal reflux disease)   ESRD on dialysis ([place])   Hypotension   Mixed hyperlipidemia   History of DVT (deep vein thrombosis)   Arterial insufficiency with ischemic ulcer (HCC)   Persistent atrial fibrillation (HCC)   Bilateral lower leg cellulitis   PSVT (paroxysmal supraventricular tachycardia)   Fever   Wound of left leg  Assessment and Plan:  Sepsis, resolved with WBCs borderline normal and afebrile No bacteremia identified, evaluation of LLE ulcer and TTE for endocarditis both unremarkable.  ESR and CRP repeated and still elevated.  However, given downtrend in WBCs and lack of fevers, suspect infection is improving.  Patient remains asymptomatic. - Midodrine for hypotension, minimize norepi use - No growth in blood repeat blood Cx @ 1 day - WBC trending down, no fever yesterday - Appreciate Infectious Disease recs:             - SPEP pending              ESRD on hemodialysis MWF Tolerating HD, has AV fistula.  Bps have been soft.  Will be good to discharge tomorrow if tolerates HD today well. - Electrolytes to be repleted, continue to follow - Appreciate Nephrology recs:             - Renal labs daily             - On ESA  for anemia   Hypotension, chronic - Continue high dose midodrine on dialysis days (20 mg); 15 mg on non HD days - Continue to monitor  Anemia of chronic disease Gradual Hgb trend from 134 to 9.8 this hospitalization.  Iron studies consistent with anemia of chronic disease.  Not currently receiving iron supplementation, has been getting daily blood draws since admission, likely contributory.   Paroxysmal atrial fibrillation, resolved PSVT, appears resolved  S/p Watchman device, not a  candidate for chronic anticoagulation, discontinued May 2022 due to GI bleed. - Cardiac tele monitoring - Cardiology following closely, appreciate recs: - Amiodarone transition to oral 3/10   Arterial ulcer of LLE CT lower extremity not suggestive of abscess or active infection 3/6.  Wound healing poorly and likely needs vascular surgery outpatient visit.  See [place] recs below. - Wound care per [place] recs - Minimize use of vasoconstrictive drugs given poor circulation - Appreciate general surgery consult   History of DVT History of DVT in the left lower extremity, finish 90 days of Eliquis.  Negative DVT US 3/5 in the setting of new calf pain. - Continue to monitor   GERD - Continue famotidine, added omeprazole per patient request yesterday; H2B also important for stress ulcer PPx best practice   Mixed hyperlipidemia - Continue statin   OSA - CPAP nightly   Skin Assessment: I have examined the patient's skin and I agree with the wound assessment as performed by the wound care RN as outlined below:   Wound type: full thickness, s/p debridement by plastics 11/30/2022  Pressure Injury POA: NA, not pressure related  Measurement: see nursing flowsheets, last measurements by plastics 8 cms x 6.25 cms  Wound bed: 100% red moist, appears to be tendon showing at superior portion of wound  Drainage (amount, consistency, odor)  see nursing flowsheet  Periwound: intact  Dressing procedure/placement/frequency: Clean L anterior ankle wound with NS, apply Silver Hydrofiber Kellie Simmering [phone]) cut to fit wound bed daily. Cover with dry gauze and  secure with Kerlix and 4"  Ace wrap Kellie Simmering [phone]).  May need to moisten silver with NS if stuck to wound bed at dressing changes.  DVT prophylaxis: [place] heparin Code Status: Full Family Communication: Wife 3/8 on phone and at bedside Disposition Plan: Patient is from: Home Anticipated d/c is to: Home Anticipated d/c date is: Pending clinical  improvement Patient currently: Pending fever workup Admission Status is: Inpatient Remains inpatient appropriate because: Critical illness and persistent febrile illness   Consultants:  Infectious Disease Cardiology Nephrology [place] General surgery   Procedures:  DVT US 2/27 unremarkable TTE 3/3 w/out vegetations DVT US 3/5 unremarkable TEE 3/7 unremarkable   Antimicrobials:  Metronidazole (2/26, 2/29>3/6) Cefepime (2/27>3/2, 3/5>3/6) Ceftriaxone (3/3>3/5) Linezolid (2/27>3/2)  Subjective: Patient seen and evaluated today with no new acute complaints or concerns. No acute concerns or events noted overnight.  Reports he is feeling well and has no concerns.  Ready to leave the hospital if cleared.  Objective: Vitals:   01/23/23 2200 01/23/23 2239 01/23/23 2300 01/24/23 0550  BP: 101/62  (!) 89/48   Pulse:      Resp: 20  (!) 27   Temp:    98.6 F (37 C)  TempSrc:    Oral  SpO2:  96% 96%   Weight:      Height:        Intake/Output Summary (Last 24 hours) at 01/24/2023 0709 Last data filed at 01/23/2023 1149 Gross per 24 hour  Intake 98.57  ml  Output --  Net 98.57 ml   Filed Weights   01/22/23 0356 01/22/23 0524 01/23/23 0358  Weight: 102.5 kg 102 kg 104.1 kg    Examination: General: Adult male resting in bed.  No acute distress.  Pleasant and appropriate. HEENT: PERRLA.  MMM. Lungs: CTAB.  Normal work of breathing on room air. Cardiovascular: Borderline tachycardic, regular rhythm with occasional irregular beat.  Normal S1/S2 no murmurs/rubs/gallops. Abdomen: Soft, nondistended.  No tenderness to palpation. Extremities: Ace wrap and dressing in place on left lower extremity. Skin: Left lower extremity arterial ulcer not examined, protected by wrap and dressing.  No erythema or drainage surrounding. Neuro: Alert and oriented x 4.  No focal neurological deficit.  Data Reviewed: I have personally reviewed following labs and imaging studies.  CBC: Recent Labs   Lab 01/26/23 0431 01/19/23 0418 01/20/23 0344 01/21/23 0452 01/22/23 0428 01/23/23 0408 01/24/23 0301  WBC 16.7* 15.3* 12.5* 11.2* 9.9 10.2 10.8*  NEUTROABS 12.5* 11.2*  --   --   --   --   --   HGB 10.3* 10.9* 10.1* 9.8* 10.1* 10.0* 10.2*  HCT 32.0* 33.9* 31.1* 30.6* 31.4* 31.1* 31.8*  MCV 89.6 89.9 87.6 88.7 87.0 87.9 89.3  PLT 238 244 239 253 291 311 [id number]   Basic Metabolic Panel: Recent Labs  Lab 01/20/23 0344 01/21/23 0452 01/22/23 0428 01/23/23 0408 01/24/23 0301  NA 134* 135 134* 132* 132*  K 3.1* 3.3* 3.2* 3.6 3.6  CL 92* 94* 94* 92* 94*  CO2 '27 25 28 25 25  '$ GLUCOSE 92 83 93 89 92  BUN 16 23* 13 19 26*  CREATININE 6.25* 8.32* 5.78* 7.38* 9.25*  CALCIUM 7.5* 7.5* 7.7* 7.7* 7.7*  MG 1.7 1.7 1.6* 2.0 2.1  PHOS 1.9* 2.6 1.8* 1.7* 2.0*   GFR: Estimated Creatinine Clearance: 11.6 mL/min (A) (by C-G formula based on SCr of 9.25 mg/dL (H)). Liver Function Tests: Recent Labs  Lab 01/20/23 0344 01/21/23 0452 01/22/23 0428 01/23/23 0408 01/24/23 0301  ALBUMIN 2.6* 2.4* 2.7* 2.7* 2.6*   No results for input(s): "LIPASE", "AMYLASE" in the last 168 hours. No results for input(s): "AMMONIA" in the last 168 hours. Coagulation Profile: No results for input(s): "INR", "PROTIME" in the last 168 hours. Cardiac Enzymes: Recent Labs  Lab 01/21/23 0452  CKTOTAL 47*   BNP (last 3 results) No results for input(s): "PROBNP" in the last 8760 hours. HbA1C: No results for input(s): "HGBA1C" in the last 72 hours. CBG: No results for input(s): "GLUCAP" in the last 168 hours. Lipid Profile: No results for input(s): "CHOL", "HDL", "LDLCALC", "TRIG", "CHOLHDL", "LDLDIRECT" in the last 72 hours. Thyroid Function Tests: No results for input(s): "TSH", "T4TOTAL", "FREET4", "T3FREE", "THYROIDAB" in the last 72 hours. Anemia Panel: Recent Labs    01/21/23 0757  VITAMINB12 1,669*  FOLATE 9.8  FERRITIN 349*  TIBC 142*  IRON 18*  RETICCTPCT 0.6   Sepsis Labs: No results  for input(s): "PROCALCITON", "LATICACIDVEN" in the last 168 hours.  Recent Results (from the past 240 hour(s))  Culture, blood (Routine X 2) w Reflex to ID Panel     Status: None (Preliminary result)   Collection Time: 01/20/23  3:33 PM   Specimen: BLOOD LEFT HAND  Result Value Ref Range Status   Specimen Description BLOOD LEFT HAND  Final   Special Requests   Final    BOTTLES DRAWN AEROBIC AND ANAEROBIC Blood Culture results may not be optimal due to an excessive volume of blood received  in culture bottles   Culture   Final    NO GROWTH 4 DAYS Performed at [facility], [address] [place], [place] [address]    Report Status PENDING  Incomplete  Culture, blood (Routine X 2) w Reflex to ID Panel     Status: None (Preliminary result)   Collection Time: 01/20/23  3:54 PM   Specimen: BLOOD RIGHT HAND  Result Value Ref Range Status   Specimen Description BLOOD RIGHT HAND  Final   Special Requests   Final    BOTTLES DRAWN AEROBIC AND ANAEROBIC Blood Culture results may not be optimal due to an excessive volume of blood received in culture bottles   Culture   Final    NO GROWTH 4 DAYS Performed at [facility], [address], [place], [place] [address]    Report Status PENDING  Incomplete     Radiology Studies: No results found.  Scheduled Meds:  amiodarone  200 mg Oral BID   aspirin EC  81 mg Oral q AM   atorvastatin  80 mg Oral QHS   calcium carbonate  1,500 mg Oral QHS   Chlorhexidine Gluconate Cloth  6 each Topical Q0600   Chlorhexidine Gluconate Cloth  6 each Topical Q0600   darbepoetin (ARANESP) injection - NON-DIALYSIS  100 mcg Subcutaneous Q Fri-1800   famotidine  20 mg Oral QHS   heparin  5,000 Units Subcutaneous Q8H   midodrine  15 mg Oral TID WC   pantoprazole  40 mg Oral Q breakfast   saccharomyces boulardii  250 mg Oral BID   tamsulosin  0.4 mg Oral QPM   Continuous Infusions:  sodium chloride Stopped (01/22/23 1056)   albumin human Stopped (01/22/23 1159)      LOS: 14 days   Time spent: 35 minutes  Raneshia Derick, MS4 [facility]  If 7PM-7AM, please contact night-coverage [email or web address] 01/24/2023, 7:09 AM

## 2023-01-24 NOTE — Progress Notes (Addendum)
Progress Note  Patient Name: [name] Date of Encounter: 01/24/2023  Primary Cardiologist: None  Subjective   Telemetry reviewed, HR stayed less than 100. EKG today reviewed, junctional rhythm, probably junctional. QTc interval appears to be prolonged. Patient denies any symptoms, chest pain or SOB.  No palpitations.   Inpatient Medications    Scheduled Meds:  amiodarone  200 mg Oral BID   aspirin EC  81 mg Oral q AM   atorvastatin  80 mg Oral QHS   calcium carbonate  1,500 mg Oral QHS   Chlorhexidine Gluconate Cloth  6 each Topical Q0600   Chlorhexidine Gluconate Cloth  6 each Topical Q0600   darbepoetin (ARANESP) injection - NON-DIALYSIS  100 mcg Subcutaneous Q Fri-1800   famotidine  20 mg Oral QHS   heparin  5,000 Units Subcutaneous Q8H   midodrine  15 mg Oral TID WC   pantoprazole  40 mg Oral Q breakfast   saccharomyces boulardii  250 mg Oral BID   tamsulosin  0.4 mg Oral QPM   Continuous Infusions:  sodium chloride Stopped (01/22/23 1056)   PRN Meds: acetaminophen **OR** acetaminophen, diphenhydrAMINE, heparin, heparin, lidocaine (PF), lidocaine-prilocaine, loperamide, LORazepam, LORazepam, ondansetron **OR** ondansetron (ZOFRAN) IV, mouth rinse, oxyCODONE, pentafluoroprop-tetrafluoroeth   Vital Signs    Vitals:   01/24/23 1037 01/24/23 1040 01/24/23 1045 01/24/23 1100  BP: 91/60 (!) 88/51 (!) 111/56 116/61  Pulse: 95 95 97 94  Resp: (!) 23 (!) 22 (!) 21 16  Temp: 98.6 F (37 C)     TempSrc: Oral     SpO2: 92% 90% 94% 94%  Weight:      Height:        Intake/Output Summary (Last 24 hours) at 01/24/2023 1122 Last data filed at 01/23/2023 1149 Gross per 24 hour  Intake 98.57 ml  Output --  Net 98.57 ml   Filed Weights   01/22/23 0524 01/23/23 0358 01/24/23 1036  Weight: 102 kg 104.1 kg 104 kg    Telemetry     Personally reviewed, heart rate stayed less than 100  ECG    EKG reviewed today,   Physical Exam   GEN: No acute distress.    Neck: No JVD. Cardiac: RRR, no murmur, rub, or gallop.  Respiratory: Nonlabored. Clear to auscultation bilaterally. GI: Soft, nontender, bowel sounds present. MS: No edema; No deformity. Neuro:  Nonfocal. Psych: Alert and oriented x 3. Normal affect.  Labs    Chemistry Recent Labs  Lab 01/22/23 0428 01/23/23 0408 01/24/23 0301  NA 134* 132* 132*  K 3.2* 3.6 3.6  CL 94* 92* 94*  CO2 '28 25 25  '$ GLUCOSE 93 89 92  BUN 13 19 26*  CREATININE 5.78* 7.38* 9.25*  CALCIUM 7.7* 7.7* 7.7*  ALBUMIN 2.7* 2.7* 2.6*  GFRNONAA 11* 8* 6*  ANIONGAP '12 15 13     '$ Hematology Recent Labs  Lab 01/22/23 0428 01/23/23 0408 01/24/23 0301  WBC 9.9 10.2 10.8*  RBC 3.61* 3.54* 3.56*  HGB 10.1* 10.0* 10.2*  HCT 31.4* 31.1* 31.8*  MCV 87.0 87.9 89.3  MCH 28.0 28.2 28.7  MCHC 32.2 32.2 32.1  RDW 19.4* 20.0* 20.3*  PLT 291 311 356    Cardiac EnzymesNo results for input(s): "TROPONINIHS" in the last 720 hours.  BNPNo results for input(s): "BNP", "PROBNP" in the last 168 hours.   DDimerNo results for input(s): "DDIMER" in the last 168 hours.   Radiology    No results found.  Cardiac Studies  Echocardiogram and TEE  from 01/2023 LVEF normal No vegetations   Assessment & Plan    # Paroxysmal SVT/short RP tachycardia, resolved -Telemetry reviewed, heart rate stayed less than 100 since the dialysis. He also has been drinking more p.o. fluids than before. EKG today reviewed, junctional rhythm. QTc interval appears to be prolonged. Discontinue Amiodarone. Will place EP referral for EP study +/- SVT ablation. -No room on rate controlling agents due to chronic hypotension from midodrine. -Patient has a primary cardiologist Dr. Hector Shade in [place]. Follow-up with his cardiologist outpatient.  # Paroxysmal A-fib s/p Watchman device -Not on rate controlling agents due to chronic hypotension from midodrine -Not on AC due to GI bleed issues  # Chronic hypotension -Has been on midodrine at  home  [place] will sign off.   Medication Recommendations: Discontinue amiodarone Other recommendations (labs, testing, etc): None Follow up as an outpatient: Ambulatory referral to cardiac electrophysiology for EP study +/- SVT ablation.    I have spent a total of 33 minutes with patient reviewing chart , telemetry, EKGs, labs and examining patient as well as establishing an assessment and plan that was discussed with the patient.  > 50% of time was spent in direct patient care.     Signed, Fleeta Kunde P Abella Shugart, MD  01/24/2023, 11:22 AM

## 2023-01-24 NOTE — Progress Notes (Signed)
[name], male   DOB: [birth year], 57 y.o.   MRN: [id number] S: Feels well, no issues overnight. O:BP (!) 89/48   Pulse 100   Temp 98.6 F (37 C) (Oral)   Resp (!) 27   Ht '6\' 1"'$  (1.854 m)   Wt 104.1 kg   SpO2 96%   BMI 30.28 kg/m   Intake/Output Summary (Last 24 hours) at 01/24/2023 0840 Last data filed at 01/23/2023 1149 Gross per 24 hour  Intake 98.57 ml  Output --  Net 98.57 ml   Intake/Output: I/O last 3 completed shifts: In: 268.1 [I.V.:268.1] Out: -   Intake/Output this shift:  No intake/output data recorded. Weight change:  Gen: NAD CVS: RRR Resp: CTA Abd: +Bs, soft, NT/ND Ext: no edema, LAVF +T/B  Recent Labs  Lab 01/18/23 0431 01/19/23 0418 01/20/23 0344 01/21/23 0452 01/22/23 0428 01/23/23 0408 01/24/23 0301  NA 132* 133* 134* 135 134* 132* 132*  K 3.3* 3.4* 3.1* 3.3* 3.2* 3.6 3.6  CL 92* 91* 92* 94* 94* 92* 94*  CO2 20* '25 27 25 28 25 25  '$ GLUCOSE 90 89 92 83 93 89 92  BUN 41* 23* 16 23* 13 19 26*  CREATININE 12.31* 8.14* 6.25* 8.32* 5.78* 7.38* 9.25*  ALBUMIN 2.3* 2.4* 2.6* 2.4* 2.7* 2.7* 2.6*  CALCIUM 7.4* 7.5* 7.5* 7.5* 7.7* 7.7* 7.7*  PHOS 3.2 2.5 1.9* 2.6 1.8* 1.7* 2.0*   Liver Function Tests: Recent Labs  Lab 01/22/23 0428 01/23/23 0408 01/24/23 0301  ALBUMIN 2.7* 2.7* 2.6*   No results for input(s): "LIPASE", "AMYLASE" in the last 168 hours. No results for input(s): "AMMONIA" in the last 168 hours. CBC: Recent Labs  Lab 01/18/23 0431 01/19/23 0418 01/20/23 0344 01/21/23 0452 01/22/23 0428 01/23/23 0408 01/24/23 0301  WBC 16.7* 15.3* 12.5* 11.2* 9.9 10.2 10.8*  NEUTROABS 12.5* 11.2*  --   --   --   --   --   HGB 10.3* 10.9* 10.1* 9.8* 10.1* 10.0* 10.2*  HCT 32.0* 33.9* 31.1* 30.6* 31.4* 31.1* 31.8*  MCV 89.6 89.9 87.6 88.7 87.0 87.9 89.3  PLT 238 244 239 253 291 311 356   Cardiac Enzymes: Recent Labs  Lab 01/21/23 0452  CKTOTAL 47*   CBG: No results for input(s): "GLUCAP" in the last 168 hours.  Iron  Studies:  Recent Labs    01/21/23 0757  IRON 18*  TIBC 142*  FERRITIN 349*   Studies/Results: No results found.  amiodarone  200 mg Oral BID   aspirin EC  81 mg Oral q AM   atorvastatin  80 mg Oral QHS   calcium carbonate  1,500 mg Oral QHS   Chlorhexidine Gluconate Cloth  6 each Topical Q0600   Chlorhexidine Gluconate Cloth  6 each Topical Q0600   darbepoetin (ARANESP) injection - NON-DIALYSIS  100 mcg Subcutaneous Q Fri-1800   famotidine  20 mg Oral QHS   heparin  5,000 Units Subcutaneous Q8H   midodrine  15 mg Oral TID WC   pantoprazole  40 mg Oral Q breakfast   saccharomyces boulardii  250 mg Oral BID   tamsulosin  0.4 mg Oral QPM    BMET    Component Value Date/Time   NA 132 (L) 01/24/2023 0301   K 3.6 01/24/2023 0301   CL 94 (L) 01/24/2023 0301   CO2 25 01/24/2023 0301   GLUCOSE 92 01/24/2023 0301   BUN 26 (H) 01/24/2023 0301   CREATININE 9.25 (H) 01/24/2023 0301   CALCIUM 7.7 (  L) 01/24/2023 0301   GFRNONAA 6 (L) 01/24/2023 0301   GFRAA 31 (L) 11/25/2016 0549   CBC    Component Value Date/Time   WBC 10.8 (H) 01/24/2023 0301   RBC 3.56 (L) 01/24/2023 0301   HGB 10.2 (L) 01/24/2023 0301   HCT 31.8 (L) 01/24/2023 0301   PLT 356 01/24/2023 0301   MCV 89.3 01/24/2023 0301   MCH 28.7 01/24/2023 0301   MCHC 32.1 01/24/2023 0301   RDW 20.3 (H) 01/24/2023 0301   LYMPHSABS 1.1 01/19/2023 0418   MONOABS 1.6 (H) 01/19/2023 0418   EOSABS 1.2 (H) 01/19/2023 0418   BASOSABS 0.1 01/19/2023 0418     HD orders at [facility] MWF 3 hours 45 min using AVF- 15 gauge needles 400 BFR/500 DFR- 2/2.5 bath, temp 36.5- gets heparin 1000 load, then 200 per hour, also on venofer 50 per week EDW 94.5    Assessment/Plan:   FUO/Sepsis - unclear source, cultures negative.  TEE negative for culture negative SBE on 01/20/23.  Afebrile today and repeat blood cultures sent yesterday.  CT of left leg without evidence of drainable abscess.  Antibiotics on hold for now. ESRD -  continue with HD on MWF schedule.  Had some issues with PSVT during HD on 01/21/23.  Will plan for HD today and see how he tolerates it. HTN/volume - pt hypotensive and UF limited due to tachycardia and hypotension.  Will plan for HD today with IV albumin and high dose midodrine.  Continue with MWF schedule. Anemia of ESRD - hgb stable, given ESA and IV iron. 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 PSVT- cardiology following and on po amiodarone.  Per Dr. Harl Bowie. P atrial fibrillation - s/p watchman device due to h/o GIB. On IV amio as above. Hypokalemia - will use 4K bath with HD today and follow.    Donetta Potts, MD [facility]

## 2023-01-25 LAB — RENAL FUNCTION PANEL
Albumin: 2.8 g/dL — ABNORMAL LOW (ref 3.5–5.0)
Anion gap: 12 (ref 5–15)
BUN: 18 mg/dL (ref 6–20)
CO2: 29 mmol/L (ref 22–32)
Calcium: 7.9 mg/dL — ABNORMAL LOW (ref 8.9–10.3)
Chloride: 94 mmol/L — ABNORMAL LOW (ref 98–111)
Creatinine, Ser: 7.11 mg/dL — ABNORMAL HIGH (ref 0.61–1.24)
GFR, Estimated: 8 mL/min — ABNORMAL LOW (ref >60)
Glucose, Bld: 86 mg/dL (ref 70–99)
Phosphorus: 1.7 mg/dL — ABNORMAL LOW (ref 2.5–4.6)
Potassium: 3.8 mmol/L (ref 3.5–5.1)
Sodium: 135 mmol/L (ref 135–145)

## 2023-01-25 LAB — CBC
HCT: 31.4 % — ABNORMAL LOW (ref 39.0–52.0)
Hemoglobin: 9.6 g/dL — ABNORMAL LOW (ref 13.0–17.0)
MCH: 28 pg (ref 26.0–34.0)
MCHC: 30.6 g/dL (ref 30.0–36.0)
MCV: 91.5 fL (ref 80.0–100.0)
Platelets: 378 10*3/uL (ref 150–400)
RBC: 3.43 MIL/uL — ABNORMAL LOW (ref 4.22–5.81)
RDW: 21.2 % — ABNORMAL HIGH (ref 11.5–15.5)
WBC: 10.7 10*3/uL — ABNORMAL HIGH (ref 4.0–10.5)
nRBC: 0 % (ref 0.0–0.2)

## 2023-01-25 LAB — MAGNESIUM: Magnesium: 1.9 mg/dL (ref 1.7–2.4)

## 2023-01-25 NOTE — TOC Transition Note (Signed)
Transition of Care [facility]) - CM/SW Discharge Note   Patient Details  Name: [name] MRN: [id number] Date of Birth: 10/11/1966  Transition of Care [facility]) CM/SW Contact:  Boneta Lucks, RN Phone Number: 01/25/2023, 11:36 AM   Clinical Narrative:   Patient discharging home. PT is now recommending HHPT. He is agreeable. Georgina Snell with [facility] accepted the referral. Added to AVS.  Final next level of care: [place] Barriers to Discharge: Barriers Resolved   Patient Goals and CMS Choice CMS Medicare.gov Compare Post Acute Care list provided to:: Patient Choice offered to / list presented to : Patient  Discharge Placement      Patient and family notified of of transfer: 01/25/23  Discharge Plan and Services Additional resources added to the After Visit Summary for          [facility] Arranged: PT HH Agency: [place] Date [place]: 01/25/23 Time [place]: [id number] Representative spoke with at [place]: [place] ([place]) Interventions SDOH Screenings   Food Insecurity: No Food Insecurity (01/11/2023)  Housing: Low Risk  (01/11/2023)  Transportation Needs: No Transportation Needs (01/11/2023)  Utilities: Not At Risk (01/11/2023)  Tobacco Use: Low Risk  (01/20/2023)     Readmission Risk Interventions    01/25/2023   11:36 AM  Readmission Risk Prevention Plan  Transportation Screening Complete  Medication Review ([place]) Complete  PCP or Specialist appointment within 3-5 days of discharge Complete  HRI or [place] Complete  SW Recovery Care/Counseling Consult Complete  [place] Not Applicable

## 2023-01-25 NOTE — Discharge Summary (Signed)
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       1.24 m/s     Systemic VTI:  0.20 m MV Vmean:      73.3 cm/s    Systemic Diam: 2.10 cm MV Decel Time: 180 msec MV E velocity: 109.00 cm/s Oswaldo Milian MD Electronically signed by Oswaldo Milian MD Signature Date/Time: 01/16/2023/1:23:23 PM    Final    DG CHEST PORT 1 VIEW  Result Date: 01/16/2023 CLINICAL DATA:  Sepsis. EXAM: PORTABLE CHEST 1 VIEW COMPARISON:  Chest x-ray January 14, 2023 FINDINGS: Opacity remains in the periphery of the right lung base, mildly improved in the interval. The cardiomediastinal silhouette is stable. No pneumothorax. No nodules or masses. No focal infiltrates. IMPRESSION: Opacity in the periphery of the right lung base is mildly improved in the interval. No other interval changes. Electronically Signed   By: Dorise Bullion III M.D.   On: 01/16/2023 08:57   DG CHEST PORT 1 VIEW  Result Date: 01/14/2023 CLINICAL DATA:  Fever with concern for sepsis EXAM: PORTABLE CHEST 1 VIEW COMPARISON:  Chest radiograph dated 01/10/2023 FINDINGS: Lines/tubes: Unchanged metallic radiodensities projecting  over the right hemithorax. Chest: Decreased lung volumes with increased bilateral bronchovascular crowding and bibasilar opacities. Pleura: Unchanged blunting of the right costophrenic angle. No pneumothorax. Heart/mediastinum: Enlarged cardiomediastinal silhouette is likely projectional. Bones: Partially imaged proximal right humeral fixation hardware. IMPRESSION: 1. Decreased lung volumes with increased bilateral bronchovascular crowding and bibasilar opacities, which may represent atelectasis, aspiration, or pneumonia. 2.  Enlarged cardiomediastinal silhouette is likely projectional. Electronically Signed   By: Darrin Nipper M.D.   On: 01/14/2023 08:20   US Venous Img Lower Unilateral Left (DVT)  Result Date: 01/11/2023 CLINICAL DATA:  57 year old male with pain EXAM: LEFT LOWER EXTREMITY VENOUS DOPPLER ULTRASOUND TECHNIQUE: Gray-scale sonography with graded compression, as well as color Doppler and duplex ultrasound were performed to evaluate the lower extremity deep venous systems from the level of the common femoral vein and including the common femoral, femoral, profunda femoral, popliteal and calf veins including the posterior tibial, peroneal and gastrocnemius veins when visible. The superficial great saphenous vein was also interrogated. Spectral Doppler was utilized to evaluate flow at rest and with distal augmentation maneuvers in the common femoral, femoral and popliteal veins. COMPARISON:  None Available. FINDINGS: Contralateral Common Femoral Vein: Respiratory phasicity is normal and symmetric with the symptomatic side. No evidence of thrombus. Normal compressibility. Common Femoral Vein: No evidence of thrombus. Normal compressibility, respiratory phasicity and response to augmentation. Saphenofemoral Junction: No evidence of thrombus. Normal compressibility and flow on color Doppler imaging. Profunda Femoral Vein: No evidence of thrombus. Normal compressibility and flow on color Doppler imaging.  Femoral Vein: No evidence of thrombus. Normal compressibility, respiratory phasicity and response to augmentation. Popliteal Vein: No evidence of thrombus. Normal compressibility, respiratory phasicity and response to augmentation. Calf Veins: No evidence of thrombus. Normal compressibility and flow on color Doppler imaging. Superficial Great  Saphenous Vein: No evidence of thrombus. Normal compressibility and flow on color Doppler imaging. Other Findings:  None. IMPRESSION: Directed duplex of the left lower extremity negative for DVT Signed, Dulcy Fanny. Nadene Rubins, RPVI [facility] Electronically Signed   By: Corrie Mckusick D.O.   On: 01/11/2023 09:39   DG Chest 2 View  Result Date: 01/10/2023 CLINICAL DATA:  Sepsis, calf pain EXAM: CHEST - 2 VIEW COMPARISON:  09/04/2022 FINDINGS: Chronic pleural thickening and confluent linear densities at the right lung base extending up into the right mid lung, not substantially changed from 09/04/2022, presumably primarily from scarring. Plate and screw fixator of the right humerus. Speckled metal densities/metal fragments along the right anterior chest, as before. Low lung volumes are present, causing crowding of the pulmonary vasculature. Upper normal heart size. The left lung appears clear. Exaggerated upward convex lobulation of both hemidiaphragms. IMPRESSION: 1. Chronic scarring at the right lung base extending up into the right mid lung, not substantially changed from 09/04/2022. 2. Low lung volumes. 3. Upper normal heart size. 4. Exaggerated upward convex lobulation of both hemidiaphragms. Electronically Signed   By: Van Clines M.D.   On: 01/10/2023 19:02   DG Tibia/Fibula Left  Result Date: 01/10/2023 CLINICAL DATA:  Calf pain, suspected sepsis. EXAM: LEFT TIBIA AND FIBULA - 2 VIEW COMPARISON:  None Available. FINDINGS: There is no evidence of fracture or other focal bone lesions. Extensive  peripheral vascular calcifications are present. The soft tissues are otherwise within normal limits. IMPRESSION: Negative. Electronically Signed   By: Ronney Asters M.D.   On: 01/10/2023 19:00     Discharge Exam: Vitals:   01/25/23 1000 01/25/23 1109  BP: (!) 78/59   Pulse: (!) 112   Resp: (!) 22   Temp:  98.2 F (36.8 C)  SpO2: 92%    Vitals:   01/25/23 0800 01/25/23 0911 01/25/23 1000 01/25/23 1109  BP: (!) 96/54 (!) 87/37 (!) 78/59   Pulse: (!) 114 95 (!) 112   Resp: 18 (!) 38 (!) 22   Temp:    98.2 F (36.8 C)  TempSrc:    Oral  SpO2: 96% 94% 92%   Weight:      Height:        General: Adult male resting in bed.  No acute distress.  Pleasant and appropriate. HEENT: PERRLA.  MMM. Lungs: CTAB.  Normal work of breathing on room air. Cardiovascular: Borderline tachycardic, regular rhythm with occasional irregular beat.  Normal S1/S2 no murmurs/rubs/gallops. Abdomen: Soft, nondistended.  No tenderness to palpation. Extremities: Ace wrap and dressing in place on left lower extremity.  Tenderness and hard lump palpable behind L knee, below popliteal fossa. Skin: Left lower extremity arterial ulcer not examined, protected by wrap and dressing.  No erythema or drainage surrounding wound. Neuro: Alert and oriented x 4.  No focal neurological deficit.   The results of significant diagnostics from this hospitalization (including imaging, microbiology, ancillary and laboratory) are listed below for reference.    Microbiology: Recent Results (from the past 240 hour(s))  Culture, blood (Routine X 2) w Reflex to ID Panel     Status: None (Preliminary result)   Collection Time: 01/20/23  3:33 PM   Specimen: BLOOD LEFT HAND  Result Value Ref Range Status   Specimen Description BLOOD LEFT HAND  Final   Special Requests   Final    BOTTLES DRAWN AEROBIC AND ANAEROBIC Blood Culture results may not be optimal due to  an excessive volume of blood received in culture bottles   Culture   Final     NO GROWTH 4 DAYS Performed at [facility], [address], [place], [place] [address]    Report Status PENDING  Incomplete  Culture, blood (Routine X 2) w Reflex to ID Panel     Status: None (Preliminary result)   Collection Time: 01/20/23  3:54 PM   Specimen: BLOOD RIGHT HAND  Result Value Ref Range Status   Specimen Description BLOOD RIGHT HAND  Final   Special Requests   Final    BOTTLES DRAWN AEROBIC AND ANAEROBIC Blood Culture results may not be optimal due to an excessive volume of blood received in culture bottles   Culture   Final    NO GROWTH 4 DAYS Performed at [facility], [address] [place], [place] [address]    Report Status PENDING  Incomplete     Labs: BNP (last 3 results) No results for input(s): "BNP" in the last 8760 hours. Basic Metabolic Panel: Recent Labs  Lab 01/21/23 0452 01/22/23 0428 01/23/23 0408 01/24/23 0301 01/25/23 0425  NA 135 134* 132* 132* 135  K 3.3* 3.2* 3.6 3.6 3.8  CL 94* 94* 92* 94* 94*  CO2 '25 28 25 25 29  '$ GLUCOSE 83 93 89 92 86  BUN 23* 13 19 26* 18  CREATININE 8.32* 5.78* 7.38* 9.25* 7.11*  CALCIUM 7.5* 7.7* 7.7* 7.7* 7.9*  MG 1.7 1.6* 2.0 2.1 1.9  PHOS 2.6 1.8* 1.7* 2.0* 1.7*   Liver Function Tests: Recent Labs  Lab 01/21/23 0452 01/22/23 0428 01/23/23 0408 01/24/23 0301 01/25/23 0425  ALBUMIN 2.4* 2.7* 2.7* 2.6* 2.8*   No results for input(s): "LIPASE", "AMYLASE" in the last 168 hours. No results for input(s): "AMMONIA" in the last 168 hours. CBC: Recent Labs  Lab 01/19/23 0418 01/20/23 0344 01/21/23 0452 01/22/23 0428 01/23/23 0408 01/24/23 0301 01/25/23 0425  WBC 15.3*   < > 11.2* 9.9 10.2 10.8* 10.7*  NEUTROABS 11.2*  --   --   --   --   --   --   HGB 10.9*   < > 9.8* 10.1* 10.0* 10.2* 9.6*  HCT 33.9*   < > 30.6* 31.4* 31.1* 31.8* 31.4*  MCV 89.9   < > 88.7 87.0 87.9 89.3 91.5  PLT 244   < > 253 291 311 356 378   < > = values in this interval not displayed.   Cardiac Enzymes: Recent Labs   Lab 01/21/23 0452  CKTOTAL 47*   BNP: Invalid input(s): "POCBNP" CBG: No results for input(s): "GLUCAP" in the last 168 hours. D-Dimer No results for input(s): "DDIMER" in the last 72 hours. Hgb A1c No results for input(s): "HGBA1C" in the last 72 hours. Lipid Profile No results for input(s): "CHOL", "HDL", "LDLCALC", "TRIG", "CHOLHDL", "LDLDIRECT" in the last 72 hours. Thyroid function studies No results for input(s): "TSH", "T4TOTAL", "T3FREE", "THYROIDAB" in the last 72 hours.  Invalid input(s): "FREET3" Anemia work up No results for input(s): "VITAMINB12", "FOLATE", "FERRITIN", "TIBC", "IRON", "RETICCTPCT" in the last 72 hours. Urinalysis    Component Value Date/Time   COLORURINE YELLOW 11/15/2016 0330   APPEARANCEUR CLEAR 11/15/2016 0330   LABSPEC 1.015 11/15/2016 0330   PHURINE 8.0 11/15/2016 0330   GLUCOSEU NEGATIVE 11/15/2016 0330   HGBUR SMALL (A) 11/15/2016 0330   BILIRUBINUR NEGATIVE 11/15/2016 0330   KETONESUR NEGATIVE 11/15/2016 0330   PROTEINUR 30 (A) 11/15/2016 0330   NITRITE NEGATIVE 11/15/2016 0330  LEUKOCYTESUR NEGATIVE 11/15/2016 0330   Sepsis Labs Recent Labs  Lab 01/22/23 0428 01/23/23 0408 01/24/23 0301 01/25/23 0425  WBC 9.9 10.2 10.8* 10.7*   Microbiology Recent Results (from the past 240 hour(s))  Culture, blood (Routine X 2) w Reflex to ID Panel     Status: None (Preliminary result)   Collection Time: 01/20/23  3:33 PM   Specimen: BLOOD LEFT HAND  Result Value Ref Range Status   Specimen Description BLOOD LEFT HAND  Final   Special Requests   Final    BOTTLES DRAWN AEROBIC AND ANAEROBIC Blood Culture results may not be optimal due to an excessive volume of blood received in culture bottles   Culture   Final    NO GROWTH 4 DAYS Performed at [facility], [address], [place], [place] [address]    Report Status PENDING  Incomplete  Culture, blood (Routine X 2) w Reflex to ID Panel     Status: None (Preliminary result)    Collection Time: 01/20/23  3:54 PM   Specimen: BLOOD RIGHT HAND  Result Value Ref Range Status   Specimen Description BLOOD RIGHT HAND  Final   Special Requests   Final    BOTTLES DRAWN AEROBIC AND ANAEROBIC Blood Culture results may not be optimal due to an excessive volume of blood received in culture bottles   Culture   Final    NO GROWTH 4 DAYS Performed at [facility], [address], [place],  [address]    Report Status PENDING  Incomplete     Time coordinating discharge: 35 minutes  SIGNED:   Ashtin Rosner Katy Fitch, MS4 [facility] 01/25/2023, 11:36 AM  If 7PM-7AM, please contact night-coverage [email or web address]

## 2023-01-25 NOTE — Progress Notes (Signed)
Nsg Discharge Note  Admit Date:  01/10/2023 Discharge date: 01/25/2023   [name] to be D/C'd Home per MD order.  AVS completed.  Copy for chart, and copy for patient signed, and dated. Patient/caregiver able to verbalize understanding.  Discharge Medication: Allergies as of 01/25/2023       Reactions   Vancomycin Swelling, Hives, Itching, Other (See Comments), Rash   Lip swelling Lip swelling, Reaction Type: Allergy; Reaction(s): swelling of lips Reaction Type: Allergy; Reaction(s): swelling of lips        Medication List     STOP taking these medications    amoxicillin 500 MG capsule Commonly known as: AMOXIL   metoprolol tartrate 25 MG tablet Commonly known as: LOPRESSOR   predniSONE 10 MG tablet Commonly known as: DELTASONE       TAKE these medications    acetaminophen 500 MG tablet Commonly known as: TYLENOL Take 1,000 mg by mouth every 6 (six) hours as needed (pain.).   allopurinol 100 MG tablet Commonly known as: ZYLOPRIM Take 100 mg by mouth in the morning.   aspirin EC 81 MG tablet Take 81 mg by mouth in the morning.   atorvastatin 80 MG tablet Commonly known as: LIPITOR Take 80 mg by mouth at bedtime.   calcium carbonate 750 MG chewable tablet Commonly known as: TUMS EX Chew 2 tablets by mouth at bedtime.   cyclobenzaprine 10 MG tablet Commonly known as: FLEXERIL Take 10 mg by mouth daily as needed for muscle spasms.   famotidine 20 MG tablet Commonly known as: PEPCID Take 20 mg by mouth at bedtime.   midodrine 5 MG tablet Commonly known as: PROAMATINE Take 3 tablets (15 mg total) by mouth 3 (three) times daily with meals. What changed:  how much to take when to take this additional instructions   multivitamin with minerals Tabs tablet Take 1 tablet by mouth in the morning.   pantoprazole 40 MG tablet Commonly known as: PROTONIX Take 1 tablet (40 mg total) by mouth 2 (two) times daily. What changed: when to take this    tamsulosin 0.4 MG Caps capsule Commonly known as: FLOMAX Take 0.4 mg by mouth every evening.   VITAMIN C PO Take 500 mg by mouth in the morning.   Vitamin D 50 MCG (2000 UT) tablet Take 2,000 Units by mouth in the morning.               Discharge Care Instructions  (From admission, onward)           Start     Ordered   01/25/23 0000  Discharge wound care:       Comments: Clean L anterior ankle wound with NS, apply Silver Hydrofiber Kellie Simmering [phone]) cut to fit wound bed daily. Cover with dry gauze and  secure with Kerlix and 4" Ace wrap Kellie Simmering [phone]).  May need to moisten silver with NS if stuck to wound bed at dressing changes.  01/14/23 0859     01/12/23 0500    Wound care  Daily      Comments: Apply Aquacel to left leg and cover with ABB pad and kerlex Q day.  Moisten Aquacel with NS each time to assist with removal.  ** if family prefers to bring in a silver collagen dressing from home, use this instead.  We do not carry them in the [facility].   01/25/23 1136            Discharge Assessment: Vitals:  01/25/23 1200 01/25/23 1400  BP: (!) 82/56 (!) 85/70  Pulse: 91 98  Resp: (!) 24 (!) 22  Temp:    SpO2: 90% (!) 89%   Skin clean, dry and intact without evidence of skin break down, no evidence of skin tears noted. IV catheter discontinued intact. Site without signs and symptoms of complications - no redness or edema noted at insertion site, patient denies c/o pain - only slight tenderness at site.  Dressing with slight pressure applied.  D/c Instructions-Education: Discharge instructions given to patient/family with verbalized understanding. D/c education completed with patient/family including follow up instructions, medication list, d/c activities limitations if indicated, with other d/c instructions as indicated by MD - patient able to verbalize understanding, all questions fully answered. Patient instructed to return to ED, call 911, or  call MD for any changes in condition.  Patient escorted via [place], and D/C home via private auto.  Carney Corners, RN 01/25/2023 2:23 PM

## 2023-01-25 NOTE — Progress Notes (Signed)
[name], male   DOB: 08-19-1966, 57 y.o.   MRN: [id number] S: Pt feels well today and tolerated HD yesterday with 2 liters uf. O:BP (!) 96/54   Pulse (!) 114   Temp 98.1 F (36.7 C) (Oral)   Resp 18   Ht '6\' 1"'$  (1.854 m)   Wt 100.6 kg   SpO2 96%   BMI 29.26 kg/m   Intake/Output Summary (Last 24 hours) at 01/25/2023 0908 Last data filed at 01/24/2023 1425 Gross per 24 hour  Intake --  Output 2100 ml  Net -2100 ml   Intake/Output: I/O last 3 completed shifts: In: -  Out: 2100 [Other:2100]  Intake/Output this shift:  No intake/output data recorded. Weight change:  Gen: NAD CVS: tachy at 114 Resp:cta Abd: +BS, soft, NT/ND Ext: no edema, LAVF +T/B  Recent Labs  Lab 01/19/23 0418 01/20/23 0344 01/21/23 0452 01/22/23 0428 01/23/23 0408 01/24/23 0301 01/25/23 0425  NA 133* 134* 135 134* 132* 132* 135  K 3.4* 3.1* 3.3* 3.2* 3.6 3.6 3.8  CL 91* 92* 94* 94* 92* 94* 94*  CO2 '25 27 25 28 25 25 29  '$ GLUCOSE 89 92 83 93 89 92 86  BUN 23* 16 23* 13 19 26* 18  CREATININE 8.14* 6.25* 8.32* 5.78* 7.38* 9.25* 7.11*  ALBUMIN 2.4* 2.6* 2.4* 2.7* 2.7* 2.6* 2.8*  CALCIUM 7.5* 7.5* 7.5* 7.7* 7.7* 7.7* 7.9*  PHOS 2.5 1.9* 2.6 1.8* 1.7* 2.0* 1.7*   Liver Function Tests: Recent Labs  Lab 01/23/23 0408 01/24/23 0301 01/25/23 0425  ALBUMIN 2.7* 2.6* 2.8*   No results for input(s): "LIPASE", "AMYLASE" in the last 168 hours. No results for input(s): "AMMONIA" in the last 168 hours. CBC: Recent Labs  Lab 01/19/23 0418 01/20/23 0344 01/21/23 0452 01/22/23 0428 01/23/23 0408 01/24/23 0301 01/25/23 0425  WBC 15.3*   < > 11.2* 9.9 10.2 10.8* 10.7*  NEUTROABS 11.2*  --   --   --   --   --   --   HGB 10.9*   < > 9.8* 10.1* 10.0* 10.2* 9.6*  HCT 33.9*   < > 30.6* 31.4* 31.1* 31.8* 31.4*  MCV 89.9   < > 88.7 87.0 87.9 89.3 91.5  PLT 244   < > 253 291 311 356 378   < > = values in this interval not displayed.   Cardiac Enzymes: Recent Labs  Lab 01/21/23 0452   CKTOTAL 47*   CBG: No results for input(s): "GLUCAP" in the last 168 hours.  Iron Studies: No results for input(s): "IRON", "TIBC", "TRANSFERRIN", "FERRITIN" in the last 72 hours. Studies/Results: No results found.  aspirin EC  81 mg Oral q AM   atorvastatin  80 mg Oral QHS   calcium carbonate  1,500 mg Oral QHS   Chlorhexidine Gluconate Cloth  6 each Topical Q0600   Chlorhexidine Gluconate Cloth  6 each Topical Q0600   darbepoetin (ARANESP) injection - NON-DIALYSIS  100 mcg Subcutaneous Q Fri-1800   famotidine  20 mg Oral QHS   heparin  5,000 Units Subcutaneous Q8H   midodrine  15 mg Oral TID WC   pantoprazole  40 mg Oral Q breakfast   saccharomyces boulardii  250 mg Oral BID   tamsulosin  0.4 mg Oral QPM    BMET    Component Value Date/Time   NA 135 01/25/2023 0425   K 3.8 01/25/2023 0425   CL 94 (L) 01/25/2023 0425   CO2 29 01/25/2023 0425   GLUCOSE  86 01/25/2023 0425   BUN 18 01/25/2023 0425   CREATININE 7.11 (H) 01/25/2023 0425   CALCIUM 7.9 (L) 01/25/2023 0425   GFRNONAA 8 (L) 01/25/2023 0425   GFRAA 31 (L) 11/25/2016 0549   CBC    Component Value Date/Time   WBC 10.7 (H) 01/25/2023 0425   RBC 3.43 (L) 01/25/2023 0425   HGB 9.6 (L) 01/25/2023 0425   HCT 31.4 (L) 01/25/2023 0425   PLT 378 01/25/2023 0425   MCV 91.5 01/25/2023 0425   MCH 28.0 01/25/2023 0425   MCHC 30.6 01/25/2023 0425   RDW 21.2 (H) 01/25/2023 0425   LYMPHSABS 1.1 01/19/2023 0418   MONOABS 1.6 (H) 01/19/2023 0418   EOSABS 1.2 (H) 01/19/2023 0418   BASOSABS 0.1 01/19/2023 0418    HD orders at [facility] MWF 3 hours 45 min using AVF- 15 gauge needles 400 BFR/500 DFR- 2/2.5 bath, temp 36.5- gets heparin 1000 load, then 200 per hour, also on venofer 50 per week EDW 94.5    Assessment/Plan:   FUO/Sepsis - unclear source, cultures negative.  TEE negative for culture negative SBE on 01/20/23.  Afebrile today and repeat blood cultures sent yesterday.  CT of left leg without evidence of  drainable abscess.  Antibiotics on hold for now. ESRD - continue with HD on MWF schedule.  Had some issues with PSVT during HD on 01/21/23 but tolerated it well on 01/24/23.   HTN/volume - pt hypotensive and UF limited due to tachycardia and hypotension.  Continue high dose midodrine with HD.  weights vary daily and likely not accurate.  May need new, higher edw. Anemia of ESRD - hgb stable, given ESA and IV iron. 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 PSVT- cardiology following and on po amiodarone.  Per Dr. Harl Bowie. P atrial fibrillation - s/p watchman device due to h/o GIB. On po amio as above. Hypokalemia - will use 4K bath with HD today and follow.   Disposition - per pt plan for discharge to home today +/[facility] pending PT evaluation.  Will f/u with his outpatient HD unit tomorrow.  Donetta Potts, MD [facility]

## 2023-01-25 NOTE — Progress Notes (Signed)
Patient slept well at night, uses CPAP HS, no complains of pain and discomfort, gave PRN pain medicine at the start of shift, wound cleaned, looks clean, red and scant serosanguinous drain present. patient walked down to use the [facility], had a good bowel movement and is independent with set up, standby assist, blood pressure has been soft but the patient has been asymptomatic, patient is okay to be downgraded, safety precautions applied, bed at its lowest position and callbell on the side. Will continue to monitor and will endorse to the oncoming nurse.

## 2023-01-25 NOTE — Care Management Important Message (Signed)
Important Message  Patient Details  Name: [name] MRN: [id number] Date of Birth: [birth year]   Medicare Important Message Given:  Yes (copy provided)     Tommy Medal 01/25/2023, 11:49 AM

## 2023-01-25 NOTE — Evaluation (Signed)
Physical Therapy Evaluation Patient Details Name: [name] MRN: [id number] DOB: 11/26/1965 Today's Date: 01/25/2023  History of Present Illness  Ketih Cordill is a 57 y.o. male with medical history significant of atrial fibrillation, CHF, ESRD on dialysis Monday Wednesday Friday, DVT of left lower extremity, GERD, hyperlipidemia, hypertension, and more presents the ED with a chief complaint of fever.  Patient reports that he had a fever that started 3 days ago.  It seemed to be resolving 2 days ago, and he felt good yesterday.  Fever returned again today.  He reports a Tmax at home of 102.4.  Patient arrived with a temperature of 102.  Patient reports that his only symptom out of the norm for him is been left lower extremity soreness.  He was worried he had a DVT there because it feels the same as when he did have a DVT in October.  Patient reports that he finished 90 days of Eliquis back in October, but has not continued to be on anticoagulation.  For A-fib, patient has Watchman device.  Patient describes this pain is a soreness in the back of his calf.  He does have a ulcer lower on that extremity.  He has been treating that per wound care and is due for his last skin graft coming up.  Patient reports that it has not been draining anything.  He reports the wound is as clean as can be.  Patient denies any cough, nausea, vomiting, diarrhea.  Patient does not make urine.  He reports that his stomach has been "gurgly" lately, and that his wife thought he had a GI virus.  He reports that everybody he knew with a GI virus was having nausea and vomiting, and he was not having any of the symptoms.  He has had a decreased appetite.  His last normal meal was about 4 days ago.  He is only snack since then.  Patient did go to dialysis today.  He has no other complaints at this time.   Clinical Impression  Patient functioning near baseline for functional mobility and gait demonstrating good return for donning  his walking boot and shoe, transferring to/from [facility] and able to walk in hallway without loss of balance with no need for an AD.  PLAN:  Patient to be discharged home today and discharged from acute physical therapy to care of nursing for ambulation as tolerated for length of stay with recommendations stated below         Recommendations for follow up therapy are one component of a multi-disciplinary discharge planning process, led by the attending physician.  Recommendations may be updated based on patient status, additional functional criteria and insurance authorization.  Follow Up Recommendations Home health PT      Assistance Recommended at Discharge Set up Supervision/Assistance  Patient can return home with the following  A little help with walking and/or transfers;Help with stairs or ramp for entrance;A little help with bathing/dressing/bathroom;Assistance with cooking/housework    Equipment Recommendations None recommended by PT  Recommendations for Other Services       Functional Status Assessment Patient has had a recent decline in their functional status and demonstrates the ability to make significant improvements in function in a reasonable and predictable amount of time.     Precautions / Restrictions Precautions Precautions: None Restrictions Weight Bearing Restrictions: No Other Position/Activity Restrictions: walking boot for LLE      Mobility  Bed Mobility Overal bed mobility: Independent  Transfers Overall transfer level: Modified independent                      Ambulation/Gait Ambulation/Gait assistance: Modified independent (Device/Increase time) Gait Distance (Feet): 100 Feet Assistive device: None Gait Pattern/deviations: Decreased step length - right, Decreased step length - left, Decreased stride length, Antalgic, Decreased dorsiflexion - left Gait velocity: decreased     General Gait Details: Patient  demonstrates good return for ambulating in room/hallway without loss of balance  Stairs            Wheelchair Mobility    Modified Rankin (Stroke Patients Only)       Balance Overall balance assessment: Needs assistance Sitting-balance support: Feet supported, No upper extremity supported Sitting balance-Leahy Scale: Good Sitting balance - Comments: seated at EOB   Standing balance support: During functional activity, No upper extremity supported Standing balance-Leahy Scale: Fair Standing balance comment: fair/good without use of an AD                             Pertinent Vitals/Pain Pain Assessment Pain Assessment: No/denies pain    Home Living Family/patient expects to be discharged to:: Private residence Living Arrangements: Spouse/significant other;Children Available Help at Discharge: Family;Available PRN/intermittently Type of Home: House Home Access: Stairs to enter   Entrance Stairs-Number of Steps: 1 Alternate Level Stairs-Number of Steps: 12 steps to basement Home Layout: Two level;Laundry or work area in [place]: Conservation officer, nature (2 wheels);Cane - single point;Shower seat      Prior Function Prior Level of Function : Independent/Modified Independent;Driving             Mobility Comments: Hydrographic surveyor, drives, works ADLs Comments: Independent     Hand Dominance        Extremity/Trunk Assessment   Upper Extremity Assessment Upper Extremity Assessment: Overall WFL for tasks assessed    Lower Extremity Assessment Lower Extremity Assessment: Overall WFL for tasks assessed    Cervical / Trunk Assessment Cervical / Trunk Assessment: Normal  Communication   Communication: No difficulties  Cognition Arousal/Alertness: Awake/alert Behavior During Therapy: WFL for tasks assessed/performed Overall Cognitive Status: Within Functional Limits for tasks assessed                                           General Comments      Exercises     Assessment/Plan    PT Assessment All further PT needs can be met in the next venue of care  PT Problem List Decreased strength;Decreased activity tolerance;Decreased balance;Decreased mobility       PT Treatment Interventions      PT Goals (Current goals can be found in the Care Plan section)  Acute Rehab PT Goals Patient Stated Goal: return home with family to assist PT Goal Formulation: With patient Time For Goal Achievement: 01/25/23 Potential to Achieve Goals: Good    Frequency       Co-evaluation               AM-PAC PT "6 Clicks" Mobility  Outcome Measure Help needed turning from your back to your side while in a flat bed without using bedrails?: None Help needed moving from lying on your back to sitting on the side of a flat bed without using bedrails?: None Help needed moving to and from a  bed to a chair (including a wheelchair)?: None Help needed standing up from a chair using your arms (e.g., wheelchair or bedside chair)?: None Help needed to walk in hospital room?: A Little Help needed climbing 3-5 steps with a railing? : A Little 6 Click Score: 22    End of Session   Activity Tolerance: Patient tolerated treatment well;Patient limited by fatigue Patient left: in chair Nurse Communication: Mobility status PT Visit Diagnosis: Unsteadiness on feet (R26.81);Other abnormalities of gait and mobility (R26.89);Muscle weakness (generalized) (M62.81)    Time: [id number] PT Time Calculation (min) (ACUTE ONLY): 26 min   Charges:   PT Evaluation $PT Eval Moderate Complexity: 1 Mod PT Treatments $Therapeutic Activity: 23-37 mins        12:16 PM, 01/25/23 Lonell Grandchild, MPT Physical Therapist with [facility] 336 [phone] office [phone] mobile phone

## 2023-01-26 LAB — CULTURE, BLOOD (ROUTINE X 2)
Culture: NO GROWTH
Culture: NO GROWTH

## 2023-02-01 NOTE — Addendum Note (Signed)
Addendum  created 02/01/23 1009 by Karna Dupes, CRNA   Intraprocedure Staff edited

## 2023-02-02 ENCOUNTER — Encounter (HOSPITAL_COMMUNITY): Payer: Self-pay | Admitting: Cardiology

## 2023-02-03 ENCOUNTER — Ambulatory Visit (INDEPENDENT_AMBULATORY_CARE_PROVIDER_SITE_OTHER): Payer: Medicare Other | Admitting: Physician Assistant

## 2023-02-03 ENCOUNTER — Encounter: Payer: Self-pay | Admitting: Physician Assistant

## 2023-02-03 VITALS — BP 81/57 | HR 94

## 2023-02-03 DIAGNOSIS — S91002D Unspecified open wound, left ankle, subsequent encounter: Secondary | ICD-10-CM | POA: Diagnosis not present

## 2023-02-03 DIAGNOSIS — S86902D Unspecified injury of unspecified muscle(s) and tendon(s) at lower leg level, left leg, subsequent encounter: Secondary | ICD-10-CM | POA: Diagnosis not present

## 2023-02-03 DIAGNOSIS — S81802D Unspecified open wound, left lower leg, subsequent encounter: Secondary | ICD-10-CM

## 2023-02-03 DIAGNOSIS — S96902D Unspecified injury of unspecified muscle and tendon at ankle and foot level, left foot, subsequent encounter: Secondary | ICD-10-CM

## 2023-02-03 DIAGNOSIS — S81002D Unspecified open wound, left knee, subsequent encounter: Secondary | ICD-10-CM

## 2023-02-03 NOTE — Progress Notes (Signed)
[place] [id number] [place] [address]   CC:  Chief Complaint  Patient presents with   Post-op Follow-up      [name] is an 57 y.o. male.  HPI: Patient is a 57 year old male with PMH of DVT with subsequent wound over left anterior ankle with exposed tendon s/p debridement and placement of ACell followed by wound VAC performed 11/30/2022 by Dr. Lovena Le who presents to clinic for follow-up.   Patient was last seen in clinic on 01/06/2023.  At that time, decision was made to proceed with STSG to expedite recovery.  At that time, wound was measuring 8 x 6.25 cm.  Wound VAC was ordered.  However, shortly before the scheduled surgery he was admitted to the hospital for bacteremia and atrial fibrillation with RVR.  He was discharged home from the hospital on 01/25/2023 after an extended admission.  He will need cardiology clearance from his cardiologist in [place] prior to rescheduling STSG.  Today, patient is doing well better since his discharge from the hospital.  He states he did not take his midodrine dose this morning which is why his BP is soft here in clinic.  He denies any systemic symptoms.  He states the wound has continued to make good progress and he is pleased.  He continues to clean it regularly before applying collagen, 4 x 4 gauze, and secured with Kerlix.  He is still interested in moving forward with STSG to expedite his recovery so that he can be placed on the renal transplant list.   Allergies  Allergen Reactions   Vancomycin Swelling, Hives, Itching, Other (See Comments) and Rash    Lip swelling  Lip swelling, Reaction Type: Allergy; Reaction(s): swelling of lips  Reaction Type: Allergy; Reaction(s): swelling of lips    Outpatient Encounter Medications as of 02/03/2023  Medication Sig   acetaminophen (TYLENOL) 500 MG tablet Take 1,000 mg by mouth every 6 (six) hours as needed (pain.).   allopurinol (ZYLOPRIM) 100 MG tablet Take 100  mg by mouth in the morning.   Ascorbic Acid (VITAMIN C PO) Take 500 mg by mouth in the morning.   aspirin EC 81 MG tablet Take 81 mg by mouth in the morning.   atorvastatin (LIPITOR) 80 MG tablet Take 80 mg by mouth at bedtime.   calcium carbonate (TUMS EX) 750 MG chewable tablet Chew 2 tablets by mouth at bedtime.   Cholecalciferol (VITAMIN D) 50 MCG (2000 UT) tablet Take 2,000 Units by mouth in the morning.   cyclobenzaprine (FLEXERIL) 10 MG tablet Take 10 mg by mouth daily as needed for muscle spasms.   famotidine (PEPCID) 20 MG tablet Take 20 mg by mouth at bedtime.   midodrine (PROAMATINE) 5 MG tablet Take 3 tablets (15 mg total) by mouth 3 (three) times daily with meals. (Patient taking differently: Take 15-20 mg by mouth See admin instructions. Take 4 tablets (20 mg) by mouth in the morning before dialysis on Mondays, Wednesday & Fridays and Take 3 tablets (15 mg) by mouth during dialysis sessions on Mondays, Wednesdays & Fridays.)   Multiple Vitamin (MULTIVITAMIN WITH MINERALS) TABS tablet Take 1 tablet by mouth in the morning.   pantoprazole (PROTONIX) 40 MG tablet Take 1 tablet (40 mg total) by mouth 2 (two) times daily. (Patient taking differently: Take 40 mg by mouth daily before breakfast.)   tamsulosin (FLOMAX) 0.4 MG CAPS capsule Take 0.4 mg by mouth every evening.   No facility-administered  encounter medications on file as of 02/03/2023.     Past Medical History:  Diagnosis Date   Chronic heart failure with preserved ejection fraction (HFpEF) ([place])    a. 03/2017 Echo: EF 55-65%, dil RV, RVSP 60mmHg, ml RV fxn, mild TR; b. 12/2019 Echo: EF 60-65%, mildly reduced RV fxn, mild BAE, No siginif valvular dzs; c. 12/2021 RHC ([name]): RA 9, RV 57/14, PCWP 16, PA 50/25 (33). CO/CI ([facility]) 4.44/2.0.   Coronary artery disease    a. 10/2020 MV: prominently fixed apical defect w/ mod inflat ischemia. Inlat HK. Nl RV fxn; b. 03/2021 PCI LAD.   DVT (deep venous thrombosis) ([place]) 08/30/2022   a. L  Leg-->completed 3 mos eliquis.   ESRD (end stage renal disease) ([place])    a.  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 s/p PVI 2016; b. CHA2DS2VASc = 3--> was on warfarin and then eliquis-->08/2021 s/p Watchman device following GIB 03/2021.   Pneumonia    Pulmonary hypertension ([place])    Sleep apnea    uses bipap nightly    Past Surgical History:  Procedure Laterality Date   ABLATION     APPLICATION OF WOUND VAC Left 11/30/2022   Procedure: APPLICATION OF WOUND VAC;  Surgeon: [name], MD;  Location: [place];  Service: Plastics;  Laterality: Left;   CHOLECYSTECTOMY     COLONOSCOPY WITH PROPOFOL N/A 12/09/2020   non-bleeding internal hemorrhoids, sigmoid and descending colon diverticulosis, stool in entire examined colon.    ESOPHAGOGASTRODUODENOSCOPY (EGD) WITH PROPOFOL N/A 03/31/2021   active bleeding in duodenum due to suspected AVM s/p hemostatic spray   GIVENS CAPSULE STUDY N/A 06/04/2021   Procedure: GIVENS CAPSULE STUDY;  Surgeon: Eloise Harman, DO;  [facility];  Service: Endoscopy;  Laterality: N/A;  7:30am   HEMOSTASIS CONTROL  03/31/2021   Procedure: HEMOSTASIS CONTROL;  Surgeon: Thornton Park, MD;  Location: [place];  Service: Gastroenterology;;   HERNIA MESH REMOVAL     abdominal   INCISION AND DRAINAGE OF WOUND Left 10/21/2022   Procedure: IRRIGATION AND DEBRIDEMENT WOUND left leg wound with placement of myriad;  Surgeon: [name], MD;  Location: [place];  Service: Plastics;  Laterality: Left;   INCISION AND DRAINAGE OF WOUND Left 11/30/2022   Procedure: debridement and preparation of wound, left leg;  Surgeon: [name], MD;  Location: [place];  Service: Plastics;  Laterality: Left;   LEFT ATRIAL APPENDAGE  OCCLUSION  09/10/2021   SMALL BOWEL ENTEROSCOPY     Normal stomach, normal duodenum, 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   TEE WITHOUT CARDIOVERSION N/A 01/20/2023   Procedure: TRANSESOPHAGEAL ECHOCARDIOGRAM (TEE);  Surgeon: Arnoldo Lenis, MD;  Location: [facility];  Service: Endoscopy;  Laterality: N/A;   TRANSPLANTATION RENAL  2005    Family History  Problem Relation Age of Onset   Hypertension Mother    Heart attack Father    Hypertension Maternal Grandmother    Stroke Maternal Grandfather    Heart attack Paternal Uncle    Heart attack Paternal Uncle    Heart attack Paternal Aunt    Stroke Maternal Uncle     Social History   Social History Narrative   Not on file  Review of Systems General: Denies fevers or chills Skin: Denies drainage or erythema  Physical Exam    02/03/2023   10:19 AM 02/03/2023   10:18 AM 01/25/2023    2:00 PM  Vitals with BMI  Systolic 81 73 85  Diastolic 57 46 70  Pulse 94 98 98    General:  No acute distress, nontoxic appearing  Respiratory: No increased work of breathing Neuro: Alert and oriented Psychiatric: Normal mood and affect  Skin: Wound has shrunk, now measuring 7 x 5 cm.  Complete coverage of the tendon.  Excellent granular tissue throughout.  Area of hypertrophy over superior aspect of wound.  No malodor, surrounding erythema, induration, or other evidence concerning for infection.  Assessment/Plan  Chronic left lower extremity wound: Discussed case with Dr. Lovena Le personally evaluated patient at bedside.  Will plan to proceed with STSG and attempt to expedite recovery.  Discussed risks and benefits with patient and he would like to proceed.  Discussed that we will use a wound VAC at time of STSG in efforts to optimize outcome.   Continue with collagen dressing changes in interim.  Contacting surgical scheduler to find time on Dr. Tanna Furry OR schedule.  Picture(s) obtained of the patient and placed in the chart were with the  patient's or guardian's permission.  Told patient that he would need to have received cardiac clearance from his cardiologist in [place].  He states that he will call them to make an appointment.  Krista Blue 02/03/2023, 3:11 PM

## 2023-02-22 ENCOUNTER — Telehealth: Payer: Self-pay | Admitting: *Deleted

## 2023-02-22 NOTE — Telephone Encounter (Signed)
Request for surgical clearance faxed to Darrick Meigs with [facility] [address] [place], [place]. Fax confirmation received

## 2023-02-24 NOTE — Telephone Encounter (Signed)
Surgical clearance order received from Darrick Meigs, NP [facility]). Copy given to [place], [place]. Request for office note faxed with confirmation received to [phone]. Note not available in CareEverywhere

## 2023-03-03 ENCOUNTER — Ambulatory Visit (INDEPENDENT_AMBULATORY_CARE_PROVIDER_SITE_OTHER): Payer: Medicare Other | Admitting: Student

## 2023-03-03 VITALS — BP 117/78 | HR 61

## 2023-03-03 DIAGNOSIS — S81802D Unspecified open wound, left lower leg, subsequent encounter: Secondary | ICD-10-CM

## 2023-03-03 DIAGNOSIS — S96902D Unspecified injury of unspecified muscle and tendon at ankle and foot level, left foot, subsequent encounter: Secondary | ICD-10-CM

## 2023-03-03 DIAGNOSIS — S86902D Unspecified injury of unspecified muscle(s) and tendon(s) at lower leg level, left leg, subsequent encounter: Secondary | ICD-10-CM

## 2023-03-03 DIAGNOSIS — S81002D Unspecified open wound, left knee, subsequent encounter: Secondary | ICD-10-CM

## 2023-03-03 DIAGNOSIS — S91002D Unspecified open wound, left ankle, subsequent encounter: Secondary | ICD-10-CM

## 2023-03-03 MED ORDER — OXYCODONE HCL 5 MG PO TABS: 5.0000 mg | ORAL_TABLET | Freq: Three times a day (TID) | ORAL | 0 refills | Status: DC | PRN

## 2023-03-03 MED ORDER — ONDANSETRON HCL 4 MG PO TABS: 4.0000 mg | ORAL_TABLET | Freq: Three times a day (TID) | ORAL | 0 refills | Status: DC | PRN

## 2023-03-03 NOTE — Progress Notes (Signed)
[name], male    DOB: [birth year], 57 y.o.   MRN: [id number]  Chief Complaint  Patient presents with   Pre-op Exam      ICD-10-CM   1. Open wound knee/leg with tendon involvment, left, subsequent encounter  S81.002D    S81.802D    S91.002D    S86.902D    S96.902D        History of Present Illness: [name] is a 57 y.o.  male  with a history of wound to the left foot.  He presents for preoperative evaluation for upcoming procedure, split thickness skin graft and wound VAC to the left lower extremity, scheduled for 03/22/2023 with Dr. Ladona Ridgel.  The patient has not had problems with anesthesia.  Patient reports he does see a cardiologist for A-fib.  He states that he recently saw his cardiologist and received clearance for this upcoming surgery.  Patient also reports he is on aspirin daily.  He states that his cardiologist also said it was okay to hold the aspirin 7 days prior to surgery.  We will confirm that cardiology clearance and clearance to hold aspirin was received.  Patient reports he is not a smoker.  He denies being on any hormone replacement.  Patient does have history of DVT.  He denies any family history of clots.  He denies any personal or family history of clotting diseases.  Patient reports that from February 26 until March 14 he was hospitalized for fever/sepsis.  He states that no source for infection was ever found, but his symptoms improved and he was eventually discharged.  He denies any issues since.  He denies any other recent surgeries or traumas.  He denies any history of stroke or heart attack.  He denies any history of Crohn's disease or ulcerative colitis.  He denies any history of COPD or asthma.  He denies any history of cancer.  Patient has visible varicose veins bilaterally.  Patient denies any recent fevers, chills or changes in his health.  Patient also reports that he has an area of dryness to the skin to the left shin, superior to his  wound.  He states it is the area right where the bandage in the boot meet.  He reports that he has been applying antibiotic cream to the area.  Summary of Previous Visit: Patient was last seen in the office on 02/03/2023.  At this visit, patient reported he was doing well.  He had recently been discharged from the hospital.  Patient reported the wound has continued to make good progress and was doing well with his wound care.  Patient reported he was still interested in moving forward with STSG to expedite his recovery so that he could be placed on the renal transplant list.  On exam, wound was approximately 7 x 5 cm.  There was complete coverage of the tendon and excellent granular tissue noted throughout.  Plan was to proceed with STSG and attempt to expedite with the recovery.  It was discussed with the patient that a wound VAC would also be used at the time of the STSG to optimize the outcome.   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  PMH Significant for: A-fib RVR, CHF, OSA on CPAP, GERD, ESRD on dialysis, BPH, history of DVTs   Past Medical History: Allergies: Allergies  Allergen Reactions   Vancomycin Swelling, Hives, Itching, Other (See Comments) and Rash  Lip swelling  Lip swelling, Reaction Type: Allergy; Reaction(s): swelling of lips  Reaction Type: Allergy; Reaction(s): swelling of lips    Current Medications:  Current Outpatient Medications:    acetaminophen (TYLENOL) 500 MG tablet, Take 1,000 mg by mouth every 6 (six) hours as needed (pain.)., Disp: , Rfl:    allopurinol (ZYLOPRIM) 100 MG tablet, Take 100 mg by mouth in the morning., Disp: , Rfl:    Ascorbic Acid (VITAMIN C PO), Take 500 mg by mouth in the morning., Disp: , Rfl:    aspirin EC 81 MG tablet, Take 81 mg by mouth in the morning., Disp: , Rfl:    atorvastatin (LIPITOR) 80 MG tablet, Take 80 mg by mouth at bedtime., Disp: , Rfl:    calcium carbonate (TUMS  EX) 750 MG chewable tablet, Chew 2 tablets by mouth at bedtime., Disp: , Rfl:    Cholecalciferol (VITAMIN D) 50 MCG (2000 UT) tablet, Take 2,000 Units by mouth in the morning., Disp: , Rfl:    cyclobenzaprine (FLEXERIL) 10 MG tablet, Take 10 mg by mouth daily as needed for muscle spasms., Disp: , Rfl:    famotidine (PEPCID) 20 MG tablet, Take 20 mg by mouth at bedtime., Disp: , Rfl:    metoprolol tartrate (LOPRESSOR) 25 MG tablet, Take 25 mg by mouth every other day., Disp: , Rfl:    midodrine (PROAMATINE) 5 MG tablet, Take 3 tablets (15 mg total) by mouth 3 (three) times daily with meals. (Patient taking differently: Take 15-20 mg by mouth See admin instructions. Take 4 tablets (20 mg) by mouth in the morning before dialysis on Mondays, Wednesday & Fridays and Take 3 tablets (15 mg) by mouth during dialysis sessions on Mondays, Wednesdays & Fridays.), Disp: 180 tablet, Rfl: 1   Multiple Vitamin (MULTIVITAMIN WITH MINERALS) TABS tablet, Take 1 tablet by mouth in the morning., Disp: , Rfl:    ondansetron (ZOFRAN) 4 MG tablet, Take 1 tablet (4 mg total) by mouth every 8 (eight) hours as needed for up to 10 doses for nausea or vomiting., Disp: 10 tablet, Rfl: 0   oxyCODONE (ROXICODONE) 5 MG immediate release tablet, Take 1 tablet (5 mg total) by mouth every 8 (eight) hours as needed for up to 10 doses for severe pain., Disp: 10 tablet, Rfl: 0   pantoprazole (PROTONIX) 40 MG tablet, Take 1 tablet (40 mg total) by mouth 2 (two) times daily. (Patient taking differently: Take 40 mg by mouth daily before breakfast.), Disp: 60 tablet, Rfl: 1   tamsulosin (FLOMAX) 0.4 MG CAPS capsule, Take 0.4 mg by mouth every evening., Disp: , Rfl:   Past Medical Problems: Past Medical History:  Diagnosis Date   Chronic heart failure with preserved ejection fraction (HFpEF) (HCC)    a. 03/2017 Echo: EF 55-65%, dil RV, RVSP , ml RV fxn, mild TR; b. 12/2019 Echo: EF 60-65%, mildly reduced RV fxn, mild BAE, No siginif  valvular dzs; c. 12/2021 RHC ([name]): RA 9, RV 57/14, PCWP 16, PA 50/25 (33). CO/CI ([facility]) 4.44/2.0.   Coronary artery disease    a. 10/2020 MV: prominently fixed apical defect w/ mod inflat ischemia. Inlat HK. Nl RV fxn; b. 03/2021 PCI LAD.   DVT (deep venous thrombosis) (HCC) 08/30/2022   a. L Leg-->completed 3 mos eliquis.   ESRD (end stage renal disease) (HCC)    a.  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 s/p PVI 2016; b. CHA2DS2VASc = 3--> was on warfarin and then eliquis-->08/2021 s/p Watchman device following GIB 03/2021.   Pneumonia    Pulmonary hypertension (HCC)    Sleep apnea    uses bipap nightly    Past Surgical History: Past Surgical History:  Procedure Laterality Date   ABLATION     APPLICATION OF WOUND VAC Left 11/30/2022   Procedure: APPLICATION OF WOUND VAC;  Surgeon: Santiago Glad, MD;  Location: [facility] OR;  Service: Plastics;  Laterality: Left;   CHOLECYSTECTOMY     COLONOSCOPY WITH PROPOFOL N/A 12/09/2020   non-bleeding internal hemorrhoids, sigmoid and descending colon diverticulosis, stool in entire examined colon.    ESOPHAGOGASTRODUODENOSCOPY (EGD) WITH PROPOFOL N/A 03/31/2021   active bleeding in duodenum due to suspected AVM s/p hemostatic spray   GIVENS CAPSULE STUDY N/A 06/04/2021   Procedure: GIVENS CAPSULE STUDY;  Surgeon: Lanelle Bal, DO;  Location: [facility] ENDO SUITE;  Service: Endoscopy;  Laterality: N/A;  7:30am   HEMOSTASIS CONTROL  03/31/2021   Procedure: HEMOSTASIS CONTROL;  Surgeon: Tressia Danas, MD;  Location: [facility] ENDOSCOPY;  Service: Gastroenterology;;   HERNIA MESH REMOVAL     abdominal   INCISION AND DRAINAGE OF WOUND Left 10/21/2022   Procedure: IRRIGATION AND DEBRIDEMENT WOUND left leg wound with placement of  myriad;  Surgeon: Santiago Glad, MD;  Location: [facility] OR;  Service: Plastics;  Laterality: Left;   INCISION AND DRAINAGE OF WOUND Left 11/30/2022   Procedure: debridement and preparation of wound, left leg;  Surgeon: Santiago Glad, MD;  Location: [facility] OR;  Service: Plastics;  Laterality: Left;   LEFT ATRIAL APPENDAGE OCCLUSION  09/10/2021   SMALL BOWEL ENTEROSCOPY     Normal stomach, normal duodenum, 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   TEE WITHOUT CARDIOVERSION N/A 01/20/2023   Procedure: TRANSESOPHAGEAL ECHOCARDIOGRAM (TEE);  Surgeon: Antoine Poche, MD;  Location: [facility] ORS;  Service: Endoscopy;  Laterality: N/A;   TRANSPLANTATION RENAL  2005    Social History: Social History   Socioeconomic History   Marital status: Married    Spouse name: Not on file   Number of children: 4   Years of education: Not on file   Highest education level: Not on file  Occupational History   Occupation: partial disabled   Occupation: host  Tobacco Use   Smoking status: Never   Smokeless tobacco: Never  Vaping Use   Vaping Use: Never used  Substance and Sexual Activity   Alcohol use: No   Drug use: No   Sexual activity: Not on file  Other Topics Concern   Not on file  Social History Narrative   Not on file   Social Determinants of Health   Financial Resource Strain: Not on file  Food Insecurity: No Food Insecurity (01/11/2023)   Hunger Vital Sign    Worried About Running Out of Food in the Last Year: Never true    Ran Out of Food in the Last Year: Never true  Transportation Needs: No Transportation Needs (01/11/2023)   PRAPARE - Transportation    Lack of Transportation (Medical): No    Lack of Transportation (Non-Medical): No  Physical Activity: Not on file  Stress: Not on file  Social Connections: Not on file  Intimate Partner Violence: Not At Risk (01/11/2023)   Humiliation, Afraid, Rape, and Kick questionnaire  Fear of Current or Ex-Partner: No    Emotionally Abused: No     Physically Abused: No    Sexually Abused: No    Family History: Family History  Problem Relation Age of Onset   Hypertension Mother    Heart attack Father    Hypertension Maternal Grandmother    Stroke Maternal Grandfather    Heart attack Paternal Uncle    Heart attack Paternal Uncle    Heart attack Paternal Aunt    Stroke Maternal Uncle     Review of Systems: Denies any recent fevers or chills  Physical Exam: Vital Signs BP 117/78 (BP Location: Right Arm, Patient Position: Sitting, Cuff Size: Small)   Pulse 61   SpO2 100%   Physical Exam  Constitutional:      General: Not in acute distress.    Appearance: Normal appearance. Not ill-appearing.  HENT:     Head: Normocephalic and atraumatic.  Neck:     Musculoskeletal: Normal range of motion.  Cardiovascular:     Rate and Rhythm: Normal rate    Pulses: Normal pulses.  Pulmonary:     Effort: Pulmonary effort is normal. No respiratory distress.  Musculoskeletal: Normal range of motion.  Lower Extremities: Varicosities noted bilaterally, there is some dryness/superficial abrasions noted to the left shin.  There does not appear to be any sign of infection. Skin:    General: Skin is warm and dry.     Findings: No erythema or rash.  Neurological:     Mental Status: Alert and oriented to person, place, and time. Mental status is at baseline.  Psychiatric:        Mood and Affect: Mood normal.        Behavior: Behavior normal.    Assessment/Plan: The patient is scheduled for split thickness skin graft and wound VAC to the left lower extremity with Dr. Ladona Ridgel.  Risks, benefits, and alternatives of procedure discussed, questions answered and consent obtained.    Smoking Status: Non-smoker; Counseling Given?  N/A  Caprini Score: 9; Risk Factors include: Age, history of DVT, varicose veins, BMI > 25, and length of planned surgery. Recommendation for mechanical and possible pharmacological prophylaxis. Encourage early  ambulation.  I will discuss the possibility of postoperative Lovenox with Dr. Ladona Ridgel.  I discussed with the patient that he should hold his aspirin 7 days prior to surgery.  I discussed with him that we will make sure we get clearance on this.  I also discussed with patient he should hold his Flexeril the morning of surgery.  I instructed the patient to hold any vitamins, multivitamins or supplements at least 1 week prior to surgery.  Patient expressed understanding.  We will make sure that we received surgical clearance from patient's cardiologist and we will also send clearance to patient's PCP.  Will place order to [facility] for wound vac.   Pictures obtained: 02/03/2023  Post-op Rx sent to pharmacy: Oxycodone, Zofran  Patient was provided with the General Surgical Risk consent document and Pain Medication Agreement prior to their appointment.  They had adequate time to read through the risk consent documents and Pain Medication Agreement. We also discussed them in person together during this preop appointment. All of their questions were answered to their satisfaction.  Recommended calling if they have any further questions.  Risk consent form and Pain Medication Agreement to be scanned into patient's chart.  I discussed with the patient that given his history of DVT, he may be at increased risk of  developing a clot after surgery.  I discussed with him the importance of ambulating after surgery.  Patient expressed understanding.  I also discussed with the patient that given his history of OSA on CPAP he may have more difficulty waking up from anesthesia.  Patient expressed understanding.   Electronically signed by: Laurena Spies, PA-C 03/03/2023 3:38 PM

## 2023-03-04 ENCOUNTER — Telehealth: Payer: Self-pay | Admitting: *Deleted

## 2023-03-04 NOTE — Telephone Encounter (Signed)
Surgical clearance request sent to Darrick Meigs, NP for pt to hold ASA 7 days prior to surgery with fax confirmation received [phone].  Request for surgical clearance faxed to Dr. Kizzie Bane (PCP) fax # [phone] with confirmation received.

## 2023-03-08 ENCOUNTER — Telehealth: Payer: Self-pay

## 2023-03-08 ENCOUNTER — Encounter: Payer: Self-pay | Admitting: Student

## 2023-03-08 NOTE — Progress Notes (Signed)
Surgical Clearance has been received from patient's cardiology provider, Tressie Stalker, NP,  for patient's upcoming surgery with Dr. Ladona Ridgel .  Per Tressie Stalker, patient is medically cleared to proceed with surgery.

## 2023-03-08 NOTE — Telephone Encounter (Signed)
Faxed wound vac order to Cyndra Numbers, with [facility], with confirmed receipt. Will scan into chart.

## 2023-03-08 NOTE — H&P (View-Only) (Signed)
Surgical Clearance has been received from patient's cardiology provider, Hailey Russell, NP,  for patient's upcoming surgery with Dr. Taylor .  Per Hailey Russell, patient is medically cleared to proceed with surgery.    

## 2023-03-11 ENCOUNTER — Telehealth: Payer: Self-pay | Admitting: Student

## 2023-03-11 NOTE — Telephone Encounter (Signed)
Surgical Clearance has been received from patient's PCP, Dr. Kizzie Bane, for patient's upcoming surgery with Dr. Ladona Ridgel .  Per Dr. Sherryll Burger, patient may hold his aspirin 7 days prior to surgery.

## 2023-03-16 NOTE — Pre-Procedure Instructions (Signed)
Surgical Instructions    Your procedure is scheduled on Mar 22, 2023.  Report to [facility] Main Entrance "A" at 11:15 A.M., then check in with the Admitting office.  Call this number if you have problems the morning of surgery:  [phone]  If you have any questions prior to your surgery date call [phone]: Open Monday-Friday 8am-4pm If you experience any cold or flu symptoms such as cough, fever, chills, shortness of breath, etc. between now and your scheduled surgery, please notify us at the above number.     Remember:  Do not eat after midnight the night before your surgery  You may drink clear liquids until 10:15 AM the morning of your surgery.   Clear liquids allowed are: Water, Non-Citrus Juices (without pulp), Carbonated Beverages, Clear Tea, Black Coffee Only (NO MILK, CREAM OR POWDERED CREAMER of any kind), and Gatorade.     Take these medicines the morning of surgery with A SIP OF WATER:  allopurinol (ZYLOPRIM)   metoprolol tartrate (LOPRESSOR)   midodrine (PROAMATINE)   pantoprazole (PROTONIX)    May take these medicines IF NEEDED:  acetaminophen (TYLENOL)   cyclobenzaprine (FLEXERIL)   ondansetron (ZOFRAN)   oxyCODONE (ROXICODONE)    Follow your surgeon's instructions on when to stop Aspirin.  If no instructions were given by your surgeon then you will need to call the office to get those instructions.     As of today, STOP taking any Aleve, Naproxen, Ibuprofen, Motrin, Advil, Goody's, BC's, all herbal medications, fish oil, and all vitamins.                     Do NOT Smoke (Tobacco/Vaping) for 24 hours prior to your procedure.  If you use a CPAP at night, you may bring your mask/headgear for your overnight stay.   Contacts, glasses, piercing's, hearing aid's, dentures or partials may not be worn into surgery, please bring cases for these belongings.    For patients admitted to the hospital, discharge time will be determined by your treatment team.    Patients discharged the day of surgery will not be allowed to drive home, and someone needs to stay with them for 24 hours.  SURGICAL WAITING ROOM VISITATION Patients having surgery or a procedure may have no more than 2 support people in the waiting area - these visitors may rotate.   Children under the age of 28 must have an adult with them who is not the patient. If the patient needs to stay at the hospital during part of their recovery, the visitor guidelines for inpatient rooms apply. Pre-op nurse will coordinate an appropriate time for 1 support person to accompany patient in pre-op.  This support person may not rotate.   Please refer to the [facility] website for the visitor guidelines for Inpatients (after your surgery is over and you are in a regular room).    Special instructions:   [place]- Preparing For Surgery  Before surgery, you can play an important role. Because skin is not sterile, your skin needs to be as free of germs as possible. You can reduce the number of germs on your skin by washing with CHG (chlorahexidine gluconate) Soap before surgery.  CHG is an antiseptic cleaner which kills germs and bonds with the skin to continue killing germs even after washing.    Oral Hygiene is also important to reduce your risk of infection.  Remember - BRUSH YOUR TEETH THE MORNING OF SURGERY WITH YOUR REGULAR TOOTHPASTE  Please do not use if you have an allergy to CHG or antibacterial soaps. If your skin becomes reddened/irritated stop using the CHG.  Do not shave (including legs and underarms) for at least 48 hours prior to first CHG shower. It is OK to shave your face.  Please follow these instructions carefully.   Shower the NIGHT BEFORE SURGERY and the MORNING OF SURGERY  If you chose to wash your hair, wash your hair first as usual with your normal shampoo.  After you shampoo, rinse your hair and body thoroughly to remove the shampoo.  Use CHG Soap as you would any other  liquid soap. You can apply CHG directly to the skin and wash gently with a scrungie or a clean washcloth.   Apply the CHG Soap to your body ONLY FROM THE NECK DOWN.  Do not use on open wounds or open sores. Avoid contact with your eyes, ears, mouth and genitals (private parts). Wash Face and genitals (private parts)  with your normal soap.   Wash thoroughly, paying special attention to the area where your surgery will be performed.  Thoroughly rinse your body with warm water from the neck down.  DO NOT shower/wash with your normal soap after using and rinsing off the CHG Soap.  Pat yourself dry with a CLEAN TOWEL.  Wear CLEAN PAJAMAS to bed the night before surgery  Place CLEAN SHEETS on your bed the night before your surgery  DO NOT SLEEP WITH PETS.   Day of Surgery: Take a shower with CHG soap. Do not wear jewelry or makeup Do not wear lotions, powders, perfumes/colognes, or deodorant. Do not shave 48 hours prior to surgery.  Men may shave face and neck. Do not bring valuables to the hospital.  [facility] is not responsible for any belongings or valuables. Do not wear nail polish, gel polish, artificial nails, or any other type of covering on natural nails (fingers and toes) If you have artificial nails or gel coating that need to be removed by a nail salon, please have this removed prior to surgery. Artificial nails or gel coating may interfere with anesthesia's ability to adequately monitor your vital signs.  Wear Clean/Comfortable clothing the morning of surgery Remember to brush your teeth WITH YOUR REGULAR TOOTHPASTE.   Please read over the following fact sheets that you were given.    If you received a COVID test during your pre-op visit  it is requested that you wear a mask when out in public, stay away from anyone that may not be feeling well and notify your surgeon if you develop symptoms. If you have been in contact with anyone that has tested positive in the last 10  days please notify you surgeon.

## 2023-03-17 ENCOUNTER — Encounter (HOSPITAL_COMMUNITY): Payer: Self-pay

## 2023-03-17 ENCOUNTER — Other Ambulatory Visit: Payer: Self-pay

## 2023-03-17 ENCOUNTER — Encounter (HOSPITAL_COMMUNITY)
Admission: RE | Admit: 2023-03-17 | Discharge: 2023-03-17 | Disposition: A | Discharge status: Home / Self Care | Payer: Medicare Other | Source: Ambulatory Visit | Attending: Plastic Surgery | Admitting: Plastic Surgery

## 2023-03-17 VITALS — BP 98/64 | HR 114 | Temp 98.2°F | Resp 18 | Ht 73.0 in | Wt 209.4 lb

## 2023-03-17 DIAGNOSIS — I272 Pulmonary hypertension, unspecified: Secondary | ICD-10-CM | POA: Insufficient documentation

## 2023-03-17 DIAGNOSIS — G4733 Obstructive sleep apnea (adult) (pediatric): Secondary | ICD-10-CM | POA: Insufficient documentation

## 2023-03-17 DIAGNOSIS — N186 End stage renal disease: Secondary | ICD-10-CM | POA: Insufficient documentation

## 2023-03-17 DIAGNOSIS — Z01812 Encounter for preprocedural laboratory examination: Secondary | ICD-10-CM | POA: Diagnosis present

## 2023-03-17 DIAGNOSIS — Z86718 Personal history of other venous thrombosis and embolism: Secondary | ICD-10-CM | POA: Insufficient documentation

## 2023-03-17 DIAGNOSIS — I5032 Chronic diastolic (congestive) heart failure: Secondary | ICD-10-CM | POA: Diagnosis not present

## 2023-03-17 DIAGNOSIS — I2510 Atherosclerotic heart disease of native coronary artery without angina pectoris: Secondary | ICD-10-CM | POA: Insufficient documentation

## 2023-03-17 DIAGNOSIS — I340 Nonrheumatic mitral (valve) insufficiency: Secondary | ICD-10-CM | POA: Insufficient documentation

## 2023-03-17 DIAGNOSIS — I4819 Other persistent atrial fibrillation: Secondary | ICD-10-CM | POA: Insufficient documentation

## 2023-03-17 DIAGNOSIS — Z01818 Encounter for other preprocedural examination: Secondary | ICD-10-CM

## 2023-03-17 HISTORY — DX: Other complications of anesthesia, initial encounter: T88.59XA

## 2023-03-17 LAB — CBC
HCT: 40.7 % (ref 39.0–52.0)
Hemoglobin: 12 g/dL — ABNORMAL LOW (ref 13.0–17.0)
MCH: 29.7 pg (ref 26.0–34.0)
MCHC: 29.5 g/dL — ABNORMAL LOW (ref 30.0–36.0)
MCV: 100.7 fL — ABNORMAL HIGH (ref 80.0–100.0)
Platelets: 249 10*3/uL (ref 150–400)
RBC: 4.04 MIL/uL — ABNORMAL LOW (ref 4.22–5.81)
RDW: 17.6 % — ABNORMAL HIGH (ref 11.5–15.5)
WBC: 7.6 10*3/uL (ref 4.0–10.5)
nRBC: 0 % (ref 0.0–0.2)

## 2023-03-17 NOTE — Progress Notes (Signed)
PCP - Dr. Clinton Sawyer Cardiologist - Darrick Meigs, PA Nephrologist - Dr. Lanier Clam. Pt has HD MWF and will have HD the day prior to surgery.  PPM/ICD - Denies Device Orders - n/a Rep Notified - n/a  Chest x-ray - 01/16/2023 EKG - 01/26/2023 Stress Test - 10/28/2020 ECHO - 01/20/2023 Cardiac Cath - May 2022 - Results Requested  Sleep Study - +OSA. Pt wears CPAP nightly. He does not know his pressure settings.  No DM  Last dose of GLP1 agonist- n/a GLP1 instructions: n/a  Blood Thinner Instructions: n/a Aspirin Instructions: Pts last dose of ASA was May 1st.  ERAS Protcol - Clear liquids until 1015 morning of surgery PRE-SURGERY Ensure or G2- n/a  COVID TEST- n/a   Anesthesia review: Yes. Cardiac and Medical Clearance. Last office note and cardiac cath from 2022 requested from cardiology office. Pt was also hospitalize at [facility] 01/10/23 through 01/25/23 for Sepsis, but a source of infection remains unknown. Pt is also working on getting back on the transplant list after this surgery.    Patient denies shortness of breath, fever, cough and chest pain at PAT appointment. Pt denies any respiratory illness/infection in the last two months.   All instructions explained to the patient, with a verbal understanding of the material. Patient agrees to go over the instructions while at home for a better understanding. Patient also instructed to self quarantine after being tested for COVID-19. The opportunity to ask questions was provided.
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
## 2023-03-22 ENCOUNTER — Other Ambulatory Visit: Payer: Self-pay

## 2023-03-22 ENCOUNTER — Encounter (HOSPITAL_COMMUNITY)
Admission: RE | Disposition: A | Discharge status: Home / Self Care | Payer: Self-pay | Source: Home / Self Care | Attending: Plastic Surgery

## 2023-03-22 ENCOUNTER — Ambulatory Visit (HOSPITAL_BASED_OUTPATIENT_CLINIC_OR_DEPARTMENT_OTHER): Payer: Medicare Other | Admitting: Certified Registered"

## 2023-03-22 ENCOUNTER — Ambulatory Visit (HOSPITAL_COMMUNITY)
Admission: RE | Admit: 2023-03-22 | Discharge: 2023-03-22 | Disposition: A | Discharge status: Home / Self Care | Payer: Medicare Other | Attending: Plastic Surgery | Admitting: Plastic Surgery

## 2023-03-22 ENCOUNTER — Encounter (HOSPITAL_COMMUNITY): Payer: Self-pay | Admitting: Plastic Surgery

## 2023-03-22 ENCOUNTER — Ambulatory Visit (HOSPITAL_COMMUNITY): Payer: Medicare Other | Admitting: Physician Assistant

## 2023-03-22 DIAGNOSIS — N186 End stage renal disease: Secondary | ICD-10-CM | POA: Insufficient documentation

## 2023-03-22 DIAGNOSIS — I2510 Atherosclerotic heart disease of native coronary artery without angina pectoris: Secondary | ICD-10-CM | POA: Diagnosis not present

## 2023-03-22 DIAGNOSIS — Z01818 Encounter for other preprocedural examination: Secondary | ICD-10-CM

## 2023-03-22 DIAGNOSIS — S81802D Unspecified open wound, left lower leg, subsequent encounter: Secondary | ICD-10-CM

## 2023-03-22 DIAGNOSIS — Z79899 Other long term (current) drug therapy: Secondary | ICD-10-CM | POA: Insufficient documentation

## 2023-03-22 DIAGNOSIS — X58XXXA Exposure to other specified factors, initial encounter: Secondary | ICD-10-CM | POA: Diagnosis not present

## 2023-03-22 DIAGNOSIS — Z992 Dependence on renal dialysis: Secondary | ICD-10-CM | POA: Insufficient documentation

## 2023-03-22 DIAGNOSIS — S81802A Unspecified open wound, left lower leg, initial encounter: Secondary | ICD-10-CM | POA: Insufficient documentation

## 2023-03-22 DIAGNOSIS — Z87891 Personal history of nicotine dependence: Secondary | ICD-10-CM | POA: Insufficient documentation

## 2023-03-22 DIAGNOSIS — G4730 Sleep apnea, unspecified: Secondary | ICD-10-CM | POA: Diagnosis not present

## 2023-03-22 DIAGNOSIS — I120 Hypertensive chronic kidney disease with stage 5 chronic kidney disease or end stage renal disease: Secondary | ICD-10-CM | POA: Diagnosis not present

## 2023-03-22 DIAGNOSIS — K219 Gastro-esophageal reflux disease without esophagitis: Secondary | ICD-10-CM | POA: Diagnosis not present

## 2023-03-22 DIAGNOSIS — Z09 Encounter for follow-up examination after completed treatment for conditions other than malignant neoplasm: Secondary | ICD-10-CM | POA: Diagnosis not present

## 2023-03-22 HISTORY — PX: SKIN SPLIT GRAFT: SHX444

## 2023-03-22 HISTORY — PX: APPLICATION OF WOUND VAC: SHX5189

## 2023-03-22 LAB — POCT I-STAT, CHEM 8
BUN: 32 mg/dL — ABNORMAL HIGH (ref 6–20)
Calcium, Ion: 1 mmol/L — ABNORMAL LOW (ref 1.15–1.40)
Chloride: 102 mmol/L (ref 98–111)
Creatinine, Ser: 10.9 mg/dL — ABNORMAL HIGH (ref 0.61–1.24)
Glucose, Bld: 83 mg/dL (ref 70–99)
HCT: 43 % (ref 39.0–52.0)
Hemoglobin: 14.6 g/dL (ref 13.0–17.0)
Potassium: 4 mmol/L (ref 3.5–5.1)
Sodium: 138 mmol/L (ref 135–145)
TCO2: 24 mmol/L (ref 22–32)

## 2023-03-22 SURGERY — APPLICATION, GRAFT, SKIN, SPLIT-THICKNESS
Anesthesia: General | Site: Leg Lower | Laterality: Left

## 2023-03-22 MED ORDER — MIDODRINE HCL 5 MG PO TABS
15.0000 mg | ORAL_TABLET | Freq: Once | ORAL | Status: AC
  Administered 2023-03-22: 15 mg via ORAL
  Filled 2023-03-22: qty 3

## 2023-03-22 MED ORDER — CHLORHEXIDINE GLUCONATE 0.12 % MT SOLN
15.0000 mL | Freq: Once | OROMUCOSAL | Status: DC
  Filled 2023-03-22: qty 15

## 2023-03-22 MED ORDER — LIDOCAINE 2% (20 MG/ML) 5 ML SYRINGE
INTRAMUSCULAR | Status: AC
  Filled 2023-03-22: qty 5

## 2023-03-22 MED ORDER — PHENYLEPHRINE HCL-NACL 20-0.9 MG/250ML-% IV SOLN
INTRAVENOUS | Status: DC | PRN
  Administered 2023-03-22: 50 ug/min via INTRAVENOUS

## 2023-03-22 MED ORDER — LIDOCAINE 2% (20 MG/ML) 5 ML SYRINGE
INTRAMUSCULAR | Status: DC | PRN
  Administered 2023-03-22: 30 mg via INTRAVENOUS

## 2023-03-22 MED ORDER — SUGAMMADEX SODIUM 200 MG/2ML IV SOLN
INTRAVENOUS | Status: DC | PRN
  Administered 2023-03-22: 200 mg via INTRAVENOUS

## 2023-03-22 MED ORDER — ORAL CARE MOUTH RINSE: 15.0000 mL | Freq: Once | OROMUCOSAL | Status: DC

## 2023-03-22 MED ORDER — OXYCODONE HCL 5 MG/5ML PO SOLN: 5.0000 mg | Freq: Once | ORAL | Status: DC | PRN

## 2023-03-22 MED ORDER — ALBUMIN HUMAN 5 % IV SOLN
INTRAVENOUS | Status: AC
  Filled 2023-03-22: qty 250

## 2023-03-22 MED ORDER — SODIUM CHLORIDE 0.9 % IV SOLN: INTRAVENOUS | Status: DC

## 2023-03-22 MED ORDER — PROPOFOL 10 MG/ML IV BOLUS
INTRAVENOUS | Status: AC
  Filled 2023-03-22: qty 20

## 2023-03-22 MED ORDER — CEFAZOLIN SODIUM-DEXTROSE 2-4 GM/100ML-% IV SOLN
2.0000 g | INTRAVENOUS | Status: AC
  Administered 2023-03-22: 2 g via INTRAVENOUS

## 2023-03-22 MED ORDER — BACITRACIN ZINC 500 UNIT/GM EX OINT
TOPICAL_OINTMENT | CUTANEOUS | Status: AC
  Filled 2023-03-22: qty 28.35

## 2023-03-22 MED ORDER — PHENYLEPHRINE 80 MCG/ML (10ML) SYRINGE FOR IV PUSH (FOR BLOOD PRESSURE SUPPORT)
PREFILLED_SYRINGE | INTRAVENOUS | Status: DC | PRN
  Administered 2023-03-22 (×2): 160 ug via INTRAVENOUS

## 2023-03-22 MED ORDER — MIDAZOLAM HCL 2 MG/2ML IJ SOLN: 0.5000 mg | Freq: Once | INTRAMUSCULAR | Status: DC | PRN

## 2023-03-22 MED ORDER — VASOPRESSIN 20 UNIT/ML IV SOLN
INTRAVENOUS | Status: AC
  Filled 2023-03-22: qty 1

## 2023-03-22 MED ORDER — BACITRACIN ZINC 500 UNIT/GM EX OINT
TOPICAL_OINTMENT | CUTANEOUS | Status: DC | PRN
  Administered 2023-03-22: 1 via TOPICAL

## 2023-03-22 MED ORDER — PHENYLEPHRINE HCL-NACL 20-0.9 MG/250ML-% IV SOLN: INTRAVENOUS | Status: DC | PRN

## 2023-03-22 MED ORDER — ROCURONIUM BROMIDE 10 MG/ML (PF) SYRINGE
PREFILLED_SYRINGE | INTRAVENOUS | Status: AC
  Filled 2023-03-22: qty 10

## 2023-03-22 MED ORDER — OXYCODONE HCL 5 MG PO TABS: 5.0000 mg | ORAL_TABLET | Freq: Once | ORAL | Status: DC | PRN

## 2023-03-22 MED ORDER — MEPERIDINE HCL 25 MG/ML IJ SOLN: 6.2500 mg | INTRAMUSCULAR | Status: DC | PRN

## 2023-03-22 MED ORDER — PROPOFOL 10 MG/ML IV BOLUS
INTRAVENOUS | Status: DC | PRN
  Administered 2023-03-22: 70 mg via INTRAVENOUS
  Administered 2023-03-22: 50 mg via INTRAVENOUS
  Administered 2023-03-22: 20 mg via INTRAVENOUS

## 2023-03-22 MED ORDER — ALBUMIN HUMAN 5 % IV SOLN
12.5000 g | Freq: Once | INTRAVENOUS | Status: AC
  Administered 2023-03-22: 12.5 g via INTRAVENOUS

## 2023-03-22 MED ORDER — CHLORHEXIDINE GLUCONATE CLOTH 2 % EX PADS: 6.0000 | MEDICATED_PAD | Freq: Once | CUTANEOUS | Status: DC

## 2023-03-22 MED ORDER — NOREPINEPHRINE 4 MG/250ML-% IV SOLN
INTRAVENOUS | Status: AC
  Filled 2023-03-22: qty 250

## 2023-03-22 MED ORDER — ROCURONIUM BROMIDE 10 MG/ML (PF) SYRINGE
PREFILLED_SYRINGE | INTRAVENOUS | Status: DC | PRN
  Administered 2023-03-22: 50 mg via INTRAVENOUS

## 2023-03-22 MED ORDER — LACTATED RINGERS IV SOLN: INTRAVENOUS | Status: DC

## 2023-03-22 MED ORDER — PHENYLEPHRINE HCL-NACL 20-0.9 MG/250ML-% IV SOLN
INTRAVENOUS | Status: AC
  Filled 2023-03-22: qty 250

## 2023-03-22 MED ORDER — ONDANSETRON HCL 4 MG/2ML IJ SOLN
INTRAMUSCULAR | Status: DC | PRN
  Administered 2023-03-22: 4 mg via INTRAVENOUS

## 2023-03-22 MED ORDER — ONDANSETRON HCL 4 MG/2ML IJ SOLN
INTRAMUSCULAR | Status: AC
  Filled 2023-03-22: qty 2

## 2023-03-22 MED ORDER — DEXAMETHASONE SODIUM PHOSPHATE 10 MG/ML IJ SOLN
INTRAMUSCULAR | Status: AC
  Filled 2023-03-22: qty 1

## 2023-03-22 MED ORDER — FENTANYL CITRATE (PF) 250 MCG/5ML IJ SOLN
INTRAMUSCULAR | Status: DC | PRN
  Administered 2023-03-22: 50 ug via INTRAVENOUS

## 2023-03-22 MED ORDER — CEFAZOLIN SODIUM-DEXTROSE 2-4 GM/100ML-% IV SOLN
INTRAVENOUS | Status: AC
  Filled 2023-03-22: qty 100

## 2023-03-22 MED ORDER — ALBUMIN HUMAN 5 % IV SOLN: INTRAVENOUS | Status: DC | PRN

## 2023-03-22 MED ORDER — ACETAMINOPHEN 500 MG PO TABS
1000.0000 mg | ORAL_TABLET | Freq: Once | ORAL | Status: DC
  Filled 2023-03-22: qty 2

## 2023-03-22 MED ORDER — LIDOCAINE-EPINEPHRINE 1 %-1:100000 IJ SOLN
INTRAMUSCULAR | Status: AC
  Filled 2023-03-22: qty 2

## 2023-03-22 MED ORDER — ESMOLOL HCL 100 MG/10ML IV SOLN
INTRAVENOUS | Status: DC | PRN
  Administered 2023-03-22: 50 mg via INTRAVENOUS

## 2023-03-22 MED ORDER — VASOPRESSIN 20 UNIT/ML IV SOLN
INTRAVENOUS | Status: DC | PRN
  Administered 2023-03-22 (×3): 1 [IU] via INTRAVENOUS

## 2023-03-22 MED ORDER — DEXAMETHASONE SODIUM PHOSPHATE 10 MG/ML IJ SOLN
INTRAMUSCULAR | Status: DC | PRN
  Administered 2023-03-22: 10 mg via INTRAVENOUS

## 2023-03-22 MED ORDER — FENTANYL CITRATE (PF) 250 MCG/5ML IJ SOLN
INTRAMUSCULAR | Status: AC
  Filled 2023-03-22: qty 5

## 2023-03-22 MED ORDER — LIDOCAINE-EPINEPHRINE 1 %-1:100000 IJ SOLN
INTRAMUSCULAR | Status: DC | PRN
  Administered 2023-03-22 (×2): 20 mL

## 2023-03-22 MED ORDER — MIDAZOLAM HCL 2 MG/2ML IJ SOLN
INTRAMUSCULAR | Status: AC
  Filled 2023-03-22: qty 2

## 2023-03-22 MED ORDER — FENTANYL CITRATE (PF) 100 MCG/2ML IJ SOLN: 25.0000 ug | INTRAMUSCULAR | Status: DC | PRN

## 2023-03-22 MED ORDER — MINERAL OIL LIGHT OIL
TOPICAL_OIL | Freq: Once | Status: AC
  Administered 2023-03-22: 1 via TOPICAL
  Filled 2023-03-22: qty 20

## 2023-03-22 MED ORDER — PROMETHAZINE HCL 25 MG/ML IJ SOLN: 6.2500 mg | INTRAMUSCULAR | Status: DC | PRN

## 2023-03-22 SURGICAL SUPPLY — 54 items
ALLOGRAFT SALERA POWDER 160 (Graft) IMPLANT
BALL CTTN LRG ABS STRL LF (GAUZE/BANDAGES/DRESSINGS)
BLADE DERMATOME SS (BLADE) ×1 IMPLANT
BNDG CMPR 5X4 KNIT ELC UNQ LF (GAUZE/BANDAGES/DRESSINGS) ×1
BNDG CMPR 5X62 HK CLSR LF (GAUZE/BANDAGES/DRESSINGS) ×1
BNDG ELASTIC 4INX 5YD STR LF (GAUZE/BANDAGES/DRESSINGS) IMPLANT
BNDG ELASTIC 4X5.8 VLCR STR LF (GAUZE/BANDAGES/DRESSINGS) IMPLANT
BNDG ELASTIC 6INX 5YD STR LF (GAUZE/BANDAGES/DRESSINGS) IMPLANT
BNDG ELASTIC 6X5.8 VLCR STR LF (GAUZE/BANDAGES/DRESSINGS) IMPLANT
BNDG GAUZE DERMACEA FLUFF 4 (GAUZE/BANDAGES/DRESSINGS) IMPLANT
BNDG GZE DERMACEA 4 6PLY (GAUZE/BANDAGES/DRESSINGS) ×2
CANISTER SUCT 3000ML PPV (MISCELLANEOUS) IMPLANT
CANISTER WOUND CARE 500ML ATS (WOUND CARE) IMPLANT
COTTONBALL LRG STERILE PKG (GAUZE/BANDAGES/DRESSINGS) IMPLANT
COVER SURGICAL LIGHT HANDLE (MISCELLANEOUS) ×1 IMPLANT
DERMACARRIERS GRAFT 1 TO 1.5 (DISPOSABLE) ×1
DRAPE ORTHO SPLIT 77X108 STRL (DRAPES) ×2
DRAPE SURG ORHT 6 SPLT 77X108 (DRAPES) ×2 IMPLANT
DRESSING MEPILEX FLEX 4X4 (GAUZE/BANDAGES/DRESSINGS) IMPLANT
DRSG ADAPTIC 3X8 NADH LF (GAUZE/BANDAGES/DRESSINGS) IMPLANT
DRSG CALCIUM ALGINATE 4X4 (GAUZE/BANDAGES/DRESSINGS) IMPLANT
DRSG CUTIMED SORBACT 7X9 (GAUZE/BANDAGES/DRESSINGS) IMPLANT
DRSG HYDROCOLLOID 4X4 (GAUZE/BANDAGES/DRESSINGS) IMPLANT
DRSG MEPILEX FLEX 4X4 (GAUZE/BANDAGES/DRESSINGS) ×2
DRSG VAC GRANUFOAM LG (GAUZE/BANDAGES/DRESSINGS) IMPLANT
DRSG VAC GRANUFOAM MED (GAUZE/BANDAGES/DRESSINGS) IMPLANT
DRSG VAC GRANUFOAM SM (GAUZE/BANDAGES/DRESSINGS) IMPLANT
ELECT REM PT RETURN 9FT ADLT (ELECTROSURGICAL)
ELECTRODE REM PT RTRN 9FT ADLT (ELECTROSURGICAL) IMPLANT
GAUZE PAD ABD 8X10 STRL (GAUZE/BANDAGES/DRESSINGS) ×1 IMPLANT
GAUZE SPONGE 4X4 12PLY STRL (GAUZE/BANDAGES/DRESSINGS) ×1 IMPLANT
GAUZE XEROFORM 5X9 LF (GAUZE/BANDAGES/DRESSINGS) ×1 IMPLANT
GLOVE BIO SURGEON STRL SZ 6.5 (GLOVE) ×2 IMPLANT
GOWN STRL REUS W/ TWL LRG LVL3 (GOWN DISPOSABLE) ×2 IMPLANT
GOWN STRL REUS W/TWL LRG LVL3 (GOWN DISPOSABLE) ×3
GRAFT DERMACARRIERS 1 TO 1.5 (DISPOSABLE) ×1 IMPLANT
KIT BASIN OR (CUSTOM PROCEDURE TRAY) ×1 IMPLANT
KIT TURNOVER KIT B (KITS) ×1 IMPLANT
MARKER SKIN DUAL TIP RULER LAB (MISCELLANEOUS) IMPLANT
NDL HYPO 25GX1X1/2 BEV (NEEDLE) ×1 IMPLANT
NEEDLE HYPO 25GX1X1/2 BEV (NEEDLE) ×1 IMPLANT
NS IRRIG 1000ML POUR BTL (IV SOLUTION) ×1 IMPLANT
PACK GENERAL/GYN (CUSTOM PROCEDURE TRAY) ×1 IMPLANT
PAD ABD 8X10 STRL (GAUZE/BANDAGES/DRESSINGS) IMPLANT
PAD ARMBOARD 7.5X6 YLW CONV (MISCELLANEOUS) ×2 IMPLANT
STAPLER VISISTAT 35W (STAPLE) ×1 IMPLANT
SUT CHROMIC 3 0 PS 2 (SUTURE) IMPLANT
SUT CHROMIC 4 0 PS 2 18 (SUTURE) IMPLANT
SUT SILK 4 0 PS 2 (SUTURE) IMPLANT
SUT VIC AB 5-0 PS2 18 (SUTURE) IMPLANT
SYR CONTROL 10ML LL (SYRINGE) ×1 IMPLANT
TOWEL GREEN STERILE (TOWEL DISPOSABLE) ×1 IMPLANT
TOWEL GREEN STERILE FF (TOWEL DISPOSABLE) ×1 IMPLANT
UNDERPAD 30X36 HEAVY ABSORB (UNDERPADS AND DIAPERS) ×1 IMPLANT

## 2023-03-22 NOTE — Interval H&P Note (Signed)
History and Physical Interval Note: Pt met in the pre op area, no change in physical exam or indication for surgery Surgical site and donor site marked. All questions answered. Will proceed with a STSG to the left lower extremity at the patients request.  03/22/2023 1:12 PM  [name]  has presented today for surgery, with the diagnosis of left leg wound.  The various methods of treatment have been discussed with the patient and family. After consideration of risks, benefits and other options for treatment, the patient has consented to  Procedure(s): STSG and application of wound VAC left lower leg (Left) APPLICATION OF WOUND VAC (Left) as a surgical intervention.  The patient's history has been reviewed, patient examined, no change in status, stable for surgery.  I have reviewed the patient's chart and labs.  Questions were answered to the patient's satisfaction.     [name]

## 2023-03-22 NOTE — Discharge Instructions (Addendum)
-  Avoid overuse of your right lower extremity. Wear a medical boot to the right lower extremity if you have one.  -No showers or getting the surgical sites wet until you have been cleared by a provider at one of your postoperative visits.  -NO driving while taking pain medications  -No heavy activities  Diet: Regular, Try to optimize nutrition with plenty of fruits and vegetables to improve healing. Wound Care: Keep dressing clean & dry. Do not change dressings unless instructed at your postoperative visits.   Do not change dressings  You may restart your aspirin in 24 hours.   Call doctor if any unusual problems occur such as pain, excessive bleeding, unrelieved nausea/vomiting, fever &/or chills  Follow-up appointment: Please follow up in the clinic this Thursday and next Monday.   Medications: Called in at your pre-op appointment.

## 2023-03-22 NOTE — Anesthesia Procedure Notes (Signed)
Procedure Name: Intubation Date/Time: 03/22/2023 1:42 PM  Performed by: Alwyn Ren, CRNAPre-anesthesia Checklist: Patient identified, Emergency Drugs available, Suction available and Patient being monitored Patient Re-evaluated:Patient Re-evaluated prior to induction Oxygen Delivery Method: Circle system utilized Preoxygenation: Pre-oxygenation with 100% oxygen Induction Type: IV induction Ventilation: Mask ventilation without difficulty Laryngoscope Size: Mac and 4 Grade View: Grade II Tube type: Oral Tube size: 7.5 mm Number of attempts: 1 Airway Equipment and Method: Stylet Placement Confirmation: ETT inserted through vocal cords under direct vision, positive ETCO2 and breath sounds checked- equal and bilateral Secured at: 23 cm Tube secured with: Tape Dental Injury: Teeth and Oropharynx as per pre-operative assessment  Comments: Intubation preformed by Charlann Noss, SRNA

## 2023-03-22 NOTE — Op Note (Signed)
DATE OF OPERATION: 03/22/2023  LOCATION: [facility] Main operating Room  PREOPERATIVE DIAGNOSIS: Open wound left leg, 4 x 4 cm  POSTOPERATIVE DIAGNOSIS: Same  PROCEDURE: Split-thickness skin graft, 5 x 4 cm  SURGEON: Loren Racer, MD  ASSISTANT: Caroline More, primary, Matt Scheeler  EBL: 5 cc  CONDITION: Stable  COMPLICATIONS: None  INDICATION: The patient, [name], is a 57 y.o. male born on September 04, 1966, is here for treatment of an open wound on the anterior surface of the left leg.  Patient has been treated with Integra and dressing changes however the final portion of the wound is failed to completely reepithelialize.  A split-thickness skin graft was placed to speed healing so the patient can return to the renal transplant list as soon as possible.  PROCEDURE DETAILS:  The patient was seen prior to surgery and marked.  IV antibiotics were given. The patient was taken to the operating room and given a general anesthetic. A standard time out was performed and all information was confirmed by those in the room. SCDs were placed.   Left lower extremity was prepped with Betadine and draped in the usual sterile manner.  The wound which measured 4 x 4 cm was cleaned and thickened skin at the borders was removed with sharp and blunt dissection.  The upper thigh was used for the donor site.  Mineral oil was applied to the donor site and the dermatome with a 5 cm guard was used to harvest a 4 cm x 5 cm portion of split thickness epidermis.  The harvested skin was then meshed 2-1 and was placed on the the wound after coating the wound with Solara.  The graft was sutured in place with interrupted 3-0 chromic sutures.  Adaptic was placed over the skin graft and a wound VAC was applied.  The donor site was dressed with Adaptic and Mepilex border dressings. The patient was allowed to wake up and taken to recovery room in stable condition at the end of the case.  All instrument needle and sponge counts were  reported as correct.  There were no complications.  The family was notified at the end of the case.   The advanced practice practitioner (APP) assisted throughout the case.  The APP was essential in retraction and counter traction when needed to make the case progress smoothly.  This retraction and assistance made it possible to see the tissue plans for the procedure.  The assistance was needed for blood control, tissue re-approximation and assisted with closure of the incision site.

## 2023-03-22 NOTE — Transfer of Care (Signed)
Immediate Anesthesia Transfer of Care Note  Patient: [name]  Procedure(s) Performed: SPLIT THICKNESS SKIN GRAFT LEFT LOWER LEG (Left: Leg Lower) APPLICATION OF WOUND VAC LEFT LOWER LEG (Left: Leg Lower)  Patient Location: PACU  Anesthesia Type:General  Level of Consciousness: awake, drowsy, and patient cooperative  Airway & Oxygen Therapy: Patient Spontanous Breathing and Patient connected to face mask oxygen  Post-op Assessment: Report given to RN, Post -op Vital signs reviewed and stable, and Patient moving all extremities  Post vital signs: Reviewed and stable  Last Vitals:  Vitals Value Taken Time  BP 85/42 03/22/23 1445  Temp 36.6 C 03/22/23 1440  Pulse 118 03/22/23 1446  Resp 18 03/22/23 1446  SpO2 95 % 03/22/23 1446  Vitals shown include unvalidated device data.  Last Pain:  Vitals:   03/22/23 1131  TempSrc:   PainSc: 0-No pain      Patients Stated Pain Goal: 0 (03/22/23 1131)  Complications: There were no known notable events for this encounter.

## 2023-03-22 NOTE — Anesthesia Postprocedure Evaluation (Signed)
Anesthesia Post Note  Patient: [name]  Procedure(s) Performed: SPLIT THICKNESS SKIN GRAFT LEFT LOWER LEG (Left: Leg Lower) APPLICATION OF WOUND VAC LEFT LOWER LEG (Left: Leg Lower)     Patient location during evaluation: PACU Anesthesia Type: General Level of consciousness: awake and alert, patient cooperative and oriented Pain management: pain level controlled Vital Signs Assessment: post-procedure vital signs reviewed and stable Respiratory status: spontaneous breathing, nonlabored ventilation and respiratory function stable Cardiovascular status: blood pressure returned to baseline and stable (pt to continue mitodrine at home) Postop Assessment: no apparent nausea or vomiting, able to ambulate and adequate PO intake Anesthetic complications: no   There were no known notable events for this encounter.  Last Vitals:  Vitals:   03/22/23 1530 03/22/23 1545  BP: (!) 70/49 (!) 72/51  Pulse: (!) 119 (!) 117  Resp: (!) 23 (!) 21  Temp:    SpO2: 96% 96%    Last Pain:  Vitals:   03/22/23 1515  TempSrc:   PainSc: 0-No pain                 Giovannina Mun,E. Alayah Knouff

## 2023-03-23 ENCOUNTER — Ambulatory Visit (INDEPENDENT_AMBULATORY_CARE_PROVIDER_SITE_OTHER): Payer: Medicare Other | Admitting: Student

## 2023-03-23 ENCOUNTER — Encounter (HOSPITAL_COMMUNITY): Payer: Self-pay | Admitting: Plastic Surgery

## 2023-03-23 DIAGNOSIS — S86902D Unspecified injury of unspecified muscle(s) and tendon(s) at lower leg level, left leg, subsequent encounter: Secondary | ICD-10-CM

## 2023-03-23 DIAGNOSIS — Z9889 Other specified postprocedural states: Secondary | ICD-10-CM

## 2023-03-23 NOTE — Progress Notes (Signed)
Patient is a 57 year old male with history of a wound to his left leg.  He underwent split thickness skin graft to the left leg wound with Dr. Ladona Ridgel yesterday, 03/22/2023.  Patient reports she is doing well today.  He states that he had some issues with the wound VAC spontaneously turning off.  He states that it is working now.  He reports that he is going to call the wound [facility] company for assistance with this.  Patient states that the dressing to the left thigh is saturated.  He states that he will just reinforce this with an ABD pad until he follows up with [place] in the clinic tomorrow.  Patient also states that he has his boot on to his left lower extremity.  Patient plans to come to his appointment tomorrow morning.  I instructed the patient to call in the meantime if he has any questions or concerns about anything.  Today's encounter was done via telephone.  We spent approximately 2 to 3 minutes on the phone during today's visit.

## 2023-03-24 ENCOUNTER — Ambulatory Visit (INDEPENDENT_AMBULATORY_CARE_PROVIDER_SITE_OTHER): Payer: Medicare Other | Admitting: Plastic Surgery

## 2023-03-24 ENCOUNTER — Encounter: Payer: Self-pay | Admitting: Plastic Surgery

## 2023-03-24 VITALS — BP 94/58 | HR 105

## 2023-03-24 DIAGNOSIS — Z945 Skin transplant status: Secondary | ICD-10-CM

## 2023-03-24 NOTE — Progress Notes (Signed)
[name] returns today 2 days postop from a split-thickness skin graft to his left lower extremity.  He had some bleeding at the donor site and requested a dressing change.  The dressing was removed down to the Adaptic replaced with a new Mepilex border dressing.  He is otherwise doing well with no complaints.  On Monday for removal of the wound VAC.

## 2023-03-28 ENCOUNTER — Ambulatory Visit (INDEPENDENT_AMBULATORY_CARE_PROVIDER_SITE_OTHER): Payer: Medicare Other | Admitting: Student

## 2023-03-28 DIAGNOSIS — Z945 Skin transplant status: Secondary | ICD-10-CM

## 2023-03-28 NOTE — Progress Notes (Signed)
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  Patient most recently underwent split thickness skin graft with Dr. Ladona Ridgel on 03/22/2023.  Intraoperatively, skin was harvested from the donor site at the left upper thigh.  The graft was then sutured into place with 3-0 chromic sutures.  Adaptic was placed over the skin graft and wound VAC was applied.  The donor site was dressed with Adaptic and Mepilex border dressings.  Patient presents to the clinic today for postoperative follow-up and wound VAC removal.  Today, patient reports he is doing well.  He denies any issues or concerns at this time.  He denies any fevers or chills.  Patient reports he has left the Mepilex border dressing in place over the donor site.  On exam, patient is sitting upright in no acute distress.  Wound VAC was removed from left lower extremity.  Wound to left lower extremity is noted to be 4 cm x 4 cm.  Skin graft in place over the wound.  The graft itself appears to be viable, but it does not appear to have incorporated into the wound bed yet.  There is no surrounding erythema or drainage.  To the donor site, there is good granulation tissue noted.  There are no signs of infection to the donor site.  Adaptic and Mepilex border were placed to the donor site.  Adaptic, Vaseline, Mepilex border, Curlex and Ace wrap were placed to the lower leg.  I discussed with the patient that I would like him to keep the Mepilex border dressing in place to the donor site for now.  I discussed with him that to the lower leg, I would like him to apply Adaptic and Vaseline daily, cover with a Mepilex border dressing and wrap with Ace wrap and Kerlix.  Patient expressed understanding.  I discussed with him I would still would like him to avoid getting the area wet in the shower.  I encouraged him to continue to wear his boot as this will help avoid disturbance to the wound bed.   Patient expressed understanding.  Patient to follow back up in the clinic on Thursday for reevaluation.  I instructed the patient to call in the meantime if he has any questions or concerns.  Pictures were obtained of the patient and placed in the chart with the patient's or guardian's permission.

## 2023-03-29 ENCOUNTER — Telehealth: Payer: Self-pay

## 2023-03-29 NOTE — Telephone Encounter (Signed)
Faxed wound supply order to [facility] with demographics and most recent ov note included. Received fax success confirmation. Forwarding original order to front desk for batch scanning.

## 2023-03-31 ENCOUNTER — Encounter: Payer: Medicare Other | Admitting: Plastic Surgery

## 2023-03-31 ENCOUNTER — Ambulatory Visit (INDEPENDENT_AMBULATORY_CARE_PROVIDER_SITE_OTHER): Payer: Medicare Other | Admitting: Student

## 2023-03-31 VITALS — BP 101/68 | HR 101

## 2023-03-31 DIAGNOSIS — Z945 Skin transplant status: Secondary | ICD-10-CM

## 2023-03-31 NOTE — Progress Notes (Signed)
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  Patient most recently underwent split-thickness skin graft with Dr. Ladona Ridgel on 03/22/2023.  Patient presents to the clinic today for further follow-up.  Patient was last seen in the clinic on 03/28/2023.  At this visit, patient reported he is doing well.  He denied any specific issues or complaints.  On exam, the wound VAC was removed from the lower leg.  Skin graft was in place over the wound, the graft itself appeared to be viable, but did not appear to incorporate into the wound bed yet.  Plan is for patient to apply Adaptic, Vaseline and a Mepilex border, Curlex and Ace wrap to the lower leg.  Adaptic and Mepilex border were placed to the donor site.  Plan is for patient to follow-up in a few days for reevaluation.  Today, patient reports he is doing well.  He has no new concerns or complaints today.  On exam, patient is sitting upright in no acute distress.  Lower leg wound is approximately 4 cm x 4 cm.  The skin graft to the lower leg wound appears to be viable and also has appeared to adhere/incorporate to the wound bed a little bit more than previous exam.  There is no surrounding erythema.  There is no drainage.  To the donor site to the left thigh, it appears to be healing well.  Tissue appears to be healthy.  There are no signs of infection on exam.  I discussed with the patient that he should continue to apply Adaptic, Vaseline and a Mepilex border dressing daily to the lower leg wound as well as the thigh wound.  We also he should continue wearing his boot as to avoid overuse of the lower leg which may disrupt the graft.  Patient expressed understanding.  All the patient's questions were answered to his satisfaction.  Plan is for patient to come back next week for reevaluation.  I instructed the patient to call in the meantime if she has any questions or concerns.  Dr.  Ladona Ridgel also had the opportunity to examine the patient today and discussed the plan with him.

## 2023-04-01 ENCOUNTER — Telehealth: Payer: Self-pay | Admitting: Student

## 2023-04-01 NOTE — Telephone Encounter (Signed)
Patient called and said that he was seen the other day and that he doesn't think he got enough metilex border. He said the wound on the bottom takes the 4x4 and the big one is a bigger one. He said that he got 12 of the 1 for the bottom but none for the top. Please advise. Call back is [phone]

## 2023-04-04 NOTE — Telephone Encounter (Signed)
Spoke to patient regarding mepilex borders. I put in a request with [facility] to change the 4x4 to 6x6. Pt aware. Faxed with confirmed receipt. Will scan to patient's chart.

## 2023-04-05 ENCOUNTER — Telehealth: Payer: Self-pay

## 2023-04-05 NOTE — Telephone Encounter (Signed)
Faxed wound care supply request along with OV note, insurance info, and demographics to [facility] with confirmed receipt.

## 2023-04-07 ENCOUNTER — Encounter: Payer: Medicare Other | Admitting: Student

## 2023-04-07 ENCOUNTER — Ambulatory Visit (INDEPENDENT_AMBULATORY_CARE_PROVIDER_SITE_OTHER): Payer: Medicare Other | Admitting: Student

## 2023-04-07 ENCOUNTER — Encounter: Payer: Self-pay | Admitting: Plastic Surgery

## 2023-04-07 VITALS — BP 114/82 | HR 130

## 2023-04-07 DIAGNOSIS — Z945 Skin transplant status: Secondary | ICD-10-CM

## 2023-04-07 NOTE — Progress Notes (Addendum)
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  Patient most recently underwent split-thickness skin graft with Dr. Ladona Ridgel on 03/22/2023.  Patient presents to the clinic today for further follow-up.     Patient was last seen in the clinic on 03/31/2023.  At this visit, patient reported he is doing well.  Lower leg wound was approximately 4 cm x 4 cm.  The skin graft to the lower leg wound appears to be viable and also appeared to had incorporated into the wound bed a little bit more than the previous exam.  The donor site to the left thigh appeared to be healing well and there were no signs of infection.  Plan is for patient to apply Adaptic Vaseline and Mepilex border daily to the lower leg as well as the thigh wound.  Plan was for him to also continue to wear his boot to avoid overuse of the lower leg.  Today, patient reports he is doing well.  He reports dressing changes are going well.  He reports there is a slight odor to his lower extremity wound.  He denies any fevers or chills.  On exam, patient is sitting upright in no acute distress.  Lower leg wound is approximately 4 cm x 4 cm.  Graft appears to have incorporated into the wound bed.  Wound bed appears to be healthy and vascularized.  There is no drainage or surrounding erythema.  To the donor site, wound appears to be healthy and healing well.  There are no signs of infection on exam.  There is a slight odor on exam.  Discussed with the patient that he should continue with Adaptic, Vaseline and Mepilex border daily to both the donor site and the graft site.  We discussed with the patient that he can apply gauze soaked in Vashe and leave on for about 10 minutes daily to the lower leg wound.  Patient expressed understanding.  Patient to continue to wear boot for now.  Patient to remain out of work for another week until 04/19/2023 given healing process.  We will have the  patient return back to the clinic next week to reevaluate him for restrictions.  I instructed the patient to call back if he has any questions or concerns about anything.  Dr. Ladona Ridgel also had the opportunity to examine the patient today and agrees with the plan.

## 2023-04-14 ENCOUNTER — Encounter: Payer: Self-pay | Admitting: Plastic Surgery

## 2023-04-14 ENCOUNTER — Ambulatory Visit (INDEPENDENT_AMBULATORY_CARE_PROVIDER_SITE_OTHER): Payer: Medicare Other | Admitting: Plastic Surgery

## 2023-04-14 ENCOUNTER — Telehealth: Payer: Self-pay | Admitting: Plastic Surgery

## 2023-04-14 VITALS — BP 73/49 | HR 109

## 2023-04-14 DIAGNOSIS — Z945 Skin transplant status: Secondary | ICD-10-CM

## 2023-04-14 NOTE — Progress Notes (Signed)
[name] returns today approximately 3 weeks after a skin graft to the left ankle.  He is doing well with no specific complaints.  On examination the skin graft is taking quite well and the wound is essentially closed.  The donor site has healed except for an area approximately 2 to 3 mm where he still has a small amount of granulation tissue.  Patient may switch over to just a moisturizing cream for the skin graft and a bandage to prevent rubbing against tissue.  The donor site may be covered with a simple Band-Aid.  He may return to work on June 10.  We will provide a letter for his transplant team that his wounds are now closed and he may be considered again for a kidney transplant.  Follow-up in 2 months.

## 2023-04-14 NOTE — Telephone Encounter (Signed)
Pt stated when he was leaving that Dr Ladona Ridgel cleared him for transplant sx. If so would you like me to send a fax to the coord. I have her information. I called.[name] Fax [phone] att Lucy [name]

## 2023-04-14 NOTE — Telephone Encounter (Signed)
We need to know specifically what the letter needs to say. I have documented in his note that the wound is closed and he can be put on the transplant list again.

## 2023-04-28 ENCOUNTER — Encounter: Payer: Medicare Other | Admitting: Student

## 2023-05-18 NOTE — Progress Notes (Signed)
Patient is a pleasant 57 year old male with PMH of left ankle wound s/p STSG placement 03/22/2023 who presents to clinic for postoperative follow-up.  He was last seen here in clinic, and 04/14/2023.  At that time, wound was effectively closed and his exam was entirely benign.  May return to work on April 25, 2023 and letter was provided to transplant team stating that he was ready to proceed with kidney transplant.  Follow-up in 2 months.  Today, patient is doing okay.  He presents to the clinic accompanied by his mother at bedside.  He states that he has a wound at the STSG placement site for which she has been applying Vaseline followed by silicone bordered bandage daily.  He changed every day.  He washes it with soap and water.  He did not proceed to get the Vashe due to the cost.  Denies any pain or fevers.  Denies any leg swelling.  He is back to work, ambulating regularly.  He reports that they did find a small blockage on his catheterization and is planning a bypass later this month.  On exam, wound above left ankle measures 4 x 3 cm, slough at base.  Some protruding granular tissue.  Surrounding skin appears to be well-healed.  No surrounding erythema or swelling.  No malodor.  No purulent drainage.  STSG harvest site appears to be well-healed.  Will prescribe Santyl to help with the slough, operatively given that he did not move forward with the Vashe wound cleanser.  Recommend that he use it daily followed by bordered Mepilex dressing.  Keep it clean with soap and water.  Return in 3 to 4 weeks, sooner if needed.  Picture(s) obtained of the patient and placed in the chart were with the patient's or guardian's permission.   Patient's vital signs for today's encounter are as follows: BP (!) 82/50 (BP Location: Right Arm, Patient Position: Sitting, Cuff Size: Small) Comment: dialysis day  Patient had dialysis earlier today and his systolic is typically between 80 to 100 mmHg.  He denies any fatigue  or lightheadedness and is ambulating without difficulty.  This is relatively consistent with his previous encounters.  Denies any chest pain or difficulty breathing.

## 2023-05-20 ENCOUNTER — Ambulatory Visit (INDEPENDENT_AMBULATORY_CARE_PROVIDER_SITE_OTHER): Payer: Medicare Other | Admitting: Physician Assistant

## 2023-05-20 VITALS — BP 82/50

## 2023-05-20 DIAGNOSIS — Z9889 Other specified postprocedural states: Secondary | ICD-10-CM

## 2023-05-20 DIAGNOSIS — S81002D Unspecified open wound, left knee, subsequent encounter: Secondary | ICD-10-CM

## 2023-05-20 MED ORDER — SANTYL 250 UNIT/GM EX OINT: 1.0000 | TOPICAL_OINTMENT | Freq: Every day | CUTANEOUS | 0 refills | Status: DC

## 2023-05-20 MED ORDER — SANTYL 250 UNIT/GM EX OINT: 1.0000 | TOPICAL_OINTMENT | Freq: Every day | CUTANEOUS | 0 refills | Status: AC

## 2023-05-23 ENCOUNTER — Encounter: Payer: Medicare Other | Admitting: Plastic Surgery

## 2023-05-26 ENCOUNTER — Encounter: Payer: Medicare Other | Admitting: Plastic Surgery

## 2023-06-23 ENCOUNTER — Ambulatory Visit (INDEPENDENT_AMBULATORY_CARE_PROVIDER_SITE_OTHER): Payer: Medicare Other | Admitting: Student

## 2023-06-23 VITALS — BP 100/63 | HR 106

## 2023-06-23 DIAGNOSIS — S91002D Unspecified open wound, left ankle, subsequent encounter: Secondary | ICD-10-CM

## 2023-06-23 DIAGNOSIS — S81002D Unspecified open wound, left knee, subsequent encounter: Secondary | ICD-10-CM

## 2023-06-23 DIAGNOSIS — S81802D Unspecified open wound, left lower leg, subsequent encounter: Secondary | ICD-10-CM

## 2023-06-23 DIAGNOSIS — S96902D Unspecified injury of unspecified muscle and tendon at ankle and foot level, left foot, subsequent encounter: Secondary | ICD-10-CM

## 2023-06-23 DIAGNOSIS — S86902D Unspecified injury of unspecified muscle(s) and tendon(s) at lower leg level, left leg, subsequent encounter: Secondary | ICD-10-CM | POA: Diagnosis not present

## 2023-06-23 NOTE — Progress Notes (Signed)
   [place] [address] [place],  [place] [address]   CC:  Chief Complaint  Patient presents with   Post-op Follow-up      [name] is an 57 y.o. male.  HPI: Patient is a 57 year old male with history of a left ankle wound status post STSG placement on 03/22/2023.  He presents to the clinic for further follow-up today.  Patient was last seen in the clinic on 05/20/2023.  At this visit, patient reported he was doing okay.  He reported he was back at work and ambulating regularly.  He did report that he was undergoing a bypass later in the month.  On exam, the left ankle wound measured 4 x 3 cm, slough at the base.  There was some protruding granular tissue.  The surrounding skin appears to be well-healed.  There is no surrounding erythema or swelling.  No malodor.  The STSG harvest site appeared to be well-healed.  Plan was for patient to apply Santyl daily to help with the slough.  Plan is for patient to also return in 3 to 4 weeks.  Today, patient reports he is doing well.  He denies any issues with the wound.  He states he has been applying Vaseline to the wound daily.  He reports he has been ambulating at work without issue.  He does report he has a little bit of pain just distal of the wound.  He states that it feels like it spasms from time to time.  Of note, patient states that he has upcoming stent placement with cardiology on the 22nd.  Review of Systems General: No changes in health  Physical Exam    06/23/2023    8:42 AM 05/20/2023    3:18 PM 04/14/2023    8:43 AM  Vitals with BMI  Systolic 100 82 73  Diastolic 63 50 49  Pulse 106  098    General:  No acute distress,  Alert and oriented, Non-Toxic, Normal speech and affect On exam, patient is sitting upright in no acute distress.  Wound is approximately 3 cm x 2 cm to left ankle.  There is some slough/skin to the wound.  Some dry skin surrounding the wound.  No drainage noted on exam.  No  tenderness to palpation.  No signs of infection.  Assessment/Plan  Open wound knee/leg with tendon involvment, left, subsequent encounter   I discussed with the patient I would like him to continue Vaseline daily to the wound.  Discussed with him he should cover the wound if he is wearing socks or footwear that would rub against the wound.  I also recommended he apply Vaseline or nonfragrant moisturizer such as Cetaphil or CeraVe to the surrounding skin.  Patient expressed understanding.  In terms of the pain, I recommended physical therapy to the patient.  He states that he wants to hold off for now and discuss send his next visit.  Encouraged him to continue light range of motion of the left foot.  Patient expressed understanding.  Patient to follow back up in 1 month.  I instructed the patient to call in the meantime if he has any questions or concerns.  Pictures were obtained of the patient and placed in the chart with the patient's or guardian's permission.   Laurena Spies 06/23/2023, 8:43 AM

## 2023-06-30 ENCOUNTER — Encounter: Payer: Medicare Other | Admitting: Plastic Surgery

## 2023-07-21 ENCOUNTER — Ambulatory Visit (INDEPENDENT_AMBULATORY_CARE_PROVIDER_SITE_OTHER): Payer: Medicare Other | Admitting: Plastic Surgery

## 2023-07-21 VITALS — BP 106/71 | HR 74

## 2023-07-21 DIAGNOSIS — S81802D Unspecified open wound, left lower leg, subsequent encounter: Secondary | ICD-10-CM

## 2023-07-21 NOTE — Progress Notes (Signed)
Mr. Braff returns today after management of an open wound on the anterior aspect of his left leg.  He is doing well and is returned to work.  He has undergone cardiac stent placement after finding lesions on a cardiac cath for his transplant workup.  He is now on Plavix.  The tendons are completely healed and the wound is covered.  I have encouraged him to continue moisturizing the area and to wear a bandage over it if he has shoes which rub on the anterior portion of his leg.  I have offered him physical therapy but he has declined at this time because he is undergoing cardiac rehabilitation.  He did state that that is part of his cardiac rehab he was able to walk full quarter mile around a track last night without difficulty.  He will follow-up with me as needed.

## 2023-08-12 ENCOUNTER — Encounter (HOSPITAL_COMMUNITY): Payer: Self-pay

## 2023-08-17 ENCOUNTER — Encounter (HOSPITAL_COMMUNITY): Admission: RE | Admit: 2023-08-17 | Payer: Medicare Other | Source: Ambulatory Visit

## 2023-08-22 ENCOUNTER — Encounter (HOSPITAL_COMMUNITY)
Admission: RE | Admit: 2023-08-22 | Discharge: 2023-08-22 | Disposition: A | Discharge status: Home / Self Care | Payer: Medicare Other | Source: Ambulatory Visit | Attending: Cardiology | Admitting: Cardiology

## 2023-08-22 DIAGNOSIS — Z955 Presence of coronary angioplasty implant and graft: Secondary | ICD-10-CM | POA: Insufficient documentation

## 2023-08-25 ENCOUNTER — Encounter (HOSPITAL_COMMUNITY)
Admission: RE | Admit: 2023-08-25 | Discharge: 2023-08-25 | Disposition: A | Discharge status: Home / Self Care | Payer: Medicare Other | Source: Ambulatory Visit | Attending: Cardiology | Admitting: Cardiology

## 2023-08-25 VITALS — Ht 72.0 in | Wt 208.8 lb

## 2023-08-25 DIAGNOSIS — Z955 Presence of coronary angioplasty implant and graft: Secondary | ICD-10-CM | POA: Diagnosis present

## 2023-08-25 NOTE — Patient Instructions (Signed)
Patient Instructions  Patient Details  Name: [name] MRN: [id number] Date of Birth: 1966-05-15 Referring Provider:  Odis Luster, MD  Below are your personal goals for exercise, nutrition, and risk factors. Our goal is to help you stay on track towards obtaining and maintaining these goals. We will be discussing your progress on these goals with you throughout the program.  Initial Exercise Prescription:  Initial Exercise Prescription - 08/25/23 0900       Date of Initial Exercise RX and Referring Provider   Date 08/25/23    Referring Provider Odis Luster MD      Oxygen   Maintain Oxygen Saturation 88% or higher      Treadmill   MPH 1.7    Grade 0    Minutes 15    METs 1.8      REL-XR   Level 2    Speed 60    Minutes 15    METs 2      Prescription Details   Frequency (times per week) 2    Duration Progress to 30 minutes of continuous aerobic without signs/symptoms of physical distress      Intensity   THRR 40-80% of Max Heartrate 127-152    Ratings of Perceived Exertion 11-13    Perceived Dyspnea 0-4      Resistance Training   Training Prescription Yes    Weight 4    Reps 10-15             Exercise Goals: Frequency: Be able to perform aerobic exercise two to three times per week in program working toward 2-5 days per week of home exercise.  Intensity: Work with a perceived exertion of 11 (fairly light) - 15 (hard) while following your exercise prescription.  We will make changes to your prescription with you as you progress through the program.   Duration: Be able to do 30 to 45 minutes of continuous aerobic exercise in addition to a 5 minute warm-up and a 5 minute cool-down routine.   Nutrition Goals: Your personal nutrition goals will be established when you do your nutrition analysis with the dietician.  The following are general nutrition guidelines to follow: Cholesterol < 200mg /day Sodium < 1500mg /day Fiber: Men over 50 yrs - 30  grams per day  Personal Goals:  Personal Goals and Risk Factors at Admission - 08/22/23 1017       Core Components/Risk Factors/Patient Goals on Admission    Weight Management Obesity    Hypertension Yes    Intervention Provide education on lifestyle modifcations including regular physical activity/exercise, weight management, moderate sodium restriction and increased consumption of fresh fruit, vegetables, and low fat dairy, alcohol moderation, and smoking cessation.;Monitor prescription use compliance.    Expected Outcomes Short Term: Continued assessment and intervention until BP is < 140/46mm HG in hypertensive participants. < 130/91mm HG in hypertensive participants with diabetes, heart failure or chronic kidney disease.;Long Term: Maintenance of blood pressure at goal levels.    Lipids Yes    Intervention Provide education and support for participant on nutrition & aerobic/resistive exercise along with prescribed medications to achieve LDL 70mg , HDL >40mg .    Expected Outcomes Short Term: Participant states understanding of desired cholesterol values and is compliant with medications prescribed. Participant is following exercise prescription and nutrition guidelines.;Long Term: Cholesterol controlled with medications as prescribed, with individualized exercise RX and with personalized nutrition plan. Value goals: LDL < 70mg , HDL > 40 mg.  Tobacco Use Initial Evaluation: Social History   Tobacco Use  Smoking Status Never  Smokeless Tobacco Never    Exercise Goals and Review:  Exercise Goals     Row Name 08/25/23 0942             Exercise Goals   Increase Physical Activity Yes       Intervention Provide advice, education, support and counseling about physical activity/exercise needs.;Develop an individualized exercise prescription for aerobic and resistive training based on initial evaluation findings, risk stratification, comorbidities and participant's  personal goals.       Expected Outcomes Short Term: Attend rehab on a regular basis to increase amount of physical activity.;Long Term: Add in home exercise to make exercise part of routine and to increase amount of physical activity.;Long Term: Exercising regularly at least 3-5 days a week.       Increase Strength and Stamina Yes       Intervention Provide advice, education, support and counseling about physical activity/exercise needs.;Develop an individualized exercise prescription for aerobic and resistive training based on initial evaluation findings, risk stratification, comorbidities and participant's personal goals.       Expected Outcomes Short Term: Increase workloads from initial exercise prescription for resistance, speed, and METs.;Short Term: Perform resistance training exercises routinely during rehab and add in resistance training at home;Long Term: Improve cardiorespiratory fitness, muscular endurance and strength as measured by increased METs and functional capacity ( )       Able to understand and use rate of perceived exertion (RPE) scale Yes       Intervention Provide education and explanation on how to use RPE scale       Expected Outcomes Short Term: Able to use RPE daily in rehab to express subjective intensity level;Long Term:  Able to use RPE to guide intensity level when exercising independently       Able to understand and use Dyspnea scale Yes       Intervention Provide education and explanation on how to use Dyspnea scale       Expected Outcomes Short Term: Able to use Dyspnea scale daily in rehab to express subjective sense of shortness of breath during exertion;Long Term: Able to use Dyspnea scale to guide intensity level when exercising independently       Knowledge and understanding of Target Heart Rate Range (THRR) Yes       Intervention Provide education and explanation of THRR including how the numbers were predicted and where they are located for reference        Expected Outcomes Short Term: Able to state/look up THRR;Long Term: Able to use THRR to govern intensity when exercising independently;Short Term: Able to use daily as guideline for intensity in rehab       Able to check pulse independently Yes       Intervention Provide education and demonstration on how to check pulse in carotid and radial arteries.;Review the importance of being able to check your own pulse for safety during independent exercise       Expected Outcomes Short Term: Able to explain why pulse checking is important during independent exercise;Long Term: Able to check pulse independently and accurately       Understanding of Exercise Prescription Yes       Intervention Provide education, explanation, and written materials on patient's individual exercise prescription       Expected Outcomes Short Term: Able to explain program exercise prescription;Long Term: Able to explain home exercise prescription to exercise independently  Copy of goals given to participant.
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
## 2023-08-30 ENCOUNTER — Encounter (HOSPITAL_COMMUNITY)
Admission: RE | Admit: 2023-08-30 | Discharge: 2023-08-30 | Disposition: A | Discharge status: Home / Self Care | Payer: Medicare Other | Source: Ambulatory Visit | Attending: Cardiology | Admitting: Cardiology

## 2023-08-30 DIAGNOSIS — Z955 Presence of coronary angioplasty implant and graft: Secondary | ICD-10-CM | POA: Diagnosis not present

## 2023-08-30 NOTE — Progress Notes (Signed)
Daily Session Note  Patient Details  Name: [name] MRN: [id number] Date of Birth: 07/25/1966 Referring Provider:   Flowsheet Row CARDIAC REHAB PHASE II ORIENTATION from 08/25/2023 in [facility] CARDIAC REHABILITATION  Referring Provider Odis Luster MD       Encounter Date: 08/30/2023  Check In:  Session Check In - 08/30/23 1030       Check-In   Supervising physician immediately available to respond to emergencies See telemetry face sheet for immediately available MD    Location [facility]    Staff Present Ross Ludwig, BS, Exercise Physiologist;Jessica Juanetta Gosling, MA, RCEP, CCRP, CCET;Other    Virtual Visit No    Medication changes reported     No    Fall or balance concerns reported    No    Tobacco Cessation No Change    Warm-up and Cool-down Performed on first and last piece of equipment    Resistance Training Performed Yes    VAD Patient? No    PAD/SET Patient? No      Pain Assessment   Currently in Pain? No/denies    Multiple Pain Sites No             Capillary Blood Glucose: No results found for this or any previous visit (from the past 24 hour(s)).    Social History   Tobacco Use  Smoking Status Never  Smokeless Tobacco Never    Goals Met:  Independence with exercise equipment Exercise tolerated well No report of concerns or symptoms today Strength training completed today  Goals Unmet:  Not Applicable  Comments: First full day of exercise!  Patient was oriented to gym and equipment including functions, settings, policies, and procedures.  Patient's individual exercise prescription and treatment plan were reviewed.  All starting workloads were established based on the results of the 6 minute walk test done at initial orientation visit.  The plan for exercise progression was also introduced and progression will be customized based on patient's performance and goals.

## 2023-09-01 ENCOUNTER — Encounter (HOSPITAL_COMMUNITY)
Admission: RE | Admit: 2023-09-01 | Discharge: 2023-09-01 | Disposition: A | Discharge status: Home / Self Care | Payer: Medicare Other | Source: Ambulatory Visit | Attending: Cardiology | Admitting: Cardiology

## 2023-09-01 DIAGNOSIS — Z955 Presence of coronary angioplasty implant and graft: Secondary | ICD-10-CM | POA: Diagnosis not present

## 2023-09-01 NOTE — Progress Notes (Signed)
Daily Session Note  Patient Details  Name: [name] MRN: [id number] Date of Birth: 12/28/1965 Referring Provider:   Flowsheet Row CARDIAC REHAB PHASE II ORIENTATION from 08/25/2023 in [facility] CARDIAC REHABILITATION  Referring Provider Odis Luster MD       Encounter Date: 09/01/2023  Check In:  Session Check In - 09/01/23 1030       Check-In   Supervising physician immediately available to respond to emergencies See telemetry face sheet for immediately available MD    Location [facility]    Staff Present Ross Ludwig, BS, Exercise Physiologist;Other    Virtual Visit No    Medication changes reported     No    Fall or balance concerns reported    No    Tobacco Cessation No Change    Warm-up and Cool-down Performed on first and last piece of equipment    Resistance Training Performed Yes    VAD Patient? No    PAD/SET Patient? No      Pain Assessment   Currently in Pain? No/denies    Multiple Pain Sites No             Capillary Blood Glucose: No results found for this or any previous visit (from the past 24 hour(s)).    Social History   Tobacco Use  Smoking Status Never  Smokeless Tobacco Never    Goals Met:  Independence with exercise equipment Exercise tolerated well No report of concerns or symptoms today Strength training completed today  Goals Unmet:  Not Applicable  Comments: Pt able to follow exercise prescription today without complaint.  Will continue to monitor for progression.

## 2023-09-06 ENCOUNTER — Encounter (HOSPITAL_COMMUNITY)
Admission: RE | Admit: 2023-09-06 | Discharge: 2023-09-06 | Disposition: A | Discharge status: Home / Self Care | Payer: Medicare Other | Source: Ambulatory Visit | Attending: Cardiology | Admitting: Cardiology

## 2023-09-06 DIAGNOSIS — Z955 Presence of coronary angioplasty implant and graft: Secondary | ICD-10-CM

## 2023-09-06 NOTE — Progress Notes (Signed)
Daily Session Note  Patient Details  Name: [name] MRN: [id number] Date of Birth: [birth year] Referring Provider:   Flowsheet Row CARDIAC REHAB PHASE II ORIENTATION from 08/25/2023 in [facility] CARDIAC REHABILITATION  Referring Provider Odis Luster MD       Encounter Date: 09/06/2023  Check In:  Session Check In - 09/06/23 1030       Check-In   Supervising physician immediately available to respond to emergencies See telemetry face sheet for immediately available MD    Location [facility]    Staff Present Ross Ludwig, BS, Exercise Physiologist;Menno Vanbergen, RN;Jessica [place], MA, RCEP, CCRP, CCET    Virtual Visit No    Medication changes reported     No    Fall or balance concerns reported    No    Tobacco Cessation No Change    Warm-up and Cool-down Performed on first and last piece of equipment    Resistance Training Performed Yes    VAD Patient? No    PAD/SET Patient? No      Pain Assessment   Currently in Pain? No/denies    Multiple Pain Sites No             Capillary Blood Glucose: No results found for this or any previous visit (from the past 24 hour(s)).    Social History   Tobacco Use  Smoking Status Never  Smokeless Tobacco Never    Goals Met:  Independence with exercise equipment Exercise tolerated well No report of concerns or symptoms today Strength training completed today  Goals Unmet:  Not Applicable  Comments: Pt able to follow exercise prescription today without complaint.  Will continue to monitor for progression.

## 2023-09-08 ENCOUNTER — Encounter (HOSPITAL_COMMUNITY)
Admission: RE | Admit: 2023-09-08 | Discharge: 2023-09-08 | Disposition: A | Discharge status: Home / Self Care | Payer: Medicare Other | Source: Ambulatory Visit | Attending: Cardiology | Admitting: Cardiology

## 2023-09-08 DIAGNOSIS — Z955 Presence of coronary angioplasty implant and graft: Secondary | ICD-10-CM

## 2023-09-08 NOTE — Progress Notes (Signed)
Daily Session Note  Patient Details  Name: [name] MRN: [id number] Date of Birth: 1966/01/08 Referring Provider:   Flowsheet Row CARDIAC REHAB PHASE II ORIENTATION from 08/25/2023 in [facility] CARDIAC REHABILITATION  Referring Provider Odis Luster MD       Encounter Date: 09/08/2023  Check In:  Session Check In - 09/08/23 1015       Check-In   Supervising physician immediately available to respond to emergencies See telemetry face sheet for immediately available ER MD    Location [facility]    Staff Present Rodena Medin, RN, BSN;Heather Gaynell Face, Exercise Physiologist;Hillary Leonidas Romberg BSN, RN    Virtual Visit No    Medication changes reported     No    Fall or balance concerns reported    No    Warm-up and Cool-down Performed on first and last piece of equipment    Resistance Training Performed Yes    VAD Patient? No    PAD/SET Patient? No      Pain Assessment   Currently in Pain? No/denies    Multiple Pain Sites No             Capillary Blood Glucose: No results found for this or any previous visit (from the past 24 hour(s)).    Social History   Tobacco Use  Smoking Status Never  Smokeless Tobacco Never    Goals Met:  Independence with exercise equipment Exercise tolerated well No report of concerns or symptoms today Strength training completed today  Goals Unmet:  Not Applicable  Comments: Pt able to follow exercise prescription today without complaint.  Will continue to monitor for progression.

## 2023-09-13 ENCOUNTER — Encounter (HOSPITAL_COMMUNITY)
Admission: RE | Admit: 2023-09-13 | Discharge: 2023-09-13 | Disposition: A | Discharge status: Home / Self Care | Payer: Medicare Other | Source: Ambulatory Visit | Attending: Cardiology | Admitting: Cardiology

## 2023-09-13 DIAGNOSIS — Z955 Presence of coronary angioplasty implant and graft: Secondary | ICD-10-CM | POA: Diagnosis not present

## 2023-09-13 NOTE — Progress Notes (Signed)
Daily Session Note  Patient Details  Name: [name] MRN: [id number] Date of Birth: 1966-07-29 Referring Provider:   Flowsheet Row CARDIAC REHAB PHASE II ORIENTATION from 08/25/2023 in [facility] CARDIAC REHABILITATION  Referring Provider Odis Luster MD       Encounter Date: 09/13/2023  Check In:  Session Check In - 09/13/23 1030       Check-In   Supervising physician immediately available to respond to emergencies See telemetry face sheet for immediately available MD    Location [facility]    Staff Present Ross Ludwig, BS, Exercise Physiologist;Jessica Juanetta Gosling, MA, RCEP, CCRP, CCET    Virtual Visit No    Medication changes reported     No    Fall or balance concerns reported    No    Warm-up and Cool-down Performed on first and last piece of equipment    Resistance Training Performed Yes    VAD Patient? No    PAD/SET Patient? No      Pain Assessment   Currently in Pain? No/denies    Multiple Pain Sites No             Capillary Blood Glucose: No results found for this or any previous visit (from the past 24 hour(s)).    Social History   Tobacco Use  Smoking Status Never  Smokeless Tobacco Never    Goals Met:  Independence with exercise equipment Exercise tolerated well No report of concerns or symptoms today Strength training completed today  Goals Unmet:  Not Applicable  Comments: Pt able to follow exercise prescription today without complaint.  Will continue to monitor for progression.

## 2023-09-15 ENCOUNTER — Encounter (HOSPITAL_COMMUNITY)
Admission: RE | Admit: 2023-09-15 | Discharge: 2023-09-15 | Disposition: A | Discharge status: Home / Self Care | Payer: Medicare Other | Source: Ambulatory Visit | Attending: Cardiology | Admitting: Cardiology

## 2023-09-15 DIAGNOSIS — Z955 Presence of coronary angioplasty implant and graft: Secondary | ICD-10-CM | POA: Diagnosis not present

## 2023-09-15 NOTE — Progress Notes (Signed)
Daily Session Note  Patient Details  Name: [name] MRN: [id number] Date of Birth: [birth year] Referring Provider:   Flowsheet Row CARDIAC REHAB PHASE II ORIENTATION from 08/25/2023 in [facility] CARDIAC REHABILITATION  Referring Provider Odis Luster MD       Encounter Date: 09/15/2023  Check In:  Session Check In - 09/15/23 1030       Check-In   Supervising physician immediately available to respond to emergencies See telemetry face sheet for immediately available MD    Location [facility]    Staff Present Ross Ludwig, BS, Exercise Physiologist;Debra Laural Benes, RN, BSN    Virtual Visit No    Medication changes reported     Yes    Comments allopurinol discontnued    Fall or balance concerns reported    No    Tobacco Cessation No Change    Warm-up and Cool-down Performed on first and last piece of equipment    Resistance Training Performed Yes    VAD Patient? No    PAD/SET Patient? No      Pain Assessment   Currently in Pain? No/denies    Multiple Pain Sites No             Capillary Blood Glucose: No results found for this or any previous visit (from the past 24 hour(s)).    Social History   Tobacco Use  Smoking Status Never  Smokeless Tobacco Never    Goals Met:  Independence with exercise equipment Exercise tolerated well No report of concerns or symptoms today Strength training completed today  Goals Unmet:  Not Applicable  Comments: Pt able to follow exercise prescription today without complaint.  Will continue to monitor for progression.

## 2023-09-20 ENCOUNTER — Encounter (HOSPITAL_COMMUNITY)
Admission: RE | Admit: 2023-09-20 | Discharge: 2023-09-20 | Disposition: A | Discharge status: Home / Self Care | Payer: Medicare Other | Source: Ambulatory Visit | Attending: Cardiology | Admitting: Cardiology

## 2023-09-20 DIAGNOSIS — Z955 Presence of coronary angioplasty implant and graft: Secondary | ICD-10-CM | POA: Insufficient documentation

## 2023-09-20 NOTE — Progress Notes (Signed)
Daily Session Note  Patient Details  Name: Cleland Simkins MRN: [id number] Date of Birth: [birth year] Referring Provider:   Flowsheet Row CARDIAC REHAB PHASE II ORIENTATION from 08/25/2023 in [facility] CARDIAC REHABILITATION  Referring Provider Odis Luster MD       Encounter Date: 09/20/2023  Check In:  Session Check In - 09/20/23 1141       Check-In   Supervising physician immediately available to respond to emergencies See telemetry face sheet for immediately available MD    Location [facility]    Staff Present Ross Ludwig, BS, Exercise Physiologist;Dominick Zertuche Juanetta Gosling, MA, RCEP, CCRP, Gennie Alma, RN, BSN    Virtual Visit No    Medication changes reported     No    Fall or balance concerns reported    No    Warm-up and Cool-down Performed on first and last piece of equipment    Resistance Training Performed Yes    VAD Patient? No    PAD/SET Patient? No      Pain Assessment   Currently in Pain? No/denies             Capillary Blood Glucose: No results found for this or any previous visit (from the past 24 hour(s)).    Social History   Tobacco Use  Smoking Status Never  Smokeless Tobacco Never    Goals Met:  Independence with exercise equipment Exercise tolerated well No report of concerns or symptoms today Strength training completed today  Goals Unmet:  Not Applicable  Comments: Pt able to follow exercise prescription today without complaint.  Will continue to monitor for progression.

## 2023-09-21 ENCOUNTER — Encounter (HOSPITAL_COMMUNITY): Payer: Self-pay | Admitting: *Deleted

## 2023-09-21 DIAGNOSIS — Z955 Presence of coronary angioplasty implant and graft: Secondary | ICD-10-CM

## 2023-09-21 NOTE — Progress Notes (Signed)
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 and the definition of COPD.   Nutrition I: Fats -Discuss the types of cholesterol, what cholesterol does to the heart, and how cholesterol levels can be controlled. Flowsheet Row CARDIAC REHAB PHASE II EXERCISE from 09/15/2023 in [place] CARDIAC REHABILITATION  Date 09/01/23  Educator jh  Instruction Review Code 1- Verbalizes Understanding       Nutrition II: Labels -Discuss the different components of food labels and how to read food label   Heart Parts/Heart Disease and PAD -Discuss the anatomy of the heart, the pathway of blood circulation through the heart, and these are affected by heart disease.   Stress I: Signs and Symptoms -Discuss the causes of stress, how stress may lead to anxiety and depression, and ways to limit stress. Flowsheet Row  CARDIAC REHAB PHASE II EXERCISE from 09/15/2023 in [place] CARDIAC REHABILITATION  Date 09/08/23  Educator HB  Instruction Review Code 1- Verbalizes Understanding       Stress II: Relaxation -Discuss different types of relaxation techniques to limit stress.   Warning Signs of Stroke / TIA -Discuss definition of a stroke, what the signs and symptoms are of a stroke, and how to identify when someone is having stroke.   Knowledge Questionnaire Score:  Knowledge Questionnaire Score - 08/22/23 1020       Knowledge Questionnaire Score   Pre Score 22/26             Core Components/Risk Factors/Patient Goals at Admission:  Personal Goals and Risk Factors at Admission - 08/22/23 1017       Core Components/Risk Factors/Patient Goals on Admission    Weight Management Obesity    Hypertension Yes    Intervention Provide education on lifestyle modifcations including regular physical activity/exercise, weight management, moderate sodium restriction and increased consumption of fresh fruit, vegetables, and low fat dairy, alcohol moderation, and smoking cessation.;Monitor prescription use compliance.    Expected Outcomes Short Term: Continued assessment and intervention until BP is < 140/20mm HG in hypertensive participants. < 130/59mm HG in hypertensive participants with diabetes, heart failure or chronic kidney disease.;Long Term: Maintenance of blood pressure at goal levels.    Lipids Yes    Intervention Provide education and support for participant on nutrition & aerobic/resistive exercise along with prescribed medications to achieve LDL 70mg , HDL >40mg .    Expected Outcomes Short Term: Participant states understanding of desired cholesterol values and is compliant with medications prescribed. Participant is following exercise prescription and nutrition guidelines.;Long Term: Cholesterol controlled with medications as prescribed, with individualized exercise RX and with personalized  nutrition plan. Value goals: LDL < 70mg , HDL > 40 mg.             Core Components/Risk Factors/Patient Goals Review:   Goals and Risk Factor Review     Row Name 09/14/23 1307             Core Components/Risk Factors/Patient Goals Review   Personal Goals Review Weight Management/Obesity;Hypertension       Review Trey Paula is doing well in rehab.  His weight is staying steady at 93.5 kg and he continues to keep a close eye on it at dialysis as well. He has  also been watching his blood pressures.  They have been good in class and at dialysis.       Expected Outcomes short: Continue to keep eye on blood pressure Long: Continue to monitor risk factors                Core Components/Risk Factors/Patient Goals at Discharge (Final Review):   Goals and Risk Factor Review - 09/14/23 1307       Core Components/Risk Factors/Patient Goals Review   Personal Goals Review Weight Management/Obesity;Hypertension    Review Trey Paula is doing well in rehab.  His weight is staying steady at 93.5 kg and he continues to keep a close eye on it at dialysis as well. He has also been watching his blood pressures.  They have been good in class and at dialysis.    Expected Outcomes short: Continue to keep eye on blood pressure Long: Continue to monitor risk factors             ITP Comments:  ITP Comments     Row Name 08/25/23 0927 08/30/23 1047 09/21/23 0808       ITP Comments Patient arrived for 1st visit/orientation/education at 0830. Patient was referred to CR by Dr. Odis Luster due to S/P Stent Placement. During orientation advised patient on arrival and appointment times what to wear, what to do before, during and after exercise. Reviewed attendance and class policy.  Pt is scheduled to return Cardiac Rehab on 08/30/23  at 1030. Pt was advised to come to class 15 minutes before class starts.  Discussed RPE/Dpysnea scales. Patient participated in warm up stretches. Patient was able to complete 6  minute walk test.  Telemetry: AFib. Patient was measured for the equipment. Discussed equipment safety with patient. Took patient pre-anthropometric measurements. Patient finished visit at 1035. First full day of exercise!  Patient was oriented to gym and equipment including functions, settings, policies, and procedures.  Patient's individual exercise prescription and treatment plan were reviewed.  All starting workloads were established based on the results of the 6 minute walk test done at initial orientation visit.  The plan for exercise progression was also introduced and progression will be customized based on patient's performance and goals. 30 day review completed. ITP sent to Dr. Dina Rich, Medical Director of Cardiac Rehab. Continue with ITP unless changes are made by physician.              Comments: 30 day review

## 2023-09-22 ENCOUNTER — Encounter (HOSPITAL_COMMUNITY)
Admission: RE | Admit: 2023-09-22 | Discharge: 2023-09-22 | Disposition: A | Discharge status: Home / Self Care | Payer: Medicare Other | Source: Ambulatory Visit | Attending: Cardiology | Admitting: Cardiology

## 2023-09-22 DIAGNOSIS — Z955 Presence of coronary angioplasty implant and graft: Secondary | ICD-10-CM

## 2023-09-22 NOTE — Progress Notes (Signed)
Daily Session Note  Patient Details  Name: Alexzavier Girardin MRN: [id number] Date of Birth: August 15, 1966 Referring Provider:   Flowsheet Row CARDIAC REHAB PHASE II ORIENTATION from 08/25/2023 in [facility] CARDIAC REHABILITATION  Referring Provider Odis Luster MD       Encounter Date: 09/22/2023  Check In:  Session Check In - 09/22/23 1202       Check-In   Supervising physician immediately available to respond to emergencies See telemetry face sheet for immediately available MD    Location [facility]    Staff Present Ross Ludwig, BS, Exercise Physiologist;Other   Bess Kinds, RN BSN   Virtual Visit No    Medication changes reported     No    Fall or balance concerns reported    No    Tobacco Cessation No Change    Warm-up and Cool-down Performed on first and last piece of equipment    Resistance Training Performed Yes    VAD Patient? No    PAD/SET Patient? No      Pain Assessment   Currently in Pain? No/denies             Capillary Blood Glucose: No results found for this or any previous visit (from the past 24 hour(s)).    Social History   Tobacco Use  Smoking Status Never  Smokeless Tobacco Never    Goals Met:  Independence with exercise equipment Exercise tolerated well No report of concerns or symptoms today Strength training completed today  Goals Unmet:  Not Applicable  Comments: Pt able to follow exercise prescription today without complaint.  Will continue to monitor for progression.

## 2023-09-27 ENCOUNTER — Encounter (HOSPITAL_COMMUNITY)
Admission: RE | Admit: 2023-09-27 | Discharge: 2023-09-27 | Disposition: A | Discharge status: Home / Self Care | Payer: Medicare Other | Source: Ambulatory Visit | Attending: Cardiology | Admitting: Cardiology

## 2023-09-27 DIAGNOSIS — Z955 Presence of coronary angioplasty implant and graft: Secondary | ICD-10-CM

## 2023-09-27 NOTE — Progress Notes (Signed)
Daily Session Note  Patient Details  Name: [name] MRN: [id number] Date of Birth: 1966/01/15 Referring Provider:   Flowsheet Row CARDIAC REHAB PHASE II ORIENTATION from 08/25/2023 in [facility] CARDIAC REHABILITATION  Referring Provider Odis Luster MD       Encounter Date: 09/27/2023  Check In:  Session Check In - 09/27/23 1638       Check-In   Supervising physician immediately available to respond to emergencies See telemetry face sheet for immediately available MD    Location [facility]    Staff Present Erskine Speed, RN;Christy Randa Evens, RN, BSN    Staff Present Hulen Luster, BS, RRT, CPFT    Virtual Visit No    Medication changes reported     No    Warm-up and Cool-down Performed on first and last piece of equipment    Resistance Training Performed Yes    VAD Patient? No    PAD/SET Patient? No      Pain Assessment   Currently in Pain? No/denies             Capillary Blood Glucose: No results found for this or any previous visit (from the past 24 hour(s)).    Social History   Tobacco Use  Smoking Status Never  Smokeless Tobacco Never    Goals Met:  Exercise tolerated well No report of concerns or symptoms today Strength training completed today  Goals Unmet:  Not Applicable  Comments: Pt able to follow exercise prescription today without complaint.  Will continue to monitor for progression.

## 2023-09-29 ENCOUNTER — Encounter (HOSPITAL_COMMUNITY)
Admission: RE | Admit: 2023-09-29 | Discharge: 2023-09-29 | Disposition: A | Discharge status: Home / Self Care | Payer: Medicare Other | Source: Ambulatory Visit | Attending: Cardiology | Admitting: Cardiology

## 2023-09-29 DIAGNOSIS — Z955 Presence of coronary angioplasty implant and graft: Secondary | ICD-10-CM

## 2023-09-29 NOTE — Progress Notes (Signed)
Daily Session Note  Patient Details  Name: Caliber Omana MRN: [id number] Date of Birth: 04-Aug-1966 Referring Provider:   Flowsheet Row CARDIAC REHAB PHASE II ORIENTATION from 08/25/2023 in [facility] CARDIAC REHABILITATION  Referring Provider Odis Luster MD       Encounter Date: 09/29/2023  Check In:  Session Check In - 09/29/23 1015       Check-In   Supervising physician immediately available to respond to emergencies See telemetry face sheet for immediately available ER MD    Location [facility]    Staff Present Ross Ludwig, BS, Exercise Physiologist;Danny Gala Romney, RN, Neal Dy, RN, BSN    Virtual Visit No    Medication changes reported     No    Fall or balance concerns reported    No    Warm-up and Cool-down Performed on first and last piece of equipment    Resistance Training Performed Yes    VAD Patient? No    PAD/SET Patient? No      Pain Assessment   Currently in Pain? No/denies    Multiple Pain Sites No             Capillary Blood Glucose: No results found for this or any previous visit (from the past 24 hour(s)).    Social History   Tobacco Use  Smoking Status Never  Smokeless Tobacco Never    Goals Met:  Independence with exercise equipment Exercise tolerated well No report of concerns or symptoms today Strength training completed today  Goals Unmet:  Not Applicable  Comments: Pt able to follow exercise prescription today without complaint.  Will continue to monitor for progression.

## 2023-10-04 ENCOUNTER — Encounter (HOSPITAL_COMMUNITY)
Admission: RE | Admit: 2023-10-04 | Discharge: 2023-10-04 | Disposition: A | Discharge status: Home / Self Care | Payer: Medicare Other | Source: Ambulatory Visit | Attending: Cardiology | Admitting: Cardiology

## 2023-10-04 DIAGNOSIS — Z955 Presence of coronary angioplasty implant and graft: Secondary | ICD-10-CM

## 2023-10-04 NOTE — Progress Notes (Signed)
Daily Session Note  Patient Details  Name: [name] MRN: [id number] Date of Birth: June 28, 1966 Referring Provider:   Flowsheet Row CARDIAC REHAB PHASE II ORIENTATION from 08/25/2023 in [facility] CARDIAC REHABILITATION  Referring Provider Odis Luster MD       Encounter Date: 10/04/2023  Check In:  Session Check In - 10/04/23 1030       Check-In   Supervising physician immediately available to respond to emergencies See telemetry face sheet for immediately available MD    Location [facility]    Staff Present Ross Ludwig, BS, Exercise Physiologist;Jessica Juanetta Gosling, MA, RCEP, CCRP, CCET;Other    Virtual Visit No    Medication changes reported     No    Fall or balance concerns reported    No    Tobacco Cessation No Change    Warm-up and Cool-down Performed on first and last piece of equipment    Resistance Training Performed Yes    VAD Patient? No    PAD/SET Patient? No      Pain Assessment   Currently in Pain? No/denies    Multiple Pain Sites No             Capillary Blood Glucose: No results found for this or any previous visit (from the past 24 hour(s)).    Social History   Tobacco Use  Smoking Status Never  Smokeless Tobacco Never    Goals Met:  Independence with exercise equipment Exercise tolerated well No report of concerns or symptoms today Strength training completed today  Goals Unmet:  Not Applicable  Comments: Pt able to follow exercise prescription today without complaint.  Will continue to monitor for progression.

## 2023-10-06 ENCOUNTER — Encounter (HOSPITAL_COMMUNITY)
Admission: RE | Admit: 2023-10-06 | Discharge: 2023-10-06 | Disposition: A | Discharge status: Home / Self Care | Payer: Medicare Other | Source: Ambulatory Visit | Attending: Cardiology | Admitting: Cardiology

## 2023-10-06 DIAGNOSIS — Z955 Presence of coronary angioplasty implant and graft: Secondary | ICD-10-CM | POA: Diagnosis not present

## 2023-10-06 NOTE — Progress Notes (Signed)
Daily Session Note  Patient Details  Name: [name] MRN: [id number] Date of Birth: 04/12/1966 Referring Provider:   Flowsheet Row CARDIAC REHAB PHASE II ORIENTATION from 08/25/2023 in [facility] CARDIAC REHABILITATION  Referring Provider Odis Luster MD       Encounter Date: 10/06/2023  Check In:  Session Check In - 10/06/23 1030       Check-In   Supervising physician immediately available to respond to emergencies See telemetry face sheet for immediately available MD    Location [facility]    Staff Present Ross Ludwig, BS, Exercise Physiologist;Other    Virtual Visit No    Medication changes reported     No    Fall or balance concerns reported    No    Tobacco Cessation No Change    Warm-up and Cool-down Performed on first and last piece of equipment    Resistance Training Performed Yes    VAD Patient? No    PAD/SET Patient? No      Pain Assessment   Currently in Pain? No/denies    Multiple Pain Sites No             Capillary Blood Glucose: No results found for this or any previous visit (from the past 24 hour(s)).    Social History   Tobacco Use  Smoking Status Never  Smokeless Tobacco Never    Goals Met:  Independence with exercise equipment Exercise tolerated well No report of concerns or symptoms today Strength training completed today  Goals Unmet:  Not Applicable  Comments: Pt able to follow exercise prescription today without complaint.  Will continue to monitor for progression.

## 2023-10-11 ENCOUNTER — Encounter (HOSPITAL_COMMUNITY)
Admission: RE | Admit: 2023-10-11 | Discharge: 2023-10-11 | Disposition: A | Discharge status: Home / Self Care | Payer: Medicare Other | Source: Ambulatory Visit | Attending: Cardiology | Admitting: Cardiology

## 2023-10-11 DIAGNOSIS — Z955 Presence of coronary angioplasty implant and graft: Secondary | ICD-10-CM

## 2023-10-11 NOTE — Progress Notes (Signed)
Daily Session Note  Patient Details  Name: [name] MRN: [id number] Date of Birth: 1966/02/09 Referring Provider:   Flowsheet Row CARDIAC REHAB PHASE II ORIENTATION from 08/25/2023 in [facility] CARDIAC REHABILITATION  Referring Provider Odis Luster MD       Encounter Date: 10/11/2023  Check In:  Session Check In - 10/11/23 1015       Check-In   Supervising physician immediately available to respond to emergencies See telemetry face sheet for immediately available ER MD    Location [facility]    Staff Present Rodena Medin, RN, BSN;Jessica Juanetta Gosling, MA, RCEP, CCRP, CCET;Heather Fredric Mare, [place], Exercise Physiologist    Virtual Visit No    Medication changes reported     No    Fall or balance concerns reported    No    Warm-up and Cool-down Performed on first and last piece of equipment    Resistance Training Performed Yes    VAD Patient? No    PAD/SET Patient? No      Pain Assessment   Currently in Pain? No/denies    Multiple Pain Sites No             Capillary Blood Glucose: No results found for this or any previous visit (from the past 24 hour(s)).    Social History   Tobacco Use  Smoking Status Never  Smokeless Tobacco Never    Goals Met:  Independence with exercise equipment Exercise tolerated well No report of concerns or symptoms today Strength training completed today  Goals Unmet:  Not Applicable  Comments: Pt able to follow exercise prescription today without complaint.  Will continue to monitor for progression.

## 2023-10-18 ENCOUNTER — Encounter (HOSPITAL_COMMUNITY)
Admission: RE | Admit: 2023-10-18 | Discharge: 2023-10-18 | Disposition: A | Discharge status: Home / Self Care | Payer: Medicare Other | Source: Ambulatory Visit | Attending: Cardiology | Admitting: Cardiology

## 2023-10-18 DIAGNOSIS — Z955 Presence of coronary angioplasty implant and graft: Secondary | ICD-10-CM | POA: Diagnosis present

## 2023-10-18 NOTE — Progress Notes (Signed)
Daily Session Note  Patient Details  Name: [name] MRN: [id number] Date of Birth: February 23, 1966 Referring Provider:   Flowsheet Row CARDIAC REHAB PHASE II ORIENTATION from 08/25/2023 in [place] CARDIAC REHABILITATION  Referring Provider Odis Luster MD       Encounter Date: 10/18/2023  Check In:  Session Check In - 10/18/23 1138       Check-In   Supervising physician immediately available to respond to emergencies See telemetry face sheet for immediately available MD    Location [facility]    Staff Present Ross Ludwig, BS, Exercise Physiologist;Jakaree Pickard Juanetta Gosling, MA, RCEP, CCRP, Gennie Alma, RN, BSN    Virtual Visit No    Medication changes reported     No    Fall or balance concerns reported    No    Warm-up and Cool-down Performed on first and last piece of equipment    Resistance Training Performed Yes    VAD Patient? No    PAD/SET Patient? No      Pain Assessment   Currently in Pain? No/denies             Capillary Blood Glucose: No results found for this or any previous visit (from the past 24 hour(s)).    Social History   Tobacco Use  Smoking Status Never  Smokeless Tobacco Never    Goals Met:  Independence with exercise equipment Exercise tolerated well No report of concerns or symptoms today Strength training completed today  Goals Unmet:  Not Applicable  Comments: Pt able to follow exercise prescription today without complaint.  Will continue to monitor for progression.

## 2023-10-19 ENCOUNTER — Encounter (HOSPITAL_COMMUNITY): Payer: Self-pay | Admitting: *Deleted

## 2023-10-19 DIAGNOSIS — Z955 Presence of coronary angioplasty implant and graft: Secondary | ICD-10-CM

## 2023-10-19 NOTE — Progress Notes (Signed)
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 Psychosocial Services  Follow up required by staff             Vocational Rehabilitation: Provide vocational rehab assistance to qualifying candidates.   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             Education: Education Goals: Education classes will be provided on a weekly basis, covering required topics. Participant will state understanding/return demonstration of topics presented.  Learning Barriers/Preferences:  Learning Barriers/Preferences - 08/22/23 1016       Learning Barriers/Preferences   Learning Barriers None    Learning Preferences Skilled Demonstration              Education Topics: Hypertension, Hypertension Reduction -Define heart disease and high blood pressure. Discus how high blood pressure affects the body and ways to reduce high blood pressure.   Exercise and Your Heart -Discuss why it is important to exercise, the FITT principles of exercise, normal and abnormal responses to exercise, and how to exercise safely.   Angina -Discuss definition of angina, causes of angina, treatment of angina, and how to decrease risk of having angina.   Cardiac Medications -Review what the following cardiac medications are used for, how they affect the body, and side effects that may occur when taking the medications.  Medications include Aspirin, Beta blockers, calcium channel blockers, ACE Inhibitors, angiotensin receptor blockers, diuretics, digoxin, and antihyperlipidemics. Flowsheet Row CARDIAC REHAB PHASE II EXERCISE from 10/06/2023 in [place] CARDIAC REHABILITATION  Date 09/22/23  Educator hb  Instruction Review Code 1- Verbalizes Understanding       Congestive Heart Failure -Discuss the definition of CHF, how to live with CHF, the signs and symptoms of CHF, and how keep track of weight and sodium intake. Flowsheet Row CARDIAC REHAB PHASE II EXERCISE from 10/06/2023 in [place] CARDIAC REHABILITATION  Date 09/15/23  Educator hb  Instruction Review Code 1- Verbalizes Understanding       Heart Disease and Intimacy -Discus the effect sexual activity has on the heart, how changes occur during intimacy as we age, and safety during sexual activity.   Smoking Cessation / COPD -Discuss different methods to quit smoking, the health benefits of quitting smoking, and the definition of COPD.   Nutrition I: Fats -Discuss the types of cholesterol, what cholesterol does to the heart, and how cholesterol levels can be controlled. Flowsheet Row CARDIAC REHAB PHASE II EXERCISE from 10/06/2023 in [place] CARDIAC REHABILITATION  Date 09/01/23   Educator jh  Instruction Review Code 1- Verbalizes Understanding       Nutrition II: Labels -Discuss the different components of food labels and how to read food label   Heart Parts/Heart Disease and PAD -Discuss the anatomy of the heart, the pathway of blood circulation through the heart, and these are affected by heart disease. Flowsheet Row CARDIAC REHAB PHASE II EXERCISE from 10/06/2023 in [place] CARDIAC REHABILITATION  Date 10/06/23  Educator hb  Instruction Review Code 1- Verbalizes Understanding       Stress I: Signs and Symptoms -Discuss the causes of stress, how stress may lead to anxiety and depression, and ways to limit stress. Flowsheet Row CARDIAC REHAB PHASE II EXERCISE from  10/06/2023 in [facility]  Date 09/08/23  Educator HB  Instruction Review Code 1- Verbalizes Understanding       Stress II: Relaxation -Discuss different types of relaxation techniques to limit stress.   Warning Signs of Stroke / TIA -Discuss definition of a stroke, what the signs and symptoms are of a stroke, and how to identify when someone is having stroke.   Knowledge Questionnaire Score:  Knowledge Questionnaire Score - 08/22/23 1020       Knowledge Questionnaire Score   Pre Score 22/26             Core Components/Risk Factors/Patient Goals at Admission:  Personal Goals and Risk Factors at Admission - 08/22/23 1017       Core Components/Risk Factors/Patient Goals on Admission    Weight Management Obesity    Hypertension Yes    Intervention Provide education on lifestyle modifcations including regular physical activity/exercise, weight management, moderate sodium restriction and increased consumption of fresh fruit, vegetables, and low fat dairy, alcohol moderation, and smoking cessation.;Monitor prescription use compliance.    Expected Outcomes Short Term: Continued assessment and intervention until BP is < 140/23mm HG in hypertensive  participants. < 130/31mm HG in hypertensive participants with diabetes, heart failure or chronic kidney disease.;Long Term: Maintenance of blood pressure at goal levels.    Lipids Yes    Intervention Provide education and support for participant on nutrition & aerobic/resistive exercise along with prescribed medications to achieve LDL 70mg , HDL >40mg .    Expected Outcomes Short Term: Participant states understanding of desired cholesterol values and is compliant with medications prescribed. Participant is following exercise prescription and nutrition guidelines.;Long Term: Cholesterol controlled with medications as prescribed, with individualized exercise RX and with personalized nutrition plan. Value goals: LDL < 70mg , HDL > 40 mg.             Core Components/Risk Factors/Patient Goals Review:   Goals and Risk Factor Review     Row Name 09/14/23 1307 10/04/23 1348           Core Components/Risk Factors/Patient Goals Review   Personal Goals Review Weight Management/Obesity;Hypertension Weight Management/Obesity;Hypertension      Review [name] is doing well in rehab.  His weight is staying steady at 93.5 kg and he continues to keep a close eye on it at dialysis as well. He has also been watching his blood pressures.  They have been good in class and at dialysis. [name] is doing well in rehab.  He has been maintaining his weight but would like to lose a couple of pounds.  He is feeling good on dialysis and not too dry.  His pressures continue to do well.      Expected Outcomes short: Continue to keep eye on blood pressure Long: Continue to monitor risk factors Short: Conitnue to work on weight loss long: Continue to monitor risk factors.               Core Components/Risk Factors/Patient Goals at Discharge (Final Review):   Goals and Risk Factor Review - 10/04/23 1348       Core Components/Risk Factors/Patient Goals Review   Personal Goals Review Weight Management/Obesity;Hypertension     Review [name] is doing well in rehab.  He has been maintaining his weight but would like to lose a couple of pounds.  He is feeling good on dialysis and not too dry.  His pressures continue to do well.    Expected Outcomes Short: Conitnue to work on [employer]  loss long: Continue to monitor risk factors.             ITP Comments:  ITP Comments     Row Name 08/25/23 1610 08/30/23 1047 09/21/23 0808 10/19/23 1122     ITP Comments Patient arrived for 1st visit/orientation/education at 0830. Patient was referred to CR by Dr. Odis Luster due to S/P Stent Placement. During orientation advised patient on arrival and appointment times what to wear, what to do before, during and after exercise. Reviewed attendance and class policy.  Pt is scheduled to return Cardiac Rehab on 08/30/23  at 1030. Pt was advised to come to class 15 minutes before class starts.  Discussed RPE/Dpysnea scales. Patient participated in warm up stretches. Patient was able to complete 6 minute walk test.  Telemetry: AFib. Patient was measured for the equipment. Discussed equipment safety with patient. Took patient pre-anthropometric measurements. Patient finished visit at 1035. First full day of exercise!  Patient was oriented to gym and equipment including functions, settings, policies, and procedures.  Patient's individual exercise prescription and treatment plan were reviewed.  All starting workloads were established based on the results of the 6 minute walk test done at initial orientation visit.  The plan for exercise progression was also introduced and progression will be customized based on patient's performance and goals. 30 day review completed. ITP sent to Dr. Dina Rich, Medical Director of Cardiac Rehab. Continue with ITP unless changes are made by physician. 30 day review completed. ITP sent to Dr. Dina Rich, Medical Director of Cardiac Rehab. Continue with ITP unless changes are made by physician.              Comments: 30 day review

## 2023-10-20 ENCOUNTER — Encounter (HOSPITAL_COMMUNITY)
Admission: RE | Admit: 2023-10-20 | Discharge: 2023-10-20 | Disposition: A | Discharge status: Home / Self Care | Payer: Medicare Other | Source: Ambulatory Visit | Attending: Cardiology | Admitting: Cardiology

## 2023-10-20 DIAGNOSIS — Z955 Presence of coronary angioplasty implant and graft: Secondary | ICD-10-CM | POA: Diagnosis not present

## 2023-10-20 NOTE — Progress Notes (Signed)
Daily Session Note  Patient Details  Name: Veron Posso MRN: [id number] Date of Birth: June 13, 1966 Referring Provider:   Flowsheet Row CARDIAC REHAB PHASE II ORIENTATION from 08/25/2023 in [facility] C[facility]AC REH[facility]  Referring Provider Odis Luster MD       Encounter Date: 10/20/2023  Check In:  Session Check In - 10/20/23 1015       Check-In   Supervising physician immediately available to respond to emergencies See telemetry face sheet for immediately available ER MD    Location [facility]    Staff Present Rodena Medin, RN, BSN;Heather Fredric Mare, BS, Exercise Physiologist    Virtual Visit No    Medication changes reported     No    Fall or balance concerns reported    No    Warm-up and Cool-down Performed on first and last piece of equipment    Resistance Training Performed Yes    VAD Patient? No    PAD/SET Patient? No      Pain Assessment   Currently in Pain? No/denies    Multiple Pain Sites No             Capillary Blood Glucose: No results found for this or any previous visit (from the past 24 hour(s)).    Social History   Tobacco Use  Smoking Status Never  Smokeless Tobacco Never    Goals Met:  Independence with exercise equipment Exercise tolerated well No report of concerns or symptoms today Strength training completed today  Goals Unmet:  Not Applicable  Comments: Pt able to follow exercise prescription today without complaint.  Will continue to monitor for progression.

## 2023-10-25 ENCOUNTER — Encounter (HOSPITAL_COMMUNITY)
Admission: RE | Admit: 2023-10-25 | Discharge: 2023-10-25 | Disposition: A | Discharge status: Home / Self Care | Payer: Medicare Other | Source: Ambulatory Visit | Attending: Cardiology | Admitting: Cardiology

## 2023-10-25 DIAGNOSIS — Z955 Presence of coronary angioplasty implant and graft: Secondary | ICD-10-CM | POA: Diagnosis not present

## 2023-10-25 NOTE — Progress Notes (Signed)
Daily Session Note  Patient Details  Name: [name] MRN: [id number] Date of Birth: 07-16-1966 Referring Provider:   Flowsheet Row CARDIAC REHAB PHASE II ORIENTATION from 08/25/2023 in [facility] C[facility]IAC REHABIL[facility]  Referring Provider Odis Luster MD       Encounter Date: 10/25/2023  Check In:  Session Check In - 10/25/23 1113       Check-In   Supervising physician immediately available to respond to emergencies See telemetry face sheet for immediately available MD    Location [facility]    Staff Present Ross Ludwig, BS, Exercise Physiologist;Phyllis Billingsley, RN;Jalan Fariss [place], MA, RCEP, CCRP, CCET    Virtual Visit No    Medication changes reported     No    Fall or balance concerns reported    No    Warm-up and Cool-down Performed on first and last piece of equipment    Resistance Training Performed Yes    VAD Patient? No    PAD/SET Patient? No      Pain Assessment   Currently in Pain? No/denies             Capillary Blood Glucose: No results found for this or any previous visit (from the past 24 hour(s)).    Social History   Tobacco Use  Smoking Status Never  Smokeless Tobacco Never    Goals Met:  Independence with exercise equipment Exercise tolerated well No report of concerns or symptoms today Strength training completed today  Goals Unmet:  Not Applicable  Comments: Pt able to follow exercise prescription today without complaint.  Will continue to monitor for progression.

## 2023-10-27 ENCOUNTER — Encounter (HOSPITAL_COMMUNITY)
Admission: RE | Admit: 2023-10-27 | Discharge: 2023-10-27 | Disposition: A | Discharge status: Home / Self Care | Payer: Medicare Other | Source: Ambulatory Visit | Attending: Cardiology | Admitting: Cardiology

## 2023-10-27 DIAGNOSIS — Z955 Presence of coronary angioplasty implant and graft: Secondary | ICD-10-CM

## 2023-10-27 NOTE — Progress Notes (Signed)
Daily Session Note  Patient Details  Name: [name] MRN: [id number] Date of Birth: Jun 16, 1966 Referring Provider:   Flowsheet Row CARDIAC REHAB PHASE II ORIENTATION from 08/25/2023 in [facility] CARDIAC REHABILITATION  Referring Provider Odis Luster MD       Encounter Date: 10/27/2023  Check In:  Session Check In - 10/27/23 1015       Check-In   Supervising physician immediately available to respond to emergencies See telemetry face sheet for immediately available ER MD    Location [facility]    Staff Present Rodena Medin, RN, BSN;Heather Fredric Mare, BS, Exercise Physiologist;Other   Ernestene Mention, NT   Virtual Visit No    Medication changes reported     No    Fall or balance concerns reported    No    Warm-up and Cool-down Performed on first and last piece of equipment    Resistance Training Performed Yes    VAD Patient? No    PAD/SET Patient? No      Pain Assessment   Currently in Pain? No/denies    Multiple Pain Sites No             Capillary Blood Glucose: No results found for this or any previous visit (from the past 24 hours).    Social History   Tobacco Use  Smoking Status Never  Smokeless Tobacco Never    Goals Met:  Independence with exercise equipment Exercise tolerated well No report of concerns or symptoms today Strength training completed today  Goals Unmet:  Not Applicable  Comments: Pt able to follow exercise prescription today without complaint.  Will continue to monitor for progression.

## 2023-11-01 ENCOUNTER — Encounter (HOSPITAL_COMMUNITY)
Admission: RE | Admit: 2023-11-01 | Discharge: 2023-11-01 | Disposition: A | Discharge status: Home / Self Care | Payer: Medicare Other | Source: Ambulatory Visit | Attending: Cardiology | Admitting: Cardiology

## 2023-11-01 DIAGNOSIS — Z955 Presence of coronary angioplasty implant and graft: Secondary | ICD-10-CM

## 2023-11-01 NOTE — Progress Notes (Signed)
Daily Session Note  Patient Details  Name: [name] MRN: [id number] Date of Birth: [birth year] Referring Provider:   Flowsheet Row CARDIAC REHAB PHASE II ORIENTATION from 08/25/2023 in [facility] CARDIAC REHABILITATION  Referring Provider Odis Luster MD       Encounter Date: 11/01/2023  Check In:  Session Check In - 11/01/23 1030       Check-In   Supervising physician immediately available to respond to emergencies See telemetry face sheet for immediately available ER MD    Location [facility]    Staff Present Ross Ludwig, BS, Exercise Physiologist;Caiden Arteaga, RN;Jessica [place], MA, RCEP, CCRP, CCET    Virtual Visit No    Medication changes reported     No    Fall or balance concerns reported    No    Warm-up and Cool-down Performed on first and last piece of equipment    Resistance Training Performed Yes    VAD Patient? No    PAD/SET Patient? No      Pain Assessment   Currently in Pain? No/denies    Multiple Pain Sites No             Capillary Blood Glucose: No results found for this or any previous visit (from the past 24 hours).    Social History   Tobacco Use  Smoking Status Never  Smokeless Tobacco Never    Goals Met:  Independence with exercise equipment Exercise tolerated well No report of concerns or symptoms today Strength training completed today  Goals Unmet:  Not Applicable  Comments: Pt able to follow exercise prescription today without complaint.  Will continue to monitor for progression.

## 2023-11-03 ENCOUNTER — Encounter (HOSPITAL_COMMUNITY)
Admission: RE | Admit: 2023-11-03 | Discharge: 2023-11-03 | Disposition: A | Discharge status: Home / Self Care | Payer: Medicare Other | Source: Ambulatory Visit | Attending: Cardiology | Admitting: Cardiology

## 2023-11-03 DIAGNOSIS — Z955 Presence of coronary angioplasty implant and graft: Secondary | ICD-10-CM

## 2023-11-03 NOTE — Progress Notes (Signed)
Daily Session Note  Patient Details  Name: [name] MRN: [id number] Date of Birth: [birth year] Referring Provider:   Flowsheet Row CARDIAC REHAB PHASE II ORIENTATION from 08/25/2023 in [facility] CARDIAC REHABILITATION  Referring Provider Odis Luster MD       Encounter Date: 11/03/2023  Check In:  Session Check In - 11/03/23 1330       Check-In   Supervising physician immediately available to respond to emergencies See telemetry face sheet for immediately available MD    Location [facility]    Staff Present Ross Ludwig, BS, Exercise Physiologist;Other    Virtual Visit No    Medication changes reported     No    Warm-up and Cool-down Performed on first and last piece of equipment    Resistance Training Performed Yes    VAD Patient? No    PAD/SET Patient? No      Pain Assessment   Currently in Pain? No/denies    Multiple Pain Sites No             Capillary Blood Glucose: No results found for this or any previous visit (from the past 24 hours).    Social History   Tobacco Use  Smoking Status Never  Smokeless Tobacco Never    Goals Met:  Independence with exercise equipment Exercise tolerated well No report of concerns or symptoms today Strength training completed today  Goals Unmet:  Not Applicable  Comments: Pt able to follow exercise prescription today without complaint.  Will continue to monitor for progression.

## 2023-11-08 ENCOUNTER — Encounter (HOSPITAL_COMMUNITY): Payer: Medicare Other

## 2023-11-10 ENCOUNTER — Encounter (HOSPITAL_COMMUNITY)
Admission: RE | Admit: 2023-11-10 | Discharge: 2023-11-10 | Disposition: A | Discharge status: Home / Self Care | Payer: Medicare Other | Source: Ambulatory Visit | Attending: Cardiology | Admitting: Cardiology

## 2023-11-10 DIAGNOSIS — Z955 Presence of coronary angioplasty implant and graft: Secondary | ICD-10-CM

## 2023-11-10 NOTE — Progress Notes (Signed)
Daily Session Note  Patient Details  Name: [name] MRN: [id number] Date of Birth: 1966/04/09 Referring Provider:   Flowsheet Row CARDIAC REHAB PHASE II ORIENTATION from 08/25/2023 in [facility] CARDIAC REHABILITATION  Referring Provider Odis Luster MD       Encounter Date: 11/10/2023  Check In:  Session Check In - 11/10/23 1030       Check-In   Supervising physician immediately available to respond to emergencies See telemetry face sheet for immediately available MD    Location [facility]    Staff Present Ross Ludwig, BS, Exercise Physiologist;Jessica Juanetta [place], MA, RCEP, CCRP, CCET    Staff Present Cyndia Diver, RN, BSN, MA    Virtual Visit No    Medication changes reported     No    Fall or balance concerns reported    No    Tobacco Cessation No Change    Warm-up and Cool-down Performed on first and last piece of equipment    Resistance Training Performed Yes    VAD Patient? No    PAD/SET Patient? No      Pain Assessment   Currently in Pain? No/denies    Multiple Pain Sites No             Capillary Blood Glucose: No results found for this or any previous visit (from the past 24 hours).    Social History   Tobacco Use  Smoking Status Never  Smokeless Tobacco Never    Goals Met:  Independence with exercise equipment Exercise tolerated well No report of concerns or symptoms today Strength training completed today  Goals Unmet:  Not Applicable  Comments: Pt able to follow exercise prescription today without complaint.  Will continue to monitor for progression.

## 2023-11-15 ENCOUNTER — Encounter (HOSPITAL_COMMUNITY): Payer: Self-pay | Admitting: *Deleted

## 2023-11-15 ENCOUNTER — Encounter (HOSPITAL_COMMUNITY): Payer: Medicare Other

## 2023-11-15 DIAGNOSIS — Z955 Presence of coronary angioplasty implant and graft: Secondary | ICD-10-CM

## 2023-11-15 NOTE — Progress Notes (Signed)
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 started dialysis. He says he lost a lot of weight working out but has gained some of the weight back. He says he sleeps well at night. His main goal for the program is to get back into an exercise routine and start going back to the gym routinely. He also wants to get healthier overall.    Expected Outcomes Short Term: Start the program and attend consistently. Long Term: meet his personal goals.    Continue Psychosocial Services  No Follow up required             Psychosocial Re-Evaluation:  Psychosocial Re-Evaluation     Row Name 09/14/23 1146 10/04/23 1344 11/10/23 1252         Psychosocial Re-Evaluation   Current issues with Current Stress Concerns Current Stress Concerns Current Stress Concerns     Comments Chyrl is doing well in rehab.  He is feeling good mentally but does worry about his kids a lot.  He also continues to worry about his own health issues as he is on  dialysis every other day.  He usually sleeps well. Chyrl is doing well in rehab.  He continues to stress over his 55yo twins.  He says his boy is starting to push his buttons!  He also has a 23 and 55 yo but enjoys every day he has with his kids.  He is working some and sleeps well. Chyrl is doing well in rehab.  He enjoyed Christmas and had fun watching the twins play with their new dirt bike yesterday.  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     Expected Outcomes short: Continue to cope with his healthy issues Long: COnitnue to focus on the positives Short: Enjoy time with his kids Long: Continue to focus on the positives Short: Continue to enjoy high from Christmas fun Long: Continue to build strength for surgery     Interventions Encouraged to attend Cardiac Rehabilitation for the exercise Encouraged to attend Cardiac Rehabilitation for the exercise Encouraged to attend Cardiac Rehabilitation for the exercise     Continue Psychosocial Services  Follow up required by staff Follow up required by staff Follow up required by staff              Psychosocial Discharge (Final Psychosocial Re-Evaluation):  Psychosocial Re-Evaluation - 11/10/23 1252       Psychosocial Re-Evaluation   Current issues with Current Stress Concerns    Comments Chyrl is doing well in rehab.  He enjoyed Christmas and had fun watching the twins play with their new dirt bike yesterday.  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    Expected Outcomes Short: Continue to enjoy high from Christmas fun Long: Continue to build strength for surgery    Interventions Encouraged to attend Cardiac Rehabilitation for the exercise    Continue Psychosocial Services  Follow up required by staff             Vocational Rehabilitation: Provide vocational rehab assistance to qualifying  candidates.   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             Education: Education Goals: Education classes will be provided on a weekly basis, covering required topics. Participant will state understanding/return demonstration of topics presented.  Learning Barriers/Preferences:  Learning Barriers/Preferences - 08/22/23 1016       Learning Barriers/Preferences   Learning Barriers None    Learning Preferences Skilled Demonstration             Education Topics: Hypertension, Hypertension Reduction -Define heart disease and high blood pressure. Discus how high blood pressure affects the body and ways to reduce high blood pressure.   Exercise and Your Heart -Discuss why it is important to exercise, the FITT principles of exercise, normal and abnormal responses to exercise, and how to exercise safely. Flowsheet Row CARDIAC REHAB PHASE II EXERCISE from 11/03/2023 in [place] [facility]  Date 10/27/23  Educator HB  Instruction Review Code 1- Verbalizes Understanding       Angina -Discuss definition of angina, causes of angina, treatment of angina, and how to decrease risk of having angina.   Cardiac Medications -Review what the following cardiac medications are used for, how they affect the body, and side effects that may occur when taking the medications.  Medications include Aspirin , Beta blockers, calcium  channel blockers, ACE Inhibitors, angiotensin receptor blockers, diuretics, digoxin , and antihyperlipidemics. Flowsheet Row CARDIAC REHAB PHASE II EXERCISE from 11/03/2023 in [place] [facility]  Date 09/22/23  Educator hb  Instruction Review Code 1- Verbalizes Understanding        Congestive Heart Failure -Discuss the definition of CHF, how to live with CHF, the signs and symptoms of CHF, and how keep track of weight and sodium intake. Flowsheet Row CARDIAC REHAB PHASE II EXERCISE from 11/03/2023 in [place] [facility]  Date 09/15/23  Educator hb  Instruction Review Code 1- Verbalizes Understanding       Heart Disease and Intimacy -Discus the effect sexual activity has on the heart, how changes occur during intimacy as we age, and safety during sexual activity. Flowsheet Row CARDIAC REHAB PHASE II EXERCISE from 11/03/2023 in [place] [facility]  Date 10/20/23  Educator HB  Instruction Review Code 1- Verbalizes Understanding       Smoking Cessation / COPD -Discuss different methods to quit smoking, the health benefits of quitting smoking, and the definition of COPD.   Nutrition I: Fats -Discuss the types of cholesterol, what cholesterol does to the heart, and how cholesterol levels can be controlled. Flowsheet Row CARDIAC REHAB PHASE II EXERCISE from 11/03/2023 in [place] [facility]  Date 11/03/23  Educator HB  Instruction Review Code 1- Verbalizes Understanding       Nutrition II: Labels -Discuss the different components of food labels and how to read food label Flowsheet Row CARDIAC REHAB PHASE II EXERCISE from 11/03/2023 in [place] [facility]  Date 11/03/23  Educator HB  Instruction Review Code 1- Verbalizes Understanding       Heart Parts/Heart Disease and PAD -Discuss the anatomy of the heart, the pathway of blood circulation through the heart, and these are affected by heart disease. Flowsheet Row CARDIAC REHAB PHASE II EXERCISE from 11/03/2023 in [place] [facility]  Date 10/06/23  Educator hb  Instruction Review Code 1- Verbalizes Understanding  Stress I: Signs and Symptoms -Discuss the causes of stress, how stress may lead to anxiety and  depression, and ways to limit stress. Flowsheet Row CARDIAC REHAB PHASE II EXERCISE from 11/03/2023 in [place] [facility]  Date 09/08/23  Educator HB  Instruction Review Code 1- Verbalizes Understanding       Stress II: Relaxation -Discuss different types of relaxation techniques to limit stress.   Warning Signs of Stroke / TIA -Discuss definition of a stroke, what the signs and symptoms are of a stroke, and how to identify when someone is having stroke.   Knowledge Questionnaire Score:  Knowledge Questionnaire Score - 08/22/23 1020       Knowledge Questionnaire Score   Pre Score 22/26             Core Components/Risk Factors/Patient Goals at Admission:  Personal Goals and Risk Factors at Admission - 08/22/23 1017       Core Components/Risk Factors/Patient Goals on Admission    Weight Management Obesity    Hypertension Yes    Intervention Provide education on lifestyle modifcations including regular physical activity/exercise, weight management, moderate sodium restriction and increased consumption of fresh fruit, vegetables, and low fat dairy, alcohol  moderation, and smoking cessation.;Monitor prescription use compliance.    Expected Outcomes Short Term: Continued assessment and intervention until BP is < 140/3mm HG in hypertensive participants. < 130/79mm HG in hypertensive participants with diabetes, heart failure or chronic kidney disease.;Long Term: Maintenance of blood pressure at goal levels.    Lipids Yes    Intervention Provide education and support for participant on nutrition & aerobic/resistive exercise along with prescribed medications to achieve LDL 70mg , HDL >40mg .    Expected Outcomes Short Term: Participant states understanding of desired cholesterol values and is compliant with medications prescribed. Participant is following exercise prescription and nutrition guidelines.;Long Term: Cholesterol controlled with medications as prescribed,  with individualized exercise RX and with personalized nutrition plan. Value goals: LDL < 70mg , HDL > 40 mg.             Core Components/Risk Factors/Patient Goals Review:   Goals and Risk Factor Review     Row Name 09/14/23 1307 10/04/23 1348 11/10/23 1255         Core Components/Risk Factors/Patient Goals Review   Personal Goals Review Weight Management/Obesity;Hypertension Weight Management/Obesity;Hypertension Weight Management/Obesity;Hypertension     Review Chyrl is doing well in rehab.  His weight is staying steady at 93.5 kg and he continues to keep a close eye on it at dialysis as well. He has also been watching his blood pressures.  They have been good in class and at dialysis. Chyrl is doing well in rehab.  He has been maintaining his weight but would like to lose a couple of pounds.  He is feeling good on dialysis and not too dry.  His pressures continue to do well. Chyrl is doing well in rehab.  His weight is doing well.  Pressures are maintaining and he is feeling good overall.  He is eager to get off plavix  and back on transplant list.     Expected Outcomes short: Continue to keep eye on blood pressure Long: Continue to monitor risk factors Short: Conitnue to work on weight loss long: Continue to monitor risk factors. Short: Conitnue to work on weight loss long: Continue to monitor risk factors.              Core Components/Risk Factors/Patient Goals at Discharge (Final Review):   Goals and Risk  Factor Review - 11/10/23 1255       Core Components/Risk Factors/Patient Goals Review   Personal Goals Review Weight Management/Obesity;Hypertension    Review Chyrl is doing well in rehab.  His weight is doing well.  Pressures are maintaining and he is feeling good overall.  He is eager to get off plavix  and back on transplant list.    Expected Outcomes Short: Conitnue to work on weight loss long: Continue to monitor risk factors.             ITP Comments:  ITP Comments      Row Name 08/25/23 9072 08/30/23 1047 09/21/23 0808 10/19/23 1122 11/15/23 1427   ITP Comments Patient arrived for 1st visit/orientation/education at 0830. Patient was referred to CR by Dr. Heron Collum due to S/P Stent Placement. During orientation advised patient on arrival and appointment times what to wear, what to do before, during and after exercise. Reviewed attendance and class policy.  Pt is scheduled to return Cardiac Rehab on 08/30/23  at 1030. Pt was advised to come to class 15 minutes before class starts.  Discussed RPE/Dpysnea scales. Patient participated in warm up stretches. Patient was able to complete 6 minute walk test.  Telemetry: AFib. Patient was measured for the equipment. Discussed equipment safety with patient. Took patient pre-anthropometric measurements. Patient finished visit at 1035. First full day of exercise!  Patient was oriented to gym and equipment including functions, settings, policies, and procedures.  Patient's individual exercise prescription and treatment plan were reviewed.  All starting workloads were established based on the results of the 6 minute walk test done at initial orientation visit.  The plan for exercise progression was also introduced and progression will be customized based on patient's performance and goals. 30 day review completed. ITP sent to Dr. Dorn Ross, Medical Director of Cardiac Rehab. Continue with ITP unless changes are made by physician. 30 day review completed. ITP sent to Dr. Dorn Ross, Medical Director of Cardiac Rehab. Continue with ITP unless changes are made by physician. 30 day review completed. ITP sent to Dr. Dorn Ross, Medical Director of Cardiac Rehab. Continue with ITP unless changes are made by physician.            Comments: 30 day review

## 2023-11-17 ENCOUNTER — Encounter (HOSPITAL_COMMUNITY): Payer: Medicare Other

## 2023-11-22 ENCOUNTER — Encounter (HOSPITAL_COMMUNITY): Payer: Medicare Other

## 2023-11-24 ENCOUNTER — Encounter (HOSPITAL_COMMUNITY): Payer: Medicare Other

## 2023-11-29 ENCOUNTER — Encounter (HOSPITAL_COMMUNITY): Payer: Medicare Other

## 2023-11-29 NOTE — Progress Notes (Signed)
 Cardiology  Clinic - FOLLOWUP Visit   Date of Service : 11/29/2023    Name: [name] Patient ID: [id number] Age: 58 y.o. 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 Alexandria RTC in 6 months.   .Patient advised aggressive risk factor and lifestyle modification- diet and exercise counseling done .Patient advised strict compliance with medication and follow-up .Handouts were given to patient along with  trustworthy websites for more information  No follow-ups on file.       Gordan Folks, MD [facility] Cardiology/[name] [address] [place], [place] [address]  Fax no [phone] November 29, 2023   No orders of the defined types were placed in this encounter.    New Medications Ordered This Visit  Medications  . metoprolol  succinate XL (Toprol -XL) 25 MG 24 hr tablet    Sig: Take 1 tablet by mouth daily.  SABRA azithromycin (Zithromax) 250 mg tablet    Sig: TAKE 2 TABLETS BY MOUTH TODAY, THEN TAKE 1 TABLET DAILY FOR 4 DAYS AS DIRECTED      HISTORY    [name] is a 58 y.o. 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  He had a LAD PCI with DES and a Left Circ-OM1 DES X 2 placed on 07/12/23.  INTERVAL HISTORY    He is doing well.  He denies CP DOE, PND, orthopnea, syncope,near-syncope, or palpitations.  He has dialysis M, W, and F.  His dry weight is 95 lbs.  His BP runs 80-105 systolic.  He is still waiting to get back on the kidney transplant list once his Plavix  can be stopped.  Review of Systems  All other systems reviewed and are negative.     Allergies:  Codeine and Vancomycin   Past Medical History / Problem List  Patient Active Problem List   Diagnosis Date Noted  . BPH (benign prostatic hyperplasia) 08/27/2022  . Vitamin D  deficiency 08/27/2022  . Hyperlipidemia 06/15/2022  . Obstructive sleep apnea syndrome 06/15/2022  . Presence of Watchman left atrial appendage closure device 06/15/2022  . Coronary artery disease involving native coronary artery of native heart without angina pectoris 12/02/2021  . GI bleed 08/27/2021  . AVM (arteriovenous malformation) of duodenum, acquired with hemorrhage 04/28/2021  . ESRD on dialysis (CMS/HCC) 04/28/2021  . Hematoma of left lower extremity 04/28/2021  . Hypovolemic shock (CMS/HCC) 04/28/2021  . S/P coronary artery stent placement 04/28/2021  . Swelling of calf 04/28/2021  . Colon cancer screening 09/03/2020  .  GERD (gastroesophageal reflux disease) 09/03/2020  . MRSA carrier 10/08/2019  . Escherichia coli (E. coli) infection 08/13/2019  . Anemia 05/17/2019  . Atrial fibrillation (CMS/HCC) 05/17/2019  . Gout 05/17/2019  . Pulmonary HTN (CMS/HCC) 05/17/2019  . Renal insufficiency 11/15/2016  . History of kidney transplant 01/12/2016  . Obesity 04/16/2014  . Hypertension 07/13/2011  . Encounter for preoperative evaluation of living donor of kidney 05/26/2023  . Patient awaiting renal transplant 05/26/2023  . Abnormal  cardiovascular stress test 12/29/2020     Surgical History  Past Surgical History:  Procedure Laterality Date  . CARDIAC CATHETERIZATION  09/20/2017  . CARDIAC CATHETERIZATION Left 05/10/2023  . CARDIAC CATHETERIZATION Right 12/24/2021  . CARDIAC CATHETERIZATION  01/03/2020  . CARDIAC SURGERY    . COLONOSCOPY  12/09/2020  . ESOPHAGOGASTRODUODENOSCOPY  03/31/2021  . NEPHRECTOMY Right 01/07/2020   of the transplated kidney  . OTHER SURGICAL HISTORY  12/09/2016   Biopsy of transplant kidney using ultrasound guidance  . OTHER SURGICAL HISTORY  06/04/2021   GIVENS CAPSULE STUDY  . SKIN GRAFT  03/22/2023   5 x 4 cm for leg wound  . SMALL BOWEL ENTEROSCOPY  12/03/2021  . TRANSPLANTATION RENAL  12/30/2003  . UPPER GASTROINTESTINAL ENDOSCOPY  12/03/2021  . VASCULAR SURGERY    . WOUND DEBRIDEMENT Left 10/21/2022   and irraigation of leg wound    Social History  He reports that he has never smoked. He has never used smokeless tobacco. He reports that he does not drink alcohol  and does not use drugs.  Family History  Family History  Problem Relation Name Age of Onset  . Diabetes Mother Lamar fireman   . Hypertension Mother Lamar fireman   . Heart disease Mother Lamar fireman   . Heart failure Father Lamar fireman   . Hypertension Father Lamar fireman   . Stroke Mother's Brother Rome      MEDICATIONS  Current Outpatient Medications  Medication Sig Dispense Refill  . allopurinol  (Zyloprim ) 100 MG tablet Take 100 mg by mouth daily    . ascorbic acid  (Vitamin C ) 500 MG tablet Take 1 tablet by mouth daily.    . aspirin  81 MG EC tablet Take 81 mg by mouth daily.    . atorvastatin  (Lipitor ) 80 MG tablet Take 1 tablet by mouth.    SABRA azithromycin (Zithromax) 250 mg tablet TAKE 2 TABLETS BY MOUTH TODAY, THEN TAKE 1 TABLET DAILY FOR 4 DAYS AS DIRECTED    . cholecalciferol  (Vitamin D -3) 25 MCG (1000 UT) tablet Take 1 tablet by mouth daily.    . clopidogrel  (Plavix ) 75 MG tablet Take 1 tablet  by mouth daily. 30 tablet 11  . cyclobenzaprine  (Flexeril ) 10 MG tablet Take 10 mg by mouth 2 times daily as needed.    . famotidine  (Pepcid ) 20 MG tablet Take by mouth.    . Ferric Citrate  (Auryxia ) 1 GM 210 MG(Fe) tablet Take 210 mg by mouth 3 times a day. 2 tabs TID with Meals 1 tabs with sancks    . metoprolol  succinate XL (Toprol -XL) 25 MG 24 hr tablet Take 1 tablet by mouth daily.    . midodrine  (Proamatine ) 10 MG tablet Take 1 tablet by mouth every 8 hours.    . midodrine  (Proamatine ) 5 MG tablet Take 20 mg by mouth as needed (per patient takes 20mg  befoer dialysis dialysis).    . Multiple Vitamins-Minerals (Multi Vitamin/Minerals) tablet Take 1 tablet by mouth every morning.    . nitroglycerin (Nitrostat) 0.4 mg SL tablet  Place 1 tablet under the tongue every 5 minutes if needed for chest pain for up to 30 doses. 30 tablet 0  . pantoprazole  (Protonix ) 40 MG EC tablet Take 40 mg by mouth daily before breakfast. Do not crush, chew, or split.    . tamsulosin  (Flomax ) 0.4 MG 24 hr capsule Take by mouth daily.     No current facility-administered medications for this visit.     Vital Signs  Visit Vitals BP 93/67  Pulse 66  Resp 14  Ht 1.85 m  Wt 94.3 kg  SpO2 97%  BMI 27.57 kg/m  Smoking Status Never  BSA 2.2 m    Physical Exam Constitutional:      Appearance: Normal appearance.  HENT:     Head: Normocephalic and atraumatic.     Nose: Nose normal.     Mouth/Throat:     Mouth: Mucous membranes are moist.  Eyes:     Extraocular Movements: Extraocular movements intact.     Pupils: Pupils are equal, round, and reactive to light.  Cardiovascular:     Rate and Rhythm: Normal rate and regular rhythm.  Pulmonary:     Effort: Pulmonary effort is normal.     Breath sounds: Normal breath sounds.  Abdominal:     General: Abdomen is flat.     Palpations: Abdomen is soft.  Musculoskeletal:        General: Normal range of motion.     Cervical back: Normal range of motion and  neck supple.  Skin:    General: Skin is warm.  Neurological:     Mental Status: He is alert.     CARDIOGRAPHICS      Heart Catheterization - I have reviewed the study dated 08/16/23  and it shows: 1. Successful LCX-OM1 PCI with intravascular lithotripsy and overlapping 4.0 x 12 mm and 3.5 x 22 mm Onyx Frontier DES, post-dilated to 4.5 mm proximally 2. Successful IVUS-guided LAD PCI with rotational atherectomy, intravascular lithotripsy, and a 3.5 x 38 mm Onyx Frontier DES, post-dilated to 4.5 mm proximally  Echocardiogram - I have reviewed the study dated 01/16/23 and it shows:  1. Left ventricular ejection fraction, by estimation, is 65 to 70%. The left ventricle has normal function. The left ventricle has no regional wall motion abnormalities. There is moderate asymmetric left ventricular hypertrophy of the basal-septal  segment. Left ventricular diastolic parameters are indeterminate.   2. Right ventricular systolic function is mildly reduced. The right ventricular size is mildly enlarged. There is normal pulmonary artery systolic pressure. The estimated right ventricular systolic pressure is 33.4 mmHg.   3. Right atrial size was mildly dilated.   4. The mitral valve is degenerative. Trivial mitral valve regurgitation.   5. The aortic valve was not well visualized. Aortic valve regurgitation is not visualized. Aortic valve sclerosis/calcification is present, without any evidence of aortic stenosis.   6. The inferior vena cava is dilated in size with >50% respiratory variability, suggesting right atrial pressure of 8 mmHg.   ECG - I have reviewed the study dated  Encounter Date: 08/16/23  ECG 12 lead - Clinic Performed  Result Value   Vent Rate 106   Atrial Rate 0   PR Interval 136   QRS Duration 110   QT/QTc 353   QTc Calculation 469   P-Axis 0   R-Axis 76   T-Axis 42   Narrative   Atrial fib Ventricular premature complex Anterior infarct, old  Confirmed by Denlinger,  Bethany ([id number]) on  08/16/2023 11:05:10 AM     Cardiology Quality Measures  1. Process Quality ID#118(NQF 0066)--Coronary artery disease:  Angiotensin-converting enzyme inhibitor or angiotensin receptor blocker therapy-diabetes or left ventricular systolic dysfunction with ejection fraction less than 40%-once per performance.  Registry only (18 years and older)-(G8934)  Criteria 1 All patients with diagnosis of coronary artery disease with ejection fraction of 40% (without diagnosis of diabetes mellitus ):  .Not Applicable  Criteria 2-All patients with diagnosis of coronary artery disease who have diabetes mellitus as Denominator   [phone]) Angiotensin receptor converting enzyme inhibitor or angiotensin receptor blocker therapy not prescribed for reasons documented by clinician (example allergy, intolerance, pregnancy, renal failure due to Ace inhibitors, disease of aortic and mitral valve, other medical reason) or example ( patient declined or other patient reason) or example (lack of drug availability, other reasons attributable to Healthcare System)    2. Process  Quality ID# 243 (NQF [id number] ):  Cardiac Rehabilitation patient referral from outpatient setting-National quality strategy domain-(who in previous 12 months have had chronic stable angina, acute myocardial infarction or having undergone coronary artery bypass graft, percutaneous coronary intervention, cardiac valve surgery, cardiac transplantation, chronic heart failure with reduced ejection fraction less than 35%) once poor performance., registry only (all patient's)--   1460F  Not Applicable  3. Process Quality ID #326 (NQF 1525) :  Atrial fibrillation and atrial flutter:  Chronic Anticoagulation Therapy-National Quality Strategy Domain:  Effective clinical care--all patients age 19 years and older with diagnosis of non valvular atrial fibrillation or atrial flutter who do not have documented chads Vasc risk score of 0 or 1  (  H1031) documentation of medical reason for not prescribing warfarin or another FDA approved anticoagulation (exam atrial appendage device in place  Denominator Exclusions  Not  applicable  4. Process  Quality ID  # 6 ( NQF  [id number]) :  Coronary Artery Disease:  Antiplatelet Therapy-National Quality Strategy Domain:  Effective Clinical Care--all patient's age 84 years or older with diagnosis of coronary artery disease seen within 12 months period      (4086F) Aspirin  or Clopidogrel  Prescribed   5. Intermediate outcome    Quality ID # 236 (NQ F 0018) :  Controlling high blood pressure-national quality strategy dominant:  Effective clinical care-percentage of patient 62-24 years of age who have diagnosis of hypertension and was blood pressure was adequately controlled (< 140/90 mm of mercury during the measurement period.    [phone] ) Most Recent Systolic Blood Pressure< 140 mm  Of Hg    Denominator Exclusions:   Not Applicable  6. Process Quality ID  # 130 ( NQF [id number] ) :  Documentation Of Current Medications In Medical Records-National Quality Strategy Domain:  Patient Safety-patient age more than 18 years on date of encounter    ( H1572 )  Eligible clinician attest to documenting in the medical records they obtain, updated or reviewed the patient's current medications      Improvement Activities   No1  Implementation of Use of Specialist Reports Back to Referring Clinician or Group to Close Referral Loop Performance of regular practices that include providing specialist reports back to the referring individual MIPS eligible clinician or group to close the referral loop or where the referring individual MIPS eligible clinician or group initiates regular inquiries to specialist for specialist reports which could be documented or noted in the EHR technology Note was sent to [facility], MD   No referring provider defined for this encounter.  No 2  Annual registration in the  Prescription Drug Monitoring Program  Annual registration by eligible clinician or group in the prescription drug monitoring program of the state where they practice. Activities that simply involve registration are not sufficient. MIPS eligible clinicians and groups must participate for a minimum of 6 months.  No3   Patient Navigator Program-  Implement a Patient Navigator Program that offers evidence-based resources and tools to reduce avoidable hospital readmissions, utilizing a patient-centered and team-based approach, leveraging evidence-based best practices to improve care for patients by making hospitalizations less stressful, and the recovery period more supportive by implementing quality improvement strategies.  No 4 Regularly assess the patient experience of care through surveys, advisory councils and/or other mechanisms. Regularly assess the patient experience of care through surveys, advisory councils and/or other mechanisms.  No 5  Collection and follow-up on patient experience and satisfaction data on beneficiary engagement Collection and follow-up on patient experience and satisfaction data on beneficiary engagement, including development of improvement plan.   Voice - recognition dictation software was used in  the generation of this note.  Errors may exist. if there is any potential confusion or discrepancy, please feel free to contact me for clarification

## 2023-12-01 ENCOUNTER — Encounter (HOSPITAL_COMMUNITY)
Admission: RE | Admit: 2023-12-01 | Discharge: 2023-12-01 | Disposition: A | Discharge status: Home / Self Care | Payer: Medicare Other | Source: Ambulatory Visit | Attending: Cardiology | Admitting: Cardiology

## 2023-12-01 DIAGNOSIS — Z955 Presence of coronary angioplasty implant and graft: Secondary | ICD-10-CM | POA: Insufficient documentation

## 2023-12-01 NOTE — Progress Notes (Signed)
Daily Session Note  Patient Details  Name: Happy Covin MRN: [id number] Date of Birth: [birth year] Referring Provider:   Flowsheet Row CARDIAC REHAB PHASE II ORIENTATION from 08/25/2023 in [facility] CARDIAC REHABILITATION  Referring Provider Odis Luster MD       Encounter Date: 12/01/2023  Check In:  Session Check In - 12/01/23 1030       Check-In   Supervising physician immediately available to respond to emergencies See telemetry face sheet for immediately available MD    Location [facility]    Staff Present Sabino Dick, NT;Moses Odoherty Gaynell Face, Exercise Physiologist    Virtual Visit No    Medication changes reported     No    Fall or balance concerns reported    No    Tobacco Cessation No Change    Warm-up and Cool-down Performed on first and last piece of equipment    Resistance Training Performed Yes    VAD Patient? No    PAD/SET Patient? No      Pain Assessment   Currently in Pain? No/denies    Multiple Pain Sites No             Capillary Blood Glucose: No results found for this or any previous visit (from the past 24 hours).    Social History   Tobacco Use  Smoking Status Never  Smokeless Tobacco Never    Goals Met:  Independence with exercise equipment Exercise tolerated well No report of concerns or symptoms today Strength training completed today  Goals Unmet:  Not Applicable  Comments: Pt able to follow exercise prescription today without complaint.  Will continue to monitor for progression.

## 2023-12-06 ENCOUNTER — Encounter (HOSPITAL_COMMUNITY)
Admission: RE | Admit: 2023-12-06 | Discharge: 2023-12-06 | Disposition: A | Discharge status: Home / Self Care | Payer: Medicare Other | Source: Ambulatory Visit | Attending: Cardiology | Admitting: Cardiology

## 2023-12-06 DIAGNOSIS — Z955 Presence of coronary angioplasty implant and graft: Secondary | ICD-10-CM | POA: Diagnosis not present

## 2023-12-06 NOTE — Progress Notes (Signed)
Daily Session Note  Patient Details  Name: Chappell Sartini MRN: [id number] Date of Birth: 11-25-1965 Referring Provider:   Flowsheet Row CARDIAC REHAB PHASE II ORIENTATION from 08/25/2023 in [facility] CARDIAC REHABILITATION  Referring Provider Odis Luster MD       Encounter Date: 12/06/2023  Check In:  Session Check In - 12/06/23 1029       Check-In   Supervising physician immediately available to respond to emergencies See telemetry face sheet for immediately available MD    Location [facility]    Staff Present Ross Ludwig, BS, Exercise Physiologist;Brooke Elwyn Reach, RN    Virtual Visit No    Medication changes reported     No    Fall or balance concerns reported    No    Tobacco Cessation No Change    Warm-up and Cool-down Performed on first and last piece of equipment    Resistance Training Performed Yes    VAD Patient? No    PAD/SET Patient? No      Pain Assessment   Currently in Pain? No/denies    Multiple Pain Sites No             Capillary Blood Glucose: No results found for this or any previous visit (from the past 24 hours).    Social History   Tobacco Use  Smoking Status Never  Smokeless Tobacco Never    Goals Met:  Independence with exercise equipment Using PLB without cueing & demonstrates good technique Exercise tolerated well No report of concerns or symptoms today Strength training completed today  Goals Unmet:  Not Applicable  Comments: Pt able to follow exercise prescription today without complaint.  Will continue to monitor for progression.

## 2023-12-08 ENCOUNTER — Encounter (HOSPITAL_COMMUNITY)
Admission: RE | Admit: 2023-12-08 | Discharge: 2023-12-08 | Disposition: A | Discharge status: Home / Self Care | Payer: Medicare Other | Source: Ambulatory Visit | Attending: Cardiology | Admitting: Cardiology

## 2023-12-08 VITALS — Ht 72.0 in | Wt 209.4 lb

## 2023-12-08 DIAGNOSIS — Z955 Presence of coronary angioplasty implant and graft: Secondary | ICD-10-CM

## 2023-12-08 NOTE — Progress Notes (Signed)
Daily Session Note  Patient Details  Name: [name] MRN: [id number] Date of Birth: 1966-05-21 Referring Provider:   Flowsheet Row CARDIAC REHAB PHASE II ORIENTATION from 08/25/2023 in [facility] CARDIAC REHABILITATION  Referring Provider Odis Luster MD       Encounter Date: 12/08/2023  Check In:  Session Check In - 12/08/23 1030       Check-In   Supervising physician immediately available to respond to emergencies See telemetry face sheet for immediately available MD    Location [facility]    Staff Present Ross Ludwig, BS, Exercise Physiologist;Brooke Elwyn Reach, RN    Virtual Visit No    Medication changes reported     No    Fall or balance concerns reported    No    Tobacco Cessation No Change    Warm-up and Cool-down Performed on first and last piece of equipment    Resistance Training Performed Yes    VAD Patient? No    PAD/SET Patient? No      Pain Assessment   Currently in Pain? No/denies    Multiple Pain Sites No             Capillary Blood Glucose: No results found for this or any previous visit (from the past 24 hours).    Social History   Tobacco Use  Smoking Status Never  Smokeless Tobacco Never    Goals Met:  Independence with exercise equipment Exercise tolerated well No report of concerns or symptoms today Strength training completed today  Goals Unmet:  Not Applicable  Comments: Pt able to follow exercise prescription today without complaint.  Will continue to monitor for progression.

## 2023-12-13 ENCOUNTER — Encounter (HOSPITAL_COMMUNITY)
Admission: RE | Admit: 2023-12-13 | Discharge: 2023-12-13 | Disposition: A | Discharge status: Home / Self Care | Payer: Medicare Other | Source: Ambulatory Visit | Attending: Cardiology | Admitting: Cardiology

## 2023-12-13 DIAGNOSIS — Z955 Presence of coronary angioplasty implant and graft: Secondary | ICD-10-CM

## 2023-12-13 NOTE — Progress Notes (Signed)
Discharge Progress Report  Patient Details  Name: [name] MRN: [id number] Date of Birth: May 31, 1966 Referring Provider:   Flowsheet Row CARDIAC REHAB PHASE II ORIENTATION from 08/25/2023 in [facility] CARDIAC REHABILITATION  Referring Provider Odis Luster MD        Number of Visits: 36  Reason for Discharge:  Patient reached a stable level of exercise. Patient independent in their exercise. Patient has met program and personal goals.  Smoking History:  Social History   Tobacco Use  Smoking Status Never  Smokeless Tobacco Never    Diagnosis:  Status post coronary artery stent placement   Initial Exercise Prescription:  Initial Exercise Prescription - 08/25/23 0900       Date of Initial Exercise RX and Referring Provider   Date 08/25/23    Referring Provider Odis Luster MD      Oxygen   Maintain Oxygen Saturation 88% or higher      Treadmill   MPH 1.7    Grade 0    Minutes 15    METs 1.8      REL-XR   Level 2    Speed 60    Minutes 15    METs 2      Prescription Details   Frequency (times per week) 2    Duration Progress to 30 minutes of continuous aerobic without signs/symptoms of physical distress      Intensity   THRR 40-80% of Max Heartrate 127-152    Ratings of Perceived Exertion 11-13    Perceived Dyspnea 0-4      Resistance Training   Training Prescription Yes    Weight 4    Reps 10-15             Discharge Exercise Prescription (Final Exercise Prescription Changes):  Exercise Prescription Changes - 12/08/23 1300       Response to Exercise   Blood Pressure (Admit) 108/60    Blood Pressure (Exit) 112/62    Heart Rate (Admit) 112 bpm    Heart Rate (Exercise) 134 bpm    Heart Rate (Exit) 109 bpm    Rating of Perceived Exertion (Exercise) 12    Duration Continue with 30 min of aerobic exercise without signs/symptoms of physical distress.    Intensity THRR unchanged      Progression   Progression Continue to  progress workloads to maintain intensity without signs/symptoms of physical distress.      Resistance Training   Training Prescription Yes    Weight 4    Reps 10-15      Treadmill   MPH 2.4    Grade 0    Minutes 15    METs 2.84      REL-XR   Level 4    Speed 53    Minutes 15    METs 3.36      Oxygen   Maintain Oxygen Saturation 88% or higher             Functional Capacity:  6 Minute Walk     Row Name 08/25/23 0934 12/08/23 1517       6 Minute Walk   Phase Initial Discharge    Distance 1140 feet 1700 feet    Walk Time 6 minutes 6 minutes    # of Rest Breaks 0 0    MPH 2.15 3.22    METS 3.54 4.68    RPE 12 12    Perceived Dyspnea  0 0    VO2 Peak 12.41 16.38  Symptoms No No    Resting HR 101 bpm 127 bpm    Resting BP 91/66 108/60    Resting Oxygen Saturation  100 % 97 %    Exercise Oxygen Saturation  during 6 min walk 100 % 97 %    Max Ex. HR 128 bpm 130 bpm    Max Ex. BP 111/76 126/60    2 Minute Post BP 93/70 110/62             Psychological, QOL, Others - Outcomes: PHQ 2/9:    08/25/2023    9:20 AM  Depression screen PHQ 2/9  Decreased Interest 0  Down, Depressed, Hopeless 0  PHQ - 2 Score 0  Altered sleeping 0  Tired, decreased energy 1  Change in appetite 1  Feeling bad or failure about yourself  0  Trouble concentrating 0  Moving slowly or fidgety/restless 1  Suicidal thoughts 0  PHQ-9 Score 3  Difficult doing work/chores Not difficult at all     Nutrition & Weight - Outcomes:  Pre Biometrics - 08/25/23 0943       Pre Biometrics   Height 6' (1.829 m)    Weight 94.7 kg    Waist Circumference 44 inches    Hip Circumference 40 inches    Waist to Hip Ratio 1.1 %    BMI (Calculated) 28.31    Grip Strength 20.7 kg             Post Biometrics - 12/08/23 1517        Post  Biometrics   Height 6' (1.829 m)    Weight 95 kg    Waist Circumference 44 inches    Hip Circumference 39 inches    Waist to Hip Ratio 1.13 %     BMI (Calculated) 28.4    Grip Strength 20.8 kg

## 2023-12-13 NOTE — Progress Notes (Signed)
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 difficult at all   Interpretation of Total Score  Total Score Depression Severity:  1-4 = Minimal depression, 5-9 = Mild depression, 10-14 = Moderate depression, 15-19 = Moderately severe depression, 20-27 = Severe depression   Psychosocial Evaluation and Intervention:  Psychosocial Evaluation - 08/22/23 1021       Psychosocial Evaluation & Interventions   Interventions Stress management education;Relaxation education;Encouraged to exercise with the program and follow exercise prescription    Comments Patient was referred to CR with Stent Placement from [name]. He denies any depression, anxiety, or major stressors in his life. He works as a Production designer, theatre/television/film for [employer] and he says he does a lot of walking on his job. He is on hemodialysis MWF. His work is going to allow for him to come to the program on Tue/Th.  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 He has 4 children including 58 year old twins. He has a great support system with his wife, his mother and father and siblings. He used to go to the gym a lot before he started dialysis. He says he lost a lot of weight working out but has gained some of the weight back. He says he sleeps well at night. His main goal for the program is to get back into an exercise routine and start going back to the gym routinely. He also wants to get healthier overall.    Expected Outcomes Short Term: Start the program and attend consistently. Long Term: meet his personal goals.    Continue Psychosocial Services  No Follow up required             Psychosocial Re-Evaluation:  Psychosocial Re-Evaluation     Row Name 09/14/23 1146 10/04/23 1344 11/10/23 1252         Psychosocial Re-Evaluation   Current issues with Current Stress Concerns Current Stress Concerns Current Stress Concerns     Comments Trey Paula is doing well in rehab.  He is feeling good mentally but does worry about his kids a lot.  He also continues to worry about his own health issues as he is on dialysis every other day.  He usually sleeps well. Trey Paula is doing well in rehab.  He continues to stress over his 56yo twins.  He says his boy is starting to push his buttons!  He also has a 59 and 56 yo but enjoys every day he has with his kids.  He is working some and sleeps well. Trey Paula is doing well in rehab.  He enjoyed Christmas and had fun watching the twins play with their new dirt bike yesterday.  He is doing well with  dyalisis but eager to get off plavix to get back on transplant list again for his kidneys.     Expected Outcomes short: Continue to cope with his healthy issues Long: COnitnue to focus on the positives Short: Enjoy time with his kids Long: Continue to focus on the positives Short: Continue to enjoy high from Christmas fun Long: Continue to build strength for  surgery     Interventions Encouraged to attend Cardiac Rehabilitation for the exercise Encouraged to attend Cardiac Rehabilitation for the exercise  Encouraged to attend Cardiac Rehabilitation for the exercise     Continue Psychosocial Services  Follow up required by staff Follow up required by staff Follow up required by staff              Psychosocial Discharge (Final Psychosocial Re-Evaluation):  Psychosocial Re-Evaluation - 11/10/23 1252       Psychosocial Re-Evaluation   Current issues with Current Stress Concerns    Comments Trey Paula is doing well in rehab.  He enjoyed Christmas and had fun watching the twins play with their new dirt bike yesterday.  He is doing well with  dyalisis but eager to get off plavix to get back on transplant list again for his kidneys.    Expected Outcomes Short: Continue to enjoy high from Christmas fun Long: Continue to build strength for surgery    Interventions Encouraged to attend Cardiac Rehabilitation for the exercise    Continue Psychosocial Services  Follow up required by staff             Vocational Rehabilitation: Provide vocational rehab assistance to qualifying candidates.   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             Education: Education Goals: Education classes will be provided on a weekly basis, covering required topics. Participant will state understanding/return demonstration of topics presented.  Learning Barriers/Preferences:  Learning Barriers/Preferences - 08/22/23 1016       Learning Barriers/Preferences   Learning Barriers None    Learning Preferences Skilled Demonstration             Education Topics: Hypertension, Hypertension  Reduction -Define heart disease and high blood pressure. Discus how high blood pressure affects the body and ways to reduce high blood pressure. Flowsheet Row CARDIAC REHAB PHASE II EXERCISE from 12/08/2023 in [place] CARDIAC REHABILITATION  Date 12/01/23  Educator hb  Instruction Review Code 1- Verbalizes Understanding       Exercise and Your Heart -Discuss why it is important to exercise, the FITT principles of exercise, normal and abnormal responses to exercise, and how to exercise safely. Flowsheet Row CARDIAC REHAB PHASE II EXERCISE from 12/08/2023 in [place] CARDIAC REHABILITATION  Date 10/27/23  Educator HB  Instruction Review Code 1- Verbalizes Understanding       Angina -Discuss definition of angina, causes of angina, treatment of angina, and how to decrease risk of having angina.   Cardiac Medications -Review what the following cardiac medications are used for, how they affect the body, and side effects that may occur when taking the medications.  Medications include Aspirin, Beta blockers, calcium channel blockers, ACE Inhibitors, angiotensin receptor blockers, diuretics, digoxin, and antihyperlipidemics. Flowsheet Row CARDIAC REHAB PHASE II EXERCISE from 12/08/2023 in [place] CARDIAC REHABILITATION  Date 09/22/23  Educator hb  Instruction Review Code 1- Verbalizes Understanding       Congestive Heart Failure -Discuss the definition of CHF, how to live with CHF, the signs and symptoms of CHF, and how keep track of weight and sodium intake. Flowsheet Row CARDIAC REHAB PHASE II EXERCISE from 12/08/2023 in [place] CARDIAC REHABILITATION  Date 09/15/23  Educator hb  Instruction Review Code 1- Verbalizes Understanding       Heart  Disease and Intimacy -Discus the effect sexual activity has on the heart, how changes occur during intimacy as we age, and safety during sexual activity. Flowsheet Row CARDIAC REHAB PHASE II EXERCISE from 12/08/2023 in [place]  CARDIAC REHABILITATION  Date 10/20/23  Educator HB  Instruction Review Code 1- Verbalizes Understanding       Smoking Cessation / COPD -Discuss different methods to quit smoking, the health benefits of quitting smoking, and the definition of COPD.   Nutrition I: Fats -Discuss the types of cholesterol, what cholesterol does to the heart, and how cholesterol levels can be controlled. Flowsheet Row CARDIAC REHAB PHASE II EXERCISE from 12/08/2023 in [place] CARDIAC REHABILITATION  Date 11/03/23  Educator HB  Instruction Review Code 1- Verbalizes Understanding       Nutrition II: Labels -Discuss the different components of food labels and how to read food label Flowsheet Row CARDIAC REHAB PHASE II EXERCISE from 12/08/2023 in [place] CARDIAC REHABILITATION  Date 11/03/23  Educator HB  Instruction Review Code 1- Verbalizes Understanding       Heart Parts/Heart Disease and PAD -Discuss the anatomy of the heart, the pathway of blood circulation through the heart, and these are affected by heart disease. Flowsheet Row CARDIAC REHAB PHASE II EXERCISE from 12/08/2023 in [place] CARDIAC REHABILITATION  Date 10/06/23  Educator hb  Instruction Review Code 1- Verbalizes Understanding       Stress I: Signs and Symptoms -Discuss the causes of stress, how stress may lead to anxiety and depression, and ways to limit stress. Flowsheet Row CARDIAC REHAB PHASE II EXERCISE from 12/08/2023 in [place] CARDIAC REHABILITATION  Date 09/08/23  Educator HB  Instruction Review Code 1- Verbalizes Understanding       Stress II: Relaxation -Discuss different types of relaxation techniques to limit stress.   Warning Signs of Stroke / TIA -Discuss definition of a stroke, what the signs and symptoms are of a stroke, and how to identify when someone is having stroke.   Knowledge Questionnaire Score:  Knowledge Questionnaire Score - 08/22/23 1020       Knowledge Questionnaire Score    Pre Score 22/26             Core Components/Risk Factors/Patient Goals at Admission:  Personal Goals and Risk Factors at Admission - 08/22/23 1017       Core Components/Risk Factors/Patient Goals on Admission    Weight Management Obesity    Hypertension Yes    Intervention Provide education on lifestyle modifcations including regular physical activity/exercise, weight management, moderate sodium restriction and increased consumption of fresh fruit, vegetables, and low fat dairy, alcohol moderation, and smoking cessation.;Monitor prescription use compliance.    Expected Outcomes Short Term: Continued assessment and intervention until BP is < 140/71mm HG in hypertensive participants. < 130/76mm HG in hypertensive participants with diabetes, heart failure or chronic kidney disease.;Long Term: Maintenance of blood pressure at goal levels.    Lipids Yes    Intervention Provide education and support for participant on nutrition & aerobic/resistive exercise along with prescribed medications to achieve LDL 70mg , HDL >40mg .    Expected Outcomes Short Term: Participant states understanding of desired cholesterol values and is compliant with medications prescribed. Participant is following exercise prescription and nutrition guidelines.;Long Term: Cholesterol controlled with medications as prescribed, with individualized exercise RX and with personalized nutrition plan. Value goals: LDL < 70mg , HDL > 40 mg.             Core Components/Risk Factors/Patient Goals  Review:   Goals and Risk Factor Review     Row Name 09/14/23 1307 10/04/23 1348 11/10/23 1255         Core Components/Risk Factors/Patient Goals Review   Personal Goals Review Weight Management/Obesity;Hypertension Weight Management/Obesity;Hypertension Weight Management/Obesity;Hypertension     Review Trey Paula is doing well in rehab.  His weight is staying steady at 93.5 kg and he continues to keep a close eye on it at dialysis as well.  He has also been watching his blood pressures.  They have been good in class and at dialysis. Trey Paula is doing well in rehab.  He has been maintaining his weight but would like to lose a couple of pounds.  He is feeling good on dialysis and not too dry.  His pressures continue to do well. Trey Paula is doing well in rehab.  His weight is doing well.  Pressures are maintaining and he is feeling good overall.  He is eager to get off plavix and back on transplant list.     Expected Outcomes short: Continue to keep eye on blood pressure Long: Continue to monitor risk factors Short: Conitnue to work on weight loss long: Continue to monitor risk factors. Short: Conitnue to work on weight loss long: Continue to monitor risk factors.              Core Components/Risk Factors/Patient Goals at Discharge (Final Review):   Goals and Risk Factor Review - 11/10/23 1255       Core Components/Risk Factors/Patient Goals Review   Personal Goals Review Weight Management/Obesity;Hypertension    Review Trey Paula is doing well in rehab.  His weight is doing well.  Pressures are maintaining and he is feeling good overall.  He is eager to get off plavix and back on transplant list.    Expected Outcomes Short: Conitnue to work on weight loss long: Continue to monitor risk factors.             ITP Comments:  ITP Comments     Row Name 08/25/23 1191 08/30/23 1047 09/21/23 0808 10/19/23 1122 11/15/23 1427   ITP Comments Patient arrived for 1st visit/orientation/education at 0830. Patient was referred to CR by Dr. Odis Luster due to S/P Stent Placement. During orientation advised patient on arrival and appointment times what to wear, what to do before, during and after exercise. Reviewed attendance and class policy.  Pt is scheduled to return Cardiac Rehab on 08/30/23  at 1030. Pt was advised to come to class 15 minutes before class starts.  Discussed RPE/Dpysnea scales. Patient participated in warm up stretches. Patient was  able to complete 6 minute walk test.  Telemetry: AFib. Patient was measured for the equipment. Discussed equipment safety with patient. Took patient pre-anthropometric measurements. Patient finished visit at 1035. First full day of exercise!  Patient was oriented to gym and equipment including functions, settings, policies, and procedures.  Patient's individual exercise prescription and treatment plan were reviewed.  All starting workloads were established based on the results of the 6 minute walk test done at initial orientation visit.  The plan for exercise progression was also introduced and progression will be customized based on patient's performance and goals. 30 day review completed. ITP sent to Dr. Dina Rich, Medical Director of Cardiac Rehab. Continue with ITP unless changes are made by physician. 30 day review completed. ITP sent to Dr. Dina Rich, Medical Director of Cardiac Rehab. Continue with ITP unless changes are made by physician. 30 day review completed. ITP sent to  Dr. Dina Rich, Medical Director of Cardiac Rehab. Continue with ITP unless changes are made by physician.    Row Name 12/13/23 1206           ITP Comments Milburn graduated today from  rehab with 36 sessions completed.  Details of the patient's exercise prescription and what He needs to do in order to continue the prescription and progress were discussed with patient.  Patient was given a copy of prescription and goals.  Patient verbalized understanding. Derrian plans to continue to exercise by walking at home and doing his chair exercises on dialysis days.                Comments: Discharge ITP

## 2023-12-13 NOTE — Progress Notes (Signed)
[name] graduated today from  rehab with 36 sessions completed.  Details of the patient's exercise prescription and what He needs to do in order to continue the prescription and progress were discussed with patient.  Patient was given a copy of prescription and goals.  Patient verbalized understanding. [name] plans to continue to exercise by walking at home and doing his chair exercises on dialysis days.

## 2023-12-13 NOTE — Progress Notes (Signed)
Daily Session Note  Patient Details  Name: Benjaman Artman MRN: [id number] Date of Birth: 1966/03/19 Referring Provider:   Flowsheet Row CARDIAC REHAB PHASE II ORIENTATION from 08/25/2023 in [facility]  Referring Provider Odis Luster MD       Encounter Date: 12/13/2023  Check In:  Session Check In - 12/13/23 1030       Check-In   Supervising physician immediately available to respond to emergencies See telemetry face sheet for immediately available MD    Location [facility]    Staff Present Terrance Mass, RN;Heather Fredric Mare, BS, Exercise Physiologist;Laureen Manson Passey, BS, RRT, CPFT;Jessica [place], MA, RCEP, CCRP, CCET;Deedee Lybarger, RN    Virtual Visit No    Medication changes reported     No    Fall or balance concerns reported    No    Warm-up and Cool-down Performed on first and last piece of equipment    Resistance Training Performed Yes    VAD Patient? No    PAD/SET Patient? No      Pain Assessment   Currently in Pain? No/denies    Multiple Pain Sites No             Capillary Blood Glucose: No results found for this or any previous visit (from the past 24 hours).    Social History   Tobacco Use  Smoking Status Never  Smokeless Tobacco Never    Goals Met:  Independence with exercise equipment Exercise tolerated well No report of concerns or symptoms today Strength training completed today  Goals Unmet:  Not Applicable  Comments: Pt able to follow exercise prescription today without complaint.  Will continue to monitor for progression.

## 2023-12-15 ENCOUNTER — Encounter (HOSPITAL_COMMUNITY): Payer: Medicare Other

## 2023-12-20 ENCOUNTER — Encounter (HOSPITAL_COMMUNITY): Payer: Medicare Other

## 2023-12-22 ENCOUNTER — Encounter (HOSPITAL_COMMUNITY): Payer: Medicare Other

## 2023-12-27 ENCOUNTER — Encounter (HOSPITAL_COMMUNITY): Payer: Medicare Other
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
## 2024-02-21 ENCOUNTER — Ambulatory Visit: Admitting: Internal Medicine

## 2024-02-21 ENCOUNTER — Encounter: Payer: Self-pay | Admitting: Internal Medicine

## 2024-02-21 VITALS — BP 90/58 | HR 87 | Ht 73.0 in | Wt 210.8 lb

## 2024-02-21 DIAGNOSIS — G4733 Obstructive sleep apnea (adult) (pediatric): Secondary | ICD-10-CM

## 2024-02-21 DIAGNOSIS — Z992 Dependence on renal dialysis: Secondary | ICD-10-CM

## 2024-02-21 DIAGNOSIS — N186 End stage renal disease: Secondary | ICD-10-CM

## 2024-02-21 DIAGNOSIS — Z87891 Personal history of nicotine dependence: Secondary | ICD-10-CM

## 2024-02-21 NOTE — Patient Instructions (Signed)
 Order- schedule [facility] [place],  dx OSA   Please call us for results about 2 weeks after your sleep test.  Depending on the results, we may order replacement of your old machine

## 2024-03-09 ENCOUNTER — Encounter: Payer: Self-pay | Admitting: Internal Medicine

## 2024-03-28 ENCOUNTER — Ambulatory Visit (HOSPITAL_BASED_OUTPATIENT_CLINIC_OR_DEPARTMENT_OTHER): Attending: Internal Medicine | Admitting: Internal Medicine

## 2024-03-28 DIAGNOSIS — G4733 Obstructive sleep apnea (adult) (pediatric): Secondary | ICD-10-CM | POA: Insufficient documentation

## 2024-03-31 DIAGNOSIS — G4733 Obstructive sleep apnea (adult) (pediatric): Secondary | ICD-10-CM | POA: Diagnosis not present

## 2024-03-31 NOTE — Procedures (Signed)
 [name] [facility] [address] [place], [place] [address] Tel: [phone]   Fax: [phone]  Split Night Interpretation  Patient Name:  [name] Date:  03/28/2024 Referring Physician:  Rosa [employer], [place]  Indications for Polysomnography The patient is a 58 year old Male who is 6\' 1"  and weighs 209.0 lbs.  His BMI equals 27.7.  A diagnostic polysomnogram was performed to evaluate for -OSA.  After 145.0 minutes of sleep time the patient exhibited sufficient respiratory events qualifying him for a CPAP trial which was then initiated.    Medication was taken at 9:00pm  Zyloprim  Pacerone   Aspirin    Lipitor   Plavix   Pepcid   Flomax    Polysomnogram Data A full night polysomnogram was performed recording the standard physiologic parameters including EEG, EOG, EMG, EKG, nasal and oral airflow.  Respiratory parameters of chest and abdominal movements are recorded with Pe[place]-Crystal motion transducers.  Oxygen saturation was recorded by pulse oximetry.    Sleep Architecture The total recording time of the diagnostic portion of the study was 230.8 minutes.  The total sleep time was 145.0 minutes.  During the diagnostic portion of the study, the patient spent 10.0% of total sleep time in Stage N1, 90.0% in Stage N2, 0.0% in Stages N3, and 0.0% in REM.   Sleep latency was 3.4 minutes.  REM latency was - minutes.  Sleep Efficiency was 62.8%.  Wake after Sleep Onset time was 82.5 minutes.   At 01:54:41 AM the patient was placed on PAP treatment and was titrated at pressures ranging from 6* cm/H20 with supplemental oxygen at - up to 11* cm/H20 with supplemental oxygen at -.  The total recording time of the treatment portion of the study was 193.5 minutes.  The total sleep time was 159.0 minutes.  During the treatment portion of the study, the patient spent 4.1% of total sleep time in Stage N1, 64.5% in Stage N2, 4.4% in Stages N3, and 27.0% in REM.    Sleep latency was 10.0 minutes.  REM latency was 30.5 minutes.  Sleep Efficiency was 82.2%.  Wake after Sleep Onset time was 24.0 minutes.  Respiratory Events During the diagnostic portion of the study, the polysomnogram revealed a presence of 2 obstructive, - central, and - mixed apneas resulting in an Apnea index of 0.8 events per hour.  There were 39 hypopneas (>=3% desaturation and/or arousal) resulting in an Apnea\Hypopnea Index (AHI >=3% desaturation and/or arousal) of 17.0 events per hour.  There were 10 hypopneas (>=4% desaturation) resulting in an Apnea\Hypopnea Index (AHI >=4% desaturation) of 5.0 events per hour.  There were 2 Respiratory Effort Related Arousals resulting in a RERA index of 0.8 events per hour. The Respiratory Disturbance Index is 17.8 events per hour.  The snore index was - events per hour.  Mean oxygen saturation was 94.3%.  The lowest oxygen saturation during sleep was 65.0%.  Time spent <=88% oxygen saturation was 33.1 minutes (18.7%).  End Tidal CO2 during sleep ranged from - to - mmHg. End Tidal CO2 was greater than 50 mmHg for - minutes and greater than 55 mmHg for - minutes.  During the treatment portion of the study, the polysomnogram revealed a presence of - obstructive, - central, and - mixed apneas resulting in an Apnea index of - events per hour.  There were 7 hypopneas (>=3% desaturation and/or arousal) resulting in an Apnea\Hypopnea Index (AHI >=3% desaturation and/or arousal) of 2.6 events per hour.  There were 3  hypopneas (>=4% desaturation) resulting in an Apnea\Hypopnea Index (AHI >=4% desaturation) of 1.1 events per hour.  There were - Respiratory Effort Related Arousals resulting in a RERA index of - events per hour. The Respiratory Disturbance Index is 2.6 events per hour.  The snore index was - events per hour.  Mean oxygen saturation was 95.2%.  The lowest oxygen saturation during sleep was 55.0%.  Time spent <=88% oxygen saturation was 25.3 minutes  (14.9%).  Limb Activity During the diagnostic portion of the study, there were 244 limb movements recorded.  Of this total, 241 were classified as PLMs.  Of the PLMs, 18 were associated with arousals.  The Limb Movement index was 101.0 per hour while the PLM index was 99.7 per hour.  During the treatment portion of the study, there were 277 limb movements recorded.  Of this total, 273 were classified as PLMs.  Of the PLMs, 30 were associated with arousals.  The Limb Movement index was 104.5 per hour while the PLM index was 103.0 per hour.  Cardiac Summary During the diagnostic portion of the study, the average pulse rate was 80.2 bpm.  The minimum pulse rate was 40.0 bpm while the maximum pulse rate was 116.0 bpm. Occasional PVC.  During the treatment portion of the study, the average pulse rate was 70.5 bpm.  The minimum pulse rate was 57.0 bpm while the maximum pulse rate was 117.0 bpm.   Comment: Moderate obstructive sleep apnea, AHI (3%) 17/hr. Snoring with oxygen desaturation to a nadir of 65%, mean 94%. CPAP titration to 11 cwp with residual AHI 0/hr, minimum O2 saturation 70%, average 95.9%. Periodic Limb Movement.  Diagnosis: Obstructive sleep apnea  Recommendations: Suggest CPAP titration sleep study or autopap.   This study was personally reviewed and electronically signed by: Rosa [employer], [place] Accredited Board Certified in Sleep Medicine Date/Time: 03/31/24   12:22       Split Night Report  Patient Name: [name], [name] Date: 03/28/2024  Date of Birth: [birth year] Study Type: Split Night  Age: 58 year MRN #: [id number]  Sex: Male Interpreting Physician: Rosa College [id number]  Height: 6\' 1"  Referring Physician: Rosa [employer], Md  Weight: 209.0 lbs Recording Tech: [name] RPSGT RST  BMI: 27.7 Scoring Tech: [name] RPSGT RST  ESS: 1 Neck Size: 16  Mask Type Simplus Final Pressure: 11 CM H2O  Mask Size: Large Supplemental O2: -   Study  Overview  DIAGNOSTIC TREATMENT  Lights Off: 10:03:22 PM Lights Off: 01:54:13 AM  Lights On: 01:54:13 AM Lights On: 05:07:41 AM  Time in Bed: 230.8 min. Time in Bed: 193.5 min.  Total Sleep Time: 145.0 min. Total Sleep Time: 159.0 min.  Sleep Efficiency: 62.8% Sleep Efficiency: 82.2%  Sleep Latency: 3.4 min. Sleep Latency: 10.0 min.  REM Latency from Sleep Onset: - min. REM Latency from Sleep Onset: 30.5 min.  Wake After Sleep Onset: 82.5 min. Wake After Sleep Onset: 24.0 min.   DIAGNOSTIC TREATMENT   Count Index  Count Index  Awakenings: 21 8.7 Awakenings: 14 5.3  Arousals: 51 21.1 Arousals: 48 18.1  AHI (>=3% Desat and/or Ar.): 41 17.0 AHI (>=3% Desat and/or Ar.): 7 2.6  AHI (>=4% Desat): 12 5.0 AHI (>=4% Desat): 3 1.1   Limb Movements: 244 101.0 Limb Movements: 277 104.5  Snore: - - Snore: - -  Desaturations: 128 53.0 Desaturations: 27 10.2  Minimum SpO2 TST: 65.0% Minimum SpO2 TST: 55.0%    Sleep Architecture   DIAGNOSTIC TREATMENT ENTIRE NIGHT  Stages Time (mins) % Sleep Time Time (mins) % Sleep Time Time (mins) % Sleep Time  Wake 86.0  34.5  120.5   Stage N1 14.5 10.0% 6.5 4.1% 21.0 6.9%  Stage N2 130.5 90.0% 102.5 64.5% 233.0 76.6%  Stage N3 0.0 0.0% 7.0 4.4% 7.0 2.3%  REM 0.0 0.0% 43.0 27.0% 43.0 14.1%   Arousal Summary   DIAGNOSTIC TREATMENT   NREM REM TST Index NREM REM TST Index  Respiratory Ar. 18 - 18 7.4 1 - 1 0.4  PLM Ar. 18 - 18 7.4 30 - 30 11.3  Isolated Limb Movement Ar. - - - - 2 - 2 0.8  Snore Ar. - - - - - - - -  Spontaneous Ar. 17 - 17 7.0 11 6 17  6.4  Total Ar. 51 - 51 21.1 42 6 48 18.1    Respiratory Summary  DIAGNOSTIC By Sleep Stage By Body Position Total   NREM REM Supine Non-Supine   Time (min) 145.0 0.0 81.0 64.0 145.0         Obstructive Apnea 2 - 2 - 2  Mixed Apnea - - - - -  Central Apnea - - - - -  Total Apneas 2 - 2 - 2  Total Apnea Index 0.8 - 1.5 - 0.8         Hypopneas (>=3% Desat and/or Ar.) 39 - 38 1 39  AHI (>=3% Desat  and/or Ar.) 17.0 - 29.6 0.9 17.0         Hypopneas (>=4% Desat) 10 - 9 1 10   AHI (>=4% Desat) 5.0 - 8.1 0.9 5.0          RERAs 2 - 1 1 2   RERA Index 0.8 - 0.7 0.9 0.8         RDI 17.8 - 30.4 1.9 17.8    TREATMENT By Sleep Stage By Body Position Total   NREM REM Supine Non-Supine   Time (min) 116.0 43.0 72.5 86.5 159.0         Obstructive Apnea - - - - -  Mixed Apnea - - - - -  Central Apnea - - - - -  Total Apneas - - - - -  Total Apnea Index - - - - -         Hypopneas (>=3% Desat and/or Ar.) 4 3 6 1 7   AHI (>=3% Desat and/or Ar.) 2.1 4.2 5.0 0.7 2.6         Hypopneas (>=4% Desat) 1 2 2 1 3   AHI (>=4% Desat) 0.5 2.8 1.7 0.7 1.1          RERAs - - - - -  RERA Index - - - - -         RDI 2.1 4.2 5.0 0.7 2.6    Respiratory Event Durations   DIAGNOSTIC TREATMENT  Apnea NREM REM NREM REM  Average (seconds) 12.4 - - -  Maximum (seconds) 12.9 - - -  Hypopnea      Average (seconds) 16.2 - 18.3 27.1  Maximum (seconds) 23.7 - 20.4 30.1    Limb Movement Summary   DIAGNOSTIC TREATMENT   Count Index Count Index  Isolated Limb Movements 3 1.2 4 1.5  Periodic Limb Movements (PLMs) 241 99.7 273 103.0  Total Limb Movements 244 101.0 277 104.5    Oxygen Saturation Summary   DIAGNOSTIC TREATMENT   Wake NREM REM TST Wake NREM REM TST  Average SpO2 96.6% 93.5% - 93.5% 97.5% 93.9% 98.5%  94.8%  Minimum SpO2 73.0% 65.0% - 65.0%  70.0% 55.0% 85.0% 55.0%   Maximum SpO2 100.0% 100.0% - 100.0%  100.0% 100.0% 100.0% 100.0%    DIAGNOSTIC Oxygen Saturation Distribution  Range (%) Time in range (min) Time in range (%)   90.0 - 100.0 132.4 74.6%  80.0 - 90.0 33.9 19.1%  70.0 - 80.0 5.4 3.1%  60.0 - 70.0 1.3 0.7%  50.0 - 60.0 - -  0.0 - 50.0 - -  Time Spent <=88% SpO2  Range (%) Time in range (min) Time in range (%)  0.0 - 88.0 33.1 18.7%      Count Index  Desaturations: 128 53.0   TREATMENT Oxygen Saturation Distribution  Range (%) Time in range (min) Time in range  (%)   90.0 - 100.0 137.3 80.9%  80.0 - 90.0 8.4 5.0%  70.0 - 80.0 6.7 4.0%  60.0 - 70.0 9.6 5.7%  50.0 - 60.0 1.5 0.9%  0.0 - 50.0 - -  Time Spent <=88% SpO2  Range (%) Time in range (min) Time in range (%)  0.0 - 88.0 25.3 14.9%      Count Index  Desaturations: 27 10.2     Cardiac Summary   DIAGNOSTIC TREATMENT   Wake NREM REM Total Wake NREM REM Total  Average Pulse Rate (BPM) 70.7 83.4 - 80.2 87.2 64.7 78.0 70.5  Minimum Pulse Rate (BPM) 40.0 58.0 - 40.0 60.0 57.0 58.0 57.0  Maximum Pulse Rate (BPM) 115.0 116.0 - 116.0 117.0 117.0 117.0 117.0   Pulse Rate Distribution   DIAGNOSTIC  Range (bpm) Time in range (min) Time in range (%)  0.0 - 40.0 0.1 0.1%  40.0 - 60.0 6.6 3.7%  60.0 - 80.0 112.8 64.1%  80.0 - 100.0 1.1 0.6%  100.0 - 120.0 55.4 31.4%  120.0 - 140.0 - -  140.0 - 200.0 - -   TREATMENT  Range (bpm) Time in range (min) Time in range (%)  0.0 - 40.0 - -  40.0 - 60.0 1.1 0.6%  60.0 - 80.0 144.3 85.9%  80.0 - 100.0 0.9 0.5%  100.0 - 120.0 21.5 12.8%  120.0 - 140.0 - -  140.0 - 200.0 - -   EtCO2 Summary - Diagnostic  Stage Min (mmHg) Average (mmHg) Max (mmHg)  Wake - - -  NREM(1+2+3) - - -  REM - - -   EtCO2 Distribution:  Range (mmHg) Time in range (min) Time in range (%)  20.0 - 40.0 - -  40.0 - 50.0 - -  50.0 - 100.0 - -  55.0 - 100.0 - -  Excluded data <20.0 & >65.0 231.0 100.0%   Titration Summary  PAP Device PAP Level O2 Level Time (min) Wake (min) NREM (min) REM (min) Sleep Eff% OA# CA# MA# Hyp# (>=3%) AHI (>=3%) Hyp# (>=4%) AHI (>=%4) RERA RDI OSat <=88% (min) Min [employer] Ar. Index  - Off - 231.0 86.0 145.0 0.0 62.8% 2 - - 39 17.0 10  5.0 2  17.8  28.5 65.0 93.5 21.1  CPAP 6 - 50.0 13.0 27.5 9.5 74.0% - - - 3 4.9 2  3.2 -  4.9  0.0 95.0 99.0 11.4  CPAP 8 - 9.0 2.0 5.5 1.5 77.8% - - - 2 17.1 -  - -  17.1  0.0 96.0 98.7 51.4  CPAP 9 - 75.0 4.0 71.0 0.0 94.7% - - - 2 1.7 1  0.8 -  1.7  16.3 55.0 91.7 17.7  CPAP 11 -  59.5 15.5  12.0 32.0 73.9% - - - - - -  - -  -  6.5 70.0 95.9 19.1    Hypnograms                           Technologist Comments  Patient was at the Sleep Lab for osa.  A Split Night Study was ordered.  Patient met split night protocol.  Patient was fitted with a large Simplus FFM.  CPAP pressure started at 6 CM H2O and titrated to CPAP pressure of 11 CM H2O with heated humidity and heated tubbing.  Patient tolerated Cpap pressure well.  Respiratory events were eliminated.  Snoring was eliminated.  Periodic Limb Movement was severe with/without arousals.  EKG showed nsr with abnormal rhythm throughout the study.  Patient took his medication at 9 pm.  No oxygen was applied. No restroom visited.  Tec was in and out of patient room due to patient's poor perfusion of SAO2.  Tech tried several finger probes and different body areas to maintain a reading.                          [employer] Diplomate, Biomedical engineer of Sleep Medicine  ELECTRONICALLY SIGNED ON:  03/31/2024, 12:16 PM [facility] PH: [phone]   FX: [phone] ACCREDITED BY THE AMERICAN ACADEMY OF SLEEP MEDICINE

## 2024-05-08 ENCOUNTER — Encounter (HOSPITAL_BASED_OUTPATIENT_CLINIC_OR_DEPARTMENT_OTHER): Admitting: Internal Medicine

## 2024-05-22 ENCOUNTER — Ambulatory Visit: Admitting: Internal Medicine
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
## 2024-07-03 ENCOUNTER — Ambulatory Visit (INDEPENDENT_AMBULATORY_CARE_PROVIDER_SITE_OTHER): Admitting: Internal Medicine

## 2024-07-03 ENCOUNTER — Encounter: Payer: Self-pay | Admitting: Internal Medicine

## 2024-07-03 VITALS — BP 75/49 | HR 63 | Temp 98.2°F | Ht 72.0 in | Wt 212.8 lb

## 2024-07-03 DIAGNOSIS — G4733 Obstructive sleep apnea (adult) (pediatric): Secondary | ICD-10-CM

## 2024-07-03 NOTE — Patient Instructions (Signed)
 Order- DME [facility] in [place], new CPAP machine auto 5-20, mask of choice, humidifier, supplies, AirView/ card.  Please call if we can help

## 2024-07-25 ENCOUNTER — Telehealth: Payer: Self-pay

## 2024-07-25 NOTE — Telephone Encounter (Signed)
 Copied from CRM [id number]. Topic: Clinical - Order For Equipment >> Jul 25, 2024  8:47 AM [name] PARAS wrote: Reason for CRM:   Pt is contacting clinic regarding order for CPAP supplies. He spoke with [facility] today and they are reporting they have not received order. He provided an alternate fax # to send order to, stating the order may have been sent to the wrong #  Pt requesting order be sent to new fax #:  FX#  [phone]  CB#  [phone] (pt)   Referral from 8/19 is closed. Can we look into this? Thanks!

## 2024-07-25 NOTE — Telephone Encounter (Signed)
 I have resent to [facility] as urgent

## 2024-07-30 NOTE — Telephone Encounter (Signed)
-----   Message from [place] sent at 07/24/2024 12:40 PM EDT ----- Regraft - CTA looks good. BTC - review CTA; kidney on the left. Review CT comment r/t cirrhosis. Does patient still require midodrine ?  [name]

## 2024-08-03 NOTE — Progress Notes (Signed)
 ------------------------------ BASIC NOTE ------------------------------  Patient: [name] DOB: May 13, 1966 MRN: [id number] Note Author: WATT TAMRA SAILOR, MD Service Date: 08/03/2024  This patient was personally seen face-to-face for a basic visit as part of routine monthly dialysis care for end stage renal disease.  Attending Nephrologist: WATT TAMRA SAILOR Dialysis Location: [facility] Schedule: M-W-F Shift: 1  ------------------------------ OVERVIEW ------------------------------  COMMENTS: I have spoken with patient today during HD, no complaints, s/p PCI w/ stentingx3 ; completed cardiac rehab [name] 12/13/23. He is taking Amiodarone  200 mg qhs, Metoprolol  Succinate 25 mg qd ND, Left AVF in use for dialysis, no access issues reported, Kt/v 1.5 he complains of left hand being cold, denies any numbness or bleeding , advised warm glove, staff advised closely monitor for now, may need referral to [name], has he has not been seen in >25yr.   PATIENT HISTORY COMMENTS: Recent hx PCI w/stent x3 , at [facility] 07/05/23 , now on Metoprolol  25 BID , Plavix  75qd, he has completed cardiac rehab w/ [name]  ------------------------------ HOME MEDICATIONS ------------------------------  Current [facility] Outpatient Medications ------------------------------------------ tadalafil (Cialis) 10 MG tablet Take 2 tablets (20 mg total) by mouth 1 (one) time each day if needed for erectile dysfunction Start Date: 02/04/2021  Current [facility] Allergies ----------------------------- Allergen: Not on File  ------------------------------ DIALYSIS PRESCRIPTION ------------------------------  COMMENTS: no changes, fluid restrictions advised  Treatment Data -------------- EDW (kg): 93.5  ------------------------------ BP AND FLUID ASSESSMENT ------------------------------  COMMENTS: takes Midodrine    ------------------------------ ACCESS  ASSESSMENT ------------------------------  Current Access: AVF  COMMENTS: Left AVF- in use for dialysis, no access issues reported, good adequacy kt/v 1.5  ------------------------------ ADDITIONAL COMMENT ------------------------------  COMMENTS: 03/21/2024:  Patient seen and spoke to during comprehensive rounds. pt seeing a new cardiologist and starting amiodarone , getting LFT and Thyroid func periodically for the cardiologist. Calcium  is gone down to 6.6, temporarily increase the Tums to 2 at nighttime. Having issues of cramping, increase target weight to 95. Has had pretransplant evaluation follow-up again yesterday and awaiting to become active based upon their monthly meeting soon. Phosphorus 5.6 on Auryxia  2 to 3 with each meal. On midodrine  for intradialytic hypotension. Off cardiac rehab, exercise tolerance much better .URR 72, potassium 4.2, hemoglobin 12.7  03/28/2024: Patient seen and spoke to during basic rounds. Labs reviewed, Auryxia  2 with each meal, phosphorus 5.8. Patient has missed some binders. Compliance reinforced. Calcium  6.8 recommend to take 2 Tums at nighttime. PTH pending. Hemoglobin 13.1, albumin  3.8 and potassium 4.3.  04/23/2024:  Patient seen and spoke to during comprehensive rounds. Monthly labs so far pending. Last phosphorus was 5.8 and PTH was 49 in May. Awaiting final cardiac imaging and possibly CT of the abdomen to be placed back on the transplant list.  6.16.25: Stable  6.25.25: Stable  6.27.25: Patient seen And spoke to during comprehensive rounds. Vital signs and vol assessment reviewed. Stable, no new c/o  05/16/24 :  Pt seen and spoke to during comp rounds. small skin breakdown right great toe. started on doxy 2 days ago. apt with podiatry pending. labs pending. is on auryxia  , midodrine  and amiodarone   06/13/24 :  Pt seen and spoke to during basic rounds. Has had a spot on the left foot seeing [facility] in [place]  seen  them yesterday. Calcium  has improved from 6.3 to 7.8 and he takes two at night time and he's on a higher calcium  bath. Is on inactive transplant list waiting for stress test again  06/20/2024 :  Patient seen and spoke to during comprehensive rounds. All labs from July reviewed. Target weight, blood pressures reviewed as well. Monthly labs from August pending. Access working ok. left foot wound, following with wound care.  07/06/2024:  Patient seen and spoke to during basic rounds. Dialysis treatment stable. had a good trip at the beach. doing ok so far   07/11/2024 :  Patient seen and spoke to during basic rounds. Overall stable. some symptoms of heartburn, he is on Pepcid , recommend to restart Protonix  as well. Has stress test scheduled at [facility] tomorrow.  07/25/2024:  Patient seen and spoke to during comprehensive rounds. Has the toenail in his left great toe has come off from the base and there appears to be some drainage yellowish beneath that as well, given his previous history of calciphylaxis and foot lesions, will send out 100 mg twice daily of doxycycline  for 10 days. Patient has appointment with podiatry in the ED tomorrow as well.  08/03/2024 :  Pt seen and spoke to during basic rounds. BP and Wt reviewed. overall stable., no new issues , toe nail removed by podiatrist. finishing up Doxy. Txp listing at [name] delayed due to Insurance issues .   Signed by: WATT TAMRA SAILOR, MD on 08/03/2024 at 90:93:75 PM Transcribed by: WATT TAMRA SAILOR, MD on 08/03/2024 at 90:93:75 PM

## 2024-08-08 NOTE — Progress Notes (Signed)
 ------------------------------ BASIC NOTE ------------------------------  Patient: [name] DOB: Aug 25, 1966 MRN: [id number] Note Author: WATT TAMRA SAILOR, MD Service Date: 08/08/2024  This patient was personally seen face-to-face for a basic visit as part of routine monthly dialysis care for end stage renal disease.  Attending Nephrologist: WATT TAMRA SAILOR Dialysis Location: [facility] Schedule: M-W-F Shift: 1  ------------------------------ OVERVIEW ------------------------------  COMMENTS: I have spoken with patient today during HD, no complaints, s/p PCI w/ stentingx3 ; completed cardiac rehab [name] 12/13/23. He is taking Amiodarone  200 mg qhs, Metoprolol  Succinate 25 mg qd ND, Left AVF in use for dialysis, no access issues reported, Kt/v 1.5 he complains of left hand being cold, denies any numbness or bleeding , advised warm glove, staff advised closely monitor for now, may need referral to [facility], has he has not been seen in >77yr.   PATIENT HISTORY COMMENTS: Recent hx PCI w/stent x3 , at [facility] 07/05/23 , now on Metoprolol  25 BID , Plavix  75qd, he has completed cardiac rehab w/ [name]  ------------------------------ HOME MEDICATIONS ------------------------------  Current [facility] Outpatient Medications ------------------------------------------ tadalafil (Cialis) 10 MG tablet Take 2 tablets (20 mg total) by mouth 1 (one) time each day if needed for erectile dysfunction Start Date: 02/04/2021  Current [facility] Allergies ----------------------------- Allergen: Not on File  ------------------------------ DIALYSIS PRESCRIPTION ------------------------------  COMMENTS: no changes, fluid restrictions advised  Treatment Data -------------- EDW (kg): 93.5  ------------------------------ BP AND FLUID ASSESSMENT ------------------------------  COMMENTS: takes Midodrine    ------------------------------ ACCESS  ASSESSMENT ------------------------------  Current Access: AVF  COMMENTS: Left AVF- in use for dialysis, no access issues reported, good adequacy kt/v 1.5  ------------------------------ ADDITIONAL COMMENT ------------------------------  COMMENTS: 03/21/2024:  Patient seen and spoke to during comprehensive rounds. pt seeing a new cardiologist and starting amiodarone , getting LFT and Thyroid func periodically for the cardiologist. Calcium  is gone down to 6.6, temporarily increase the Tums to 2 at nighttime. Having issues of cramping, increase target weight to 95. Has had pretransplant evaluation follow-up again yesterday and awaiting to become active based upon their monthly meeting soon. Phosphorus 5.6 on Auryxia  2 to 3 with each meal. On midodrine  for intradialytic hypotension. Off cardiac rehab, exercise tolerance much better .URR 72, potassium 4.2, hemoglobin 12.7  03/28/2024: Patient seen and spoke to during basic rounds. Labs reviewed, Auryxia  2 with each meal, phosphorus 5.8. Patient has missed some binders. Compliance reinforced. Calcium  6.8 recommend to take 2 Tums at nighttime. PTH pending. Hemoglobin 13.1, albumin  3.8 and potassium 4.3.  04/23/2024:  Patient seen and spoke to during comprehensive rounds. Monthly labs so far pending. Last phosphorus was 5.8 and PTH was 49 in May. Awaiting final cardiac imaging and possibly CT of the abdomen to be placed back on the transplant list.  6.16.25: Stable  6.25.25: Stable  6.27.25: Patient seen And spoke to during comprehensive rounds. Vital signs and vol assessment reviewed. Stable, no new c/o  05/16/24 :  Pt seen and spoke to during comp rounds. small skin breakdown right great toe. started on doxy 2 days ago. apt with podiatry pending. labs pending. is on auryxia  , midodrine  and amiodarone   06/13/24 :  Pt seen and spoke to during basic rounds. Has had a spot on the left foot seeing [facility] in [place]  seen  them yesterday. Calcium  has improved from 6.3 to 7.8 and he takes two at night time and he's on a higher calcium  bath. Is on inactive transplant list waiting for stress test again  06/20/2024 :  Patient seen and spoke to during comprehensive rounds. All labs from July reviewed. Target weight, blood pressures reviewed as well. Monthly labs from August pending. Access working ok. left foot wound, following with wound care.  07/06/2024:  Patient seen and spoke to during basic rounds. Dialysis treatment stable. had a [place] trip at the beach. doing ok so far   07/11/2024 :  Patient seen and spoke to during basic rounds. Overall stable. some symptoms of heartburn, he is on Pepcid , recommend to restart Protonix  as well. Has stress test scheduled at [facility] tomorrow.  07/25/2024:  Patient seen and spoke to during comprehensive rounds. Has the toenail in his left great toe has come off from the base and there appears to be some drainage yellowish beneath that as well, given his previous history of calciphylaxis and foot lesions, will send out 100 mg twice daily of doxycycline  for 10 days. Patient has appointment with podiatry in the ED tomorrow as well.  08/03/2024 :  Pt seen and spoke to during basic rounds. BP and Wt reviewed. overall stable., no new issues , toe nail removed by podiatrist. finishing up Doxy. Txp listing at [name] delayed due to Insurance issues .  08/08/2024 :  Patient successfully seen and spoke to during basic rounds. Labs reviewed. low Ca, not gotten calcitriol  at home For now, recommend to take calcitriol  1 mcg q. treatment in center and continue Tums twice a day. PTH 50, phosphorus 4.1, hemoglobin 10.4   Signed by: WATT TAMRA SAILOR, MD on 08/08/2024 at 01:21:21 PM Transcribed by: WATT TAMRA SAILOR, MD on 08/08/2024 at 01:21:21 PM

## 2024-08-12 ENCOUNTER — Other Ambulatory Visit: Payer: Self-pay

## 2024-08-12 ENCOUNTER — Inpatient Hospital Stay (HOSPITAL_COMMUNITY)
Admission: EM | Admit: 2024-08-12 | Discharge: 2024-08-17 | DRG: 539 | Disposition: A | Discharge status: Home / Self Care | Attending: Family Medicine | Admitting: Family Medicine

## 2024-08-12 ENCOUNTER — Encounter (HOSPITAL_COMMUNITY): Payer: Self-pay | Admitting: *Deleted

## 2024-08-12 ENCOUNTER — Emergency Department (HOSPITAL_COMMUNITY)

## 2024-08-12 DIAGNOSIS — K219 Gastro-esophageal reflux disease without esophagitis: Secondary | ICD-10-CM | POA: Diagnosis present

## 2024-08-12 DIAGNOSIS — Z9049 Acquired absence of other specified parts of digestive tract: Secondary | ICD-10-CM

## 2024-08-12 DIAGNOSIS — N400 Enlarged prostate without lower urinary tract symptoms: Secondary | ICD-10-CM | POA: Diagnosis present

## 2024-08-12 DIAGNOSIS — T8612 Kidney transplant failure: Secondary | ICD-10-CM | POA: Diagnosis present

## 2024-08-12 DIAGNOSIS — I132 Hypertensive heart and chronic kidney disease with heart failure and with stage 5 chronic kidney disease, or end stage renal disease: Secondary | ICD-10-CM | POA: Diagnosis present

## 2024-08-12 DIAGNOSIS — Z955 Presence of coronary angioplasty implant and graft: Secondary | ICD-10-CM

## 2024-08-12 DIAGNOSIS — I480 Paroxysmal atrial fibrillation: Secondary | ICD-10-CM | POA: Diagnosis not present

## 2024-08-12 DIAGNOSIS — E782 Mixed hyperlipidemia: Secondary | ICD-10-CM | POA: Diagnosis present

## 2024-08-12 DIAGNOSIS — M869 Osteomyelitis, unspecified: Principal | ICD-10-CM

## 2024-08-12 DIAGNOSIS — I9589 Other hypotension: Secondary | ICD-10-CM | POA: Diagnosis present

## 2024-08-12 DIAGNOSIS — Y830 Surgical operation with transplant of whole organ as the cause of abnormal reaction of the patient, or of later complication, without mention of misadventure at the time of the procedure: Secondary | ICD-10-CM | POA: Diagnosis present

## 2024-08-12 DIAGNOSIS — I5032 Chronic diastolic (congestive) heart failure: Secondary | ICD-10-CM | POA: Diagnosis present

## 2024-08-12 DIAGNOSIS — M86171 Other acute osteomyelitis, right ankle and foot: Principal | ICD-10-CM | POA: Diagnosis present

## 2024-08-12 DIAGNOSIS — R509 Fever, unspecified: Secondary | ICD-10-CM | POA: Diagnosis present

## 2024-08-12 DIAGNOSIS — Z95818 Presence of other cardiac implants and grafts: Secondary | ICD-10-CM

## 2024-08-12 DIAGNOSIS — Z8249 Family history of ischemic heart disease and other diseases of the circulatory system: Secondary | ICD-10-CM

## 2024-08-12 DIAGNOSIS — B9620 Unspecified Escherichia coli [E. coli] as the cause of diseases classified elsewhere: Secondary | ICD-10-CM | POA: Diagnosis present

## 2024-08-12 DIAGNOSIS — Z1152 Encounter for screening for COVID-19: Secondary | ICD-10-CM

## 2024-08-12 DIAGNOSIS — Z992 Dependence on renal dialysis: Secondary | ICD-10-CM

## 2024-08-12 DIAGNOSIS — I272 Pulmonary hypertension, unspecified: Secondary | ICD-10-CM | POA: Diagnosis present

## 2024-08-12 DIAGNOSIS — R7881 Bacteremia: Secondary | ICD-10-CM | POA: Diagnosis present

## 2024-08-12 DIAGNOSIS — Z86718 Personal history of other venous thrombosis and embolism: Secondary | ICD-10-CM

## 2024-08-12 DIAGNOSIS — G4733 Obstructive sleep apnea (adult) (pediatric): Secondary | ICD-10-CM | POA: Diagnosis present

## 2024-08-12 DIAGNOSIS — Z881 Allergy status to other antibiotic agents status: Secondary | ICD-10-CM

## 2024-08-12 DIAGNOSIS — R197 Diarrhea, unspecified: Secondary | ICD-10-CM | POA: Diagnosis not present

## 2024-08-12 DIAGNOSIS — D631 Anemia in chronic kidney disease: Secondary | ICD-10-CM | POA: Diagnosis present

## 2024-08-12 DIAGNOSIS — I2510 Atherosclerotic heart disease of native coronary artery without angina pectoris: Secondary | ICD-10-CM | POA: Diagnosis present

## 2024-08-12 DIAGNOSIS — N186 End stage renal disease: Secondary | ICD-10-CM | POA: Diagnosis present

## 2024-08-12 DIAGNOSIS — N2581 Secondary hyperparathyroidism of renal origin: Secondary | ICD-10-CM | POA: Diagnosis present

## 2024-08-12 DIAGNOSIS — Z7982 Long term (current) use of aspirin: Secondary | ICD-10-CM

## 2024-08-12 DIAGNOSIS — Z79899 Other long term (current) drug therapy: Secondary | ICD-10-CM

## 2024-08-12 LAB — COMPREHENSIVE METABOLIC PANEL WITH GFR
ALT: 31 U/L (ref 0–44)
AST: 40 U/L (ref 15–41)
Albumin: 3 g/dL — ABNORMAL LOW (ref 3.5–5.0)
Alkaline Phosphatase: 161 U/L — ABNORMAL HIGH (ref 38–126)
Anion gap: 17 — ABNORMAL HIGH (ref 5–15)
BUN: 60 mg/dL — ABNORMAL HIGH (ref 6–20)
CO2: 23 mmol/L (ref 22–32)
Calcium: 7.2 mg/dL — ABNORMAL LOW (ref 8.9–10.3)
Chloride: 97 mmol/L — ABNORMAL LOW (ref 98–111)
Creatinine, Ser: 11.32 mg/dL — ABNORMAL HIGH (ref 0.61–1.24)
GFR, Estimated: 5 mL/min — ABNORMAL LOW (ref >60)
Glucose, Bld: 95 mg/dL (ref 70–99)
Potassium: 3.9 mmol/L (ref 3.5–5.1)
Sodium: 137 mmol/L (ref 135–145)
Total Bilirubin: 1.1 mg/dL (ref 0.0–1.2)
Total Protein: 6.8 g/dL (ref 6.5–8.1)

## 2024-08-12 LAB — CBC WITH DIFFERENTIAL/PLATELET
Abs Immature Granulocytes: 0.03 K/uL (ref 0.00–0.07)
Basophils Absolute: 0 K/uL (ref 0.0–0.1)
Basophils Relative: 1 %
Eosinophils Absolute: 0.1 K/uL (ref 0.0–0.5)
Eosinophils Relative: 1 %
HCT: 32.2 % — ABNORMAL LOW (ref 39.0–52.0)
Hemoglobin: 10 g/dL — ABNORMAL LOW (ref 13.0–17.0)
Immature Granulocytes: 0 %
Lymphocytes Relative: 7 %
Lymphs Abs: 0.5 K/uL — ABNORMAL LOW (ref 0.7–4.0)
MCH: 30 pg (ref 26.0–34.0)
MCHC: 31.1 g/dL (ref 30.0–36.0)
MCV: 96.7 fL (ref 80.0–100.0)
Monocytes Absolute: 0.5 K/uL (ref 0.1–1.0)
Monocytes Relative: 7 %
Neutro Abs: 6.6 K/uL (ref 1.7–7.7)
Neutrophils Relative %: 84 %
Platelets: 153 K/uL (ref 150–400)
RBC: 3.33 MIL/uL — ABNORMAL LOW (ref 4.22–5.81)
RDW: 16 % — ABNORMAL HIGH (ref 11.5–15.5)
WBC: 7.9 K/uL (ref 4.0–10.5)
nRBC: 0 % (ref 0.0–0.2)

## 2024-08-12 LAB — PROTIME-INR
INR: 1.2 (ref 0.8–1.2)
Prothrombin Time: 15.8 s — ABNORMAL HIGH (ref 11.4–15.2)

## 2024-08-12 LAB — RESP PANEL BY RT-PCR (RSV, FLU A&B, COVID)  RVPGX2
Influenza A by PCR: NEGATIVE
Influenza B by PCR: NEGATIVE
Resp Syncytial Virus by PCR: NEGATIVE
SARS Coronavirus 2 by RT PCR: NEGATIVE

## 2024-08-12 LAB — LACTIC ACID, PLASMA: Lactic Acid, Venous: 1.6 mmol/L (ref 0.5–1.9)

## 2024-08-12 MED ORDER — SODIUM CHLORIDE 0.9 % IV SOLN
3.0000 g | Freq: Once | INTRAVENOUS | Status: AC
  Administered 2024-08-12: 3 g via INTRAVENOUS
  Filled 2024-08-12: qty 8

## 2024-08-12 MED ORDER — ONDANSETRON HCL 4 MG PO TABS: 4.0000 mg | ORAL_TABLET | Freq: Four times a day (QID) | ORAL | Status: DC | PRN

## 2024-08-12 MED ORDER — PANTOPRAZOLE SODIUM 40 MG PO TBEC
40.0000 mg | DELAYED_RELEASE_TABLET | Freq: Two times a day (BID) | ORAL | Status: DC
  Administered 2024-08-13 – 2024-08-17 (×9): 40 mg via ORAL
  Filled 2024-08-12 (×10): qty 1

## 2024-08-12 MED ORDER — MIDODRINE HCL 5 MG PO TABS: 10.0000 mg | ORAL_TABLET | ORAL | Status: DC

## 2024-08-12 MED ORDER — ACETAMINOPHEN 650 MG RE SUPP: 650.0000 mg | Freq: Four times a day (QID) | RECTAL | Status: DC | PRN

## 2024-08-12 MED ORDER — FAMOTIDINE 20 MG PO TABS
20.0000 mg | ORAL_TABLET | Freq: Every day | ORAL | Status: DC
  Administered 2024-08-12 – 2024-08-16 (×5): 20 mg via ORAL
  Filled 2024-08-12 (×5): qty 1

## 2024-08-12 MED ORDER — FERRIC CITRATE 1 GM 210 MG(FE) PO TABS
210.0000 mg | ORAL_TABLET | Freq: Three times a day (TID) | ORAL | Status: DC
  Administered 2024-08-13 – 2024-08-17 (×10): 210 mg via ORAL
  Filled 2024-08-12 (×17): qty 1

## 2024-08-12 MED ORDER — ONDANSETRON HCL 4 MG/2ML IJ SOLN: 4.0000 mg | Freq: Four times a day (QID) | INTRAMUSCULAR | Status: DC | PRN

## 2024-08-12 MED ORDER — CHLORHEXIDINE GLUCONATE CLOTH 2 % EX PADS
6.0000 | MEDICATED_PAD | Freq: Every day | CUTANEOUS | Status: DC
  Administered 2024-08-12: 6 via TOPICAL

## 2024-08-12 MED ORDER — ADULT MULTIVITAMIN W/MINERALS CH
1.0000 | ORAL_TABLET | Freq: Every morning | ORAL | Status: DC
  Administered 2024-08-13 – 2024-08-17 (×5): 1 via ORAL
  Filled 2024-08-12 (×5): qty 1

## 2024-08-12 MED ORDER — ACETAMINOPHEN 500 MG PO TABS
1000.0000 mg | ORAL_TABLET | Freq: Once | ORAL | Status: AC
  Administered 2024-08-12: 1000 mg via ORAL
  Filled 2024-08-12: qty 2

## 2024-08-12 MED ORDER — MIDODRINE HCL 5 MG PO TABS
10.0000 mg | ORAL_TABLET | Freq: Three times a day (TID) | ORAL | Status: DC
  Administered 2024-08-12: 10 mg via ORAL
  Filled 2024-08-12: qty 2

## 2024-08-12 MED ORDER — CYCLOBENZAPRINE HCL 10 MG PO TABS: 10.0000 mg | ORAL_TABLET | Freq: Every day | ORAL | Status: DC | PRN

## 2024-08-12 MED ORDER — TAMSULOSIN HCL 0.4 MG PO CAPS
0.4000 mg | ORAL_CAPSULE | Freq: Every evening | ORAL | Status: DC
  Administered 2024-08-13 – 2024-08-16 (×4): 0.4 mg via ORAL
  Filled 2024-08-12 (×4): qty 1

## 2024-08-12 MED ORDER — ACETAMINOPHEN 325 MG PO TABS
650.0000 mg | ORAL_TABLET | Freq: Four times a day (QID) | ORAL | Status: DC | PRN
  Administered 2024-08-13 – 2024-08-17 (×8): 650 mg via ORAL
  Filled 2024-08-12 (×6): qty 2

## 2024-08-12 MED ORDER — ATORVASTATIN CALCIUM 40 MG PO TABS
80.0000 mg | ORAL_TABLET | Freq: Every day | ORAL | Status: DC
  Administered 2024-08-12 – 2024-08-16 (×5): 80 mg via ORAL
  Filled 2024-08-12 (×5): qty 2

## 2024-08-12 MED ORDER — ENOXAPARIN SODIUM 40 MG/0.4ML IJ SOSY
40.0000 mg | PREFILLED_SYRINGE | INTRAMUSCULAR | Status: DC
  Administered 2024-08-13: 40 mg via SUBCUTANEOUS
  Filled 2024-08-12: qty 0.4

## 2024-08-12 MED ORDER — LINEZOLID 600 MG PO TABS
600.0000 mg | ORAL_TABLET | Freq: Once | ORAL | Status: AC
  Administered 2024-08-12: 600 mg via ORAL
  Filled 2024-08-12 (×2): qty 1

## 2024-08-12 MED ORDER — ALLOPURINOL 100 MG PO TABS
100.0000 mg | ORAL_TABLET | Freq: Every morning | ORAL | Status: DC
  Administered 2024-08-13 – 2024-08-17 (×5): 100 mg via ORAL
  Filled 2024-08-12 (×5): qty 1

## 2024-08-12 MED ORDER — AMIODARONE HCL 200 MG PO TABS: 200.0000 mg | ORAL_TABLET | Freq: Every day | ORAL | Status: DC

## 2024-08-12 MED ORDER — METOPROLOL SUCCINATE ER 25 MG PO TB24
25.0000 mg | ORAL_TABLET | Freq: Every day | ORAL | Status: DC
  Filled 2024-08-12 (×2): qty 1

## 2024-08-12 MED ORDER — AMIODARONE HCL 200 MG PO TABS
200.0000 mg | ORAL_TABLET | Freq: Every evening | ORAL | Status: DC
  Administered 2024-08-12 – 2024-08-16 (×5): 200 mg via ORAL
  Filled 2024-08-12 (×5): qty 1

## 2024-08-12 MED ORDER — SODIUM CHLORIDE 0.9 % IV BOLUS
1000.0000 mL | Freq: Once | INTRAVENOUS | Status: AC
  Administered 2024-08-12: 500 mL via INTRAVENOUS

## 2024-08-12 MED ORDER — ORAL CARE MOUTH RINSE: 15.0000 mL | OROMUCOSAL | Status: DC | PRN

## 2024-08-12 MED ORDER — SODIUM CHLORIDE 0.9 % IV BOLUS: 500.0000 mL | Freq: Once | INTRAVENOUS | Status: DC

## 2024-08-12 MED ORDER — ASPIRIN 81 MG PO TBEC
81.0000 mg | DELAYED_RELEASE_TABLET | Freq: Every morning | ORAL | Status: DC
  Administered 2024-08-13 – 2024-08-17 (×5): 81 mg via ORAL
  Filled 2024-08-12 (×5): qty 1

## 2024-08-12 NOTE — ED Provider Notes (Signed)
   Patient signed out to me by Lavanda Lesches, PA-C for hospital admission of osteomyelitis of the right great toe  Patient has history of atrial fibrillation renal transplant and end-stage renal disease on dialysis Monday Wednesday Friday.  Had complete treatment on Friday.  Workup today showed febrile on arrival, noticed pain and swelling of his right great toe which is new.  He is followed by podiatry had the nail removed of the left great toe which he states is healing well with pain in his right great toe began few days ago.  See previous provider for complete H&P  On my exam, patient does have swelling and discomfort of the right great toe with movement and palpation.  The nail of the right great toe is thickened and irregular.  There is some callus formation distally.  I do not appreciate any open wounds to the toe.  X-ray suggested osteomyelitis at the first phalanx.  Unasyn and linezolid  have been ordered.  Initial fever improved.  His blood pressures here have been low and his lactic acid is reassuring he endorses history of low blood pressure and takes midodrine , dose has been ordered here  Consulted [facility] hospitalist, Dr. Noralee who agrees to admit   Herlinda Milling, PA-C 08/12/24 2036    Francesca Elsie CROME, MD 08/12/24 [phone]
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
## 2024-08-12 NOTE — ED Provider Notes (Signed)
 [place] EMERGENCY DEPARTMENT AT [facility] Provider Note   CSN: [id number] Arrival date & time: 08/12/24  1706     Patient presents with: Shortness of Breath   [name] is a 58 y.o. male with a past medical history of end-stage renal disease on dialysis last full session this past Friday, history of renal transplant, history of atrial fibrillation, GERD.  Patient is on midodrine  which she takes prior to dialysis.  He reports that last night he felt himself go into atrial fibrillation for short period of time.  This was resolved but followed by shaking chills.  The symptoms continued today.  He took Tylenol  twice before deciding to come in.  Patient states that he has noticed a little bit of blood at the tip of his penis when he is making a bowel movement it will come out.  He denies making any urine regularly.  Secondly patient had a toenail removed by his podiatrist on the left foot but he states his right great toe is swollen and painful which is new.  He denies any cough but states he has been exposed to a few coworkers with respiratory symptoms.  He denies severe headache, abdominal pain, nausea or vomiting.    Shortness of Breath      Prior to Admission medications   Medication Sig Start Date End Date Taking? Authorizing Provider  acetaminophen  (TYLENOL ) 500 MG tablet Take 1,000 mg by mouth every 6 (six) hours as needed for moderate pain.    [provider]  allopurinol (ZYLOPRIM) 100 MG tablet Take 100 mg by mouth in the morning. 05/22/21   [provider]  amiodarone  (PACERONE ) 200 MG tablet Take 1 tablet by mouth daily. 02/15/24 08/13/24  [provider]  Ascorbic Acid (VITAMIN C PO) Take 500 mg by mouth in the morning.    [provider]  aspirin  EC 81 MG tablet Take 81 mg by mouth in the morning.    [provider]  atorvastatin  (LIPITOR ) 80 MG tablet Take 80 mg by mouth at bedtime.    [provider]   Cholecalciferol  (VITAMIN D ) 50 MCG (2000 UT) tablet Take 2,000 Units by mouth in the morning.    [provider]  cyclobenzaprine (FLEXERIL) 10 MG tablet Take 10 mg by mouth daily as needed for muscle spasms. 06/11/21   [provider]  famotidine  (PEPCID ) 20 MG tablet Take 20 mg by mouth at bedtime.    [provider]  ferric citrate  (AURYXIA ) 1 GM 210 MG(Fe) tablet Take 210 mg by mouth 3 (three) times daily with meals. Takes 1 with snacks.    [provider]  metoprolol  succinate (TOPROL -XL) 25 MG 24 hr tablet Take 25 mg by mouth daily.    [provider]  metoprolol  tartrate (LOPRESSOR ) 25 MG tablet Take 25 mg by mouth every other day.    [provider]  midodrine  (PROAMATINE ) 10 MG tablet Take 10 mg by mouth 3 (three) times daily. Patient takes 20 minutes before dialysis and prn during and after dialysis if blood pressure drops.    [provider]  midodrine  (PROAMATINE ) 5 MG tablet Take 3 tablets (15 mg total) by mouth 3 (three) times daily with meals. 08/30/22   Ricky Fines, MD  Multiple Vitamin (MULTIVITAMIN WITH MINERALS) TABS tablet Take 1 tablet by mouth in the morning.    [provider]  ondansetron  (ZOFRAN ) 4 MG tablet Take 1 tablet (4 mg total) by mouth every 8 (eight)  hours as needed for up to 10 doses for nausea or vomiting. Patient not taking: Reported on 07/03/2024 03/03/23   Andris Estefana BRAVO, PA-C  oxyCODONE  (ROXICODONE ) 5 MG immediate release tablet Take 1 tablet (5 mg total) by mouth every 8 (eight) hours as needed for up to 10 doses for severe pain. Patient not taking: Reported on 07/03/2024 03/03/23   Andris Estefana BRAVO, PA-C  pantoprazole  (PROTONIX ) 40 MG tablet Take 1 tablet (40 mg total) by mouth 2 (two) times daily. 04/07/21   Elgergawy, Brayton RAMAN, MD  tamsulosin  (FLOMAX ) 0.4 MG CAPS capsule Take 0.4 mg by mouth every evening.    [provider]    Allergies: Vancomycin    Review of Systems   Respiratory:  Positive for shortness of breath.     Updated Vital Signs BP 103/68   Pulse 74   Temp (!) 103.2 F (39.6 C) (Oral)   Resp (!) 22   Ht 6' 1 (1.854 m)   Wt 95.3 kg   SpO2 94%   BMI 27.71 kg/m   Physical Exam Vitals and nursing note reviewed.  Constitutional:      General: He is not in acute distress.    Appearance: He is well-developed. He is not diaphoretic.  HENT:     Head: Normocephalic and atraumatic.  Eyes:     General: No scleral icterus.    Conjunctiva/sclera: Conjunctivae normal.  Cardiovascular:     Rate and Rhythm: Normal rate and regular rhythm.     Heart sounds: Normal heart sounds.  Pulmonary:     Effort: Pulmonary effort is normal. No respiratory distress.     Breath sounds: Normal breath sounds.  Abdominal:     Palpations: Abdomen is soft.     Tenderness: There is no abdominal tenderness.  Musculoskeletal:     Cervical back: Normal range of motion and neck supple.  Feet:     Comments: Left great toe with recent toenail removal appears to be well-healing without any evidence of infection.  Right great toe shows thickened great toenail.  There is a large callus under the distal edge of the toenail with some pale discoloration.  The entire toe is erythematous and tender to palpation especially at the intra phalangeal joint. Skin:    General: Skin is warm and dry.  Neurological:     Mental Status: He is alert.  Psychiatric:        Behavior: Behavior normal.     (all labs ordered are listed, but only abnormal results are displayed) Labs Reviewed  CULTURE, BLOOD (ROUTINE X 2)  CULTURE, BLOOD (ROUTINE X 2)  RESP PANEL BY RT-PCR (RSV, FLU A&B, COVID)  [id number]  LACTIC ACID, PLASMA  LACTIC ACID, PLASMA  COMPREHENSIVE METABOLIC PANEL WITH GFR  CBC WITH DIFFERENTIAL/PLATELET  PROTIME-INR  URINALYSIS, W/ REFLEX TO CULTURE (INFECTION SUSPECTED)  I-STAT CHEM 8, ED    EKG: None  Radiology: No results found.   .Critical Care  Performed  by: Arloa Chroman, PA-C Authorized by: Arloa Chroman, PA-C   Critical care provider statement:    Critical care time (minutes):  60   Critical care time was exclusive of:  Separately billable procedures and treating other patients   Critical care was necessary to treat or prevent imminent or life-threatening deterioration of the following conditions:  Sepsis   Critical care was time spent personally by me on the following activities:  Development of treatment plan with patient or surrogate, evaluation of patient's response to treatment, interpretation of  cardiac output measurements, obtaining history from patient or surrogate, ordering and performing treatments and interventions, ordering and review of laboratory studies, ordering and review of radiographic studies, pulse oximetry and re-evaluation of patient's condition    Medications Ordered in the ED  acetaminophen  (TYLENOL ) tablet 1,000 mg (has no administration in time range)    Clinical Course as of 08/12/24 1937  Sun Aug 12, 2024  1803 Triage note states that patient was feeling short of breath.  He reports that he was feeling short of breath when he was nervous but now that he knows he has a fever he is relaxed and does not have any shortness of breath at this time [AH]  1908 WBC: 7.9 [AH]  1937 Patient appears to have infection of the right great toe consistent with osteomyelitis.  I ordered ampicillin and linezolid  for coverage of infection.  Will need admission.  Signout given to PA Triplett at shift change.  I have ordered fluids for blood pressure improvement and midodrine . [AH]    Clinical Course User Index [AH] Arloa Chroman, PA-C                                 Medical Decision Making Amount and/or Complexity of Data Reviewed Labs: ordered. Decision-making details documented in ED Course. Radiology: ordered.  Risk OTC drugs. Prescription drug management. Decision regarding hospitalization.   This patient  presents to the ED for concern of fluy like sxs, this involves an extensive number of treatment options, and is a complaint that carries with it a high risk of complications and morbidity.  DDX includes, UTI, Resp infection, cellulitis/ abscess/ osteomyelitis of the R toe.   Co morbidities:  ESRD, OSA, Hypotension onf midodrine , hx of GI bleed, Afib  Social Determinants of Health:   SDOH Screenings   Food Insecurity: No Food Insecurity (03/19/2024)   Received from [name]  Housing: Low Risk  (03/19/2024)   Received from [name]  Transportation Needs: No Transportation Needs (03/19/2024)   Received from [name]  Utilities: Not At Risk (03/19/2024)   Received from [name]  Depression (PHQ2-9): Low Risk  (08/25/2023)  Tobacco Use: Low Risk  (08/12/2024)     Additional history:  {Additional history obtained from emr   Lab Tests:  I Ordered, and personally interpreted labs.  The pertinent results include:   CBC without elevated white blood cell count baseline hemoglobin, CMP shows creatinine 11.33 BUN 60 consistent with end-stage renal disease normal potassium Lactic acid within normal limits Negative respiratory panel Imaging Studies:  I ordered imaging studies including portable 1 view chest x-ray and a right foot x-ray I independently visualized and interpreted imaging which showed no acute findings on chest x-ray.  Right foot x-ray shows cortical irregularity of the distal right phalanx consistent with potential underlying osteomyelitis I agree with the radiologist interpretation  Cardiac Monitoring/ECG:  The patient was maintained on a cardiac monitor.  I personally viewed and interpreted the cardiac monitored which showed an underlying rhythm of:   junctional rhythm at a rate of 74 Medicines ordered and prescription drug management:  I ordered medication including  Medications  allopurinol (ZYLOPRIM) tablet 100 mg (100 mg Oral Given 08/13/24 0812)  aspirin  EC  tablet 81 mg (81 mg Oral Given 08/13/24 0812)  atorvastatin  (LIPITOR ) tablet 80 mg (80 mg Oral Given 08/13/24 2045)  metoprolol  succinate (TOPROL -XL) 24 hr tablet 25 mg (25 mg Oral Not Given  08/13/24 0908)  famotidine  (PEPCID ) tablet 20 mg (20 mg Oral Given 08/13/24 2045)  ferric citrate  (AURYXIA ) tablet 210 mg (210 mg Oral Given 08/13/24 1809)  pantoprazole  (PROTONIX ) EC tablet 40 mg (40 mg Oral Given 08/13/24 2045)  tamsulosin  (FLOMAX ) capsule 0.4 mg (0.4 mg Oral Given 08/13/24 1809)  cyclobenzaprine (FLEXERIL) tablet 10 mg (has no administration in time range)  multivitamin with minerals tablet 1 tablet (1 tablet Oral Given 08/13/24 0812)  acetaminophen  (TYLENOL ) tablet 650 mg (650 mg Oral Given 08/13/24 1542)    Or  acetaminophen  (TYLENOL ) suppository 650 mg ( Rectal See Alternative 08/13/24 1542)  ondansetron  (ZOFRAN ) tablet 4 mg (has no administration in time range)    Or  ondansetron  (ZOFRAN ) injection 4 mg (has no administration in time range)  Chlorhexidine  Gluconate Cloth 2 % PADS 6 each (6 each Topical Given 08/12/24 2306)  Oral care mouth rinse (has no administration in time range)  amiodarone  (PACERONE ) tablet 200 mg (200 mg Oral Given 08/13/24 1808)  ascorbic acid (VITAMIN C) tablet 500 mg (500 mg Oral Given 08/13/24 9187)  Chlorhexidine  Gluconate Cloth 2 % PADS 6 each (6 each Topical Given 08/13/24 0908)  pentafluoroprop-tetrafluoroeth (GEBAUERS) aerosol 1 Application (1 Application Topical Given 08/13/24 1617)  lidocaine  (PF) (XYLOCAINE ) 1 % injection 5 mL (has no administration in time range)  lidocaine -prilocaine  (EMLA ) cream 1 Application (has no administration in time range)  heparin  injection 1,000 Units (has no administration in time range)  anticoagulant sodium citrate  solution 5 mL (has no administration in time range)  alteplase  (CATHFLO ACTIVASE ) injection 2 mg (has no administration in time range)  loperamide  (IMODIUM ) capsule 2 mg (2 mg Oral Given 08/13/24 2045)  albumin  human  25 % solution 25 g (0 g Intravenous Stopped 08/13/24 1633)  enoxaparin (LOVENOX) injection 30 mg (has no administration in time range)  linezolid  (ZYVOX ) IVPB 600 mg (has no administration in time range)  midodrine  (PROAMATINE ) tablet 15 mg (15 mg Oral Given 08/13/24 1843)  alum & mag hydroxide-simeth (MAALOX/MYLANTA) 200-200-20 MG/5ML suspension 30 mL (30 mLs Oral Given 08/13/24 2045)  acetaminophen  (TYLENOL ) tablet 1,000 mg (1,000 mg Oral Given 08/12/24 1834)  sodium chloride  0.9 % bolus 1,000 mL (0 mLs Intravenous Stopped 08/12/24 2239)  Ampicillin-Sulbactam (UNASYN) 3 g in sodium chloride  0.9 % 100 mL IVPB (0 g Intravenous Stopped 08/12/24 2024)    And  linezolid  (ZYVOX ) tablet 600 mg (600 mg Oral Given 08/12/24 2022)  calcium  carbonate (TUMS - dosed in mg elemental calcium ) chewable tablet 200 mg of elemental calcium  (200 mg of elemental calcium  Oral Given 08/13/24 0510)  acetaminophen  (TYLENOL ) tablet 650 mg (650 mg Oral Given 08/13/24 0748)     Test Considered:  MRI however this can be done outpatient  Critical Interventions:   fluids, midodrine  for blood pressure support, antibiotics including Augmentin and linezolid  for coverage of presumed osteomyelitis, antipyretics   patient will need admission, signout given to PA Triplett at shift handoff      Final diagnoses:  None    ED Discharge Orders     None          Arloa Chroman, PA-C 08/13/24 2135    Francesca Elsie CROME, MD 08/23/24 1546

## 2024-08-12 NOTE — Assessment & Plan Note (Signed)
No signs of urinary retention, continue with tamsulosin

## 2024-08-12 NOTE — Assessment & Plan Note (Signed)
 Continue with proton pump inhibitor with pantoprazole .

## 2024-08-12 NOTE — Assessment & Plan Note (Signed)
 No signs of acute exacerbation Continue blood pressure control with metoprolol 

## 2024-08-12 NOTE — ED Notes (Signed)
 ED Provider at bedside.

## 2024-08-12 NOTE — Assessment & Plan Note (Signed)
Continue Cpap

## 2024-08-12 NOTE — ED Notes (Addendum)
 BP low. Pt reports usually runs low. Will continue to monitor. Takes midodrine  on HD days. HD MWF. Last on Friday. C/o chills only. Denies pain sob or other sx. Alert, NAD, calm, interactive, resps e/u, speaking in clear complete sentences. States, feel better. Family at [facility] stepping away.

## 2024-08-12 NOTE — Assessment & Plan Note (Signed)
 Continue rate control with amiodarone  and metoprolol  Noted he is not on anticoagulation, will need to check old records.

## 2024-08-12 NOTE — Assessment & Plan Note (Signed)
 Patient on Monday, Wednesday and Friday scheduled Positive hypotension, on HD days, he takes midodrine  for blood pressure support on HD days.  Continue blood pressure monitoring Consult nephrology for routine HD tomorrow.

## 2024-08-12 NOTE — Assessment & Plan Note (Signed)
 Continue with statin therapy.  ?

## 2024-08-12 NOTE — Assessment & Plan Note (Signed)
 No chest pain and no signs of acute coronary syndrome Continue with aspirin  and statin

## 2024-08-12 NOTE — ED Triage Notes (Signed)
 Pt states he had some Afib last night but began to have chills ever since. Pt noted some blood at tip of his penis.  Pt is a dialysis pt.  Pt states his lost a toe nail 2 weeks ago.

## 2024-08-12 NOTE — Assessment & Plan Note (Signed)
 Unclear source of fever.   Plan to continue to follow up cell count, temperature curve and cultures.  Further work up with right foot MRI Hold on antibiotic therapy for now.  Check uric acid

## 2024-08-13 ENCOUNTER — Observation Stay (HOSPITAL_COMMUNITY)

## 2024-08-13 DIAGNOSIS — I5032 Chronic diastolic (congestive) heart failure: Secondary | ICD-10-CM | POA: Diagnosis present

## 2024-08-13 DIAGNOSIS — Z881 Allergy status to other antibiotic agents status: Secondary | ICD-10-CM | POA: Diagnosis not present

## 2024-08-13 DIAGNOSIS — I9589 Other hypotension: Secondary | ICD-10-CM | POA: Diagnosis present

## 2024-08-13 DIAGNOSIS — I272 Pulmonary hypertension, unspecified: Secondary | ICD-10-CM | POA: Diagnosis present

## 2024-08-13 DIAGNOSIS — M8600 Acute hematogenous osteomyelitis, unspecified site: Secondary | ICD-10-CM | POA: Diagnosis not present

## 2024-08-13 DIAGNOSIS — Z955 Presence of coronary angioplasty implant and graft: Secondary | ICD-10-CM | POA: Diagnosis not present

## 2024-08-13 DIAGNOSIS — Z1152 Encounter for screening for COVID-19: Secondary | ICD-10-CM | POA: Diagnosis not present

## 2024-08-13 DIAGNOSIS — Y830 Surgical operation with transplant of whole organ as the cause of abnormal reaction of the patient, or of later complication, without mention of misadventure at the time of the procedure: Secondary | ICD-10-CM | POA: Diagnosis present

## 2024-08-13 DIAGNOSIS — D631 Anemia in chronic kidney disease: Secondary | ICD-10-CM | POA: Diagnosis present

## 2024-08-13 DIAGNOSIS — E782 Mixed hyperlipidemia: Secondary | ICD-10-CM | POA: Diagnosis present

## 2024-08-13 DIAGNOSIS — B9629 Other Escherichia coli [E. coli] as the cause of diseases classified elsewhere: Secondary | ICD-10-CM | POA: Diagnosis not present

## 2024-08-13 DIAGNOSIS — N2581 Secondary hyperparathyroidism of renal origin: Secondary | ICD-10-CM | POA: Diagnosis present

## 2024-08-13 DIAGNOSIS — M86171 Other acute osteomyelitis, right ankle and foot: Secondary | ICD-10-CM | POA: Diagnosis present

## 2024-08-13 DIAGNOSIS — G4733 Obstructive sleep apnea (adult) (pediatric): Secondary | ICD-10-CM | POA: Diagnosis present

## 2024-08-13 DIAGNOSIS — R197 Diarrhea, unspecified: Secondary | ICD-10-CM | POA: Diagnosis not present

## 2024-08-13 DIAGNOSIS — R7881 Bacteremia: Secondary | ICD-10-CM | POA: Diagnosis present

## 2024-08-13 DIAGNOSIS — I132 Hypertensive heart and chronic kidney disease with heart failure and with stage 5 chronic kidney disease, or end stage renal disease: Secondary | ICD-10-CM | POA: Diagnosis present

## 2024-08-13 DIAGNOSIS — B9620 Unspecified Escherichia coli [E. coli] as the cause of diseases classified elsewhere: Secondary | ICD-10-CM | POA: Diagnosis present

## 2024-08-13 DIAGNOSIS — I953 Hypotension of hemodialysis: Secondary | ICD-10-CM | POA: Diagnosis not present

## 2024-08-13 DIAGNOSIS — R509 Fever, unspecified: Secondary | ICD-10-CM | POA: Diagnosis present

## 2024-08-13 DIAGNOSIS — N400 Enlarged prostate without lower urinary tract symptoms: Secondary | ICD-10-CM | POA: Diagnosis present

## 2024-08-13 DIAGNOSIS — Z992 Dependence on renal dialysis: Secondary | ICD-10-CM | POA: Diagnosis not present

## 2024-08-13 DIAGNOSIS — A4151 Sepsis due to Escherichia coli [E. coli]: Secondary | ICD-10-CM | POA: Diagnosis not present

## 2024-08-13 DIAGNOSIS — I2510 Atherosclerotic heart disease of native coronary artery without angina pectoris: Secondary | ICD-10-CM | POA: Diagnosis present

## 2024-08-13 DIAGNOSIS — T8612 Kidney transplant failure: Secondary | ICD-10-CM | POA: Diagnosis present

## 2024-08-13 DIAGNOSIS — Z8249 Family history of ischemic heart disease and other diseases of the circulatory system: Secondary | ICD-10-CM | POA: Diagnosis not present

## 2024-08-13 DIAGNOSIS — N186 End stage renal disease: Secondary | ICD-10-CM | POA: Diagnosis present

## 2024-08-13 DIAGNOSIS — K219 Gastro-esophageal reflux disease without esophagitis: Secondary | ICD-10-CM | POA: Diagnosis present

## 2024-08-13 DIAGNOSIS — I480 Paroxysmal atrial fibrillation: Secondary | ICD-10-CM | POA: Diagnosis present

## 2024-08-13 LAB — BASIC METABOLIC PANEL WITH GFR
Anion gap: 19 — ABNORMAL HIGH (ref 5–15)
BUN: 64 mg/dL — ABNORMAL HIGH (ref 6–20)
CO2: 23 mmol/L (ref 22–32)
Calcium: 7.3 mg/dL — ABNORMAL LOW (ref 8.9–10.3)
Chloride: 96 mmol/L — ABNORMAL LOW (ref 98–111)
Creatinine, Ser: 11.99 mg/dL — ABNORMAL HIGH (ref 0.61–1.24)
GFR, Estimated: 4 mL/min — ABNORMAL LOW (ref >60)
Glucose, Bld: 82 mg/dL (ref 70–99)
Potassium: 4.2 mmol/L (ref 3.5–5.1)
Sodium: 138 mmol/L (ref 135–145)

## 2024-08-13 LAB — CBC
HCT: 36.8 % — ABNORMAL LOW (ref 39.0–52.0)
HCT: 32.8 % — ABNORMAL LOW (ref 39.0–52.0)
Hemoglobin: 11.4 g/dL — ABNORMAL LOW (ref 13.0–17.0)
Hemoglobin: 10.3 g/dL — ABNORMAL LOW (ref 13.0–17.0)
MCH: 30.2 pg (ref 26.0–34.0)
MCH: 29.9 pg (ref 26.0–34.0)
MCHC: 31 g/dL (ref 30.0–36.0)
MCHC: 31.4 g/dL (ref 30.0–36.0)
MCV: 97.4 fL (ref 80.0–100.0)
MCV: 95.3 fL (ref 80.0–100.0)
Platelets: 148 K/uL — ABNORMAL LOW (ref 150–400)
Platelets: 149 K/uL — ABNORMAL LOW (ref 150–400)
RBC: 3.78 MIL/uL — ABNORMAL LOW (ref 4.22–5.81)
RBC: 3.44 MIL/uL — ABNORMAL LOW (ref 4.22–5.81)
RDW: 16.1 % — ABNORMAL HIGH (ref 11.5–15.5)
RDW: 16 % — ABNORMAL HIGH (ref 11.5–15.5)
WBC: 7.8 K/uL (ref 4.0–10.5)
WBC: 8.2 K/uL (ref 4.0–10.5)
nRBC: 0 % (ref 0.0–0.2)
nRBC: 0 % (ref 0.0–0.2)

## 2024-08-13 LAB — HIV ANTIBODY (ROUTINE TESTING W REFLEX): HIV Screen 4th Generation wRfx: NONREACTIVE

## 2024-08-13 LAB — URIC ACID: Uric Acid, Serum: 5.7 mg/dL (ref 3.7–8.6)

## 2024-08-13 LAB — MRSA NEXT GEN BY PCR, NASAL: MRSA by PCR Next Gen: NOT DETECTED

## 2024-08-13 LAB — HEPATITIS B SURFACE ANTIGEN: Hepatitis B Surface Ag: NONREACTIVE

## 2024-08-13 MED ORDER — CALCIUM CARBONATE ANTACID 500 MG PO CHEW
1.0000 | CHEWABLE_TABLET | Freq: Once | ORAL | Status: AC
  Administered 2024-08-13: 200 mg via ORAL
  Filled 2024-08-13: qty 1

## 2024-08-13 MED ORDER — MIDODRINE HCL 5 MG PO TABS
15.0000 mg | ORAL_TABLET | ORAL | Status: DC
Start: 2024-08-13 — End: 2024-08-13
  Administered 2024-08-13: 15 mg via ORAL
  Filled 2024-08-13: qty 3

## 2024-08-13 MED ORDER — LINEZOLID 600 MG/300ML IV SOLN
600.0000 mg | Freq: Two times a day (BID) | INTRAVENOUS | Status: DC
  Administered 2024-08-13 – 2024-08-16 (×6): 600 mg via INTRAVENOUS
  Filled 2024-08-13 (×8): qty 300

## 2024-08-13 MED ORDER — LIDOCAINE-PRILOCAINE 2.5-2.5 % EX CREA: 1.0000 | TOPICAL_CREAM | CUTANEOUS | Status: DC | PRN

## 2024-08-13 MED ORDER — LIDOCAINE HCL (PF) 1 % IJ SOLN: 5.0000 mL | INTRAMUSCULAR | Status: DC | PRN

## 2024-08-13 MED ORDER — ALBUMIN HUMAN 25 % IV SOLN
INTRAVENOUS | Status: AC
  Filled 2024-08-13: qty 100

## 2024-08-13 MED ORDER — PENTAFLUOROPROP-TETRAFLUOROETH EX AERO
1.0000 | INHALATION_SPRAY | CUTANEOUS | Status: DC | PRN
  Administered 2024-08-13: 1 via TOPICAL
  Filled 2024-08-13: qty 30

## 2024-08-13 MED ORDER — ENOXAPARIN SODIUM 30 MG/0.3ML IJ SOSY
30.0000 mg | PREFILLED_SYRINGE | INTRAMUSCULAR | Status: DC
  Administered 2024-08-14 – 2024-08-15 (×2): 30 mg via SUBCUTANEOUS
  Filled 2024-08-13 (×3): qty 0.3

## 2024-08-13 MED ORDER — ACETAMINOPHEN 325 MG PO TABS
650.0000 mg | ORAL_TABLET | Freq: Once | ORAL | Status: AC
  Administered 2024-08-13: 650 mg via ORAL
  Filled 2024-08-13: qty 2

## 2024-08-13 MED ORDER — HEPARIN SODIUM (PORCINE) 1000 UNIT/ML DIALYSIS: 1000.0000 [IU] | INTRAMUSCULAR | Status: DC | PRN

## 2024-08-13 MED ORDER — ALBUMIN HUMAN 25 % IV SOLN
25.0000 g | Freq: Once | INTRAVENOUS | Status: AC
  Administered 2024-08-15: 25 g via INTRAVENOUS

## 2024-08-13 MED ORDER — LOPERAMIDE HCL 2 MG PO CAPS
2.0000 mg | ORAL_CAPSULE | ORAL | Status: DC | PRN
  Administered 2024-08-13 – 2024-08-16 (×5): 2 mg via ORAL
  Filled 2024-08-13 (×5): qty 1

## 2024-08-13 MED ORDER — ANTICOAGULANT SODIUM CITRATE 4% (200MG/5ML) IV SOLN: 5.0000 mL | Status: DC | PRN

## 2024-08-13 MED ORDER — CHLORHEXIDINE GLUCONATE CLOTH 2 % EX PADS
6.0000 | MEDICATED_PAD | Freq: Every day | CUTANEOUS | Status: DC
  Administered 2024-08-13 – 2024-08-16 (×4): 6 via TOPICAL

## 2024-08-13 MED ORDER — ALTEPLASE 2 MG IJ SOLR: 2.0000 mg | Freq: Once | INTRAMUSCULAR | Status: DC | PRN

## 2024-08-13 MED ORDER — MIDODRINE HCL 5 MG PO TABS
15.0000 mg | ORAL_TABLET | Freq: Three times a day (TID) | ORAL | Status: DC
  Administered 2024-08-13 – 2024-08-17 (×12): 15 mg via ORAL
  Filled 2024-08-13 (×11): qty 3

## 2024-08-13 MED ORDER — PENTAFLUOROPROP-TETRAFLUOROETH EX AERO
INHALATION_SPRAY | CUTANEOUS | Status: AC
  Filled 2024-08-13: qty 30

## 2024-08-13 MED ORDER — VITAMIN C 500 MG PO TABS
500.0000 mg | ORAL_TABLET | Freq: Every morning | ORAL | Status: DC
Start: 2024-08-13 — End: 2024-08-17
  Administered 2024-08-13 – 2024-08-17 (×5): 500 mg via ORAL
  Filled 2024-08-13 (×5): qty 1

## 2024-08-13 MED ORDER — ALUM & MAG HYDROXIDE-SIMETH 200-200-20 MG/5ML PO SUSP
30.0000 mL | ORAL | Status: DC | PRN
  Administered 2024-08-13 – 2024-08-16 (×8): 30 mL via ORAL
  Filled 2024-08-13 (×9): qty 30

## 2024-08-13 NOTE — Progress Notes (Signed)
 Progress Note   Patient: [name] FMW:[id number] DOB: 01/30/1966 DOA: 08/12/2024     0 DOS: the patient was seen and examined on 08/13/2024   Brief hospital admission narrative: As per H&P written by Dr. Noralee on 08/12/2024 [name] is a 58 y.o. 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    Patient has been at his usual stat of health until yesterday when he noted palpitations, consistent with his atrial fibrillation. Symptoms self resolved but then noted fever and chills, prompting him to come to the hospital.  He mentions every time is atrial fibrillation becomes symptomatic and he develops fever he usually has an infection.  His last HD was on Friday, 48 hrs ago, with no complications. He has a left upper extremity fistula.  Currently he is in the process of getting into the renal transplant list in [name].  Recently he had a toe nail removed  2 weeks ago, on his left foot, by podiatry.    Denies any maise or generalized weakness, no nausea or vomiting, no rashes, chest pain or dyspnea.   Assessment and plan Fever - With concern for right great toe osteomyelitis - Continue supportive care and as needed antipyretics - Patient has been started on Zyvox  - MRI suggesting the presence of possible osteomyelitis but unable to confirm it. - Continue as needed analgesics and follow clinical response. - Uric acid 5.7 (essentially ruling out component of gout).  Paroxysmal atrial fibrillation (HCC) -Will continue rate control with amiodarone  and metoprolol   - Not on chronic anticoagulation.   - Continue telemetry monitoring.  Chronic diastolic CHF (congestive heart failure) (HCC) -Stable and compensated - Continue volume management with hemodialysis - Follow daily  weights/strict I's and O's - Low-sodium diet discussed with patient.  S/P coronary artery stent placement -No chest pain or shortness of breath - Continue aspirin , metoprolol  and statin - Continue patient follow-up with cardiology service.  ESRD on dialysis [facility]) -Nephrology service have been consulted - Will follow recommendations regarding continuation of dialysis treatments  Mixed hyperlipidemia -Continue statin.  BPH (benign prostatic hyperplasia) -Continue treatment with Flomax .  OSA on CPAP -Continue CPAP nightly.  GERD (gastroesophageal reflux disease) -Continue PPI.  Subjective:  Still spiking fever; no chest pain, no nausea, no vomiting.  Reporting right great toe swelling and pain.  Physical Exam: Vitals:   08/13/24 1600 08/13/24 1630 08/13/24 1645 08/13/24 1700  BP: 113/78 (!) 79/37 (!) 83/43 (!) 79/50  Pulse: (!) 115 (!) 57 (!) 58 (!) 58  Resp: (!) 28 18 (!) 31 18  Temp: 98 F (36.7 C)     TempSrc: Oral     SpO2: 98% 99% 96% 97%  Weight: 99.4 kg     Height:       General exam: Alert, awake, oriented x 3; in no acute distress. Respiratory system: Clear to auscultation. Respiratory effort normal.  Good saturation on room air. Cardiovascular system: Rate controlled, no rubs, no gallops, no JVD. Gastrointestinal system: Abdomen is nondistended, soft and nontender. No organomegaly or masses felt. Normal bowel sounds heard. Central nervous system:  No focal neurological deficits. Extremities: No cyanosis or clubbing; right great toe swollen and tender to palpation at the base of his toe.  No open wounds or drainage. Skin: No rashes, no petechiae. Psychiatry: Judgement and insight appear normal. Mood & affect appropriate.  Data Reviewed: CBC: WBC 7.8, hemoglobin 11.4 and platelet count 148K Basic metabolic panel: Sodium 138, potassium 4.2, chloride 96, bicarb 23, BUN 64, creatinine 11.99 and GFR 4 Uric acid: 5.7   Family Communication: Wife updated over  the phone at bedside while examining patient.  Disposition: Status is: Inpatient Remains inpatient appropriate because: Continue IV antibiotics.  Anticipating discharge back home once medically stable.  Time spent: 50 minutes  Author: Eric Nunnery, MD 08/13/2024 5:14 PM  For on call review [email or web address].
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
## 2024-08-13 NOTE — Progress Notes (Signed)
   08/13/24 2212  BiPAP/CPAP/SIPAP  BiPAP/CPAP/SIPAP Pt Type Adult  Mask Type Full face mask  Dentures removed? Not applicable  Patient Home Machine Yes  Safety Check Completed by RT for Home Unit Yes, no issues noted  Patient Home Mask Yes  Patient Home Tubing Yes  Device Plugged into RED Power Outlet Yes   Patient using home BIPAP independently.

## 2024-08-13 NOTE — Plan of Care (Signed)

## 2024-08-13 NOTE — Progress Notes (Signed)
 Patient BP has been low with systolic in 70s, current BP 71/56 with MAP 60, patient is sleeping, awake when aroused, alert and oriented X4, does not complain dizziness or lethargic, says he is chronically low with his always in 70s, MD aware about this, MD informed, does not want to do any interventions, continue to monitor.

## 2024-08-13 NOTE — Procedures (Signed)
 Received patient in bed.  Alert and oriented.  Informed consent signed and in chart.  LLA AVF cannulated with 15g needles x 2 per policy, without difficulty. Secured well with tape. Tx initiated per MD order. UF goal 3000 ml. Albumin  25% started at tx start, per [facility]. TX duration:3.25 Pt tolerated tx well. Tx complete. Blood returned, needles removed, sites held x 2 until hemostasis achieved. Gauze changed prior to taping. Patient tolerated well.  Alert, without acute distress.  Hand-off given to patient's nurse.   Access used: LLA  AVF Access issues: none  Total UF removed: 3000 Medication(s) given: See MAR   [facility]

## 2024-08-13 NOTE — TOC CM/SW Note (Signed)
 Transition of Care [facility]) - Inpatient Brief Assessment   Patient Details  Name: [name] MRN: [id number] Date of Birth: [birth year]  Transition of Care [facility]) CM/SW Contact:    Noreen KATHEE Cleotilde ISRAEL Phone Number: 08/13/2024, 9:46 AM   Clinical Narrative:   Inpatient Care Manager (ICM) has reviewed patient and no ICM needs have been identified at this time. We will continue to monitor patient advancement through interdisciplinary progression rounds. If new patient transition needs arise, please place a ICM consult.   Transition of Care Asessment: Insurance and Status: Insurance coverage has been reviewed Patient has primary care physician: Yes Home environment has been reviewed: Single Family Home Prior level of function:: Independent Prior/Current Home Services: No current home services Social Drivers of Health Review: SDOH reviewed no interventions necessary Readmission risk has been reviewed: Yes Transition of care needs: no transition of care needs at this time

## 2024-08-13 NOTE — Progress Notes (Signed)
 Pt receives out-pt HD at [facility], MWF, 0615am chair time. Contacted clinic. Will continue to assist as needed.   [facility] [place] Markey Dialysis Navigator [phone] [facility] #[phone]

## 2024-08-14 DIAGNOSIS — G4733 Obstructive sleep apnea (adult) (pediatric): Secondary | ICD-10-CM | POA: Diagnosis not present

## 2024-08-14 DIAGNOSIS — N400 Enlarged prostate without lower urinary tract symptoms: Secondary | ICD-10-CM | POA: Diagnosis not present

## 2024-08-14 DIAGNOSIS — R509 Fever, unspecified: Secondary | ICD-10-CM | POA: Diagnosis not present

## 2024-08-14 DIAGNOSIS — E782 Mixed hyperlipidemia: Secondary | ICD-10-CM | POA: Diagnosis not present

## 2024-08-14 LAB — BLOOD CULTURE ID PANEL (REFLEXED) - BCID2

## 2024-08-14 LAB — LACTIC ACID, PLASMA: Lactic Acid, Venous: 1.5 mmol/L (ref 0.5–1.9)

## 2024-08-14 LAB — HEPATITIS B SURFACE ANTIBODY, QUANTITATIVE: Hep B S AB Quant (Post): 58.3 m[IU]/mL

## 2024-08-14 LAB — TSH: TSH: 1.385 u[IU]/mL (ref 0.350–4.500)

## 2024-08-14 LAB — ACTH STIMULATION, 3 TIME POINTS
Cortisol, 30 Min: 18.4 ug/dL
Cortisol, 60 Min: 20.4 ug/dL
Cortisol, Base: 13.9 ug/dL

## 2024-08-14 MED ORDER — MIDODRINE HCL 5 MG PO TABS
5.0000 mg | ORAL_TABLET | Freq: Once | ORAL | Status: AC
  Administered 2024-08-14: 5 mg via ORAL
  Filled 2024-08-14: qty 1

## 2024-08-14 MED ORDER — COSYNTROPIN 0.25 MG IJ SOLR
0.2500 mg | Freq: Once | INTRAMUSCULAR | Status: AC
Start: 2024-08-14 — End: 2024-08-14
  Administered 2024-08-14: 0.25 mg via INTRAVENOUS

## 2024-08-14 MED ORDER — SODIUM CHLORIDE 0.9 % IV SOLN
2.0000 g | INTRAVENOUS | Status: DC
  Administered 2024-08-14: 2 g via INTRAVENOUS
  Filled 2024-08-14: qty 20

## 2024-08-14 MED ORDER — SODIUM CHLORIDE 0.9 % IV SOLN
1.0000 g | INTRAVENOUS | Status: DC
  Filled 2024-08-14: qty 20

## 2024-08-14 MED ORDER — ALBUMIN HUMAN 5 % IV SOLN
12.5000 g | Freq: Once | INTRAVENOUS | Status: AC
Start: 2024-08-14 — End: 2024-08-14
  Administered 2024-08-14: 12.5 g via INTRAVENOUS
  Filled 2024-08-14 (×2): qty 250

## 2024-08-14 MED ORDER — COSYNTROPIN 0.25 MG IJ SOLR: 0.2500 mg | Freq: Once | INTRAMUSCULAR | Status: DC

## 2024-08-14 MED ORDER — CALCITRIOL 0.25 MCG PO CAPS
1.0000 ug | ORAL_CAPSULE | ORAL | Status: DC
  Administered 2024-08-15 – 2024-08-17 (×2): 1 ug via ORAL

## 2024-08-14 NOTE — Progress Notes (Signed)
 Set up patient's Home BIPAP unit at bedside for patient and placed water  in machine for patient.  Unit is plugged into a red outlet and cord does not show any frayed areas.

## 2024-08-14 NOTE — Progress Notes (Signed)
 BP for patient trending low overnight.  Patient asymptomatic and ambulating to bedside commode without difficulty.  However, MAP ranging 35-57 overnight.  New orders given for albumin  and extra dose of midodrine , with only mild improvement in BP.  Patient arouses easily from sleep and denies any symptoms.   Albumin  infusing at this time and continuing to closely monitor.

## 2024-08-14 NOTE — Plan of Care (Signed)

## 2024-08-14 NOTE — Progress Notes (Signed)
   08/14/24 2239  BiPAP/CPAP/SIPAP  BiPAP/CPAP/SIPAP Pt Type Adult  BiPAP/CPAP/SIPAP Resmed  Mask Type Full face mask  Dentures removed? Not applicable  Mask Size Medium  FiO2 (%) 21 %  Patient Home Machine Yes  Safety Check Completed by RT for Home Unit Yes, no issues noted  Patient Home Mask Yes  Patient Home Tubing Yes  Auto Titrate Yes  Minimum cmH2O 11 cmH2O  Maximum cmH2O 15 cmH2O  Device Plugged into RED Power Outlet Yes

## 2024-08-14 NOTE — Progress Notes (Addendum)
 Progress Note   Patient: [name] FMW:[id number] DOB: 13-Jul-1966 DOA: 08/12/2024     1 DOS: the patient was seen and examined on 08/14/2024   Brief hospital admission narrative: As per H&P written by Dr. Noralee on 08/12/2024 [name] is a 58 y.o. 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    Patient has been at his usual stat of health until yesterday when he noted palpitations, consistent with his atrial fibrillation. Symptoms self resolved but then noted fever and chills, prompting him to come to the hospital.  He mentions every time is atrial fibrillation becomes symptomatic and he develops fever he usually has an infection.  His last HD was on Friday, 48 hrs ago, with no complications. He has a left upper extremity fistula.  Currently he is in the process of getting into the renal transplant list in [name].  Recently he had a toe nail removed  2 weeks ago, on his left foot, by podiatry.    Denies any maise or generalized weakness, no nausea or vomiting, no rashes, chest pain or dyspnea.   Assessment and plan Fever/presumed osteomyelitis and bacteremia - With concern for right great toe osteomyelitis - Continue supportive care and as needed antipyretics - Patient has been started on Zyvox  - MRI suggesting the presence of possible osteomyelitis but unable to confirm it. - Continue as needed analgesics and follow clinical response. - Uric acid 5.7 (essentially ruling out component of gout). - There were also positive blood culture for gram-negative rods; with BCID for ESBL E. Coli, meropenem has been started, dose per pharmacy.  Paroxysmal atrial fibrillation (HCC) -Will continue rate control with amiodarone  and metoprolol   - Not on chronic anticoagulation.   -  Continue telemetry monitoring.  Chronic diastolic CHF (congestive heart failure) (HCC) -Stable and compensated - Continue volume management with hemodialysis - Follow daily weights/strict I's and O's - Low-sodium diet discussed with patient.  S/P coronary artery stent placement -No chest pain or shortness of breath - Continue aspirin , metoprolol  and statin - Continue patient follow-up with cardiology service.  ESRD on dialysis [facility]) -Nephrology service have been consulted - Will follow recommendations regarding continuation of dialysis treatments  Mixed hyperlipidemia -Continue statin.  BPH (benign prostatic hyperplasia) -Continue treatment with Flomax .  OSA on CPAP -Continue CPAP nightly.  GERD (gastroesophageal reflux disease) -Continue PPI.  Subjective:  Currently afebrile; no chest pain, no nausea, no vomiting.  Blood pressure remains soft but overall stable.  Continue IV antibiotics.  Physical Exam: Vitals:   08/14/24 0800 08/14/24 1000 08/14/24 1113 08/14/24 1200  BP:  (!) 93/59    Pulse: (!) 57     Resp: 18 19    Temp: 98.2 F (36.8 C)  98.6 F (37 C)   TempSrc: Oral  Oral   SpO2: 100% 99%  98%  Weight:      Height:       General exam: Alert, awake, oriented x 3; in no acute distress and feeling better.  Currently afebrile. Respiratory system: Good air movement bilaterally; no using accessory muscles. Cardiovascular system:RRR. No rubs or gallops; no JVD. Gastrointestinal system: Abdomen is nondistended, soft and nontender.  Positive bowel sounds. Central nervous system: No focal neurological deficits. Extremities: No cyanosis or clubbing.  Right great toe swollen at the base and per patient with discomfort or/decreased range of motion.  Skin: No petechiae. Psychiatry: Judgement and insight appear normal. Mood & affect appropriate.   Data Reviewed: CBC: WBC 7.8, hemoglobin 11.4 and platelet count 148K Basic metabolic panel: Sodium 138, potassium 4.2,  chloride 96, bicarb 23, BUN 64, creatinine 11.99 and GFR 4 Uric acid: 5.7   Family Communication: Wife updated over the phone at bedside while examining patient.  Disposition: Status is: Inpatient Remains inpatient appropriate because: Continue IV antibiotics.  Anticipating discharge back home once medically stable.  Time spent: 50 minutes  Author: Eric Nunnery, MD 08/14/2024 12:30 PM  For on call review [email or web address].

## 2024-08-14 NOTE — Progress Notes (Signed)
 PHARMACY - PHYSICIAN COMMUNICATION CRITICAL VALUE ALERT - BLOOD CULTURE IDENTIFICATION (BCID)  [name] is an 58 y.o. male who presented to [facility] on 08/12/2024   Assessment:  ESBL e. coli  Name of physician (or Provider) Contacted: Dr. Ricky  Current antibiotics: Ceftriaxone  and Zyvox   Changes to prescribed antibiotics recommended:  Recommendations accepted by provider- change ceftriaxone  to meropenem   Results for orders placed or performed during the hospital encounter of 08/12/24  Blood Culture ID Panel (Reflexed) (Collected: 08/12/2024  6:14 PM)  Result Value Ref Range   Enterococcus faecalis NOT DETECTED NOT DETECTED   Enterococcus Faecium NOT DETECTED NOT DETECTED   Listeria monocytogenes NOT DETECTED NOT DETECTED   Staphylococcus species NOT DETECTED NOT DETECTED   Staphylococcus aureus (BCID) NOT DETECTED NOT DETECTED   Staphylococcus epidermidis NOT DETECTED NOT DETECTED   Staphylococcus lugdunensis NOT DETECTED NOT DETECTED   Streptococcus species NOT DETECTED NOT DETECTED   Streptococcus agalactiae NOT DETECTED NOT DETECTED   Streptococcus pneumoniae NOT DETECTED NOT DETECTED   Streptococcus pyogenes NOT DETECTED NOT DETECTED   A.calcoaceticus-baumannii NOT DETECTED NOT DETECTED   Bacteroides fragilis NOT DETECTED NOT DETECTED   Enterobacterales DETECTED (A) NOT DETECTED   Enterobacter cloacae complex NOT DETECTED NOT DETECTED   Escherichia coli DETECTED (A) NOT DETECTED   Klebsiella aerogenes NOT DETECTED NOT DETECTED   Klebsiella oxytoca NOT DETECTED NOT DETECTED   Klebsiella pneumoniae NOT DETECTED NOT DETECTED   Proteus species NOT DETECTED NOT DETECTED   Salmonella species NOT DETECTED NOT DETECTED   Serratia marcescens NOT DETECTED NOT DETECTED   Haemophilus influenzae NOT DETECTED NOT DETECTED   Neisseria meningitidis NOT DETECTED NOT DETECTED   Pseudomonas aeruginosa NOT DETECTED NOT DETECTED   Stenotrophomonas maltophilia NOT DETECTED NOT  DETECTED   Candida albicans NOT DETECTED NOT DETECTED   Candida auris NOT DETECTED NOT DETECTED   Candida glabrata NOT DETECTED NOT DETECTED   Candida krusei NOT DETECTED NOT DETECTED   Candida parapsilosis NOT DETECTED NOT DETECTED   Candida tropicalis NOT DETECTED NOT DETECTED   Cryptococcus neoformans/gattii NOT DETECTED NOT DETECTED   CTX-M ESBL DETECTED (A) NOT DETECTED   Carbapenem resistance IMP NOT DETECTED NOT DETECTED   Carbapenem resistance KPC NOT DETECTED NOT DETECTED   Carbapenem resistance NDM NOT DETECTED NOT DETECTED   Carbapenem resist OXA 48 LIKE NOT DETECTED NOT DETECTED   Carbapenem resistance VIM NOT DETECTED NOT DETECTED    [name] Sour 08/14/2024  5:18 PM

## 2024-08-14 NOTE — Progress Notes (Signed)
 [facility] Progress Note    Assessment/ Plan:   ESRD  -outpatient HD orders: [facility] MWF.  3 hours 45 minutes.  EDW 95 kg.  aVF 15-gauge.  17H dialyzer.  Flow rates: 400/800.  Temp 35.5 C.  2K/3 calcium  bath.  Heparin : None.  Meds: Calcitriol  1 mcg every treatment, Venofer  50 mg once weekly, Mircera 50 mcg every 2 weeks (last dose 9/22) -HD tomorrow per MWF schedule   Fever -workup/mgmt per primary service -MRI right foot with possible OM vs post op changed from toenail removal -currently on zosyn    Volume/ hypotension  -UF as tolerated -continue with midodrine , can repeat dose mid-run on HD -ACTH stim test pending   Anemia of Chronic Kidney Disease Hemoglobin 10.3. Holding iron  for now given infection/need for abx. Resume ESA once Hgb <10 -Transfuse PRN for Hgb <7   Secondary Hyperparathyroidism/Hyperphosphatemia - resume home meds, monitor phos. Renal diet recommended . Resuming calcitriol     Subjective:   Patient seen and examined bedside. He reports feeling well. Tolerated HD yesterday, net UF 2.5L. Had a banana delivered to him for breakfast. He is very cognizant of his renal diet but really doesn't want to be on a renal diet.   Objective:   BP (!) 113/20   Pulse (!) 50   Temp 98.3 F (36.8 C) (Axillary)   Resp 20   Ht 6' 1 (1.854 m)   Wt 96.3 kg   SpO2 99%   BMI 28.01 kg/m   Intake/Output Summary (Last 24 hours) at 08/14/2024 0813 Last data filed at 08/13/2024 1945 Gross per 24 hour  Intake 23 ml  Output 3000 ml  Net -2977 ml   Weight change: 4.145 kg  Physical Exam: Gen: NAD, sitting up in bed CVS: irreg irreg Resp:normal wob, unlabored, speaking in full sentences Abd: soft nt/nd Ext: no edema Neuro: awake, alert Dialysis access: LUE AVF +b/t  Imaging: MR FOOT RIGHT WO CONTRAST Result Date: 08/13/2024 CLINICAL DATA:  Febrile. Recent history of right great toenail removal approximately 2 weeks ago. EXAM: MRI OF THE RIGHT  FOREFOOT WITHOUT CONTRAST TECHNIQUE: Multiplanar, multisequence MR imaging of the right foot was performed. No intravenous contrast was administered. COMPARISON:  Right foot radiographs dated 08/12/2024 FINDINGS: Bones/Joint/Cartilage Postsurgical changes at the level of the right great toe nail bed with irregularity and increased T2/STIR hyperintense signal. There is cortical irregularity with T2/STIR hyperintense signal and T1 hypointensity of the underlying distal phalanx of the great toe, which could reflect postsurgical change versus osteomyelitis. Nondisplaced fracture of the base of the fifth proximal phalanx with associated marrow edema, possibly subacute versus acute. No evidence of intra-articular extension. The remainder of the visualized bones demonstrate normal marrow signal intensity. No joint effusion. Mild degenerative changes of the midfoot with patchy periarticular edema at the third and fourth TMT joints. Ligaments Collateral ligaments are intact.  Lisfranc ligament is intact. Muscles and Tendons Flexor, peroneal and extensor compartment tendons are intact. Increased T2 signal of the intrinsic foot musculature is nonspecific and may reflect chronic denervation changes. Soft tissue Postsurgical changes at the level of the right great toenail bed, as described above. Subcutaneous edema extends along the dorsal foot. No loculated fluid collection. IMPRESSION: 1. Postoperative changes at the level of the right great toenail bed with irregularity and signal abnormality of the underlying distal phalanx of the great toe, which could reflect postsurgical change versus osteomyelitis. No loculated fluid collection. 2. Nondisplaced fracture of the base of the fifth proximal phalanx  with associated marrow edema, possibly subacute versus acute. No evidence of intra-articular extension. 3. Mild degenerative arthropathy of the midfoot. Electronically Signed   By: Harrietta Sherry M.D.   On: 08/13/2024 11:05    DG Chest Port 1 View Result Date: 08/12/2024 EXAM: 1 VIEW(S) XRAY OF THE CHEST 08/12/2024 06:02:00 PM COMPARISON: Prior study 01/16/2023. CLINICAL HISTORY: Questionable sepsis - evaluate for abnormality. Per chart: Pt states he had some Afib last night but began to have chills ever since. Pt noted some blood at tip of his penis. Pt is a dialysis pt. Pt states his lost a toe nail 2 weeks ago. FINDINGS: LUNGS AND PLEURA: Linear scarring in the right mid to lower lung. Right-sided pleural effusion or thickening. Diffuse pulmonary vascular congestion. No focal pulmonary opacity. No pulmonary edema. No pneumothorax. HEART AND MEDIASTINUM: Prominent cardiac silhouette. BONES AND SOFT TISSUES: Metallic foreign bodies overlying the right chest. No acute osseous abnormality. IMPRESSION: 1. Right-sided pleural effusion or thickening. 2. Prominent cardiac silhouette. 3. Diffuse pulmonary vascular congestion. 4. Linear scarring in the right mid to lower lung. Electronically signed by: Fonda Field MD 08/12/2024 06:20 PM EDT RP Workstation: [id number]   DG Foot Complete Right Result Date: 08/12/2024 EXAM: 3 or more VIEW(S) XRAY OF THE RIGHT FOOT 08/12/2024 06:02:00 PM COMPARISON: None available. CLINICAL HISTORY: [id number] Pain [id number]. Per chart: Pt states he had some Afib last night but began to have chills ever since. Pt noted some blood at tip of his penis. Pt is a dialysis pt. Pt states his lost a toe nail 2 weeks ago. FINDINGS: BONES AND JOINTS: Distal 1st phalanx distal cortex irregularity suggesting possible osteomyelitis. This could be confirmed with MRI. No joint dislocation. SOFT TISSUES: Soft tissue air consistent with a defect overlying the great toe. IMPRESSION: 1. Distal 1st phalanx distal cortex irregularity suggesting possible osteomyelitis, potentially related to the reported lost toenail. MRI recommended if indicated to evaluate for osteomyelitis. Electronically signed by: Fonda Field MD 08/12/2024  06:18 PM EDT RP Workstation: [id number]    Labs: BMET Recent Labs  Lab 08/12/24 1814 08/13/24 0440  NA 137 138  K 3.9 4.2  CL 97* 96*  CO2 23 23  GLUCOSE 95 82  BUN 60* 64*  CREATININE 11.32* 11.99*  CALCIUM  7.2* 7.3*   CBC Recent Labs  Lab 08/12/24 1814 08/13/24 0440 08/13/24 0906  WBC 7.9 7.8 8.2  NEUTROABS 6.6  --   --   HGB 10.0* 11.4* 10.3*  HCT 32.2* 36.8* 32.8*  MCV 96.7 97.4 95.3  PLT 153 148* 149*    Medications:     allopurinol  100 mg Oral q AM   amiodarone   200 mg Oral QPM   ascorbic acid  500 mg Oral q AM   aspirin  EC  81 mg Oral q AM   atorvastatin   80 mg Oral QHS   Chlorhexidine  Gluconate Cloth  6 each Topical Q0600   Chlorhexidine  Gluconate Cloth  6 each Topical Q0600   enoxaparin (LOVENOX) injection  30 mg Subcutaneous Q24H   famotidine   20 mg Oral QHS   ferric citrate   210 mg Oral TID WC   metoprolol  succinate  25 mg Oral Daily   midodrine   15 mg Oral TID   multivitamin with minerals  1 tablet Oral q AM   pantoprazole   40 mg Oral BID   tamsulosin   0.4 mg Oral QPM      Ephriam Stank, MD [facility] 08/14/2024, 8:13 AM

## 2024-08-14 NOTE — TOC Initial Note (Signed)
 Transition of Care [facility]) - Initial/Assessment Note    Patient Details  Name: [name] MRN: [id number] Date of Birth: October 16, 1966  [facility] CM/SW Contact:    Mcarthur Saddie Kim, LCSW Phone Number: 08/14/2024, 9:01 AM  Clinical Narrative: Pt admitted with fever. Assessment completed due to high risk readmission score. Pt lives with wife and 3 children. He is fairly independent with ADLs, but wife assists as needed if pt isn't feeling well. He typically drives himself to appointments. Pt's outpatient dialysis is at [facility] on MWF schedule. Plan is for return home when medically stable. TOC will follow.                   Expected Discharge Plan: Home/Self Care Barriers to Discharge: Continued Medical Work up   Patient Goals and CMS Choice Patient states their goals for this hospitalization and ongoing recovery are:: return home   Choice offered to / list presented to : Spouse [place] ownership interest in [facility].provided to::  (n/a)    Expected Discharge Plan and Services       Living arrangements for the past 2 months: Single Family Home                                      Prior Living Arrangements/Services Living arrangements for the past 2 months: Single Family Home Lives with:: Spouse Patient language and need for interpreter reviewed:: Yes Do you feel safe going back to the place where you live?: Yes      Need for Family Participation in Patient Care: No (Comment)     Criminal Activity/Legal Involvement Pertinent to Current Situation/Hospitalization: No - Comment as needed  Activities of Daily Living   ADL Screening (condition at time of admission) Independently performs ADLs?: Yes (appropriate for developmental age) Is the patient deaf or have difficulty hearing?: No Does the patient have difficulty seeing, even when wearing glasses/contacts?: No Does the patient have difficulty concentrating, remembering,  or making decisions?: No  Permission Sought/Granted                  Emotional Assessment         Alcohol  / Substance Use: Not Applicable Psych Involvement: No (comment)  Admission diagnosis:  Fever [R50.9] Patient Active Problem List   Diagnosis Date Noted   PSVT (paroxysmal supraventricular tachycardia) 01/20/2023   Fever 01/20/2023   Wound of left leg 01/20/2023   Bilateral lower leg cellulitis 01/19/2023   Persistent atrial fibrillation (HCC) 01/18/2023   Arterial insufficiency with ischemic ulcer (HCC) 01/17/2023   Sepsis (HCC) 01/10/2023   History of DVT (deep vein thrombosis) 01/10/2023   Chronic diastolic CHF (congestive heart failure) (HCC) 08/28/2022   Hyperkalemia 08/27/2022   Acute deep vein thrombosis (DVT) of distal vein of left lower extremity (HCC) 08/27/2022   Hypotension 08/27/2022   Mixed hyperlipidemia 08/27/2022   OSA on CPAP 08/27/2022   Vitamin D  deficiency 08/27/2022   BPH (benign prostatic hyperplasia) 08/27/2022   GI bleed 08/27/2021   Transfusion-dependent anemia    Gastrointestinal hemorrhage with melena    Hypovolemic shock (HCC)    AVM (arteriovenous malformation) of duodenum, acquired with hemorrhage    S/P coronary artery stent placement    Acute upper GI bleed 03/30/2021   ESRD on dialysis [facility])    Colon cancer screening 09/03/2020   GERD (gastroesophageal reflux disease) 09/03/2020   Hematoma  of left lower extremity    Swelling of calf    Paroxysmal atrial fibrillation (HCC) 11/15/2016   Renal insufficiency 11/15/2016   Severe anemia 11/15/2016   Atrial fibrillation with rapid ventricular response (HCC) 11/15/2016   Cellulitis 11/15/2016   Fever of unknown origin    PCP:  Maree Virgilio SAUNDERS, MD Pharmacy:   [facility] - [place], [place] - [phone] [address] [place] [address] Phone: [phone] Fax: [phone]  [facility] [phone] - [place], [place] - [address] [place] [address] Phone: [phone] Fax: [phone]     Social Drivers of Health (SDOH) Social History: SDOH Screenings   Food Insecurity: No Food Insecurity (08/12/2024)  Housing: Low Risk  (08/12/2024)  Transportation Needs: No Transportation Needs (08/12/2024)  Utilities: Not At Risk (08/12/2024)  Depression (PHQ2-9): Low Risk  (08/25/2023)  Tobacco Use: Low Risk  (08/12/2024)   SDOH Interventions:     Readmission Risk Interventions    01/25/2023   11:36 AM  Readmission Risk Prevention Plan  Transportation Screening Complete  Medication Review (RN Care Manager) Complete  PCP or Specialist appointment within 3-5 days of discharge Complete  HRI or Home Care Consult Complete  SW Recovery Care/Counseling Consult Complete  Palliative Care Screening Not Applicable  Skilled Nursing Facility Not Applicable

## 2024-08-14 NOTE — Progress Notes (Addendum)
 Date and time results received: 08/14/24 1155 (use smartphrase .now to insert current time)  Test: Blood Culture Bottle  Critical Value: Anaerobic Bottle Gram Negative Rods  Name of Provider Notified: Dr Ricky  Orders Received? Or Actions Taken?: New orders for [place]

## 2024-08-15 DIAGNOSIS — A4151 Sepsis due to Escherichia coli [E. coli]: Secondary | ICD-10-CM | POA: Diagnosis not present

## 2024-08-15 DIAGNOSIS — I953 Hypotension of hemodialysis: Secondary | ICD-10-CM

## 2024-08-15 LAB — RENAL FUNCTION PANEL
Albumin: 3.6 g/dL (ref 3.5–5.0)
Anion gap: 17 — ABNORMAL HIGH (ref 5–15)
BUN: 49 mg/dL — ABNORMAL HIGH (ref 6–20)
CO2: 25 mmol/L (ref 22–32)
Calcium: 7.6 mg/dL — ABNORMAL LOW (ref 8.9–10.3)
Chloride: 92 mmol/L — ABNORMAL LOW (ref 98–111)
Creatinine, Ser: 12.4 mg/dL — ABNORMAL HIGH (ref 0.61–1.24)
GFR, Estimated: 4 mL/min — ABNORMAL LOW (ref >60)
Glucose, Bld: 106 mg/dL — ABNORMAL HIGH (ref 70–99)
Phosphorus: 2.4 mg/dL — ABNORMAL LOW (ref 2.5–4.6)
Potassium: 3.4 mmol/L — ABNORMAL LOW (ref 3.5–5.1)
Sodium: 134 mmol/L — ABNORMAL LOW (ref 135–145)

## 2024-08-15 LAB — CBC
HCT: 33.1 % — ABNORMAL LOW (ref 39.0–52.0)
Hemoglobin: 10.3 g/dL — ABNORMAL LOW (ref 13.0–17.0)
MCH: 29.9 pg (ref 26.0–34.0)
MCHC: 31.1 g/dL (ref 30.0–36.0)
MCV: 95.9 fL (ref 80.0–100.0)
Platelets: 152 K/uL (ref 150–400)
RBC: 3.45 MIL/uL — ABNORMAL LOW (ref 4.22–5.81)
RDW: 16.3 % — ABNORMAL HIGH (ref 11.5–15.5)
WBC: 9.5 K/uL (ref 4.0–10.5)
nRBC: 0 % (ref 0.0–0.2)

## 2024-08-15 MED ORDER — METOPROLOL SUCCINATE ER 25 MG PO TB24
12.5000 mg | ORAL_TABLET | Freq: Every day | ORAL | Status: DC
  Filled 2024-08-15 (×2): qty 1

## 2024-08-15 MED ORDER — SODIUM CHLORIDE 0.9 % IV SOLN
1.0000 g | Freq: Once | INTRAVENOUS | Status: DC
  Filled 2024-08-15: qty 20

## 2024-08-15 MED ORDER — SODIUM CHLORIDE 0.9 % IV SOLN
1.0000 g | Freq: Once | INTRAVENOUS | Status: AC
  Administered 2024-08-15: 1 g via INTRAVENOUS
  Filled 2024-08-15 (×2): qty 20

## 2024-08-15 MED ORDER — MIDODRINE HCL 5 MG PO TABS
ORAL_TABLET | ORAL | Status: AC
  Filled 2024-08-15: qty 3

## 2024-08-15 MED ORDER — ACETAMINOPHEN 325 MG PO TABS
ORAL_TABLET | ORAL | Status: AC
  Filled 2024-08-15: qty 2

## 2024-08-15 MED ORDER — ALBUMIN HUMAN 25 % IV SOLN
INTRAVENOUS | Status: AC
  Filled 2024-08-15: qty 100

## 2024-08-15 MED ORDER — CALCITRIOL 0.25 MCG PO CAPS
ORAL_CAPSULE | ORAL | Status: AC
  Filled 2024-08-15: qty 4

## 2024-08-15 NOTE — Plan of Care (Signed)
  Problem: Clinical Measurements: Goal: Ability to maintain clinical measurements within normal limits will improve Outcome: Progressing Goal: Will remain free from infection Outcome: Progressing Goal: Diagnostic test results will improve Outcome: Progressing Goal: Respiratory complications will improve Outcome: Progressing Goal: Cardiovascular complication will be avoided Outcome: Progressing   Problem: Nutrition: Goal: Adequate nutrition will be maintained Outcome: Progressing   Problem: Coping: Goal: Level of anxiety will decrease Outcome: Progressing   Problem: Elimination: Goal: Will not experience complications related to bowel motility Outcome: Progressing Goal: Will not experience complications related to urinary retention Outcome: Progressing   Problem: Safety: Goal: Ability to remain free from injury will improve Outcome: Progressing

## 2024-08-15 NOTE — Progress Notes (Signed)
 PROGRESS NOTE    Patient: [name]                            PCP: Maree Virgilio SAUNDERS, MD                    DOB: 1966-11-10            DOA: 08/12/2024 FMW:[id number]             DOS: 08/15/2024, 12:31 PM   LOS: 2 days   Date of Service: The patient was seen and examined on 08/15/2024  Subjective:   The patient was seen and examined this morning. Hypertensive otherwise stable, and hemodialysis awake alert oriented afebrile Heart rate at 50s denies any chest pain, satting 98% on room air  Brief Narrative:   Auston Halfmann is a 58 y.o. 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    Patient has been at his usual stat of health until yesterday when he noted palpitations, consistent with his atrial fibrillation. Symptoms self resolved but then noted fever and chills, prompting him to come to the hospital.  He mentions every time is atrial fibrillation becomes symptomatic and he develops fever he usually has an infection.  His last HD was on Friday, 48 hrs ago, with no complications. He has a left upper extremity fistula.  Currently he is in the process of getting into the renal transplant list in [name].  Recently he had a toe nail removed  2 weeks ago, on his left foot, by podiatry.    Denies any maise or generalized weakness, no nausea or vomiting, no rashes, chest pain or dyspnea.      Assessment & Plan:   Principal Problem:   Fever Active Problems:   Paroxysmal atrial fibrillation (HCC)   Chronic diastolic CHF (congestive heart failure) (HCC)   S/P coronary artery stent placement   ESRD on dialysis (HCC)   Mixed hyperlipidemia   BPH (benign prostatic hyperplasia)   OSA on CPAP   GERD (gastroesophageal reflux disease)      Assessment and  plan  Fever/presumed osteomyelitis and Bacteremia - With concern for right great toe osteomyelitis - Blood cultures growing E. coli/Enterobacter -Pending repeat cultures -Current antibiotics Zyvox , meropenem IV  -On hemodialysis today -Hypertensive otherwise stable   - MRI suggesting the presence of possible osteomyelitis but unable to confirm it.  - Uric acid 5.7 (essentially ruling out component of gout). - There were also positive blood culture for gram-negative rods; with BCID for ESBL E. Coli, meropenem has been started,    Paroxysmal atrial fibrillation (HCC) -Will continue rate control with amiodarone  and metoprolol   - Not on chronic anticoagulation.  History of Watchman procedure per patient - Continue telemetry monitoring.   Chronic diastolic CHF (congestive heart failure) (HCC) -Stable and compensated - Continue volume management with hemodialysis - Follow daily weights/strict I's and O's - Low-sodium diet discussed with patient.   S/P coronary artery stent placement -No chest pain or shortness of breath - Continue aspirin , metoprolol  and statin - Continue patient follow-up with cardiology service.   ESRD on dialysis [facility])- HD MWF -Nephrology service have been consulted - Will follow recommendations regarding continuation of dialysis treatments -On hemodialysis today    Mixed hyperlipidemia -Continue statin.   BPH (benign  prostatic hyperplasia) -Continue treatment with Flomax .   OSA on CPAP -Continue CPAP nightly.   GERD (gastroesophageal reflux disease) -Continue PPI.     -------------------------------------------------------------------------------------------------------------------------------- Nutritional status:  The patient's BMI is: Body mass index is 28.07 kg/m. I agree with the assessment and plan as outlined     ---------------------------------------------------------------------------------------------------------------------------- Cultures; Blood Cultures x 2 >> coli, ESBL, Enterobacter Repeat blood cultures 08/14/2024>>    ------------------------------------------------------------------------------------------------------------------------------------------------  DVT prophylaxis:  enoxaparin (LOVENOX) injection 30 mg Start: 08/14/24 1000 SCDs Start: 08/12/24 2306   Code Status:   Code Status: Full Code  Family Communication: No family member present at bedside-  -Advance care planning has been discussed.   Admission status:   Status is: Inpatient Remains inpatient appropriate because: Needing IV antibiotics, treating bacteremia, possible to myelitis   Disposition: From  - home             Planning for discharge in 1-2 days   Procedures:   No admission procedures for hospital encounter.   Antimicrobials:  Anti-infectives (From admission, onward)    Start     Dose/Rate Route Frequency Ordered Stop   08/15/24 1230  meropenem (MERREM) 1 g in sodium chloride  0.9 % 100 mL IVPB        1 g 200 mL/hr over 30 Minutes Intravenous  Once 08/15/24 0948     08/15/24 0845  meropenem (MERREM) 1 g in sodium chloride  0.9 % 100 mL IVPB  Status:  Discontinued        1 g 200 mL/hr over 30 Minutes Intravenous  Once 08/15/24 0749 08/15/24 0948   08/14/24 1815  meropenem (MERREM) 1 g in sodium chloride  0.9 % 100 mL IVPB        1 g 200 mL/hr over 30 Minutes Intravenous Every 24 hours 08/14/24 1721     08/14/24 1300  cefTRIAXone  (ROCEPHIN ) 2 g in sodium chloride  0.9 % 100 mL IVPB  Status:  Discontinued        2 g 200 mL/hr over 30 Minutes Intravenous Every 24 hours 08/14/24 1200 08/14/24 1721   08/13/24 2200  linezolid  (ZYVOX ) IVPB 600 mg        600 mg 300 mL/hr over 60 Minutes Intravenous Every 12 hours 08/13/24 1714     08/12/24 1945  Ampicillin-Sulbactam (UNASYN) 3 g in sodium chloride   0.9 % 100 mL IVPB       Placed in And Linked Group   3 g 200 mL/hr over 30 Minutes Intravenous  Once 08/12/24 1936 08/12/24 2024   08/12/24 1945  linezolid  (ZYVOX ) tablet 600 mg       Placed in And Linked Group   600 mg Oral  Once 08/12/24 1936 08/12/24 2022        Medication:   allopurinol  100 mg Oral q AM   amiodarone   200 mg Oral QPM   ascorbic acid  500 mg Oral q AM   aspirin  EC  81 mg Oral q AM   atorvastatin   80 mg Oral QHS   calcitRIOL   1 mcg Oral Q M,W,F-HD   Chlorhexidine  Gluconate Cloth  6 each Topical Q0600   enoxaparin (LOVENOX) injection  30 mg Subcutaneous Q24H   famotidine   20 mg Oral QHS   ferric citrate   210 mg Oral TID WC   metoprolol  succinate  12.5 mg Oral Daily   midodrine   15 mg Oral TID   multivitamin with minerals  1 tablet Oral q AM   pantoprazole   40 mg Oral BID  tamsulosin   0.4 mg Oral QPM    acetaminophen  **OR** acetaminophen , alteplase , alum & mag hydroxide-simeth, anticoagulant sodium citrate , cyclobenzaprine, heparin , lidocaine  (PF), lidocaine -prilocaine , loperamide , ondansetron  **OR** ondansetron  (ZOFRAN ) IV, mouth rinse, pentafluoroprop-tetrafluoroeth   Objective:   Vitals:   08/15/24 1015 08/15/24 1030 08/15/24 1045 08/15/24 1130  BP: (!) 86/57 96/60 97/65  102/66  Pulse: (!) 51 (!) 51 (!) 49 (!) 57  Resp: 14 16 12 20   Temp:      TempSrc:      SpO2: 100% 100% 100% 98%  Weight:      Height:        Intake/Output Summary (Last 24 hours) at 08/15/2024 1231 Last data filed at 08/14/2024 1549 Gross per 24 hour  Intake 694.52 ml  Output --  Net 694.52 ml   Filed Weights   08/13/24 2000 08/14/24 1611 08/15/24 0857  Weight: 96.3 kg 96.6 kg 96.5 kg     Physical examination:   General:  AAO x 3,  cooperative, no distress;   HEENT:  Normocephalic, PERRL, otherwise with in Normal limits   Neuro:  CNII-XII intact. , normal motor and sensation, reflexes intact   Lungs:   Clear to auscultation BL, Respirations unlabored,  No  wheezes / crackles  Cardio:    S1/S2, RRR, No murmure, No Rubs or Gallops   Abdomen:  Soft, non-tender, bowel sounds active all four quadrants, no guarding or peritoneal signs.  Muscular  skeletal:  Limited exam -global generalized weaknesses - in bed, able to move all 4 extremities,   2+ pulses,  symmetric, No pitting edema  Skin:  Dry, warm to touch, negative for any Rashes,  Wounds: Please see nursing documentation       ------------------------------------------------------------------------------------------------------------------------------------------    LABs:     Latest Ref Rng & Units 08/15/2024    8:50 AM 08/13/2024    9:06 AM 08/13/2024    4:40 AM  CBC  WBC 4.0 - 10.5 K/uL 9.5  8.2  7.8   Hemoglobin 13.0 - 17.0 g/dL 89.6  89.6  88.5   Hematocrit 39.0 - 52.0 % 33.1  32.8  36.8   Platelets 150 - 400 K/uL 152  149  148       Latest Ref Rng & Units 08/15/2024    8:50 AM 08/13/2024    4:40 AM 08/12/2024    6:14 PM  CMP  Glucose 70 - 99 mg/dL 893  82  95   BUN 6 - 20 mg/dL 49  64  60   Creatinine 0.61 - 1.24 mg/dL 87.59  88.00  88.67   Sodium 135 - 145 mmol/L 134  138  137   Potassium 3.5 - 5.1 mmol/L 3.4  4.2  3.9   Chloride 98 - 111 mmol/L 92  96  97   CO2 22 - 32 mmol/L 25  23  23    Calcium  8.9 - 10.3 mg/dL 7.6  7.3  7.2   Total Protein 6.5 - 8.1 g/dL   6.8   Total Bilirubin 0.0 - 1.2 mg/dL   1.1   Alkaline Phos 38 - 126 U/L   161   AST 15 - 41 U/L   40   ALT 0 - 44 U/L   31        Micro Results Recent Results (from the past 240 hours)  Resp panel by RT-PCR (RSV, Flu A&B, Covid) Anterior Nasal Swab     Status: None   Collection Time: 08/12/24  5:52 PM   Specimen: Anterior  Nasal Swab  Result Value Ref Range Status   SARS Coronavirus 2 by RT PCR NEGATIVE NEGATIVE Final    Comment: (NOTE) SARS-CoV-2 target nucleic acids are NOT DETECTED.  The SARS-CoV-2 RNA is generally detectable in upper respiratory specimens during the acute phase of infection. The  lowest concentration of SARS-CoV-2 viral copies this assay can detect is 138 copies/mL. A negative result does not preclude SARS-Cov-2 infection and should not be used as the sole basis for treatment or other patient management decisions. A negative result may occur with  improper specimen collection/handling, submission of specimen other than nasopharyngeal swab, presence of viral mutation(s) within the areas targeted by this assay, and inadequate number of viral copies(<138 copies/mL). A negative result must be combined with clinical observations, patient history, and epidemiological information. The expected result is Negative.  Fact Sheet for Patients:  BloggerCourse.com  Fact Sheet for Healthcare Providers:  SeriousBroker.it  This test is no t yet approved or cleared by the [place]  FDA and  has been authorized for detection and/or diagnosis of SARS-CoV-2 by FDA under an Emergency Use Authorization (EUA). This EUA will remain  in effect (meaning this test can be used) for the duration of the COVID-19 declaration under Section 564(b)(1) of the Act, 21 U.S.C.section 360bbb-3(b)(1), unless the authorization is terminated  or revoked sooner.       Influenza A by PCR NEGATIVE NEGATIVE Final   Influenza B by PCR NEGATIVE NEGATIVE Final    Comment: (NOTE) The Xpert Xpress SARS-CoV-2/FLU/RSV plus assay is intended as an aid in the diagnosis of influenza from Nasopharyngeal swab specimens and should not be used as a sole basis for treatment. Nasal washings and aspirates are unacceptable for Xpert Xpress SARS-CoV-2/FLU/RSV testing.  Fact Sheet for Patients: BloggerCourse.com  Fact Sheet for Healthcare Providers: SeriousBroker.it  This test is not yet approved or cleared by the [place]  FDA and has been authorized for detection and/or diagnosis of SARS-CoV-2 by FDA under  an Emergency Use Authorization (EUA). This EUA will remain in effect (meaning this test can be used) for the duration of the COVID-19 declaration under Section 564(b)(1) of the Act, 21 U.S.C. section 360bbb-3(b)(1), unless the authorization is terminated or revoked.     Resp Syncytial Virus by PCR NEGATIVE NEGATIVE Final    Comment: (NOTE) Fact Sheet for Patients: BloggerCourse.com  Fact Sheet for Healthcare Providers: SeriousBroker.it  This test is not yet approved or cleared by the [place]  FDA and has been authorized for detection and/or diagnosis of SARS-CoV-2 by FDA under an Emergency Use Authorization (EUA). This EUA will remain in effect (meaning this test can be used) for the duration of the COVID-19 declaration under Section 564(b)(1) of the Act, 21 U.S.C. section 360bbb-3(b)(1), unless the authorization is terminated or revoked.  Performed at [facility], [address], [place], [place] [address]   Blood Culture (routine x 2)     Status: Abnormal (Preliminary result)   Collection Time: 08/12/24  6:14 PM   Specimen: BLOOD RIGHT FOREARM  Result Value Ref Range Status   Specimen Description BLOOD RIGHT FOREARM  Final   Special Requests   Final    BOTTLES DRAWN AEROBIC AND ANAEROBIC Blood Culture adequate volume   Culture  Setup Time   Final    ANAEROBIC BOTTLE ONLY GRAM NEGATIVE RODS Gram Stain Report Called to,Read Back By and Verified With: J KING AT 1154 ON 09.30.25 BY ADGER J  CRITICAL RESULT CALLED TO, READ BACK BY AND VERIFIED WITH: PHARMD  S. HURTH [id number] @ 1715 FH    Culture (A)  Final    ESCHERICHIA COLI SUSCEPTIBILITIES TO FOLLOW Performed at [facility], [address], [place], [place] [address]    Report Status PENDING  Incomplete  Blood Culture ID Panel (Reflexed)     Status: Abnormal   Collection Time: 08/12/24  6:14 PM  Result Value Ref Range Status   Enterococcus faecalis NOT DETECTED  NOT DETECTED Final   Enterococcus Faecium NOT DETECTED NOT DETECTED Final   Listeria monocytogenes NOT DETECTED NOT DETECTED Final   Staphylococcus species NOT DETECTED NOT DETECTED Final   Staphylococcus aureus (BCID) NOT DETECTED NOT DETECTED Final   Staphylococcus epidermidis NOT DETECTED NOT DETECTED Final   Staphylococcus lugdunensis NOT DETECTED NOT DETECTED Final   Streptococcus species NOT DETECTED NOT DETECTED Final   Streptococcus agalactiae NOT DETECTED NOT DETECTED Final   Streptococcus pneumoniae NOT DETECTED NOT DETECTED Final   Streptococcus pyogenes NOT DETECTED NOT DETECTED Final   A.calcoaceticus-baumannii NOT DETECTED NOT DETECTED Final   Bacteroides fragilis NOT DETECTED NOT DETECTED Final   Enterobacterales DETECTED (A) NOT DETECTED Final    Comment: Enterobacterales represent a large order of gram negative bacteria, not a single organism. CRITICAL RESULT CALLED TO, READ BACK BY AND VERIFIED WITH: PHARMD S. HURTH [id number] @ 1715 FH    Enterobacter cloacae complex NOT DETECTED NOT DETECTED Final   Escherichia coli DETECTED (A) NOT DETECTED Final    Comment: CRITICAL RESULT CALLED TO, READ BACK BY AND VERIFIED WITH: PHARMD S. HURTH [id number] @ 1715 FH    Klebsiella aerogenes NOT DETECTED NOT DETECTED Final   Klebsiella oxytoca NOT DETECTED NOT DETECTED Final   Klebsiella pneumoniae NOT DETECTED NOT DETECTED Final   Proteus species NOT DETECTED NOT DETECTED Final   Salmonella species NOT DETECTED NOT DETECTED Final   Serratia marcescens NOT DETECTED NOT DETECTED Final   Haemophilus influenzae NOT DETECTED NOT DETECTED Final   Neisseria meningitidis NOT DETECTED NOT DETECTED Final   Pseudomonas aeruginosa NOT DETECTED NOT DETECTED Final   Stenotrophomonas maltophilia NOT DETECTED NOT DETECTED Final   Candida albicans NOT DETECTED NOT DETECTED Final   Candida auris NOT DETECTED NOT DETECTED Final   Candida glabrata NOT DETECTED NOT DETECTED Final   Candida krusei NOT  DETECTED NOT DETECTED Final   Candida parapsilosis NOT DETECTED NOT DETECTED Final   Candida tropicalis NOT DETECTED NOT DETECTED Final   Cryptococcus neoformans/gattii NOT DETECTED NOT DETECTED Final   CTX-M ESBL DETECTED (A) NOT DETECTED Final    Comment: CRITICAL RESULT CALLED TO, READ BACK BY AND VERIFIED WITH: PHARMD S. HURTH [id number] @ 1715 FH (NOTE) Extended spectrum beta-lactamase detected. Recommend a carbapenem as initial therapy.      Carbapenem resistance IMP NOT DETECTED NOT DETECTED Final   Carbapenem resistance KPC NOT DETECTED NOT DETECTED Final   Carbapenem resistance NDM NOT DETECTED NOT DETECTED Final   Carbapenem resist OXA 48 LIKE NOT DETECTED NOT DETECTED Final   Carbapenem resistance VIM NOT DETECTED NOT DETECTED Final    Comment: Performed at [facility], [address] [place], [place] [address]  Blood Culture (routine x 2)     Status: None (Preliminary result)   Collection Time: 08/12/24  6:24 PM   Specimen: BLOOD RIGHT ARM  Result Value Ref Range Status   Specimen Description BLOOD RIGHT ARM  Final   Special Requests NONE  Final   Culture   Final    NO GROWTH 3 DAYS  Performed at [facility], [address] [place], [place] [address]    Report Status PENDING  Incomplete  MRSA Next Gen by PCR, Nasal     Status: None   Collection Time: 08/12/24 10:17 PM   Specimen: Nasal Mucosa; Nasal Swab  Result Value Ref Range Status   MRSA by PCR Next Gen NOT DETECTED NOT DETECTED Final    Comment: (NOTE) The GeneXpert MRSA Assay (FDA approved for NASAL specimens only), is one component of a comprehensive MRSA colonization surveillance program. It is not intended to diagnose MRSA infection nor to guide or monitor treatment for MRSA infections. Test performance is not FDA approved in patients less than 61 years old. Performed at [facility], [address], [place], [place] [address]     Radiology Reports No results found.  SIGNED: Adriana DELENA Grams,  MD, FHM. FAAFP. Jolynn Pack - [facility] Time spent - 55 min.  In seeing, evaluating and examining the patient. Reviewing medical records, labs, drawn plan of care. [facility],  Pager (please use [email or web address] to page/ text) Please use Epic Secure Chat for non-urgent communication (7AM-7PM)  If 7PM-7AM, please contact night-coverage [email or web address], 08/15/2024, 12:31 PM

## 2024-08-15 NOTE — Progress Notes (Signed)
 Pt slept well throughout the night, however patient has had on going complaints of indigestion, hiccups, and acid reflux since yesterday afternoon; Maalox/Mylanta administered twice this shift for indigestion.

## 2024-08-15 NOTE — Progress Notes (Signed)
   08/15/24 2225  BiPAP/CPAP/SIPAP  BiPAP/CPAP/SIPAP Pt Type Adult  BiPAP/CPAP/SIPAP Resmed  Mask Type Full face mask  Dentures removed? Not applicable  Mask Size Medium  FiO2 (%) 21 %  Patient Home Machine Yes  Safety Check Completed by RT for Home Unit Yes, no issues noted  Patient Home Mask Yes  Patient Home Tubing Yes  Auto Titrate Yes  Minimum cmH2O 11 cmH2O  Maximum cmH2O 15 cmH2O  Device Plugged into RED Power Outlet Yes

## 2024-08-15 NOTE — Plan of Care (Signed)

## 2024-08-15 NOTE — Procedures (Signed)
 HD Note:  Some information was entered later than the data was gathered due to patient care needs. The stated time with the data is accurate.   Transported patient in bed to unit.  Dr. Willette came to see patient just prior to the beginning of treatment and was notified that the patient had a QCT above 500.   Alert and oriented.   Informed consent signed and in chart.   Access used: left forearm fistula Access issues: no issues  Patient given albumin  and midodrine  during treatment to support BP.  TX duration: 3.25 hours  Alert, without acute distress.  Hand-off given to patient's nurse.   Transported back to the room   Juandaniel Manfredo L. Lenon, RN Kidney Dialysis Unit.

## 2024-08-15 NOTE — Hospital Course (Addendum)
 [name] is a 58 y.o. 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    Patient has been at his usual stat of health until yesterday when he noted palpitations, consistent with his atrial fibrillation. Symptoms self resolved but then noted fever and chills, prompting him to come to the hospital.  He mentions every time is atrial fibrillation becomes symptomatic and he develops fever he usually has an infection.  His last HD was on Friday, 48 hrs ago, with no complications. He has a left upper extremity fistula.  Currently he is in the process of getting into the renal transplant list in [name].  Recently he had a toe nail removed  2 weeks ago, on his left foot, by podiatry.    Denies any maise or generalized weakness, no nausea or vomiting, no rashes, chest pain or dyspnea.    Assessment & Plan:   Principal Problem:   Fever Active Problems:   Paroxysmal atrial fibrillation (HCC)   Chronic diastolic CHF (congestive heart failure) (HCC)   S/P coronary artery stent placement   ESRD on dialysis (HCC)   Mixed hyperlipidemia   BPH (benign prostatic hyperplasia)   OSA on CPAP   GERD (gastroesophageal reflux disease)   Assessment and plan  Bacteremia, questionable osteomyelitis Fever with presumed osteomyelitis and Bacteremia  - With concern for right great toe osteomyelitis - Blood cultures growing E. coli/Enterobacter -ESBL - Repeat blood cultures-no growth to date -Current antibiotics Zyvox , meropenem   Consulted ID Dr. Overton Appreciate further evaluation recommendations   - MRI suggesting the presence of possible osteomyelitis but unable to confirm it.  - Uric acid 5.7 (essentially ruling out component of gout). -    Paroxysmal atrial fibrillation (HCC) -Will  continue rate control with amiodarone  and metoprolol   - Not on chronic anticoagulation.  History of Watchman procedure per patient    Chronic diastolic CHF (congestive heart failure) (HCC) -Stable and compensated -Fluid management with hemodialysis - Follow daily weights/strict I's and O's - Low-sodium diet discussed with patient.   S/P coronary artery stent placement -Stable denies any chest pain or shortness of breath - Continue aspirin , metoprolol  and statin    ESRD on dialysis [facility])- HD MWF -Nephrology service have been consulted - Will follow recommendations regarding continuation of dialysis treatments -On hemodialysis 08/15/2024,    Mixed hyperlipidemia -Continue statin.   BPH (benign prostatic hyperplasia) -Continue treatment with Flomax .   OSA on CPAP -Continue CPAP nightly.   GERD (gastroesophageal reflux disease) -Continue PPI.

## 2024-08-15 NOTE — Progress Notes (Signed)
 [facility] Progress Note   Outpatient HD orders: [facility] MWF.  3 hours 45 minutes.  EDW 95 kg.  aVF 15-gauge.  17H dialyzer.  Flow rates: 400/800.  Temp 35.5 C.  2K/3 calcium  bath.  Heparin : None.  Meds: Calcitriol  1 mcg every treatment, Venofer  50 mg once weekly, Mircera 50 mcg every 2 weeks (last dose 9/22)  Assessment/ Plan:   ESRD   -HD today per MWF schedule; still having diarrhea and will have a floor scale for more accurate UF determination. D/W nursing staff and they will enter in the flowsheet.   Fever -workup/mgmt per primary service -MRI right foot with possible OM vs post op changed from toenail removal -currently on Merrem and Zyvox    Volume/ hypotension  -UF as tolerated (may need to limit his weight today; his dry weight is 95 kg) -continue with midodrine , can repeat dose mid-run on HD -ACTH stim test pending   Anemia of Chronic Kidney Disease Hemoglobin 10.3. Holding iron  for now given infection/need for abx. Resume ESA once Hgb <10 -Transfuse PRN for Hgb <7   Secondary Hyperparathyroidism/Hyperphosphatemia - resume home meds, monitor phos. Renal diet recommended . Resuming calcitriol .  A lot of junk food, processed foods bedside.  Patient blames it on his wife who is packing the snacks for him.    Subjective:   Patient seen and examined bedside. He reports ongoing diarrhea, poor appetite. Tolerated HD Monday, net UF 2.5L. Had a banana delivered to him for breakfast. He is very cognizant of his renal diet but really doesn't want to be on a renal diet.   Objective:   BP (!) 81/44   Pulse (!) 51   Temp 98.8 F (37.1 C) (Oral)   Resp 20   Ht 6' 1 (1.854 m)   Wt 96.5 kg   SpO2 96%   BMI 28.07 kg/m   Intake/Output Summary (Last 24 hours) at 08/15/2024 0900 Last data filed at 08/14/2024 1549 Gross per 24 hour  Intake 694.52 ml  Output --  Net 694.52 ml   Weight change: -2.784 kg  Physical Exam: Gen: NAD, sitting up in bed CVS:  irreg irreg Resp:normal wob, unlabored, speaking in full sentences Abd: soft nt/nd Ext: no edema Neuro: awake, alert Dialysis access: LUE Cimino  +b/t  Imaging: MR FOOT RIGHT WO CONTRAST Result Date: 08/13/2024 CLINICAL DATA:  Febrile. Recent history of right great toenail removal approximately 2 weeks ago. EXAM: MRI OF THE RIGHT FOREFOOT WITHOUT CONTRAST TECHNIQUE: Multiplanar, multisequence MR imaging of the right foot was performed. No intravenous contrast was administered. COMPARISON:  Right foot radiographs dated 08/12/2024 FINDINGS: Bones/Joint/Cartilage Postsurgical changes at the level of the right great toe nail bed with irregularity and increased T2/STIR hyperintense signal. There is cortical irregularity with T2/STIR hyperintense signal and T1 hypointensity of the underlying distal phalanx of the great toe, which could reflect postsurgical change versus osteomyelitis. Nondisplaced fracture of the base of the fifth proximal phalanx with associated marrow edema, possibly subacute versus acute. No evidence of intra-articular extension. The remainder of the visualized bones demonstrate normal marrow signal intensity. No joint effusion. Mild degenerative changes of the midfoot with patchy periarticular edema at the third and fourth TMT joints. Ligaments Collateral ligaments are intact.  Lisfranc ligament is intact. Muscles and Tendons Flexor, peroneal and extensor compartment tendons are intact. Increased T2 signal of the intrinsic foot musculature is nonspecific and may reflect chronic denervation changes. Soft tissue Postsurgical changes at the level of the right great  toenail bed, as described above. Subcutaneous edema extends along the dorsal foot. No loculated fluid collection. IMPRESSION: 1. Postoperative changes at the level of the right great toenail bed with irregularity and signal abnormality of the underlying distal phalanx of the great toe, which could reflect postsurgical change versus  osteomyelitis. No loculated fluid collection. 2. Nondisplaced fracture of the base of the fifth proximal phalanx with associated marrow edema, possibly subacute versus acute. No evidence of intra-articular extension. 3. Mild degenerative arthropathy of the midfoot. Electronically Signed   By: Harrietta Sherry M.D.   On: 08/13/2024 11:05    Labs: BMET Recent Labs  Lab 08/12/24 1814 08/13/24 0440  NA 137 138  K 3.9 4.2  CL 97* 96*  CO2 23 23  GLUCOSE 95 82  BUN 60* 64*  CREATININE 11.32* 11.99*  CALCIUM  7.2* 7.3*   CBC Recent Labs  Lab 08/12/24 1814 08/13/24 0440 08/13/24 0906  WBC 7.9 7.8 8.2  NEUTROABS 6.6  --   --   HGB 10.0* 11.4* 10.3*  HCT 32.2* 36.8* 32.8*  MCV 96.7 97.4 95.3  PLT 153 148* 149*    Medications:     allopurinol  100 mg Oral q AM   amiodarone   200 mg Oral QPM   ascorbic acid  500 mg Oral q AM   aspirin  EC  81 mg Oral q AM   atorvastatin   80 mg Oral QHS   calcitRIOL   1 mcg Oral Q M,W,F-HD   Chlorhexidine  Gluconate Cloth  6 each Topical Q0600   enoxaparin (LOVENOX) injection  30 mg Subcutaneous Q24H   famotidine   20 mg Oral QHS   ferric citrate   210 mg Oral TID WC   metoprolol  succinate  12.5 mg Oral Daily   midodrine   15 mg Oral TID   multivitamin with minerals  1 tablet Oral q AM   pantoprazole   40 mg Oral BID   tamsulosin   0.4 mg Oral QPM

## 2024-08-16 ENCOUNTER — Other Ambulatory Visit: Payer: Self-pay

## 2024-08-16 DIAGNOSIS — Z992 Dependence on renal dialysis: Secondary | ICD-10-CM | POA: Diagnosis not present

## 2024-08-16 DIAGNOSIS — R7881 Bacteremia: Secondary | ICD-10-CM

## 2024-08-16 DIAGNOSIS — R509 Fever, unspecified: Secondary | ICD-10-CM | POA: Diagnosis not present

## 2024-08-16 DIAGNOSIS — M8600 Acute hematogenous osteomyelitis, unspecified site: Secondary | ICD-10-CM

## 2024-08-16 DIAGNOSIS — B9629 Other Escherichia coli [E. coli] as the cause of diseases classified elsewhere: Secondary | ICD-10-CM | POA: Diagnosis not present

## 2024-08-16 DIAGNOSIS — N186 End stage renal disease: Secondary | ICD-10-CM | POA: Diagnosis not present

## 2024-08-16 LAB — CULTURE, BLOOD (ROUTINE X 2)
Culture  Setup Time: NO GROWTH
Special Requests: ADEQUATE

## 2024-08-16 MED ORDER — SODIUM CHLORIDE 0.9 % IV SOLN
500.0000 mg | INTRAVENOUS | Status: DC
  Administered 2024-08-16 – 2024-08-17 (×2): 500 mg via INTRAVENOUS
  Filled 2024-08-16 (×2): qty 500

## 2024-08-16 MED ORDER — ERTAPENEM IV (FOR PTA / DISCHARGE USE ONLY): 500.0000 mg | INTRAVENOUS | 0 refills | Status: DC

## 2024-08-16 NOTE — Progress Notes (Signed)
 PROGRESS NOTE    Patient: [name]                            PCP: Maree Virgilio SAUNDERS, MD                    DOB: 05/08/1966            DOA: 08/12/2024 FMW:[id number]             DOS: 08/16/2024, 12:02 PM   LOS: 3 days   Date of Service: The patient was seen and examined on 08/16/2024  Subjective:  The patient was seen examined this morning, stable no acute distress, Denies any chest pain or shortness of breath\ BP chronically low this morning 95/73, satting 92% on room air  Brief Narrative:   Ladarian Bonczek is a 58 y.o. 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    Patient has been at his usual stat of health until yesterday when he noted palpitations, consistent with his atrial fibrillation. Symptoms self resolved but then noted fever and chills, prompting him to come to the hospital.  He mentions every time is atrial fibrillation becomes symptomatic and he develops fever he usually has an infection.  His last HD was on Friday, 48 hrs ago, with no complications. He has a left upper extremity fistula.  Currently he is in the process of getting into the renal transplant list in [name].  Recently he had a toe nail removed  2 weeks ago, on his left foot, by podiatry.    Denies any maise or generalized weakness, no nausea or vomiting, no rashes, chest pain or dyspnea.    Assessment & Plan:   Principal Problem:   Fever Active Problems:   Paroxysmal atrial fibrillation (HCC)   Chronic diastolic CHF (congestive heart failure) (HCC)   S/P coronary artery stent placement   ESRD on dialysis (HCC)   Mixed hyperlipidemia   BPH (benign prostatic hyperplasia)   OSA on CPAP   GERD (gastroesophageal reflux disease)   Assessment and plan  Bacteremia, questionable  osteomyelitis Fever with presumed osteomyelitis and Bacteremia  - With concern for right great toe osteomyelitis - Blood cultures growing E. coli/Enterobacter -ESBL - Repeat blood cultures-no growth to date -Current antibiotics Zyvox , meropenem   Consulted ID Dr. Overton Appreciate further evaluation recommendations   - MRI suggesting the presence of possible osteomyelitis but unable to confirm it.  - Uric acid 5.7 (essentially ruling out component of gout). -    Paroxysmal atrial fibrillation (HCC) -Will continue rate control with amiodarone  and metoprolol   - Not on chronic anticoagulation.  History of Watchman procedure per patient    Chronic diastolic CHF (congestive heart failure) (HCC) -Stable and compensated -Fluid management with hemodialysis - Follow daily weights/strict I's and O's - Low-sodium diet discussed with patient.   S/P coronary artery stent placement -Stable denies any chest pain or shortness of breath - Continue aspirin , metoprolol  and statin    ESRD on dialysis [facility])- HD MWF -Nephrology service have been consulted - Will follow recommendations regarding continuation of dialysis treatments -On hemodialysis 08/15/2024,    Mixed hyperlipidemia -Continue statin.   BPH (benign prostatic hyperplasia) -Continue treatment with Flomax .   OSA on CPAP -Continue CPAP nightly.   GERD (gastroesophageal reflux disease) -Continue  PPI.     -------------------------------------------------------------------------------------------------------------------------------- Nutritional status:  The patient's BMI is: Body mass index is 28.07 kg/m. I agree with the assessment and plan as outlined    ---------------------------------------------------------------------------------------------------------------------------- Cultures; Blood Cultures x 2 >> coli, ESBL, Enterobacter Repeat blood cultures 08/14/2024>> no growth to  date   ------------------------------------------------------------------------------------------------------------------------------------------------  DVT prophylaxis:  SCDs Start: 08/12/24 2306   Code Status:   Code Status: Full Code  Family Communication: Discussed with his wife over the phone.  Updated. -Advance care planning has been discussed.   Admission status:   Status is: Inpatient Remains inpatient appropriate because: Needing IV antibiotics, treating bacteremia, possible to myelitis   Disposition: From  - home             Planning for discharge in 1-days -pending final antibiotic recommendation from ID  Procedures:   No admission procedures for hospital encounter.   Antimicrobials:  Anti-infectives (From admission, onward)    Start     Dose/Rate Route Frequency Ordered Stop   08/15/24 1230  meropenem (MERREM) 1 g in sodium chloride  0.9 % 100 mL IVPB        1 g 200 mL/hr over 30 Minutes Intravenous  Once 08/15/24 0948 08/15/24 1454   08/15/24 0845  meropenem (MERREM) 1 g in sodium chloride  0.9 % 100 mL IVPB  Status:  Discontinued        1 g 200 mL/hr over 30 Minutes Intravenous  Once 08/15/24 0749 08/15/24 0948   08/14/24 1815  meropenem (MERREM) 1 g in sodium chloride  0.9 % 100 mL IVPB        1 g 200 mL/hr over 30 Minutes Intravenous Every 24 hours 08/14/24 1721     08/14/24 1300  cefTRIAXone  (ROCEPHIN ) 2 g in sodium chloride  0.9 % 100 mL IVPB  Status:  Discontinued        2 g 200 mL/hr over 30 Minutes Intravenous Every 24 hours 08/14/24 1200 08/14/24 1721   08/13/24 2200  linezolid  (ZYVOX ) IVPB 600 mg        600 mg 300 mL/hr over 60 Minutes Intravenous Every 12 hours 08/13/24 1714     08/12/24 1945  Ampicillin-Sulbactam (UNASYN) 3 g in sodium chloride  0.9 % 100 mL IVPB       Placed in And Linked Group   3 g 200 mL/hr over 30 Minutes Intravenous  Once 08/12/24 1936 08/12/24 2024   08/12/24 1945  linezolid  (ZYVOX ) tablet 600 mg       Placed in And  Linked Group   600 mg Oral  Once 08/12/24 1936 08/12/24 2022        Medication:   allopurinol  100 mg Oral q AM   amiodarone   200 mg Oral QPM   ascorbic acid  500 mg Oral q AM   aspirin  EC  81 mg Oral q AM   atorvastatin   80 mg Oral QHS   calcitRIOL   1 mcg Oral Q M,W,F-HD   Chlorhexidine  Gluconate Cloth  6 each Topical Q0600   famotidine   20 mg Oral QHS   ferric citrate   210 mg Oral TID WC   metoprolol  succinate  12.5 mg Oral Daily   midodrine   15 mg Oral TID   multivitamin with minerals  1 tablet Oral q AM   pantoprazole   40 mg Oral BID   tamsulosin   0.4 mg Oral QPM    acetaminophen  **OR** acetaminophen , alteplase , alum & mag hydroxide-simeth, anticoagulant sodium citrate , cyclobenzaprine, heparin , lidocaine  (PF), lidocaine -prilocaine , loperamide , ondansetron  **OR** ondansetron  (ZOFRAN )  IV, mouth rinse, pentafluoroprop-tetrafluoroeth   Objective:   Vitals:   08/15/24 2057 08/15/24 2159 08/15/24 2300 08/16/24 0437  BP: (!) 114/92   95/73  Pulse: 95   71  Resp: 20   18  Temp: (!) 103.2 F (39.6 C) (!) 101.5 F (38.6 C) 99.8 F (37.7 C) 97.8 F (36.6 C)  TempSrc: Oral Oral Oral Oral  SpO2: 100%   92%  Weight:      Height:        Intake/Output Summary (Last 24 hours) at 08/16/2024 1202 Last data filed at 08/15/2024 1555 Gross per 24 hour  Intake 464.17 ml  Output 2000 ml  Net -1535.83 ml   Filed Weights   08/13/24 2000 08/14/24 1611 08/15/24 0857  Weight: 96.3 kg 96.6 kg 96.5 kg     Physical examination:       General:  AAO x 3,  cooperative, no distress;   HEENT:  Normocephalic, PERRL, otherwise with in Normal limits   Neuro:  CNII-XII intact. , normal motor and sensation, reflexes intact   Lungs:   Clear to auscultation BL, Respirations unlabored,  No wheezes / crackles  Cardio:    S1/S2, RRR, No murmure, No Rubs or Gallops   Abdomen:  Soft, non-tender, bowel sounds active all four quadrants, no guarding or peritoneal signs.  Muscular  skeletal:   Limited exam -global generalized weaknesses - in bed, able to move all 4 extremities,   2+ pulses,  symmetric, No pitting edema  Skin:  Dry, warm to touch, negative for any Rashes,  Wounds: Please see nursing documentation         ------------------------------------------------------------------------------------------------------------------------------------------    LABs:     Latest Ref Rng & Units 08/15/2024    8:50 AM 08/13/2024    9:06 AM 08/13/2024    4:40 AM  CBC  WBC 4.0 - 10.5 K/uL 9.5  8.2  7.8   Hemoglobin 13.0 - 17.0 g/dL 89.6  89.6  88.5   Hematocrit 39.0 - 52.0 % 33.1  32.8  36.8   Platelets 150 - 400 K/uL 152  149  148       Latest Ref Rng & Units 08/15/2024    8:50 AM 08/13/2024    4:40 AM 08/12/2024    6:14 PM  CMP  Glucose 70 - 99 mg/dL 893  82  95   BUN 6 - 20 mg/dL 49  64  60   Creatinine 0.61 - 1.24 mg/dL 87.59  88.00  88.67   Sodium 135 - 145 mmol/L 134  138  137   Potassium 3.5 - 5.1 mmol/L 3.4  4.2  3.9   Chloride 98 - 111 mmol/L 92  96  97   CO2 22 - 32 mmol/L 25  23  23    Calcium  8.9 - 10.3 mg/dL 7.6  7.3  7.2   Total Protein 6.5 - 8.1 g/dL   6.8   Total Bilirubin 0.0 - 1.2 mg/dL   1.1   Alkaline Phos 38 - 126 U/L   161   AST 15 - 41 U/L   40   ALT 0 - 44 U/L   31        Micro Results Recent Results (from the past 240 hours)  Resp panel by RT-PCR (RSV, Flu A&B, Covid) Anterior Nasal Swab     Status: None   Collection Time: 08/12/24  5:52 PM   Specimen: Anterior Nasal Swab  Result Value Ref Range Status   SARS Coronavirus 2  by RT PCR NEGATIVE NEGATIVE Final    Comment: (NOTE) SARS-CoV-2 target nucleic acids are NOT DETECTED.  The SARS-CoV-2 RNA is generally detectable in upper respiratory specimens during the acute phase of infection. The lowest concentration of SARS-CoV-2 viral copies this assay can detect is 138 copies/mL. A negative result does not preclude SARS-Cov-2 infection and should not be used as the sole basis for  treatment or other patient management decisions. A negative result may occur with  improper specimen collection/handling, submission of specimen other than nasopharyngeal swab, presence of viral mutation(s) within the areas targeted by this assay, and inadequate number of viral copies(<138 copies/mL). A negative result must be combined with clinical observations, patient history, and epidemiological information. The expected result is Negative.  Fact Sheet for Patients:  BloggerCourse.com  Fact Sheet for Healthcare Providers:  SeriousBroker.it  This test is no t yet approved or cleared by the [place]  FDA and  has been authorized for detection and/or diagnosis of SARS-CoV-2 by FDA under an Emergency Use Authorization (EUA). This EUA will remain  in effect (meaning this test can be used) for the duration of the COVID-19 declaration under Section 564(b)(1) of the Act, 21 U.S.C.section 360bbb-3(b)(1), unless the authorization is terminated  or revoked sooner.       Influenza A by PCR NEGATIVE NEGATIVE Final   Influenza B by PCR NEGATIVE NEGATIVE Final    Comment: (NOTE) The Xpert Xpress SARS-CoV-2/FLU/RSV plus assay is intended as an aid in the diagnosis of influenza from Nasopharyngeal swab specimens and should not be used as a sole basis for treatment. Nasal washings and aspirates are unacceptable for Xpert Xpress SARS-CoV-2/FLU/RSV testing.  Fact Sheet for Patients: BloggerCourse.com  Fact Sheet for Healthcare Providers: SeriousBroker.it  This test is not yet approved or cleared by the [place]  FDA and has been authorized for detection and/or diagnosis of SARS-CoV-2 by FDA under an Emergency Use Authorization (EUA). This EUA will remain in effect (meaning this test can be used) for the duration of the COVID-19 declaration under Section 564(b)(1) of the Act, 21  U.S.C. section 360bbb-3(b)(1), unless the authorization is terminated or revoked.     Resp Syncytial Virus by PCR NEGATIVE NEGATIVE Final    Comment: (NOTE) Fact Sheet for Patients: BloggerCourse.com  Fact Sheet for Healthcare Providers: SeriousBroker.it  This test is not yet approved or cleared by the [place]  FDA and has been authorized for detection and/or diagnosis of SARS-CoV-2 by FDA under an Emergency Use Authorization (EUA). This EUA will remain in effect (meaning this test can be used) for the duration of the COVID-19 declaration under Section 564(b)(1) of the Act, 21 U.S.C. section 360bbb-3(b)(1), unless the authorization is terminated or revoked.  Performed at [facility], [address], [place], [place] [address]   Blood Culture (routine x 2)     Status: Abnormal   Collection Time: 08/12/24  6:14 PM   Specimen: BLOOD RIGHT FOREARM  Result Value Ref Range Status   Specimen Description BLOOD RIGHT FOREARM  Final   Special Requests   Final    BOTTLES DRAWN AEROBIC AND ANAEROBIC Blood Culture adequate volume   Culture  Setup Time   Final    ANAEROBIC BOTTLE ONLY GRAM NEGATIVE RODS Gram Stain Report Called to,Read Back By and Verified With: J KING AT 1154 ON 09.30.25 BY ADGER J  CRITICAL RESULT CALLED TO, READ BACK BY AND VERIFIED WITH: MAYA CANDIE SOUR [id number] @ 1715 FH Performed at [facility], [address] [place], [place] [address]    Culture (A)  Final    ESCHERICHIA COLI Confirmed Extended Spectrum Beta-Lactamase Producer (ESBL).  In bloodstream infections from ESBL organisms, carbapenems are preferred over piperacillin /tazobactam. They are shown to have a lower risk of mortality.    Report Status 08/16/2024 FINAL  Final   Organism ID, Bacteria ESCHERICHIA COLI  Final      Susceptibility   Escherichia coli - MIC*    AMPICILLIN >=32 RESISTANT Resistant     CEFAZOLIN  (NON-URINE) >=32 RESISTANT  Resistant     CEFEPIME  >=32 RESISTANT Resistant     ERTAPENEM <=0.12 SENSITIVE Sensitive     CEFTRIAXONE  >=64 RESISTANT Resistant     CIPROFLOXACIN >=4 RESISTANT Resistant     GENTAMICIN <=1 SENSITIVE Sensitive     MEROPENEM <=0.25 SENSITIVE Sensitive     TRIMETH/SULFA >=320 RESISTANT Resistant     AMPICILLIN/SULBACTAM >=32 RESISTANT Resistant     PIP/TAZO Value in next row Sensitive      8 SENSITIVEThis is a modified FDA-approved test that has been validated and its performance characteristics determined by the reporting laboratory.  This laboratory is certified under the Clinical Laboratory Improvement Amendments CLIA as qualified to perform high complexity clinical laboratory testing.    * ESCHERICHIA COLI  Blood Culture ID Panel (Reflexed)     Status: Abnormal   Collection Time: 08/12/24  6:14 PM  Result Value Ref Range Status   Enterococcus faecalis NOT DETECTED NOT DETECTED Final   Enterococcus Faecium NOT DETECTED NOT DETECTED Final   Listeria monocytogenes NOT DETECTED NOT DETECTED Final   Staphylococcus species NOT DETECTED NOT DETECTED Final   Staphylococcus aureus (BCID) NOT DETECTED NOT DETECTED Final   Staphylococcus epidermidis NOT DETECTED NOT DETECTED Final   Staphylococcus lugdunensis NOT DETECTED NOT DETECTED Final   Streptococcus species NOT DETECTED NOT DETECTED Final   Streptococcus agalactiae NOT DETECTED NOT DETECTED Final   Streptococcus pneumoniae NOT DETECTED NOT DETECTED Final   Streptococcus pyogenes NOT DETECTED NOT DETECTED Final   A.calcoaceticus-baumannii NOT DETECTED NOT DETECTED Final   Bacteroides fragilis NOT DETECTED NOT DETECTED Final   Enterobacterales DETECTED (A) NOT DETECTED Final    Comment: Enterobacterales represent a large order of gram negative bacteria, not a single organism. CRITICAL RESULT CALLED TO, READ BACK BY AND VERIFIED WITH: PHARMD S. HURTH [id number] @ 1715 FH    Enterobacter cloacae complex NOT DETECTED NOT DETECTED Final    Escherichia coli DETECTED (A) NOT DETECTED Final    Comment: CRITICAL RESULT CALLED TO, READ BACK BY AND VERIFIED WITH: PHARMD S. HURTH [id number] @ 1715 FH    Klebsiella aerogenes NOT DETECTED NOT DETECTED Final   Klebsiella oxytoca NOT DETECTED NOT DETECTED Final   Klebsiella pneumoniae NOT DETECTED NOT DETECTED Final   Proteus species NOT DETECTED NOT DETECTED Final   Salmonella species NOT DETECTED NOT DETECTED Final   Serratia marcescens NOT DETECTED NOT DETECTED Final   Haemophilus influenzae NOT DETECTED NOT DETECTED Final   Neisseria meningitidis NOT DETECTED NOT DETECTED Final   Pseudomonas aeruginosa NOT DETECTED NOT DETECTED Final   Stenotrophomonas maltophilia NOT DETECTED NOT DETECTED Final   Candida albicans NOT DETECTED NOT DETECTED Final   Candida auris NOT DETECTED NOT DETECTED Final   Candida glabrata NOT DETECTED NOT DETECTED Final   Candida krusei NOT DETECTED NOT DETECTED Final   Candida parapsilosis NOT DETECTED NOT DETECTED Final   Candida tropicalis NOT DETECTED NOT DETECTED Final   Cryptococcus neoformans/gattii NOT DETECTED NOT DETECTED Final  CTX-M ESBL DETECTED (A) NOT DETECTED Final    Comment: CRITICAL RESULT CALLED TO, READ BACK BY AND VERIFIED WITH: PHARMD S. HURTH [id number] @ 1715 FH (NOTE) Extended spectrum beta-lactamase detected. Recommend a carbapenem as initial therapy.      Carbapenem resistance IMP NOT DETECTED NOT DETECTED Final   Carbapenem resistance KPC NOT DETECTED NOT DETECTED Final   Carbapenem resistance NDM NOT DETECTED NOT DETECTED Final   Carbapenem resist OXA 48 LIKE NOT DETECTED NOT DETECTED Final   Carbapenem resistance VIM NOT DETECTED NOT DETECTED Final    Comment: Performed at [facility], [address] [place], [place] [address]  Blood Culture (routine x 2)     Status: None (Preliminary result)   Collection Time: 08/12/24  6:24 PM   Specimen: BLOOD RIGHT ARM  Result Value Ref Range Status   Specimen Description BLOOD  RIGHT ARM  Final   Special Requests NONE  Final   Culture   Final    NO GROWTH 4 DAYS Performed at [facility], [address] [place], [place] [address]    Report Status PENDING  Incomplete  MRSA Next Gen by PCR, Nasal     Status: None   Collection Time: 08/12/24 10:17 PM   Specimen: Nasal Mucosa; Nasal Swab  Result Value Ref Range Status   MRSA by PCR Next Gen NOT DETECTED NOT DETECTED Final    Comment: (NOTE) The GeneXpert MRSA Assay (FDA approved for NASAL specimens only), is one component of a comprehensive MRSA colonization surveillance program. It is not intended to diagnose MRSA infection nor to guide or monitor treatment for MRSA infections. Test performance is not FDA approved in patients less than 64 years old. Performed at [facility], [address] [place], [place] [address]     Radiology Reports No results found.  SIGNED: Adriana DELENA Grams, MD, FHM. FAAFP. Jolynn Pack - [facility] Time spent - 55 min.  In seeing, evaluating and examining the patient. Reviewing medical records, labs, drawn plan of care. [facility],  Pager (please use [email or web address] to page/ text) Please use Epic Secure Chat for non-urgent communication (7AM-7PM)  If 7PM-7AM, please contact night-coverage [email or web address], 08/16/2024, 12:02 PM

## 2024-08-16 NOTE — Progress Notes (Signed)
 PHARMACY CONSULT NOTE FOR:  OUTPATIENT  PARENTERAL ANTIBIOTIC THERAPY (OPAT)  Indication: Osteomyelitis Regimen: Invanz 500 mg q24 End date: 08/25/2024  IV antibiotic discharge orders are pended. To discharging provider:  please sign these orders via discharge navigator,  Select New Orders & click on the button choice - Manage This Unsigned Work.     Thank you for allowing pharmacy to be a part of this patient's care.  R. Samual Satterfield, PharmD PGY-1 Acute Care Pharmacy Resident [facility] Please refer to [facility] for [facility] Pharmacy numbers 08/16/2024 3:15 PM

## 2024-08-16 NOTE — Plan of Care (Signed)

## 2024-08-16 NOTE — Progress Notes (Signed)
   08/16/24 2215  BiPAP/CPAP/SIPAP  BiPAP/CPAP/SIPAP Pt Type Adult  BiPAP/CPAP/SIPAP Resmed  Mask Type Full face mask  Dentures removed? Not applicable  Mask Size Medium  FiO2 (%) 21 %  Patient Home Machine Yes  Safety Check Completed by RT for Home Unit Yes, no issues noted  Patient Home Mask Yes  Patient Home Tubing Yes  Auto Titrate Yes  Minimum cmH2O 11 cmH2O  Maximum cmH2O 15 cmH2O

## 2024-08-16 NOTE — Plan of Care (Signed)
  Problem: Clinical Measurements: Goal: Ability to maintain clinical measurements within normal limits will improve Outcome: Progressing Goal: Will remain free from infection Outcome: Progressing Goal: Diagnostic test results will improve Outcome: Progressing Goal: Respiratory complications will improve Outcome: Progressing Goal: Cardiovascular complication will be avoided Outcome: Progressing   Problem: Nutrition: Goal: Adequate nutrition will be maintained Outcome: Progressing   Problem: Coping: Goal: Level of anxiety will decrease Outcome: Progressing   Problem: Pain Managment: Goal: General experience of comfort will improve and/or be controlled Outcome: Progressing   Problem: Safety: Goal: Ability to remain free from injury will improve Outcome: Progressing   Problem: Skin Integrity: Goal: Risk for impaired skin integrity will decrease Outcome: Progressing

## 2024-08-16 NOTE — Progress Notes (Signed)
 [facility] Progress Note   Outpatient HD orders: [facility] MWF.  3 hours 45 minutes.  EDW 95 kg.  aVF 15-gauge.  17H dialyzer.  Flow rates: 400/800.  Temp 35.5 C.  2K/3 calcium  bath.  Heparin : None.  Meds: Calcitriol  1 mcg every treatment, Venofer  50 mg once weekly, Mircera 50 mcg every 2 weeks (last dose 9/22)  Assessment/ Plan:   ESRD   -Appreciate nursing staff getting a floor scale on Wed for more accurate weight (96.6kg); tolerated dialysis on Wednesday with 2 L net UF.  Next treatment for Friday on MWF regimen.     Fever -workup/mgmt per primary service -MRI right foot with possible OM vs post op changed from toenail removal.  Blood cultures positive for E. coli and Enterobacter -currently on Merrem and Zyvox    Volume/ hypotension  -UF as tolerated (his dry weight is 95 kg) -continue with midodrine , can repeat dose mid-run on HD -ACTH stim test pending   Anemia of Chronic Kidney Disease Hemoglobin 10.3. Holding iron  for now given infection/need for abx. Resume ESA once Hgb <10 -Transfuse PRN for Hgb <7   Secondary Hyperparathyroidism/Hyperphosphatemia - resume home meds, monitor phos. Renal diet recommended . Resuming calcitriol .  A lot of junk food, processed foods bedside.  Patient blames it on his wife who is packing the snacks for him.    Subjective:   Patient seen and examined bedside.  Denies any shortness of breath, cough, fevers; did not have any cramps or dizziness with dialysis yesterday.   Objective:   BP 95/73 (BP Location: Right Arm)   Pulse 71   Temp 97.8 F (36.6 C) (Oral)   Resp 18   Ht 6' 1 (1.854 m)   Wt 96.5 kg   SpO2 92%   BMI 28.07 kg/m   Intake/Output Summary (Last 24 hours) at 08/16/2024 0933 Last data filed at 08/15/2024 1555 Gross per 24 hour  Intake 464.17 ml  Output 2000 ml  Net -1535.83 ml   Weight change: -0.116 kg  Physical Exam: Gen: NAD, sitting up in bed CVS: irreg irreg Resp:normal wob, unlabored,  speaking in full sentences Abd: soft nt/nd Ext: no edema Neuro: awake, alert Dialysis access: LUE Cimino  +b/t  Imaging: No results found.   Labs: BMET Recent Labs  Lab 08/12/24 1814 08/13/24 0440 08/15/24 0850  NA 137 138 134*  K 3.9 4.2 3.4*  CL 97* 96* 92*  CO2 23 23 25   GLUCOSE 95 82 106*  BUN 60* 64* 49*  CREATININE 11.32* 11.99* 12.40*  CALCIUM  7.2* 7.3* 7.6*  PHOS  --   --  2.4*   CBC Recent Labs  Lab 08/12/24 1814 08/13/24 0440 08/13/24 0906 08/15/24 0850  WBC 7.9 7.8 8.2 9.5  NEUTROABS 6.6  --   --   --   HGB 10.0* 11.4* 10.3* 10.3*  HCT 32.2* 36.8* 32.8* 33.1*  MCV 96.7 97.4 95.3 95.9  PLT 153 148* 149* 152    Medications:     allopurinol  100 mg Oral q AM   amiodarone   200 mg Oral QPM   ascorbic acid  500 mg Oral q AM   aspirin  EC  81 mg Oral q AM   atorvastatin   80 mg Oral QHS   calcitRIOL   1 mcg Oral Q M,W,F-HD   Chlorhexidine  Gluconate Cloth  6 each Topical Q0600   famotidine   20 mg Oral QHS   ferric citrate   210 mg Oral TID WC   metoprolol  succinate  12.5 mg Oral Daily   midodrine   15 mg Oral TID   multivitamin with minerals  1 tablet Oral q AM   pantoprazole   40 mg Oral BID   tamsulosin   0.4 mg Oral QPM
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
## 2024-08-17 DIAGNOSIS — A4151 Sepsis due to Escherichia coli [E. coli]: Secondary | ICD-10-CM | POA: Diagnosis not present

## 2024-08-17 LAB — BASIC METABOLIC PANEL WITH GFR
Anion gap: 15 (ref 5–15)
BUN: 39 mg/dL — ABNORMAL HIGH (ref 6–20)
CO2: 26 mmol/L (ref 22–32)
Calcium: 7.6 mg/dL — ABNORMAL LOW (ref 8.9–10.3)
Chloride: 93 mmol/L — ABNORMAL LOW (ref 98–111)
Creatinine, Ser: 11.3 mg/dL — ABNORMAL HIGH (ref 0.61–1.24)
GFR, Estimated: 5 mL/min — ABNORMAL LOW (ref >60)
Glucose, Bld: 79 mg/dL (ref 70–99)
Potassium: 3.4 mmol/L — ABNORMAL LOW (ref 3.5–5.1)
Sodium: 134 mmol/L — ABNORMAL LOW (ref 135–145)

## 2024-08-17 LAB — CULTURE, BLOOD (ROUTINE X 2): Culture: NO GROWTH

## 2024-08-17 LAB — CBC
HCT: 29.4 % — ABNORMAL LOW (ref 39.0–52.0)
Hemoglobin: 9 g/dL — ABNORMAL LOW (ref 13.0–17.0)
MCH: 29.4 pg (ref 26.0–34.0)
MCHC: 30.6 g/dL (ref 30.0–36.0)
MCV: 96.1 fL (ref 80.0–100.0)
Platelets: 165 K/uL (ref 150–400)
RBC: 3.06 MIL/uL — ABNORMAL LOW (ref 4.22–5.81)
RDW: 16.4 % — ABNORMAL HIGH (ref 11.5–15.5)
WBC: 12.1 K/uL — ABNORMAL HIGH (ref 4.0–10.5)
nRBC: 0 % (ref 0.0–0.2)

## 2024-08-17 MED ORDER — CALCITRIOL 0.5 MCG PO CAPS: 1.0000 ug | ORAL_CAPSULE | ORAL | 0 refills | Status: AC

## 2024-08-17 MED ORDER — AMIODARONE HCL 200 MG PO TABS: 200.0000 mg | ORAL_TABLET | Freq: Every day | ORAL | 5 refills | Status: AC

## 2024-08-17 MED ORDER — SODIUM CHLORIDE 0.9% FLUSH: 10.0000 mL | Freq: Two times a day (BID) | INTRAVENOUS | Status: DC

## 2024-08-17 MED ORDER — MIDODRINE HCL 5 MG PO TABS
ORAL_TABLET | ORAL | Status: AC
  Filled 2024-08-17: qty 3

## 2024-08-17 MED ORDER — METOPROLOL SUCCINATE ER 25 MG PO TB24: 12.5000 mg | ORAL_TABLET | Freq: Every day | ORAL | 2 refills | Status: DC

## 2024-08-17 MED ORDER — SODIUM CHLORIDE 0.9% FLUSH: 10.0000 mL | INTRAVENOUS | Status: DC | PRN

## 2024-08-17 MED ORDER — ACETAMINOPHEN 325 MG PO TABS
ORAL_TABLET | ORAL | Status: AC
  Filled 2024-08-17: qty 2

## 2024-08-17 MED ORDER — SODIUM CHLORIDE 0.9% FLUSH
10.0000 mL | Freq: Two times a day (BID) | INTRAVENOUS | Status: DC
  Administered 2024-08-17: 10 mL

## 2024-08-17 MED ORDER — CALCITRIOL 0.25 MCG PO CAPS
ORAL_CAPSULE | ORAL | Status: AC
  Filled 2024-08-17: qty 4

## 2024-08-17 NOTE — Progress Notes (Signed)
 The patient had completed dialysis and goal met. Meds given during HD: Midodrine  15 mg po. Calcitriol  1 mcg po.  08/17/24 1325  Vitals  Temp 98.4 F (36.9 C)  Temp Source Oral  BP 90/62  BP Location Right Arm  BP Method Automatic  Patient Position (if appropriate) Lying  Pulse Rate (!) 57  Pulse Rate Source Monitor  Resp 18  Oxygen Therapy  SpO2 100 %  O2 Device Room Air  During Treatment Monitoring  Intra-Hemodialysis Comments Tx completed  Post Treatment  Dialyzer Clearance Lightly streaked  Hemodialysis Intake (mL) 0 mL  Liters Processed 82  Fluid Removed (mL) 2000 mL  Tolerated HD Treatment Yes  Post-Hemodialysis Comments see notes.  AVG/AVF Arterial Site Held (minutes) 7 minutes  AVG/AVF Venous Site Held (minutes) 7 minutes

## 2024-08-17 NOTE — Care Management Important Message (Signed)
 Important Message  Patient Details  Name: [name] MRN: [id number] Date of Birth: 1966/11/05   Important Message Given:  Yes - Medicare IM (copy left on bedside table)     Duwaine LITTIE Ada 08/17/2024, 12:42 PM

## 2024-08-17 NOTE — Progress Notes (Signed)
 [facility] Progress Note   Outpatient HD orders: [facility] MWF.  3 hours 45 minutes.  EDW 95 kg.  aVF 15-gauge.  17H dialyzer.  Flow rates: 400/800.  Temp 35.5 C.  2K/3 calcium  bath.  Heparin : None.  Meds: Calcitriol  1 mcg every treatment, Venofer  50 mg once weekly, Mircera 50 mcg every 2 weeks (last dose 9/22)  Assessment/ Plan:   ESRD   -Appreciate nursing staff getting a floor scale on Wed for more accurate weight (96.6kg); tolerated dialysis on Wednesday with 2 L net UF.  Next treatment today on MWF regimen.  I discussed with home dialysis center and they are not able to get the ertapenem until a week after ordering.  Not ideal but okay to place right midline for ertapenem to receive an infusion center.  - On dialysis today, orders already written; he may receive his next dialysis treatment as outpatient center Monday Wednesday Fridays.  He is first shift so would like to schedule the ertapenem to be infused late morning or early afternoon.  He is usually off the machine by 10:30 AM on MWF.   Fever -workup/mgmt per primary service -MRI right foot with possible OM vs post op changed from toenail removal.  Blood cultures positive for E. coli and Enterobacter -Merrem and Zyvox  -> Ertapenem (2 weeks 9/30-10/14)   Volume/ hypotension  -UF as tolerated (his dry weight is 95 kg) -continue with midodrine , can repeat dose mid-run on HD -ACTH stim test pending   Anemia of Chronic Kidney Disease Hemoglobin 10.3. Holding iron  for now given infection/need for abx. Resume ESA once Hgb <10 -Transfuse PRN for Hgb <7   Secondary Hyperparathyroidism/Hyperphosphatemia - resume home meds, monitor phos. Renal diet recommended . Resuming calcitriol .  A lot of junk food, processed foods bedside.  Patient blames it on his wife who is packing the snacks for him.    Subjective:   Patient seen and examined bedside.  Denies any shortness of breath, cough, fevers; did not have any  cramps or dizziness with dialysis on Wed.  Good appetite   Objective:   BP (!) 92/43 (BP Location: Right Arm)   Pulse (!) 56   Temp 99.3 F (37.4 C) (Oral)   Resp 18   Ht 6' 1 (1.854 m)   Wt 96.5 kg   SpO2 100%   BMI 28.07 kg/m   Intake/Output Summary (Last 24 hours) at 08/17/2024 0911 Last data filed at 08/17/2024 0300 Gross per 24 hour  Intake 48.35 ml  Output --  Net 48.35 ml   Weight change:   Physical Exam: Gen: NAD, sitting up in bed CVS: irreg irreg Resp:normal wob, unlabored, speaking in full sentences Abd: soft nt/nd Ext: no edema, rt GT bandaged Neuro: awake, alert Dialysis access: LUE Cimino  +b/t  Imaging: US  EKG SITE RITE Result Date: 08/16/2024 If Site Rite image not attached, placement could not be confirmed due to current cardiac rhythm.    Labs: BMET Recent Labs  Lab 08/12/24 1814 08/13/24 0440 08/15/24 0850 08/17/24 0417  NA 137 138 134* 134*  K 3.9 4.2 3.4* 3.4*  CL 97* 96* 92* 93*  CO2 23 23 25 26   GLUCOSE 95 82 106* 79  BUN 60* 64* 49* 39*  CREATININE 11.32* 11.99* 12.40* 11.30*  CALCIUM  7.2* 7.3* 7.6* 7.6*  PHOS  --   --  2.4*  --    CBC Recent Labs  Lab 08/12/24 1814 08/13/24 0440 08/13/24 0906 08/15/24 0850 08/17/24 0417  WBC 7.9 7.8 8.2 9.5 12.1*  NEUTROABS 6.6  --   --   --   --   HGB 10.0* 11.4* 10.3* 10.3* 9.0*  HCT 32.2* 36.8* 32.8* 33.1* 29.4*  MCV 96.7 97.4 95.3 95.9 96.1  PLT 153 148* 149* 152 165    Medications:     allopurinol  100 mg Oral q AM   amiodarone   200 mg Oral QPM   ascorbic acid  500 mg Oral q AM   aspirin  EC  81 mg Oral q AM   atorvastatin   80 mg Oral QHS   calcitRIOL   1 mcg Oral Q M,W,F-HD   Chlorhexidine  Gluconate Cloth  6 each Topical Q0600   famotidine   20 mg Oral QHS   ferric citrate   210 mg Oral TID WC   metoprolol  succinate  12.5 mg Oral Daily   midodrine   15 mg Oral TID   multivitamin with minerals  1 tablet Oral q AM   pantoprazole   40 mg Oral BID   sodium chloride  flush  10-40  mL Intracatheter Q12H   tamsulosin   0.4 mg Oral QPM

## 2024-08-17 NOTE — Plan of Care (Addendum)
 Alert and oriented x 4. Up adlib. Refused CHG bath this am. Educated. Pt received 1 dose of imodium  and 1 dose of maalox this shift.  Problem: Education: Goal: Knowledge of General Education information will improve Description: Including pain rating scale, medication(s)/side effects and non-pharmacologic comfort measures Outcome: Progressing   Problem: Health Behavior/Discharge Planning: Goal: Ability to manage health-related needs will improve Outcome: Progressing   Problem: Clinical Measurements: Goal: Ability to maintain clinical measurements within normal limits will improve Outcome: Progressing Goal: Will remain free from infection Outcome: Progressing Goal: Diagnostic test results will improve Outcome: Progressing Goal: Respiratory complications will improve Outcome: Progressing Goal: Cardiovascular complication will be avoided Outcome: Progressing   Problem: Activity: Goal: Risk for activity intolerance will decrease Outcome: Progressing   Problem: Nutrition: Goal: Adequate nutrition will be maintained Outcome: Progressing   Problem: Coping: Goal: Level of anxiety will decrease Outcome: Progressing   Problem: Elimination: Goal: Will not experience complications related to bowel motility Outcome: Progressing Goal: Will not experience complications related to urinary retention Outcome: Progressing   Problem: Pain Managment: Goal: General experience of comfort will improve and/or be controlled Outcome: Progressing   Problem: Safety: Goal: Ability to remain free from injury will improve Outcome: Progressing   Problem: Skin Integrity: Goal: Risk for impaired skin integrity will decrease Outcome: Progressing

## 2024-08-17 NOTE — Discharge Summary (Signed)
 Physician Discharge Summary   Patient: [name] MRN: [id number] DOB: Jun 14, 1966  Admit date:     08/12/2024  Discharge date: 08/17/24  Discharge Physician: Adriana DELENA Grams   PCP: Maree Virgilio SAUNDERS, MD   Recommendations at discharge:    Follow-up with your nephrologist, and hemodialysis as scheduled (Monday- Wednesdays and Fridays) Continue IV antibiotic infusion till designated date. Follow-up with infectious disease Dr. Overton in 1-2 weeks Right arm midline may be removed on the day of ended IV antibiotics Please follow-up with the final blood cultures with infectious disease team/PCP and your nephrologist  Discharge Diagnoses: Principal Problem:   Fever Active Problems:   Paroxysmal atrial fibrillation (HCC)   Chronic diastolic CHF (congestive heart failure) (HCC)   S/P coronary artery stent placement   ESRD on dialysis (HCC)   Mixed hyperlipidemia   BPH (benign prostatic hyperplasia)   OSA on CPAP   GERD (gastroesophageal reflux disease)    Hospital Course: [name] is a 58 y.o. 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    Patient has been at his usual stat of health until yesterday when he noted palpitations, consistent with his atrial fibrillation. Symptoms self resolved but then noted fever and chills, prompting him to come to the hospital.  He mentions every time is atrial fibrillation becomes symptomatic and he develops fever he usually has an infection.  His last HD was on Friday, 48 hrs ago, with no complications. He has a left upper extremity fistula.  Currently he is in the process of getting into the renal transplant list in [name].  Recently he had a toe nail removed  2 weeks ago, on his left foot, by podiatry.    Denies any maise or  generalized weakness, no nausea or vomiting, no rashes, chest pain or dyspnea.      Bacteremia, questionable osteomyelitis Fever with presumed osteomyelitis and Bacteremia  - With concern for right great toe osteomyelitis - Blood cultures growing E. coli/Enterobacter -ESBL - Repeat blood cultures-no growth to date -Current antibiotics Zyvox , meropenem   Consulted ID Dr. Overton Recommended to finish Ertapenem IV antibiotics total of 2 weeks 08/14/24 - to -08/28/24   Right arm midline was placed on 08/17/2024, may be removed on 08/28/2024    - MRI suggesting the presence of possible osteomyelitis but unable to confirm it.  - Uric acid 5.7 (essentially ruling out component of gout).    Paroxysmal atrial fibrillation (HCC) -Will continue rate control with amiodarone  and metoprolol   - Not on chronic anticoagulation.  History of Watchman procedure per patient    Chronic diastolic CHF (congestive heart failure) (HCC) -Stable and compensated -Fluid management with hemodialysis - Low-sodium diet discussed with patient.   S/P coronary artery stent placement -Stable denies any chest pain or shortness of breath - Continue aspirin , metoprolol  and statin    ESRD on dialysis [facility])- HD MWF -Nephrology service have been consulted - Will follow recommendations regarding continuation of dialysis treatments -On hemodialysis 08/15/2024,    Mixed hyperlipidemia -Continue statin.   BPH (benign prostatic hyperplasia) -Continue treatment with Flomax .   OSA on CPAP -Continue CPAP nightly.   GERD (gastroesophageal reflux disease) -Continue PPI.     Consultants: Infectious disease team/nephrology Procedures performed: Hemodialysis, right arm midline placement Disposition: Home Diet recommendation:  Discharge Diet Orders (From admission, onward)  Start     Ordered   08/17/24 0000  Diet - low sodium heart healthy        08/17/24 1302           Renal diet DISCHARGE  MEDICATION: Allergies as of 08/17/2024       Reactions   Vancomycin Hives, Itching, Swelling, Rash, Other (See Comments)   Reaction Type: Allergy; Reaction(s): swelling of lips        Medication List     STOP taking these medications    calcium  carbonate 750 MG chewable tablet Commonly known as: TUMS EX   doxycycline  100 MG capsule Commonly known as: VIBRAMYCIN        TAKE these medications    acetaminophen  500 MG tablet Commonly known as: TYLENOL  Take 1,000 mg by mouth every 6 (six) hours as needed for moderate pain.   allopurinol 100 MG tablet Commonly known as: ZYLOPRIM Take 100 mg by mouth in the morning.   amiodarone  200 MG tablet Commonly known as: PACERONE  Take 1 tablet (200 mg total) by mouth daily.   aspirin  EC 81 MG tablet Take 81 mg by mouth in the morning.   atorvastatin  80 MG tablet Commonly known as: LIPITOR  Take 80 mg by mouth at bedtime.   Auryxia  1 GM 210 MG(Fe) tablet Generic drug: ferric citrate  Take 420 mg by mouth 3 (three) times daily with meals. Takes 1 with snacks.   calcitRIOL  0.5 MCG capsule Commonly known as: ROCALTROL  Take 2 capsules (1 mcg total) by mouth every Monday, Wednesday, and Friday with hemodialysis. Start taking on: August 20, 2024   cyclobenzaprine 10 MG tablet Commonly known as: FLEXERIL Take 10 mg by mouth daily as needed for muscle spasms.   ertapenem IVPB Commonly known as: INVANZ Inject 500 mg into the vein daily for 10 days. Indication:  Osteomyelitis First Dose: Yes Last Day of Therapy:  08/25/2024 Labs - Once weekly:  CBC/D and BMP, Labs - Once weekly: ESR and CRP Method of administration: Mini-Bag Plus / Gravity Method of administration may be changed at the discretion of home infusion pharmacist based upon assessment of the patient and/or caregiver's ability to self-administer the medication ordered.   famotidine  20 MG tablet Commonly known as: PEPCID  Take 20 mg by mouth at bedtime.   metoprolol   succinate 25 MG 24 hr tablet Commonly known as: TOPROL -XL Take 0.5 tablets (12.5 mg total) by mouth daily. Start taking on: August 18, 2024 What changed:  how much to take additional instructions   midodrine  5 MG tablet Commonly known as: PROAMATINE  Take 3 tablets (15 mg total) by mouth 3 (three) times daily with meals. What changed: Another medication with the same name was removed. Continue taking this medication, and follow the directions you see here.   multivitamin with minerals Tabs tablet Take 1 tablet by mouth in the morning.   pantoprazole  40 MG tablet Commonly known as: PROTONIX  Take 1 tablet (40 mg total) by mouth 2 (two) times daily. What changed: when to take this   silver sulfADIAZINE 1 % cream Commonly known as: SILVADENE Apply 1 Application topically daily.   tamsulosin  0.4 MG Caps capsule Commonly known as: FLOMAX  Take 0.4 mg by mouth every evening.   VITAMIN C PO Take 500 mg by mouth in the morning.               Discharge Care Instructions  (From admission, onward)           Start     Ordered  08/17/24 0000  Discharge wound care:       Comments: Per RN wound care instructions   08/17/24 1302   08/16/24 0000  Change dressing on IV access line weekly and PRN  (Home infusion instructions - [facility] )        08/16/24 1514            Discharge Exam: Filed Weights   08/14/24 1611 08/15/24 0857 08/17/24 0947  Weight: 96.6 kg 96.5 kg 96.5 kg        General:  AAO x 3,  cooperative, no distress;   HEENT:  Normocephalic, PERRL, otherwise with in Normal limits   Neuro:  CNII-XII intact. , normal motor and sensation, reflexes intact   Lungs:   Clear to auscultation BL, Respirations unlabored,  No wheezes / crackles  Cardio:    S1/S2, RRR, No murmure, No Rubs or Gallops   Abdomen:  Soft, non-tender, bowel sounds active all four quadrants, no guarding or peritoneal signs.  Muscular  skeletal:  Limited exam -global  generalized weaknesses - in bed, able to move all 4 extremities,   2+ pulses,  symmetric, No pitting edema  Skin:  Dry, warm to touch, negative for any Rashes,  Wounds: Please see nursing documentation         Condition at discharge: good  The results of significant diagnostics from this hospitalization (including imaging, microbiology, ancillary and laboratory) are listed below for reference.   Imaging Studies: US  EKG SITE RITE Result Date: 08/16/2024 If Site Rite image not attached, placement could not be confirmed due to current cardiac rhythm.  MR FOOT RIGHT WO CONTRAST Result Date: 08/13/2024 CLINICAL DATA:  Febrile. Recent history of right great toenail removal approximately 2 weeks ago. EXAM: MRI OF THE RIGHT FOREFOOT WITHOUT CONTRAST TECHNIQUE: Multiplanar, multisequence MR imaging of the right foot was performed. No intravenous contrast was administered. COMPARISON:  Right foot radiographs dated 08/12/2024 FINDINGS: Bones/Joint/Cartilage Postsurgical changes at the level of the right great toe nail bed with irregularity and increased T2/STIR hyperintense signal. There is cortical irregularity with T2/STIR hyperintense signal and T1 hypointensity of the underlying distal phalanx of the great toe, which could reflect postsurgical change versus osteomyelitis. Nondisplaced fracture of the base of the fifth proximal phalanx with associated marrow edema, possibly subacute versus acute. No evidence of intra-articular extension. The remainder of the visualized bones demonstrate normal marrow signal intensity. No joint effusion. Mild degenerative changes of the midfoot with patchy periarticular edema at the third and fourth TMT joints. Ligaments Collateral ligaments are intact.  Lisfranc ligament is intact. Muscles and Tendons Flexor, peroneal and extensor compartment tendons are intact. Increased T2 signal of the intrinsic foot musculature is nonspecific and may reflect chronic denervation  changes. Soft tissue Postsurgical changes at the level of the right great toenail bed, as described above. Subcutaneous edema extends along the dorsal foot. No loculated fluid collection. IMPRESSION: 1. Postoperative changes at the level of the right great toenail bed with irregularity and signal abnormality of the underlying distal phalanx of the great toe, which could reflect postsurgical change versus osteomyelitis. No loculated fluid collection. 2. Nondisplaced fracture of the base of the fifth proximal phalanx with associated marrow edema, possibly subacute versus acute. No evidence of intra-articular extension. 3. Mild degenerative arthropathy of the midfoot. Electronically Signed   By: Harrietta Sherry M.D.   On: 08/13/2024 11:05   DG Chest Port 1 View Result Date: 08/12/2024 EXAM: 1 VIEW(S) XRAY OF THE CHEST 08/12/2024 06:02:00  PM COMPARISON: Prior study 01/16/2023. CLINICAL HISTORY: Questionable sepsis - evaluate for abnormality. Per chart: Pt states he had some Afib last night but began to have chills ever since. Pt noted some blood at tip of his penis. Pt is a dialysis pt. Pt states his lost a toe nail 2 weeks ago. FINDINGS: LUNGS AND PLEURA: Linear scarring in the right mid to lower lung. Right-sided pleural effusion or thickening. Diffuse pulmonary vascular congestion. No focal pulmonary opacity. No pulmonary edema. No pneumothorax. HEART AND MEDIASTINUM: Prominent cardiac silhouette. BONES AND SOFT TISSUES: Metallic foreign bodies overlying the right chest. No acute osseous abnormality. IMPRESSION: 1. Right-sided pleural effusion or thickening. 2. Prominent cardiac silhouette. 3. Diffuse pulmonary vascular congestion. 4. Linear scarring in the right mid to lower lung. Electronically signed by: Fonda Field MD 08/12/2024 06:20 PM EDT RP Workstation: [id number]   DG Foot Complete Right Result Date: 08/12/2024 EXAM: 3 or more VIEW(S) XRAY OF THE RIGHT FOOT 08/12/2024 06:02:00 PM COMPARISON: None  available. CLINICAL HISTORY: [id number] Pain [id number]. Per chart: Pt states he had some Afib last night but began to have chills ever since. Pt noted some blood at tip of his penis. Pt is a dialysis pt. Pt states his lost a toe nail 2 weeks ago. FINDINGS: BONES AND JOINTS: Distal 1st phalanx distal cortex irregularity suggesting possible osteomyelitis. This could be confirmed with MRI. No joint dislocation. SOFT TISSUES: Soft tissue air consistent with a defect overlying the great toe. IMPRESSION: 1. Distal 1st phalanx distal cortex irregularity suggesting possible osteomyelitis, potentially related to the reported lost toenail. MRI recommended if indicated to evaluate for osteomyelitis. Electronically signed by: Fonda Field MD 08/12/2024 06:18 PM EDT RP Workstation: [id number]    Microbiology: Results for orders placed or performed during the hospital encounter of 08/12/24  Resp panel by RT-PCR (RSV, Flu A&B, Covid) Anterior Nasal Swab     Status: None   Collection Time: 08/12/24  5:52 PM   Specimen: Anterior Nasal Swab  Result Value Ref Range Status   SARS Coronavirus 2 by RT PCR NEGATIVE NEGATIVE Final    Comment: (NOTE) SARS-CoV-2 target nucleic acids are NOT DETECTED.  The SARS-CoV-2 RNA is generally detectable in upper respiratory specimens during the acute phase of infection. The lowest concentration of SARS-CoV-2 viral copies this assay can detect is 138 copies/mL. A negative result does not preclude SARS-Cov-2 infection and should not be used as the sole basis for treatment or other patient management decisions. A negative result may occur with  improper specimen collection/handling, submission of specimen other than nasopharyngeal swab, presence of viral mutation(s) within the areas targeted by this assay, and inadequate number of viral copies(<138 copies/mL). A negative result must be combined with clinical observations, patient history, and epidemiological information. The expected  result is Negative.  Fact Sheet for Patients:  BloggerCourse.com  Fact Sheet for Healthcare Providers:  SeriousBroker.it  This test is no t yet approved or cleared by the [place]  FDA and  has been authorized for detection and/or diagnosis of SARS-CoV-2 by FDA under an Emergency Use Authorization (EUA). This EUA will remain  in effect (meaning this test can be used) for the duration of the COVID-19 declaration under Section 564(b)(1) of the Act, 21 U.S.C.section 360bbb-3(b)(1), unless the authorization is terminated  or revoked sooner.       Influenza A by PCR NEGATIVE NEGATIVE Final   Influenza B by PCR NEGATIVE NEGATIVE Final    Comment: (NOTE) The Xpert Xpress SARS-CoV-2/FLU/RSV plus assay is intended  as an aid in the diagnosis of influenza from Nasopharyngeal swab specimens and should not be used as a sole basis for treatment. Nasal washings and aspirates are unacceptable for Xpert Xpress SARS-CoV-2/FLU/RSV testing.  Fact Sheet for Patients: BloggerCourse.com  Fact Sheet for Healthcare Providers: SeriousBroker.it  This test is not yet approved or cleared by the [place]  FDA and has been authorized for detection and/or diagnosis of SARS-CoV-2 by FDA under an Emergency Use Authorization (EUA). This EUA will remain in effect (meaning this test can be used) for the duration of the COVID-19 declaration under Section 564(b)(1) of the Act, 21 U.S.C. section 360bbb-3(b)(1), unless the authorization is terminated or revoked.     Resp Syncytial Virus by PCR NEGATIVE NEGATIVE Final    Comment: (NOTE) Fact Sheet for Patients: BloggerCourse.com  Fact Sheet for Healthcare Providers: SeriousBroker.it  This test is not yet approved or cleared by the [place]  FDA and has been authorized for detection and/or diagnosis of  SARS-CoV-2 by FDA under an Emergency Use Authorization (EUA). This EUA will remain in effect (meaning this test can be used) for the duration of the COVID-19 declaration under Section 564(b)(1) of the Act, 21 U.S.C. section 360bbb-3(b)(1), unless the authorization is terminated or revoked.  Performed at [facility], [address], [place], [place] [address]   Blood Culture (routine x 2)     Status: Abnormal   Collection Time: 08/12/24  6:14 PM   Specimen: BLOOD RIGHT FOREARM  Result Value Ref Range Status   Specimen Description BLOOD RIGHT FOREARM  Final   Special Requests   Final    BOTTLES DRAWN AEROBIC AND ANAEROBIC Blood Culture adequate volume   Culture  Setup Time   Final    ANAEROBIC BOTTLE ONLY GRAM NEGATIVE RODS Gram Stain Report Called to,Read Back By and Verified With: J KING AT 1154 ON 09.30.25 BY ADGER J  CRITICAL RESULT CALLED TO, READ BACK BY AND VERIFIED WITH: MAYA CANDIE SOUR [id number] @ 1715 FH Performed at [facility], [address] [place], [place] [address]    Culture (A)  Final    ESCHERICHIA COLI Confirmed Extended Spectrum Beta-Lactamase Producer (ESBL).  In bloodstream infections from ESBL organisms, carbapenems are preferred over piperacillin /tazobactam. They are shown to have a lower risk of mortality.    Report Status 08/16/2024 FINAL  Final   Organism ID, Bacteria ESCHERICHIA COLI  Final      Susceptibility   Escherichia coli - MIC*    AMPICILLIN >=32 RESISTANT Resistant     CEFAZOLIN  (NON-URINE) >=32 RESISTANT Resistant     CEFEPIME  >=32 RESISTANT Resistant     ERTAPENEM <=0.12 SENSITIVE Sensitive     CEFTRIAXONE  >=64 RESISTANT Resistant     CIPROFLOXACIN >=4 RESISTANT Resistant     GENTAMICIN <=1 SENSITIVE Sensitive     MEROPENEM <=0.25 SENSITIVE Sensitive     TRIMETH/SULFA >=320 RESISTANT Resistant     AMPICILLIN/SULBACTAM >=32 RESISTANT Resistant     PIP/TAZO Value in next row Sensitive      8 SENSITIVEThis is a modified FDA-approved  test that has been validated and its performance characteristics determined by the reporting laboratory.  This laboratory is certified under the Clinical Laboratory Improvement Amendments CLIA as qualified to perform high complexity clinical laboratory testing.    * ESCHERICHIA COLI  Blood Culture ID Panel (Reflexed)     Status: Abnormal   Collection Time: 08/12/24  6:14 PM  Result Value Ref Range Status   Enterococcus faecalis NOT DETECTED NOT  DETECTED Final   Enterococcus Faecium NOT DETECTED NOT DETECTED Final   Listeria monocytogenes NOT DETECTED NOT DETECTED Final   Staphylococcus species NOT DETECTED NOT DETECTED Final   Staphylococcus aureus (BCID) NOT DETECTED NOT DETECTED Final   Staphylococcus epidermidis NOT DETECTED NOT DETECTED Final   Staphylococcus lugdunensis NOT DETECTED NOT DETECTED Final   Streptococcus species NOT DETECTED NOT DETECTED Final   Streptococcus agalactiae NOT DETECTED NOT DETECTED Final   Streptococcus pneumoniae NOT DETECTED NOT DETECTED Final   Streptococcus pyogenes NOT DETECTED NOT DETECTED Final   A.calcoaceticus-baumannii NOT DETECTED NOT DETECTED Final   Bacteroides fragilis NOT DETECTED NOT DETECTED Final   Enterobacterales DETECTED (A) NOT DETECTED Final    Comment: Enterobacterales represent a large order of gram negative bacteria, not a single organism. CRITICAL RESULT CALLED TO, READ BACK BY AND VERIFIED WITH: PHARMD S. HURTH [id number] @ 1715 FH    Enterobacter cloacae complex NOT DETECTED NOT DETECTED Final   Escherichia coli DETECTED (A) NOT DETECTED Final    Comment: CRITICAL RESULT CALLED TO, READ BACK BY AND VERIFIED WITH: PHARMD S. HURTH [id number] @ 1715 FH    Klebsiella aerogenes NOT DETECTED NOT DETECTED Final   Klebsiella oxytoca NOT DETECTED NOT DETECTED Final   Klebsiella pneumoniae NOT DETECTED NOT DETECTED Final   Proteus species NOT DETECTED NOT DETECTED Final   Salmonella species NOT DETECTED NOT DETECTED Final   Serratia  marcescens NOT DETECTED NOT DETECTED Final   Haemophilus influenzae NOT DETECTED NOT DETECTED Final   Neisseria meningitidis NOT DETECTED NOT DETECTED Final   Pseudomonas aeruginosa NOT DETECTED NOT DETECTED Final   Stenotrophomonas maltophilia NOT DETECTED NOT DETECTED Final   Candida albicans NOT DETECTED NOT DETECTED Final   Candida auris NOT DETECTED NOT DETECTED Final   Candida glabrata NOT DETECTED NOT DETECTED Final   Candida krusei NOT DETECTED NOT DETECTED Final   Candida parapsilosis NOT DETECTED NOT DETECTED Final   Candida tropicalis NOT DETECTED NOT DETECTED Final   Cryptococcus neoformans/gattii NOT DETECTED NOT DETECTED Final   CTX-M ESBL DETECTED (A) NOT DETECTED Final    Comment: CRITICAL RESULT CALLED TO, READ BACK BY AND VERIFIED WITH: PHARMD S. HURTH [id number] @ 1715 FH (NOTE) Extended spectrum beta-lactamase detected. Recommend a carbapenem as initial therapy.      Carbapenem resistance IMP NOT DETECTED NOT DETECTED Final   Carbapenem resistance KPC NOT DETECTED NOT DETECTED Final   Carbapenem resistance NDM NOT DETECTED NOT DETECTED Final   Carbapenem resist OXA 48 LIKE NOT DETECTED NOT DETECTED Final   Carbapenem resistance VIM NOT DETECTED NOT DETECTED Final    Comment: Performed at [facility], [address] [name], [place] [address]  Blood Culture (routine x 2)     Status: None   Collection Time: 08/12/24  6:24 PM   Specimen: BLOOD RIGHT ARM  Result Value Ref Range Status   Specimen Description BLOOD RIGHT ARM  Final   Special Requests NONE  Final   Culture   Final    NO GROWTH 5 DAYS Performed at [facility], [address], [place], [place] [address]    Report Status 08/17/2024 FINAL  Final  MRSA Next Gen by PCR, Nasal     Status: None   Collection Time: 08/12/24 10:17 PM   Specimen: Nasal Mucosa; Nasal Swab  Result Value Ref Range Status   MRSA by PCR Next Gen NOT DETECTED NOT DETECTED Final    Comment: (NOTE) The GeneXpert MRSA Assay  (FDA approved for  NASAL specimens only), is one component of a comprehensive MRSA colonization surveillance program. It is not intended to diagnose MRSA infection nor to guide or monitor treatment for MRSA infections. Test performance is not FDA approved in patients less than 19 years old. Performed at [facility], [address], [place], [place] [address]     Labs: CBC: Recent Labs  Lab 08/12/24 1814 08/13/24 0440 08/13/24 0906 08/15/24 0850 08/17/24 0417  WBC 7.9 7.8 8.2 9.5 12.1*  NEUTROABS 6.6  --   --   --   --   HGB 10.0* 11.4* 10.3* 10.3* 9.0*  HCT 32.2* 36.8* 32.8* 33.1* 29.4*  MCV 96.7 97.4 95.3 95.9 96.1  PLT 153 148* 149* 152 165   Basic Metabolic Panel: Recent Labs  Lab 08/12/24 1814 08/13/24 0440 08/15/24 0850 08/17/24 0417  NA 137 138 134* 134*  K 3.9 4.2 3.4* 3.4*  CL 97* 96* 92* 93*  CO2 23 23 25 26   GLUCOSE 95 82 106* 79  BUN 60* 64* 49* 39*  CREATININE 11.32* 11.99* 12.40* 11.30*  CALCIUM  7.2* 7.3* 7.6* 7.6*  PHOS  --   --  2.4*  --    Liver Function Tests: Recent Labs  Lab 08/12/24 1814 08/15/24 0850  AST 40  --   ALT 31  --   ALKPHOS 161*  --   BILITOT 1.1  --   PROT 6.8  --   ALBUMIN  3.0* 3.6   CBG: No results for input(s): GLUCAP in the last 168 hours.  Discharge time spent: greater than 30 minutes.  Signed: Adriana DELENA Grams, MD [facility] 08/17/2024

## 2024-08-17 NOTE — TOC Transition Note (Signed)
 Transition of Care [facility]) - Discharge Note   Patient Details  Name: [name] MRN: [id number] Date of Birth: 1966/01/05  Transition of Care [facility]) CM/SW Contact:  Lucie Lunger, LCSWA Phone Number: 08/17/2024, 3:30 PM   Clinical Narrative:    CSW updated that pt will need 10 days of IV home antibiotics and it cannot be completed at HD due to needing them daily. CSW spoke to [facility] with [employer], they are following pt and will deliver supplies to pts home today. Pt and spouse have been educated by Raelyn Eddy has arranged HH RN that will come to pts home to assess. Treatment team updated that pt is good to D/C from [facility] perspective. TOC signing off.   Final next level of care: Home w Home Health Services Barriers to Discharge: Barriers Resolved   Patient Goals and CMS Choice Patient states their goals for this hospitalization and ongoing recovery are:: return home CMS Medicare.gov Compare Post Acute Care list provided to:: Patient Choice offered to / list presented to : Patient, Spouse [place] ownership interest in [facility].provided to::  (n/a)    Discharge Placement                       Discharge Plan and Services Additional resources added to the After Visit Summary for                                       Social Drivers of Health (SDOH) Interventions SDOH Screenings   Food Insecurity: No Food Insecurity (08/12/2024)  Housing: Low Risk  (08/12/2024)  Transportation Needs: No Transportation Needs (08/12/2024)  Utilities: Not At Risk (08/12/2024)  Depression (PHQ2-9): Low Risk  (08/25/2023)  Tobacco Use: Low Risk  (08/12/2024)     Readmission Risk Interventions    08/17/2024    3:30 PM 08/17/2024   10:25 AM 01/25/2023   11:36 AM  Readmission Risk Prevention Plan  Transportation Screening Complete Complete Complete  HRI or Home Care Consult Complete Complete   Social Work Consult for Recovery Care Planning/Counseling Complete Complete    Palliative Care Screening Not Applicable Not Applicable   Medication Review [facility]) Complete Complete Complete  PCP or Specialist appointment within 3-5 days of discharge   Complete  HRI or Home Care Consult   Complete  SW Recovery Care/Counseling Consult   Complete  Palliative Care Screening   Not Applicable  Skilled Nursing Facility   Not Applicable

## 2024-08-20 ENCOUNTER — Telehealth: Payer: Self-pay | Admitting: Internal Medicine

## 2024-08-20 NOTE — Telephone Encounter (Signed)
 Chyrl called to schedule a hospital follow-up. He requested to speak with a nurse regarding the infection in his toe and requested an extension to be out of work. Hospital currently has pt out until 10/14 but he wanted to discuss what is currently going on. Pt is scheduled 10/16 and can be reached at [phone].

## 2024-08-20 NOTE — Telephone Encounter (Signed)
 Message sent via secure chat. EOT date 10/11. Patient questioning labs, picc dressing changes, no OPAT HH orders noted.

## 2024-08-20 NOTE — Progress Notes (Signed)
 Late note entry 08/20/24 0931 D/c noted, contacted out-pt HD clinic [facility] to inform of pt d/c and that pt should have arrived back this morning. D/c summary and last neph note have been faxed over.   [name] Dialysis navigator [phone]

## 2024-08-22 NOTE — Telephone Encounter (Signed)
 Orders sent to Holley Herring, RN with [employer].  Renesha Lizama, BSN, RN

## 2024-08-22 NOTE — Telephone Encounter (Signed)
 Diagnosis: Ecoli esbl bacteremia  Culture Result: 9/28 bcx ecoli esbl     OPAT Orders Discharge antibiotics to be given via PICC line Discharge antibiotics: Ertapenem 500 mg iv daily; on hd days, give after dialysis   Duration: 2 weeks from 08/14/24 End Date: 08/28/24  [facility] Care Per Protocol:  Home health RN for IV administration and teaching; PICC line care and labs.    Labs weekly while on IV antibiotics: _x_ CBC with differential __ BMP _x_ CMP __ CRP __ ESR __ Vancomycin trough __ CK  _x_ Please pull PIC at completion of IV antibiotics __ Please leave PIC in place until doctor has seen patient or been notified  Fax weekly labs to [phone]  Clinic Follow Up Appt: none  @  [facility] [address], [place], [place] [address] Phone: [phone]

## 2024-08-22 NOTE — Telephone Encounter (Signed)
 Triage staff called patient and made aware of new orders and EOT date. sent to infusion team and infusion pharmacy should be in touch with patient regarding supplies/medication.  Patient verbalized understanding. Patient also reminded of upcoming appointment on 10/16  [name] Kleine, LPN

## 2024-08-23 NOTE — Telephone Encounter (Signed)
 Received call from Holley Herring, RN with [employer] to clarify EOT. Original order had EOT as 10/11, most recent OPAT note has EOT as 10/14.   Per Dr. Overton, stick with 10/14 EOT.   Orders sent to Pam.   Duwaine Lowe, BSN, RN

## 2024-08-24 ENCOUNTER — Other Ambulatory Visit: Payer: Self-pay

## 2024-08-24 ENCOUNTER — Other Ambulatory Visit: Payer: Self-pay | Admitting: Orthopedic Surgery

## 2024-08-24 ENCOUNTER — Encounter (HOSPITAL_COMMUNITY): Payer: Self-pay | Admitting: Orthopedic Surgery
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
## 2024-08-24 NOTE — Progress Notes (Signed)
 PCP - Virgilio JONELLE Fairly, MD Cardiologist - [place], NP  Chest x-ray - 08/12/24 EKG - 08/12/24 Stress Test - 07/12/24 ECHO - 01/19/23 Cardiac Cath - 07/12/23  CPAP - Currently wears BiPAP but has been retested and now qualifies for CPAP. Supplies have not been delivered yet  Aspirin  Instructions: LD 10/09  Anesthesia review: Y  Patient verbally denies any shortness of breath, fever, cough and chest pain during phone call   -------------  SDW INSTRUCTIONS given:  Your procedure is scheduled on Sat, Oct 11th.  Report to [facility] Main Emergency Entrance B at 0720 A.M., and check in at the Admitting office.  Call this number if you have problems the morning of surgery:  [phone]   Remember:  Do not eat or drink after midnight the night before your surgery    Take these medicines the morning of surgery with A SIP OF WATER   pantoprazole  (PROTONIX )  metoprolol  succinate (TOPROL -XL)  acetaminophen  (TYLENOL )-if needed  As of today, STOP taking any Aspirin  (unless otherwise instructed by your surgeon) Aleve, Naproxen, Ibuprofen, Motrin, Advil, Goody's, BC's, all herbal medications, fish oil, and all vitamins.                      Do not wear jewelry, make up, or nail polish            Do not wear lotions, powders, perfumes/colognes, or deodorant.            Do not shave 48 hours prior to surgery.  Men may shave face and neck.            Do not bring valuables to the hospital.            [facility] is not responsible for any belongings or valuables.  Do NOT Smoke (Tobacco/Vaping) 24 hours prior to your procedure If you use a CPAP at night, you may bring all equipment for your overnight stay.   Contacts, glasses, dentures or bridgework may not be worn into surgery.      For patients admitted to the hospital, discharge time will be determined by your treatment team.   Patients discharged the day of surgery will not be allowed to drive home, and someone needs to stay  with them for 24 hours.    Special instructions:   [facility]- Preparing For Surgery  Before surgery, you can play an important role. Because skin is not sterile, your skin needs to be as free of germs as possible. You can reduce the number of germs on your skin by washing with CHG (chlorahexidine gluconate) Soap before surgery.  CHG is an antiseptic cleaner which kills germs and bonds with the skin to continue killing germs even after washing.    Oral Hygiene is also important to reduce your risk of infection.  Remember - BRUSH YOUR TEETH THE MORNING OF SURGERY WITH YOUR REGULAR TOOTHPASTE  Please do not use if you have an allergy to CHG or antibacterial soaps. If your skin becomes reddened/irritated stop using the CHG.  Do not shave (including legs and underarms) for at least 48 hours prior to first CHG shower. It is OK to shave your face.  Please follow these instructions carefully.   Shower the NIGHT BEFORE SURGERY and the MORNING OF SURGERY with DIAL Soap.   Pat yourself dry with a CLEAN TOWEL.  Wear CLEAN PAJAMAS to bed the night before surgery  Place CLEAN SHEETS on your bed the night of  your first shower and DO NOT SLEEP WITH PETS.   Day of Surgery: Please shower morning of surgery  Wear Clean/Comfortable clothing the morning of surgery Do not apply any deodorants/lotions.   Remember to brush your teeth WITH YOUR REGULAR TOOTHPASTE.   Questions were answered. Patient verbalized understanding of instructions.
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
## 2024-08-24 NOTE — Anesthesia Preprocedure Evaluation (Addendum)
 Anesthesia Evaluation  Patient identified by MRN, date of birth, ID band Patient awake    Reviewed: Allergy & Precautions, NPO status , Patient's Chart, lab work & pertinent test results  History of Anesthesia Complications Negative for: history of anesthetic complications  Airway Mallampati: III       Dental  (+) Dental Advisory Given   Pulmonary sleep apnea    breath sounds clear to auscultation       Cardiovascular hypertension, + CAD, + Past MI and +CHF  + dysrhythmias  Rhythm:Regular     Neuro/Psych    GI/Hepatic ,GERD  ,,  Endo/Other    Renal/GU Renal disease     Musculoskeletal   Abdominal   Peds  Hematology  (+) Blood dyscrasia, anemia   Anesthesia Other Findings   Reproductive/Obstetrics                              Anesthesia Physical Anesthesia Plan  ASA: 4  Anesthesia Plan: MAC and Regional   Post-op Pain Management: Regional block*   Induction:   PONV Risk Score and Plan: 1 and Treatment may vary due to age or medical condition  Airway Management Planned: Nasal Cannula, Natural Airway and Simple Face Mask  Additional Equipment: None  Intra-op Plan:   Post-operative Plan:   Informed Consent: I have reviewed the patients History and Physical, chart, labs and discussed the procedure including the risks, benefits and alternatives for the proposed anesthesia with the patient or authorized representative who has indicated his/her understanding and acceptance.     Dental advisory given  Plan Discussed with: CRNA  Anesthesia Plan Comments: (See PAT note from 10/10)         Anesthesia Quick Evaluation

## 2024-08-25 ENCOUNTER — Ambulatory Visit (HOSPITAL_COMMUNITY): Payer: Self-pay | Admitting: Medical

## 2024-08-25 ENCOUNTER — Encounter (HOSPITAL_COMMUNITY): Payer: Self-pay | Admitting: Orthopedic Surgery

## 2024-08-25 ENCOUNTER — Inpatient Hospital Stay (HOSPITAL_COMMUNITY)
Admission: AD | Admit: 2024-08-25 | Discharge: 2024-09-13 | DRG: 239 | Disposition: A | Discharge status: Home / Self Care | Attending: Internal Medicine | Admitting: Internal Medicine

## 2024-08-25 ENCOUNTER — Other Ambulatory Visit: Payer: Self-pay

## 2024-08-25 ENCOUNTER — Encounter (HOSPITAL_COMMUNITY)
Admission: AD | Disposition: A | Discharge status: Home / Self Care | Payer: Self-pay | Source: Home / Self Care | Attending: Internal Medicine

## 2024-08-25 DIAGNOSIS — I96 Gangrene, not elsewhere classified: Secondary | ICD-10-CM | POA: Diagnosis not present

## 2024-08-25 DIAGNOSIS — T8612 Kidney transplant failure: Secondary | ICD-10-CM | POA: Diagnosis present

## 2024-08-25 DIAGNOSIS — I480 Paroxysmal atrial fibrillation: Secondary | ICD-10-CM | POA: Diagnosis present

## 2024-08-25 DIAGNOSIS — I10 Essential (primary) hypertension: Secondary | ICD-10-CM

## 2024-08-25 DIAGNOSIS — R001 Bradycardia, unspecified: Secondary | ICD-10-CM | POA: Diagnosis not present

## 2024-08-25 DIAGNOSIS — I9589 Other hypotension: Secondary | ICD-10-CM | POA: Diagnosis not present

## 2024-08-25 DIAGNOSIS — Z89431 Acquired absence of right foot: Principal | ICD-10-CM

## 2024-08-25 DIAGNOSIS — Z9842 Cataract extraction status, left eye: Secondary | ICD-10-CM

## 2024-08-25 DIAGNOSIS — A498 Other bacterial infections of unspecified site: Secondary | ICD-10-CM

## 2024-08-25 DIAGNOSIS — Y831 Surgical operation with implant of artificial internal device as the cause of abnormal reaction of the patient, or of later complication, without mention of misadventure at the time of the procedure: Secondary | ICD-10-CM | POA: Diagnosis not present

## 2024-08-25 DIAGNOSIS — G4733 Obstructive sleep apnea (adult) (pediatric): Secondary | ICD-10-CM | POA: Diagnosis not present

## 2024-08-25 DIAGNOSIS — Z6827 Body mass index (BMI) 27.0-27.9, adult: Secondary | ICD-10-CM

## 2024-08-25 DIAGNOSIS — L89326 Pressure-induced deep tissue damage of left buttock: Secondary | ICD-10-CM | POA: Clinically undetermined

## 2024-08-25 DIAGNOSIS — E782 Mixed hyperlipidemia: Secondary | ICD-10-CM

## 2024-08-25 DIAGNOSIS — Z7982 Long term (current) use of aspirin: Secondary | ICD-10-CM

## 2024-08-25 DIAGNOSIS — Z79899 Other long term (current) drug therapy: Secondary | ICD-10-CM

## 2024-08-25 DIAGNOSIS — Z955 Presence of coronary angioplasty implant and graft: Secondary | ICD-10-CM

## 2024-08-25 DIAGNOSIS — L89316 Pressure-induced deep tissue damage of right buttock: Secondary | ICD-10-CM | POA: Clinically undetermined

## 2024-08-25 DIAGNOSIS — K7460 Unspecified cirrhosis of liver: Secondary | ICD-10-CM | POA: Diagnosis present

## 2024-08-25 DIAGNOSIS — Z95818 Presence of other cardiac implants and grafts: Secondary | ICD-10-CM

## 2024-08-25 DIAGNOSIS — Z8249 Family history of ischemic heart disease and other diseases of the circulatory system: Secondary | ICD-10-CM

## 2024-08-25 DIAGNOSIS — D631 Anemia in chronic kidney disease: Secondary | ICD-10-CM | POA: Diagnosis present

## 2024-08-25 DIAGNOSIS — Y830 Surgical operation with transplant of whole organ as the cause of abnormal reaction of the patient, or of later complication, without mention of misadventure at the time of the procedure: Secondary | ICD-10-CM | POA: Diagnosis present

## 2024-08-25 DIAGNOSIS — M869 Osteomyelitis, unspecified: Secondary | ICD-10-CM | POA: Diagnosis present

## 2024-08-25 DIAGNOSIS — Z9841 Cataract extraction status, right eye: Secondary | ICD-10-CM

## 2024-08-25 DIAGNOSIS — E872 Acidosis, unspecified: Secondary | ICD-10-CM | POA: Diagnosis present

## 2024-08-25 DIAGNOSIS — R570 Cardiogenic shock: Secondary | ICD-10-CM | POA: Diagnosis not present

## 2024-08-25 DIAGNOSIS — N2581 Secondary hyperparathyroidism of renal origin: Secondary | ICD-10-CM | POA: Diagnosis present

## 2024-08-25 DIAGNOSIS — I5082 Biventricular heart failure: Secondary | ICD-10-CM | POA: Diagnosis present

## 2024-08-25 DIAGNOSIS — R4182 Altered mental status, unspecified: Secondary | ICD-10-CM

## 2024-08-25 DIAGNOSIS — N186 End stage renal disease: Secondary | ICD-10-CM | POA: Diagnosis not present

## 2024-08-25 DIAGNOSIS — I5032 Chronic diastolic (congestive) heart failure: Secondary | ICD-10-CM | POA: Diagnosis present

## 2024-08-25 DIAGNOSIS — N400 Enlarged prostate without lower urinary tract symptoms: Secondary | ICD-10-CM | POA: Diagnosis present

## 2024-08-25 DIAGNOSIS — E871 Hypo-osmolality and hyponatremia: Secondary | ICD-10-CM | POA: Diagnosis present

## 2024-08-25 DIAGNOSIS — Z992 Dependence on renal dialysis: Secondary | ICD-10-CM

## 2024-08-25 DIAGNOSIS — I2721 Secondary pulmonary arterial hypertension: Secondary | ICD-10-CM | POA: Diagnosis present

## 2024-08-25 DIAGNOSIS — I132 Hypertensive heart and chronic kidney disease with heart failure and with stage 5 chronic kidney disease, or end stage renal disease: Secondary | ICD-10-CM | POA: Diagnosis present

## 2024-08-25 DIAGNOSIS — Z86718 Personal history of other venous thrombosis and embolism: Secondary | ICD-10-CM

## 2024-08-25 DIAGNOSIS — M86171 Other acute osteomyelitis, right ankle and foot: Secondary | ICD-10-CM

## 2024-08-25 DIAGNOSIS — Z7902 Long term (current) use of antithrombotics/antiplatelets: Secondary | ICD-10-CM

## 2024-08-25 DIAGNOSIS — E875 Hyperkalemia: Secondary | ICD-10-CM | POA: Diagnosis not present

## 2024-08-25 DIAGNOSIS — Z1612 Extended spectrum beta lactamase (ESBL) resistance: Secondary | ICD-10-CM | POA: Diagnosis present

## 2024-08-25 DIAGNOSIS — I2781 Cor pulmonale (chronic): Secondary | ICD-10-CM | POA: Diagnosis present

## 2024-08-25 DIAGNOSIS — K219 Gastro-esophageal reflux disease without esophagitis: Secondary | ICD-10-CM | POA: Diagnosis present

## 2024-08-25 DIAGNOSIS — E876 Hypokalemia: Secondary | ICD-10-CM | POA: Diagnosis not present

## 2024-08-25 DIAGNOSIS — I50810 Right heart failure, unspecified: Secondary | ICD-10-CM

## 2024-08-25 DIAGNOSIS — I2510 Atherosclerotic heart disease of native coronary artery without angina pectoris: Secondary | ICD-10-CM | POA: Diagnosis present

## 2024-08-25 DIAGNOSIS — T82855A Stenosis of coronary artery stent, initial encounter: Secondary | ICD-10-CM | POA: Diagnosis not present

## 2024-08-25 DIAGNOSIS — Z823 Family history of stroke: Secondary | ICD-10-CM

## 2024-08-25 DIAGNOSIS — R579 Shock, unspecified: Secondary | ICD-10-CM | POA: Diagnosis present

## 2024-08-25 DIAGNOSIS — E162 Hypoglycemia, unspecified: Secondary | ICD-10-CM | POA: Diagnosis not present

## 2024-08-25 DIAGNOSIS — I214 Non-ST elevation (NSTEMI) myocardial infarction: Principal | ICD-10-CM | POA: Diagnosis not present

## 2024-08-25 DIAGNOSIS — G9341 Metabolic encephalopathy: Secondary | ICD-10-CM | POA: Diagnosis present

## 2024-08-25 DIAGNOSIS — E43 Unspecified severe protein-calorie malnutrition: Secondary | ICD-10-CM | POA: Diagnosis present

## 2024-08-25 DIAGNOSIS — Z881 Allergy status to other antibiotic agents status: Secondary | ICD-10-CM

## 2024-08-25 DIAGNOSIS — G839 Paralytic syndrome, unspecified: Secondary | ICD-10-CM | POA: Diagnosis present

## 2024-08-25 DIAGNOSIS — Z8619 Personal history of other infectious and parasitic diseases: Secondary | ICD-10-CM

## 2024-08-25 DIAGNOSIS — I4721 Torsades de pointes: Secondary | ICD-10-CM | POA: Diagnosis not present

## 2024-08-25 DIAGNOSIS — Z888 Allergy status to other drugs, medicaments and biological substances status: Secondary | ICD-10-CM

## 2024-08-25 HISTORY — DX: Presence of coronary angioplasty implant and graft: Z95.5

## 2024-08-25 HISTORY — PX: TRANSMETATARSAL AMPUTATION: SHX6197

## 2024-08-25 HISTORY — PX: ACHILLES TENDON LENGTHENING: SHX6455

## 2024-08-25 LAB — CBC
HCT: 32.4 % — ABNORMAL LOW (ref 39.0–52.0)
Hemoglobin: 9.4 g/dL — ABNORMAL LOW (ref 13.0–17.0)
MCH: 29.1 pg (ref 26.0–34.0)
MCHC: 29 g/dL — ABNORMAL LOW (ref 30.0–36.0)
MCV: 100.3 fL — ABNORMAL HIGH (ref 80.0–100.0)
Platelets: 329 K/uL (ref 150–400)
RBC: 3.23 MIL/uL — ABNORMAL LOW (ref 4.22–5.81)
RDW: 17 % — ABNORMAL HIGH (ref 11.5–15.5)
WBC: 9 K/uL (ref 4.0–10.5)
nRBC: 0 % (ref 0.0–0.2)

## 2024-08-25 LAB — BASIC METABOLIC PANEL WITH GFR
Anion gap: 15 (ref 5–15)
BUN: 21 mg/dL — ABNORMAL HIGH (ref 6–20)
CO2: 25 mmol/L (ref 22–32)
Calcium: 7.9 mg/dL — ABNORMAL LOW (ref 8.9–10.3)
Chloride: 100 mmol/L (ref 98–111)
Creatinine, Ser: 7.13 mg/dL — ABNORMAL HIGH (ref 0.61–1.24)
GFR, Estimated: 8 mL/min — ABNORMAL LOW (ref >60)
Glucose, Bld: 81 mg/dL (ref 70–99)
Potassium: 3.7 mmol/L (ref 3.5–5.1)
Sodium: 140 mmol/L (ref 135–145)

## 2024-08-25 SURGERY — AMPUTATION, FOOT, TRANSMETATARSAL
Anesthesia: General | Site: Toe | Laterality: Right

## 2024-08-25 MED ORDER — PROPOFOL 10 MG/ML IV BOLUS
INTRAVENOUS | Status: AC
  Filled 2024-08-25: qty 20

## 2024-08-25 MED ORDER — ONDANSETRON HCL 4 MG/2ML IJ SOLN: 4.0000 mg | Freq: Four times a day (QID) | INTRAMUSCULAR | Status: DC | PRN

## 2024-08-25 MED ORDER — SODIUM CHLORIDE 0.9 % IV SOLN: INTRAVENOUS | Status: DC

## 2024-08-25 MED ORDER — ONDANSETRON HCL 4 MG PO TABS
4.0000 mg | ORAL_TABLET | Freq: Four times a day (QID) | ORAL | Status: DC | PRN
Start: 2024-08-25 — End: 2024-08-28

## 2024-08-25 MED ORDER — FENTANYL CITRATE (PF) 250 MCG/5ML IJ SOLN
INTRAMUSCULAR | Status: AC
  Filled 2024-08-25: qty 5

## 2024-08-25 MED ORDER — FAMOTIDINE 20 MG PO TABS
20.0000 mg | ORAL_TABLET | Freq: Every day | ORAL | Status: DC
  Administered 2024-08-25 – 2024-08-28 (×4): 20 mg via ORAL
  Filled 2024-08-25 (×4): qty 1

## 2024-08-25 MED ORDER — MORPHINE SULFATE (PF) 2 MG/ML IV SOLN
0.5000 mg | INTRAVENOUS | Status: DC | PRN
  Administered 2024-08-25 – 2024-08-26 (×3): 1 mg via INTRAVENOUS
  Filled 2024-08-25 (×3): qty 1

## 2024-08-25 MED ORDER — TAMSULOSIN HCL 0.4 MG PO CAPS
0.4000 mg | ORAL_CAPSULE | Freq: Every evening | ORAL | Status: DC
  Administered 2024-08-25 – 2024-09-06 (×13): 0.4 mg via ORAL
  Filled 2024-08-25 (×13): qty 1

## 2024-08-25 MED ORDER — PHENYLEPHRINE HCL-NACL 20-0.9 MG/250ML-% IV SOLN
INTRAVENOUS | Status: DC | PRN
  Administered 2024-08-25: 35 ug/min via INTRAVENOUS

## 2024-08-25 MED ORDER — AMIODARONE HCL 200 MG PO TABS
200.0000 mg | ORAL_TABLET | Freq: Every day | ORAL | Status: DC
  Administered 2024-08-25 – 2024-08-26 (×2): 200 mg via ORAL
  Filled 2024-08-25 (×3): qty 1

## 2024-08-25 MED ORDER — FENTANYL CITRATE (PF) 250 MCG/5ML IJ SOLN
INTRAMUSCULAR | Status: DC | PRN
  Administered 2024-08-25: 25 ug via INTRAVENOUS

## 2024-08-25 MED ORDER — METOPROLOL SUCCINATE ER 25 MG PO TB24
12.5000 mg | ORAL_TABLET | Freq: Every day | ORAL | Status: DC
  Administered 2024-08-26: 12.5 mg via ORAL
  Filled 2024-08-25 (×2): qty 1

## 2024-08-25 MED ORDER — MIDAZOLAM HCL 5 MG/5ML IJ SOLN
INTRAMUSCULAR | Status: DC | PRN
  Administered 2024-08-25 (×2): 1 mg via INTRAVENOUS

## 2024-08-25 MED ORDER — ACETAMINOPHEN 325 MG PO TABS
325.0000 mg | ORAL_TABLET | Freq: Four times a day (QID) | ORAL | Status: DC | PRN
  Administered 2024-08-26 – 2024-09-13 (×28): 650 mg via ORAL
  Filled 2024-08-25 (×28): qty 2

## 2024-08-25 MED ORDER — FENTANYL CITRATE (PF) 100 MCG/2ML IJ SOLN: 25.0000 ug | INTRAMUSCULAR | Status: DC | PRN

## 2024-08-25 MED ORDER — CHLORHEXIDINE GLUCONATE 0.12 % MT SOLN
15.0000 mL | Freq: Once | OROMUCOSAL | Status: AC
  Administered 2024-08-25: 15 mL via OROMUCOSAL

## 2024-08-25 MED ORDER — CEFAZOLIN SODIUM-DEXTROSE 2-4 GM/100ML-% IV SOLN: 2.0000 g | INTRAVENOUS | Status: DC

## 2024-08-25 MED ORDER — ERTAPENEM IV (FOR PTA / DISCHARGE USE ONLY): 500.0000 mg | INTRAVENOUS | Status: DC

## 2024-08-25 MED ORDER — HYDROCODONE-ACETAMINOPHEN 7.5-325 MG PO TABS
1.0000 | ORAL_TABLET | ORAL | Status: DC | PRN
  Administered 2024-08-26: 2 via ORAL
  Filled 2024-08-25: qty 2

## 2024-08-25 MED ORDER — SODIUM CHLORIDE 0.9 % IV SOLN: INTRAVENOUS | Status: DC | PRN

## 2024-08-25 MED ORDER — ONDANSETRON HCL 4 MG/2ML IJ SOLN
4.0000 mg | Freq: Four times a day (QID) | INTRAMUSCULAR | Status: DC | PRN
Start: 2024-08-25 — End: 2024-08-28

## 2024-08-25 MED ORDER — SENNA 8.6 MG PO TABS
1.0000 | ORAL_TABLET | Freq: Two times a day (BID) | ORAL | Status: DC
  Administered 2024-08-25 – 2024-08-29 (×3): 8.6 mg via ORAL
  Filled 2024-08-25 (×9): qty 1

## 2024-08-25 MED ORDER — VASOPRESSIN 20 UNIT/ML IV SOLN
INTRAVENOUS | Status: DC | PRN
  Administered 2024-08-25 (×2): 1 [IU] via INTRAVENOUS

## 2024-08-25 MED ORDER — CALCITRIOL 0.25 MCG PO CAPS
1.0000 ug | ORAL_CAPSULE | ORAL | Status: DC
  Administered 2024-08-29 – 2024-09-12 (×7): 1 ug via ORAL
  Filled 2024-08-25 (×8): qty 2

## 2024-08-25 MED ORDER — PROPOFOL 500 MG/50ML IV EMUL
INTRAVENOUS | Status: DC | PRN
  Administered 2024-08-25: 35 ug/kg/min via INTRAVENOUS

## 2024-08-25 MED ORDER — HYDROCODONE-ACETAMINOPHEN 5-325 MG PO TABS
1.0000 | ORAL_TABLET | ORAL | Status: DC | PRN
  Administered 2024-08-25 (×2): 1 via ORAL
  Filled 2024-08-25 (×3): qty 1

## 2024-08-25 MED ORDER — DOCUSATE SODIUM 100 MG PO CAPS
100.0000 mg | ORAL_CAPSULE | Freq: Two times a day (BID) | ORAL | Status: DC
  Administered 2024-08-25 – 2024-08-29 (×3): 100 mg via ORAL
  Filled 2024-08-25 (×10): qty 1

## 2024-08-25 MED ORDER — ACETAMINOPHEN 10 MG/ML IV SOLN: 1000.0000 mg | Freq: Once | INTRAVENOUS | Status: DC | PRN

## 2024-08-25 MED ORDER — POLYETHYLENE GLYCOL 3350 17 G PO PACK: 17.0000 g | PACK | Freq: Every day | ORAL | Status: DC | PRN

## 2024-08-25 MED ORDER — ORAL CARE MOUTH RINSE
15.0000 mL | Freq: Once | OROMUCOSAL | Status: AC
Start: 2024-08-25 — End: 2024-08-25

## 2024-08-25 MED ORDER — MIDAZOLAM HCL 2 MG/2ML IJ SOLN
INTRAMUSCULAR | Status: AC
Start: 2024-08-25 — End: 2024-08-25
  Filled 2024-08-25: qty 2

## 2024-08-25 MED ORDER — PHENYLEPHRINE HCL (PRESSORS) 10 MG/ML IV SOLN
INTRAVENOUS | Status: DC | PRN
  Administered 2024-08-25 (×2): 160 ug via INTRAVENOUS

## 2024-08-25 MED ORDER — ATORVASTATIN CALCIUM 80 MG PO TABS
80.0000 mg | ORAL_TABLET | Freq: Every day | ORAL | Status: DC
  Administered 2024-08-25 – 2024-09-12 (×19): 80 mg via ORAL
  Filled 2024-08-25 (×10): qty 1
  Filled 2024-08-25: qty 2
  Filled 2024-08-25 (×8): qty 1

## 2024-08-25 MED ORDER — CEFAZOLIN SODIUM-DEXTROSE 2-4 GM/100ML-% IV SOLN
INTRAVENOUS | Status: AC
  Filled 2024-08-25: qty 100

## 2024-08-25 MED ORDER — MIDODRINE HCL 5 MG PO TABS
15.0000 mg | ORAL_TABLET | Freq: Three times a day (TID) | ORAL | Status: DC
  Administered 2024-08-25 – 2024-08-27 (×4): 15 mg via ORAL
  Filled 2024-08-25 (×5): qty 3

## 2024-08-25 MED ORDER — PANTOPRAZOLE SODIUM 40 MG PO TBEC
40.0000 mg | DELAYED_RELEASE_TABLET | Freq: Two times a day (BID) | ORAL | Status: DC
Start: 2024-08-25 — End: 2024-08-25

## 2024-08-25 MED ORDER — SODIUM CHLORIDE 0.9 % IV SOLN
500.0000 mg | INTRAVENOUS | Status: DC
  Administered 2024-08-25 – 2024-08-28 (×3): 500 mg via INTRAVENOUS
  Filled 2024-08-25 (×5): qty 500

## 2024-08-25 MED ORDER — ASPIRIN 81 MG PO TBEC
81.0000 mg | DELAYED_RELEASE_TABLET | Freq: Every morning | ORAL | Status: DC
  Administered 2024-08-26 – 2024-08-27 (×2): 81 mg via ORAL
  Filled 2024-08-25 (×2): qty 1

## 2024-08-25 MED ORDER — 0.9 % SODIUM CHLORIDE (POUR BTL) OPTIME
TOPICAL | Status: DC | PRN
  Administered 2024-08-25: 1000 mL

## 2024-08-25 MED ORDER — OXYCODONE HCL 5 MG PO TABS: 5.0000 mg | ORAL_TABLET | Freq: Once | ORAL | Status: DC | PRN

## 2024-08-25 MED ORDER — PANTOPRAZOLE SODIUM 40 MG PO TBEC
40.0000 mg | DELAYED_RELEASE_TABLET | Freq: Every day | ORAL | Status: DC
  Administered 2024-08-25 – 2024-08-26 (×2): 40 mg via ORAL
  Filled 2024-08-25 (×5): qty 1

## 2024-08-25 MED ORDER — FENTANYL CITRATE (PF) 100 MCG/2ML IJ SOLN
INTRAMUSCULAR | Status: AC
  Filled 2024-08-25: qty 2

## 2024-08-25 MED ORDER — ALBUMIN HUMAN 5 % IV SOLN: INTRAVENOUS | Status: DC | PRN

## 2024-08-25 MED ORDER — BISACODYL 10 MG RE SUPP: 10.0000 mg | Freq: Every day | RECTAL | Status: DC | PRN

## 2024-08-25 MED ORDER — ALLOPURINOL 100 MG PO TABS
100.0000 mg | ORAL_TABLET | Freq: Every morning | ORAL | Status: DC
  Administered 2024-08-26 – 2024-09-13 (×19): 100 mg via ORAL
  Filled 2024-08-25 (×20): qty 1

## 2024-08-25 MED ORDER — OXYCODONE HCL 5 MG/5ML PO SOLN: 5.0000 mg | Freq: Once | ORAL | Status: DC | PRN

## 2024-08-25 MED ORDER — FERRIC CITRATE 1 GM 210 MG(FE) PO TABS
420.0000 mg | ORAL_TABLET | Freq: Three times a day (TID) | ORAL | Status: DC
  Administered 2024-08-25 – 2024-09-03 (×22): 420 mg via ORAL
  Filled 2024-08-25 (×29): qty 2

## 2024-08-25 MED ORDER — HEPARIN SODIUM (PORCINE) 5000 UNIT/ML IJ SOLN
5000.0000 [IU] | Freq: Three times a day (TID) | INTRAMUSCULAR | Status: DC
  Administered 2024-08-26 – 2024-08-27 (×6): 5000 [IU] via SUBCUTANEOUS
  Filled 2024-08-25 (×6): qty 1

## 2024-08-25 SURGICAL SUPPLY — 28 items
BAG COUNTER SPONGE SURGICOUNT (BAG) ×2 IMPLANT
BLADE LONG MED 31X9 (MISCELLANEOUS) IMPLANT
BNDG COMPR ESMARK 4X3 LF (GAUZE/BANDAGES/DRESSINGS) ×2 IMPLANT
BNDG ELASTIC 4X5.8 VLCR NS LF (GAUZE/BANDAGES/DRESSINGS) IMPLANT
BOOT WALKER LARGE (SOFTGOODS) IMPLANT
BRUSH SCRUB EZ 4% CHG (MISCELLANEOUS) ×2 IMPLANT
CUFF TRNQT CYL 24X4X40X1 (TOURNIQUET CUFF) IMPLANT
DRAPE U-SHAPE 47X51 STRL (DRAPES) ×2 IMPLANT
DRSG MEPITEL 4X7.2 (GAUZE/BANDAGES/DRESSINGS) ×2 IMPLANT
ELECTRODE REM PT RTRN 9FT ADLT (ELECTROSURGICAL) ×2 IMPLANT
GAUZE PAD ABD 8X10 STRL (GAUZE/BANDAGES/DRESSINGS) ×4 IMPLANT
GAUZE SPONGE 4X4 12PLY STRL (GAUZE/BANDAGES/DRESSINGS) ×2 IMPLANT
GLOVE BIO SURGEON STRL SZ8 (GLOVE) ×4 IMPLANT
GLOVE BIOGEL PI IND STRL 8 (GLOVE) ×4 IMPLANT
GOWN STRL REUS W/ TWL LRG LVL3 (GOWN DISPOSABLE) ×2 IMPLANT
GOWN STRL REUS W/ TWL XL LVL3 (GOWN DISPOSABLE) ×4 IMPLANT
KIT BASIN OR (CUSTOM PROCEDURE TRAY) ×2 IMPLANT
KIT TURNOVER KIT B (KITS) ×2 IMPLANT
PACK ORTHO EXTREMITY (CUSTOM PROCEDURE TRAY) ×2 IMPLANT
PAD ARMBOARD POSITIONER FOAM (MISCELLANEOUS) ×4 IMPLANT
PADDING CAST ABS COTTON 4X4 ST (CAST SUPPLIES) ×2 IMPLANT
SOLN 0.9% NACL 1000 ML (IV SOLUTION) ×2 IMPLANT
SOLN 0.9% NACL POUR BTL 1000ML (IV SOLUTION) ×2 IMPLANT
SUT ETHILON 2 0 PSLX (SUTURE) ×4 IMPLANT
SUT VIC AB 2-0 CT1 TAPERPNT 27 (SUTURE) IMPLANT
TOWEL GREEN STERILE (TOWEL DISPOSABLE) ×2 IMPLANT
TUBE CONNECTING 12X1/4 (SUCTIONS) ×2 IMPLANT
UNDERPAD 30X36 HEAVY ABSORB (UNDERPADS AND DIAPERS) ×2 IMPLANT

## 2024-08-25 NOTE — Assessment & Plan Note (Addendum)
 08/25/24 stable. Prn cpap.  08/26/24 stable.  08/27/24 stable.
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
## 2024-08-25 NOTE — Progress Notes (Addendum)
 [facility]    Date of Admission:  08/25/2024   Total days of antibiotics 1   ID: [name] is a 58 y.o. male with hx of ESBL ecoli bacteremia and right foot osteomyelitis who was seen by dr hewitt with worsening right gangrenous features. The patient underwent right TM amputation as source control on 10/11. ID asked to weigh in on management  Principal Problem:   S/P transmetatarsal amputation of foot, right (HCC) Active Problems:   Paroxysmal atrial fibrillation (HCC) - s/p watchman device   ESRD on dialysis (HCC) - on M, W, F   Chronic asymptomatic hypotension   Mixed hyperlipidemia   OSA on CPAP   BPH (benign prostatic hyperplasia)   Chronic diastolic CHF (congestive heart failure) (HCC)   Gangrene of right foot (HCC)   Infection due to ESBL-producing Escherichia coli   Presence of Watchman left atrial appendage closure device - placed at [facility]    Subjective: Afebrile. Slight soreness at surgery site of right foot  Medications:   [START ON 08/26/2024] allopurinol  100 mg Oral q AM   amiodarone   200 mg Oral Daily   [START ON 08/26/2024] aspirin  EC  81 mg Oral q AM   atorvastatin   80 mg Oral QHS   [START ON 08/27/2024] calcitRIOL   1 mcg Oral Q M,W,F-HD   docusate sodium   100 mg Oral BID   famotidine   20 mg Oral QHS   ferric citrate   420 mg Oral TID WC   [START ON 08/26/2024] heparin   5,000 Units Subcutaneous Q8H   [START ON 08/26/2024] metoprolol  succinate  12.5 mg Oral Daily   midodrine   15 mg Oral TID WC   pantoprazole   40 mg Oral Daily   senna  1 tablet Oral BID   tamsulosin   0.4 mg Oral QPM    Objective: Vital signs in last 24 hours: Temp:  [97.8 F (36.6 C)-98.1 F (36.7 C)] 97.8 F (36.6 C) (10/11 1120) Pulse Rate:  [62-66] 66 (10/11 1237) Resp:  [14-20] 18 (10/11 1237) BP: (97-127)/(41-85) 113/59 (10/11 1237) SpO2:  [92 %-100 %] 98 % (10/11 1237) Weight:  [94.5 kg] 94.5 kg (10/11 0734)  Physical  Exam  Constitutional: He is oriented to person, place, and time. He appears well-developed and well-nourished. No distress.  HENT:  Mouth/Throat: Oropharynx is clear and moist. No oropharyngeal exudate.  Cardiovascular: Normal rate, regular rhythm and normal heart sounds. Exam reveals no gallop and no friction rub.  No murmur heard.  Pulmonary/Chest: Effort normal and breath sounds normal. No respiratory distress. He has no wheezes.  Abdominal: Soft. Bowel sounds are normal. He exhibits no distension. There is no tenderness.  Ext= right foot wrapped from surgery Neurological: He is alert and oriented to person, place, and time.  Skin: Skin is warm and dry. No rash noted. No erythema.  Psychiatric: He has a normal mood and affect. His behavior is normal.   Lab Results Recent Labs    08/25/24 0836  WBC 9.0  HGB 9.4*  HCT 32.4*  NA 140  K 3.7  CL 100  CO2 25  BUN 21*  CREATININE 7.13*   Liver Panel No results for input(s): PROT, ALBUMIN , AST, ALT, ALKPHOS, BILITOT, BILIDIR, IBILI in the last 72 hours. Sedimentation Rate No results for input(s): ESRSEDRATE in the last 72 hours. C-Reactive Protein No results for input(s): CRP in the last 72 hours.  Microbiology: 9/28 Escherichia coli      MIC  AMPICILLIN >=32 RESIST... Resistant    AMPICILLIN/SULBACTAM >=32 RESIST... Resistant    CEFAZOLIN  (NON-URINE) >=32 RESIST... Resistant    CEFEPIME  >=32 RESIST... Resistant    CEFTRIAXONE  >=64 RESIST... Resistant    CIPROFLOXACIN >=4 RESISTANT Resistant    ERTAPENEM <=0.12 SENS... Sensitive    GENTAMICIN <=1 SENSITIVE Sensitive    MEROPENEM <=0.25 SENS... Sensitive    PIP/TAZO  Sensitive 1    TRIMETH/SULFA >=320 RESIS... Resistant     Studies/Results: No results found.   Assessment/Plan: 58yo M with right gangrenous foot osteomyelitis and recent hx of esbl ecoli bacteremia ( on 9/28) currently on ertapenem through 08/28/2024. -now has source control with  osteomyelitis since having his TM amputation of right foot - recommend to continue ertapenem through 08/28/24 to complete course of treatment for esbl ecoli bacteremia - can pull picc line after infusion on 08/28/24 - continue wound care per dr hewitt  ESRD on HD- continue on current regimen.  Will sign off. Will re-schedule appt with dr vu  evaluation of this patient requires complex antimicrobial therapy evaluation and counseling and isolation needs for disease transmission risk assessment and mitigation.   I have personally spent 35 minutes involved in face-to-face and non-face-to-face activities for this patient on the day of the visit. Professional time spent includes the following activities: Preparing to see the patient (review of tests), Obtaining and/or reviewing separately obtained history (admission/discharge record), Performing a medically appropriate examination and/or evaluation , Ordering medications/tests/procedures, referring and communicating with other health care professionals, Documenting clinical information in the EMR, Independently interpreting results (not separately reported), Communicating results to the patient/family/caregiver, Counseling and educating the patient/family/caregiver and Care coordination (not separately reported).     [facility] Pager: [phone]  08/25/2024, 1:30 PM

## 2024-08-25 NOTE — Assessment & Plan Note (Addendum)
 08/25/24 stable. On amiodarone  and Toprol -XL. On ASA. Not on systemic anticoagulation due to hx of small bowel AVMs and hx of GI bleeing. S/p watchman device  08/26/24 stable.  08/27/24 due to bradycardia. Will stop toprol -xl and amiodarone  for now.

## 2024-08-25 NOTE — Progress Notes (Signed)
 Orthopedic Tech Progress Note Patient Details:  [name] 1966-02-21 [id number]  Ortho Devices Type of Ortho Device: CAM walker Ortho Device/Splint Interventions: Ordered, Other (comment)OR RN called requesting a LARGE CAM WALKER BOOT, dropped off to OR DESK    Post Interventions Patient Tolerated: Other (comment) Instructions Provided: Other (comment)  Delanna LITTIE Pac 08/25/2024, 10:29 AM

## 2024-08-25 NOTE — Assessment & Plan Note (Addendum)
 08/25/24 nephrology consulted for routine HD  08/26/24 will need HD tomorrow.  08/27/24 nephrology consulted for HD.

## 2024-08-25 NOTE — Transfer of Care (Signed)
 Immediate Anesthesia Transfer of Care Note  Patient: [name]  Procedure(s) Performed: AMPUTATION, FOOT, TRANSMETATARSAL (Right: Toe) LENGTHENING, TENDON, ACHILLES (Right)  Patient Location: PACU  Anesthesia Type:MAC  Level of Consciousness: awake, alert , oriented, and patient cooperative  Airway & Oxygen Therapy: Patient Spontanous Breathing and Patient connected to face mask oxygen  Post-op Assessment: Report given to RN, Post -op Vital signs reviewed and unstable, Anesthesiologist notified, and Patient moving all extremities  Post vital signs: Reviewed and stable  Last Vitals:  Vitals Value Taken Time  BP 126/46 08/25/24 11:20  Temp 36.6 C 08/25/24 11:20  Pulse 61 08/25/24 11:24  Resp 29 08/25/24 11:24  SpO2 97 % 08/25/24 11:24  Vitals shown include unfiled device data.  Last Pain:  Vitals:   08/25/24 0818  TempSrc: Oral         Complications: No notable events documented.

## 2024-08-25 NOTE — Assessment & Plan Note (Addendum)
 08/25/24 continue flomax .  08/26/24 stable.  08/27/24 stable.

## 2024-08-25 NOTE — Assessment & Plan Note (Addendum)
 08/25/24 orthopedics to manage and arrange for post-op followup.  08/26/24 change pain meds to oxycodone  5 - 10 mg q4h prn moderate/severe pain, IV dilaudid  1 mg q2h prn for breakthrough pain. Pt needs PT consult.  08/27/24 awaiting ortho f/u note for discharge instructions. Stop IV dilaudid .

## 2024-08-25 NOTE — Op Note (Signed)
 08/25/2024  11:17 AM  PATIENT:  [name]  58 y.o. male  PRE-OPERATIVE DIAGNOSIS: 1.  Gangrene of right foot 2.  Short right Achilles tendon  POST-OPERATIVE DIAGNOSIS: Same  Procedure(s):  Right Achilles tendon lengthening Right foot transmetatarsal amputation  SURGEON:  Norleen Armor, MD  ASSISTANT: None  ANESTHESIA:   MAC, regional  EBL:  minimal   TOURNIQUET: Less than 20 minutes with an ankle Esmarch  COMPLICATIONS:  None apparent  DISPOSITION:  Extubated, awake and stable to recovery.  Specimen: Right forefoot to pathology  INDICATION FOR PROCEDURE: 58 year old male with past medical history significant for end-stage renal disease on hemodialysis presented to my office yesterday with right forefoot gangrene.  He was recently admitted to the hospitalist service for fever and chills.  Blood cultures were positive, and he was discharged home on IV antibiotics.  He presents now for right foot transmetatarsal amputation and heel cord lengthening.  The risks and benefits of the alternative treatment options have been discussed in detail.  The patient wishes to proceed with surgery and specifically understands risks of bleeding, infection, nerve damage, blood clots, need for additional surgery, amputation and death.   PROCEDURE IN DETAIL: After preoperative consent was obtained and the correct operative site was identified, the patient was brought to the operating room and placed upon on the operating table.  IV sedation was administered following a regional block in preop holding.  The right lower extremity was prepped and draped in standard sterile fashion.  Preoperative antibiotics were administered.  A surgical timeout was taken.  The Achilles tendon was lengthened with a triple hemisection technique with a #15 scalpel.  The ankle was then dorsiflex 30 degrees with the knee extended.  The foot was then exsanguinated and a 4 inch Esmarch tourniquet wrapped around the ankle.  A  fishmouth incision was marked on the skin around the forefoot proximal to the area of necrosis.  The incision was made and sharp dissection carried down through the subcutaneous tissues.  The subcutaneous tissues were elevated from the metatarsals with a periosteal elevator.  The soft tissues were carefully protected while an oscillating saw was used to cut through the 1st through 5th metatarsals beveling the cuts appropriately.  The plantar soft tissues were then sharply divided contouring the flap appropriately.  The forefoot was passed off the field as a specimen to pathology.  The neurovascular bundles were all cauterized.  The cut surfaces of bone were smoothed with a rasp.  The wound was irrigated copiously.  The incision was then closed with simple and horizontal mattress sutures of 2-0 nylon.  Sterile dressings were applied followed by a compression wrap and a tall cam boot.  The tourniquet was released after application of the dressings.  The patient was awakened from anesthesia and transported to the recovery room in stable condition.   FOLLOW UP PLAN: Weightbearing as tolerated in a tall cam boot.  ID consult for follow-up of discharge antibiotics from his last admission.  I spoke with Dr. Luiz.  The patient will be re-admitted to the hospitalist service.  I spoke with Dr. Laurence.  We will follow him while an inpatient.  Continue IV antibiotics per the ID recommendations.

## 2024-08-25 NOTE — Assessment & Plan Note (Addendum)
 08/25/24 continue IV Invanz for now. He was suppose to remain on IV invanz therapy total of 2 weeks 08/14/24 - to -08/28/24. Defer to ID if this will change.  08/26/24 ID rec completing IV invanz for his full 14 course. Source control obtained. End date of therapy 08-28-2024.  08/27/24 s/p right transmet amputation. Will complete IV invanz on 08-28-2024. ID recommends no further abx.

## 2024-08-25 NOTE — Plan of Care (Signed)

## 2024-08-25 NOTE — Assessment & Plan Note (Signed)
 08/25/24 chronic. He does not require systemic anticoagulation for his PAF.  08/26/24 chronic. Stable.  08/27/24 chronic.

## 2024-08-25 NOTE — Assessment & Plan Note (Addendum)
 08/25/24 s/p right transmetatarsal amputation. Ortho is consulting ID for abx recs.  08/26/24 resolved with transmetatarsal amputation. ID rec completing IV invanz for his full 14 course. Source control obtained.  08/27/24 s/p right transmet amputation. Will complete IV invanz on 08-28-2024. ID recommends no further abx.

## 2024-08-25 NOTE — Assessment & Plan Note (Addendum)
 08/25/24 chronic. Normal SBP 70-90 mm Hg. Pt is asymptomatic.continue tid midodrine .  08/26/24 stable. BP elevated today due to pain.  08/27/24 continue midodrine .

## 2024-08-25 NOTE — Assessment & Plan Note (Addendum)
 08/25/24 euvolemic. Volume status managed by dialysis  08/26/24 euvolemic. Stable.  08/27/24 euvolemic.

## 2024-08-25 NOTE — Hospital Course (Addendum)
 CC: gangrene right foot, s/p right transmetatarsal amputation HPI: 58 year old AA male with PMHx ESRD on HD (MWF), CAD s/p DES to LAD and LCx, HFpEF, pAF s/p Watchman 2/2 hx of AVMS, chronic hypotension on midodrine , h/o DVT no longer on anticoagulation, who was recently discharged from [facility] on 08/17/2024 with with a discharge diagnosis of ESBL E. coli septicemia from presumed osteomyelitis.  He was discharged to home with IV Invanz via midline in his right upper extremity.  He was supposed to complete 2 weeks of Invanz with the end date of 08/28/2024.  He presented to the orthopedic clinic on 10/10 and was noted to have gangrene of the right foot.  Underwent transmetatarsal amputation by Dr. Kit on 10/11.  [facility] hospitalist consulted for admission postoperatively.

## 2024-08-25 NOTE — Assessment & Plan Note (Addendum)
 08/25/24 continue lipitor   08/26/24 stable.  08/27/24 stable.

## 2024-08-26 DIAGNOSIS — Z89431 Acquired absence of right foot: Secondary | ICD-10-CM | POA: Diagnosis not present

## 2024-08-26 DIAGNOSIS — A498 Other bacterial infections of unspecified site: Secondary | ICD-10-CM | POA: Diagnosis not present

## 2024-08-26 DIAGNOSIS — I96 Gangrene, not elsewhere classified: Secondary | ICD-10-CM | POA: Diagnosis not present

## 2024-08-26 DIAGNOSIS — I9589 Other hypotension: Secondary | ICD-10-CM | POA: Diagnosis not present

## 2024-08-26 LAB — HEPATITIS B SURFACE ANTIGEN: Hepatitis B Surface Ag: NONREACTIVE

## 2024-08-26 MED ORDER — OXYCODONE HCL 5 MG PO TABS
10.0000 mg | ORAL_TABLET | ORAL | Status: DC | PRN
  Administered 2024-08-27 (×3): 10 mg via ORAL
  Filled 2024-08-26 (×3): qty 2

## 2024-08-26 MED ORDER — OXYCODONE HCL 5 MG PO TABS
5.0000 mg | ORAL_TABLET | ORAL | Status: DC | PRN
  Administered 2024-08-26: 5 mg via ORAL
  Filled 2024-08-26: qty 1

## 2024-08-26 MED ORDER — OXYCODONE HCL 5 MG PO TABS
10.0000 mg | ORAL_TABLET | Freq: Once | ORAL | Status: AC
  Administered 2024-08-26: 10 mg via ORAL
  Filled 2024-08-26: qty 2

## 2024-08-26 MED ORDER — HYDROMORPHONE HCL 1 MG/ML IJ SOLN
1.0000 mg | INTRAMUSCULAR | Status: DC | PRN
  Administered 2024-08-26: 1 mg via INTRAVENOUS
  Filled 2024-08-26: qty 1

## 2024-08-26 NOTE — Progress Notes (Signed)
 PROGRESS NOTE    [name]  FMW:[id number] DOB: June 18, 1966 DOA: 08/25/2024 PCP: Maree Virgilio SAUNDERS, MD  Subjective: Pt seen and examined. Met with pt's wife Jackolyn at bedside. Pt's nerve block has worn off. Needs more pain meds. Changed to oxycodone  prn and IV dilaudid .    Hospital Course: CC: gangrene right foot, s/p right transmetatarsal amputation HPI: 58 year old African-American male history of end-stage renal disease on dialysis Monday Wednesday Friday, chronic hypotension with normal systolic blood pressures in the 70s 80s and 90s, history of small bowel AVMs, history of DVT no longer on anticoagulation, history of paroxysmal atrial fibrillation status post Watchman device no longer requiring systemic anticoagulation who was recently discharged from [facility] on 08/17/2024 with with a discharge diagnosis of ESBL E. coli septicemia from presumed osteomyelitis.  He was discharged to home with IV Invanz via midline in his right upper extremity.  He was supposed to complete 2 weeks of Invanz with the end date of 08/28/2024.  He presented to the orthopedic clinic yesterday and was noted to have gangrene of the right foot.  He was taken the operating room today by Dr. Kit where he had a transmetatarsal amputation performed.  [facility] hospitalist consulted for admission postoperatively.  Patient states that his blood pressure normally runs low with systolics ranging from 70-90 on a regular basis.  He is asymptomatic.  Significant Events: Admitted 08/25/2024 for s/p right transmetatarsal amputation   Admission Labs: WBC 9.0, HgB 9.4, plt 329 Na 140, K 3.7, CO2 of 26, BUN 21, Scr 7.13, glu 81  Admission Imaging Studies:   Significant Labs:   Significant Imaging Studies:   Antibiotic Therapy: Anti-infectives (From admission, onward)    Start     Dose/Rate Route Frequency Ordered Stop   08/25/24 0745  ceFAZolin  (ANCEF ) IVPB 2g/100 mL premix        2 g 200 mL/hr over  30 Minutes Intravenous On call to O.R. 08/25/24 0737 08/26/24 0559       Procedures: Right forefoot transmetatarsal amputation  Consultants: Orthopedics ID nephrology    Assessment and Plan: * S/P transmetatarsal amputation of foot, right (HCC) 08/25/24 orthopedics to manage and arrange for post-op followup.  08/26/24 change pain meds to oxycodone  5 - 10 mg q4h prn moderate/severe pain, IV dilaudid  1 mg q2h prn for breakthrough pain. Pt needs PT consult.   Gangrene of right foot (HCC) 08/25/24 s/p right transmetatarsal amputation. Ortho is consulting ID for abx recs.  08/26/24 resolved with transmetatarsal amputation. ID rec completing IV invanz for his full 14 course. Source control obtained.   Chronic asymptomatic hypotension 08/25/24 chronic. Normal SBP 70-90 mm Hg. Pt is asymptomatic.continue tid midodrine .  08/26/24 stable. BP elevated today due to pain.   Infection due to ESBL-producing Escherichia coli 08/25/24 continue IV Invanz for now. He was suppose to remain on IV invanz therapy total of 2 weeks 08/14/24 - to -08/28/24. Defer to ID if this will change.  08/26/24 ID rec completing IV invanz for his full 14 course. Source control obtained. End date of therapy 08-28-2024.   Presence of Watchman left atrial appendage closure device - placed at [facility] 08/25/24 chronic. He does not require systemic anticoagulation for his PAF.  08/26/24 chronic. Stable.   Chronic diastolic CHF (congestive heart failure) (HCC) 08/25/24 euvolemic. Volume status managed by dialysis  08/26/24 euvolemic. Stable.   BPH (benign prostatic hyperplasia) 08/25/24 continue flomax .  08/26/24 stable.   OSA on CPAP 08/25/24 stable. Prn cpap.  08/26/24  stable.   Mixed hyperlipidemia 08/25/24 continue lipitor   08/26/24 stable.   ESRD on dialysis [facility]) - on M, W, F 08/25/24 nephrology consulted for routine HD  08/26/24 will need HD  tomorrow.   Paroxysmal atrial fibrillation (HCC) - s/p watchman device 08/25/24 stable. On amiodarone  and Toprol -XL. On ASA. Not on systemic anticoagulation due to hx of small bowel AVMs and hx of GI bleeing. S/p watchman device  08/26/24 stable.   DVT prophylaxis: heparin  injection 5,000 Units Start: 08/26/24 0600 SCDs Start: 08/25/24 1242 SCDs Start: 08/25/24 1242     Code Status: Full Code Family Communication: discussed with pt's wife at bedside Disposition Plan: return home Reason for continuing need for hospitalization: awaiting PT consult. Working on Pain control.  Objective: Vitals:   08/25/24 2132 08/26/24 0449 08/26/24 0844 08/26/24 0948  BP: (!) 135/45 (!) 140/41 (!) 138/101 (!) 126/110  Pulse:  68 70 71  Resp:  18 18   Temp:   100.2 F (37.9 C)   TempSrc:   Oral   SpO2:  94% 94%   Weight:      Height:        Intake/Output Summary (Last 24 hours) at 08/26/2024 1132 Last data filed at 08/26/2024 0454 Gross per 24 hour  Intake 46.36 ml  Output --  Net 46.36 ml   Filed Weights   08/25/24 0734  Weight: 94.5 kg    Examination:  Physical Exam Vitals and nursing note reviewed.  Constitutional:      General: He is not in acute distress.    Appearance: He is not toxic-appearing.  HENT:     Head: Normocephalic and atraumatic.  Eyes:     General: No scleral icterus. Cardiovascular:     Rate and Rhythm: Normal rate and regular rhythm.  Pulmonary:     Effort: Pulmonary effort is normal.     Breath sounds: Normal breath sounds.  Abdominal:     General: Bowel sounds are normal. There is no distension.     Palpations: Abdomen is soft.  Musculoskeletal:     Comments: Right lower leg in cam boot. Right foot in ACE bandage.  Skin:    Capillary Refill: Capillary refill takes less than 2 seconds.  Neurological:     Mental Status: He is alert and oriented to person, place, and time.     Data Reviewed: I have personally reviewed following labs and imaging  studies  CBC: Recent Labs  Lab 08/25/24 0836  WBC 9.0  HGB 9.4*  HCT 32.4*  MCV 100.3*  PLT 329   Basic Metabolic Panel: Recent Labs  Lab 08/25/24 0836  NA 140  K 3.7  CL 100  CO2 25  GLUCOSE 81  BUN 21*  CREATININE 7.13*  CALCIUM  7.9*   GFR: Estimated Creatinine Clearance: 13.1 mL/min (A) (by C-G formula based on SCr of 7.13 mg/dL (H)).  Scheduled Meds:  allopurinol  100 mg Oral q AM   amiodarone   200 mg Oral Daily   aspirin  EC  81 mg Oral q AM   atorvastatin   80 mg Oral QHS   [START ON 08/27/2024] calcitRIOL   1 mcg Oral Q M,W,F-HD   docusate sodium   100 mg Oral BID   famotidine   20 mg Oral QHS   ferric citrate   420 mg Oral TID WC   heparin   5,000 Units Subcutaneous Q8H   metoprolol  succinate  12.5 mg Oral Daily   midodrine   15 mg Oral TID WC   pantoprazole   40 mg  Oral Daily   senna  1 tablet Oral BID   tamsulosin   0.4 mg Oral QPM   Continuous Infusions:  ertapenem Stopped (08/25/24 2223)     LOS: 0 days   Time spent: 55 minutes  Camellia Door, DO  [facility]  08/26/2024, 11:32 AM
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
## 2024-08-26 NOTE — Plan of Care (Signed)

## 2024-08-26 NOTE — Care Management Obs Status (Signed)
 MEDICARE OBSERVATION STATUS NOTIFICATION   Patient Details  Name: [name] MRN: [id number] Date of Birth: [birth year]   Medicare Observation Status Notification Given:  Yes    Jayli Fogleman G., RN 08/26/2024, 2:25 PM

## 2024-08-26 NOTE — Subjective & Objective (Signed)
 Pt seen and examined. Very lethargic this AM. HR around 35 with BP. Getting stat EKG, CBC, BMP, VBG.  Communicated with ortho. He needs post-op exam and f/u orders.  **update.  CBG 38. Given 1 amp D50. Increase in HR to 50 bpm. Will transfer to progressive unit.

## 2024-08-27 ENCOUNTER — Other Ambulatory Visit (HOSPITAL_COMMUNITY): Payer: Self-pay

## 2024-08-27 ENCOUNTER — Observation Stay (HOSPITAL_COMMUNITY)

## 2024-08-27 ENCOUNTER — Encounter (HOSPITAL_COMMUNITY): Payer: Self-pay | Admitting: Orthopedic Surgery

## 2024-08-27 DIAGNOSIS — I96 Gangrene, not elsewhere classified: Secondary | ICD-10-CM | POA: Diagnosis not present

## 2024-08-27 DIAGNOSIS — Z1612 Extended spectrum beta lactamase (ESBL) resistance: Secondary | ICD-10-CM | POA: Diagnosis not present

## 2024-08-27 DIAGNOSIS — R001 Bradycardia, unspecified: Secondary | ICD-10-CM | POA: Diagnosis not present

## 2024-08-27 DIAGNOSIS — E162 Hypoglycemia, unspecified: Secondary | ICD-10-CM

## 2024-08-27 DIAGNOSIS — R7881 Bacteremia: Secondary | ICD-10-CM

## 2024-08-27 DIAGNOSIS — I480 Paroxysmal atrial fibrillation: Secondary | ICD-10-CM | POA: Diagnosis not present

## 2024-08-27 DIAGNOSIS — E875 Hyperkalemia: Secondary | ICD-10-CM

## 2024-08-27 DIAGNOSIS — R579 Shock, unspecified: Secondary | ICD-10-CM | POA: Diagnosis not present

## 2024-08-27 DIAGNOSIS — R9431 Abnormal electrocardiogram [ECG] [EKG]: Secondary | ICD-10-CM | POA: Diagnosis not present

## 2024-08-27 DIAGNOSIS — R570 Cardiogenic shock: Secondary | ICD-10-CM | POA: Diagnosis not present

## 2024-08-27 DIAGNOSIS — E8721 Acute metabolic acidosis: Secondary | ICD-10-CM

## 2024-08-27 DIAGNOSIS — I9589 Other hypotension: Secondary | ICD-10-CM | POA: Diagnosis not present

## 2024-08-27 DIAGNOSIS — Z89431 Acquired absence of right foot: Secondary | ICD-10-CM | POA: Diagnosis not present

## 2024-08-27 DIAGNOSIS — R4182 Altered mental status, unspecified: Secondary | ICD-10-CM

## 2024-08-27 LAB — GLUCOSE, CAPILLARY
Glucose-Capillary: 111 mg/dL — ABNORMAL HIGH (ref 70–99)
Glucose-Capillary: 135 mg/dL — ABNORMAL HIGH (ref 70–99)
Glucose-Capillary: 155 mg/dL — ABNORMAL HIGH (ref 70–99)
Glucose-Capillary: 180 mg/dL — ABNORMAL HIGH (ref 70–99)
Glucose-Capillary: 183 mg/dL — ABNORMAL HIGH (ref 70–99)
Glucose-Capillary: 223 mg/dL — ABNORMAL HIGH (ref 70–99)
Glucose-Capillary: 38 mg/dL — CL (ref 70–99)
Glucose-Capillary: 53 mg/dL — ABNORMAL LOW (ref 70–99)

## 2024-08-27 LAB — BASIC METABOLIC PANEL WITH GFR
Anion gap: 30 — ABNORMAL HIGH (ref 5–15)
BUN: 49 mg/dL — ABNORMAL HIGH (ref 6–20)
CO2: 11 mmol/L — ABNORMAL LOW (ref 22–32)
Calcium: 7.4 mg/dL — ABNORMAL LOW (ref 8.9–10.3)
Chloride: 97 mmol/L — ABNORMAL LOW (ref 98–111)
Creatinine, Ser: 12.65 mg/dL — ABNORMAL HIGH (ref 0.61–1.24)
GFR, Estimated: 4 mL/min — ABNORMAL LOW (ref >60)
Glucose, Bld: 46 mg/dL — ABNORMAL LOW (ref 70–99)
Potassium: 5.8 mmol/L — ABNORMAL HIGH (ref 3.5–5.1)
Sodium: 138 mmol/L (ref 135–145)

## 2024-08-27 LAB — BLOOD GAS, VENOUS
Acid-base deficit: 16.3 mmol/L — ABNORMAL HIGH (ref 0.0–2.0)
Bicarbonate: 13.3 mmol/L — ABNORMAL LOW (ref 20.0–28.0)
O2 Saturation: 26.2 %
Patient temperature: 35.6
pCO2, Ven: 42 mmHg — ABNORMAL LOW (ref 44–60)
pH, Ven: 7.1 — CL (ref 7.25–7.43)
pO2, Ven: <31 mmHg — CL (ref 32–45)

## 2024-08-27 LAB — LACTIC ACID, PLASMA
Lactic Acid, Venous: 5.8 mmol/L (ref 0.5–1.9)
Lactic Acid, Venous: 4.2 mmol/L (ref 0.5–1.9)

## 2024-08-27 LAB — ECHOCARDIOGRAM COMPLETE
AR max vel: 2.7 cm2
AV Area VTI: 3.1 cm2
AV Area mean vel: 2.57 cm2
AV Mean grad: 2 mmHg
AV Peak grad: 4 mmHg
Ao pk vel: 1 m/s
Area-P 1/2: 3.17 cm2
Est EF: >75
S' Lateral: 1.8 cm

## 2024-08-27 LAB — COMPREHENSIVE METABOLIC PANEL WITH GFR
ALT: 30 U/L (ref 0–44)
AST: 287 U/L — ABNORMAL HIGH (ref 15–41)
Albumin: 2.6 g/dL — ABNORMAL LOW (ref 3.5–5.0)
Alkaline Phosphatase: 174 U/L — ABNORMAL HIGH (ref 38–126)
Anion gap: 20 — ABNORMAL HIGH (ref 5–15)
BUN: 56 mg/dL — ABNORMAL HIGH (ref 6–20)
CO2: 17 mmol/L — ABNORMAL LOW (ref 22–32)
Calcium: 6.9 mg/dL — ABNORMAL LOW (ref 8.9–10.3)
Chloride: 95 mmol/L — ABNORMAL LOW (ref 98–111)
Creatinine, Ser: 12.28 mg/dL — ABNORMAL HIGH (ref 0.61–1.24)
GFR, Estimated: 4 mL/min — ABNORMAL LOW (ref >60)
Glucose, Bld: 142 mg/dL — ABNORMAL HIGH (ref 70–99)
Potassium: 4.4 mmol/L (ref 3.5–5.1)
Sodium: 132 mmol/L — ABNORMAL LOW (ref 135–145)
Total Bilirubin: 0.8 mg/dL (ref 0.0–1.2)
Total Protein: 6.4 g/dL — ABNORMAL LOW (ref 6.5–8.1)

## 2024-08-27 LAB — TROPONIN I (HIGH SENSITIVITY): Troponin I (High Sensitivity): >24000 ng/L (ref <18)

## 2024-08-27 LAB — CBC WITH DIFFERENTIAL/PLATELET
Abs Immature Granulocytes: 0.32 K/uL — ABNORMAL HIGH (ref 0.00–0.07)
Basophils Absolute: 0.1 K/uL (ref 0.0–0.1)
Basophils Relative: 0 %
Eosinophils Absolute: 0 K/uL (ref 0.0–0.5)
Eosinophils Relative: 0 %
HCT: 31.4 % — ABNORMAL LOW (ref 39.0–52.0)
Hemoglobin: 9 g/dL — ABNORMAL LOW (ref 13.0–17.0)
Immature Granulocytes: 2 %
Lymphocytes Relative: 9 %
Lymphs Abs: 1.5 K/uL (ref 0.7–4.0)
MCH: 29.4 pg (ref 26.0–34.0)
MCHC: 28.7 g/dL — ABNORMAL LOW (ref 30.0–36.0)
MCV: 102.6 fL — ABNORMAL HIGH (ref 80.0–100.0)
Monocytes Absolute: 1.2 K/uL — ABNORMAL HIGH (ref 0.1–1.0)
Monocytes Relative: 7 %
Neutro Abs: 12.7 K/uL — ABNORMAL HIGH (ref 1.7–7.7)
Neutrophils Relative %: 82 %
Platelets: 326 K/uL (ref 150–400)
RBC: 3.06 MIL/uL — ABNORMAL LOW (ref 4.22–5.81)
RDW: 17.2 % — ABNORMAL HIGH (ref 11.5–15.5)
WBC: 15.8 K/uL — ABNORMAL HIGH (ref 4.0–10.5)
nRBC: 0.1 % (ref 0.0–0.2)

## 2024-08-27 LAB — CBC
HCT: 30.4 % — ABNORMAL LOW (ref 39.0–52.0)
Hemoglobin: 9.2 g/dL — ABNORMAL LOW (ref 13.0–17.0)
MCH: 28.7 pg (ref 26.0–34.0)
MCHC: 30.3 g/dL (ref 30.0–36.0)
MCV: 94.7 fL (ref 80.0–100.0)
Platelets: 361 K/uL (ref 150–400)
RBC: 3.21 MIL/uL — ABNORMAL LOW (ref 4.22–5.81)
RDW: 17.1 % — ABNORMAL HIGH (ref 11.5–15.5)
WBC: 14.2 K/uL — ABNORMAL HIGH (ref 4.0–10.5)
nRBC: 0.1 % (ref 0.0–0.2)

## 2024-08-27 LAB — POCT I-STAT 7, (LYTES, BLD GAS, ICA,H+H)
Acid-base deficit: 14 mmol/L — ABNORMAL HIGH (ref 0.0–2.0)
Bicarbonate: 11.7 mmol/L — ABNORMAL LOW (ref 20.0–28.0)
Calcium, Ion: 0.91 mmol/L — ABNORMAL LOW (ref 1.15–1.40)
HCT: 32 % — ABNORMAL LOW (ref 39.0–52.0)
Hemoglobin: 10.9 g/dL — ABNORMAL LOW (ref 13.0–17.0)
O2 Saturation: 96 %
Patient temperature: 98.5
Potassium: 5.1 mmol/L (ref 3.5–5.1)
Sodium: 134 mmol/L — ABNORMAL LOW (ref 135–145)
TCO2: 12 mmol/L — ABNORMAL LOW (ref 22–32)
pCO2 arterial: 26.1 mmHg — ABNORMAL LOW (ref 32–48)
pH, Arterial: 7.26 — ABNORMAL LOW (ref 7.35–7.45)
pO2, Arterial: 95 mmHg (ref 83–108)

## 2024-08-27 LAB — BRAIN NATRIURETIC PEPTIDE: B Natriuretic Peptide: 569.7 pg/mL — ABNORMAL HIGH (ref 0.0–100.0)

## 2024-08-27 LAB — MAGNESIUM: Magnesium: 2 mg/dL (ref 1.7–2.4)

## 2024-08-27 LAB — MRSA NEXT GEN BY PCR, NASAL: MRSA by PCR Next Gen: NOT DETECTED

## 2024-08-27 LAB — PHOSPHORUS: Phosphorus: 5.2 mg/dL — ABNORMAL HIGH (ref 2.5–4.6)

## 2024-08-27 LAB — POTASSIUM
Potassium: 4.5 mmol/L (ref 3.5–5.1)
Potassium: 4.4 mmol/L (ref 3.5–5.1)

## 2024-08-27 MED ORDER — SODIUM BICARBONATE 8.4 % IV SOLN
100.0000 meq | Freq: Once | INTRAVENOUS | Status: AC
  Administered 2024-08-27: 100 meq via INTRAVENOUS
  Filled 2024-08-27: qty 50

## 2024-08-27 MED ORDER — DEXTROSE 50 % IV SOLN
INTRAVENOUS | Status: AC
  Administered 2024-08-27: 50 mL
  Filled 2024-08-27: qty 50

## 2024-08-27 MED ORDER — CALCIUM GLUCONATE-NACL 1-0.675 GM/50ML-% IV SOLN
1.0000 g | Freq: Once | INTRAVENOUS | Status: AC
  Administered 2024-08-27: 1000 mg via INTRAVENOUS
  Filled 2024-08-27: qty 50

## 2024-08-27 MED ORDER — EPINEPHRINE HCL 5 MG/250ML IV SOLN IN NS
0.5000 ug/min | INTRAVENOUS | Status: DC
  Administered 2024-08-27 (×2): 0.5 ug/min via INTRAVENOUS
  Administered 2024-08-28: 11 ug/min via INTRAVENOUS
  Administered 2024-08-29: 12 ug/min via INTRAVENOUS
  Administered 2024-08-29: 6 ug/min via INTRAVENOUS
  Filled 2024-08-27 (×4): qty 250

## 2024-08-27 MED ORDER — SODIUM ZIRCONIUM CYCLOSILICATE 10 G PO PACK
10.0000 g | PACK | Freq: Once | ORAL | Status: AC
  Administered 2024-08-27: 10 g via ORAL
  Filled 2024-08-27: qty 1

## 2024-08-27 MED ORDER — SODIUM CHLORIDE 0.9 % IV SOLN: 250.0000 mL | INTRAVENOUS | Status: AC

## 2024-08-27 MED ORDER — NOREPINEPHRINE 4 MG/250ML-% IV SOLN
0.0000 ug/min | INTRAVENOUS | Status: DC
  Administered 2024-08-27: 2 ug/min via INTRAVENOUS
  Administered 2024-08-28: 14 ug/min via INTRAVENOUS
  Administered 2024-08-28: 7 ug/min via INTRAVENOUS
  Administered 2024-08-29: 20 ug/min via INTRAVENOUS
  Administered 2024-08-29: 19 ug/min via INTRAVENOUS
  Filled 2024-08-27 (×5): qty 250

## 2024-08-27 MED ORDER — DOCUSATE SODIUM 100 MG PO CAPS
100.0000 mg | ORAL_CAPSULE | Freq: Two times a day (BID) | ORAL | 0 refills | Status: AC
  Filled 2024-08-27 – 2024-09-13 (×2): qty 30, 15d supply, fill #0

## 2024-08-27 MED ORDER — NOREPINEPHRINE 4 MG/250ML-% IV SOLN
0.0000 ug/min | INTRAVENOUS | Status: DC
  Administered 2024-08-27: 10 ug/min via INTRAVENOUS
  Filled 2024-08-27: qty 250

## 2024-08-27 MED ORDER — DEXTROSE 50 % IV SOLN: 1.0000 | Freq: Once | INTRAVENOUS | Status: DC

## 2024-08-27 MED ORDER — ATROPINE SULFATE 1 MG/10ML IJ SOSY
1.0000 mg | PREFILLED_SYRINGE | Freq: Once | INTRAMUSCULAR | Status: DC
  Filled 2024-08-27: qty 10

## 2024-08-27 MED ORDER — INSULIN ASPART 100 UNIT/ML IV SOLN: 10.0000 [IU] | Freq: Once | INTRAVENOUS | Status: DC

## 2024-08-27 MED ORDER — ATROPINE SULFATE 1 MG/10ML IJ SOSY
PREFILLED_SYRINGE | INTRAMUSCULAR | Status: AC
  Administered 2024-08-27: 0.5 mg
  Filled 2024-08-27: qty 10

## 2024-08-27 MED ORDER — SODIUM BICARBONATE 8.4 % IV SOLN
INTRAVENOUS | Status: AC
  Filled 2024-08-27: qty 50

## 2024-08-27 MED ORDER — IOHEXOL 350 MG/ML SOLN
75.0000 mL | Freq: Once | INTRAVENOUS | Status: AC | PRN
  Administered 2024-08-27: 75 mL via INTRAVENOUS

## 2024-08-27 MED ORDER — DEXTROSE 10 % IV SOLN: INTRAVENOUS | Status: DC

## 2024-08-27 MED ORDER — INSULIN ASPART 100 UNIT/ML IV SOLN
5.0000 [IU] | Freq: Once | INTRAVENOUS | Status: AC
  Administered 2024-08-27: 5 [IU] via INTRAVENOUS

## 2024-08-27 MED ORDER — SENNA 8.6 MG PO TABS
2.0000 | ORAL_TABLET | Freq: Two times a day (BID) | ORAL | 0 refills | Status: AC
  Filled 2024-08-27 – 2024-09-13 (×2): qty 30, 8d supply, fill #0

## 2024-08-27 MED ORDER — DEXTROSE 50 % IV SOLN
1.0000 | Freq: Once | INTRAVENOUS | Status: AC
  Administered 2024-08-27: 50 mL via INTRAVENOUS
  Filled 2024-08-27: qty 50

## 2024-08-27 MED ORDER — CHLORHEXIDINE GLUCONATE CLOTH 2 % EX PADS
6.0000 | MEDICATED_PAD | Freq: Every day | CUTANEOUS | Status: DC
  Administered 2024-08-28 (×2): 6 via TOPICAL

## 2024-08-27 MED ORDER — OXYCODONE HCL 5 MG PO TABS
5.0000 mg | ORAL_TABLET | Freq: Four times a day (QID) | ORAL | 0 refills | Status: AC | PRN
  Filled 2024-08-27 – 2024-09-13 (×2): qty 12, 3d supply, fill #0

## 2024-08-27 MED ORDER — SODIUM CHLORIDE 0.9 % IV BOLUS
500.0000 mL | Freq: Once | INTRAVENOUS | Status: AC
  Administered 2024-08-27: 500 mL via INTRAVENOUS

## 2024-08-27 MED ORDER — OXIDIZED CELLULOSE EX PADS
1.0000 | MEDICATED_PAD | Freq: Once | CUTANEOUS | Status: DC
  Filled 2024-08-27: qty 1

## 2024-08-27 MED ORDER — ATROPINE SULFATE 1 MG/10ML IJ SOSY
PREFILLED_SYRINGE | INTRAMUSCULAR | Status: AC
  Filled 2024-08-27: qty 10

## 2024-08-27 MED ORDER — OXIDIZED CELLULOSE EX PADS
1.0000 | MEDICATED_PAD | Freq: Once | CUTANEOUS | Status: AC
  Filled 2024-08-27: qty 1

## 2024-08-27 NOTE — Hospital Course (Signed)
 Dr. Johnston problem based presentation in this order  Neuro   Respiratory Respiratory/Airway status/Gas/ SBT? / laryngeal edema  Cardiac Cardiac/Hemodynamic stability/ last TTE  GI GI  Nutrution PPI only for shock or mechanical ventilation  OR GI issues  ID ID work up and lines and dates Lines:  Endo   Renal Metabolic/ AKI/Lytes/Lactic acid  Heme WBC/CBC/Platelets  Anticoagulation: Reason:  MSK/Other

## 2024-08-27 NOTE — Progress Notes (Signed)
   Persistent bradycardia. S/p 0.5 mg IV atropine. D50 x 2. Starting IV D10. Will transfer to ICU. May need short term dopamine gtts.  Camellia Door, DO [facility]

## 2024-08-27 NOTE — TOC Initial Note (Signed)
 Transition of Care [facility]) - Initial/Assessment Note    Patient Details  Name: [name] MRN: [id number] Date of Birth: 06-07-1966  Transition of Care [facility]) CM/SW Contact:    Lauraine FORBES Saa, LCSWA Phone Number: 08/27/2024, 12:33 PM  Clinical Narrative:                  12:34 PM Per chart review, patient resides at home with spouse and minor children. Patient has a PCP and insurance. Patient does not have SNF history. Patient has HH history with [facility], [facility], and [facility]. Patient has DME (CPAP, RW) history with [facility]. Physical therapy did not have recommendations for patient. No TOC needs identified at this time. TOC will continue to follow.  Expected Discharge Plan: Home/Self Care Barriers to Discharge: Continued Medical Work up   Patient Goals and CMS Choice            Expected Discharge Plan and Services       Living arrangements for the past 2 months: Single Family Home                                      Prior Living Arrangements/Services Living arrangements for the past 2 months: Single Family Home Lives with:: Spouse, Minor Children Patient language and need for interpreter reviewed:: Yes            Current home services: DME Criminal Activity/Legal Involvement Pertinent to Current Situation/Hospitalization: No - Comment as needed  Activities of Daily Living   ADL Screening (condition at time of admission) Independently performs ADLs?: Yes (appropriate for developmental age) Is the patient deaf or have difficulty hearing?: No Does the patient have difficulty seeing, even when wearing glasses/contacts?: No Does the patient have difficulty concentrating, remembering, or making decisions?: No  Permission Sought/Granted Permission sought to share information with : Family Supports Permission granted to share information with : No (Contact information on chart)  Share Information with NAME: [name]     Permission granted to share  info w Relationship: Spouse  Permission granted to share info w Contact Information: [phone]  Emotional Assessment       Orientation: : Oriented to Place, Oriented to Self, Oriented to  Time, Oriented to Situation Alcohol  / Substance Use: Not Applicable Psych Involvement: No (comment)  Admission diagnosis:  Gangrene (HCC) [I96] Gangrene of right foot (HCC) [I96] S/P transmetatarsal amputation of foot, right (HCC) [Z89.431] Patient Active Problem List   Diagnosis Date Noted   Altered mental status 08/27/2024   Hypoglycemia 08/27/2024   Bradycardia 08/27/2024   Gangrene of right foot (HCC) 08/25/2024   S/P transmetatarsal amputation of foot, right (HCC) 08/25/2024   Infection due to ESBL-producing Escherichia coli 08/25/2024   Persistent atrial fibrillation (HCC) 01/18/2023   Arterial insufficiency with ischemic ulcer (HCC) 01/17/2023   History of DVT (deep vein thrombosis) 01/10/2023   Chronic diastolic CHF (congestive heart failure) (HCC) 08/28/2022   Chronic asymptomatic hypotension 08/27/2022   Mixed hyperlipidemia 08/27/2022   OSA on CPAP 08/27/2022   Vitamin D  deficiency 08/27/2022   BPH (benign prostatic hyperplasia) 08/27/2022   Presence of Watchman left atrial appendage closure device - placed at [facility] 09/10/2021   Transfusion-dependent anemia    AVM (arteriovenous malformation) of duodenum, acquired with hemorrhage    Colon cancer screening 09/03/2020   GERD (gastroesophageal reflux disease) 09/03/2020   Swelling of calf    Paroxysmal atrial fibrillation (  [facility]) - s/p watchman device 11/15/2016   ESRD on dialysis [facility]) - on M, W, F 11/15/2016   PCP:  Maree Virgilio SAUNDERS, MD Pharmacy:   [facility] [place], [place] - [address] [place] [address] Phone: [phone] Fax: [phone]  [facility] [phone] [place], [place] - [address] [place] [address] Phone:  [phone] Fax: [phone]  [facility] [address] [place] [address] Phone: [phone] Fax: [phone]     Social Drivers of Health (SDOH) Social History: SDOH Screenings   Food Insecurity: No Food Insecurity (08/25/2024)  Housing: Low Risk  (08/25/2024)  Transportation Needs: No Transportation Needs (08/25/2024)  Utilities: Not At Risk (08/25/2024)  Depression (PHQ2-9): Low Risk  (08/25/2023)  Tobacco Use: Low Risk  (08/25/2024)   SDOH Interventions:     Readmission Risk Interventions    08/17/2024    3:30 PM 08/17/2024   10:25 AM 01/25/2023   11:36 AM  Readmission Risk Prevention Plan  Transportation Screening Complete Complete Complete  HRI or Home Care Consult Complete Complete   Social Work Consult for Recovery Care Planning/Counseling Complete Complete   Palliative Care Screening Not Applicable Not Applicable   Medication Review Oceanographer) Complete Complete Complete  PCP or Specialist appointment within 3-5 days of discharge   Complete  HRI or Home Care Consult   Complete  SW Recovery Care/Counseling Consult   Complete  Palliative Care Screening   Not Applicable  Skilled Nursing Facility   Not Applicable

## 2024-08-27 NOTE — Progress Notes (Signed)
 [name] was seen prior by cardiology. However, I was contacted as his recent troponin returned >24,000.   EKG with junctional rhythm at 65 bpm, RVH, and prolonged QTC   Repeat troponin is unable to processed by the lab given initial assay was above assay.   Given elevated troponin, his known prior CAD ( last assessed via angiogram in 2022), and his hemodynamic instability I would recommend the initiation of anticoagulation for NSTEMI.   Plan:  - ICU team to discuss with surgeons regarding the initiation of ASA 324 mg and heparin  gtt (no bolus)  - Please r4eplete calcium  with Ca of 6.9 which inreasese QTC prolognation Hypocalecmia *** - Please give 2 gram of IV magnesium   - Remove all QTC prolonging agents given QTC >600 ms    - Discontinue Zofran , oxycodone     - Discuss with pharmacy to ensure no other QTC prolonging agents on [facility]

## 2024-08-27 NOTE — Plan of Care (Signed)

## 2024-08-27 NOTE — Progress Notes (Signed)
 [facility] Progress Note  Subjective:  No c/o's today A bit woozy after IV pain meds this am, RN aware  Vitals:   08/26/24 2218 08/27/24 0024 08/27/24 0628 08/27/24 0724  BP:  119/85 115/74 94/76  Pulse:  92 81 99  Resp:   16 18  Temp: 98 F (36.7 C) 97.6 F (36.4 C) 98.9 F (37.2 C) 98.6 F (37 C)  TempSrc:   Oral   SpO2:  95% 99% 96%  Weight:      Height:        Exam: General: a bit lethargic after IV pain meds at 6am, BP soft / stable Neck: JVD not elevated. Lungs: Clear bilaterally to auscultation  Heart: RRR with normal S1, S2. No RG.  Abdomen: Soft, non-tender, non-distended  Lower extremities: No edema; R foot in hard boot Neuro: Alert and oriented X 3. 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 Recent bacteremia. Per ID, should continue Erta thru 10/14.  ESRD: on HD MWF. Next HD today.   Hypotension/volume: BP decent, no LE edema. BP typically low during HD per pt.   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 Secondary hyperparathyroidism: Ca ok. Cont home meds  CAD  A-fib  [name]  CK[facility] 08/27/2024, 9:14 AM  Recent Labs  Lab 08/25/24 0836  HGB 9.4*  CALCIUM  7.9*  CREATININE 7.13*  K 3.7   No results for input(s): IRON , TIBC, FERRITIN in the last 168 hours. Inpatient medications:  allopurinol  100 mg Oral q AM   amiodarone   200 mg Oral Daily   aspirin  EC  81 mg Oral q AM   atorvastatin   80 mg Oral QHS   calcitRIOL   1 mcg Oral Q M,W,F-HD   docusate sodium   100 mg Oral BID   famotidine   20 mg Oral QHS   ferric citrate   420 mg Oral TID WC   heparin   5,000 Units Subcutaneous Q8H   metoprolol  succinate  12.5 mg Oral Daily   midodrine   15 mg Oral TID WC   pantoprazole   40  mg Oral Daily   senna  1 tablet Oral BID   tamsulosin   0.4 mg Oral QPM    ertapenem 500 mg (08/26/24 2113)   acetaminophen , bisacodyl, HYDROmorphone  (DILAUDID ) injection, ondansetron  **OR** ondansetron  (ZOFRAN ) IV, ondansetron  **OR** ondansetron  (ZOFRAN ) IV, oxyCODONE  **OR** oxyCODONE , polyethylene glycol
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
## 2024-08-27 NOTE — Procedures (Signed)
 Central Venous Catheter Insertion Procedure Note  [name]  [id number]  20-Mar-1966  Date:08/27/24  Time:3:38 PM   Provider Performing:Indyah Saulnier H Houda Brau   Procedure: Insertion of Non-tunneled Central Venous [phone]) with US  guidance ([id number])   Indication(s) Medication administration  Consent Risks of the procedure as well as the alternatives and risks of each were explained to the patient and/or caregiver.  Consent for the procedure was obtained and is signed in the bedside chart  Anesthesia Topical only with 1% lidocaine    Timeout Verified patient identification, verified procedure, site/side was marked, verified correct patient position, special equipment/implants available, medications/allergies/relevant history reviewed, required imaging and test results available.  Sterile Technique Maximal sterile technique including full sterile barrier drape, hand hygiene, sterile gown, sterile gloves, mask, hair covering, sterile ultrasound probe cover (if used).  Procedure Description Area of catheter insertion was cleaned with chlorhexidine  and draped in sterile fashion.  With real-time ultrasound guidance a central venous catheter was placed into the right femoral vein. Nonpulsatile blood flow and easy flushing noted in all ports.  The catheter was sutured in place and sterile dressing applied.  Complications/Tolerance None; patient tolerated the procedure well. Chest x-ray is not ordered for femoral cannulation.  EBL Minimal  Specimen(s) None  Warren Shade, DNP, AGACNP-BC  Pulmonary & Critical Care  Please see [email or web address] for pager details.  From 7A-7P if no response, please call [phone]. After hours, please call ELink [phone].

## 2024-08-27 NOTE — Progress Notes (Addendum)
 eLink Physician-Brief Progress Note Patient Name: [name] DOB: 1965-11-19 MRN: [id number]   Date of Service  08/27/2024  HPI/Events of Note  Notified of elevated lactic acid at 5.8. Pt is on epinephrine  and norepinephrine  gtts.  Glucoses trending up but most recent POC glucose check is at 111.  eICU Interventions  Continue to trend lactate. Plans in place for HD tonight. Keep current rate of D10 at 50cc/hr.          Shanda Busman 08/27/2024, 7:29 PM  8:23 PM Notified of troponin >24,000.   Plan> Cardiology on call notified.   9:06 PM Cardiology has recommended to start heparin  gtt.  Ortho Dr. Elsa was notified and is ok with starting heparin  gtt.  Pt had metatarsal amputation on 08/25/24.   Plan> Start on heparin  gtt as per protocol.

## 2024-08-27 NOTE — Procedures (Signed)
 Arterial Catheter Insertion Procedure Note  Lashaun Krapf  [id number]  [birth year]  Date:08/27/24  Time:12:30 PM    Provider Performing: Warren DEL Rukaya Kleinschmidt    Procedure: Insertion of Arterial Line (63379) with US  guidance (23062)   Indication(s) Blood pressure monitoring and/or need for frequent ABGs  Consent Risks of the procedure as well as the alternatives and risks of each were explained to the patient and/or caregiver.  Consent for the procedure was obtained and is signed in the bedside chart  Anesthesia Local anesthetic: 3 ml Lidocaine  1% administered   Time Out Verified patient identification, verified procedure, site/side was marked, verified correct patient position, special equipment/implants available, medications/allergies/relevant history reviewed, required imaging and test results available.   Sterile Technique Maximal sterile technique including full sterile barrier drape, hand hygiene, sterile gown, sterile gloves, mask, hair covering, sterile ultrasound probe cover (if used).   Procedure Description Area of catheter insertion was cleaned with chlorhexidine  and draped in sterile fashion. With real-time ultrasound guidance an arterial catheter was placed into the right femoral artery.  Appropriate arterial tracings confirmed on monitor.     Complications/Tolerance None; patient tolerated the procedure well.   EBL Minimal   Specimen(s) None  Wife updated at bedside.  Elven Laboy, DNP, AGACNP-BC [facility]  Please see [email or web address] for pager details.  From 7A-7P if no response, please call [phone]. After hours, please call ELink [phone].
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
## 2024-08-27 NOTE — Progress Notes (Signed)
 PROGRESS NOTE    [name]  FMW:[id number] DOB: 1966/06/28 DOA: 08/25/2024 PCP: Maree Virgilio SAUNDERS, MD  Subjective: Pt seen and examined. Very lethargic this AM. HR around 35 with BP. Getting stat EKG, CBC, BMP, VBG.  Communicated with ortho. He needs post-op exam and f/u orders.  **update.  CBG 38. Given 1 amp D50. Increase in HR to 50 bpm. Will transfer to progressive unit.   Hospital Course: CC: gangrene right foot, s/p right transmetatarsal amputation HPI: 59 year old African-American male history of end-stage renal disease on dialysis Monday Wednesday Friday, chronic hypotension with normal systolic blood pressures in the 70s 80s and 90s, history of small bowel AVMs, history of DVT no longer on anticoagulation, history of paroxysmal atrial fibrillation status post Watchman device no longer requiring systemic anticoagulation who was recently discharged from [facility] on 08/17/[facility]24 with with a discharge diagnosis of ESBL E. coli septicemia from presumed osteomyelitis.  He was discharged to home with IV Invanz via midline in his right upper extremity.  He was supposed to complete 2 weeks of Invanz with the end date of 08/28/2024.  He presented to the orthopedic clinic yesterday and was noted to have gangrene of the right foot.  He was taken the operating room today by Dr. Kit where he had a transmetatarsal amputation performed.  [facility] hospital[facility] consulted for admission postoperatively.  Patient states that his blood pressure normally runs low with systolics ranging from 70-90 on a regular basis.  He is asymptomatic.  Significant Events: Admitted 08/25/2024 for s/p right transmetatarsal amputation   Admission Labs: WBC 9.0, HgB 9.4, plt 329 Na 140, K 3.7, CO2 of 26, BUN 21, Scr 7.13, glu 81  Admission Imaging Studies:   Significant Labs:   Significant Imaging Studies:   Antibiotic Therapy: Anti-infectives (From admission, onward)    Start     Dose/Rate Route  Frequency Ordered Stop   08/25/24 0745  ceFAZolin  (ANCEF ) IVPB 2g/100 mL premix        2 g 200 mL/hr over 30 Minutes Intravenous On call to O.R. 08/25/24 0737 08/26/24 0559       Procedures: Right forefoot transmetatarsal amputation  Consultants: Orthopedics ID nephrology    Assessment and Plan: * S/P transmetatarsal amputation of foot, right (HCC) 08/25/24 orthopedics to manage and arrange for post-op followup.  08/26/24 change pain meds to oxycodone  5 - 10 mg q4h prn moderate/severe pain, IV dilaudid  1 mg q2h prn for breakthrough pain. Pt needs PT consult.  08/27/24 awaiting ortho f/u note for discharge instructions. Stop IV dilaudid .   Bradycardia 08/27/24 associated with hypoglycemia. EKG shows idioventricular rhythm. HR about 35 bpm associated with hypoglycemia and AMS. Transfer him to progressive unit. Called and update his wife.   Hypoglycemia 08/27/24 CBG 35 this AM. Associated with bradycardia.   Altered mental status 08/27/24 very lethargic this AM. Will wake up to gentle physical stimulation and to voice. Check stat EKG, VBG, CBC, BMP. Pt last dose of po oxycodone  10 mg at 0040. Last dose of IV dilaudid  was yesterday at 12:40 pm  **update. AMS due to hypoglycemia and bradycardia. Move to progressive care.   Infection due to ESBL-producing Escherichia coli 08/25/24 continue IV Invanz for now. He was suppose to remain on IV invanz therapy total of 2 weeks 08/14/24 - to -08/28/24. Defer to ID if this will change.  08/26/24 ID rec completing IV invanz for his full 14 course. Source control obtained. End date of therapy 08-28-2024.  08/27/24 s/p right transmet amputation.  Will complete IV invanz on 08-28-2024. ID recommends no further abx.    Gangrene of right foot (HCC) 08/25/24 s/p right transmetatarsal amputation. Ortho is consulting ID for abx recs.  08/26/24 resolved with transmetatarsal amputation. ID rec completing IV invanz for his full 14 course.  Source control obtained.  08/27/24 s/p right transmet amputation. Will complete IV invanz on 08-28-2024. ID recommends no further abx.   Chronic asymptomatic hypotension 08/25/24 chronic. Normal SBP 70-90 mm Hg. Pt is asymptomatic.continue tid midodrine .  08/26/24 stable. BP elevated today due to pain.  08/27/24 continue midodrine .    Presence of Watchman left atrial appendage closure device - placed at [facility] 08/25/24 chronic. He does not require systemic anticoagulation for his PAF.  08/26/24 chronic. Stable.  08/27/24 chronic.   Chronic diastolic CHF (congestive heart failure) (HCC) 08/25/24 euvolemic. Volume status managed by dialysis  08/26/24 euvolemic. Stable.  08/27/24 euvolemic.   BPH (benign prostatic hyperplasia) 08/25/24 continue flomax .  08/26/24 stable.  08/27/24 stable.   OSA on CPAP 08/25/24 stable. Prn cpap.  08/26/24 stable.  08/27/24 stable.   Mixed hyperlipidemia 08/25/24 continue lipitor   08/26/24 stable.  08/27/24 stable.  ESRD on dialysis [facility]) - on M, W, F 08/25/24 nephrology consulted for routine HD  08/26/24 will need HD tomorrow.  08/27/24 nephrology consulted for HD.   Paroxysmal atrial fibrillation (HCC) - s/p watchman device 08/25/24 stable. On amiodarone  and Toprol -XL. On ASA. Not on systemic anticoagulation due to hx of small bowel AVMs and hx of GI bleeing. S/p watchman device  08/26/24 stable.  08/27/24 due to bradycardia. Will stop toprol -xl and amiodarone  for now.   DVT prophylaxis: heparin  injection 5,000 Units Start: 08/26/24 0600 SCDs Start: 08/25/24 1242 SCDs Start: 08/25/24 1242     Code Status: Full Code Family Communication: called pt's wife [place]. Disposition Plan: home Reason for continuing need for hospitalization: transfer to progressive care due to hypoglycemia and bradycardia.  Objective: Vitals:   08/27/24 0024 08/27/24 0628 08/27/24 0724 08/27/24 1010  BP:  119/85 115/74 94/76 120/60  Pulse: 92 81 99   Resp:  16 18 16   Temp: 97.6 F (36.4 C) 98.9 F (37.2 C) 98.6 F (37 C)   TempSrc:  Oral    SpO2: 95% 99% 96%   Weight:      Height:       No intake or output data in the 24 hours ending 08/27/24 1025 Filed Weights   08/25/24 0734  Weight: 94.5 kg    Examination:  Physical Exam Vitals and nursing note reviewed.  Constitutional:      Comments: Altered. Will wake up with verbal stimulation but falls asleep again  HENT:     Head: Normocephalic and atraumatic.  Cardiovascular:     Rate and Rhythm: Regular rhythm. Bradycardia present.  Pulmonary:     Effort: Pulmonary effort is normal. No respiratory distress.     Breath sounds: Normal breath sounds. No wheezing.  Abdominal:     General: Bowel sounds are normal.     Palpations: Abdomen is soft.  Musculoskeletal:     Comments: Right foot/leg in cam boot  Neurological:     Comments: Altered. Wakes up to voice and gentle stimulation. Falls asleep quickly.    Data Reviewed: I have personally reviewed following labs and imaging studies  CBC: Recent Labs  Lab 08/25/24 0836  WBC 9.0  HGB 9.4*  HCT 32.4*  MCV 100.3*  PLT 329   Basic Metabolic Panel: Recent Labs  Lab 08/25/24  0836  NA 140  K 3.7  CL 100  CO2 25  GLUCOSE 81  BUN 21*  CREATININE 7.13*  CALCIUM  7.9*   GFR: Estimated Creatinine Clearance: 13.1 mL/min (A) (by C-G formula based on SCr of 7.13 mg/dL (H)). CBG: Recent Labs  Lab 08/27/24 1004  GLUCAP 38*    Scheduled Meds:  allopurinol  100 mg Oral q AM   aspirin  EC  81 mg Oral q AM   atorvastatin   80 mg Oral QHS   calcitRIOL   1 mcg Oral Q M,W,F-HD   dextrose        docusate sodium   100 mg Oral BID   famotidine   20 mg Oral QHS   ferric citrate   420 mg Oral TID WC   heparin   5,000 Units Subcutaneous Q8H   midodrine   15 mg Oral TID WC   pantoprazole   40 mg Oral Daily   senna  1 tablet Oral BID   tamsulosin   0.4 mg Oral QPM   Continuous  Infusions:  ertapenem 500 mg (08/26/24 2113)     LOS: 0 days   Time spent: 60 minutes  Camellia Door, DO  [facility]  08/27/2024, 10:25 AM

## 2024-08-27 NOTE — Significant Event (Signed)
 Rapid Response Event Note   Reason for Call :  Lethargic, HR 30s, MD at bedside, about to give atropine Asked to get CBG and BP   Initial Focused Assessment:  Arrived to bedside, pt lethargic as he arouses to voice/light stimulation and easily drifts back. With stimulation, A&O. Denies dizziness, lightheadedness, CP or palpitations. Skin warm and dry. RLE in boot from transmetatarsal sx 2days ago. Lungs clear bilaterally.   SBP 120 initially (d/t stimulation?), SBP remained 60s or below HR 40s RR 14-20 O2 95% RA CBG 38  D50 amp given upon arrival for initial CBG 38. CBG recheck 53, 2nd amp given. Recheck 223. Mentation remains unchanged.  HR high 40s upon my arrival, slowly dipped and sustained mid 30s--1/2amp atropine given, HR sustaining 55-60.  SBP sustaining 60s. Per MAR, 15mg  midodrine  given 0815; no pain meds since 0045.   Interventions/Plan of Care:  Labs already sent upon my arrival: BMP, CBC, VBG  D50 amp x2  Atropine 1/2 amp  D10 gtt started Tele monitor placed -monitored bedside prior 500ml NS bolus CCM consult, to bedside Tx ICU 2M04  Event Summary:  MD Notified: CHARLENA Door DO, F. Alghanim MD Call Time: 9040 Arrival Time: 1001 End Time: 1145  Tonna Chiquita POUR, RN

## 2024-08-27 NOTE — Discharge Instructions (Signed)
 [name] Armor, MD [facility]  Please read the following information regarding your care after surgery.  Medications  You only need a prescription for the narcotic pain medicine (ex. oxycodone , Percocet, Norco).  All of the other medicines listed below are available over the counter. ? acetominophen (Tylenol ) 650 mg every 4-6 hours as you need for minor to moderate pain ? oxycodone  as prescribed for severe pain  Narcotic pain medicine (ex. oxycodone , Percocet, Vicodin) will cause constipation.  To prevent this problem, take the following medicines while you are taking any pain medicine. ? docusate sodium  (Colace) 100 mg twice a day ? senna (Senokot) 2 tablets twice a day  Weight Bearing ? Bear weight only on your operated foot in the CAM boot.   Cast / Splint / Dressing ? Keep your splint, cast or dressing clean and dry.  Don't put anything (coat hanger, pencil, etc) down inside of it.  If it gets damp, use a hair dryer on the cool setting to dry it.  If it gets soaked, call the office to schedule an appointment for a cast change.   After your dressing, cast or splint is removed; you may shower, but do not soak or scrub the wound.  Allow the water  to run over it, and then gently pat it dry.  Swelling It is normal for you to have swelling where you had surgery.  To reduce swelling and pain, keep your toes above your nose for at least 3 days after surgery.  It may be necessary to keep your foot or leg elevated for several weeks.  If it hurts, it should be elevated.  Follow Up Call my office at [phone] when you are discharged from the hospital or surgery center to schedule an appointment to be seen two weeks after surgery.  Call my office at [phone] if you develop a fever >101.5 F, nausea, vomiting, bleeding from the surgical site or severe pain.

## 2024-08-27 NOTE — Assessment & Plan Note (Signed)
 08/27/24 associated with hypoglycemia. EKG shows idioventricular rhythm. HR about 35 bpm associated with hypoglycemia and AMS. Transfer him to progressive unit. Called and update his wife.

## 2024-08-27 NOTE — Assessment & Plan Note (Signed)
 08/27/24 CBG 35 this AM. Associated with bradycardia.

## 2024-08-27 NOTE — Progress Notes (Signed)
 Pt seen in ICU. On relatively low dose of levo and epi gtt's now. Pt is alert and interact, no resp issues. K+ was 5.8 earlier, repeat mid 4's after temporizing. Plan is for HD tonight in ICU w/ mild pressor support as needed.   [name] Fret  MD  [facility] 08/27/2024, 4:39 PM  Recent Labs  Lab 08/25/24 0836 08/27/24 0956 08/27/24 1303 08/27/24 1354  HGB 9.4* 9.0* 10.9*  --   CALCIUM  7.9* 7.4*  --   --   CREATININE 7.13* 12.65*  --   --   K 3.7 5.8* 5.1 4.5

## 2024-08-27 NOTE — Assessment & Plan Note (Addendum)
 08/27/24 very lethargic this AM. Will wake up to gentle physical stimulation and to voice. Check stat EKG, VBG, CBC, BMP. Pt last dose of po oxycodone  10 mg at 0040. Last dose of IV dilaudid  was yesterday at 12:40 pm  **update. AMS due to hypoglycemia and bradycardia. Move to progressive care.

## 2024-08-27 NOTE — Progress Notes (Signed)
 Subjective: 2 Days Post-Op Procedure(s) (LRB): AMPUTATION, FOOT, TRANSMETATARSAL (Right) LENGTHENING, TENDON, ACHILLES (Right)  Patient reports pain as mild to moderate.  Hospitalist present as patient is bradycardic and hypotensive.  Denies fever, chills, N/V, CP, SOB.  Tolerating POs.  Admits to flatus.  Objective:   VITALS:  Temp:  [97.6 F (36.4 C)-100.9 F (38.3 C)] 98.6 F (37 C) (10/13 0724) Pulse Rate:  [37-157] 72 (10/13 1111) Resp:  [16-18] 16 (10/13 1010) BP: (54-124)/(25-85) 65/40 (10/13 1111) SpO2:  [75 %-100 %] 88 % (10/13 1111)  General: WDWN patient in NAD. Psych:  Appropriate mood and affect. Neuro:  A&O x 3, Moving all extremities, sensation intact to light touch HEENT:  EOMs intact Chest:  Even non-labored respirations Skin:  Dressing C/D/I, no rashes or lesions Extremities: warm/dry, no visible edema, erythema or echymosis.  No lymphadenopathy. Pulses: Popliteus 2+ MSK:  Ankle ROM: DF to neutral , MMT: able to perform quad set   LABS Recent Labs    08/25/24 0836 08/27/24 0956  HGB 9.4* 9.0*  WBC 9.0 15.8*  PLT 329 326   Recent Labs    08/25/24 0836  NA 140  K 3.7  CL 100  CO2 25  BUN 21*  CREATININE 7.13*  GLUCOSE 81   No results for input(s): LABPT, INR in the last 72 hours.   Assessment/Plan: 2 Days Post-Op Procedure(s) (LRB): AMPUTATION, FOOT, TRANSMETATARSAL (Right) LENGTHENING, TENDON, ACHILLES (Right)  The patient's dressing is dry.  No evidence that bleeding that would potential be a source of patient's hypotension. WBAT CAM ABX per ID Plan for 2 week outpatient post-op visit. D/C Rx for oxycodone  sent to patient's outpatient pharmacy after searching PDMP.  Eva Barrack PA-C [facility] Office:  [phone]

## 2024-08-27 NOTE — Progress Notes (Signed)
 Pt receives out-pt HD at [facility], MWF, 0615am chair time. Contacted clinic. Will continue to assist as needed.   [facility] [place] Markey Dialysis Navigator [phone] [facility] #[phone]

## 2024-08-28 ENCOUNTER — Inpatient Hospital Stay (HOSPITAL_COMMUNITY)

## 2024-08-28 ENCOUNTER — Encounter (HOSPITAL_COMMUNITY)
Admission: AD | Disposition: A | Discharge status: Home / Self Care | Payer: Self-pay | Source: Home / Self Care | Attending: Internal Medicine

## 2024-08-28 DIAGNOSIS — I132 Hypertensive heart and chronic kidney disease with heart failure and with stage 5 chronic kidney disease, or end stage renal disease: Secondary | ICD-10-CM | POA: Diagnosis present

## 2024-08-28 DIAGNOSIS — I5082 Biventricular heart failure: Secondary | ICD-10-CM | POA: Diagnosis present

## 2024-08-28 DIAGNOSIS — R579 Shock, unspecified: Secondary | ICD-10-CM | POA: Diagnosis not present

## 2024-08-28 DIAGNOSIS — Y830 Surgical operation with transplant of whole organ as the cause of abnormal reaction of the patient, or of later complication, without mention of misadventure at the time of the procedure: Secondary | ICD-10-CM | POA: Diagnosis present

## 2024-08-28 DIAGNOSIS — N2581 Secondary hyperparathyroidism of renal origin: Secondary | ICD-10-CM | POA: Diagnosis present

## 2024-08-28 DIAGNOSIS — Z992 Dependence on renal dialysis: Secondary | ICD-10-CM | POA: Diagnosis not present

## 2024-08-28 DIAGNOSIS — I50810 Right heart failure, unspecified: Secondary | ICD-10-CM

## 2024-08-28 DIAGNOSIS — I480 Paroxysmal atrial fibrillation: Secondary | ICD-10-CM | POA: Diagnosis present

## 2024-08-28 DIAGNOSIS — I2510 Atherosclerotic heart disease of native coronary artery without angina pectoris: Secondary | ICD-10-CM | POA: Diagnosis not present

## 2024-08-28 DIAGNOSIS — N186 End stage renal disease: Secondary | ICD-10-CM | POA: Diagnosis present

## 2024-08-28 DIAGNOSIS — I4721 Torsades de pointes: Secondary | ICD-10-CM | POA: Diagnosis not present

## 2024-08-28 DIAGNOSIS — I96 Gangrene, not elsewhere classified: Secondary | ICD-10-CM | POA: Diagnosis present

## 2024-08-28 DIAGNOSIS — E43 Unspecified severe protein-calorie malnutrition: Secondary | ICD-10-CM | POA: Diagnosis present

## 2024-08-28 DIAGNOSIS — D631 Anemia in chronic kidney disease: Secondary | ICD-10-CM | POA: Diagnosis present

## 2024-08-28 DIAGNOSIS — I5032 Chronic diastolic (congestive) heart failure: Secondary | ICD-10-CM | POA: Diagnosis present

## 2024-08-28 DIAGNOSIS — E871 Hypo-osmolality and hyponatremia: Secondary | ICD-10-CM | POA: Diagnosis present

## 2024-08-28 DIAGNOSIS — I2781 Cor pulmonale (chronic): Secondary | ICD-10-CM | POA: Diagnosis present

## 2024-08-28 DIAGNOSIS — G9341 Metabolic encephalopathy: Secondary | ICD-10-CM | POA: Diagnosis present

## 2024-08-28 DIAGNOSIS — I272 Pulmonary hypertension, unspecified: Secondary | ICD-10-CM

## 2024-08-28 DIAGNOSIS — I214 Non-ST elevation (NSTEMI) myocardial infarction: Principal | ICD-10-CM

## 2024-08-28 DIAGNOSIS — R570 Cardiogenic shock: Secondary | ICD-10-CM | POA: Diagnosis not present

## 2024-08-28 DIAGNOSIS — I2721 Secondary pulmonary arterial hypertension: Secondary | ICD-10-CM | POA: Diagnosis present

## 2024-08-28 DIAGNOSIS — Y831 Surgical operation with implant of artificial internal device as the cause of abnormal reaction of the patient, or of later complication, without mention of misadventure at the time of the procedure: Secondary | ICD-10-CM | POA: Diagnosis not present

## 2024-08-28 DIAGNOSIS — G839 Paralytic syndrome, unspecified: Secondary | ICD-10-CM | POA: Diagnosis present

## 2024-08-28 DIAGNOSIS — Z1612 Extended spectrum beta lactamase (ESBL) resistance: Secondary | ICD-10-CM | POA: Diagnosis present

## 2024-08-28 DIAGNOSIS — I9589 Other hypotension: Secondary | ICD-10-CM | POA: Diagnosis not present

## 2024-08-28 DIAGNOSIS — T8612 Kidney transplant failure: Secondary | ICD-10-CM | POA: Diagnosis present

## 2024-08-28 DIAGNOSIS — K7460 Unspecified cirrhosis of liver: Secondary | ICD-10-CM

## 2024-08-28 DIAGNOSIS — T82855A Stenosis of coronary artery stent, initial encounter: Secondary | ICD-10-CM | POA: Diagnosis not present

## 2024-08-28 DIAGNOSIS — M869 Osteomyelitis, unspecified: Secondary | ICD-10-CM | POA: Diagnosis present

## 2024-08-28 DIAGNOSIS — E872 Acidosis, unspecified: Secondary | ICD-10-CM | POA: Diagnosis present

## 2024-08-28 HISTORY — PX: CORONARY STENT INTERVENTION: CATH118234

## 2024-08-28 HISTORY — PX: RIGHT/LEFT HEART CATH AND CORONARY ANGIOGRAPHY: CATH118266

## 2024-08-28 LAB — HEPATITIS B SURFACE ANTIBODY, QUANTITATIVE: Hep B S AB Quant (Post): 57.8 m[IU]/mL

## 2024-08-28 LAB — CBC
HCT: 28.6 % — ABNORMAL LOW (ref 39.0–52.0)
HCT: 27.4 % — ABNORMAL LOW (ref 39.0–52.0)
Hemoglobin: 9.1 g/dL — ABNORMAL LOW (ref 13.0–17.0)
Hemoglobin: 8.6 g/dL — ABNORMAL LOW (ref 13.0–17.0)
MCH: 29.3 pg (ref 26.0–34.0)
MCH: 28.9 pg (ref 26.0–34.0)
MCHC: 31.8 g/dL (ref 30.0–36.0)
MCHC: 31.4 g/dL (ref 30.0–36.0)
MCV: 92 fL (ref 80.0–100.0)
MCV: 91.9 fL (ref 80.0–100.0)
Platelets: 370 K/uL (ref 150–400)
Platelets: 346 K/uL (ref 150–400)
RBC: 3.11 MIL/uL — ABNORMAL LOW (ref 4.22–5.81)
RBC: 2.98 MIL/uL — ABNORMAL LOW (ref 4.22–5.81)
RDW: 17.1 % — ABNORMAL HIGH (ref 11.5–15.5)
RDW: 17 % — ABNORMAL HIGH (ref 11.5–15.5)
WBC: 12.6 K/uL — ABNORMAL HIGH (ref 4.0–10.5)
WBC: 16.2 K/uL — ABNORMAL HIGH (ref 4.0–10.5)
nRBC: 0 % (ref 0.0–0.2)
nRBC: 0.1 % (ref 0.0–0.2)

## 2024-08-28 LAB — RENAL FUNCTION PANEL
Albumin: 2.6 g/dL — ABNORMAL LOW (ref 3.5–5.0)
Anion gap: 18 — ABNORMAL HIGH (ref 5–15)
BUN: 37 mg/dL — ABNORMAL HIGH (ref 6–20)
CO2: 21 mmol/L — ABNORMAL LOW (ref 22–32)
Calcium: 7 mg/dL — ABNORMAL LOW (ref 8.9–10.3)
Chloride: 90 mmol/L — ABNORMAL LOW (ref 98–111)
Creatinine, Ser: 8.63 mg/dL — ABNORMAL HIGH (ref 0.61–1.24)
GFR, Estimated: 7 mL/min — ABNORMAL LOW (ref >60)
Glucose, Bld: 149 mg/dL — ABNORMAL HIGH (ref 70–99)
Phosphorus: 3.8 mg/dL (ref 2.5–4.6)
Potassium: 3.7 mmol/L (ref 3.5–5.1)
Sodium: 129 mmol/L — ABNORMAL LOW (ref 135–145)

## 2024-08-28 LAB — LIPASE, BLOOD: Lipase: 28 U/L (ref 11–51)

## 2024-08-28 LAB — POTASSIUM
Potassium: 3.5 mmol/L (ref 3.5–5.1)
Potassium: 3.3 mmol/L — ABNORMAL LOW (ref 3.5–5.1)

## 2024-08-28 LAB — PROCALCITONIN: Procalcitonin: 45.77 ng/mL

## 2024-08-28 LAB — GLUCOSE, CAPILLARY
Glucose-Capillary: 149 mg/dL — ABNORMAL HIGH (ref 70–99)
Glucose-Capillary: 150 mg/dL — ABNORMAL HIGH (ref 70–99)
Glucose-Capillary: 133 mg/dL — ABNORMAL HIGH (ref 70–99)
Glucose-Capillary: 148 mg/dL — ABNORMAL HIGH (ref 70–99)
Glucose-Capillary: 136 mg/dL — ABNORMAL HIGH (ref 70–99)
Glucose-Capillary: 147 mg/dL — ABNORMAL HIGH (ref 70–99)

## 2024-08-28 LAB — CREATININE, SERUM
Creatinine, Ser: 8.78 mg/dL — ABNORMAL HIGH (ref 0.61–1.24)
GFR, Estimated: 7 mL/min — ABNORMAL LOW (ref >60)

## 2024-08-28 LAB — LACTIC ACID, PLASMA
Lactic Acid, Venous: 3.1 mmol/L (ref 0.5–1.9)
Lactic Acid, Venous: 3.3 mmol/L (ref 0.5–1.9)

## 2024-08-28 LAB — BASIC METABOLIC PANEL WITH GFR
Anion gap: 19 — ABNORMAL HIGH (ref 5–15)
BUN: 28 mg/dL — ABNORMAL HIGH (ref 6–20)
CO2: 22 mmol/L (ref 22–32)
Calcium: 7.7 mg/dL — ABNORMAL LOW (ref 8.9–10.3)
Chloride: 91 mmol/L — ABNORMAL LOW (ref 98–111)
Creatinine, Ser: 7.15 mg/dL — ABNORMAL HIGH (ref 0.61–1.24)
GFR, Estimated: 8 mL/min — ABNORMAL LOW (ref >60)
Glucose, Bld: 145 mg/dL — ABNORMAL HIGH (ref 70–99)
Potassium: 3.3 mmol/L — ABNORMAL LOW (ref 3.5–5.1)
Sodium: 132 mmol/L — ABNORMAL LOW (ref 135–145)

## 2024-08-28 LAB — HEPARIN LEVEL (UNFRACTIONATED): Heparin Unfractionated: 0.17 [IU]/mL — ABNORMAL LOW (ref 0.30–0.70)

## 2024-08-28 LAB — MAGNESIUM: Magnesium: 2.4 mg/dL (ref 1.7–2.4)

## 2024-08-28 LAB — POCT ACTIVATED CLOTTING TIME
Activated Clotting Time: 222 s
Activated Clotting Time: 383 s

## 2024-08-28 LAB — PHOSPHORUS: Phosphorus: 4.2 mg/dL (ref 2.5–4.6)

## 2024-08-28 LAB — SURGICAL PATHOLOGY

## 2024-08-28 SURGERY — RIGHT/LEFT HEART CATH AND CORONARY ANGIOGRAPHY
Anesthesia: LOCAL

## 2024-08-28 MED ORDER — ASPIRIN 81 MG PO TBEC: 81.0000 mg | DELAYED_RELEASE_TABLET | Freq: Every day | ORAL | Status: DC

## 2024-08-28 MED ORDER — SODIUM CHLORIDE 0.9 % IV SOLN: 250.0000 mL | INTRAVENOUS | Status: AC | PRN

## 2024-08-28 MED ORDER — MAGNESIUM SULFATE 2 GM/50ML IV SOLN
2.0000 g | Freq: Once | INTRAVENOUS | Status: AC
Start: 2024-08-28 — End: 2024-08-28
  Administered 2024-08-28: 2 g via INTRAVENOUS
  Filled 2024-08-28: qty 50

## 2024-08-28 MED ORDER — SODIUM CHLORIDE 0.9% FLUSH
3.0000 mL | Freq: Two times a day (BID) | INTRAVENOUS | Status: DC
  Administered 2024-08-28 – 2024-09-13 (×32): 3 mL via INTRAVENOUS

## 2024-08-28 MED ORDER — CLOPIDOGREL BISULFATE 75 MG PO TABS
75.0000 mg | ORAL_TABLET | Freq: Every day | ORAL | Status: DC
  Administered 2024-08-29 – 2024-09-12 (×15): 75 mg via ORAL
  Filled 2024-08-28 (×15): qty 1

## 2024-08-28 MED ORDER — PROCHLORPERAZINE EDISYLATE 10 MG/2ML IJ SOLN
10.0000 mg | Freq: Four times a day (QID) | INTRAMUSCULAR | Status: DC | PRN
  Filled 2024-08-28: qty 2

## 2024-08-28 MED ORDER — EPINEPHRINE HCL 5 MG/250ML IV SOLN IN NS
INTRAVENOUS | Status: AC
  Filled 2024-08-28: qty 250

## 2024-08-28 MED ORDER — SODIUM CHLORIDE 0.9% IV SOLUTION: INTRAVENOUS | Status: DC

## 2024-08-28 MED ORDER — HEPARIN (PORCINE) IN NACL 1000-0.9 UT/500ML-% IV SOLN
INTRAVENOUS | Status: DC | PRN
Start: 2024-08-28 — End: 2024-08-28
  Administered 2024-08-28 (×2): 500 mL

## 2024-08-28 MED ORDER — HEPARIN SODIUM (PORCINE) 1000 UNIT/ML IJ SOLN
INTRAMUSCULAR | Status: DC | PRN
  Administered 2024-08-28: 9000 [IU] via INTRAVENOUS

## 2024-08-28 MED ORDER — HYDRALAZINE HCL 20 MG/ML IJ SOLN: 10.0000 mg | INTRAMUSCULAR | Status: AC | PRN

## 2024-08-28 MED ORDER — HEPARIN SODIUM (PORCINE) 1000 UNIT/ML IJ SOLN
INTRAMUSCULAR | Status: AC
  Filled 2024-08-28: qty 10

## 2024-08-28 MED ORDER — ASPIRIN 81 MG PO CHEW: 81.0000 mg | CHEWABLE_TABLET | ORAL | Status: DC

## 2024-08-28 MED ORDER — LIDOCAINE HCL (PF) 1 % IJ SOLN
INTRAMUSCULAR | Status: DC | PRN
  Administered 2024-08-28 (×2): 5 mL

## 2024-08-28 MED ORDER — IOHEXOL 350 MG/ML SOLN
INTRAVENOUS | Status: DC | PRN
  Administered 2024-08-28: 150 mL

## 2024-08-28 MED ORDER — LIDOCAINE HCL (PF) 1 % IJ SOLN
INTRAMUSCULAR | Status: AC
  Filled 2024-08-28: qty 30

## 2024-08-28 MED ORDER — HYDROMORPHONE HCL 1 MG/ML IJ SOLN
0.5000 mg | INTRAMUSCULAR | Status: DC | PRN
  Administered 2024-08-28 (×2): 0.5 mg via INTRAVENOUS
  Filled 2024-08-28: qty 1
  Filled 2024-08-28 (×2): qty 0.5
  Filled 2024-08-28: qty 1

## 2024-08-28 MED ORDER — ASPIRIN 81 MG PO TBEC
81.0000 mg | DELAYED_RELEASE_TABLET | Freq: Every day | ORAL | Status: DC
  Administered 2024-08-29 – 2024-09-13 (×16): 81 mg via ORAL
  Filled 2024-08-28 (×16): qty 1

## 2024-08-28 MED ORDER — PANTOPRAZOLE SODIUM 40 MG IV SOLR
40.0000 mg | Freq: Every day | INTRAVENOUS | Status: DC
  Administered 2024-08-28 – 2024-08-29 (×2): 40 mg via INTRAVENOUS
  Filled 2024-08-28 (×2): qty 10

## 2024-08-28 MED ORDER — ASPIRIN 325 MG PO TABS
325.0000 mg | ORAL_TABLET | Freq: Once | ORAL | Status: AC
Start: 2024-08-28 — End: 2024-08-28
  Administered 2024-08-28: 325 mg via ORAL
  Filled 2024-08-28: qty 1

## 2024-08-28 MED ORDER — FENTANYL CITRATE (PF) 100 MCG/2ML IJ SOLN
INTRAMUSCULAR | Status: AC
  Filled 2024-08-28: qty 2

## 2024-08-28 MED ORDER — FENTANYL CITRATE (PF) 100 MCG/2ML IJ SOLN
INTRAMUSCULAR | Status: DC | PRN
  Administered 2024-08-28 (×3): 25 ug via INTRAVENOUS

## 2024-08-28 MED ORDER — SODIUM CHLORIDE 0.9% IV SOLUTION
INTRAVENOUS | Status: DC | PRN
Start: 2024-08-28 — End: 2024-09-12

## 2024-08-28 MED ORDER — SODIUM CHLORIDE 0.9% FLUSH: 3.0000 mL | INTRAVENOUS | Status: DC | PRN

## 2024-08-28 MED ORDER — CHLORHEXIDINE GLUCONATE CLOTH 2 % EX PADS
6.0000 | MEDICATED_PAD | Freq: Every day | CUTANEOUS | Status: DC
  Administered 2024-08-30: 6 via TOPICAL

## 2024-08-28 MED ORDER — CLOPIDOGREL BISULFATE 300 MG PO TABS
ORAL_TABLET | ORAL | Status: AC
  Filled 2024-08-28: qty 2

## 2024-08-28 MED ORDER — SODIUM CHLORIDE 0.9 % IV SOLN: INTRAVENOUS | Status: AC

## 2024-08-28 MED ORDER — HEPARIN SODIUM (PORCINE) 5000 UNIT/ML IJ SOLN
5000.0000 [IU] | Freq: Three times a day (TID) | INTRAMUSCULAR | Status: DC
  Administered 2024-08-29 – 2024-09-13 (×42): 5000 [IU] via SUBCUTANEOUS
  Filled 2024-08-28 (×46): qty 1

## 2024-08-28 MED ORDER — HEPARIN (PORCINE) 25000 UT/250ML-% IV SOLN
1400.0000 [IU]/h | INTRAVENOUS | Status: DC
  Administered 2024-08-28: 1100 [IU]/h via INTRAVENOUS
  Filled 2024-08-28: qty 250

## 2024-08-28 MED ORDER — CALCIUM GLUCONATE-NACL 2-0.675 GM/100ML-% IV SOLN
2.0000 g | Freq: Once | INTRAVENOUS | Status: AC
  Administered 2024-08-28: 2000 mg via INTRAVENOUS
  Filled 2024-08-28: qty 100

## 2024-08-28 SURGICAL SUPPLY — 15 items
BALLOON SAPPHIRE NC24 3.5X18 (BALLOONS) IMPLANT
BALLOON SCOREFLEX 3.50X15 (BALLOONS) IMPLANT
CATH INFINITI 5FR MULTPACK ANG (CATHETERS) IMPLANT
CATH SWAN GANZ 7F STRAIGHT (CATHETERS) IMPLANT
CATH VISTA GUIDE 6FR XBLD 3.5 (CATHETERS) IMPLANT
KIT ENCORE 26 ADVANTAGE (KITS) IMPLANT
PACK CARDIAC CATHETERIZATION (CUSTOM PROCEDURE TRAY) ×1 IMPLANT
SET ATX-X65L (MISCELLANEOUS) IMPLANT
SHEATH PINNACLE 5F 10CM (SHEATH) IMPLANT
SHEATH PINNACLE 7F 10CM (SHEATH) IMPLANT
STENT SYNERGY XD 3.0X24 (Permanent Stent) IMPLANT
WIRE EMERALD 3MM-J .025X260CM (WIRE) IMPLANT
WIRE EMERALD 3MM-J .035X260CM (WIRE) IMPLANT
WIRE MICRO SET SILHO 5FR 7 (SHEATH) IMPLANT
WIRE RUNTHROUGH .014X180CM (WIRE) IMPLANT
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
## 2024-08-28 NOTE — Progress Notes (Signed)
   08/28/24 1233  Mobility (See group info for [facility] equipment and weight capacities recommendations)  HOB Elevated/Bed Position Self regulated  Activity Turned to right side  [employer];Other (Comment) (pre-cath)  CHG  [place]ath Completed Yes     Per protocol.

## 2024-08-28 NOTE — Progress Notes (Signed)
 Rounding Note   Patient Name: [name] Date of Encounter: 08/28/2024  [facility] Cardiologist: None   Subjective He remains on pressors, currently on epinephrine  at 11 mcg/min and norepinephrine  at 9 mcg/min.  Overnight troponin went up to greater than 24,000.  He denies any chest pain or dyspnea  Scheduled Meds:  allopurinol  100 mg Oral q AM   atorvastatin   80 mg Oral QHS   calcitRIOL   1 mcg Oral Q M,W,F-HD   Chlorhexidine  Gluconate Cloth  6 each Topical Q0600   docusate sodium   100 mg Oral BID   famotidine   20 mg Oral QHS   ferric citrate   420 mg Oral TID WC   oxidized cellulose  1 each Topical Once   pantoprazole   40 mg Oral Daily   senna  1 tablet Oral BID   tamsulosin   0.4 mg Oral QPM   Continuous Infusions:  sodium chloride      epinephrine  10 mcg/min (08/28/24 0700)   ertapenem Stopped (08/28/24 0317)   heparin  1,100 Units/hr (08/28/24 0700)   norepinephrine  (LEVOPHED ) Adult infusion 8 mcg/min (08/28/24 0700)   PRN Meds: acetaminophen , bisacodyl, HYDROmorphone  (DILAUDID ) injection, polyethylene glycol   Vital Signs  Vitals:   08/28/24 0628 08/28/24 0629 08/28/24 0630 08/28/24 0755  BP:      Pulse: 82 82 82   Resp: (!) 27 (!) 30 (!) 29   Temp:    99.6 F (37.6 C)  TempSrc:    Oral  SpO2: 92% 92% (!) 87%   Weight:      Height:        Intake/Output Summary (Last 24 hours) at 08/28/2024 0850 Last data filed at 08/28/2024 0700 Gross per 24 hour  Intake 2823.45 ml  Output 1300 ml  Net 1523.45 ml      08/28/2024    4:28 AM 08/27/2024   11:41 AM 08/25/2024    7:34 AM  Last 3 Weights  Weight (lbs) 212 lb 8.4 oz 212 lb 15.4 oz 208 lb 5.4 oz  Weight (kg) 96.4 kg 96.6 kg 94.5 kg      Telemetry Sinus with first degree AV block, HR 80s - Personally Reviewed  ECG  RBBB, sinus with first degree AV block - Personally Reviewed  Physical Exam  GEN: No acute distress.   Neck: No JVD Cardiac: RRR, no murmurs, Respiratory: Clear to  auscultation bilaterally. GI: Soft, nontender MS: No edema; No deformity. Neuro:  Nonfocal  Psych: Normal affect   Labs High Sensitivity Troponin:   Recent Labs  Lab 08/27/24 1820  TROPONINIHS >24,000*     Chemistry Recent Labs  Lab 08/27/24 0956 08/27/24 1303 08/27/24 1354 08/27/24 1814 08/27/24 2024 08/28/24 0232  NA 138 134*  --  132*  --  132*  K 5.8* 5.1   < > 4.4 4.4 3.3*  CL 97*  --   --  95*  --  91*  CO2 11*  --   --  17*  --  22  GLUCOSE 46*  --   --  142*  --  145*  BUN 49*  --   --  56*  --  28*  CREATININE 12.65*  --   --  12.28*  --  7.15*  CALCIUM  7.4*  --   --  6.9*  --  7.7*  MG  --   --   --  2.0  --  2.4  PROT  --   --   --  6.4*  --   --  ALBUMIN   --   --   --  2.6*  --   --   AST  --   --   --  287*  --   --   ALT  --   --   --  30  --   --   ALKPHOS  --   --   --  174*  --   --   BILITOT  --   --   --  0.8  --   --   GFRNONAA 4*  --   --  4*  --  8*  ANIONGAP 30*  --   --  20*  --  19*   < > = values in this interval not displayed.    Lipids No results for input(s): CHOL, TRIG, HDL, LABVLDL, LDLCALC, CHOLHDL in the last 168 hours.  Hematology Recent Labs  Lab 08/27/24 0956 08/27/24 1303 08/27/24 1814 08/28/24 0232  WBC 15.8*  --  14.2* 12.6*  RBC 3.06*  --  3.21* 3.11*  HGB 9.0* 10.9* 9.2* 9.1*  HCT 31.4* 32.0* 30.4* 28.6*  MCV 102.6*  --  94.7 92.0  MCH 29.4  --  28.7 29.3  MCHC 28.7*  --  30.3 31.8  RDW 17.2*  --  17.1* 17.1*  PLT 326  --  361 370   Thyroid No results for input(s): TSH, FREET4 in the last 168 hours.  BNP Recent Labs  Lab 08/27/24 0956  BNP 569.7*    DDimer No results for input(s): DDIMER in the last 168 hours.   Radiology  CT Angio Chest Pulmonary Embolism (PE) W or WO Contrast Result Date: 08/27/2024 EXAM: CTA of the Chest with contrast for PE 08/27/2024 05:11:49 PM TECHNIQUE: CTA of the chest was performed after the administration of intravenous contrast. Multiplanar reformatted images  are provided for review. MIP images are provided for review. Automated exposure control, iterative reconstruction, and/or weight based adjustment of the mA/kV was utilized to reduce the radiation dose to as low as reasonably achievable. COMPARISON: Chest x-ray and CT 03/20/2023. CLINICAL HISTORY: Eval PE; RV dilated on bedside POCUS. 75mL omni 350; CT Angio Chest Pulmonary Embolism (PE) W or WO Contrast; eval PE; RV dilated on bedside POCUS. FINDINGS: PULMONARY ARTERIES: Pulmonary arteries are adequately opacified for evaluation. No pulmonary embolism. The main pulmonary artery is dilated, measuring 45 mm in diameter, which can be seen with pulmonary hypertension. MEDIASTINUM: Left atrial appendage occlusion device. No pericardial effusion. Coronary artery and aortic atherosclerotic calcifications. There is flattening of the interventricular septum with compression of the left ventricle. There is no acute dissection, intramural hematoma, aortic ulceration, or rupture of the thoracic aorta. LYMPH NODES: No mediastinal, hilar or axillary lymphadenopathy. LUNGS AND PLEURA: Scarring and atelectasis greatest in the lower lungs. Calcified right pleural plaques and pleural thickening. Trace right pleural effusion similar to prior. No pneumothorax. No focal consolidation or pulmonary edema. ESOPHAGUS: Fluid throughout the esophagus reaching the thoracic inlet. UPPER ABDOMEN: Nodular hepatic contour suggesting cirrhosis. Cholecystectomy. Partially visualized hazy stranding about the duodenum and pancreatic head. Small amount of perihepatic ascites. SOFT TISSUES AND BONES: Bilateral gynecomastia. No acute bone abnormality. IMPRESSION: 1. No pulmonary embolism. 2. Main pulmonary artery dilation (45 mm) with interventricular septal flattening and left ventricular compression, suggesting pulmonary hypertension and elevated right heart pressures . 3. Fluid throughout the esophagus to the thoracic inlet, posing aspiration risk. 4.  Partially visualized hazy stranding about the duodenum and pancreatic head; correlate with lipase for pancreatitis and  recommend CT abdomen and pelvis with IV contrast. 5. Cirrhosis with small perihepatic ascites. Electronically signed by: Norman Gatlin MD 08/27/2024 05:35 PM EDT RP Workstation: [id number]   DG Chest Port 1 View Result Date: 08/27/2024 CLINICAL DATA:  Central line placement. EXAM: PORTABLE CHEST 1 VIEW COMPARISON:  08/12/2024 and CT chest 01/18/2023. FINDINGS: There is no visible central line. Trachea is midline given slight patient rotation. Heart size stable. Extensive pleuroparenchymal scarring and volume loss in the right hemithorax. Mild linear volume loss in the left lower lobe. Metallic densities project over the right chest. IMPRESSION: 1. No visible central line. 2. Pleuroparenchymal scarring in the right hemithorax. Electronically Signed   By: Newell Eke M.D.   On: 08/27/2024 16:31   ECHOCARDIOGRAM COMPLETE Result Date: 08/27/2024    ECHOCARDIOGRAM REPORT   Patient Name:   Kahlil [facility] Date of Exam: 08/27/2024 Medical Rec #:  [id number]      Height:       73.0 in Accession #:    [id number]     Weight:       213.0 lb Date of Birth:  December 31, 1965      BSA:          2.210 m Patient Age:    58 years       BP:           121/96 mmHg Patient Gender: M              HR:           66 bpm. Exam Location:  Inpatient Procedure: 2D Echo, Cardiac Doppler and Color Doppler (Both Spectral and Color            Flow Doppler were utilized during procedure). STAT ECHO Indications:    Abnormal ECG R94.31  History:        Patient has prior history of Echocardiogram examinations, most                 recent 01/20/2023. CAD, PAD; Risk Factors:Hypertension and Sleep                 Apnea.  Sonographer:    Jayson Gaskins Referring Phys: [id number] JAMIE H DAGENHART  Sonographer Comments: Technically difficult study due to poor echo windows and Technically challenging study due to limited acoustic windows.  IMPRESSIONS  1. Technically difficult study.  2. Left ventricular ejection fraction, by estimation, is >75%. The left ventricle has hyperdynamic function. The left ventricle has no regional wall motion abnormalities.  3. RV/LV ratio is dilated. . Right ventricular systolic function visually reduced (severe). The right ventricular size is visually severely dilated.  4. The mitral valve is degenerative. Trivial mitral valve regurgitation. No evidence of mitral stenosis. Moderate to severe mitral annular calcification.  5. The aortic valve is calcified. Unable to determine aortic valve morphology due to image quality. There is severe calcifcation of the aortic valve. Aortic valve regurgitation is not visualized. Aortic valve sclerosis is present, with no evidence of aortic valve stenosis.  6. The inferior vena cava is dilated in size with <50% respiratory variability, suggesting right atrial pressure of 15 mmHg. Comparison(s): A prior study was performed on 01/16/2023. RV systolic function is severely reduced and size visually appears severly dialated, RV/LV ratio is dialated. Consider ruling out PE. Findings reported to Paula Southerly, MD via secure chat (acknowledged at 2:33pm 08/27/2024). Prior study reported LVEF 65-70%, indeterminate diastolic dysfunction, RV mildly reduced and mildly enlarged, RAP . FINDINGS  Left Ventricle: Left ventricular  ejection fraction, by estimation, is >75%. The left ventricle has hyperdynamic function. The left ventricle has no regional wall motion abnormalities. The left ventricular internal cavity size was small. There is borderline left ventricular hypertrophy. Left ventricular diastolic function could not be evaluated due to nondiagnostic images. Right Ventricle: RV/LV ratio is dilated. The right ventricular size is visually severely dilated. Right vetricular wall thickness was not well visualized. Right ventricular systolic function visually reduced (severe). Left Atrium: Left  atrial size was not well visualized. Right Atrium: Right atrial size was normal in size. Pericardium: There is no evidence of pericardial effusion. Mitral Valve: The mitral valve is degenerative in appearance. Moderate to severe mitral annular calcification. Trivial mitral valve regurgitation. No evidence of mitral valve stenosis. Tricuspid Valve: The tricuspid valve is grossly normal. Tricuspid valve regurgitation is mild. Aortic Valve: The aortic valve is calcified. There is severe calcifcation of the aortic valve. There is severe aortic valve annular calcification. Aortic valve regurgitation is not visualized. Aortic valve sclerosis is present, with no evidence of aortic  valve stenosis. Aortic valve mean gradient measures 2.0 mmHg. Aortic valve peak gradient measures 4.0 mmHg. Aortic valve area, by VTI measures 3.10 cm. Pulmonic Valve: The pulmonic valve was not well visualized. Pulmonic valve regurgitation is trivial. Aorta: The aortic root is normal in size and structure. Venous: The inferior vena cava is dilated in size with less than 50% respiratory variability, suggesting right atrial pressure of 15 mmHg. IAS/Shunts: No atrial level shunt detected by color flow Doppler.  LEFT VENTRICLE PLAX 2D LVIDd:         3.70 cm LVIDs:         1.80 cm LV PW:         1.00 cm LV IVS:        1.50 cm LVOT diam:     1.80 cm LV SV:         46 LV SV Index:   21 LVOT Area:     2.54 cm  IVC IVC diam: 2.10 cm LEFT ATRIUM           Index        RIGHT ATRIUM           Index LA Vol (A4C): 61.8 ml 27.97 ml/m  RA Area:     21.60 cm                                    RA Volume:   63.70 ml  28.83 ml/m  AORTIC VALVE AV Area (Vmax):    2.70 cm AV Area (Vmean):   2.57 cm AV Area (VTI):     3.10 cm AV Vmax:           100.00 cm/s AV Vmean:          72.500 cm/s AV VTI:            0.147 m AV Peak Grad:      4.0 mmHg AV Mean Grad:      2.0 mmHg LVOT Vmax:         106.00 cm/s LVOT Vmean:        73.200 cm/s LVOT VTI:          0.179 m  LVOT/AV VTI ratio: 1.22 MITRAL VALVE MV Area (PHT): 3.17 cm    SHUNTS MV Decel Time: 239 msec    Systemic VTI:  0.18 m MV E velocity: 87.50  cm/s  Systemic Diam: 1.80 cm Sunit Tolia Electronically signed by Madonna Large Signature Date/Time: 08/27/2024/2:37:04 PM    Final     Cardiac Studies   Patient Profile   58 y.o. male  with a hx of ESRD on HD (MWF), chronic hypotension on Midodrine , CAD s/p DES to LAD and LCx, HFpEF, paroxysmal atrial fibrillation s/p watchman 2/2 hx of AVMs, pSVT, hx of DVT, and OSA on CPAP who is being seen 08/27/2024 for the evaluation of hypotension and bradycardia   Assessment & Plan   Shock: Developed profound hypotension and bradycardia on 10/13.  He denies any chest pain.  Echocardiogram shows hyperdynamic systolic function though severely reduced RV function.  Troponins greater than 24,000.  Remains on levophed  and epinephrine  - Shock could be secondary to RV failure, RV appears severely reduced function on echo, though appears likely acute on chronic.  CTPA showed no PE.  RV infarct on ddx, though not reporting chest pain.  Right sided EKG done this morning, no ST elevations.  Given extent of troponin elevation and hemodynamic instability, recommend LHC/RHC.  Risks and benefits of cardiac catheterization have been discussed with the patient.  These include bleeding, infection, kidney damage, stroke, heart attack, death.  The patient understands these risks and is willing to proceed.  CAD: history of DES to LAD and Lcx.  Troponins>24K.  Denies chest pain of dyspnea -Continue ASA 81 mg daily -Continue statin -Continue heparin  gtt -Plan LHC/RHC as above   Bradycardia: profound bradycardia 10/13, in setting of metabolic derangements.  Resolved but remains on pressor support.  Plan to rule out ischemia as above  Afib: s/p watchman procedure.  Currently in sinus rhythm  Anemia: h/o AVMs.  Hgb stable 9.1  S/p R metatarsal amputation: p/w gangrene, s/p amputation on  10/11  CRITICAL CARE TIME: I have spent a total of 40 minutes with patient reviewing hospital notes, telemetry, EKGs, labs and examining the patient as well as establishing an assessment and plan that was discussed with the patient and the PCCM and Advanced Heart Failure teams.  > 50% of time was spent in direct patient care. The patient is critically ill with multi-organ system failure and requires high complexity decision making for assessment and support, frequent evaluation and titration of therapies, application of advanced monitoring technologies and extensive interpretation of multiple databases.  For questions or updates, please contact [facility] Please consult [email or web address] for contact info under      Signed, Lonni LITTIE Nanas, MD  08/28/2024, 8:50 AM

## 2024-08-28 NOTE — Progress Notes (Addendum)
 Right side EKG obtained per request from Dr. Kate. Photo of EKG attached to this note, also available in media tab. Physical copy placed in pt's chart.

## 2024-08-28 NOTE — Progress Notes (Signed)
 PT Cancellation Note  Patient Details Name: [name] MRN: [id number] DOB: Aug 15, 1966   Cancelled Treatment:    Reason Eval/Treat Not Completed: Medical issues which prohibited therapy; RN reports plans for cath lab today and hold PT.  Will follow up another day.   Montie Portal 08/28/2024, 12:06 PM Micheline Portal, PT Acute Rehabilitation Services [phone] 08/28/2024

## 2024-08-28 NOTE — Progress Notes (Signed)
 [facility] Progress Note  Subjective:  Looks better and feels better Had HD last night, K+ down to 3.3 this am Is on IV epi and levo gtt's today Has no c/o's.   Vitals:   08/28/24 0755 08/28/24 1128 08/28/24 1500 08/28/24 1507  BP:      Pulse:   84   Resp:   (!) 24   Temp: 99.6 F (37.6 C) (!) 101.7 F (38.7 C)  99.1 F (37.3 C)  TempSrc: Oral Oral  Oral  SpO2:   96%   Weight:      Height:        Exam: General: alert, sitting up in ICU bed Neck: JVD not elevated. Lungs: Clear bilaterally to auscultation  Heart: RRR with normal S1, S2. No RG.  Abdomen: Soft, non-tender, non-distended  Lower extremities: No edema; R foot in hard boot Neuro: Alert and oriented X 3. 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 Recent bacteremia. Per ID, should continue Erta thru 10/14.  ESRD: on HD MWF. Had HD last night. Next HD Wednesday.   Volume: no LE edema. BP typically low during HD per pt. Up 2-3kg by wts.   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 Secondary hyperparathyroidism: Ca ok. Cont home meds  CAD  A-fib  Myer Fret MD  [facility] 08/28/2024, 4:32 PM  Recent Labs  Lab 08/27/24 1814 08/27/24 2024 08/28/24 0232 08/28/24 1101  HGB 9.2*  --  9.1*  --   ALBUMIN  2.6*  --   --   --   CALCIUM  6.9*  --  7.7*  --   PHOS 5.2*  --  4.2  --   CREATININE 12.28*  --  7.15*  --   K 4.4   < > 3.3* 3.5   < > = values in this interval not displayed.   No results for input(s): IRON , TIBC, FERRITIN in the last 168 hours. Inpatient medications:  allopurinol  100 mg Oral q AM   [START ON 08/29/2024] aspirin  EC  81 mg Oral Daily   atorvastatin   80 mg Oral QHS   calcitRIOL   1 mcg Oral Q M,W,F-HD   Chlorhexidine   Gluconate Cloth  6 each Topical Q0600   docusate sodium   100 mg Oral BID   famotidine   20 mg Oral QHS   ferric citrate   420 mg Oral TID WC   oxidized cellulose  1 each Topical Once   pantoprazole  (PROTONIX ) IV  40 mg Intravenous Daily   senna  1 tablet Oral BID   tamsulosin   0.4 mg Oral QPM    epinephrine  11 mcg/min (08/28/24 1019)   ertapenem Stopped (08/28/24 0317)   heparin  1,400 Units/hr (08/28/24 1233)   norepinephrine  (LEVOPHED ) Adult infusion 8 mcg/min (08/28/24 0700)   acetaminophen , bisacodyl, HYDROmorphone  (DILAUDID ) injection, polyethylene glycol

## 2024-08-28 NOTE — Progress Notes (Signed)
   08/28/24 0045  Vitals  Temp (!) 97.5 F (36.4 C)  Temp Source Axillary  BP (!) 103/52  MAP (mmHg) (!) 64  BP Method Automatic  Pulse Rate 75  ECG Heart Rate 75  Resp (!) 29  Oxygen Therapy  SpO2 93 %  O2 Device Room Air  During Treatment Monitoring  Blood Flow Rate (mL/min) 0 mL/min  Arterial Pressure (mmHg) 18.99 mmHg  Venous Pressure (mmHg) -30.3 mmHg  TMP (mmHg) 15.55 mmHg  Ultrafiltration Rate (mL/min) 225 mL/min  Dialysate Flow Rate (mL/min) 299 ml/min  Dialysate Potassium Concentration 2  Dialysate Calcium  Concentration 2.5  Duration of HD Treatment -hour(s) 3 hour(s)  Cumulative Fluid Removed (mL) per Treatment  1300.06  HD Safety Checks Performed Yes  Intra-Hemodialysis Comments Tx completed  Post Treatment  Dialyzer Clearance Clear  Liters Processed 67.9  Fluid Removed (mL) 1300 mL  Tolerated HD Treatment Yes  AVG/AVF Arterial Site Held (minutes) 8 minutes  AVG/AVF Venous Site Held (minutes) 7 minutes  Note  Patient Observations Pt stable

## 2024-08-28 NOTE — H&P (View-Only) (Signed)
 Rounding Note   Patient Name: [name] Date of Encounter: 08/28/2024  [facility] Cardiologist: None   Subjective He remains on pressors, currently on epinephrine  at 11 mcg/min and norepinephrine  at 9 mcg/min.  Overnight troponin went up to greater than 24,000.  He denies any chest pain or dyspnea  Scheduled Meds:  allopurinol  100 mg Oral q AM   atorvastatin   80 mg Oral QHS   calcitRIOL   1 mcg Oral Q M,W,F-HD   Chlorhexidine  Gluconate Cloth  6 each Topical Q0600   docusate sodium   100 mg Oral BID   famotidine   20 mg Oral QHS   ferric citrate   420 mg Oral TID WC   oxidized cellulose  1 each Topical Once   pantoprazole   40 mg Oral Daily   senna  1 tablet Oral BID   tamsulosin   0.4 mg Oral QPM   Continuous Infusions:  sodium chloride      epinephrine  10 mcg/min (08/28/24 0700)   ertapenem Stopped (08/28/24 0317)   heparin  1,100 Units/hr (08/28/24 0700)   norepinephrine  (LEVOPHED ) Adult infusion 8 mcg/min (08/28/24 0700)   PRN Meds: acetaminophen , bisacodyl, HYDROmorphone  (DILAUDID ) injection, polyethylene glycol   Vital Signs  Vitals:   08/28/24 0628 08/28/24 0629 08/28/24 0630 08/28/24 0755  BP:      Pulse: 82 82 82   Resp: (!) 27 (!) 30 (!) 29   Temp:    99.6 F (37.6 C)  TempSrc:    Oral  SpO2: 92% 92% (!) 87%   Weight:      Height:        Intake/Output Summary (Last 24 hours) at 08/28/2024 0850 Last data filed at 08/28/2024 0700 Gross per 24 hour  Intake 2823.45 ml  Output 1300 ml  Net 1523.45 ml      08/28/2024    4:28 AM 08/27/2024   11:41 AM 08/25/2024    7:34 AM  Last 3 Weights  Weight (lbs) 212 lb 8.4 oz 212 lb 15.4 oz 208 lb 5.4 oz  Weight (kg) 96.4 kg 96.6 kg 94.5 kg      Telemetry Sinus with first degree AV block, HR 80s - Personally Reviewed  ECG  RBBB, sinus with first degree AV block - Personally Reviewed  Physical Exam  GEN: No acute distress.   Neck: No JVD Cardiac: RRR, no murmurs, Respiratory: Clear to  auscultation bilaterally. GI: Soft, nontender MS: No edema; No deformity. Neuro:  Nonfocal  Psych: Normal affect   Labs High Sensitivity Troponin:   Recent Labs  Lab 08/27/24 1820  TROPONINIHS >24,000*     Chemistry Recent Labs  Lab 08/27/24 0956 08/27/24 1303 08/27/24 1354 08/27/24 1814 08/27/24 2024 08/28/24 0232  NA 138 134*  --  132*  --  132*  K 5.8* 5.1   < > 4.4 4.4 3.3*  CL 97*  --   --  95*  --  91*  CO2 11*  --   --  17*  --  22  GLUCOSE 46*  --   --  142*  --  145*  BUN 49*  --   --  56*  --  28*  CREATININE 12.65*  --   --  12.28*  --  7.15*  CALCIUM  7.4*  --   --  6.9*  --  7.7*  MG  --   --   --  2.0  --  2.4  PROT  --   --   --  6.4*  --   --  ALBUMIN   --   --   --  2.6*  --   --   AST  --   --   --  287*  --   --   ALT  --   --   --  30  --   --   ALKPHOS  --   --   --  174*  --   --   BILITOT  --   --   --  0.8  --   --   GFRNONAA 4*  --   --  4*  --  8*  ANIONGAP 30*  --   --  20*  --  19*   < > = values in this interval not displayed.    Lipids No results for input(s): CHOL, TRIG, HDL, LABVLDL, LDLCALC, CHOLHDL in the last 168 hours.  Hematology Recent Labs  Lab 08/27/24 0956 08/27/24 1303 08/27/24 1814 08/28/24 0232  WBC 15.8*  --  14.2* 12.6*  RBC 3.06*  --  3.21* 3.11*  HGB 9.0* 10.9* 9.2* 9.1*  HCT 31.4* 32.0* 30.4* 28.6*  MCV 102.6*  --  94.7 92.0  MCH 29.4  --  28.7 29.3  MCHC 28.7*  --  30.3 31.8  RDW 17.2*  --  17.1* 17.1*  PLT 326  --  361 370   Thyroid No results for input(s): TSH, FREET4 in the last 168 hours.  BNP Recent Labs  Lab 08/27/24 0956  BNP 569.7*    DDimer No results for input(s): DDIMER in the last 168 hours.   Radiology  CT Angio Chest Pulmonary Embolism (PE) W or WO Contrast Result Date: 08/27/2024 EXAM: CTA of the Chest with contrast for PE 08/27/2024 05:11:49 PM TECHNIQUE: CTA of the chest was performed after the administration of intravenous contrast. Multiplanar reformatted images  are provided for review. MIP images are provided for review. Automated exposure control, iterative reconstruction, and/or weight based adjustment of the mA/kV was utilized to reduce the radiation dose to as low as reasonably achievable. COMPARISON: Chest x-ray and CT 03/20/2023. CLINICAL HISTORY: Eval PE; RV dilated on bedside POCUS. 75mL omni 350; CT Angio Chest Pulmonary Embolism (PE) W or WO Contrast; eval PE; RV dilated on bedside POCUS. FINDINGS: PULMONARY ARTERIES: Pulmonary arteries are adequately opacified for evaluation. No pulmonary embolism. The main pulmonary artery is dilated, measuring 45 mm in diameter, which can be seen with pulmonary hypertension. MEDIASTINUM: Left atrial appendage occlusion device. No pericardial effusion. Coronary artery and aortic atherosclerotic calcifications. There is flattening of the interventricular septum with compression of the left ventricle. There is no acute dissection, intramural hematoma, aortic ulceration, or rupture of the thoracic aorta. LYMPH NODES: No mediastinal, hilar or axillary lymphadenopathy. LUNGS AND PLEURA: Scarring and atelectasis greatest in the lower lungs. Calcified right pleural plaques and pleural thickening. Trace right pleural effusion similar to prior. No pneumothorax. No focal consolidation or pulmonary edema. ESOPHAGUS: Fluid throughout the esophagus reaching the thoracic inlet. UPPER ABDOMEN: Nodular hepatic contour suggesting cirrhosis. Cholecystectomy. Partially visualized hazy stranding about the duodenum and pancreatic head. Small amount of perihepatic ascites. SOFT TISSUES AND BONES: Bilateral gynecomastia. No acute bone abnormality. IMPRESSION: 1. No pulmonary embolism. 2. Main pulmonary artery dilation (45 mm) with interventricular septal flattening and left ventricular compression, suggesting pulmonary hypertension and elevated right heart pressures . 3. Fluid throughout the esophagus to the thoracic inlet, posing aspiration risk. 4.  Partially visualized hazy stranding about the duodenum and pancreatic head; correlate with lipase for pancreatitis and  recommend CT abdomen and pelvis with IV contrast. 5. Cirrhosis with small perihepatic ascites. Electronically signed by: Norman Gatlin MD 08/27/2024 05:35 PM EDT RP Workstation: [id number]   DG Chest Port 1 View Result Date: 08/27/2024 CLINICAL DATA:  Central line placement. EXAM: PORTABLE CHEST 1 VIEW COMPARISON:  08/12/2024 and CT chest 01/18/2023. FINDINGS: There is no visible central line. Trachea is midline given slight patient rotation. Heart size stable. Extensive pleuroparenchymal scarring and volume loss in the right hemithorax. Mild linear volume loss in the left lower lobe. Metallic densities project over the right chest. IMPRESSION: 1. No visible central line. 2. Pleuroparenchymal scarring in the right hemithorax. Electronically Signed   By: Newell Eke M.D.   On: 08/27/2024 16:31   ECHOCARDIOGRAM COMPLETE Result Date: 08/27/2024    ECHOCARDIOGRAM REPORT   Patient Name:   [name] [facility] Date of Exam: 08/27/2024 Medical Rec #:  [id number]      Height:       73.0 in Accession #:    [id number]     Weight:       213.0 lb Date of Birth:  December 31, 1965      BSA:          2.210 m Patient Age:    58 years       BP:           121/96 mmHg Patient Gender: M              HR:           66 bpm. Exam Location:  Inpatient Procedure: 2D Echo, Cardiac Doppler and Color Doppler (Both Spectral and Color            Flow Doppler were utilized during procedure). STAT ECHO Indications:    Abnormal ECG R94.31  History:        Patient has prior history of Echocardiogram examinations, most                 recent 01/20/2023. CAD, PAD; Risk Factors:Hypertension and Sleep                 Apnea.  Sonographer:    Jayson Gaskins Referring Phys: [id number] JAMIE H DAGENHART  Sonographer Comments: Technically difficult study due to poor echo windows and Technically challenging study due to limited acoustic windows.  IMPRESSIONS  1. Technically difficult study.  2. Left ventricular ejection fraction, by estimation, is >75%. The left ventricle has hyperdynamic function. The left ventricle has no regional wall motion abnormalities.  3. RV/LV ratio is dilated. . Right ventricular systolic function visually reduced (severe). The right ventricular size is visually severely dilated.  4. The mitral valve is degenerative. Trivial mitral valve regurgitation. No evidence of mitral stenosis. Moderate to severe mitral annular calcification.  5. The aortic valve is calcified. Unable to determine aortic valve morphology due to image quality. There is severe calcifcation of the aortic valve. Aortic valve regurgitation is not visualized. Aortic valve sclerosis is present, with no evidence of aortic valve stenosis.  6. The inferior vena cava is dilated in size with <50% respiratory variability, suggesting right atrial pressure of 15 mmHg. Comparison(s): A prior study was performed on 01/16/2023. RV systolic function is severely reduced and size visually appears severly dialated, RV/LV ratio is dialated. Consider ruling out PE. Findings reported to Paula Southerly, MD via secure chat (acknowledged at 2:33pm 08/27/2024). Prior study reported LVEF 65-70%, indeterminate diastolic dysfunction, RV mildly reduced and mildly enlarged, RAP . FINDINGS  Left Ventricle: Left ventricular  ejection fraction, by estimation, is >75%. The left ventricle has hyperdynamic function. The left ventricle has no regional wall motion abnormalities. The left ventricular internal cavity size was small. There is borderline left ventricular hypertrophy. Left ventricular diastolic function could not be evaluated due to nondiagnostic images. Right Ventricle: RV/LV ratio is dilated. The right ventricular size is visually severely dilated. Right vetricular wall thickness was not well visualized. Right ventricular systolic function visually reduced (severe). Left Atrium: Left  atrial size was not well visualized. Right Atrium: Right atrial size was normal in size. Pericardium: There is no evidence of pericardial effusion. Mitral Valve: The mitral valve is degenerative in appearance. Moderate to severe mitral annular calcification. Trivial mitral valve regurgitation. No evidence of mitral valve stenosis. Tricuspid Valve: The tricuspid valve is grossly normal. Tricuspid valve regurgitation is mild. Aortic Valve: The aortic valve is calcified. There is severe calcifcation of the aortic valve. There is severe aortic valve annular calcification. Aortic valve regurgitation is not visualized. Aortic valve sclerosis is present, with no evidence of aortic  valve stenosis. Aortic valve mean gradient measures 2.0 mmHg. Aortic valve peak gradient measures 4.0 mmHg. Aortic valve area, by VTI measures 3.10 cm. Pulmonic Valve: The pulmonic valve was not well visualized. Pulmonic valve regurgitation is trivial. Aorta: The aortic root is normal in size and structure. Venous: The inferior vena cava is dilated in size with less than 50% respiratory variability, suggesting right atrial pressure of 15 mmHg. IAS/Shunts: No atrial level shunt detected by color flow Doppler.  LEFT VENTRICLE PLAX 2D LVIDd:         3.70 cm LVIDs:         1.80 cm LV PW:         1.00 cm LV IVS:        1.50 cm LVOT diam:     1.80 cm LV SV:         46 LV SV Index:   21 LVOT Area:     2.54 cm  IVC IVC diam: 2.10 cm LEFT ATRIUM           Index        RIGHT ATRIUM           Index LA Vol (A4C): 61.8 ml 27.97 ml/m  RA Area:     21.60 cm                                    RA Volume:   63.70 ml  28.83 ml/m  AORTIC VALVE AV Area (Vmax):    2.70 cm AV Area (Vmean):   2.57 cm AV Area (VTI):     3.10 cm AV Vmax:           100.00 cm/s AV Vmean:          72.500 cm/s AV VTI:            0.147 m AV Peak Grad:      4.0 mmHg AV Mean Grad:      2.0 mmHg LVOT Vmax:         106.00 cm/s LVOT Vmean:        73.200 cm/s LVOT VTI:          0.179 m  LVOT/AV VTI ratio: 1.22 MITRAL VALVE MV Area (PHT): 3.17 cm    SHUNTS MV Decel Time: 239 msec    Systemic VTI:  0.18 m MV E velocity: 87.50  cm/s  Systemic Diam: 1.80 cm Sunit Tolia Electronically signed by Madonna Large Signature Date/Time: 08/27/2024/2:37:04 PM    Final     Cardiac Studies   Patient Profile   58 y.o. male  with a hx of ESRD on HD (MWF), chronic hypotension on Midodrine , CAD s/p DES to LAD and LCx, HFpEF, paroxysmal atrial fibrillation s/p watchman 2/2 hx of AVMs, pSVT, hx of DVT, and OSA on CPAP who is being seen 08/27/2024 for the evaluation of hypotension and bradycardia   Assessment & Plan   Shock: Developed profound hypotension and bradycardia on 10/13.  He denies any chest pain.  Echocardiogram shows hyperdynamic systolic function though severely reduced RV function.  Troponins greater than 24,000.  Remains on levophed  and epinephrine  - Shock could be secondary to RV failure, RV appears severely reduced function on echo, though appears likely acute on chronic.  CTPA showed no PE.  RV infarct on ddx, though not reporting chest pain.  Right sided EKG done this morning, no ST elevations.  Given extent of troponin elevation and hemodynamic instability, recommend LHC/RHC.  Risks and benefits of cardiac catheterization have been discussed with the patient.  These include bleeding, infection, kidney damage, stroke, heart attack, death.  The patient understands these risks and is willing to proceed.  CAD: history of DES to LAD and Lcx.  Troponins>24K.  Denies chest pain of dyspnea -Continue ASA 81 mg daily -Continue statin -Continue heparin  gtt -Plan LHC/RHC as above   Bradycardia: profound bradycardia 10/13, in setting of metabolic derangements.  Resolved but remains on pressor support.  Plan to rule out ischemia as above  Afib: s/p watchman procedure.  Currently in sinus rhythm  Anemia: h/o AVMs.  Hgb stable 9.1  S/p R metatarsal amputation: p/w gangrene, s/p amputation on  10/11  CRITICAL CARE TIME: I have spent a total of 40 minutes with patient reviewing hospital notes, telemetry, EKGs, labs and examining the patient as well as establishing an assessment and plan that was discussed with the patient and the PCCM and Advanced Heart Failure teams.  > 50% of time was spent in direct patient care. The patient is critically ill with multi-organ system failure and requires high complexity decision making for assessment and support, frequent evaluation and titration of therapies, application of advanced monitoring technologies and extensive interpretation of multiple databases.  For questions or updates, please contact [facility] Please consult [email or web address] for contact info under      Signed, Lonni LITTIE Nanas, MD  08/28/2024, 8:50 AM

## 2024-08-28 NOTE — Progress Notes (Signed)
 PHARMACY - ANTICOAGULATION CONSULT NOTE  Pharmacy Consult for heparin  Indication: chest pain/ACS  Patient Measurements: Height: 6' 0.99 (185.4 cm) Weight: 96.4 kg (212 lb 8.4 oz) IBW/kg (Calculated) : 79.88 HEPARIN  DW (KG): 94.5  Vital Signs: Temp: 101.7 F (38.7 C) (10/14 1128) Temp Source: Oral (10/14 1128) BP: 103/52 (10/14 0045) Pulse Rate: 82 (10/14 0630)  Labs: Recent Labs    08/27/24 0956 08/27/24 1303 08/27/24 1814 08/27/24 1820 08/28/24 0232 08/28/24 1101  HGB 9.0* 10.9* 9.2*  --  9.1*  --   HCT 31.4* 32.0* 30.4*  --  28.6*  --   PLT 326  --  361  --  370  --   HEPARINUNFRC  --   --   --   --   --  0.17*  CREATININE 12.65*  --  12.28*  --  7.15*  --   TROPONINIHS  --   --   --  >24,000*  --   --     Estimated Creatinine Clearance: 14.1 mL/min (A) (by C-G formula based on SCr of 7.15 mg/dL (H)).  Assessment: 10 yoM presented with bradycardia/hypotension with concerns for sepsis due to osteomyelitis. PMH includes ESRD (iHD), pAF (watchman no oral AC given GI bleed hx), hx of small AVMs, pulmonary hypertnesion, HFpEF, CAD (s/p DES LAD/LCx). Pharmacy consulted to dose heparin  for ACS. -Trop >24k -Per consult no bolus  -Received [place] heparin  last dose ~2200 on 10/13  Heparin  level subtherapeutic at 0.17. No signs or symptoms of bleeding. Plan for RHC/LHC today (10/14).  CBC stable: Hgb 9.1, plts 370  Goal of Therapy:  Heparin  level 0.3-0.7 units/ml (will target lwoer end of goal given hx of GI bleed) Monitor platelets by anticoagulation protocol: Yes   Plan:  -No bolus -Increase heparin  infusion to 1400 units/hr -8h heparin  level -CBC, heparin  level daily  -F/u RHC/LHC and Cards plan  Elma Fail, PharmD PGY1 Clinical Pharmacist [facility]  08/28/2024 11:52 AM

## 2024-08-28 NOTE — Progress Notes (Addendum)
 PHARMACY - ANTICOAGULATION CONSULT NOTE  Pharmacy Consult for heparin  Indication: chest pain/ACS  Patient Measurements: Height: 6' 0.99 (185.4 cm) Weight: 96.6 kg (212 lb 15.4 oz) IBW/kg (Calculated) : 79.88 HEPARIN  DW (KG): 94.5  Vital Signs: Temp: 98.1 F (36.7 C) (10/13 2335) Temp Source: Oral (10/13 2335) BP: 101/53 (10/14 0000) Pulse Rate: 76 (10/14 0000)  Labs: Recent Labs    08/25/24 0836 08/27/24 0956 08/27/24 1303 08/27/24 1814 08/27/24 1820  HGB 9.4* 9.0* 10.9* 9.2*  --   HCT 32.4* 31.4* 32.0* 30.4*  --   PLT 329 326  --  361  --   CREATININE 7.13* 12.65*  --  12.28*  --   TROPONINIHS  --   --   --   --  >24,000*    Estimated Creatinine Clearance: 8.2 mL/min (A) (by C-G formula based on SCr of 12.28 mg/dL (H)).  Assessment: 50 yoM presented with bradycardia/hypotension with cocnerns for sepsis due to osteomyelitis. PMH includes ESRD (iHD), pAF (watchman no oral AC given GI bleed hx), hx of small AVMs, pulmonary hypertnesion, HFpEF, CAD (s/p DES LAD/LCx). Pharmacy consulted to dose heparin  for ACS.  -Hgb 9s, plts WNL -Trop >24k -Per consult no bolus  -Received [place] heparin  last dose ~2200 on 10/13  Goal of Therapy:  Heparin  level 0.3-0.7 units/ml (will target lwoer end of goal given hx of GI bleed) Monitor platelets by anticoagulation protocol: Yes   Plan:  -No bolus -Start heparin  infusion at 1100 units/hr -8h heparin  level -CBC, heparin  level daily  -F/u cards plan  Lynwood Poplar, PharmD, BCPS Clinical Pharmacist 08/28/2024 12:25 AM

## 2024-08-28 NOTE — Interval H&P Note (Signed)
 History and Physical Interval Note:  08/28/2024 6:05 PM  [name]  has presented today for surgery, with the diagnosis of nstemi/right ventricular failure.  The various methods of treatment have been discussed with the patient and family. After consideration of risks, benefits and other options for treatment, the patient has consented to  Procedure(s): RIGHT/LEFT HEART CATH AND CORONARY ANGIOGRAPHY (N/A)  PERCUTANEOUS CORONARY DIMENSION  as a surgical intervention.  The patient's history has been reviewed, patient examined, no change in status, stable for surgery.  I have reviewed the patient's chart and labs.  Questions were answered to the patient's satisfaction.      [name]

## 2024-08-28 NOTE — Progress Notes (Signed)
 Per request of cardiology, I informed nephrology of Mg and Ca replacement. Per Dr. Marlee, that does not  change dialysis treatment.

## 2024-08-29 ENCOUNTER — Other Ambulatory Visit (HOSPITAL_COMMUNITY): Payer: Self-pay

## 2024-08-29 ENCOUNTER — Encounter (HOSPITAL_COMMUNITY): Payer: Self-pay | Admitting: Cardiology

## 2024-08-29 ENCOUNTER — Inpatient Hospital Stay (HOSPITAL_COMMUNITY)

## 2024-08-29 DIAGNOSIS — I2781 Cor pulmonale (chronic): Secondary | ICD-10-CM | POA: Diagnosis not present

## 2024-08-29 DIAGNOSIS — I214 Non-ST elevation (NSTEMI) myocardial infarction: Secondary | ICD-10-CM | POA: Diagnosis not present

## 2024-08-29 DIAGNOSIS — Z1612 Extended spectrum beta lactamase (ESBL) resistance: Secondary | ICD-10-CM | POA: Diagnosis not present

## 2024-08-29 DIAGNOSIS — N186 End stage renal disease: Secondary | ICD-10-CM | POA: Diagnosis not present

## 2024-08-29 DIAGNOSIS — I50810 Right heart failure, unspecified: Secondary | ICD-10-CM | POA: Diagnosis not present

## 2024-08-29 DIAGNOSIS — I272 Pulmonary hypertension, unspecified: Secondary | ICD-10-CM | POA: Diagnosis not present

## 2024-08-29 DIAGNOSIS — R570 Cardiogenic shock: Secondary | ICD-10-CM | POA: Diagnosis not present

## 2024-08-29 LAB — GLUCOSE, CAPILLARY
Glucose-Capillary: 109 mg/dL — ABNORMAL HIGH (ref 70–99)
Glucose-Capillary: 110 mg/dL — ABNORMAL HIGH (ref 70–99)
Glucose-Capillary: 110 mg/dL — ABNORMAL HIGH (ref 70–99)
Glucose-Capillary: 117 mg/dL — ABNORMAL HIGH (ref 70–99)
Glucose-Capillary: 120 mg/dL — ABNORMAL HIGH (ref 70–99)
Glucose-Capillary: 121 mg/dL — ABNORMAL HIGH (ref 70–99)

## 2024-08-29 LAB — RENAL FUNCTION PANEL
Albumin: 2.6 g/dL — ABNORMAL LOW (ref 3.5–5.0)
Anion gap: 16 — ABNORMAL HIGH (ref 5–15)
BUN: 39 mg/dL — ABNORMAL HIGH (ref 6–20)
CO2: 21 mmol/L — ABNORMAL LOW (ref 22–32)
Calcium: 6.6 mg/dL — ABNORMAL LOW (ref 8.9–10.3)
Chloride: 92 mmol/L — ABNORMAL LOW (ref 98–111)
Creatinine, Ser: 8.38 mg/dL — ABNORMAL HIGH (ref 0.61–1.24)
GFR, Estimated: 7 mL/min — ABNORMAL LOW (ref >60)
Glucose, Bld: 115 mg/dL — ABNORMAL HIGH (ref 70–99)
Phosphorus: 4 mg/dL (ref 2.5–4.6)
Potassium: 3.9 mmol/L (ref 3.5–5.1)
Sodium: 129 mmol/L — ABNORMAL LOW (ref 135–145)

## 2024-08-29 LAB — CG4 I-STAT (LACTIC ACID)
Lactic Acid, Venous: 1.3 mmol/L (ref 0.5–1.9)
Lactic Acid, Venous: 1.6 mmol/L (ref 0.5–1.9)

## 2024-08-29 LAB — CBC WITH DIFFERENTIAL/PLATELET
Abs Immature Granulocytes: 0.1 K/uL — ABNORMAL HIGH (ref 0.00–0.07)
Basophils Absolute: 0.1 K/uL (ref 0.0–0.1)
Basophils Relative: 1 %
Eosinophils Absolute: 0.2 K/uL (ref 0.0–0.5)
Eosinophils Relative: 1 %
HCT: 27.7 % — ABNORMAL LOW (ref 39.0–52.0)
Hemoglobin: 8.9 g/dL — ABNORMAL LOW (ref 13.0–17.0)
Immature Granulocytes: 1 %
Lymphocytes Relative: 10 %
Lymphs Abs: 1.7 K/uL (ref 0.7–4.0)
MCH: 29.4 pg (ref 26.0–34.0)
MCHC: 32.1 g/dL (ref 30.0–36.0)
MCV: 91.4 fL (ref 80.0–100.0)
Monocytes Absolute: 1 K/uL (ref 0.1–1.0)
Monocytes Relative: 6 %
Neutro Abs: 14 K/uL — ABNORMAL HIGH (ref 1.7–7.7)
Neutrophils Relative %: 81 %
Platelets: 345 K/uL (ref 150–400)
RBC: 3.03 MIL/uL — ABNORMAL LOW (ref 4.22–5.81)
RDW: 17.2 % — ABNORMAL HIGH (ref 11.5–15.5)
WBC: 17.1 K/uL — ABNORMAL HIGH (ref 4.0–10.5)
nRBC: 0.1 % (ref 0.0–0.2)

## 2024-08-29 LAB — COMPREHENSIVE METABOLIC PANEL WITH GFR
ALT: 90 U/L — ABNORMAL HIGH (ref 0–44)
AST: 1820 U/L — ABNORMAL HIGH (ref 15–41)
Albumin: 2.5 g/dL — ABNORMAL LOW (ref 3.5–5.0)
Alkaline Phosphatase: 208 U/L — ABNORMAL HIGH (ref 38–126)
Anion gap: 16 — ABNORMAL HIGH (ref 5–15)
BUN: 43 mg/dL — ABNORMAL HIGH (ref 6–20)
CO2: 20 mmol/L — ABNORMAL LOW (ref 22–32)
Calcium: 6.7 mg/dL — ABNORMAL LOW (ref 8.9–10.3)
Chloride: 90 mmol/L — ABNORMAL LOW (ref 98–111)
Creatinine, Ser: 9.15 mg/dL — ABNORMAL HIGH (ref 0.61–1.24)
GFR, Estimated: 6 mL/min — ABNORMAL LOW (ref >60)
Glucose, Bld: 136 mg/dL — ABNORMAL HIGH (ref 70–99)
Potassium: 3.5 mmol/L (ref 3.5–5.1)
Sodium: 126 mmol/L — ABNORMAL LOW (ref 135–145)
Total Bilirubin: 0.9 mg/dL (ref 0.0–1.2)
Total Protein: 6.5 g/dL (ref 6.5–8.1)

## 2024-08-29 LAB — POCT ACTIVATED CLOTTING TIME
Activated Clotting Time: 175 s
Activated Clotting Time: 176 s
Activated Clotting Time: 187 s
Activated Clotting Time: 187 s
Activated Clotting Time: 181 s
Activated Clotting Time: 193 s
Activated Clotting Time: 193 s
Activated Clotting Time: >1000 s
Activated Clotting Time: 187 s
Activated Clotting Time: 193 s
Activated Clotting Time: 193 s
Activated Clotting Time: 193 s
Activated Clotting Time: 199 s
Activated Clotting Time: 187 s
Activated Clotting Time: 187 s
Activated Clotting Time: 198 s

## 2024-08-29 LAB — COOXEMETRY PANEL
Carboxyhemoglobin: 1.6 % — ABNORMAL HIGH (ref 0.5–1.5)
Methemoglobin: <0.7 % (ref 0.0–1.5)
O2 Saturation: 65.5 %
Total hemoglobin: 8.8 g/dL — ABNORMAL LOW (ref 12.0–16.0)

## 2024-08-29 LAB — TROPONIN I (HIGH SENSITIVITY): Troponin I (High Sensitivity): >24000 ng/L (ref <18)

## 2024-08-29 MED ORDER — VASOPRESSIN 20 UNITS/100 ML INFUSION FOR SHOCK
0.0000 [IU]/min | INTRAVENOUS | Status: DC
Start: 2024-08-29 — End: 2024-09-07
  Administered 2024-08-29: 0.04 [IU]/min via INTRAVENOUS
  Administered 2024-08-29: 0.03 [IU]/min via INTRAVENOUS
  Administered 2024-08-30 – 2024-08-31 (×4): 0.04 [IU]/min via INTRAVENOUS
  Administered 2024-08-31: 0.03 [IU]/min via INTRAVENOUS
  Administered 2024-08-31: 0.04 [IU]/min via INTRAVENOUS
  Administered 2024-09-01: 0.02 [IU]/min via INTRAVENOUS
  Administered 2024-09-02 – 2024-09-05 (×10): 0.03 [IU]/min via INTRAVENOUS
  Administered 2024-09-06: 0.02 [IU]/min via INTRAVENOUS
  Filled 2024-08-29 (×20): qty 100

## 2024-08-29 MED ORDER — LINEZOLID 600 MG PO TABS
600.0000 mg | ORAL_TABLET | Freq: Two times a day (BID) | ORAL | Status: AC
Start: 2024-08-29 — End: 2024-09-02
  Administered 2024-08-29 – 2024-09-02 (×10): 600 mg via ORAL
  Filled 2024-08-29 (×10): qty 1

## 2024-08-29 MED ORDER — HYDROCORTISONE SOD SUC (PF) 100 MG IJ SOLR
100.0000 mg | Freq: Two times a day (BID) | INTRAMUSCULAR | Status: DC
  Administered 2024-08-29 – 2024-08-31 (×5): 100 mg via INTRAVENOUS
  Filled 2024-08-29 (×5): qty 2

## 2024-08-29 MED ORDER — FAMOTIDINE 20 MG PO TABS
10.0000 mg | ORAL_TABLET | Freq: Every day | ORAL | Status: DC
  Administered 2024-08-29 – 2024-09-12 (×15): 10 mg via ORAL
  Filled 2024-08-29 (×15): qty 1

## 2024-08-29 MED ORDER — ALTEPLASE 2 MG IJ SOLR: 2.0000 mg | Freq: Once | INTRAMUSCULAR | Status: DC | PRN

## 2024-08-29 MED ORDER — PENTAFLUOROPROP-TETRAFLUOROETH EX AERO
1.0000 | INHALATION_SPRAY | CUTANEOUS | Status: DC | PRN
Start: 2024-08-29 — End: 2024-08-30

## 2024-08-29 MED ORDER — HEPARIN SODIUM (PORCINE) 1000 UNIT/ML DIALYSIS
1000.0000 [IU] | INTRAMUSCULAR | Status: DC | PRN
  Administered 2024-08-29: 4000 [IU] via INTRAVENOUS_CENTRAL
  Filled 2024-08-29: qty 6
  Filled 2024-08-29: qty 4
  Filled 2024-08-29: qty 5

## 2024-08-29 MED ORDER — TRAMADOL HCL 50 MG PO TABS
100.0000 mg | ORAL_TABLET | Freq: Once | ORAL | Status: AC
  Administered 2024-08-29: 100 mg via ORAL
  Filled 2024-08-29: qty 2

## 2024-08-29 MED ORDER — GERHARDT'S BUTT CREAM
TOPICAL_CREAM | Freq: Two times a day (BID) | CUTANEOUS | Status: DC
  Administered 2024-09-09 – 2024-09-11 (×2): 1 via TOPICAL
  Filled 2024-08-29 (×2): qty 60

## 2024-08-29 MED ORDER — LIDOCAINE-PRILOCAINE 2.5-2.5 % EX CREA: 1.0000 | TOPICAL_CREAM | CUTANEOUS | Status: DC | PRN

## 2024-08-29 MED ORDER — AMIODARONE HCL IN DEXTROSE 360-4.14 MG/200ML-% IV SOLN
30.0000 mg/h | INTRAVENOUS | Status: AC
  Administered 2024-08-29: 30 mg/h via INTRAVENOUS
  Filled 2024-08-29 (×2): qty 200

## 2024-08-29 MED ORDER — NOREPINEPHRINE 16 MG/250ML-% IV SOLN
0.0000 ug/min | INTRAVENOUS | Status: DC
  Administered 2024-08-29: 30 ug/min via INTRAVENOUS
  Administered 2024-08-29: 2 ug/min via INTRAVENOUS
  Administered 2024-08-30: 28 ug/min via INTRAVENOUS
  Administered 2024-08-31: 14 ug/min via INTRAVENOUS
  Administered 2024-09-01: 5 ug/min via INTRAVENOUS
  Administered 2024-09-03 – 2024-09-04 (×2): 8 ug/min via INTRAVENOUS
  Administered 2024-09-06: 3 ug/min via INTRAVENOUS
  Filled 2024-08-29 (×8): qty 250

## 2024-08-29 MED ORDER — PANTOPRAZOLE SODIUM 40 MG PO TBEC
40.0000 mg | DELAYED_RELEASE_TABLET | Freq: Every day | ORAL | Status: DC
Start: 2024-08-30 — End: 2024-09-13
  Administered 2024-08-30 – 2024-09-13 (×15): 40 mg via ORAL
  Filled 2024-08-29 (×15): qty 1

## 2024-08-29 MED ORDER — PRISMASOL BGK 4/2.5 32-4-2.5 MEQ/L EC SOLN: Status: DC

## 2024-08-29 MED ORDER — HEPARIN SODIUM (PORCINE) 1000 UNIT/ML DIALYSIS
1000.0000 [IU] | INTRAMUSCULAR | Status: DC | PRN
Start: 2024-08-29 — End: 2024-08-29

## 2024-08-29 MED ORDER — NEPRO/CARBSTEADY PO LIQD: 237.0000 mL | ORAL | Status: DC | PRN

## 2024-08-29 MED ORDER — SODIUM CHLORIDE 0.9 % IV SOLN
1.0000 g | Freq: Three times a day (TID) | INTRAVENOUS | Status: AC
  Administered 2024-08-29 – 2024-09-02 (×15): 1 g via INTRAVENOUS
  Filled 2024-08-29 (×15): qty 20

## 2024-08-29 MED ORDER — LIDOCAINE HCL (PF) 1 % IJ SOLN: 5.0000 mL | INTRAMUSCULAR | Status: DC | PRN

## 2024-08-29 MED ORDER — ANTICOAGULANT SODIUM CITRATE 4% (200MG/5ML) IV SOLN: 5.0000 mL | Status: DC | PRN

## 2024-08-29 MED ORDER — POTASSIUM CHLORIDE 10 MEQ/50ML IV SOLN
10.0000 meq | Freq: Once | INTRAVENOUS | Status: AC
  Administered 2024-08-29: 10 meq via INTRAVENOUS
  Filled 2024-08-29: qty 50

## 2024-08-29 NOTE — Progress Notes (Signed)
 PT Cancellation and Discharge Note  Patient Details Name: [name] MRN: [id number] DOB: Oct 11, 1966   Cancelled Treatment:    Reason Eval/Treat Not Completed: Medical issues which prohibited therapy;Patient not medically ready (Pt currently declining. Starting CRRT today with femoral sheath with all lines in femoral sheath per RN. Will sign off at this time. Please re-consult when pt is medically appropriate.)  [name], DPT, CLT  Acute Rehabilitation Services Office: [phone] (Secure chat preferred)   Dorothyann VEAR Maier 08/29/2024, 11:54 AM

## 2024-08-29 NOTE — Progress Notes (Signed)
 [facility] Progress Note  Subjective:  A bit more sedated today I/O yest was 2.2 L in and 1.3 L out (HD) K+ today is pending CVP 18- 25  Vaso 0.03, levo 12-20 micrograms/min Epi gtt at 6 mcg CXR yest / today -->  bilat opacities , poss edema, +vasc congestion   Vitals:   08/29/24 1130 08/29/24 1200 08/29/24 1212 08/29/24 1230  BP:      Pulse: 83 (!) 112 (!) 111 (!) 111  Resp: (!) 31 (!) 26 (!) 32 (!) 35  Temp: 99 F (37.2 C) 99 F (37.2 C) 99 F (37.2 C) 99 F (37.2 C)  TempSrc:  Core    SpO2: 100% 96% 93% 92%  Weight:      Height:        Exam: General: lying flat, curled up in blankets, Ox 3 Neck: JVD not elevated. Lungs: Clear bilaterally to auscultation  Heart: RRR with normal S1, S2. No RG.  Abdomen: Soft, non-tender, non-distended  Lower extremities: No edema; R foot in hard boot Neuro: Alert and oriented X 3. 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 Recent bacteremia. Per ID, should continue Erta thru 10/14.  ESRD: on HD MWF. Last HD here was early Tuesday am. With pressor requirements, pulm edema by CXR and ^^CVP, will need CRRT. Have d/w CCM.   Volume: 2-3 kg up today. UF 50-100 cc/hr as tolerated to start.   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 ESA to be re-dosed as outpatient. Secondary hyperparathyroidism: Ca ok. Cont home meds  CAD  A-fib  Rob Geralynn MD  [facility] 08/29/2024, 1:03 PM  Recent Labs  Lab 08/28/24 0232 08/28/24 1101 08/28/24 1447 08/28/24 2107 08/29/24 0344  HGB 9.1*  --   --  8.6* 8.9*  ALBUMIN   --   --  2.6*  --  2.5*  CALCIUM  7.7*  --  7.0*  --  6.7*  PHOS 4.2  --  3.8  --   --   CREATININE 7.15*  --  8.63* 8.78* 9.15*  K 3.3*   < > 3.7 3.3* 3.5   < > = values in this interval not displayed.    No results for input(s): IRON , TIBC, FERRITIN in the last 168 hours. Inpatient medications:  allopurinol  100 mg Oral q AM   aspirin  EC  81 mg Oral Daily   atorvastatin   80 mg Oral QHS   calcitRIOL   1 mcg Oral Q M,W,F-HD   Chlorhexidine  Gluconate Cloth  6 each Topical Q0600   Chlorhexidine  Gluconate Cloth  6 each Topical Q0600   clopidogrel   75 mg Oral Q breakfast   docusate sodium   100 mg Oral BID   famotidine   20 mg Oral QHS   ferric citrate   420 mg Oral TID WC   [name]   Topical BID   heparin   5,000 Units Subcutaneous Q8H   hydrocortisone  sod succinate (SOLU-CORTEF ) inj  100 mg Intravenous Q12H   linezolid   600 mg Oral Q12H   oxidized cellulose  1 each Topical Once   [START ON 08/30/2024] pantoprazole   40 mg Oral Daily   senna  1 tablet Oral BID   sodium chloride  flush  3 mL Intravenous Q12H  tamsulosin   0.4 mg Oral QPM    sodium chloride  10 mL/hr at 08/29/24 1200   sodium chloride      sodium chloride  10 mL/hr at 08/29/24 1200   sodium chloride  Stopped (08/29/24 9166)   anticoagulant sodium citrate      epinephrine  6 mcg/min (08/29/24 1249)   meropenem (MERREM) IV Stopped (08/29/24 1027)   norepinephrine  (LEVOPHED ) Adult infusion 18 mcg/min (08/29/24 1200)   prismasol BGK 4/2.5     prismasol BGK 4/2.5     prismasol BGK 4/2.5     vasopressin  0.03 Units/min (08/29/24 1200)   sodium chloride , sodium chloride , acetaminophen , alteplase , anticoagulant sodium citrate , bisacodyl, feeding supplement (NEPRO CARB STEADY), heparin , HYDROmorphone  (DILAUDID ) injection, lidocaine  (PF), lidocaine -prilocaine , pentafluoroprop-tetrafluoroeth, polyethylene glycol, sodium chloride  flush

## 2024-08-29 NOTE — Consult Note (Addendum)
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 overload with CVP 19.  - Start CVVH today, aim for UF net negative 100-150 cc/hr.  5. Atrial arrhythmias: Patient has history of paroxysmal atrial fibrillation and has a Watchman in place.  Currently, he appears to be in a regular atrial tachycardia vs atypical atrial flutter with RVR.   - Given RVR, I will start amiodarone  gtt 30 mg/hr for rate control.  Stop if he re-develops bradycardia.  6. Bradycardia: Patient had transient bradycardia in setting of hypotension and mental status changes on 10/13.  Follow telemetry closely for recurrence.  7. CAD: NSTEMI with TnI > 24000 on 10/13.  He had no chest pain.  LHC showed 99% in-stent restenosis in the mid LAD treated with DES.  He had severe OM disease as well, managed medically.  No significant RCA disease.  I cannot explain his failed RV by CAD.  - Continue ASA 81 and Plavix  75 - Continue atorvastatin  80.  8. ID: Recent ESBL E coli  bacteremia, recent TMA right foot for gangrene. He has been hypotensive with SVR 690, suspect component of septic/distributive shock. PCT was high even in setting of ESRD (46).  - Continue linezolid  + Meropenem.   CRITICAL CARE Performed by: Ezra Shuck  Total critical care time: 75 minutes  Critical care time was exclusive  of separately billable procedures and treating other patients.  Critical care was necessary to treat or prevent imminent or life-threatening deterioration.  Critical care was time spent personally by me on the following activities: development of treatment plan with patient and/or surrogate as well as nursing, discussions with consultants, evaluation of patient's response to treatment, examination of patient, obtaining history from patient or surrogate, ordering and performing treatments and interventions, ordering and review of laboratory studies, ordering and review of radiographic studies, pulse oximetry and re-evaluation of patient's condition.  Ezra Shuck 08/29/2024 2:48 PM

## 2024-08-29 NOTE — Progress Notes (Signed)
 Initial Nutrition Assessment  DOCUMENTATION CODES:   Not applicable  INTERVENTION:  Diet renal with fluid restriction Fluid restriction: 1200 mL  Magic cup BID with meals, each supplement provides 290 kcal and 9 grams of protein Encourage PO intake   NUTRITION DIAGNOSIS:   Increased nutrient needs related to catabolic illness as evidenced by  (pt with ESRD on HD at baseline, now on CRRT).  GOAL:   Patient will meet greater than or equal to 90% of their needs  MONITOR:   PO intake, Supplement acceptance, Labs  REASON FOR ASSESSMENT:   Consult Assessment of nutrition requirement/status  ASSESSMENT:   PMH significant for ESRD on iHD (MWF), chronic hypotension on Midodrine , hx of small AVMs, paroxysmal a. Fib s/p watchman not on [facility] who presented to the orthopedic clinic for R foot gangrene with a recent hx of ESBL Bacteremia and sepsis presumed to be due to osteomyelitis now s/p R transmetatarsal amputation on 08/25/2024.  10/15 - developed some confusion, bradycardia, and hypotension requiring atropine administration and vasopressors; moved to ICU for hemodynamic monitoring and further care    Patient seen in room. Currently on multiple pressors, started on CRRT this morning (on HD at baseline). Pt was NPO during breakfast and then started on renal diet, did not eat chicken breast at lunch but did eat vegetable sides. Pt report good appetite at home but consumes smaller portions  of meals. Typically does coffee and crackers for breakfast, a sandwich and protein bar for lunch and dinner will vary depending on who is cooking. Discussed with pt increased needs for protein on HD and even more while on CRRT. Pt does not like ensure or nepro drinks but willing to try magic cups. Reports dry weight of 94.5 kg, denies recent weight changes outside of fluid from HD.   Meds: Calcitriol , colace, protonix , senokot  Drips: Epi @ 12 mcg/min, levo @ 18 mcg/min, Vaso @ 0.03   Labs: Sodium 126,  BUN 43, Cr 9.15, Calcium  6.7 (corrected 7.9) GFR 6    Intake/Output Summary (Last 24 hours) at 08/29/2024 1617 Last data filed at 08/29/2024 1500 Gross per 24 hour  Intake 2424.46 ml  Output -0.7 ml  Net 2425.16 ml        Wt Readings from Last 10 Encounters:  08/28/24 96.4 kg  08/17/24 94.5 kg  07/03/24 96.5 kg  03/28/24 94.8 kg  02/21/24 95.6 kg  12/08/23 95 kg  08/25/23 94.7 kg  03/22/23 95 kg  03/17/23 95 kg  01/25/23 100.6 kg    NUTRITION - FOCUSED PHYSICAL EXAM:  Flowsheet Row Most Recent Value  Orbital Region No depletion  Upper Arm Region No depletion  Thoracic and Lumbar Region No depletion  Buccal Region No depletion  Temple Region No depletion  Clavicle Bone Region No depletion  Clavicle and Acromion Bone Region No depletion  Scapular Bone Region No depletion  Dorsal Hand No depletion  Patellar Region No depletion  Anterior Thigh Region No depletion  Posterior Calf Region No depletion  Hair Reviewed  Eyes Reviewed  Mouth Reviewed  Skin Reviewed  Nails Reviewed    Diet Order:   Diet Order             Diet renal with fluid restriction Fluid restriction: 1200 mL Fluid; Room service appropriate? Yes; Fluid consistency: Thin  Diet effective now                   EDUCATION NEEDS:   Education needs have been addressed  Skin:  Skin Assessment: Reviewed RN Assessment  Last BM:  10/14 type 6  Height:   Ht Readings from Last 1 Encounters:  08/25/24 6' 0.99 (1.854 m)    Weight:   Wt Readings from Last 1 Encounters:  08/28/24 96.4 kg    Ideal Body Weight:  80.9 kg  BMI:  Body mass index is 28.05 kg/m.  Estimated Nutritional Needs:   Kcal:  2400-2900 kcals  Protein:  145-192 gm  Fluid:  1.2L/d  Verneda Hollopeter, MS, RD, LDN Clinical Dietitian  Contact via secure chat. If unavailable, use group chat RD Inpatient.

## 2024-08-29 NOTE — Progress Notes (Signed)
 NAME:  [name], MRN:  [id number], DOB:  1966/02/15, LOS: 1 ADMISSION DATE:  08/25/2024, CONSULTATION DATE:  08/27/24 REFERRING MD:  Dr. Camellia Door, CHIEF COMPLAINT:  bradycardia, hypotension   History of Present Illness:  [name] is a 58 y/o M with a PMH significant for ESRD on iHD (MWF), chronic hypotension on Midodrine , hx of small AVMs, paroxysmal a. Fib s/p watchman not on [facility] who presented to the orthopedic clinic for R foot gangrene with a recent hx of ESBL Bacteremia and sepsis presumed to be due to osteomyelitis now s/p R transmetatarsal amputation on 08/25/2024. This AM he developed some confusion, bradycardia, and hypotension requiring atropine administration and vasopressors. The patient was moved to ICU for hemodynamic monitoring and further care   Pertinent  Medical History  As above  Significant Hospital Events: Including procedures, antibiotic start and stop dates in addition to other pertinent events   08/25/2024 admission to the hospital for R transmetatarsal amputation 08/27/2024 admitted to ICU for hypotension and bradycardia, CTA PE negative. TTE with severely enlarged R ventricle, new. NSTEMI with troponin >24K; cardiology started on heparin  gtt and ASA 10/14 underwent cardiac cath and LAD stenting, remain on epinephrine  and norepinephrine    Interim History / Subjective:  Patient continued to require epinephrine  and norepinephrine  Remained afebrile Denies chest pain or shortness of breath  Objective   Blood pressure (!) 99/17, pulse (!) 119, temperature 98.8 F (37.1 C), resp. rate (!) 25, height 6' 0.99 (1.854 m), weight 96.4 kg, SpO2 100%. CVP:  [20 mmHg-36 mmHg] 35 mmHg CO:  [4.7 L/min-5 L/min] 5 L/min CI:  [2.14 L/min/m2-2.25 L/min/m2] 2.25 L/min/m2      Intake/Output Summary (Last 24 hours) at 08/29/2024 0817 Last data filed at 08/29/2024 0700 Gross per 24 hour  Intake 1989.58 ml  Output --  Net 1989.58 ml   Filed Weights   08/25/24 0734  08/27/24 1141 08/28/24 0428  Weight: 94.5 kg 96.6 kg 96.4 kg    Examination: General: Acute on chronically ill-appearing male, lying on the bed HEENT: [place]/AT, eyes anicteric.  moist mucus membranes Neuro: Alert, awake following commands Chest: Coarse breath sounds, no wheezes or rhonchi Heart: Tachycardic, regular rhythm, no murmurs or gallops Abdomen: Soft, nontender, nondistended, bowel sounds present Extremities: Right lower extremity forefoot is amputated, well-dressed, no discharge noted  Labs and images reviewed  Patient Lines/Drains/Airways Status     Active Line/Drains/Airways     Name Placement date Placement time Site Days   Arterial Line 08/27/24 Right Femoral 08/27/24  1200  Femoral  2   CVC Triple Lumen 08/27/24 Left Femoral 08/27/24  1500  -- 2   Fistula / Graft Left Forearm Arteriovenous fistula --  --  Forearm  --   Sheath 08/28/24 Left Femoral 08/28/24  1950  Femoral  1   Midline Single Lumen 08/17/24 Right Cephalic 8 cm 0 cm 08/17/24  1541  Cephalic  12   Negative Pressure Wound Therapy Leg Left;Lower 11/30/22  1518  --  638   Negative Pressure Wound Therapy Leg Left;Lower;Anterior 03/22/23  1427  --  526   Pulmonary Artery Catheter 08/28/24 Right 08/28/24  1949  -- 1   Wound 08/12/24 2300 Atypical Toe (Comment  which one) Anterior;Right 08/12/24  2300  Toe (Comment  which one)  17   Wound 08/25/24 1330 Other (Comment) Toe (Comment  which one) Anterior;Left 08/25/24  1330  Toe (Comment  which one)  4         Resolved Hospital Problem  list   Hypoglycemia  Assessment & Plan:  Acute on chronic cor pulmonale with cardiogenic shock Acute NSTEMI status post LAD stent Severe pulmonary hypertension Mixed cardiogenic and septic shock Symptomatic bradycardia, resolved Paroxysmal A-fib status post Watchman device QTc prolongation Recent history of ESBL E. coli bacteremia and right forefoot osteomyelitis status post amputation End-stage renal disease on  hemodialysis lactic acidosis recurrent hypoglycemia, resolved  Appreciate cardiology follow-up Patient is on epinephrine  and norepinephrine  Will add vasopressin  and try to titrate down epinephrine  Continue aspirin  and Plavix  and statin IV heparin  infusion was stopped GDMT is limited by shock Bradycardia has resolved now he is tachycardic Remains in sinus rhythm Continue telemetry monitoring His procalcitonin is elevated to 45, in the setting of end-stage renal disease it could be elevated but not to that extent His SVR is low which is consistent with septic picture Blood cultures have been negative Started on IV antibiotics with Zyvox  and meropenem, especially considering recent history of ESBL E. coli bacteremia Nephrology is following, plan to start CRRT considering vasopressor requirement and volume overload   Labs   CBC: Recent Labs  Lab 08/27/24 0956 08/27/24 1303 08/27/24 1814 08/28/24 0232 08/28/24 2107 08/29/24 0344  WBC 15.8*  --  14.2* 12.6* 16.2* 17.1*  NEUTROABS 12.7*  --   --   --   --  14.0*  HGB 9.0* 10.9* 9.2* 9.1* 8.6* 8.9*  HCT 31.4* 32.0* 30.4* 28.6* 27.4* 27.7*  MCV 102.6*  --  94.7 92.0 91.9 91.4  PLT 326  --  361 370 346 345    Basic Metabolic Panel: Recent Labs  Lab 08/27/24 0956 08/27/24 1303 08/27/24 1354 08/27/24 1814 08/27/24 2024 08/28/24 0232 08/28/24 1101 08/28/24 1447 08/28/24 2107 08/29/24 0344  NA 138 134*  --  132*  --  132*  --  129*  --  126*  K 5.8* 5.1   < > 4.4   < > 3.3* 3.5 3.7 3.3* 3.5  CL 97*  --   --  95*  --  91*  --  90*  --  90*  CO2 11*  --   --  17*  --  22  --  21*  --  20*  GLUCOSE 46*  --   --  142*  --  145*  --  149*  --  136*  BUN 49*  --   --  56*  --  28*  --  37*  --  43*  CREATININE 12.65*  --   --  12.28*  --  7.15*  --  8.63* 8.78* 9.15*  CALCIUM  7.4*  --   --  6.9*  --  7.7*  --  7.0*  --  6.7*  MG  --   --   --  2.0  --  2.4  --   --   --   --   PHOS  --   --   --  5.2*  --  4.2  --  3.8  --   --     < > = values in this interval not displayed.   GFR: Estimated Creatinine Clearance: 11 mL/min (A) (by C-G formula based on SCr of 9.15 mg/dL (H)). Recent Labs  Lab 08/27/24 1814 08/27/24 1820 08/27/24 2024 08/28/24 0232 08/28/24 1101 08/28/24 1447 08/28/24 2107 08/29/24 0344  PROCALCITON  --   --   --   --  45.77  --   --   --   WBC 14.2*  --   --  12.6*  --   --  16.2* 17.1*  LATICACIDVEN  --  5.8* 4.2*  --  3.1* 3.3*  --   --     Liver Function Tests: Recent Labs  Lab 08/27/24 1814 08/28/24 1447 08/29/24 0344  AST 287*  --  1,820*  ALT 30  --  90*  ALKPHOS 174*  --  208*  BILITOT 0.8  --  0.9  PROT 6.4*  --  6.5  ALBUMIN  2.6* 2.6* 2.5*   Recent Labs  Lab 08/28/24 1101  LIPASE 28   No results for input(s): AMMONIA in the last 168 hours.  ABG    Component Value Date/Time   PHART 7.260 (L) 08/27/2024 1303   PCO2ART 26.1 (L) 08/27/2024 1303   PO2ART 95 08/27/2024 1303   HCO3 11.7 (L) 08/27/2024 1303   TCO2 12 (L) 08/27/2024 1303   ACIDBASEDEF 14.0 (H) 08/27/2024 1303   O2SAT 96 08/27/2024 1303     Coagulation Profile: No results for input(s): INR, PROTIME in the last 168 hours.  Cardiac Enzymes: No results for input(s): CKTOTAL, CKMB, CKMBINDEX, TROPONINI in the last 168 hours.  HbA1C: Hgb A1c MFr Bld  Date/Time Value Ref Range Status  03/31/2021 05:38 AM 5.6 4.8 - 5.6 % Final    Comment:    (NOTE) Pre diabetes:          5.7%-6.4%  Diabetes:              >6.4%  Glycemic control for   <7.0% adults with diabetes     CBG: Recent Labs  Lab 08/28/24 1132 08/28/24 1509 08/28/24 2328 08/29/24 0406 08/29/24 0731  GLUCAP 148* 136* 147* 120* 117*    The patient is critically ill due to acute NSTEMI/cardiogenic shock.  Critical care was necessary to treat or prevent imminent or life-threatening deterioration.  Critical care was time spent personally by me on the following activities: development of treatment plan with patient  and/or surrogate as well as nursing, discussions with consultants, evaluation of patient's response to treatment, examination of patient, obtaining history from patient or surrogate, ordering and performing treatments and interventions, ordering and review of laboratory studies, ordering and review of radiographic studies, pulse oximetry, re-evaluation of patient's condition and participation in multidisciplinary rounds.   During this encounter critical care time was devoted to patient care services described in this note for 43 minutes.     Valinda Novas, MD [facility] See Amion for pager If no response to pager, please call [phone] until 7pm After 7pm, Please call E-link [phone]

## 2024-08-29 NOTE — Procedures (Addendum)
 I have reviewed the CRRT procedure and made adjustments as needed.  Myer Fret MD  [facility] 08/29/2024, 5:31 PM

## 2024-08-29 NOTE — Consult Note (Signed)
 WOC Nurse Consult Note: Reason for Consult: Bilateral buttock DTIs  Wound type: suspected DTPI (deep tissue pressure injuries) Pressure Injury POA: No Measurement: see nursing flow sheets Wound bed: dark purple  Drainage (amount, consistency, odor)  none Periwound: see nursing flow sheets Dressing procedure/placement/frequency: Each buttock has a linear dark purple lesion present, unclear if this was related to a medical device.  [place] with staff to determine etiology. Cleanse with saline, apply single layer of xeroform and top with foam. Change foam every 3 days and xeroform daily to assess for acute changes in the wound bed.   Yanira Tolsma [facility], CNS, CWON-AP [phone]   WOC Nurse team will follow with you and see patient within 10 days for wound assessments.  Please notify WOC nurses of any acute changes in the wounds or any new areas of concern Sandy Haye [facility] MSN, RN,CWOCN, CNS, CWON-AP [phone]

## 2024-08-30 ENCOUNTER — Inpatient Hospital Stay (HOSPITAL_COMMUNITY)

## 2024-08-30 ENCOUNTER — Inpatient Hospital Stay: Admitting: Internal Medicine

## 2024-08-30 DIAGNOSIS — Z1612 Extended spectrum beta lactamase (ESBL) resistance: Secondary | ICD-10-CM | POA: Diagnosis not present

## 2024-08-30 DIAGNOSIS — I50810 Right heart failure, unspecified: Secondary | ICD-10-CM | POA: Diagnosis not present

## 2024-08-30 DIAGNOSIS — I214 Non-ST elevation (NSTEMI) myocardial infarction: Secondary | ICD-10-CM | POA: Diagnosis not present

## 2024-08-30 DIAGNOSIS — I272 Pulmonary hypertension, unspecified: Secondary | ICD-10-CM | POA: Diagnosis not present

## 2024-08-30 DIAGNOSIS — N186 End stage renal disease: Secondary | ICD-10-CM | POA: Diagnosis not present

## 2024-08-30 DIAGNOSIS — I2781 Cor pulmonale (chronic): Secondary | ICD-10-CM | POA: Diagnosis not present

## 2024-08-30 DIAGNOSIS — D631 Anemia in chronic kidney disease: Secondary | ICD-10-CM

## 2024-08-30 DIAGNOSIS — R570 Cardiogenic shock: Secondary | ICD-10-CM | POA: Diagnosis not present

## 2024-08-30 DIAGNOSIS — E871 Hypo-osmolality and hyponatremia: Secondary | ICD-10-CM

## 2024-08-30 LAB — LIPID PANEL
Cholesterol: 100 mg/dL (ref 0–200)
HDL: 34 mg/dL — ABNORMAL LOW
LDL Cholesterol: 46 mg/dL (ref 0–99)
Total CHOL/HDL Ratio: 2.9 ratio
Triglycerides: 101 mg/dL
VLDL: 20 mg/dL (ref 0–40)

## 2024-08-30 LAB — RENAL FUNCTION PANEL
Albumin: 2.6 g/dL — ABNORMAL LOW (ref 3.5–5.0)
Albumin: 2.7 g/dL — ABNORMAL LOW (ref 3.5–5.0)
Anion gap: 16 — ABNORMAL HIGH (ref 5–15)
Anion gap: 15 (ref 5–15)
BUN: 30 mg/dL — ABNORMAL HIGH (ref 6–20)
BUN: 25 mg/dL — ABNORMAL HIGH (ref 6–20)
CO2: 19 mmol/L — ABNORMAL LOW (ref 22–32)
CO2: 21 mmol/L — ABNORMAL LOW (ref 22–32)
Calcium: 7.1 mg/dL — ABNORMAL LOW (ref 8.9–10.3)
Calcium: 7.3 mg/dL — ABNORMAL LOW (ref 8.9–10.3)
Chloride: 94 mmol/L — ABNORMAL LOW (ref 98–111)
Chloride: 96 mmol/L — ABNORMAL LOW (ref 98–111)
Creatinine, Ser: 5.75 mg/dL — ABNORMAL HIGH (ref 0.61–1.24)
Creatinine, Ser: 4.47 mg/dL — ABNORMAL HIGH (ref 0.61–1.24)
GFR, Estimated: 11 mL/min — ABNORMAL LOW (ref >60)
GFR, Estimated: 15 mL/min — ABNORMAL LOW (ref >60)
Glucose, Bld: 134 mg/dL — ABNORMAL HIGH (ref 70–99)
Glucose, Bld: 147 mg/dL — ABNORMAL HIGH (ref 70–99)
Phosphorus: 3.5 mg/dL (ref 2.5–4.6)
Phosphorus: 2.9 mg/dL (ref 2.5–4.6)
Potassium: 4.6 mmol/L (ref 3.5–5.1)
Potassium: 4 mmol/L (ref 3.5–5.1)
Sodium: 129 mmol/L — ABNORMAL LOW (ref 135–145)
Sodium: 132 mmol/L — ABNORMAL LOW (ref 135–145)

## 2024-08-30 LAB — POCT ACTIVATED CLOTTING TIME
Activated Clotting Time: 187 s
Activated Clotting Time: 187 s
Activated Clotting Time: 187 s
Activated Clotting Time: 193 s
Activated Clotting Time: 193 s
Activated Clotting Time: 193 s
Activated Clotting Time: 193 s
Activated Clotting Time: 193 s
Activated Clotting Time: 199 s
Activated Clotting Time: 199 s

## 2024-08-30 LAB — HEMOGLOBIN A1C
Hgb A1c MFr Bld: 5.7 % — ABNORMAL HIGH (ref 4.8–5.6)
Mean Plasma Glucose: 116.89 mg/dL

## 2024-08-30 LAB — GLUCOSE, CAPILLARY
Glucose-Capillary: 106 mg/dL — ABNORMAL HIGH (ref 70–99)
Glucose-Capillary: 146 mg/dL — ABNORMAL HIGH (ref 70–99)
Glucose-Capillary: 129 mg/dL — ABNORMAL HIGH (ref 70–99)
Glucose-Capillary: 142 mg/dL — ABNORMAL HIGH (ref 70–99)
Glucose-Capillary: 129 mg/dL — ABNORMAL HIGH (ref 70–99)

## 2024-08-30 LAB — CBC
HCT: 28.2 % — ABNORMAL LOW (ref 39.0–52.0)
Hemoglobin: 8.9 g/dL — ABNORMAL LOW (ref 13.0–17.0)
MCH: 29.1 pg (ref 26.0–34.0)
MCHC: 31.6 g/dL (ref 30.0–36.0)
MCV: 92.2 fL (ref 80.0–100.0)
Platelets: 342 K/uL (ref 150–400)
RBC: 3.06 MIL/uL — ABNORMAL LOW (ref 4.22–5.81)
RDW: 17.3 % — ABNORMAL HIGH (ref 11.5–15.5)
WBC: 13.7 K/uL — ABNORMAL HIGH (ref 4.0–10.5)
nRBC: 0.3 % — ABNORMAL HIGH (ref 0.0–0.2)

## 2024-08-30 LAB — COOXEMETRY PANEL
Carboxyhemoglobin: 1.4 % (ref 0.5–1.5)
Carboxyhemoglobin: 0.9 % (ref 0.5–1.5)
Methemoglobin: <0.7 % (ref 0.0–1.5)
Methemoglobin: <0.7 % (ref 0.0–1.5)
O2 Saturation: 46 %
O2 Saturation: 49.2 %
Total hemoglobin: 8.6 g/dL — ABNORMAL LOW (ref 12.0–16.0)
Total hemoglobin: 9.2 g/dL — ABNORMAL LOW (ref 12.0–16.0)

## 2024-08-30 LAB — MAGNESIUM: Magnesium: 2.3 mg/dL (ref 1.7–2.4)

## 2024-08-30 LAB — ECHOCARDIOGRAM LIMITED
Height: 72.992 in
S' Lateral: 5 cm
Weight: 3629.65 [oz_av]

## 2024-08-30 LAB — LIPOPROTEIN A (LPA): Lipoprotein (a): 241.9 nmol/L — ABNORMAL HIGH (ref <75.0)

## 2024-08-30 MED ORDER — HEPARIN SODIUM (PORCINE) 1000 UNIT/ML DIALYSIS: 1000.0000 [IU] | INTRAMUSCULAR | Status: DC | PRN

## 2024-08-30 MED ORDER — ANTICOAGULANT SODIUM CITRATE 4% (200MG/5ML) IV SOLN: 5.0000 mL | Status: DC | PRN

## 2024-08-30 MED ORDER — LIDOCAINE-PRILOCAINE 2.5-2.5 % EX CREA: 1.0000 | TOPICAL_CREAM | CUTANEOUS | Status: DC | PRN

## 2024-08-30 MED ORDER — LIDOCAINE HCL (PF) 1 % IJ SOLN: 5.0000 mL | INTRAMUSCULAR | Status: DC | PRN

## 2024-08-30 MED ORDER — LIDOCAINE-EPINEPHRINE (PF) 1.5 %-1:200000 IJ SOLN
INTRAMUSCULAR | Status: DC | PRN
  Administered 2024-08-25: 5 mL via PERINEURAL

## 2024-08-30 MED ORDER — DOBUTAMINE-DEXTROSE 4-5 MG/ML-% IV SOLN
1.0000 ug/kg/min | INTRAVENOUS | Status: DC
  Administered 2024-08-30 – 2024-09-02 (×2): 2.5 ug/kg/min via INTRAVENOUS
  Administered 2024-09-05: 1 ug/kg/min via INTRAVENOUS
  Filled 2024-08-30 (×3): qty 250

## 2024-08-30 MED ORDER — ALTEPLASE 2 MG IJ SOLR: 2.0000 mg | Freq: Once | INTRAMUSCULAR | Status: DC | PRN

## 2024-08-30 MED ORDER — PENTAFLUOROPROP-TETRAFLUOROETH EX AERO: 1.0000 | INHALATION_SPRAY | CUTANEOUS | Status: DC | PRN

## 2024-08-30 MED ORDER — CHLORHEXIDINE GLUCONATE CLOTH 2 % EX PADS
6.0000 | MEDICATED_PAD | Freq: Every day | CUTANEOUS | Status: DC
  Administered 2024-08-30 – 2024-09-12 (×14): 6 via TOPICAL

## 2024-08-30 MED ORDER — OLANZAPINE 10 MG IM SOLR
5.0000 mg | Freq: Once | INTRAMUSCULAR | Status: AC | PRN
  Administered 2024-08-31: 5 mg via INTRAMUSCULAR
  Filled 2024-08-30: qty 10

## 2024-08-30 MED ORDER — AMIODARONE HCL 200 MG PO TABS
200.0000 mg | ORAL_TABLET | Freq: Two times a day (BID) | ORAL | Status: DC
  Administered 2024-08-30 – 2024-09-09 (×21): 200 mg via ORAL
  Filled 2024-08-30 (×21): qty 1

## 2024-08-30 MED ORDER — BUPIVACAINE-EPINEPHRINE (PF) 0.5% -1:200000 IJ SOLN
INTRAMUSCULAR | Status: DC | PRN
  Administered 2024-08-25: 25 mL via PERINEURAL

## 2024-08-30 MED ORDER — TRAMADOL HCL 50 MG PO TABS
50.0000 mg | ORAL_TABLET | Freq: Two times a day (BID) | ORAL | Status: DC | PRN
  Administered 2024-08-30: 50 mg via ORAL
  Filled 2024-08-30: qty 1

## 2024-08-30 NOTE — Progress Notes (Signed)
 6 fr. Arterial sheath removed at 1255. Patient's BP was monitored by a line.  Left PT pulse was found by doppler. Patient remained hemodynamically stable throughout. Pressure was held for 30 min.  PT pulse remained intact after 30 min.  Patient was educated on safety precautions and bedrest.

## 2024-08-30 NOTE — Anesthesia Postprocedure Evaluation (Addendum)
 Anesthesia Post Note  Patient: [name]  Procedure(s) Performed: AMPUTATION, FOOT, TRANSMETATARSAL (Right: Toe) LENGTHENING, TENDON, ACHILLES (Right)     Patient location during evaluation: PACU Anesthesia Type: MAC and Regional Level of consciousness: awake and alert Pain management: pain level controlled Vital Signs Assessment: post-procedure vital signs reviewed and stable Respiratory status: spontaneous breathing, nonlabored ventilation and respiratory function stable Cardiovascular status: stable and blood pressure returned to baseline Postop Assessment: no apparent nausea or vomiting Anesthetic complications: no   No notable events documented.          Madalin Hughart

## 2024-08-30 NOTE — Progress Notes (Signed)
 eLink Physician-Brief Progress Note Patient Name: [name] DOB: 12-16-1965 MRN: [id number]   Date of Service  08/30/2024  HPI/Events of Note  Patient with agitated delirium involving some hallucinating.  eICU Interventions  Zyprexa 5 mg IM x 1 ordered.        Blaze Nylund U [place] Stites 08/30/2024, 11:54 PM

## 2024-08-30 NOTE — Anesthesia Procedure Notes (Signed)
 Anesthesia Regional Block: Popliteal block   Pre-Anesthetic Checklist: , timeout performed,  Correct Patient, Correct Site, Correct Laterality,  Correct Procedure, Correct Position, site marked,  Risks and benefits discussed,  Surgical consent,  Pre-op evaluation,  At surgeon's request and post-op pain management  Laterality: Right and Lower  Prep: chloraprep       Needles:  Injection technique: Single-shot      Needle Length: 9cm  Needle Gauge: 22     Additional Needles: Arrow StimuQuik ECHO Echogenic Stimulating PNB Needle  Procedures:,,,, ultrasound used (permanent image in chart),,    Narrative:  Start time: 08/25/2024 10:04 AM End time: 08/25/2024 10:14 AM Injection made incrementally with aspirations every 5 mL.  Performed by: Personally  Anesthesiologist: Leopoldo Bruckner, MD

## 2024-08-30 NOTE — Progress Notes (Addendum)
 Advanced Heart Failure Rounding Note  Cardiologist: None  Chief Complaint: Cardiogenic shock with RV failure post NSTEMI  Subjective:    Started on CRRT yesterday. CVP down to 14 today.   Currently on NE @18  and vaso @ 0.03. Off epi.   Co-ox 46%. Repeat 49%. CVP 14.   Feels great. Breathing continues to improved. Denies CP/SOB.   Objective:   Weight Range: 102.9 kg Body mass index is 29.94 kg/m.   Vital Signs:   Temp:  [98 F (36.7 C)-99 F (37.2 C)] 98 F (36.7 C) (10/16 0804) Pulse Rate:  [63-128] 65 (10/16 1000) Resp:  [0-43] 27 (10/16 1000) SpO2:  [63 %-100 %] 96 % (10/16 1000) Arterial Line BP: (82-283)/(40-80) 111/55 (10/16 1000) Weight:  [102.9 kg] 102.9 kg (10/16 0500) Last BM Date : 08/30/24  Weight change: Filed Weights   08/27/24 1141 08/28/24 0428 08/30/24 0500  Weight: 96.6 kg 96.4 kg 102.9 kg   Intake/Output:   Intake/Output Summary (Last 24 hours) at 08/30/2024 1031 Last data filed at 08/30/2024 1000 Gross per 24 hour  Intake 2293.03 ml  Output 3502.3 ml  Net -1209.27 ml    Physical Exam    General:  well appearing.  No respiratory difficulty Neck: JVD UTA.  Cor: Regular rate & rhythm. Lungs: clear, diminished bases Extremities: no edema  Neuro: alert & oriented x 3. Affect pleasant.   Telemetry   A fib vs junctional vs NSR? 60s (Personally reviewed)  Check EKG  Labs    CBC Recent Labs    08/29/24 0344 08/30/24 0810  WBC 17.1* 13.7*  NEUTROABS 14.0*  --   HGB 8.9* 8.9*  HCT 27.7* 28.2*  MCV 91.4 92.2  PLT 345 342   Basic Metabolic Panel Recent Labs    89/85/74 0232 08/28/24 1101 08/29/24 1555 08/30/24 0408  NA 132*   < > 129* 129*  K 3.3*   < > 3.9 4.6  CL 91*   < > 92* 94*  CO2 22   < > 21* 19*  GLUCOSE 145*   < > 115* 134*  BUN 28*   < > 39* 30*  CREATININE 7.15*   < > 8.38* 5.75*  CALCIUM  7.7*   < > 6.6* 7.1*  MG 2.4  --   --  2.3  PHOS 4.2   < > 4.0 3.5   < > = values in this interval not displayed.    Liver Function Tests Recent Labs    08/27/24 1814 08/28/24 1447 08/29/24 0344 08/29/24 1555 08/30/24 0408  AST 287*  --  1,820*  --   --   ALT 30  --  90*  --   --   ALKPHOS 174*  --  208*  --   --   BILITOT 0.8  --  0.9  --   --   PROT 6.4*  --  6.5  --   --   ALBUMIN  2.6*   < > 2.5* 2.6* 2.6*   < > = values in this interval not displayed.   Recent Labs    08/28/24 1101  LIPASE 28   Cardiac Enzymes No results for input(s): CKTOTAL, CKMB, CKMBINDEX, TROPONINI in the last 72 hours.  BNP: BNP (last 3 results) Recent Labs    08/27/24 0956  BNP 569.7*    ProBNP (last 3 results) No results for input(s): PROBNP in the last 8760 hours.   D-Dimer No results for input(s): DDIMER in the last 72 hours. Hemoglobin  A1C Recent Labs    08/30/24 0408  HGBA1C 5.7*   Fasting Lipid Panel Recent Labs    08/30/24 0408  CHOL 100  HDL 34*  LDLCALC 46  TRIG 898  CHOLHDL 2.9   Thyroid Function Tests No results for input(s): TSH, T4TOTAL, T3FREE, THYROIDAB in the last 72 hours.  Invalid input(s): FREET3  Other results:   Imaging    DG CHEST PORT 1 VIEW Result Date: 08/29/2024 EXAM: 1 VIEW(S) XRAY OF THE CHEST 08/29/2024 12:17:53 PM COMPARISON: Yesterday. CLINICAL HISTORY: [id number] Encounter for central line placement [id number]. Encounter for central line placement Encounter for central line placement FINDINGS: LINES, TUBES AND DEVICES: Right internal jugular catheter is noted with tip in SVC stable. Swan-Ganz catheter tip seen and expected position of main pulmonary artery. LUNGS AND PLEURA: Right lung opacities concerning for edema, atelectasis, or scarring. No pleural effusion. No definite pneumothorax. HEART AND MEDIASTINUM: No acute abnormality of the cardiac and mediastinal silhouettes. BONES AND SOFT TISSUES: No acute osseous abnormality. IMPRESSION: 1. Stable right lung opacities. 2. No pneumothorax. 3. Right internal jugular catheter tip in the  SVC in expected position. 4. Swan-Ganz catheter tip in the main pulmonary artery in expected position. Electronically signed by: Lynwood Seip MD 08/29/2024 12:41 PM EDT RP Workstation: [id number]     Medications:     Scheduled Medications:  allopurinol  100 mg Oral q AM   aspirin  EC  81 mg Oral Daily   atorvastatin   80 mg Oral QHS   calcitRIOL   1 mcg Oral Q M,W,F-HD   Chlorhexidine  Gluconate Cloth  6 each Topical Q0600   clopidogrel   75 mg Oral Q breakfast   docusate sodium   100 mg Oral BID   famotidine   10 mg Oral QHS   ferric citrate   420 mg Oral TID WC   [name]   Topical BID   heparin   5,000 Units Subcutaneous Q8H   hydrocortisone  sod succinate (SOLU-CORTEF ) inj  100 mg Intravenous Q12H   linezolid   600 mg Oral Q12H   oxidized cellulose  1 each Topical Once   pantoprazole   40 mg Oral Daily   senna  1 tablet Oral BID   sodium chloride  flush  3 mL Intravenous Q12H   tamsulosin   0.4 mg Oral QPM    Infusions:  sodium chloride  10 mL/hr at 08/30/24 0700   anticoagulant sodium citrate      meropenem (MERREM) IV Stopped (08/30/24 9353)   norepinephrine  (LEVOPHED ) Adult infusion 20 mcg/min (08/30/24 1000)   prismasol BGK 4/2.5 1,500 mL/hr at 08/30/24 1006   prismasol BGK 4/2.5 400 mL/hr at 08/30/24 0213   prismasol BGK 4/2.5 400 mL/hr at 08/30/24 0224   vasopressin  0.04 Units/min (08/30/24 1031)    PRN Medications: sodium chloride , acetaminophen , alteplase , anticoagulant sodium citrate , bisacodyl, feeding supplement (NEPRO CARB STEADY), heparin , heparin , HYDROmorphone  (DILAUDID ) injection, lidocaine  (PF), lidocaine -prilocaine , pentafluoroprop-tetrafluoroeth, polyethylene glycol, sodium chloride  flush, traMADol    Patient Profile   Kristy Catoe is a 58 y.o. AAM with chronic HFpEF, PAF s/p watchman, pSVT, ESRD on iHD, chronic hypotension on midodrine , hx small AVMs, CAD s/p DES to LAD and LCx, hx DVT and OSA on CPAP. AHF team to see for cardiogenic shock with RV  failure, s/p NSTEMI, chronic HFpEF.   Assessment/Plan   Mixed cardiogenic/septic shock with RV failure, chronic HFpEF - [facility] 08/28/24 with mid LAD stent restenosis. S/p PCI with DES placement with 0% residual stenosis. RA 20, PA 72/34 (49), PCWP 9, LVEDP 9, Fick CO/CI 6.77/3.07, TD  CO/CI 4.94/2.24 - SCAI stage C.  - Lactic acid 5.8>4.2>3.1>3.3>1.3 - Currently on NE @18  and vaso @ 0.03. Off Epi.  - Co-ox 46%, repeat at 49%. Will start DBA at 2.5 for RV support .  - Fluid overloaded but improving, CVP 14. Now on CRRT  - Not a candidate for digoxin  with ESRD.  - Not a candidate for advanced therapies at this time with ESRD and ongoing infections.  - Abx per primary   NSTEMI, known history of CAD - s/p DES to LAD and LCx (turned down for CABG by [name] 9/24 d/t extent of arterial calcifications) - [facility] 08/28/24 with mid LAD stent restenosis. S/p PCI with DES placement with 0% residual stenosis.  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 Evelia, [name] - Cath pressures slightly elevated - Consider V/Q scan once stable - Previously on sildenafil   Bradycardia - Profound bradycardia 10/13 in the setting of metabolic derangements.  - Has been stable.    Paroxysmal A fib - s/p watchman  - rate controlled - Plan for PO amiodarone  today 200 mg BID - Check EKG today.    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', failed 20' - On iHD M,W,F at home - Now on CRRT with high CVP and high pressor requirements - Nephrology following - Avoid hypotension, pressor support as above   Chronic hypotension - Continue pressor support as above - Consider restarting midodrine  to wean off pressor support eventually  - SBP 110s   Recent hx ESBL E Coli bacteremia R forefoot osteomyelitis s/p amputation - Abx per primary team - Repeat blood cx 3 days ago NGTD  CRITICAL CARE Performed by: Beckey LITTIE Coe   Total  critical care time: 14 minutes  Critical care time was exclusive of separately billable procedures and treating other patients.  Critical care was necessary to treat or prevent imminent or life-threatening deterioration.  Critical care was time spent personally by me on the following activities: development of treatment plan with patient and/or surrogate as well as nursing, discussions with consultants, evaluation of patient's response to treatment, examination of patient, obtaining history from patient or surrogate, ordering and performing treatments and interventions, ordering and review of laboratory studies, ordering and review of radiographic studies, pulse oximetry and re-evaluation of patient's condition.   Length of Stay: 2  Beckey LITTIE Coe, NP  08/30/2024, 10:31 AM  Advanced Heart Failure Team Pager [phone] (M-F; 7a - 5p)  Please contact [facility] for night-coverage after hours (5p -7a ) and weekends on [email or web address]   Patient seen with NP, I formulated the plan and agree with the above note.   This morning, he is on NE 18, vasopressin  0.04.  Co-ox 49%.  He is now on CVVH, I/Os basically even yesterday.  CVP 15-16 on my read.   He had likely atrial tachycardia with RVR yesterday, now with HR in 60s today, ?accelerated junctional rhythm.   He feels better today, eating lunch.   General: NAD Neck: JVP 12-14 cm, no thyromegaly or thyroid nodule.  Lungs: Decreased at bases.  CV: Nondisplaced PMI.  Heart regular S1/S2, no S3/S4, no murmur.  No peripheral edema.  .  Abdomen: Soft, nontender, no hepatosplenomegaly, no distention.  Skin: Intact without lesions or rashes.  Neurologic: Alert and oriented x 3.  Psych: Normal affect. Extremities: No clubbing or cyanosis.  HEENT: Normal.  1. Shock: Patient is currently on NE 18 and vasopressin  0.04.  I suspect mixed cardiogenic and septic shock with low SVR initially.  Co-ox low today at 49%.  - I will add dobutamine 2.5 mcg/kg/min  for RV support. BP stable, will see if we can wean off NE and vasopressin .   - Broad spectrum abx.  2. RV failure: Echo showed EF >75% with severe RV dilation and severe RV dysfunction.  RHC 10/13 showed severe pulmonary arterial hypertension with CVP 19 and PCWP 9, significant RV failure.  The RV dysfunction is not explained by coronary disease (no obstructive RCA disease).  He has a history of pulmonary hypertension (see #3 below).  I suspect RV dysfunction is from long-standing pulmonary hypertension.  He was on sildenafil in the past but not on it now.  - He will likely need midodrine  once off pressors, he needed this at home.  - CVVH for volume removal, aim for UF net negative 150 cc/hr for now.  - Pulmonary HTN management as below.  3. Pulmonary hypertension: Pulmonary arterial hypertension based on RHC 10/14 with PA 72/34, PVR 6.21.  He says that he was followed by a PH specialist in the past at [facility] and did 6 minute walks regularly.  He says he was told that he had PH because he was on dialysis.  He remembers taking sildenafil at some point.  CTA chest this admission did not show PE, there was scarring and pleural plaquing but no definite ILD or sarcoidosis.  - When BP is more stable, would be reasonable to start him on sildenafil 20 tid.  - Would get V/Q scan to look for chronic PEs when he is more stable.  4. ESRD: Patient failed prior renal transplant, was being worked up for another transplant. Significant right-sided volume overload with CVP 19.  - Continue CVVH, aim for UF net negative 150 cc/hr.  5. Atrial arrhythmias: Patient has history of paroxysmal atrial fibrillation and has a Watchman in place.  He looked like he was in an atrial tachycardia yesterday (not clearly AF).  Today, HR in 60s, ?accelerated junctional rhythm.  - Amiodarone  200 mg bid for now. Stop if he re-develops bradycardia.  - No systemic anticoagulation with Watchman.  6. Bradycardia: Patient had transient  bradycardia in setting of hypotension and mental status changes on 10/13.  Follow telemetry closely for recurrence.  7. CAD: NSTEMI with TnI > 24000 on 10/13.  He had no chest pain.  LHC showed 99% in-stent restenosis in the mid LAD treated with DES.  He had severe OM disease as well, managed medically.  No significant RCA disease.  I cannot explain his failed RV by CAD.  - Continue ASA 81 and Plavix  75 - Continue atorvastatin  80.  - Repeat limited echo for LV/RV evaluation.  8. ID: Recent ESBL E coli bacteremia, recent TMA right foot for gangrene. He has been hypotensive with SVR 690, suspect component of septic/distributive shock. PCT was high even in setting of ESRD (46).  - Continue linezolid  + Meropenem.   CRITICAL CARE Performed by: Ezra Shuck  Total critical care time: 40 minutes  Critical care time was exclusive of separately billable procedures and treating other patients.  Critical care was necessary to treat or prevent imminent or life-threatening deterioration.  Critical care was time spent personally by me on the following activities: development of treatment plan with patient and/or surrogate as well as nursing, discussions with consultants, evaluation of patient's response to treatment, examination of  patient, obtaining history from patient or surrogate, ordering and performing treatments and interventions, ordering and review of laboratory studies, ordering and review of radiographic studies, pulse oximetry and re-evaluation of patient's condition.  Ezra Shuck 08/30/2024 11:39 AM

## 2024-08-30 NOTE — Progress Notes (Signed)
 NAME:  [name], MRN:  [id number], DOB:  1966-09-07, LOS: 2 ADMISSION DATE:  08/25/2024, CONSULTATION DATE:  08/27/24 REFERRING MD:  Dr. Camellia Door, CHIEF COMPLAINT:  bradycardia, hypotension   History of Present Illness:  [name] is a 58 y/o M with a PMH significant for ESRD on iHD (MWF), chronic hypotension on Midodrine , hx of small AVMs, paroxysmal a. Fib s/p watchman not on [facility] who presented to the orthopedic clinic for R foot gangrene with a recent hx of ESBL Bacteremia and sepsis presumed to be due to osteomyelitis now s/p R transmetatarsal amputation on 08/25/2024. This AM he developed some confusion, bradycardia, and hypotension requiring atropine administration and vasopressors. The patient was moved to ICU for hemodynamic monitoring and further care   Pertinent  Medical History  As above  Significant Hospital Events: Including procedures, antibiotic start and stop dates in addition to other pertinent events   08/25/2024 admission to the hospital for R transmetatarsal amputation 08/27/2024 admitted to ICU for hypotension and bradycardia, CTA PE negative. TTE with severely enlarged R ventricle, new. NSTEMI with troponin >24K; cardiology started on heparin  gtt and ASA 10/14 underwent cardiac cath and LAD stenting, remain on epinephrine  and norepinephrine  10/15 patient is requiring multiple vasopressor support, started on CRRT.  Serum troponin remain elevated >24k   Interim History / Subjective:  Patient denies chest pain or shortness of breath, stated feeling well He remained afebrile Still requiring norepinephrine  and vasopressin , came off of epinephrine  Started on CRRT yesterday, remain net -300 cc Coox 46% Went into A-fib with RVR overnight, started on amiodarone , now converted to sinus rhythm with heart rate in 60s.  Objective   Blood pressure (!) 99/17, pulse 70, temperature 98 F (36.7 C), temperature source Oral, resp. rate (!) 26, height 6' 0.99 (1.854 m), weight  102.9 kg, SpO2 97%. PAP: (54-72)/(16-45) 56/24 CVP:  [6 mmHg-61 mmHg] 28 mmHg CO:  [5.2 L/min] 5.2 L/min CI:  [2.36 L/min/m2-2.4 L/min/m2] 2.36 L/min/m2      Intake/Output Summary (Last 24 hours) at 08/30/2024 0833 Last data filed at 08/30/2024 0800 Gross per 24 hour  Intake 2501 ml  Output 3008.3 ml  Net -507.3 ml   Filed Weights   08/27/24 1141 08/28/24 0428 08/30/24 0500  Weight: 96.6 kg 96.4 kg 102.9 kg    Examination: General: Acute on chronically ill-appearing middle-aged male, lying on the bed HEENT: [place]/AT, eyes anicteric.  moist mucus membranes.  On CPAP Neuro: Alert, awake following commands Chest: Reduced air entry at the bases bilaterally, no wheezes or rhonchi Heart: Regular rate and rhythm, no murmurs or gallops Abdomen: Soft, nontender, nondistended, bowel sounds present Extremities: Right lower extremity forefoot is amputated, well-dressed, no discharge noted   Labs and images reviewed  Patient Lines/Drains/Airways Status     Active Line/Drains/Airways     Name Placement date Placement time Site Days   Arterial Line 08/27/24 Right Femoral 08/27/24  1200  Femoral  3   CVC Triple Lumen 08/27/24 Left Femoral 08/27/24  1500  -- 3   Fistula / Graft Left Forearm Arteriovenous fistula --  --  Forearm  --   Hemodialysis Catheter Right Internal jugular Triple lumen Temporary (Non-Tunneled) 08/29/24  1000  Internal jugular  1   Sheath 08/28/24 Left Femoral 08/28/24  1950  Femoral  2   Midline Single Lumen 08/17/24 Right Cephalic 8 cm 0 cm 08/17/24  1541  Cephalic  13   Negative Pressure Wound Therapy Leg Left;Lower 11/30/22  1518  --  639  Negative Pressure Wound Therapy Leg Left;Lower;Anterior 03/22/23  1427  --  527   Wound 08/12/24 2300 Atypical Toe (Comment  which one) Anterior;Right 08/12/24  2300  Toe (Comment  which one)  18   Wound 08/25/24 1330 Other (Comment) Toe (Comment  which one) Anterior;Left 08/25/24  1330  Toe (Comment  which one)  5   Wound  08/29/24 0800 Pressure Injury Buttocks Right Deep Tissue Pressure Injury - Purple or maroon localized area of discolored intact skin or blood-filled blister due to damage of underlying soft tissue from pressure and/or shear. 08/29/24  0800  Buttocks  1   Wound 08/29/24 0800 Pressure Injury Buttocks Left Deep Tissue Pressure Injury - Purple or maroon localized area of discolored intact skin or blood-filled blister due to damage of underlying soft tissue from pressure and/or shear. 08/29/24  0800  Buttocks  1        Resolved Hospital Problem list   Hypoglycemia Symptomatic bradycardia, resolved Lactic acidosis  Assessment & Plan:  Acute on chronic cor pulmonale with cardiogenic shock Acute NSTEMI status post DES to LAD Severe pulmonary hypertension Mixed cardiogenic and septic shock Hypervolemic hyponatremia Paroxysmal A-fib status post Watchman device QTc prolongation Recent history of ESBL E. coli bacteremia and right forefoot osteomyelitis status post amputation End-stage renal disease on hemodialysi on CRRTs Deep tissue injury on bilateral buttock, unable to determine if it was present on admission Anemia of renal disease   Appreciate cardiology follow-up Epinephrine  was titrated off, remained on norepinephrine  and vasopressin  Titrate with MAP goal 65 Continue aspirin , Plavix  and statin Co is 46%, ?  Appropriate agent, defer to advanced heart failure team GDMT is limited by shock Converted back to sinus rhythm Continue telemetry monitoring His procalcitonin is elevated to 45, in the setting of end-stage renal disease it could be elevated but not to that extent Cultures have been negative Continue Zyvox  and meropenem for now His SVR is low which is consistent with septic picture, Coox is low which suggest combination of cardiogenic and septic picture Blood cultures have been negative Continue wound care Monitor H&H and transfuse if less than 7   Labs   CBC: Recent Labs   Lab 08/27/24 0956 08/27/24 1303 08/27/24 1814 08/28/24 0232 08/28/24 2107 08/29/24 0344 08/30/24 0810  WBC 15.8*  --  14.2* 12.6* 16.2* 17.1* 13.7*  NEUTROABS 12.7*  --   --   --   --  14.0*  --   HGB 9.0*   < > 9.2* 9.1* 8.6* 8.9* 8.9*  HCT 31.4*   < > 30.4* 28.6* 27.4* 27.7* 28.2*  MCV 102.6*  --  94.7 92.0 91.9 91.4 92.2  PLT 326  --  361 370 346 345 342   < > = values in this interval not displayed.    Basic Metabolic Panel: Recent Labs  Lab 08/27/24 1814 08/27/24 2024 08/28/24 0232 08/28/24 1101 08/28/24 1447 08/28/24 2107 08/29/24 0344 08/29/24 1555 08/30/24 0408  NA 132*  --  132*  --  129*  --  126* 129* 129*  K 4.4   < > 3.3*   < > 3.7 3.3* 3.5 3.9 4.6  CL 95*  --  91*  --  90*  --  90* 92* 94*  CO2 17*  --  22  --  21*  --  20* 21* 19*  GLUCOSE 142*  --  145*  --  149*  --  136* 115* 134*  BUN 56*  --  28*  --  37*  --  43* 39* 30*  CREATININE 12.28*  --  7.15*  --  8.63* 8.78* 9.15* 8.38* 5.75*  CALCIUM  6.9*  --  7.7*  --  7.0*  --  6.7* 6.6* 7.1*  MG 2.0  --  2.4  --   --   --   --   --  2.3  PHOS 5.2*  --  4.2  --  3.8  --   --  4.0 3.5   < > = values in this interval not displayed.   GFR: Estimated Creatinine Clearance: 18.1 mL/min (A) (by C-G formula based on SCr of 5.75 mg/dL (H)). Recent Labs  Lab 08/28/24 0232 08/28/24 1101 08/28/24 1447 08/28/24 2107 08/29/24 0344 08/29/24 0913 08/29/24 1336 08/30/24 0810  PROCALCITON  --  45.77  --   --   --   --   --   --   WBC 12.6*  --   --  16.2* 17.1*  --   --  13.7*  LATICACIDVEN  --  3.1* 3.3*  --   --  1.3 1.6  --     Liver Function Tests: Recent Labs  Lab 08/27/24 1814 08/28/24 1447 08/29/24 0344 08/29/24 1555 08/30/24 0408  AST 287*  --  1,820*  --   --   ALT 30  --  90*  --   --   ALKPHOS 174*  --  208*  --   --   BILITOT 0.8  --  0.9  --   --   PROT 6.4*  --  6.5  --   --   ALBUMIN  2.6* 2.6* 2.5* 2.6* 2.6*   Recent Labs  Lab 08/28/24 1101  LIPASE 28   No results for  input(s): AMMONIA in the last 168 hours.  ABG    Component Value Date/Time   PHART 7.260 (L) 08/27/2024 1303   PCO2ART 26.1 (L) 08/27/2024 1303   PO2ART 95 08/27/2024 1303   HCO3 11.7 (L) 08/27/2024 1303   TCO2 12 (L) 08/27/2024 1303   ACIDBASEDEF 14.0 (H) 08/27/2024 1303   O2SAT 46 08/30/2024 0522     Coagulation Profile: No results for input(s): INR, PROTIME in the last 168 hours.  Cardiac Enzymes: No results for input(s): CKTOTAL, CKMB, CKMBINDEX, TROPONINI in the last 168 hours.  HbA1C: Hgb A1c MFr Bld  Date/Time Value Ref Range Status  08/30/2024 04:08 AM 5.7 (H) 4.8 - 5.6 % Final    Comment:    (NOTE) Diagnosis of Diabetes The following HbA1c ranges recommended by the American Diabetes Association (ADA) may be used as an aid in the diagnosis of diabetes mellitus.  Hemoglobin             Suggested A1C NGSP%              Diagnosis  <5.7                   Non Diabetic  5.7-6.4                Pre-Diabetic  >6.4                   Diabetic  <7.0                   Glycemic control for                       adults with diabetes.    03/31/2021 05:38 AM 5.6 4.8 -  5.6 % Final    Comment:    (NOTE) Pre diabetes:          5.7%-6.4%  Diabetes:              >6.4%  Glycemic control for   <7.0% adults with diabetes     CBG: Recent Labs  Lab 08/29/24 1553 08/29/24 1952 08/29/24 2341 08/30/24 0341 08/30/24 0807  GLUCAP 121* 110* 110* 106* 146*    The patient is critically ill due to acute NSTEMI/cardiogenic shock.  Critical care was necessary to treat or prevent imminent or life-threatening deterioration.  Critical care was time spent personally by me on the following activities: development of treatment plan with patient and/or surrogate as well as nursing, discussions with consultants, evaluation of patient's response to treatment, examination of patient, obtaining history from patient or surrogate, ordering and performing treatments and  interventions, ordering and review of laboratory studies, ordering and review of radiographic studies, pulse oximetry, re-evaluation of patient's condition and participation in multidisciplinary rounds.   During this encounter critical care time was devoted to patient care services described in this note for 40 minutes.     Valinda Novas, MD [facility] See Amion for pager If no response to pager, please call [phone] until 7pm After 7pm, Please call E-link [phone]

## 2024-08-30 NOTE — Progress Notes (Signed)
 [name] Progress Note  Subjective:  More alert today Net + 1.8 L yest, and net  neg 1.6 L today so far CVP 25 Vaso 0.04 and levo peaked at 32 overnight, 20 m/m now Epi gtt is now off Per RN, UF is going well, they are pulling 150- 200 cc/hr net negative and weaning pressors down   Vitals:   08/30/24 1000 08/30/24 1015 08/30/24 1030 08/30/24 1045  BP:      Pulse: 65 73 64 63  Resp: (!) 27 16 (!) 22 (!) 32  Temp:      TempSrc:      SpO2: 96% (!) 86% 95% 95%  Weight:      Height:        Exam: General: Ox 3, up at 45 deg, looks better today Neck: JVD not elevated. Lungs: Clear bilaterally to auscultation  Heart: RRR with normal S1, S2. No RG.  Abdomen: Soft, non-tender, non-distended  Lower extremities: No edema; R foot in hard boot Neuro: Alert and oriented X 3. Moves all ext Dialysis Access: L AVF +t/b    OP HD: MWF - [facility] 3h  B400  94kg  2K bath  Heparin  none - Mircera 60mcg IV q 2 weeks - Venofer  50mg  IV weekly - Calcitriol  1mcg PO q HD   Assessment/ Plan: Acute/ chronic cor pulmonale w/ cardiogenic+ septic shock: epi gtt is off, remains on levo / vaso gtt's. Back in NSR.  Acute NSTEMI: s/p DES to LAD H/o severe pulm HTN Sepsis: possibly, getting Zyvox  and meropenem per CCM  ESRD: on HD MWF. Had HD here 10/14 early am. Transitioned to CRRT on 10/15 due to shock and vol overload. Cont CRRT.   Volume: sig pulm edema by CXR 10/14, improving on 10/15 CXR. UF going well at 150 -200 cc/ hr net negative today. Continue.   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 Transfuse prn.  Secondary hyperparathyroidism: Ca ok. Cont home meds  CAD  A-fib  Myer Fret MD  [facility] 08/30/2024, 10:54 AM  Recent Labs  Lab 08/29/24 0344 08/29/24 1555 08/30/24 0408 08/30/24 0810  HGB 8.9*  --   --  8.9*  ALBUMIN  2.5* 2.6* 2.6*  --   CALCIUM  6.7* 6.6* 7.1*  --   PHOS   --  4.0 3.5  --   CREATININE 9.15* 8.38* 5.75*  --   K 3.5 3.9 4.6  --    No results for input(s): IRON , TIBC, FERRITIN in the last 168 hours. Inpatient medications:  allopurinol  100 mg Oral q AM   aspirin  EC  81 mg Oral Daily   atorvastatin   80 mg Oral QHS   calcitRIOL   1 mcg Oral Q M,W,F-HD   Chlorhexidine  Gluconate Cloth  6 each Topical Q0600   clopidogrel   75 mg Oral Q breakfast   docusate sodium   100 mg Oral BID   famotidine   10 mg Oral QHS   ferric citrate   420 mg Oral TID WC   [name]   Topical BID   heparin   5,000 Units Subcutaneous Q8H   hydrocortisone  sod succinate (SOLU-CORTEF ) inj  100 mg Intravenous Q12H   linezolid   600 mg Oral Q12H   oxidized cellulose  1 each Topical Once   pantoprazole   40 mg Oral Daily   senna  1 tablet Oral BID   sodium chloride  flush  3 mL Intravenous Q12H  tamsulosin   0.4 mg Oral QPM    sodium chloride  10 mL/hr at 08/30/24 0700   anticoagulant sodium citrate      meropenem (MERREM) IV Stopped (08/30/24 9353)   norepinephrine  (LEVOPHED ) Adult infusion 20 mcg/min (08/30/24 1000)   prismasol BGK 4/2.5 1,500 mL/hr at 08/30/24 1006   prismasol BGK 4/2.5 400 mL/hr at 08/30/24 0213   prismasol BGK 4/2.5 400 mL/hr at 08/30/24 0224   vasopressin  0.04 Units/min (08/30/24 1031)   sodium chloride , acetaminophen , alteplase , anticoagulant sodium citrate , bisacodyl, feeding supplement (NEPRO CARB STEADY), heparin , heparin , HYDROmorphone  (DILAUDID ) injection, lidocaine  (PF), lidocaine -prilocaine , pentafluoroprop-tetrafluoroeth, polyethylene glycol, sodium chloride  flush, traMADol

## 2024-08-30 NOTE — Progress Notes (Signed)
 Echocardiogram 2D Echocardiogram has been performed.  [name] 08/30/2024, 4:02 PM

## 2024-08-30 NOTE — TOC Progression Note (Signed)
 Transition of Care [facility]) - Progression Note    Patient Details  Name: [name] MRN: [id number] Date of Birth: 10/16/1966  [facility] CM/SW Contact  Justina Delcia Czar, RN Phone Number: [phone] 08/30/2024, 4:36 PM  Clinical Narrative:     Spoke to pt's wife and states he has [facility] for IV abx in past. Pt was independent pta. Pt drives to HD and appts. Wife states she will take him to appts or family.  Will need PT/OT recommendations. IP rehab vs HH.   Will continue to follow for dc needs.     Expected Discharge Plan: Home w Home Health Services Barriers to Discharge: Continued Medical Work up               Expected Discharge Plan and Services   Discharge Planning Services: CM Consult   Living arrangements for the past 2 months: Single Family Home                                       Social Drivers of Health (SDOH) Interventions SDOH Screenings   Food Insecurity: No Food Insecurity (08/25/2024)  Housing: Low Risk  (08/25/2024)  Transportation Needs: No Transportation Needs (08/25/2024)  Utilities: Not At Risk (08/25/2024)  Depression (PHQ2-9): Low Risk  (08/25/2023)  Tobacco Use: Low Risk  (08/25/2024)    Readmission Risk Interventions    08/17/2024    3:30 PM 08/17/2024   10:25 AM 01/25/2023   11:36 AM  Readmission Risk Prevention Plan  Transportation Screening Complete Complete Complete  HRI or Home Care Consult Complete Complete   Social Work Consult for Recovery Care Planning/Counseling Complete Complete   Palliative Care Screening Not Applicable Not Applicable   Medication Review [employer]) Complete Complete Complete  PCP or Specialist appointment within 3-5 days of discharge   Complete  HRI or Home Care Consult   Complete  SW Recovery Care/Counseling Consult   Complete  Palliative Care Screening   Not Applicable  Skilled Nursing Facility   Not Applicable

## 2024-08-31 ENCOUNTER — Other Ambulatory Visit (HOSPITAL_COMMUNITY): Payer: Self-pay

## 2024-08-31 ENCOUNTER — Inpatient Hospital Stay (HOSPITAL_COMMUNITY)

## 2024-08-31 DIAGNOSIS — Z1612 Extended spectrum beta lactamase (ESBL) resistance: Secondary | ICD-10-CM | POA: Diagnosis not present

## 2024-08-31 DIAGNOSIS — R570 Cardiogenic shock: Secondary | ICD-10-CM | POA: Diagnosis not present

## 2024-08-31 DIAGNOSIS — I2781 Cor pulmonale (chronic): Secondary | ICD-10-CM | POA: Diagnosis not present

## 2024-08-31 DIAGNOSIS — I50810 Right heart failure, unspecified: Secondary | ICD-10-CM | POA: Diagnosis not present

## 2024-08-31 DIAGNOSIS — N186 End stage renal disease: Secondary | ICD-10-CM | POA: Diagnosis not present

## 2024-08-31 DIAGNOSIS — R4182 Altered mental status, unspecified: Secondary | ICD-10-CM

## 2024-08-31 DIAGNOSIS — I214 Non-ST elevation (NSTEMI) myocardial infarction: Secondary | ICD-10-CM | POA: Diagnosis not present

## 2024-08-31 DIAGNOSIS — I272 Pulmonary hypertension, unspecified: Secondary | ICD-10-CM | POA: Diagnosis not present

## 2024-08-31 LAB — GLUCOSE, CAPILLARY
Glucose-Capillary: 140 mg/dL — ABNORMAL HIGH (ref 70–99)
Glucose-Capillary: 148 mg/dL — ABNORMAL HIGH (ref 70–99)
Glucose-Capillary: 149 mg/dL — ABNORMAL HIGH (ref 70–99)
Glucose-Capillary: 139 mg/dL — ABNORMAL HIGH (ref 70–99)
Glucose-Capillary: 135 mg/dL — ABNORMAL HIGH (ref 70–99)

## 2024-08-31 LAB — RENAL FUNCTION PANEL
Albumin: 2.7 g/dL — ABNORMAL LOW (ref 3.5–5.0)
Albumin: 2.7 g/dL — ABNORMAL LOW (ref 3.5–5.0)
Anion gap: 14 (ref 5–15)
Anion gap: 13 (ref 5–15)
BUN: 24 mg/dL — ABNORMAL HIGH (ref 6–20)
BUN: 21 mg/dL — ABNORMAL HIGH (ref 6–20)
CO2: 21 mmol/L — ABNORMAL LOW (ref 22–32)
CO2: 22 mmol/L (ref 22–32)
Calcium: 7.5 mg/dL — ABNORMAL LOW (ref 8.9–10.3)
Calcium: 7.5 mg/dL — ABNORMAL LOW (ref 8.9–10.3)
Chloride: 98 mmol/L (ref 98–111)
Chloride: 100 mmol/L (ref 98–111)
Creatinine, Ser: 3.76 mg/dL — ABNORMAL HIGH (ref 0.61–1.24)
Creatinine, Ser: 3.03 mg/dL — ABNORMAL HIGH (ref 0.61–1.24)
GFR, Estimated: 18 mL/min — ABNORMAL LOW (ref >60)
GFR, Estimated: 23 mL/min — ABNORMAL LOW (ref >60)
Glucose, Bld: 144 mg/dL — ABNORMAL HIGH (ref 70–99)
Glucose, Bld: 143 mg/dL — ABNORMAL HIGH (ref 70–99)
Phosphorus: 2.1 mg/dL — ABNORMAL LOW (ref 2.5–4.6)
Phosphorus: 1.8 mg/dL — ABNORMAL LOW (ref 2.5–4.6)
Potassium: 3.5 mmol/L (ref 3.5–5.1)
Potassium: 3.4 mmol/L — ABNORMAL LOW (ref 3.5–5.1)
Sodium: 133 mmol/L — ABNORMAL LOW (ref 135–145)
Sodium: 135 mmol/L (ref 135–145)

## 2024-08-31 LAB — CBC
HCT: 26.3 % — ABNORMAL LOW (ref 39.0–52.0)
Hemoglobin: 8.2 g/dL — ABNORMAL LOW (ref 13.0–17.0)
MCH: 28.9 pg (ref 26.0–34.0)
MCHC: 31.2 g/dL (ref 30.0–36.0)
MCV: 92.6 fL (ref 80.0–100.0)
Platelets: 323 K/uL (ref 150–400)
RBC: 2.84 MIL/uL — ABNORMAL LOW (ref 4.22–5.81)
RDW: 17.4 % — ABNORMAL HIGH (ref 11.5–15.5)
WBC: 13.8 K/uL — ABNORMAL HIGH (ref 4.0–10.5)
nRBC: 0.2 % (ref 0.0–0.2)

## 2024-08-31 LAB — POCT I-STAT 7, (LYTES, BLD GAS, ICA,H+H)
Acid-Base Excess: 0 mmol/L (ref 0.0–2.0)
Bicarbonate: 23.8 mmol/L (ref 20.0–28.0)
Calcium, Ion: 1.02 mmol/L — ABNORMAL LOW (ref 1.15–1.40)
HCT: 28 % — ABNORMAL LOW (ref 39.0–52.0)
Hemoglobin: 9.5 g/dL — ABNORMAL LOW (ref 13.0–17.0)
O2 Saturation: 95 %
Patient temperature: 37
Potassium: 3.6 mmol/L (ref 3.5–5.1)
Sodium: 135 mmol/L (ref 135–145)
TCO2: 25 mmol/L (ref 22–32)
pCO2 arterial: 35.1 mmHg (ref 32–48)
pH, Arterial: 7.439 (ref 7.35–7.45)
pO2, Arterial: 73 mmHg — ABNORMAL LOW (ref 83–108)

## 2024-08-31 LAB — COOXEMETRY PANEL
Carboxyhemoglobin: 1.8 % — ABNORMAL HIGH (ref 0.5–1.5)
Methemoglobin: <0.7 % (ref 0.0–1.5)
O2 Saturation: 61.3 %
Total hemoglobin: 8.1 g/dL — ABNORMAL LOW (ref 12.0–16.0)

## 2024-08-31 LAB — MAGNESIUM: Magnesium: 2.5 mg/dL — ABNORMAL HIGH (ref 1.7–2.4)

## 2024-08-31 LAB — PROCALCITONIN: Procalcitonin: 22.24 ng/mL

## 2024-08-31 LAB — AMMONIA: Ammonia: 23 umol/L (ref 9–35)

## 2024-08-31 MED ORDER — OXYCODONE HCL 5 MG PO TABS
2.5000 mg | ORAL_TABLET | ORAL | Status: DC | PRN
  Administered 2024-08-31: 2.5 mg via ORAL
  Filled 2024-08-31: qty 1

## 2024-08-31 MED ORDER — HALOPERIDOL LACTATE 5 MG/ML IJ SOLN
5.0000 mg | Freq: Once | INTRAMUSCULAR | Status: AC
  Administered 2024-08-31: 5 mg via INTRAVENOUS
  Filled 2024-08-31: qty 1

## 2024-08-31 MED ORDER — ORAL CARE MOUTH RINSE
15.0000 mL | OROMUCOSAL | Status: DC
  Administered 2024-08-31 – 2024-09-01 (×4): 15 mL via OROMUCOSAL

## 2024-08-31 MED ORDER — QUETIAPINE FUMARATE 50 MG PO TABS
50.0000 mg | ORAL_TABLET | Freq: Two times a day (BID) | ORAL | Status: DC
Start: 2024-08-31 — End: 2024-09-06
  Administered 2024-08-31 – 2024-09-06 (×12): 50 mg via ORAL
  Filled 2024-08-31 (×12): qty 1

## 2024-08-31 MED ORDER — QUETIAPINE FUMARATE 50 MG PO TABS: 50.0000 mg | ORAL_TABLET | Freq: Every day | ORAL | Status: DC

## 2024-08-31 MED ORDER — ORAL CARE MOUTH RINSE: 15.0000 mL | OROMUCOSAL | Status: DC | PRN

## 2024-08-31 MED ORDER — STERILE WATER FOR INJECTION IJ SOLN
INTRAMUSCULAR | Status: AC
  Administered 2024-08-31: 10 mL
  Filled 2024-08-31: qty 10

## 2024-08-31 MED ORDER — DEXMEDETOMIDINE HCL IN NACL 400 MCG/100ML IV SOLN
0.0000 ug/kg/h | INTRAVENOUS | Status: DC
  Administered 2024-08-31 (×2): 1.2 ug/kg/h via INTRAVENOUS
  Administered 2024-08-31: 0.1 ug/kg/h via INTRAVENOUS
  Administered 2024-09-01: 1.2 ug/kg/h via INTRAVENOUS
  Administered 2024-09-01: 1 ug/kg/h via INTRAVENOUS
  Administered 2024-09-01: 0.4 ug/kg/h via INTRAVENOUS
  Administered 2024-09-02: 0.5 ug/kg/h via INTRAVENOUS
  Filled 2024-08-31: qty 200
  Filled 2024-08-31: qty 300
  Filled 2024-08-31: qty 200
  Filled 2024-08-31: qty 100

## 2024-08-31 MED ORDER — POTASSIUM PHOSPHATES 15 MMOLE/5ML IV SOLN
15.0000 mmol | Freq: Once | INTRAVENOUS | Status: AC
  Administered 2024-08-31: 15 mmol via INTRAVENOUS
  Filled 2024-08-31: qty 5

## 2024-08-31 MED ORDER — MIDODRINE HCL 5 MG PO TABS
15.0000 mg | ORAL_TABLET | Freq: Three times a day (TID) | ORAL | Status: DC
  Administered 2024-08-31 – 2024-09-02 (×7): 15 mg via ORAL
  Filled 2024-08-31 (×7): qty 3

## 2024-08-31 MED ORDER — NEPRO/CARBSTEADY PO LIQD
237.0000 mL | Freq: Two times a day (BID) | ORAL | Status: DC
  Administered 2024-08-31 (×2): 237 mL via ORAL

## 2024-08-31 MED ORDER — HALOPERIDOL LACTATE 5 MG/ML IJ SOLN
2.0000 mg | Freq: Four times a day (QID) | INTRAMUSCULAR | Status: AC | PRN
  Administered 2024-08-31: 2 mg via INTRAVENOUS
  Filled 2024-08-31 (×2): qty 1

## 2024-08-31 MED ORDER — HYDROCORTISONE SOD SUC (PF) 100 MG IJ SOLR
50.0000 mg | Freq: Two times a day (BID) | INTRAMUSCULAR | Status: DC
  Administered 2024-08-31: 50 mg via INTRAVENOUS
  Filled 2024-08-31: qty 2

## 2024-08-31 NOTE — Plan of Care (Signed)
  Problem: Education: Goal: Knowledge of General Education information will improve Description: Including pain rating scale, medication(s)/side effects and non-pharmacologic comfort measures Outcome: Progressing   Problem: Health Behavior/Discharge Planning: Goal: Ability to manage health-related needs will improve Outcome: Progressing   Problem: Clinical Measurements: Goal: Ability to maintain clinical measurements within normal limits will improve Outcome: Progressing Goal: Diagnostic test results will improve Outcome: Progressing Goal: Respiratory complications will improve Outcome: Progressing Goal: Cardiovascular complication will be avoided Outcome: Progressing   Problem: Nutrition: Goal: Adequate nutrition will be maintained Outcome: Progressing   Problem: Elimination: Goal: Will not experience complications related to bowel motility Outcome: Progressing   Problem: Pain Managment: Goal: General experience of comfort will improve and/or be controlled Outcome: Progressing

## 2024-08-31 NOTE — Progress Notes (Signed)
 Still agitated Have increased dex ceiling  Stopped oxy as has had issues w/ opioids before Have changed Seroquel to bid and will give dose early  Finally will decrease steroid dosing. I worry this may be contributing to delirium as well  [name] Banner ACNP-BC [facility] Pulmonary/Critical Care Pager # [phone] OR # [phone] if no answer

## 2024-08-31 NOTE — Progress Notes (Signed)
 Increased agitation  Visual hallucinations  Trying to get up OOB.  Becoming more difficult to redirect.  Pulling at tubes and lines Didn't really respond to haldol Plan Will start dex gtt  Cont seroquel  Supportive care

## 2024-08-31 NOTE — Progress Notes (Signed)
 [facility] Progress Note  Subjective:  A bit confused per RN Net neg 3.5 L yesterday and 1.8 L today so far CVP down from 25 to 16-18 this am On RA Levo gtt 14 last night, down to 10 this am Also on vaso at 0.04 and IV dobutamine   Vitals:   08/31/24 0930 08/31/24 0945 08/31/24 1000 08/31/24 1015  BP:      Pulse: 93   83  Resp: (!) 22 16 (!) 33 (!) 29  Temp:      TempSrc:      SpO2: 95%   92%  Weight:      Height:        Exam: General: alert, no distress Neck: JVD not elevated. Lungs: Clear bilaterally to auscultation  Heart: RRR with normal S1, S2. No RG.  Abdomen: Soft, non-tender, non-distended  Lower extremities: No edema; R foot in hard boot Neuro: Alert, moves all ext Dialysis Access: L AVF +t/b    OP HD: MWF [facility] 3h  B400  94kg  2K bath  Heparin  none - Mircera 60mcg IV q 2 weeks - Venofer  50mg  IV weekly - Calcitriol  1mcg PO q HD   Assessment/ Plan: Acute/ chronic cor pulmonale w/ cardiogenic+ septic shock: epi gtt off, still on vaso 0.04 and dobutamine. Levo down to 10 from 14 yesterday. Back in NSR.  Acute NSTEMI: s/p DES to LAD Sepsis: possible; getting Zyvox  and meropenem per CCM Hypoxic resp failure: w/ pulm edema by CXR. Improving.  ESRD: on HD MWF. Had HD here 10/14 early am. Transitioned to CRRT on 10/15. Cont CRRT.   Volume: sig pulm edema by CXR 10/14, improving on 10/15 and today. Large UF yest 3.5 L net neg. CVP down mid 20s to 16 this am. Just under dry wt today. Cont another 1-2 days of UF w/ CRRT as tolerated. Lowering UF to 75-125 cc/hr today.  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 Transfuse prn.  Secondary hyperparathyroidism: Ca ok. Cont home meds  CAD  A-fib  Myer Fret MD  [facility] 08/31/2024, 10:38 AM  Recent Labs  Lab 08/30/24 1557 08/31/24 0236 08/31/24 0244  HGB  --  8.2* 9.5*  ALBUMIN  2.7* 2.7*  --   CALCIUM  7.3*  7.5*  --   PHOS 2.9 2.1*  --   CREATININE 4.47* 3.76*  --   K 4.0 3.5 3.6   No results for input(s): IRON , TIBC, FERRITIN in the last 168 hours. Inpatient medications:  allopurinol  100 mg Oral q AM   amiodarone   200 mg Oral BID   aspirin  EC  81 mg Oral Daily   atorvastatin   80 mg Oral QHS   calcitRIOL   1 mcg Oral Q M,W,F-HD   Chlorhexidine  Gluconate Cloth  6 each Topical Daily   clopidogrel   75 mg Oral Q breakfast   docusate sodium   100 mg Oral BID   famotidine   10 mg Oral QHS   feeding supplement (NEPRO CARB STEADY)  237 mL Oral BID BM   ferric citrate   420 mg Oral TID WC   [name]   Topical BID   heparin   5,000 Units Subcutaneous Q8H   hydrocortisone  sod succinate (SOLU-CORTEF ) inj  100 mg Intravenous Q12H   linezolid   600 mg Oral Q12H   midodrine   15 mg Oral Q8H   mouth rinse  15 mL Mouth Rinse 4 times per  day   oxidized cellulose  1 each Topical Once   pantoprazole   40 mg Oral Daily   QUEtiapine  50 mg Oral QHS   senna  1 tablet Oral BID   sodium chloride  flush  3 mL Intravenous Q12H   tamsulosin   0.4 mg Oral QPM    sodium chloride  10 mL/hr at 08/30/24 0700   DOBUTamine 2.5 mcg/kg/min (08/31/24 1000)   meropenem (MERREM) IV Stopped (08/31/24 9388)   norepinephrine  (LEVOPHED ) Adult infusion 10 mcg/min (08/31/24 1000)   prismasol BGK 4/2.5 1,500 mL/hr at 08/31/24 1006   prismasol BGK 4/2.5 400 mL/hr at 08/31/24 0316   prismasol BGK 4/2.5 400 mL/hr at 08/31/24 0314   vasopressin  0.04 Units/min (08/31/24 1000)   sodium chloride , acetaminophen , bisacodyl, haloperidol lactate, heparin , mouth rinse, oxyCODONE , polyethylene glycol, sodium chloride  flush

## 2024-08-31 NOTE — Progress Notes (Addendum)
 Advanced Heart Failure Rounding Note  Cardiologist: None  Chief Complaint: Cardiogenic shock with RV failure post NSTEMI  Subjective:    Continues on DBA 2.5 + NE 10 (down from 18) + VP 0.04.   Co-ox 61%, FICK CI 2.6, SVR 662   On CRRT, currently pulling 100 cc/hr. CVP 11   Remains in Afib, V-rates 80s. On PO amio.   Limited Echo 10/16, EF 55-60%, RV not well visualized.   Sitting up in bed. No current complaints. Denies CP and dyspnea. Wife present at bedside.   Objective:   Weight Range: 93.7 kg Body mass index is 27.26 kg/m.   Vital Signs:   Temp:  [98.1 F (36.7 C)-98.3 F (36.8 C)] 98.1 F (36.7 C) (10/17 0800) Pulse Rate:  [62-123] 83 (10/17 1015) Resp:  [10-41] 29 (10/17 1015) SpO2:  [74 %-100 %] 92 % (10/17 1015) Arterial Line BP: (67-137)/(39-81) 105/54 (10/17 1015) Weight:  [93.7 kg] 93.7 kg (10/17 0500) Last BM Date : 08/30/24  Weight change: Filed Weights   08/28/24 0428 08/30/24 0500 08/31/24 0500  Weight: 96.4 kg 102.9 kg 93.7 kg   Intake/Output:   Intake/Output Summary (Last 24 hours) at 08/31/2024 1051 Last data filed at 08/31/2024 1000 Gross per 24 hour  Intake 1171.73 ml  Output 4958 ml  Net -3786.27 ml    Physical Exam   GENERAL: fatigued appearing, NAD Lungs- clear  CARDIAC:  JVP 12 cm         Irregularly irregular rhythm and rate, no LEE   ABDOMEN: Soft, non-tender, non-distended.  EXTREMITIES: Warm and well perfused. RTMTA   NEUROLOGIC: No obvious FND  Telemetry   Afib w/ CVR 80s (Personally reviewed)   Labs    CBC Recent Labs    08/29/24 0344 08/30/24 0810 08/31/24 0236 08/31/24 0244  WBC 17.1* 13.7* 13.8*  --   NEUTROABS 14.0*  --   --   --   HGB 8.9* 8.9* 8.2* 9.5*  HCT 27.7* 28.2* 26.3* 28.0*  MCV 91.4 92.2 92.6  --   PLT 345 342 323  --    Basic Metabolic Panel Recent Labs    89/83/74 0408 08/30/24 1557 08/31/24 0236 08/31/24 0244  NA 129* 132* 133* 135  K 4.6 4.0 3.5 3.6  CL 94* 96* 98  --    CO2 19* 21* 21*  --   GLUCOSE 134* 147* 144*  --   BUN 30* 25* 24*  --   CREATININE 5.75* 4.47* 3.76*  --   CALCIUM  7.1* 7.3* 7.5*  --   MG 2.3  --  2.5*  --   PHOS 3.5 2.9 2.1*  --    Liver Function Tests Recent Labs    08/29/24 0344 08/29/24 1555 08/30/24 1557 08/31/24 0236  AST 1,820*  --   --   --   ALT 90*  --   --   --   ALKPHOS 208*  --   --   --   BILITOT 0.9  --   --   --   PROT 6.5  --   --   --   ALBUMIN  2.5*   < > 2.7* 2.7*   < > = values in this interval not displayed.   Recent Labs    08/28/24 1101  LIPASE 28   Cardiac Enzymes No results for input(s): CKTOTAL, CKMB, CKMBINDEX, TROPONINI in the last 72 hours.  BNP: BNP (last 3 results) Recent Labs    08/27/24 0956  BNP 569.7*  ProBNP (last 3 results) No results for input(s): PROBNP in the last 8760 hours.   D-Dimer No results for input(s): DDIMER in the last 72 hours. Hemoglobin A1C Recent Labs    08/30/24 0408  HGBA1C 5.7*   Fasting Lipid Panel Recent Labs    08/30/24 0408  CHOL 100  HDL 34*  LDLCALC 46  TRIG 898  CHOLHDL 2.9   Thyroid Function Tests No results for input(s): TSH, T4TOTAL, T3FREE, THYROIDAB in the last 72 hours.  Invalid input(s): FREET3  Other results:   Imaging    ECHOCARDIOGRAM LIMITED Result Date: 08/30/2024    ECHOCARDIOGRAM LIMITED REPORT   Patient Name:   [facility] Date of Exam: 08/30/2024 Medical Rec #:  [id number]      Height:       73.0 in Accession #:    [id number]     Weight:       226.9 lb Date of Birth:  01-25-1966      BSA:          2.270 m Patient Age:    58 years       BP:           101/50 mmHg Patient Gender: M              HR:           76 bpm. Exam Location:  Inpatient Procedure: Limited Echo, Cardiac Doppler and Color Doppler (Both Spectral and            Color Flow Doppler were utilized during procedure). Indications:    NSTEMI  History:        Patient has prior history of Echocardiogram examinations, most                  recent 08/27/2024. Acute MI, Prior Cardiac Surgery,                 Arrythmias:Atrial Fibrillation, Signs/Symptoms:Hypotension; Risk                 Factors:Dyslipidemia.  Sonographer:    Juliene Rucks Referring Phys: [phone] ALMA L DIAZ  Sonographer Comments: Technically difficult study due to poor echo windows. Image acquisition challenging due to patient body habitus. IMPRESSIONS  1. Left ventricular ejection fraction, by estimation, is 55 to 60%. The left ventricle has normal function. Left ventricular endocardial border not optimally defined to evaluate regional wall motion. The left ventricular internal cavity size was moderately dilated. There is mild concentric left ventricular hypertrophy. Left ventricular diastolic function could not be evaluated.  2. Right ventricular systolic function was not well visualized. The right ventricular size is not well visualized.  3. The mitral valve is degenerative. No evidence of mitral valve regurgitation. No evidence of mitral stenosis. Moderate to severe mitral annular calcification.  4. The aortic valve is calcified. There is severe calcifcation of the aortic valve. Aortic valve regurgitation is not visualized. Aortic valve sclerosis/calcification is present, without any evidence of aortic stenosis.  5. The inferior vena cava is normal in size with greater than 50% respiratory variability, suggesting right atrial pressure of 3 mmHg. FINDINGS  Left Ventricle: Left ventricular ejection fraction, by estimation, is 55 to 60%. The left ventricle has normal function. Left ventricular endocardial border not optimally defined to evaluate regional wall motion. The left ventricular internal cavity size was moderately dilated. There is mild concentric left ventricular hypertrophy. Left ventricular diastolic function could not be evaluated. Right Ventricle: The right ventricular size is not well  visualized. Right vetricular wall thickness was not well visualized. Right  ventricular systolic function was not well visualized. Left Atrium: Left atrial size was not assessed. Right Atrium: Right atrial size was not assessed. Pericardium: There is no evidence of pericardial effusion. Mitral Valve: The mitral valve is degenerative in appearance. Moderate to severe mitral annular calcification. No evidence of mitral valve stenosis. Tricuspid Valve: The tricuspid valve is not well visualized. Aortic Valve: The aortic valve is calcified. There is severe calcifcation of the aortic valve. Aortic valve regurgitation is not visualized. Aortic valve sclerosis/calcification is present, without any evidence of aortic stenosis. Pulmonic Valve: The pulmonic valve was not well visualized. Pulmonic valve regurgitation is not visualized. No evidence of pulmonic stenosis. Aorta: The ascending aorta was not well visualized and the aortic root is normal in size and structure. Venous: The inferior vena cava is normal in size with greater than 50% respiratory variability, suggesting right atrial pressure of 3 mmHg. IAS/Shunts: No atrial level shunt detected by color flow Doppler. LEFT VENTRICLE PLAX 2D LVIDd:         6.60 cm LVIDs:         5.00 cm LV PW:         1.30 cm LV IVS:        1.30 cm LVOT diam:     2.10 cm LVOT Area:     3.46 cm   AORTA Ao Root diam: 3.30 cm  SHUNTS Systemic Diam: 2.10 cm Kardie Tobb DO Electronically signed by Kardie Tobb DO Signature Date/Time: 08/30/2024/4:52:23 PM    Final      Medications:     Scheduled Medications:  allopurinol  100 mg Oral q AM   amiodarone   200 mg Oral BID   aspirin  EC  81 mg Oral Daily   atorvastatin   80 mg Oral QHS   calcitRIOL   1 mcg Oral Q M,W,F-HD   Chlorhexidine  Gluconate Cloth  6 each Topical Daily   clopidogrel   75 mg Oral Q breakfast   docusate sodium   100 mg Oral BID   famotidine   10 mg Oral QHS   feeding supplement (NEPRO CARB STEADY)  237 mL Oral BID BM   ferric citrate   420 mg Oral TID WC   [name]   Topical BID    heparin   5,000 Units Subcutaneous Q8H   hydrocortisone  sod succinate (SOLU-CORTEF ) inj  100 mg Intravenous Q12H   linezolid   600 mg Oral Q12H   midodrine   15 mg Oral Q8H   mouth rinse  15 mL Mouth Rinse 4 times per day   oxidized cellulose  1 each Topical Once   pantoprazole   40 mg Oral Daily   QUEtiapine  50 mg Oral QHS   senna  1 tablet Oral BID   sodium chloride  flush  3 mL Intravenous Q12H   tamsulosin   0.4 mg Oral QPM    Infusions:  sodium chloride  10 mL/hr at 08/30/24 0700   DOBUTamine 2.5 mcg/kg/min (08/31/24 1000)   meropenem (MERREM) IV Stopped (08/31/24 9388)   norepinephrine  (LEVOPHED ) Adult infusion 10 mcg/min (08/31/24 1000)   prismasol BGK 4/2.5 1,500 mL/hr at 08/31/24 1006   prismasol BGK 4/2.5 400 mL/hr at 08/31/24 0316   prismasol BGK 4/2.5 400 mL/hr at 08/31/24 0314   vasopressin  0.04 Units/min (08/31/24 1000)    PRN Medications: sodium chloride , acetaminophen , bisacodyl, haloperidol lactate, heparin , mouth rinse, oxyCODONE , polyethylene glycol, sodium chloride  flush    Patient Profile   Karas Pickerill is a 58 y.o.  AAM with chronic HFpEF, PAF s/p watchman, pSVT, ESRD on iHD, chronic hypotension on midodrine , hx small AVMs, CAD s/p DES to LAD and LCx, hx DVT and OSA on CPAP. AHF team to see for cardiogenic shock with RV failure, s/p NSTEMI, chronic HFpEF.   Assessment/Plan   1. Shock: Suspect mixed cardiogenic and septic shock with low SVR initially.  DBA added for low co-ox of 49%.  Today, remains on NE at 10 (down from 18) + VP 0.04 + DBA 2.5. Co-ox 61%, FICK CI 2.6, SVR still low at 662  - continue NE + VP support, wean NE as tolerated. Keep MAP > 65 - continue Midodrine  at 15 tid  - continue DBA at 2.5  - Broad spectrum abx.  2. RV failure: Echo showed EF >75% with severe RV dilation and severe RV dysfunction.  RHC 10/13 showed severe pulmonary arterial hypertension with CVP 19 and PCWP 9, significant RV failure.  The RV dysfunction is not explained by  coronary disease (no obstructive RCA disease).  He has a history of pulmonary hypertension (see #3 below).  I suspect RV dysfunction is from long-standing pulmonary hypertension.  He was on sildenafil in the past but not on it now. Unable to tolerate currently w/ hypotension  - continue DBA for RV support, Co-ox 65%  - CVP 11. CVVH for volume removal, pulling 10 cc/hr  - Pulmonary HTN management as below.  3. Pulmonary hypertension: Pulmonary arterial hypertension based on RHC 10/14 with PA 72/34, PVR 6.21.  He says that he was followed by a PH specialist in the past at [facility] and did 6 minute walks regularly.  He says he was told that he had PH because he was on dialysis.  He remembers taking sildenafil at some point.  CTA chest this admission did not show PE, there was scarring and pleural plaquing but no definite ILD or sarcoidosis.  - When BP is more stable, would be reasonable to start him on sildenafil 20 tid.  - Would get V/Q scan to look for chronic PEs when he is more stable.  4. ESRD: Patient failed prior renal transplant, was being worked up for another transplant. Significant right-sided volume overload with CVP 11  - Continue CVVH, aim for UF net negative 100-150 cc/hr.  5. Atrial arrhythmias: Patient has history of paroxysmal atrial fibrillation and has a Watchman in place. In AF w/ CVR today - Continue Amiodarone  200 mg bid for now. Stop if he re-develops bradycardia.  - No systemic anticoagulation with Watchman.  6. Bradycardia: Patient had transient bradycardia in setting of hypotension and mental status changes on 10/13.  Follow telemetry closely for recurrence.  7. CAD: NSTEMI with TnI > 24000 on 10/13.  He had no chest pain.  LHC showed 99% in-stent restenosis in the mid LAD treated with DES.  He had severe OM disease as well, managed medically.  No significant RCA disease.  I cannot explain his failed RV by CAD. Limited echo 10/15 EF 55-60%, RV not well visualized  - Continue ASA 81  and Plavix  75 - Continue atorvastatin  80.  8. ID: Recent ESBL E coli bacteremia, recent TMA right foot for gangrene. He has been hypotensive with SVR 690, suspect component of septic/distributive shock. PCT was high even in setting of ESRD (46).  - Continue linezolid  + Meropenem.    CRITICAL CARE Performed by: Caffie Shed   Total critical care time: 15 minutes  Critical care time was exclusive of separately billable procedures and  treating other patients.  Critical care was necessary to treat or prevent imminent or life-threatening deterioration.  Critical care was time spent personally by me on the following activities: development of treatment plan with patient and/or surrogate as well as nursing, discussions with consultants, evaluation of patient's response to treatment, examination of patient, obtaining history from patient or surrogate, ordering and performing treatments and interventions, ordering and review of laboratory studies, ordering and review of radiographic studies, pulse oximetry and re-evaluation of patient's condition.  Length of Stay: [address] [place]  08/31/2024, 10:51 AM  Advanced Heart Failure Team Pager [phone] (M-F; 7a - 5p)  Please contact [facility] for night-coverage after hours (5p -7a ) and weekends on [email or web address]   Patient seen with PA, I formulated the plan and agree with the above note.   Echo yesterday showed stable LV EF 55-60%, RV not visualized.  I/Os net negative 3758 cc yesterday, CVP still 14 on my read today.  Co-ox 61%, he is on NE 10, dobutamine 2.5, and vasopressin  0.04.  SVR remains low at 662.  PCT 46 => 23.   He is in atrial fibrillation today.   He continues to linezolid /meropenem.   Patient is alert and oriented but is having visual hallucinations.   General: NAD Neck: JVP 12-14 cm, no thyromegaly or thyroid nodule.  Lungs: Clear to auscultation bilaterally with normal respiratory effort. CV: Nondisplaced  PMI.  Heart irregular S1/S2, no S3/S4, no murmur.  No peripheral edema.   Abdomen: Soft, nontender, no hepatosplenomegaly, no distention.  Skin: Intact without lesions or rashes.  Neurologic: Alert and oriented x 3.  Psych: Normal affect. Extremities: No clubbing or cyanosis.  HEENT: Normal.   Suspect mixed septic/cardiogenic (RV) shock.  Co-ox 61%, CVP 14 this morning. SVR remains low, high PCT.  - Continue dobutamine 2.5 - Wean NE and vasopressin  as able.  - Continue CVVH, for now will aim for UF net negative 100 cc/hr.  - Continue linezolid /meropenem for broad spectrum coverage.   Alert/oriented but having visual hallucinations.  ABG did not show hypercarbia, NH3 normal.  ?Related to ongoing infection with delirium.   Persistent atrial fibrillation, generally rate controlled.  Can continue po amiodarone  for now.  Has Watchman.   CRITICAL CARE Performed by: Ezra Shuck  Total critical care time: 40 minutes  Critical care time was exclusive of separately billable procedures and treating other patients.  Critical care was necessary to treat or prevent imminent or life-threatening deterioration.  Critical care was time spent personally by me on the following activities: development of treatment plan with patient and/or surrogate as well as nursing, discussions with consultants, evaluation of patient's response to treatment, examination of patient, obtaining history from patient or surrogate, ordering and performing treatments and interventions, ordering and review of laboratory studies, ordering and review of radiographic studies, pulse oximetry and re-evaluation of patient's condition.  Ezra Shuck 08/31/2024 12:20 PM

## 2024-08-31 NOTE — Progress Notes (Incomplete)
 Nutrition Follow-up  DOCUMENTATION CODES:   Not applicable  INTERVENTION:   Liberalize diet to    NUTRITION DIAGNOSIS:   Increased nutrient needs related to catabolic illness as evidenced by  (pt with ESRD on HD at baseline, now on CRRT).  ***  GOAL:   Patient will meet greater than or equal to 90% of their needs  ***  MONITOR:   PO intake, Supplement acceptance, Labs  REASON FOR ASSESSMENT:   Consult Assessment of nutrition requirement/status  ASSESSMENT:   PMH significant for ESRD on iHD (MWF), chronic hypotension on Midodrine , hx of small AVMs, paroxysmal a. 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  ***  Labs: Phosphorus 2.1 (L) Magnesium  2.5 (H) Potassium 3.5 (wdl) Sodium 133  Meds:   NUTRITION - FOCUSED PHYSICAL EXAM:  {RD Focused Exam List:21252}  Diet Order:   Diet Order             Diet renal with fluid restriction Fluid restriction: 1200 mL Fluid; Room service appropriate? Yes; Fluid consistency: Thin  Diet effective now                   EDUCATION NEEDS:   Education needs have been addressed  Skin:  Skin Assessment: Reviewed RN Assessment  Last BM:  10/14 type 6  Height:   Ht Readings from Last 1 Encounters:  08/25/24 6' 0.99 (1.854 m)    Weight:   Wt Readings from Last 1 Encounters:  08/31/24 93.7 kg    Ideal Body Weight:  80.9 kg  BMI:  Body mass index is 27.26 kg/m.  Estimated Nutritional Needs:   Kcal:  2400-2900 kcals  Protein:  145-192 gm  Fluid:  1.2L/d   Betsey Finger MS, RDN, LDN, CNSC Registered Dietitian 3 Clinical Nutrition RD Inpatient Contact Info in Amion

## 2024-08-31 NOTE — Progress Notes (Signed)
 eLink Physician-Brief Progress Note Patient Name: [name] DOB: [birth year] MRN: [id number]   Date of Service  08/31/2024  HPI/Events of Note  Patient with worsening agitated delirium, there is concern that he might pull out his dialysis catheter or disrupt the lines.  eICU Interventions  Stat ABG, CBG and Ammonia level, Haldol 2 mg iv x 1.        Sua Spadafora U Fortune Brannigan 08/31/2024, 2:30 AM

## 2024-08-31 NOTE — Progress Notes (Signed)
 NAME:  [name], MRN:  [id number], DOB:  November 08, 1966, LOS: 3 ADMISSION DATE:  08/25/2024, CONSULTATION DATE:  08/27/24 REFERRING MD:  Dr. Camellia Door, CHIEF COMPLAINT:  bradycardia, hypotension   History of Present Illness:  Mr. [name] is a 58 y/o M with a PMH significant for ESRD on iHD (MWF), chronic hypotension on Midodrine , hx of small AVMs, paroxysmal a. Fib s/p watchman not on [facility] who presented to the orthopedic clinic for R foot gangrene with a recent hx of ESBL Bacteremia and sepsis presumed to be due to osteomyelitis now s/p R transmetatarsal amputation on 08/25/2024. This AM he developed some confusion, bradycardia, and hypotension requiring atropine administration and vasopressors. The patient was moved to ICU for hemodynamic monitoring and further care   Pertinent  Medical History  As above  Significant Hospital Events: Including procedures, antibiotic start and stop dates in addition to other pertinent events   08/25/2024 admission to the hospital for R transmetatarsal amputation 08/27/2024 admitted to ICU for hypotension and bradycardia, CTA PE negative. TTE with severely enlarged R ventricle, new. NSTEMI with troponin >24K; cardiology started on heparin  gtt and ASA 10/14 underwent cardiac cath and LAD stenting, remain on epinephrine  and norepinephrine  10/15 patient is requiring multiple vasopressor support, started on CRRT.  Serum troponin remain elevated >24k 10/16 remain on vasopressor support with norepinephrine  and vasopressin .  Epinephrine  was titrated off.  Remain on CRRT with net -300 cc. COOX 46%.  Went into A-fib with RVR requiring amiodarone  infusion   Interim History / Subjective:  Patient became confused, hallucinating and agitated intermittently Remained afebrile Vasopressor requirement is improving, currently on 10 mics per minute Levophed  and vasopressin  at 0.04 unit Stated breathing is better.  On CRRT  Objective   Blood pressure (!) 99/17, pulse (!) 115,  temperature 98.2 F (36.8 C), temperature source Axillary, resp. rate 19, height 6' 0.99 (1.854 m), weight 93.7 kg, SpO2 98%. CVP:  [0 mmHg-75 mmHg] 24 mmHg      Intake/Output Summary (Last 24 hours) at 08/31/2024 0756 Last data filed at 08/31/2024 0700 Gross per 24 hour  Intake 1316.05 ml  Output 5074 ml  Net -3757.95 ml   Filed Weights   08/28/24 0428 08/30/24 0500 08/31/24 0500  Weight: 96.4 kg 102.9 kg 93.7 kg    Examination: General: Middle-aged male, lying on the bed HEENT: [place]/AT, eyes anicteric.  moist mucus membranes Neuro: Awake, oriented, intermittently hallucinating Chest: Reduced air entry at the bases right more than left, no wheezes or rhonchi Heart: Tachycardic, regular rhythm, no murmurs or gallops Abdomen: Soft, nontender, nondistended, bowel sounds present Extremities: Left upper extremity AV fistula present, right lower extremity forefoot is amputated, well-dressed   Labs and images reviewed  Patient Lines/Drains/Airways Status     Active Line/Drains/Airways     Name Placement date Placement time Site Days   Arterial Line 08/27/24 Right Femoral 08/27/24  1200  Femoral  4   CVC Triple Lumen 08/27/24 Left Femoral 08/27/24  1500  -- 4   Fistula / Graft Left Forearm Arteriovenous fistula --  --  Forearm  --   Hemodialysis Catheter Right Internal jugular Triple lumen Temporary (Non-Tunneled) 08/29/24  1000  Internal jugular  2   Midline Single Lumen 08/17/24 Right Cephalic 8 cm 0 cm 08/17/24  1541  Cephalic  14   Negative Pressure Wound Therapy Leg Left;Lower 11/30/22  1518  --  640   Negative Pressure Wound Therapy Leg Left;Lower;Anterior 03/22/23  1427  --  528   Wound  08/12/24 2300 Atypical Toe (Comment  which one) Anterior;Right 08/12/24  2300  Toe (Comment  which one)  19   Wound 08/25/24 1330 Other (Comment) Toe (Comment  which one) Anterior;Left 08/25/24  1330  Toe (Comment  which one)  6   Wound 08/29/24 0800 Pressure Injury Buttocks Right Deep Tissue  Pressure Injury - Purple or maroon localized area of discolored intact skin or blood-filled blister due to damage of underlying soft tissue from pressure and/or shear. 08/29/24  0800  Buttocks  2   Wound 08/29/24 0800 Pressure Injury Buttocks Left Deep Tissue Pressure Injury - Purple or maroon localized area of discolored intact skin or blood-filled blister due to damage of underlying soft tissue from pressure and/or shear. 08/29/24  0800  Buttocks  2          Resolved Hospital Problem list   Hypoglycemia Symptomatic bradycardia, resolved Lactic acidosis  Assessment & Plan:  Acute on chronic cor pulmonale with cardiogenic shock Acute NSTEMI status post DES to LAD Severe pulmonary hypertension Mixed cardiogenic and septic shock, improving Hypervolemic hyponatremia Paroxysmal A-fib status post Watchman device, currently in sinus rhythm QTc prolongation, resolved. Recent history of ESBL E. coli bacteremia and right forefoot osteomyelitis status post amputation End-stage renal disease on hemodialysi on CRRT Deep tissue injury on bilateral buttock, unable to determine if it was present on admission Anemia of renal disease Acute hyperactive delirium  Appreciate advanced heart failure team follow-up Started on dobutamine at 2.5, Coox 61%, improved from yesterday 46% Vasopressor requirement is improving, currently on 10 mics of norepinephrine  and vasopressin  at 0.04 Continue to titrate with MAP goal 65 Restarted back on midodrine  15 mg 3 times daily Continue aspirin  Plavix  and statin GDMT is limited by shock Converted back to sinus rhythm Continue telemetry monitoring Continue amiodarone  Continue broad-spectrum antibiotics with Zyvox  and meropenem to complete 5 days of therapy His SVR remained low which suggests septic picture was there along with cardiogenic Will repeat procalcitonin Cultures have been negative Remain on CRRT with net -3.7 L in last 24 hours Continue wound  care Hemoglobin stable around 9 Monitor H&H and transfuse if less than 7 Received Zyprexa overnight, will start Seroquel 50 mg nightly scheduled Changed tramadol to low-dose oxycodone    Labs   CBC: Recent Labs  Lab 08/27/24 0956 08/27/24 1303 08/28/24 0232 08/28/24 2107 08/29/24 0344 08/30/24 0810 08/31/24 0236 08/31/24 0244  WBC 15.8*   < > 12.6* 16.2* 17.1* 13.7* 13.8*  --   NEUTROABS 12.7*  --   --   --  14.0*  --   --   --   HGB 9.0*   < > 9.1* 8.6* 8.9* 8.9* 8.2* 9.5*  HCT 31.4*   < > 28.6* 27.4* 27.7* 28.2* 26.3* 28.0*  MCV 102.6*   < > 92.0 91.9 91.4 92.2 92.6  --   PLT 326   < > 370 346 345 342 323  --    < > = values in this interval not displayed.    Basic Metabolic Panel: Recent Labs  Lab 08/27/24 1814 08/27/24 2024 08/28/24 0232 08/28/24 1101 08/28/24 1447 08/28/24 2107 08/29/24 0344 08/29/24 1555 08/30/24 0408 08/30/24 1557 08/31/24 0236 08/31/24 0244  NA 132*  --  132*  --  129*  --  126* 129* 129* 132* 133* 135  K 4.4   < > 3.3*   < > 3.7   < > 3.5 3.9 4.6 4.0 3.5 3.6  CL 95*  --  91*  --  90*  --  90* 92* 94* 96* 98  --   CO2 17*  --  22  --  21*  --  20* 21* 19* 21* 21*  --   GLUCOSE 142*  --  145*  --  149*  --  136* 115* 134* 147* 144*  --   BUN 56*  --  28*  --  37*  --  43* 39* 30* 25* 24*  --   CREATININE 12.28*  --  7.15*  --  8.63*   < > 9.15* 8.38* 5.75* 4.47* 3.76*  --   CALCIUM  6.9*  --  7.7*  --  7.0*  --  6.7* 6.6* 7.1* 7.3* 7.5*  --   MG 2.0  --  2.4  --   --   --   --   --  2.3  --  2.5*  --   PHOS 5.2*  --  4.2  --  3.8  --   --  4.0 3.5 2.9 2.1*  --    < > = values in this interval not displayed.   GFR: Estimated Creatinine Clearance: 24.8 mL/min (A) (by C-G formula based on SCr of 3.76 mg/dL (H)). Recent Labs  Lab 08/28/24 1101 08/28/24 1447 08/28/24 2107 08/29/24 0344 08/29/24 0913 08/29/24 1336 08/30/24 0810 08/31/24 0236  PROCALCITON 45.77  --   --   --   --   --   --   --   WBC  --   --  16.2* 17.1*  --   --   13.7* 13.8*  LATICACIDVEN 3.1* 3.3*  --   --  1.3 1.6  --   --     Liver Function Tests: Recent Labs  Lab 08/27/24 1814 08/28/24 1447 08/29/24 0344 08/29/24 1555 08/30/24 0408 08/30/24 1557 08/31/24 0236  AST 287*  --  1,820*  --   --   --   --   ALT 30  --  90*  --   --   --   --   ALKPHOS 174*  --  208*  --   --   --   --   BILITOT 0.8  --  0.9  --   --   --   --   PROT 6.4*  --  6.5  --   --   --   --   ALBUMIN  2.6*   < > 2.5* 2.6* 2.6* 2.7* 2.7*   < > = values in this interval not displayed.   Recent Labs  Lab 08/28/24 1101  LIPASE 28   Recent Labs  Lab 08/31/24 0236  AMMONIA 23    ABG    Component Value Date/Time   PHART 7.439 08/31/2024 0244   PCO2ART 35.1 08/31/2024 0244   PO2ART 73 (L) 08/31/2024 0244   HCO3 23.8 08/31/2024 0244   TCO2 25 08/31/2024 0244   ACIDBASEDEF 14.0 (H) 08/27/2024 1303   O2SAT 61.3 08/31/2024 0524     Coagulation Profile: No results for input(s): INR, PROTIME in the last 168 hours.  Cardiac Enzymes: No results for input(s): CKTOTAL, CKMB, CKMBINDEX, TROPONINI in the last 168 hours.  HbA1C: Hgb A1c MFr Bld  Date/Time Value Ref Range Status  08/30/2024 04:08 AM 5.7 (H) 4.8 - 5.6 % Final    Comment:    (NOTE) Diagnosis of Diabetes The following HbA1c ranges recommended by the American Diabetes Association (ADA) may be used as an aid in the diagnosis of diabetes mellitus.  Hemoglobin  Suggested A1C NGSP%              Diagnosis  <5.7                   Non Diabetic  5.7-6.4                Pre-Diabetic  >6.4                   Diabetic  <7.0                   Glycemic control for                       adults with diabetes.    03/31/2021 05:38 AM 5.6 4.8 - 5.6 % Final    Comment:    (NOTE) Pre diabetes:          5.7%-6.4%  Diabetes:              >6.4%  Glycemic control for   <7.0% adults with diabetes     CBG: Recent Labs  Lab 08/30/24 0807 08/30/24 1135 08/30/24 1600  08/30/24 1945 08/31/24 0242  GLUCAP 146* 129* 142* 129* 140*    The patient is critically ill due to acute NSTEMI/cardiogenic shock requiring titration of vasopressor support.  Critical care was necessary to treat or prevent imminent or life-threatening deterioration.  Critical care was time spent personally by me on the following activities: development of treatment plan with patient and/or surrogate as well as nursing, discussions with consultants, evaluation of patient's response to treatment, examination of patient, obtaining history from patient or surrogate, ordering and performing treatments and interventions, ordering and review of laboratory studies, ordering and review of radiographic studies, pulse oximetry, re-evaluation of patient's condition and participation in multidisciplinary rounds.   During this encounter critical care time was devoted to patient care services described in this note for 36 minutes.     Valinda Novas, MD [facility] See Amion for pager If no response to pager, please call [phone] until 7pm After 7pm, Please call E-link [phone]

## 2024-09-01 DIAGNOSIS — I214 Non-ST elevation (NSTEMI) myocardial infarction: Secondary | ICD-10-CM | POA: Diagnosis not present

## 2024-09-01 DIAGNOSIS — I2781 Cor pulmonale (chronic): Secondary | ICD-10-CM | POA: Diagnosis not present

## 2024-09-01 DIAGNOSIS — N186 End stage renal disease: Secondary | ICD-10-CM | POA: Diagnosis not present

## 2024-09-01 DIAGNOSIS — Z1612 Extended spectrum beta lactamase (ESBL) resistance: Secondary | ICD-10-CM | POA: Diagnosis not present

## 2024-09-01 DIAGNOSIS — I50810 Right heart failure, unspecified: Secondary | ICD-10-CM | POA: Diagnosis not present

## 2024-09-01 DIAGNOSIS — I272 Pulmonary hypertension, unspecified: Secondary | ICD-10-CM | POA: Diagnosis not present

## 2024-09-01 DIAGNOSIS — R570 Cardiogenic shock: Secondary | ICD-10-CM | POA: Diagnosis not present

## 2024-09-01 LAB — CBC
HCT: 28.2 % — ABNORMAL LOW (ref 39.0–52.0)
Hemoglobin: 8.7 g/dL — ABNORMAL LOW (ref 13.0–17.0)
MCH: 29.2 pg (ref 26.0–34.0)
MCHC: 30.9 g/dL (ref 30.0–36.0)
MCV: 94.6 fL (ref 80.0–100.0)
Platelets: 281 K/uL (ref 150–400)
RBC: 2.98 MIL/uL — ABNORMAL LOW (ref 4.22–5.81)
RDW: 17.8 % — ABNORMAL HIGH (ref 11.5–15.5)
WBC: 14.2 K/uL — ABNORMAL HIGH (ref 4.0–10.5)
nRBC: 0 % (ref 0.0–0.2)

## 2024-09-01 LAB — RENAL FUNCTION PANEL
Albumin: 2.8 g/dL — ABNORMAL LOW (ref 3.5–5.0)
Albumin: 2.6 g/dL — ABNORMAL LOW (ref 3.5–5.0)
Anion gap: 11 (ref 5–15)
Anion gap: 9 (ref 5–15)
BUN: 17 mg/dL (ref 6–20)
BUN: 16 mg/dL (ref 6–20)
CO2: 23 mmol/L (ref 22–32)
CO2: 23 mmol/L (ref 22–32)
Calcium: 7.7 mg/dL — ABNORMAL LOW (ref 8.9–10.3)
Calcium: 7.5 mg/dL — ABNORMAL LOW (ref 8.9–10.3)
Chloride: 102 mmol/L (ref 98–111)
Chloride: 103 mmol/L (ref 98–111)
Creatinine, Ser: 2.51 mg/dL — ABNORMAL HIGH (ref 0.61–1.24)
Creatinine, Ser: 2.37 mg/dL — ABNORMAL HIGH (ref 0.61–1.24)
GFR, Estimated: 29 mL/min — ABNORMAL LOW (ref >60)
GFR, Estimated: 31 mL/min — ABNORMAL LOW (ref >60)
Glucose, Bld: 126 mg/dL — ABNORMAL HIGH (ref 70–99)
Glucose, Bld: 108 mg/dL — ABNORMAL HIGH (ref 70–99)
Phosphorus: 2.6 mg/dL (ref 2.5–4.6)
Phosphorus: 2.1 mg/dL — ABNORMAL LOW (ref 2.5–4.6)
Potassium: 4 mmol/L (ref 3.5–5.1)
Potassium: 3.6 mmol/L (ref 3.5–5.1)
Sodium: 136 mmol/L (ref 135–145)
Sodium: 135 mmol/L (ref 135–145)

## 2024-09-01 LAB — CULTURE, BLOOD (ROUTINE X 2)
Culture: NO GROWTH
Culture: NO GROWTH

## 2024-09-01 LAB — GLUCOSE, CAPILLARY
Glucose-Capillary: 138 mg/dL — ABNORMAL HIGH (ref 70–99)
Glucose-Capillary: 138 mg/dL — ABNORMAL HIGH (ref 70–99)
Glucose-Capillary: 111 mg/dL — ABNORMAL HIGH (ref 70–99)
Glucose-Capillary: 108 mg/dL — ABNORMAL HIGH (ref 70–99)
Glucose-Capillary: 101 mg/dL — ABNORMAL HIGH (ref 70–99)
Glucose-Capillary: 100 mg/dL — ABNORMAL HIGH (ref 70–99)

## 2024-09-01 LAB — COOXEMETRY PANEL
Carboxyhemoglobin: 1.9 % — ABNORMAL HIGH (ref 0.5–1.5)
Carboxyhemoglobin: 1.6 % — ABNORMAL HIGH (ref 0.5–1.5)
Methemoglobin: 1 % (ref 0.0–1.5)
Methemoglobin: <0.7 % (ref 0.0–1.5)
O2 Saturation: 50.8 %
O2 Saturation: 60.6 %
Total hemoglobin: 9.3 g/dL — ABNORMAL LOW (ref 12.0–16.0)
Total hemoglobin: 9.3 g/dL — ABNORMAL LOW (ref 12.0–16.0)

## 2024-09-01 LAB — MAGNESIUM: Magnesium: 2.6 mg/dL — ABNORMAL HIGH (ref 1.7–2.4)

## 2024-09-01 MED ORDER — ORAL CARE MOUTH RINSE: 15.0000 mL | OROMUCOSAL | Status: DC | PRN

## 2024-09-01 NOTE — Progress Notes (Signed)
 [name], male   DOB: Dec 06, 1965, 58 y.o.   MRN: [id number]     Advanced Heart Failure Rounding Note  Cardiologist: None  Chief Complaint: Cardiogenic shock with RV failure post NSTEMI  Subjective:    Continues on DBA 2.5 + NE 2 + VP 0.02. Early am co-ox 51%.   On CVVH, currently pulling 150 cc/hr. CVP 10-11, I/Os net negative 2532 and down 3 lbs.    Rhythm difficult, may be AF in 60s (no definite P-waves).    On Precedex now, has been having visual hallucinations.   Objective:   Weight Range: 92.4 kg Body mass index is 26.88 kg/m.   Vital Signs:   Temp:  [97.6 F (36.4 C)-97.9 F (36.6 C)] 97.9 F (36.6 C) (10/18 0823) Pulse Rate:  [58-129] 74 (10/18 1030) Resp:  [15-32] 20 (10/18 1030) BP: (131)/(68) 131/68 (10/18 0823) SpO2:  [87 %-100 %] 100 % (10/18 1030) Arterial Line BP: (90-140)/(45-78) 98/45 (10/18 1030) Weight:  [92.4 kg] 92.4 kg (10/18 0500) Last BM Date : 09/01/24  Weight change: Filed Weights   08/30/24 0500 08/31/24 0500 09/01/24 0500  Weight: 102.9 kg 93.7 kg 92.4 kg   Intake/Output:   Intake/Output Summary (Last 24 hours) at 09/01/2024 1045 Last data filed at 09/01/2024 1000 Gross per 24 hour  Intake 1851.63 ml  Output 4373 ml  Net -2521.37 ml    Physical Exam   General: NAD Neck: JVP 10-12 cm, no thyromegaly or thyroid nodule.  Lungs: Clear to auscultation bilaterally with normal respiratory effort. CV: Nondisplaced PMI.  Heart irregular S1/S2, no S3/S4, no murmur.  No peripheral edema.    Abdomen: Soft, nontender, no hepatosplenomegaly, no distention.  Skin: Intact without lesions or rashes.  Neurologic: Drowsy but will awaken.  Extremities: No clubbing or cyanosis.  HEENT: Normal.   Telemetry   ?AF in 60s (Personally reviewed)   Labs    CBC Recent Labs    08/31/24 0236 08/31/24 0244 09/01/24 0417  WBC 13.8*  --  14.2*  HGB 8.2* 9.5* 8.7*  HCT 26.3* 28.0* 28.2*  MCV 92.6  --  94.6  PLT 323  --  281    Basic Metabolic Panel Recent Labs    89/82/74 0236 08/31/24 0244 08/31/24 1529 09/01/24 0417  NA 133*   < > 135 136  K 3.5   < > 3.4* 4.0  CL 98  --  100 102  CO2 21*  --  22 23  GLUCOSE 144*  --  143* 126*  BUN 24*  --  21* 17  CREATININE 3.76*  --  3.03* 2.51*  CALCIUM  7.5*  --  7.5* 7.7*  MG 2.5*  --   --  2.6*  PHOS 2.1*  --  1.8* 2.6   < > = values in this interval not displayed.   Liver Function Tests Recent Labs    08/31/24 1529 09/01/24 0417  ALBUMIN  2.7* 2.8*   No results for input(s): LIPASE, AMYLASE in the last 72 hours.  Cardiac Enzymes No results for input(s): CKTOTAL, CKMB, CKMBINDEX, TROPONINI in the last 72 hours.  BNP: BNP (last 3 results) Recent Labs    08/27/24 0956  BNP 569.7*    ProBNP (last 3 results) No results for input(s): PROBNP in the last 8760 hours.   D-Dimer No results for input(s): DDIMER in the last 72 hours. Hemoglobin A1C Recent Labs    08/30/24 0408  HGBA1C 5.7*   Fasting Lipid Panel Recent Labs  08/30/24 0408  CHOL 100  HDL 34*  LDLCALC 46  TRIG 898  CHOLHDL 2.9   Thyroid Function Tests No results for input(s): TSH, T4TOTAL, T3FREE, THYROIDAB in the last 72 hours.  Invalid input(s): FREET3  Other results:   Imaging    No results found.    Medications:     Scheduled Medications:  allopurinol  100 mg Oral q AM   amiodarone   200 mg Oral BID   aspirin  EC  81 mg Oral Daily   atorvastatin   80 mg Oral QHS   calcitRIOL   1 mcg Oral Q M,W,F-HD   Chlorhexidine  Gluconate Cloth  6 each Topical Daily   clopidogrel   75 mg Oral Q breakfast   famotidine   10 mg Oral QHS   feeding supplement (NEPRO CARB STEADY)  237 mL Oral BID BM   ferric citrate   420 mg Oral TID WC   [name]   Topical BID   heparin   5,000 Units Subcutaneous Q8H   linezolid   600 mg Oral Q12H   midodrine   15 mg Oral Q8H   oxidized cellulose  1 each Topical Once   pantoprazole   40 mg Oral Daily    QUEtiapine  50 mg Oral BID   sodium chloride  flush  3 mL Intravenous Q12H   tamsulosin   0.4 mg Oral QPM    Infusions:  sodium chloride  10 mL/hr at 08/30/24 0700   dexmedetomidine (PRECEDEX) IV infusion 0.4 mcg/kg/hr (09/01/24 1000)   DOBUTamine 2.5 mcg/kg/min (09/01/24 1000)   meropenem (MERREM) IV Stopped (09/01/24 0604)   norepinephrine  (LEVOPHED ) Adult infusion 2 mcg/min (09/01/24 1000)   prismasol BGK 4/2.5 1,500 mL/hr at 09/01/24 0956   prismasol BGK 4/2.5 400 mL/hr at 09/01/24 0440   prismasol BGK 4/2.5 400 mL/hr at 09/01/24 0440   vasopressin  Stopped (09/01/24 0929)    PRN Medications: sodium chloride , acetaminophen , bisacodyl, heparin , mouth rinse, polyethylene glycol, sodium chloride  flush    Patient Profile   [name] is a 58 y.o. AAM with chronic HFpEF, PAF s/p watchman, pSVT, ESRD on iHD, chronic hypotension on midodrine , hx small AVMs, CAD s/p DES to LAD and LCx, hx DVT and OSA on CPAP. AHF team to see for cardiogenic shock with RV failure, s/p NSTEMI, chronic HFpEF.   Assessment/Plan   1. Shock: Suspect mixed cardiogenic and septic shock with low SVR initially.  DBA added for low co-ox of 49%.  Today, remains on NE at 2 + VP 0.02 + DBA 2.5. Co-ox 51% (early am).  - Resend co-ox, will continue dobutamine without weaning.  - Can wean NE and VP as BP tolerates.  - continue Midodrine  at 15 tid (on this at home).  - continue DBA at 2.5  - Broad spectrum abx.  2. RV failure: Echo showed EF >75% with severe RV dilation and severe RV dysfunction.  RHC 10/13 showed severe pulmonary arterial hypertension with CVP 19 and PCWP 9, significant RV failure.  The RV dysfunction is not explained by coronary disease (no obstructive RCA disease).  He has a history of pulmonary hypertension (see #3 below).  I suspect RV dysfunction is from long-standing pulmonary hypertension.  He was on sildenafil in the past but not on it now. Unable to tolerate currently w/ hypotension. CVP  10-11 today.  - continue DBA for RV support.  - CVVH for volume removal, aim for UF net negative 100 cc/hr today.  - Pulmonary HTN management as below.  3. Pulmonary hypertension: Pulmonary arterial hypertension based on RHC  10/14 with PA 72/34, PVR 6.21.  He says that he was followed by a PH specialist in the past at [facility] and did 6 minute walks regularly.  He says he was told that he had PH because he was on dialysis.  He remembers taking sildenafil at some point.  CTA chest this admission did not show PE, there was scarring and pleural plaquing but no definite ILD or sarcoidosis.  - When BP is more stable, would be reasonable to start him on sildenafil 20 tid.  - Would get V/Q scan to look for chronic PEs when he is more stable.  4. ESRD: Patient failed prior renal transplant, was being worked up for another transplant. Significant right-sided volume overload with CVP 10-11  - Continue CVVH, aim for UF net negative 100 cc/hr today.  5. Atrial arrhythmias: Patient has history of paroxysmal atrial fibrillation and has a Watchman in place. Looks like atrial fibrillation today.  - ECG to confirm rhythm.  - Continue Amiodarone  200 mg bid for now. Stop if he re-develops bradycardia.  - No systemic anticoagulation with Watchman.  6. Bradycardia: Patient had transient bradycardia in setting of hypotension and mental status changes on 10/13.  Follow telemetry closely for recurrence.  7. CAD: NSTEMI with TnI > 24000 on 10/13.  He had no chest pain.  LHC showed 99% in-stent restenosis in the mid LAD treated with DES.  He had severe OM disease as well, managed medically.  No significant RCA disease.  I cannot explain his failed RV by CAD. Limited echo 10/15 EF 55-60%, RV not well visualized  - Continue ASA 81 and Plavix  75 - Continue atorvastatin  80.  8. ID: Recent ESBL E coli bacteremia, recent TMA right foot for gangrene. He has been hypotensive with SVR 690, suspect component of septic/distributive shock.  PCT was high even in setting of ESRD (46).  - Continue linezolid  + Meropenem.  9. Delirium: With visual hallucinations.   ABG did not show hypercarbia, NH3 normal.  ?Related to ongoing infection with delirium.  Currently on Precedex.  - Per CCM.    CRITICAL CARE Performed by: Ezra Shuck  Total critical care time: 40 minutes  Critical care time was exclusive of separately billable procedures and treating other patients.  Critical care was necessary to treat or prevent imminent or life-threatening deterioration.  Critical care was time spent personally by me on the following activities: development of treatment plan with patient and/or surrogate as well as nursing, discussions with consultants, evaluation of patient's response to treatment, examination of patient, obtaining history from patient or surrogate, ordering and performing treatments and interventions, ordering and review of laboratory studies, ordering and review of radiographic studies, pulse oximetry and re-evaluation of patient's condition.  Ezra Shuck 09/01/2024 10:45 AM

## 2024-09-01 NOTE — Progress Notes (Signed)
 NAME:  [name], MRN:  [id number], DOB:  [birth year], LOS: 4 ADMISSION DATE:  08/25/2024, CONSULTATION DATE:  08/27/24 REFERRING MD:  Dr. Camellia Door, CHIEF COMPLAINT:  bradycardia, hypotension   History of Present Illness:  [name] is a 58 y/o M with a PMH significant for ESRD on iHD (MWF), chronic hypotension on Midodrine , hx of small AVMs, paroxysmal a. Fib s/p watchman not on [facility] who presented to the orthopedic clinic for R foot gangrene with a recent hx of ESBL Bacteremia and sepsis presumed to be due to osteomyelitis now s/p R transmetatarsal amputation on 08/25/2024. This AM he developed some confusion, bradycardia, and hypotension requiring atropine administration and vasopressors. The patient was moved to ICU for hemodynamic monitoring and further care   Pertinent  Medical History  As above  Significant Hospital Events: Including procedures, antibiotic start and stop dates in addition to other pertinent events   08/25/2024 admission to the hospital for R transmetatarsal amputation 08/27/2024 admitted to ICU for hypotension and bradycardia, CTA PE negative. TTE with severely enlarged R ventricle, new. NSTEMI with troponin >24K; cardiology started on heparin  gtt and ASA 10/14 underwent cardiac cath and LAD stenting, remain on epinephrine  and norepinephrine  10/15 patient is requiring multiple vasopressor support, started on CRRT.  Serum troponin remain elevated >24k 10/16 remain on vasopressor support with norepinephrine  and vasopressin .  Epinephrine  was titrated off.  Remain on CRRT with net -300 cc. COOX 46%.  Went into A-fib with RVR requiring amiodarone  infusion 10/17 started getting confused and hallucinating, vasopressor requirement is improving, remain on CRRT with net negative fluid balance   Interim History / Subjective:  Patient became really agitated overnight, started pulling lines and tubes, was started on Precedex Remain on CRRT with net -2.5 L fluid balance Coox is  50% on dobutamine 2.5  Objective   Blood pressure (!) 99/17, pulse 69, temperature 97.6 F (36.4 C), temperature source Oral, resp. rate (!) 22, height 6' 0.99 (1.854 m), weight 92.4 kg, SpO2 100%. CVP:  [12 mmHg-30 mmHg] 23 mmHg      Intake/Output Summary (Last 24 hours) at 09/01/2024 0745 Last data filed at 09/01/2024 0700 Gross per 24 hour  Intake 1849.09 ml  Output 4381 ml  Net -2531.91 ml   Filed Weights   08/30/24 0500 08/31/24 0500 09/01/24 0500  Weight: 102.9 kg 93.7 kg 92.4 kg    Examination: General: Middle-aged male, lying on the bed, sedated HEENT: [place]/AT, eyes anicteric.  moist mucus membranes Neuro: Eyes closed, does not open, responding to painful stimuli Chest: Air entry is improving, no wheezes or rhonchi Heart: Regular rate and rhythm, no murmurs or gallops Abdomen: Soft, nontender, nondistended, bowel sounds present   Labs reviewed  Patient Lines/Drains/Airways Status     Active Line/Drains/Airways     Name Placement date Placement time Site Days   Arterial Line 08/27/24 Right Femoral 08/27/24  1200  Femoral  5   CVC Triple Lumen 08/27/24 Left Femoral 08/27/24  1500  -- 5   Fistula / Graft Left Forearm Arteriovenous fistula --  --  Forearm  --   Hemodialysis Catheter Right Internal jugular Triple lumen Temporary (Non-Tunneled) 08/29/24  1000  Internal jugular  3   Midline Single Lumen 08/17/24 Right Cephalic 8 cm 0 cm 08/17/24  1541  Cephalic  15   Negative Pressure Wound Therapy Leg Left;Lower 11/30/22  1518  --  641   Negative Pressure Wound Therapy Leg Left;Lower;Anterior 03/22/23  1427  --  529   Wound  08/12/24 2300 Atypical Toe (Comment  which one) Anterior;Right 08/12/24  2300  Toe (Comment  which one)  20   Wound 08/25/24 1330 Other (Comment) Toe (Comment  which one) Anterior;Left 08/25/24  1330  Toe (Comment  which one)  7   Wound 08/29/24 0800 Pressure Injury Buttocks Right Deep Tissue Pressure Injury - Purple or maroon localized area of  discolored intact skin or blood-filled blister due to damage of underlying soft tissue from pressure and/or shear. 08/29/24  0800  Buttocks  3   Wound 08/29/24 0800 Pressure Injury Buttocks Left Deep Tissue Pressure Injury - Purple or maroon localized area of discolored intact skin or blood-filled blister due to damage of underlying soft tissue from pressure and/or shear. 08/29/24  0800  Buttocks  3         Resolved Hospital Problem list   Hypoglycemia Symptomatic bradycardia, resolved Lactic acidosis QTc prolongation, resolved Hypervolemic hyponatremia  Assessment & Plan:  Acute on chronic cor pulmonale with cardiogenic shock Acute NSTEMI status post DES to LAD Severe pulmonary hypertension Mixed cardiogenic and septic shock, improving Paroxysmal A-fib status post Watchman device Recent history of ESBL E. coli bacteremia and right forefoot osteomyelitis status post amputation End-stage renal disease on hemodialysi on CRRT Deep tissue injury on bilateral buttock, unable to determine if it was present on admission Anemia of renal disease Acute hyperactive delirium  Appreciate advanced heart failure team follow-up Remains on dobutamine at 2.5, Coox went down to 50% this morning Vasopressor requirement is improving, currently on Levophed  at 3 mics and vasopressin  at 0.02 units Remain on CRRT with net -2.5 L in last 24 hours Continue aspirin  Plavix  and statin Remain in sinus rhythm Continue p.o. amiodarone  Continue broad-spectrum antibiotics to complete 5-day therapy, cultures have been negative Procalcitonin decreased from 45- - >>22 Continue to remove volume via CRRT Continue wound care H&H is stable between 8-9 Became really agitated yesterday evening, hallucinating and started pulling lines Started on Precedex, currently on 1 mic per KG per minute Will try to decrease to keep RASS goal 0 Continue Seroquel twice daily   Labs   CBC: Recent Labs  Lab 08/27/24 0956  08/27/24 1303 08/28/24 2107 08/29/24 0344 08/30/24 0810 08/31/24 0236 08/31/24 0244 09/01/24 0417  WBC 15.8*   < > 16.2* 17.1* 13.7* 13.8*  --  14.2*  NEUTROABS 12.7*  --   --  14.0*  --   --   --   --   HGB 9.0*   < > 8.6* 8.9* 8.9* 8.2* 9.5* 8.7*  HCT 31.4*   < > 27.4* 27.7* 28.2* 26.3* 28.0* 28.2*  MCV 102.6*   < > 91.9 91.4 92.2 92.6  --  94.6  PLT 326   < > 346 345 342 323  --  281   < > = values in this interval not displayed.    Basic Metabolic Panel: Recent Labs  Lab 08/27/24 1814 08/27/24 2024 08/28/24 0232 08/28/24 1101 08/30/24 0408 08/30/24 1557 08/31/24 0236 08/31/24 0244 08/31/24 1529 09/01/24 0417  NA 132*  --  132*   < > 129* 132* 133* 135 135 136  K 4.4   < > 3.3*   < > 4.6 4.0 3.5 3.6 3.4* 4.0  CL 95*  --  91*   < > 94* 96* 98  --  100 102  CO2 17*  --  22   < > 19* 21* 21*  --  22 23  GLUCOSE 142*  --  145*   < >  134* 147* 144*  --  143* 126*  BUN 56*  --  28*   < > 30* 25* 24*  --  21* 17  CREATININE 12.28*  --  7.15*   < > 5.75* 4.47* 3.76*  --  3.03* 2.51*  CALCIUM  6.9*  --  7.7*   < > 7.1* 7.3* 7.5*  --  7.5* 7.7*  MG 2.0  --  2.4  --  2.3  --  2.5*  --   --  2.6*  PHOS 5.2*  --  4.2   < > 3.5 2.9 2.1*  --  1.8* 2.6   < > = values in this interval not displayed.   GFR: Estimated Creatinine Clearance: 37.1 mL/min (A) (by C-G formula based on SCr of 2.51 mg/dL (H)). Recent Labs  Lab 08/28/24 1101 08/28/24 1447 08/28/24 2107 08/29/24 0344 08/29/24 0913 08/29/24 1336 08/30/24 0810 08/31/24 0236 08/31/24 0916 09/01/24 0417  PROCALCITON 45.77  --   --   --   --   --   --   --  22.24  --   WBC  --   --    < > 17.1*  --   --  13.7* 13.8*  --  14.2*  LATICACIDVEN 3.1* 3.3*  --   --  1.3 1.6  --   --   --   --    < > = values in this interval not displayed.    Liver Function Tests: Recent Labs  Lab 08/27/24 1814 08/28/24 1447 08/29/24 0344 08/29/24 1555 08/30/24 0408 08/30/24 1557 08/31/24 0236 08/31/24 1529 09/01/24 0417  AST  287*  --  1,820*  --   --   --   --   --   --   ALT 30  --  90*  --   --   --   --   --   --   ALKPHOS 174*  --  208*  --   --   --   --   --   --   BILITOT 0.8  --  0.9  --   --   --   --   --   --   PROT 6.4*  --  6.5  --   --   --   --   --   --   ALBUMIN  2.6*   < > 2.5*   < > 2.6* 2.7* 2.7* 2.7* 2.8*   < > = values in this interval not displayed.   Recent Labs  Lab 08/28/24 1101  LIPASE 28   Recent Labs  Lab 08/31/24 0236  AMMONIA 23    ABG    Component Value Date/Time   PHART 7.439 08/31/2024 0244   PCO2ART 35.1 08/31/2024 0244   PO2ART 73 (L) 08/31/2024 0244   HCO3 23.8 08/31/2024 0244   TCO2 25 08/31/2024 0244   ACIDBASEDEF 14.0 (H) 08/27/2024 1303   O2SAT 50.8 09/01/2024 0532     Coagulation Profile: No results for input(s): INR, PROTIME in the last 168 hours.  Cardiac Enzymes: No results for input(s): CKTOTAL, CKMB, CKMBINDEX, TROPONINI in the last 168 hours.  HbA1C: Hgb A1c MFr Bld  Date/Time Value Ref Range Status  08/30/2024 04:08 AM 5.7 (H) 4.8 - 5.6 % Final    Comment:    (NOTE) Diagnosis of Diabetes The following HbA1c ranges recommended by the American Diabetes Association (ADA) may be used as an aid in the diagnosis of diabetes mellitus.  Hemoglobin  Suggested A1C NGSP%              Diagnosis  <5.7                   Non Diabetic  5.7-6.4                Pre-Diabetic  >6.4                   Diabetic  <7.0                   Glycemic control for                       adults with diabetes.    03/31/2021 05:38 AM 5.6 4.8 - 5.6 % Final    Comment:    (NOTE) Pre diabetes:          5.7%-6.4%  Diabetes:              >6.4%  Glycemic control for   <7.0% adults with diabetes     CBG: Recent Labs  Lab 08/31/24 1129 08/31/24 1528 08/31/24 1940 08/31/24 2307 09/01/24 0418  GLUCAP 149* 139* 135* 138* 138*    The patient is critically ill due to acute NSTEMI/cardiogenic shock requiring titration of vasopressor  support hyperactive delirium.  Critical care was necessary to treat or prevent imminent or life-threatening deterioration.  Critical care was time spent personally by me on the following activities: development of treatment plan with patient and/or surrogate as well as nursing, discussions with consultants, evaluation of patient's response to treatment, examination of patient, obtaining history from patient or surrogate, ordering and performing treatments and interventions, ordering and review of laboratory studies, ordering and review of radiographic studies, pulse oximetry, re-evaluation of patient's condition and participation in multidisciplinary rounds.   During this encounter critical care time was devoted to patient care services described in this note for 37 minutes.     Valinda Novas, MD [facility] See Amion for pager If no response to pager, please call [phone] until 7pm After 7pm, Please call E-link [phone]

## 2024-09-01 NOTE — Progress Notes (Signed)
 [facility] Progress Note  Subjective:  I/O net neg 2.6 L yesterday Net neg 1.8 L so far today CVP 10-11 today  Back in atrial fib Precedex required for agitation Levo close to off, and vaso down to 0.01 Remains on dobutamine gtt   Vitals:   09/01/24 1230 09/01/24 1244 09/01/24 1245 09/01/24 1300  BP:      Pulse: 61 62 62 63  Resp: 20 16 19 18   Temp:      TempSrc:      SpO2: 100% 100% 100% 100%  Weight:      Height:        Exam: General: alert, no distress, confused, [place] O2 Neck: JVD not elevated. Lungs: Clear bilaterally to auscultation  Heart: RRR with normal S1, S2. No RG.  Abdomen: Soft, non-tender, non-distended  Lower extremities: No edema; R foot in hard boot Neuro: Alert, moves all ext Dialysis Access: L AVF +t/b    OP HD: MWF [facility] 3h  B400  94kg  2K bath  Heparin  none - Mircera 60mcg IV q 2 weeks - Venofer  50mg  IV weekly - Calcitriol  1mcg PO q HD   Assessment/ Plan: Acute/ chronic cor pulmonale w/ cardiogenic+ septic shock: epi gtt off, still on vaso 0.04 and dobutamine. Levo down to 10 from 14 yesterday. Back in NSR.  Acute NSTEMI: s/p DES to LAD Sepsis: possible; getting Zyvox  and meropenem per CCM Shock: getting midodrine  at home dose 15 tid, also DBA gtt, IV abx. Levo/ vaso gtt's may be coming off.  Hypoxic resp failure: w/ pulm edema by CXR. Improving.  ESRD: on HD MWF. Had HD here 10/14 early am. Transitioned to CRRT on 10/15. Cont CRRT.  Volume: sig pulm edema by CXR 10/14, improved on 10/15 and 10/16. 2.6 L off yest, pressors coming down. 1-2 kg under dry wt. Cont UF 100- 150 cc/hr as tolerated per CHF team.  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 Transfuse prn.  Secondary hyperparathyroidism: Ca ok. Cont home meds  CAD  A-fib  Myer Fret MD  [facility] 09/01/2024, 1:15 PM  Recent Labs  Lab 08/31/24 0244 08/31/24 1529 09/01/24 0417   HGB 9.5*  --  8.7*  ALBUMIN   --  2.7* 2.8*  CALCIUM   --  7.5* 7.7*  PHOS  --  1.8* 2.6  CREATININE  --  3.03* 2.51*  K 3.6 3.4* 4.0   No results for input(s): IRON , TIBC, FERRITIN in the last 168 hours. Inpatient medications:  allopurinol  100 mg Oral q AM   amiodarone   200 mg Oral BID   aspirin  EC  81 mg Oral Daily   atorvastatin   80 mg Oral QHS   calcitRIOL   1 mcg Oral Q M,W,F-HD   Chlorhexidine  Gluconate Cloth  6 each Topical Daily   clopidogrel   75 mg Oral Q breakfast   famotidine   10 mg Oral QHS   feeding supplement (NEPRO CARB STEADY)  237 mL Oral BID BM   ferric citrate   420 mg Oral TID WC   [name]   Topical BID   heparin   5,000 Units Subcutaneous Q8H   linezolid   600 mg Oral Q12H   midodrine   15 mg Oral Q8H   oxidized cellulose  1 each Topical Once   pantoprazole   40 mg Oral Daily   QUEtiapine  50 mg Oral BID   sodium chloride  flush  3 mL  Intravenous Q12H   tamsulosin   0.4 mg Oral QPM    sodium chloride  10 mL/hr at 08/30/24 0700   dexmedetomidine (PRECEDEX) IV infusion 0.1 mcg/kg/hr (09/01/24 1300)   DOBUTamine 2.5 mcg/kg/min (09/01/24 1300)   meropenem (MERREM) IV 1 g (09/01/24 1305)   norepinephrine  (LEVOPHED ) Adult infusion Stopped (09/01/24 1123)   prismasol BGK 4/2.5 1,500 mL/hr at 09/01/24 1304   prismasol BGK 4/2.5 400 mL/hr at 09/01/24 0440   prismasol BGK 4/2.5 400 mL/hr at 09/01/24 0440   vasopressin  0.01 Units/min (09/01/24 1300)   sodium chloride , acetaminophen , bisacodyl, heparin , mouth rinse, polyethylene glycol, sodium chloride  flush

## 2024-09-01 NOTE — Plan of Care (Signed)
  Problem: Clinical Measurements: Goal: Ability to maintain clinical measurements within normal limits will improve Outcome: Progressing Goal: Will remain free from infection Outcome: Progressing Goal: Diagnostic test results will improve Outcome: Progressing Goal: Respiratory complications will improve Outcome: Progressing Goal: Cardiovascular complication will be avoided Outcome: Progressing   Problem: Safety: Goal: Ability to remain free from injury will improve Outcome: Progressing   Problem: Cardiovascular: Goal: Ability to achieve and maintain adequate cardiovascular perfusion will improve Outcome: Progressing Goal: Vascular access site(s) Level 0-1 will be maintained Outcome: Progressing

## 2024-09-02 ENCOUNTER — Inpatient Hospital Stay (HOSPITAL_COMMUNITY)

## 2024-09-02 DIAGNOSIS — Z1612 Extended spectrum beta lactamase (ESBL) resistance: Secondary | ICD-10-CM | POA: Diagnosis not present

## 2024-09-02 DIAGNOSIS — I272 Pulmonary hypertension, unspecified: Secondary | ICD-10-CM | POA: Diagnosis not present

## 2024-09-02 DIAGNOSIS — N186 End stage renal disease: Secondary | ICD-10-CM | POA: Diagnosis not present

## 2024-09-02 DIAGNOSIS — I214 Non-ST elevation (NSTEMI) myocardial infarction: Secondary | ICD-10-CM | POA: Diagnosis not present

## 2024-09-02 DIAGNOSIS — I50810 Right heart failure, unspecified: Secondary | ICD-10-CM | POA: Diagnosis not present

## 2024-09-02 DIAGNOSIS — R570 Cardiogenic shock: Secondary | ICD-10-CM | POA: Diagnosis not present

## 2024-09-02 DIAGNOSIS — I2781 Cor pulmonale (chronic): Secondary | ICD-10-CM | POA: Diagnosis not present

## 2024-09-02 LAB — RENAL FUNCTION PANEL
Albumin: 2.6 g/dL — ABNORMAL LOW (ref 3.5–5.0)
Albumin: 2.5 g/dL — ABNORMAL LOW (ref 3.5–5.0)
Anion gap: 13 (ref 5–15)
Anion gap: 10 (ref 5–15)
BUN: 17 mg/dL (ref 6–20)
BUN: 18 mg/dL (ref 6–20)
CO2: 22 mmol/L (ref 22–32)
CO2: 22 mmol/L (ref 22–32)
Calcium: 7.7 mg/dL — ABNORMAL LOW (ref 8.9–10.3)
Calcium: 7.3 mg/dL — ABNORMAL LOW (ref 8.9–10.3)
Chloride: 100 mmol/L (ref 98–111)
Chloride: 101 mmol/L (ref 98–111)
Creatinine, Ser: 2.08 mg/dL — ABNORMAL HIGH (ref 0.61–1.24)
Creatinine, Ser: 2.23 mg/dL — ABNORMAL HIGH (ref 0.61–1.24)
GFR, Estimated: 37 mL/min — ABNORMAL LOW (ref >60)
GFR, Estimated: 34 mL/min — ABNORMAL LOW (ref >60)
Glucose, Bld: 89 mg/dL (ref 70–99)
Glucose, Bld: 132 mg/dL — ABNORMAL HIGH (ref 70–99)
Phosphorus: 1.5 mg/dL — ABNORMAL LOW (ref 2.5–4.6)
Phosphorus: 2.7 mg/dL (ref 2.5–4.6)
Potassium: 3.4 mmol/L — ABNORMAL LOW (ref 3.5–5.1)
Potassium: 3.6 mmol/L (ref 3.5–5.1)
Sodium: 135 mmol/L (ref 135–145)
Sodium: 133 mmol/L — ABNORMAL LOW (ref 135–145)

## 2024-09-02 LAB — CBC
HCT: 28.9 % — ABNORMAL LOW (ref 39.0–52.0)
Hemoglobin: 8.8 g/dL — ABNORMAL LOW (ref 13.0–17.0)
MCH: 29.3 pg (ref 26.0–34.0)
MCHC: 30.4 g/dL (ref 30.0–36.0)
MCV: 96.3 fL (ref 80.0–100.0)
Platelets: 254 K/uL (ref 150–400)
RBC: 3 MIL/uL — ABNORMAL LOW (ref 4.22–5.81)
RDW: 19 % — ABNORMAL HIGH (ref 11.5–15.5)
WBC: 9.6 K/uL (ref 4.0–10.5)
nRBC: 0 % (ref 0.0–0.2)

## 2024-09-02 LAB — GLUCOSE, CAPILLARY
Glucose-Capillary: 96 mg/dL (ref 70–99)
Glucose-Capillary: 99 mg/dL (ref 70–99)
Glucose-Capillary: 82 mg/dL (ref 70–99)
Glucose-Capillary: 277 mg/dL — ABNORMAL HIGH (ref 70–99)
Glucose-Capillary: 127 mg/dL — ABNORMAL HIGH (ref 70–99)

## 2024-09-02 LAB — COOXEMETRY PANEL
Carboxyhemoglobin: 1.3 % (ref 0.5–1.5)
Methemoglobin: <0.7 % (ref 0.0–1.5)
O2 Saturation: 54.4 %
Total hemoglobin: 9.3 g/dL — ABNORMAL LOW (ref 12.0–16.0)

## 2024-09-02 LAB — MAGNESIUM: Magnesium: 2.6 mg/dL — ABNORMAL HIGH (ref 1.7–2.4)

## 2024-09-02 MED ORDER — MIDODRINE HCL 5 MG PO TABS
20.0000 mg | ORAL_TABLET | Freq: Three times a day (TID) | ORAL | Status: DC
  Administered 2024-09-02 – 2024-09-13 (×33): 20 mg via ORAL
  Filled 2024-09-02 (×33): qty 4

## 2024-09-02 MED ORDER — MIDODRINE HCL 5 MG PO TABS
5.0000 mg | ORAL_TABLET | Freq: Once | ORAL | Status: AC
  Administered 2024-09-02: 5 mg via ORAL
  Filled 2024-09-02: qty 1

## 2024-09-02 MED ORDER — POTASSIUM PHOSPHATES 15 MMOLE/5ML IV SOLN
30.0000 mmol | Freq: Once | INTRAVENOUS | Status: AC
  Administered 2024-09-02: 30 mmol via INTRAVENOUS
  Filled 2024-09-02: qty 10

## 2024-09-02 NOTE — Plan of Care (Signed)

## 2024-09-02 NOTE — Progress Notes (Addendum)
 [name], male   DOB: 1966-04-14, 58 y.o.   MRN: [id number]     Advanced Heart Failure Rounding Note  Cardiologist: None  Chief Complaint: Cardiogenic shock with RV failure post NSTEMI  Subjective:    Continues on DBA 2.5 + NE 6 + VP 0.03. Early am co-ox 54%.   On CVVH, currently pulling 100 cc/hr. CVP 10-11, I/Os net negative 1879 and down 2 lbs.    He is in atrial fibrillation rate 60s, on po amiodarone .    Off Precedex, awake and no further hallucinations.   Objective:   Weight Range: 91.2 kg Body mass index is 26.53 kg/m.   Vital Signs:   Temp:  [97.4 F (36.3 C)-97.7 F (36.5 C)] 97.4 F (36.3 C) (10/19 0730) Pulse Rate:  [58-108] 104 (10/19 0845) Resp:  [15-26] 21 (10/19 0845) BP: (112)/(53) 112/53 (10/18 1208) SpO2:  [92 %-100 %] 100 % (10/19 0845) Arterial Line BP: (82-119)/(42-61) 82/47 (10/19 0845) Weight:  [91.2 kg] 91.2 kg (10/19 0500) Last BM Date : 09/01/24  Weight change: Filed Weights   08/31/24 0500 09/01/24 0500 09/02/24 0500  Weight: 93.7 kg 92.4 kg 91.2 kg   Intake/Output:   Intake/Output Summary (Last 24 hours) at 09/02/2024 0904 Last data filed at 09/02/2024 0845 Gross per 24 hour  Intake 1520.98 ml  Output 2911 ml  Net -1390.02 ml    Physical Exam   General: NAD Neck: JVP 10 cm, no thyromegaly or thyroid nodule.  Lungs: Clear to auscultation bilaterally with normal respiratory effort. CV: Nondisplaced PMI.  Heart irregular S1/S2, no S3/S4, no murmur.  No peripheral edema.   Abdomen: Soft, nontender, no hepatosplenomegaly, no distention.  Skin: Intact without lesions or rashes.  Neurologic: Alert and oriented x 3.  Psych: Normal affect. Extremities: No clubbing or cyanosis.  HEENT: Normal.   Telemetry   AF in 60s (Personally reviewed)   Labs    CBC Recent Labs    09/01/24 0417 09/02/24 0458  WBC 14.2* 9.6  HGB 8.7* 8.8*  HCT 28.2* 28.9*  MCV 94.6 96.3  PLT 281 254   Basic Metabolic Panel Recent Labs     09/01/24 0417 09/01/24 1552 09/02/24 0458  NA 136 135 135  K 4.0 3.6 3.4*  CL 102 103 100  CO2 23 23 22   GLUCOSE 126* 108* 89  BUN 17 16 17   CREATININE 2.51* 2.37* 2.08*  CALCIUM  7.7* 7.5* 7.7*  MG 2.6*  --  2.6*  PHOS 2.6 2.1* 1.5*   Liver Function Tests Recent Labs    09/01/24 1552 09/02/24 0458  ALBUMIN  2.6* 2.6*   No results for input(s): LIPASE, AMYLASE in the last 72 hours.  Cardiac Enzymes No results for input(s): CKTOTAL, CKMB, CKMBINDEX, TROPONINI in the last 72 hours.  BNP: BNP (last 3 results) Recent Labs    08/27/24 0956  BNP 569.7*    ProBNP (last 3 results) No results for input(s): PROBNP in the last 8760 hours.   D-Dimer No results for input(s): DDIMER in the last 72 hours. Hemoglobin A1C No results for input(s): HGBA1C in the last 72 hours.  Fasting Lipid Panel No results for input(s): CHOL, HDL, LDLCALC, TRIG, CHOLHDL, LDLDIRECT in the last 72 hours.  Thyroid Function Tests No results for input(s): TSH, T4TOTAL, T3FREE, THYROIDAB in the last 72 hours.  Invalid input(s): FREET3  Other results:   Imaging    No results found.    Medications:     Scheduled Medications:  allopurinol  100  mg Oral q AM   amiodarone   200 mg Oral BID   aspirin  EC  81 mg Oral Daily   atorvastatin   80 mg Oral QHS   calcitRIOL   1 mcg Oral Q M,W,F-HD   Chlorhexidine  Gluconate Cloth  6 each Topical Daily   clopidogrel   75 mg Oral Q breakfast   famotidine   10 mg Oral QHS   feeding supplement (NEPRO CARB STEADY)  237 mL Oral BID BM   ferric citrate   420 mg Oral TID WC   [name]   Topical BID   heparin   5,000 Units Subcutaneous Q8H   linezolid   600 mg Oral Q12H   midodrine   20 mg Oral Q8H   midodrine   5 mg Oral Once   pantoprazole   40 mg Oral Daily   QUEtiapine  50 mg Oral BID   sodium chloride  flush  3 mL Intravenous Q12H   tamsulosin   0.4 mg Oral QPM    Infusions:  sodium chloride  10  mL/hr at 08/30/24 0700   dexmedetomidine (PRECEDEX) IV infusion Stopped (09/02/24 0836)   DOBUTamine 2.5 mcg/kg/min (09/02/24 0800)   meropenem (MERREM) IV Stopped (09/02/24 0539)   norepinephrine  (LEVOPHED ) Adult infusion 2 mcg/min (09/02/24 0800)   potassium PHOSPHATE IVPB (in mmol)     prismasol BGK 4/2.5 1,500 mL/hr at 09/02/24 0626   prismasol BGK 4/2.5 400 mL/hr at 09/02/24 0626   prismasol BGK 4/2.5 400 mL/hr at 09/02/24 9373   vasopressin  0.03 Units/min (09/02/24 0800)    PRN Medications: sodium chloride , acetaminophen , bisacodyl, heparin , mouth rinse, polyethylene glycol, sodium chloride  flush    Patient Profile   [name] is a 58 y.o. AAM with chronic HFpEF, PAF s/p watchman, pSVT, ESRD on iHD, chronic hypotension on midodrine , hx small AVMs, CAD s/p DES to LAD and LCx, hx DVT and OSA on CPAP. AHF team to see for cardiogenic shock with RV failure, s/p NSTEMI, chronic HFpEF.   Assessment/Plan   1. Shock: Suspect mixed cardiogenic and septic shock with low SVR initially.  DBA added for low co-ox of 49%.  Today, remains on NE at 6 + VP 0.03 + DBA 2.5. Co-ox 54% (early am). Pressors have been up and down with fluctuating BP.   - Continue dobutamine 2.5  - Can wean NE and VP for SBP > 90.  - Increase midodrine  to 20 mg tid.  - Broad spectrum abx.  2. RV failure: Echo showed EF >75% with severe RV dilation and severe RV dysfunction.  RHC 10/13 showed severe pulmonary arterial hypertension with CVP 19 and PCWP 9, significant RV failure.  The RV dysfunction is not explained by coronary disease (no obstructive RCA disease).  He has a history of pulmonary hypertension (see #3 below).  I suspect RV dysfunction is from long-standing pulmonary hypertension.  He was on sildenafil in the past but not on it now. Unable to tolerate currently w/ hypotension. CVP 10-11 today.  - continue DBA for RV support.  - CVVH for volume removal, aim for UF net negative 100 cc/hr again today.  -  Pulmonary HTN management as below.  3. Pulmonary hypertension: Pulmonary arterial hypertension based on RHC 10/14 with PA 72/34, PVR 6.21.  He says that he was followed by a PH specialist in the past at [facility] and did 6 minute walks regularly.  He says he was told that he had PH because he was on dialysis.  He remembers taking sildenafil at some point.  CTA chest this admission did not  show PE, there was scarring and pleural plaquing but no definite ILD or sarcoidosis.  - When BP is more stable, would be reasonable to start him on sildenafil 20 tid.  - Would get V/Q scan to look for chronic PEs when he is more stable.  4. ESRD: Patient failed prior renal transplant, was being worked up for another transplant. Significant right-sided volume overload with CVP 10-11  - Continue CVVH, aim for UF net negative 100 cc/hr today.  5. Atrial arrhythmias: Patient has history of paroxysmal atrial fibrillation and has a Watchman in place. He has been in rate-controlled AF.  - Continue Amiodarone  200 mg bid for now. Stop if he re-develops bradycardia.  - No systemic anticoagulation with Watchman.  6. Bradycardia: Patient had transient bradycardia in setting of hypotension and mental status changes on 10/13.  Follow telemetry closely for recurrence.  7. CAD: NSTEMI with TnI > 24000 on 10/13.  He had no chest pain.  LHC showed 99% in-stent restenosis in the mid LAD treated with DES.  He had severe OM disease as well, managed medically.  No significant RCA disease.  I cannot explain his failed RV by CAD. Limited echo 10/15 EF 55-60%, RV not well visualized  - Continue ASA 81 and Plavix  75 - Continue atorvastatin  80.  8. ID: Recent ESBL E coli bacteremia, recent TMA right foot for gangrene. He has been hypotensive with low SVR, suspect component of septic/distributive shock. PCT was high even in setting of ESRD (46).  - Continue linezolid  + Meropenem.  9. Delirium: With visual hallucinations.   ABG did not show  hypercarbia, NH3 normal.  ?Related to ongoing infection with delirium.  Started on Precedex, now off.  No further hallucinations this morning and seems clear.   - Per CCM.    CRITICAL CARE Performed by: Ezra Shuck  Total critical care time: 40 minutes  Critical care time was exclusive of separately billable procedures and treating other patients.  Critical care was necessary to treat or prevent imminent or life-threatening deterioration.  Critical care was time spent personally by me on the following activities: development of treatment plan with patient and/or surrogate as well as nursing, discussions with consultants, evaluation of patient's response to treatment, examination of patient, obtaining history from patient or surrogate, ordering and performing treatments and interventions, ordering and review of laboratory studies, ordering and review of radiographic studies, pulse oximetry and re-evaluation of patient's condition.  Ezra Shuck 09/02/2024 9:04 AM

## 2024-09-02 NOTE — Progress Notes (Signed)
 [facility] Progress Note  Subjective:  I/O net neg 2.3 L yesterday Wts down 91kg (dry wt was 94kg) Levo gtt going back up a bit  D/w CHF, hoping for another 1 day or so on CRRT    Vitals:   09/02/24 1030 09/02/24 1045 09/02/24 1057 09/02/24 1100  BP:      Pulse:  98 (!) 103 99  Resp: (!) 23 (!) 26 (!) 25 (!) 25  Temp:      TempSrc:      SpO2:  100% 100% 100%  Weight:      Height:        Exam: General: alert, no distress, confused, Lithium O2 Neck: JVD not elevated. Lungs: Clear bilaterally to auscultation  Heart: RRR with normal S1, S2. No RG.  Abdomen: Soft, non-tender, non-distended  Lower extremities: No edema; R foot in hard boot Neuro: Alert, moves all ext Dialysis Access: L AVF +t/b    OP HD: MWF [facility] 3h  B400  94kg  2K bath  Heparin  none - Mircera 60mcg IV q 2 weeks - Venofer  50mg  IV weekly - Calcitriol  1mcg PO q HD   Assessment/ Plan: Acute/ chronic cor pulmonale w/ cardiogenic+ septic shock: epi gtt off since 10/15. Vaso at 0.03 and levo at 6 micrograms/min. DBA stable. On midodrine  20 tid.  Acute NSTEMI: s/p DES to LAD Sepsis: possible; getting Zyvox  and meropenem per CCM ESRD: on HD MWF. Had HD here 10/14 early am. Transitioned to CRRT on 10/15. Cont CRRT.  Volume: sig pulm edema by CXR 10/14, improved on 10/15 and 10/16. 2.2 L net neg yesterday and is now 3 kg down from dry wt. Lowered UF to 75- 100 cc/hr per CHF team.  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 Transfuse prn.  Secondary hyperparathyroidism: Ca ok. Cont home meds  CAD  A-fib  Myer Fret MD  [facility] 09/02/2024, 11:11 AM  Recent Labs  Lab 09/01/24 0417 09/01/24 1552 09/02/24 0458  HGB 8.7*  --  8.8*  ALBUMIN  2.8* 2.6* 2.6*  CALCIUM  7.7* 7.5* 7.7*  PHOS 2.6 2.1* 1.5*  CREATININE 2.51* 2.37* 2.08*  K 4.0 3.6 3.4*   No results for input(s): IRON , TIBC, FERRITIN in the  last 168 hours. Inpatient medications:  allopurinol  100 mg Oral q AM   amiodarone   200 mg Oral BID   aspirin  EC  81 mg Oral Daily   atorvastatin   80 mg Oral QHS   calcitRIOL   1 mcg Oral Q M,W,F-HD   Chlorhexidine  Gluconate Cloth  6 each Topical Daily   clopidogrel   75 mg Oral Q breakfast   famotidine   10 mg Oral QHS   feeding supplement (NEPRO CARB STEADY)  237 mL Oral BID BM   ferric citrate   420 mg Oral TID WC   [name]   Topical BID   heparin   5,000 Units Subcutaneous Q8H   linezolid   600 mg Oral Q12H   midodrine   20 mg Oral Q8H   pantoprazole   40 mg Oral Daily   QUEtiapine  50 mg Oral BID   sodium chloride  flush  3 mL Intravenous Q12H   tamsulosin   0.4 mg Oral QPM    sodium chloride  10 mL/hr at 08/30/24 0700   dexmedetomidine (PRECEDEX) IV infusion Stopped (09/02/24 0836)   DOBUTamine 2.5 mcg/kg/min (09/02/24 1100)   meropenem (MERREM) IV Stopped (09/02/24 0539)   norepinephrine  (  LEVOPHED ) Adult infusion 6 mcg/min (09/02/24 1100)   potassium PHOSPHATE IVPB (in mmol) 85 mL/hr at 09/02/24 1100   prismasol BGK 4/2.5 1,500 mL/hr at 09/02/24 0626   prismasol BGK 4/2.5 400 mL/hr at 09/02/24 0626   prismasol BGK 4/2.5 400 mL/hr at 09/02/24 0626   vasopressin  0.03 Units/min (09/02/24 1100)   sodium chloride , acetaminophen , bisacodyl, heparin , mouth rinse, polyethylene glycol, sodium chloride  flush

## 2024-09-02 NOTE — Progress Notes (Signed)
 NAME:  [name], MRN:  [id number], DOB:  [birth year], LOS: 5 ADMISSION DATE:  08/25/2024, CONSULTATION DATE:  08/27/24 REFERRING MD:  Dr. Camellia Door, CHIEF COMPLAINT:  bradycardia, hypotension   History of Present Illness:  [name] is a 58 y/o M with a PMH significant for ESRD on iHD (MWF), chronic hypotension on Midodrine , hx of small AVMs, paroxysmal a. Fib s/p watchman not on [facility] who presented to the orthopedic clinic for R foot gangrene with a recent hx of ESBL Bacteremia and sepsis presumed to be due to osteomyelitis now s/p R transmetatarsal amputation on 08/25/2024. This AM he developed some confusion, bradycardia, and hypotension requiring atropine administration and vasopressors. The patient was moved to ICU for hemodynamic monitoring and further care   Pertinent  Medical History  As above  Significant Hospital Events: Including procedures, antibiotic start and stop dates in addition to other pertinent events   08/25/2024 admission to the hospital for R transmetatarsal amputation 08/27/2024 admitted to ICU for hypotension and bradycardia, CTA PE negative. TTE with severely enlarged R ventricle, new. NSTEMI with troponin >24K; cardiology started on heparin  gtt and ASA 10/14 underwent cardiac cath and LAD stenting, remain on epinephrine  and norepinephrine  10/15 patient is requiring multiple vasopressor support, started on CRRT.  Serum troponin remain elevated >24k 10/16 remain on vasopressor support with norepinephrine  and vasopressin .  Epinephrine  was titrated off.  Remain on CRRT with net -300 cc. COOX 46%.  Went into A-fib with RVR requiring amiodarone  infusion 10/17 started getting confused and hallucinating, vasopressor requirement is improving, remain on CRRT with net negative fluid balance 10/18 patient was started on Precedex in the setting of agitation and restlessness.  Remained on CRRT with net -2.5 L.  Remain on dobutamine with Coox 50%   Interim History / Subjective:   No overnight issues Remain on low-dose vasopressor support with Levophed  1 mic per minute and vasopressin  0.04 units/min On CRRT with net -1.8 L in last 24 hours, weight is down by 2.5 pounds  Objective   Blood pressure (!) 112/53, pulse 67, temperature (!) 97.4 F (36.3 C), temperature source Axillary, resp. rate 18, height 6' 0.99 (1.854 m), weight 91.2 kg, SpO2 100%. CVP:  [6 mmHg-22 mmHg] 6 mmHg      Intake/Output Summary (Last 24 hours) at 09/02/2024 0742 Last data filed at 09/02/2024 0700 Gross per 24 hour  Intake 1319.42 ml  Output 3199 ml  Net -1879.58 ml   Filed Weights   08/31/24 0500 09/01/24 0500 09/02/24 0500  Weight: 93.7 kg 92.4 kg 91.2 kg    Examination: General: Chronically ill-appearing male, lying on the bed HEENT: [place]/AT, eyes anicteric.  Dry mucus membranes Neuro: Sleepy, opens eyes with vocal stimuli, following simple commands, intermittently gets confused but not restless Chest: Diminished air entry on left side, no wheezes or rhonchi Heart: Regular rate and rhythm, no murmurs or gallops Abdomen: Soft, nontender, nondistended, bowel sounds present  Labs reviewed  Patient Lines/Drains/Airways Status     Active Line/Drains/Airways     Name Placement date Placement time Site Days   Arterial Line 08/27/24 Right Femoral 08/27/24  1200  Femoral  6   CVC Triple Lumen 08/27/24 Left Femoral 08/27/24  1500  -- 6   Fistula / Graft Left Forearm Arteriovenous fistula --  --  Forearm  --   Hemodialysis Catheter Right Internal jugular Triple lumen Temporary (Non-Tunneled) 08/29/24  1000  Internal jugular  4   Midline Single Lumen 08/17/24 Right Cephalic 8 cm 0 cm  08/17/24  1541  Cephalic  16   Negative Pressure Wound Therapy Leg Left;Lower 11/30/22  1518  --  642   Negative Pressure Wound Therapy Leg Left;Lower;Anterior 03/22/23  1427  --  530   Wound 08/12/24 2300 Atypical Toe (Comment  which one) Anterior;Right 08/12/24  2300  Toe (Comment  which one)  21    Wound 08/25/24 1330 Other (Comment) Toe (Comment  which one) Anterior;Left 08/25/24  1330  Toe (Comment  which one)  8   Wound 08/29/24 0800 Pressure Injury Buttocks Right Deep Tissue Pressure Injury - Purple or maroon localized area of discolored intact skin or blood-filled blister due to damage of underlying soft tissue from pressure and/or shear. 08/29/24  0800  Buttocks  4   Wound 08/29/24 0800 Pressure Injury Buttocks Left Deep Tissue Pressure Injury - Purple or maroon localized area of discolored intact skin or blood-filled blister due to damage of underlying soft tissue from pressure and/or shear. 08/29/24  0800  Buttocks  4         Resolved Hospital Problem list   Hypoglycemia Symptomatic bradycardia, resolved Lactic acidosis QTc prolongation, resolved Hypervolemic hyponatremia  Assessment & Plan:  Acute on chronic cor pulmonale with cardiogenic shock Acute NSTEMI status post DES to LAD Severe pulmonary hypertension Mixed cardiogenic and septic shock, improving Paroxysmal A-fib status post Watchman device, currently in sinus rhythm Recent history of ESBL E. coli bacteremia and right forefoot osteomyelitis status post amputation End-stage renal disease on hemodialysi on CRRT Hypokalemia/hypophosphatemia Deep tissue injury on bilateral buttock, unable to determine if it was present on admission Anemia of renal disease Acute hyperactive delirium  Appreciate advanced heart failure team follow-up Continue dobutamine at 2.5 mics per KG per minute, Coox 54% this morning Remain on low-dose vasopressor support with Levophed  at 1 mic per minute and vasopressin  0.04 unit/min Continue CRRT for volume removal with net negative goal of 1 to 2 L Continue aspirin , Plavix  and statin Continue p.o. amiodarone  Patient is in sinus rhythm Continue broad-spectrum antibiotic to complete 5 days therapy, cultures have been negative Procalcitonin trended down Hemoglobin stable between 8-9 Patient  is less agitated, on 0.4 mcg/kg/h of Precedex, continue to titrate with RASS goal 0 Continue Seroquel twice daily Continue wound care   Labs   CBC: Recent Labs  Lab 08/27/24 0956 08/27/24 1303 08/29/24 0344 08/30/24 0810 08/31/24 0236 08/31/24 0244 09/01/24 0417 09/02/24 0458  WBC 15.8*   < > 17.1* 13.7* 13.8*  --  14.2* 9.6  NEUTROABS 12.7*  --  14.0*  --   --   --   --   --   HGB 9.0*   < > 8.9* 8.9* 8.2* 9.5* 8.7* 8.8*  HCT 31.4*   < > 27.7* 28.2* 26.3* 28.0* 28.2* 28.9*  MCV 102.6*   < > 91.4 92.2 92.6  --  94.6 96.3  PLT 326   < > 345 342 323  --  281 254   < > = values in this interval not displayed.    Basic Metabolic Panel: Recent Labs  Lab 08/28/24 0232 08/28/24 1101 08/30/24 0408 08/30/24 1557 08/31/24 0236 08/31/24 0244 08/31/24 1529 09/01/24 0417 09/01/24 1552 09/02/24 0458  NA 132*   < > 129*   < > 133* 135 135 136 135 135  K 3.3*   < > 4.6   < > 3.5 3.6 3.4* 4.0 3.6 3.4*  CL 91*   < > 94*   < > 98  --  100  102 103 100  CO2 22   < > 19*   < > 21*  --  22 23 23 22   GLUCOSE 145*   < > 134*   < > 144*  --  143* 126* 108* 89  BUN 28*   < > 30*   < > 24*  --  21* 17 16 17   CREATININE 7.15*   < > 5.75*   < > 3.76*  --  3.03* 2.51* 2.37* 2.08*  CALCIUM  7.7*   < > 7.1*   < > 7.5*  --  7.5* 7.7* 7.5* 7.7*  MG 2.4  --  2.3  --  2.5*  --   --  2.6*  --  2.6*  PHOS 4.2   < > 3.5   < > 2.1*  --  1.8* 2.6 2.1* 1.5*   < > = values in this interval not displayed.   GFR: Estimated Creatinine Clearance: 44.8 mL/min (A) (by C-G formula based on SCr of 2.08 mg/dL (H)). Recent Labs  Lab 08/28/24 1101 08/28/24 1447 08/28/24 2107 08/29/24 0913 08/29/24 1336 08/30/24 0810 08/31/24 0236 08/31/24 0916 09/01/24 0417 09/02/24 0458  PROCALCITON 45.77  --   --   --   --   --   --  22.24  --   --   WBC  --   --    < >  --   --  13.7* 13.8*  --  14.2* 9.6  LATICACIDVEN 3.1* 3.3*  --  1.3 1.6  --   --   --   --   --    < > = values in this interval not displayed.     Liver Function Tests: Recent Labs  Lab 08/27/24 1814 08/28/24 1447 08/29/24 0344 08/29/24 1555 08/31/24 0236 08/31/24 1529 09/01/24 0417 09/01/24 1552 09/02/24 0458  AST 287*  --  1,820*  --   --   --   --   --   --   ALT 30  --  90*  --   --   --   --   --   --   ALKPHOS 174*  --  208*  --   --   --   --   --   --   BILITOT 0.8  --  0.9  --   --   --   --   --   --   PROT 6.4*  --  6.5  --   --   --   --   --   --   ALBUMIN  2.6*   < > 2.5*   < > 2.7* 2.7* 2.8* 2.6* 2.6*   < > = values in this interval not displayed.   Recent Labs  Lab 08/28/24 1101  LIPASE 28   Recent Labs  Lab 08/31/24 0236  AMMONIA 23    ABG    Component Value Date/Time   PHART 7.439 08/31/2024 0244   PCO2ART 35.1 08/31/2024 0244   PO2ART 73 (L) 08/31/2024 0244   HCO3 23.8 08/31/2024 0244   TCO2 25 08/31/2024 0244   ACIDBASEDEF 14.0 (H) 08/27/2024 1303   O2SAT 54.4 09/02/2024 0458     Coagulation Profile: No results for input(s): INR, PROTIME in the last 168 hours.  Cardiac Enzymes: No results for input(s): CKTOTAL, CKMB, CKMBINDEX, TROPONINI in the last 168 hours.  HbA1C: Hgb A1c MFr Bld  Date/Time Value Ref Range Status  08/30/2024 04:08 AM 5.7 (H) 4.8 - 5.6 %  Final    Comment:    (NOTE) Diagnosis of Diabetes The following HbA1c ranges recommended by the American Diabetes Association (ADA) may be used as an aid in the diagnosis of diabetes mellitus.  Hemoglobin             Suggested A1C NGSP%              Diagnosis  <5.7                   Non Diabetic  5.7-6.4                Pre-Diabetic  >6.4                   Diabetic  <7.0                   Glycemic control for                       adults with diabetes.    03/31/2021 05:38 AM 5.6 4.8 - 5.6 % Final    Comment:    (NOTE) Pre diabetes:          5.7%-6.4%  Diabetes:              >6.4%  Glycemic control for   <7.0% adults with diabetes     CBG: Recent Labs  Lab 09/01/24 1555 09/01/24 1933  09/02/24 0013 09/02/24 0456 09/02/24 0736  GLUCAP 101* 100* 99 96 82    The patient is critically ill due to acute NSTEMI/cardiogenic shock requiring titration of vasopressor support hyperactive delirium.  Critical care was necessary to treat or prevent imminent or life-threatening deterioration.  Critical care was time spent personally by me on the following activities: development of treatment plan with patient and/or surrogate as well as nursing, discussions with consultants, evaluation of patient's response to treatment, examination of patient, obtaining history from patient or surrogate, ordering and performing treatments and interventions, ordering and review of laboratory studies, ordering and review of radiographic studies, pulse oximetry, re-evaluation of patient's condition and participation in multidisciplinary rounds.   During this encounter critical care time was devoted to patient care services described in this note for 35 minutes.     Valinda Novas, MD [facility] See Amion for pager If no response to pager, please call [phone] until 7pm After 7pm, Please call E-link [phone]

## 2024-09-03 ENCOUNTER — Other Ambulatory Visit (HOSPITAL_COMMUNITY): Payer: Self-pay

## 2024-09-03 DIAGNOSIS — I2781 Cor pulmonale (chronic): Secondary | ICD-10-CM | POA: Diagnosis not present

## 2024-09-03 DIAGNOSIS — I214 Non-ST elevation (NSTEMI) myocardial infarction: Secondary | ICD-10-CM | POA: Diagnosis not present

## 2024-09-03 DIAGNOSIS — Z1612 Extended spectrum beta lactamase (ESBL) resistance: Secondary | ICD-10-CM | POA: Diagnosis not present

## 2024-09-03 DIAGNOSIS — R570 Cardiogenic shock: Secondary | ICD-10-CM | POA: Diagnosis not present

## 2024-09-03 LAB — RENAL FUNCTION PANEL
Albumin: 2.5 g/dL — ABNORMAL LOW (ref 3.5–5.0)
Albumin: 2.6 g/dL — ABNORMAL LOW (ref 3.5–5.0)
Anion gap: 11 (ref 5–15)
Anion gap: 10 (ref 5–15)
BUN: 18 mg/dL (ref 6–20)
BUN: 17 mg/dL (ref 6–20)
CO2: 23 mmol/L (ref 22–32)
CO2: 25 mmol/L (ref 22–32)
Calcium: 7.4 mg/dL — ABNORMAL LOW (ref 8.9–10.3)
Calcium: 7.5 mg/dL — ABNORMAL LOW (ref 8.9–10.3)
Chloride: 100 mmol/L (ref 98–111)
Chloride: 99 mmol/L (ref 98–111)
Creatinine, Ser: 2.15 mg/dL — ABNORMAL HIGH (ref 0.61–1.24)
Creatinine, Ser: 2.07 mg/dL — ABNORMAL HIGH (ref 0.61–1.24)
GFR, Estimated: 35 mL/min — ABNORMAL LOW (ref >60)
GFR, Estimated: 37 mL/min — ABNORMAL LOW (ref >60)
Glucose, Bld: 115 mg/dL — ABNORMAL HIGH (ref 70–99)
Glucose, Bld: 142 mg/dL — ABNORMAL HIGH (ref 70–99)
Phosphorus: 1.9 mg/dL — ABNORMAL LOW (ref 2.5–4.6)
Phosphorus: 2.8 mg/dL (ref 2.5–4.6)
Potassium: 3.5 mmol/L (ref 3.5–5.1)
Potassium: 4 mmol/L (ref 3.5–5.1)
Sodium: 134 mmol/L — ABNORMAL LOW (ref 135–145)
Sodium: 134 mmol/L — ABNORMAL LOW (ref 135–145)

## 2024-09-03 LAB — C-REACTIVE PROTEIN: CRP: 7.8 mg/dL — ABNORMAL HIGH (ref <1.0)

## 2024-09-03 LAB — CBC
HCT: 28.5 % — ABNORMAL LOW (ref 39.0–52.0)
Hemoglobin: 8.7 g/dL — ABNORMAL LOW (ref 13.0–17.0)
MCH: 29.5 pg (ref 26.0–34.0)
MCHC: 30.5 g/dL (ref 30.0–36.0)
MCV: 96.6 fL (ref 80.0–100.0)
Platelets: 263 K/uL (ref 150–400)
RBC: 2.95 MIL/uL — ABNORMAL LOW (ref 4.22–5.81)
RDW: 19.9 % — ABNORMAL HIGH (ref 11.5–15.5)
WBC: 10 K/uL (ref 4.0–10.5)
nRBC: 0.2 % (ref 0.0–0.2)

## 2024-09-03 LAB — COOXEMETRY PANEL
Carboxyhemoglobin: 2.6 % — ABNORMAL HIGH (ref 0.5–1.5)
Methemoglobin: <0.7 % (ref 0.0–1.5)
O2 Saturation: 71.4 %
Total hemoglobin: 9.1 g/dL — ABNORMAL LOW (ref 12.0–16.0)

## 2024-09-03 LAB — GLUCOSE, CAPILLARY
Glucose-Capillary: 100 mg/dL — ABNORMAL HIGH (ref 70–99)
Glucose-Capillary: 107 mg/dL — ABNORMAL HIGH (ref 70–99)
Glucose-Capillary: 117 mg/dL — ABNORMAL HIGH (ref 70–99)
Glucose-Capillary: 117 mg/dL — ABNORMAL HIGH (ref 70–99)
Glucose-Capillary: 118 mg/dL — ABNORMAL HIGH (ref 70–99)
Glucose-Capillary: 126 mg/dL — ABNORMAL HIGH (ref 70–99)
Glucose-Capillary: 151 mg/dL — ABNORMAL HIGH (ref 70–99)

## 2024-09-03 LAB — FOLATE: Folate: 4.3 ng/mL — ABNORMAL LOW (ref >5.9)

## 2024-09-03 LAB — MAGNESIUM: Magnesium: 2.3 mg/dL (ref 1.7–2.4)

## 2024-09-03 MED ORDER — POTASSIUM PHOSPHATES 15 MMOLE/5ML IV SOLN
30.0000 mmol | Freq: Once | INTRAVENOUS | Status: AC
  Administered 2024-09-03: 30 mmol via INTRAVENOUS
  Filled 2024-09-03: qty 10

## 2024-09-03 MED ORDER — RENA-VITE PO TABS
1.0000 | ORAL_TABLET | Freq: Two times a day (BID) | ORAL | Status: DC
  Administered 2024-09-03 – 2024-09-10 (×14): 1 via ORAL
  Filled 2024-09-03 (×14): qty 1

## 2024-09-03 MED ORDER — PROSOURCE PLUS PO LIQD
30.0000 mL | Freq: Two times a day (BID) | ORAL | Status: DC
  Administered 2024-09-03 – 2024-09-13 (×18): 30 mL via ORAL
  Filled 2024-09-03 (×18): qty 30

## 2024-09-03 MED ORDER — TRAMADOL HCL 50 MG PO TABS
25.0000 mg | ORAL_TABLET | Freq: Four times a day (QID) | ORAL | Status: AC | PRN
  Administered 2024-09-03 – 2024-09-04 (×2): 25 mg via ORAL
  Filled 2024-09-03 (×2): qty 1

## 2024-09-03 MED ORDER — TRAMADOL HCL 50 MG PO TABS
25.0000 mg | ORAL_TABLET | Freq: Once | ORAL | Status: AC
  Administered 2024-09-03: 25 mg via ORAL
  Filled 2024-09-03: qty 1

## 2024-09-03 NOTE — Plan of Care (Signed)

## 2024-09-03 NOTE — TOC Transition Note (Signed)
 Transition of Care [facility]) - Discharge Note   Patient Details  Name: [name] MRN: [id number] Date of Birth: 1966/07/14  [facility] of [facility]) CM/SW Contact:  Justina Delcia Czar, RN Phone Number:[phone] 09/03/2024, 5:42 PM   Clinical Narrative:     Spoke to pt and states he needs his STD completed for his job. Wife will send paperwork to complete.     Barriers to Discharge: Continued Medical Work up   Patient Goals and CMS Choice     Discharge Placement     Discharge Plan and Services Additional resources added to the After Visit Summary for     Discharge Planning Services: CM Consult   Social Drivers of Health (SDOH) Interventions SDOH Screenings   Food Insecurity: No Food Insecurity (08/25/2024)  Housing: Low Risk  (08/25/2024)  Transportation Needs: No Transportation Needs (08/25/2024)  Utilities: Not At Risk (08/25/2024)  Depression (PHQ2-9): Low Risk  (08/25/2023)  Tobacco Use: Low Risk  (08/25/2024)     Readmission Risk Interventions    08/17/2024    3:30 PM 08/17/2024   10:25 AM 01/25/2023   11:36 AM  Readmission Risk Prevention Plan  Transportation Screening Complete Complete Complete  HRI or Home Care Consult Complete Complete   Social Work Consult for Recovery Care Planning/Counseling Complete Complete   Palliative Care Screening Not Applicable Not Applicable   Medication Review Oceanographer) Complete Complete Complete  PCP or Specialist appointment within 3-5 days of discharge   Complete  HRI or Home Care Consult   Complete  SW Recovery Care/Counseling Consult   Complete  Palliative Care Screening   Not Applicable  Skilled Nursing Facility   Not Applicable

## 2024-09-03 NOTE — Plan of Care (Signed)
  Problem: Education: Goal: Knowledge of General Education information will improve Description: Including pain rating scale, medication(s)/side effects and non-pharmacologic comfort measures Outcome: Progressing   Problem: Health Behavior/Discharge Planning: Goal: Ability to manage health-related needs will improve Outcome: Progressing   Problem: Clinical Measurements: Goal: Ability to maintain clinical measurements within normal limits will improve Outcome: Progressing Goal: Will remain free from infection Outcome: Progressing Goal: Diagnostic test results will improve Outcome: Progressing Goal: Respiratory complications will improve Outcome: Progressing Goal: Cardiovascular complication will be avoided Outcome: Progressing   Problem: Nutrition: Goal: Adequate nutrition will be maintained Outcome: Progressing   Problem: Coping: Goal: Level of anxiety will decrease Outcome: Progressing   Problem: Elimination: Goal: Will not experience complications related to bowel motility Outcome: Progressing   Problem: Skin Integrity: Goal: Risk for impaired skin integrity will decrease Outcome: Progressing   Problem: Safety: Goal: Ability to remain free from injury will improve Outcome: Progressing   Problem: Pain Managment: Goal: General experience of comfort will improve and/or be controlled Outcome: Progressing

## 2024-09-03 NOTE — Progress Notes (Signed)
 Nutrition Follow-up  DOCUMENTATION CODES:   Severe malnutrition in context of chronic illness  INTERVENTION:   Liberalize diet to Regular with FR while on CRRT, until po intake consistently adequate; allow double portion of entree (protein) at meals  Continue to supplement phosphorus while on CRRT; <2.0 this AM  Add bedtime snack  Add Renal MVI BID  Recommend checking Vitamin C, Zinc , Folate with CRP; further supplementation pending results.   Add 30 ml ProSource Plus BID, each supplement provides 100 kcals and 15 grams protein.   Continue Magic cup BID with meals, each supplement provides 290 kcal and 9 grams of protein  D/C Nepro Shakes, not drinking  NUTRITION DIAGNOSIS:   Severe Malnutrition related to chronic illness as evidenced by severe muscle depletion, mild fat depletion, severe fat depletion, moderate muscle depletion, energy intake < or equal to 75% for > or equal to 1 month.  GOAL:   Patient will meet greater than or equal to 90% of their needs  Being addressed  MONITOR:   PO intake, Supplement acceptance, Labs  REASON FOR ASSESSMENT:   Consult Assessment of nutrition requirement/status  ASSESSMENT:   PMH significant for ESRD on iHD (MWF), chronic hypotension on Midodrine , hx of small AVMs, paroxysmal a. Fib s/p watchman not on [facility] who presented to the orthopedic clinic for R foot gangrene with a recent hx of ESBL Bacteremia and sepsis presumed to be due to osteomyelitis now s/p R transmetatarsal amputation on 08/25/2024.  10/11 Admitted 10/13 [facility]: severe pulmonary arterial HTN, sig RV failure  Echo showed EF >75% with severe RV dilation and severe RV dysfunction likely secondary to long standing pulmonary HTN  Remains on CRRT, CVP 16-17, ran net even yesterday, noted UF goal  50-100 mL/hr Levophed  at 8-10 mcg/min Vasopressin  0.03 units/min Dobutamine 2.5 mcg/kg/min  Pt has been experiencing visual hallucinations, mentation improved this AM.   Pt  ate good breakfast this AM, recorded po intake 25-50% of meals yesterday. 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 Pt reports he has been doing his exercises as outpatient to get stronger; included some resistance training, walking and other cardiovascular fitness.   Pt reports he on the days he works; he has Black Coffee with Crackers and Boiled Eggs for Breakfast; skips lunch as does not want to eat at work and then has a good dinner. On dialysis days, pt eats a snack at [facility] in AM, has a big lunch, then small dinner.   Pt not taking protein supplements at home, except they give him a protein bar at dialysis (that he does not really like but tries to eat). Pt not drinking Nepro shakes, does not like liquid protein drinks like that. Pt is however eating the Magic Cup supplements; pt agreeable to trying Pro-source protein modular. Will allow double protein at meals, add bedtime snack  Pt reports taking Vitamin C (500 mg daily), Vitamin D  at home. Noted MVI in home med  list, but unsure which one. Plan to add Renal MVI daily, increase to BID while on CRRT  Wounds initially classified as DTI to bilateral buttocks; WOC evaluated, suspect skin tear, friction and not DTIs  Labs: Phosphorus 1.9 (L)-receiving 30 mmol IV KPhos today, on CRRT Phos Protocol Potassium 3.5 (wdl) Magnesium  2.3 (wdl) Sodium 134 (wdl)   Meds: Protonix  Miralax   prn Midodrine  Plavix  calcitriol                                                                                         NUTRITION - FOCUSED PHYSICAL EXAM: Appears to have some non-pitting facial edema, question degree of facial losses  Flowsheet Row Most Recent Value  Orbital Region Unable to assess  Upper Arm Region Severe depletion  Thoracic and Lumbar Region Unable to assess  Buccal Region Mild depletion  Temple Region Unable to assess  Clavicle Bone Region Severe depletion  Clavicle and Acromion Bone Region Severe depletion  Scapular Bone Region Severe depletion  Dorsal Hand Moderate depletion  Patellar Region Severe depletion  Anterior Thigh Region Severe depletion  Posterior Calf Region Moderate depletion  Edema (RD Assessment) Mild  Hair Reviewed  Eyes Reviewed  Mouth Reviewed  Skin --  [lots of loose skin, hx of weight loss (used to weigh 350 pounds)]  Nails Reviewed     Diet Order:  RENAL DIET with 1200 mL Fluid Restriction  EDUCATION NEEDS:   Education needs have been addressed  Skin:  Skin Assessment: Skin Integrity Issues: Skin Integrity Issues:: Other (Comment) DTI: n/a Incisions: R TMA on 08/25/24, still wrapped in bandage Other: R buttock-skin tear (initially documented as possible DTI), L buttock-small linear dark line-likely friction per WOC  Last BM:  10/20  Height:   Ht Readings from Last 1 Encounters:  08/25/24 6' 0.99 (1.854 m)    Weight:   Wt Readings from Last 1 Encounters:  09/03/24 92.2 kg    Ideal Body Weight:  80.9 kg  BMI:  Body mass index is 26.82 kg/m.  Estimated Nutritional Needs:   Kcal:  2200-2400 kcals  Protein:  125-145 g  Fluid:  1000 mL plus UOP   Betsey Finger MS, RDN, LDN, CNSC Registered Dietitian 3 Clinical Nutrition RD Inpatient Contact Info in [place]

## 2024-09-03 NOTE — Plan of Care (Signed)
 Progressing Add All Education: Knowledge of General Education information will improve Add Today at 2306 - Progressing by Rolfe Corean HERO, RN Add Health Behavior/Discharge Planning: Ability to manage health-related needs will improve Add Today at 2306 - Progressing by Rolfe Corean HERO, RN Add Clinical Measurements: Ability to maintain clinical measurements within normal limits will improve Add Today at 2306 - Progressing by Rolfe Corean HERO, RN Add Will remain free from infection Add Today at 2306 - Progressing by Rolfe Corean HERO, RN Add Diagnostic test results will improve Add Today at 2306 - Progressing by Rolfe Corean HERO, RN Add Respiratory complications will improve Add Today at 2306 - Progressing by Rolfe Corean HERO, RN Add Cardiovascular complication will be avoided Add Today at 2306 - Progressing by Rolfe Corean HERO, RN Add Activity: Risk for activity intolerance will decrease Add Today at 2306 - Progressing by Rolfe Corean HERO, RN Add Nutrition: Adequate nutrition will be maintained Add Today at 2306 - Progressing by Rolfe Corean HERO, RN Add Coping: Level of anxiety will decrease Add Today at 2306 - Progressing by Rolfe Corean HERO, RN Add Elimination: Will not experience complications related to bowel motility Add Today at 2306 - Progressing by Rolfe Corean HERO, RN Add Will not experience complications related to urinary retention Add Today at 2306 - Progressing by Rolfe Corean HERO, RN Add Pain Managment: General experience of comfort will improve and/or be controlled Add Today at 2306 - Progressing by Rolfe Corean HERO, RN Add Safety: Ability to remain free from injury will improve Add Today at 2306 - Progressing by Rolfe Corean HERO, RN Add Skin Integrity: Risk for impaired skin integrity will decrease Add Today at 2306 - Progressing by Rolfe Corean HERO, RN Add Education: Understanding of CV disease, CV risk reduction, and recovery process will improve Add Today at 2306 - Progressing by Rolfe Corean HERO, RN Add Individualized Educational Video(s) Add Today at 2306 - Progressing by Rolfe Corean HERO, RN Add Activity: Ability to return to baseline activity level will improve Add Today at 2306 - Progressing by Rolfe Corean HERO, RN Add Cardiovascular: Ability to achieve and maintain adequate cardiovascular perfusion will improve Add Today at 2306 - Progressing by Rolfe Corean HERO, RN Add Vascular access site(s) Level 0-1 will be maintained Add Today at 2306 - Progressing by Rolfe Corean HERO, RN Add Health Behavior/Discharge Planning: Ability to safely manage health-related needs after discharge will improve Add Today at 2306 - Progressing by Rolfe Corean HERO, RN Add
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
## 2024-09-03 NOTE — Progress Notes (Signed)
 eLink Physician-Brief Progress Note Patient Name: [name] DOB: 19-Jul-1966 MRN: [id number]   Date of Service  09/03/2024  HPI/Events of Note  BSRN asking for prn dose of tramadol for foot pain, Tylenol  not helping much  Reportedly Tramadol 25 mg PO helped earlier Opiates and gabapentin affects his mentation as per patient  eICU Interventions  Tramadol 25 mg PO q 6 prn ordered x 2 doses Bedside rounding team to reassess Discussed with CHAR Perkins T Andie Mungin 09/03/2024, 9:54 PM

## 2024-09-03 NOTE — Progress Notes (Signed)
 [name], male   DOB: 10-23-1966, 58 y.o.   MRN: [id number] [facility] Progress Note   Assessment/ Plan:   1.  Acute exacerbation of congestive heart failure with cardiogenic shock: Suspected to have associated septic component as well for which he is on Zyvox  and meropenem.  Continues to have pressor dependent hypotension on high-dose midodrine  and ongoing Levophed  drip along with dobutamine.  2. ESRD: Status post failure of renal allograft following which he was restarted back on dialysis.  Ultrafiltration limited by worsening pressor requirements overnight however, with his elevated CVP, tachycardia and worsening hyponatremia, will reattempt UF at 50 cc/h and escalate as tolerated. 3. Anemia: Without overt blood loss, hemoglobin/hematocrit appear stable overnight, continue to monitor with ESA dosing. 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 5.  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  Hyponatremia: Monitor with ultrafiltration/CRRT and limit oral fluid intake.  Subjective:   Net even from fluid standpoint overnight because of worsening hypotension/pressor requirements and tachycardia.   Objective:   BP (!) 112/53   Pulse (!) 114   Temp 97.9 F (36.6 C) (Oral)   Resp (!) 25   Ht 6' 0.99 (1.854 m)   Wt 92.2 kg   SpO2 100%   BMI 26.82 kg/m   Physical Exam: Gen: Appears comfortable resting in bed, awake/alert CVS: Pulse regular tachycardia, S1 and S2 without murmur/gallop.  Temporary right IJ HD catheter Resp: Diminished breath sounds over bases, no distinct rales or rhonchi. Abd: Soft, flat, nontender, bowel sounds normal Ext: Left RCF with intact dressings.  Right leg status post TMA in Ace wrap dressing.  Left leg no edema  Labs: BMET Recent Labs  Lab 08/31/24 0236 08/31/24 0244 08/31/24 1529 09/01/24 0417 09/01/24 1552 09/02/24 0458  09/02/24 1536 09/03/24 0456  NA 133* 135 135 136 135 135 133* 134*  K 3.5 3.6 3.4* 4.0 3.6 3.4* 3.6 3.5  CL 98  --  100 102 103 100 101 100  CO2 21*  --  22 23 23 22 22 23   GLUCOSE 144*  --  143* 126* 108* 89 132* 115*  BUN 24*  --  21* 17 16 17 18 18   CREATININE 3.76*  --  3.03* 2.51* 2.37* 2.08* 2.23* 2.15*  CALCIUM  7.5*  --  7.5* 7.7* 7.5* 7.7* 7.3* 7.4*  PHOS 2.1*  --  1.8* 2.6 2.1* 1.5* 2.7 1.9*   CBC Recent Labs  Lab 08/27/24 0956 08/27/24 1303 08/29/24 0344 08/30/24 0810 08/31/24 0236 08/31/24 0244 09/01/24 0417 09/02/24 0458 09/03/24 0456  WBC 15.8*   < > 17.1*   < > 13.8*  --  14.2* 9.6 10.0  NEUTROABS 12.7*  --  14.0*  --   --   --   --   --   --   HGB 9.0*   < > 8.9*   < > 8.2* 9.5* 8.7* 8.8* 8.7*  HCT 31.4*   < > 27.7*   < > 26.3* 28.0* 28.2* 28.9* 28.5*  MCV 102.6*   < > 91.4   < > 92.6  --  94.6 96.3 96.6  PLT 326   < > 345   < > 323  --  281 254 263   < > = values in this interval not displayed.      Medications:     allopurinol  100 mg Oral q AM   amiodarone   200 mg Oral  BID   aspirin  EC  81 mg Oral Daily   atorvastatin   80 mg Oral QHS   calcitRIOL   1 mcg Oral Q M,W,F-HD   Chlorhexidine  Gluconate Cloth  6 each Topical Daily   clopidogrel   75 mg Oral Q breakfast   famotidine   10 mg Oral QHS   feeding supplement (NEPRO CARB STEADY)  237 mL Oral BID BM   ferric citrate   420 mg Oral TID WC   [name]   Topical BID   heparin   5,000 Units Subcutaneous Q8H   midodrine   20 mg Oral Q8H   pantoprazole   40 mg Oral Daily   QUEtiapine  50 mg Oral BID   sodium chloride  flush  3 mL Intravenous Q12H   tamsulosin   0.4 mg Oral QPM   Gordy Blanch, MD 09/03/2024, 8:18 AM

## 2024-09-03 NOTE — Progress Notes (Addendum)
 NAME:  [name], MRN:  [id number], DOB:  05/05/1966, LOS: 6 ADMISSION DATE:  08/25/2024, CONSULTATION DATE:  08/27/24 REFERRING MD:  Dr. Camellia Door, CHIEF COMPLAINT:  bradycardia, hypotension   History of Present Illness:  [name] is a 58 y/o M with a PMH significant for ESRD on iHD (MWF), chronic hypotension on Midodrine , hx of small AVMs, paroxysmal a. Fib s/p watchman not on [facility] who presented to the orthopedic clinic for R foot gangrene with a recent hx of ESBL Bacteremia and sepsis presumed to be due to osteomyelitis now s/p R transmetatarsal amputation on 08/25/2024. This AM he developed some confusion, bradycardia, and hypotension requiring atropine administration and vasopressors. The patient was moved to ICU for hemodynamic monitoring and further care   Pertinent  Medical History  As above  Significant Hospital Events: Including procedures, antibiotic start and stop dates in addition to other pertinent events   08/25/2024 admission to the hospital for R transmetatarsal amputation 08/27/2024 admitted to ICU for hypotension and bradycardia, CTA PE negative. TTE with severely enlarged R ventricle, new. NSTEMI with troponin >24K; cardiology started on heparin  gtt and ASA 10/14 underwent cardiac cath and LAD stenting, remain on epinephrine  and norepinephrine  10/15 patient is requiring multiple vasopressor support, started on CRRT.  Serum troponin remain elevated >24k 10/16 remain on vasopressor support with norepinephrine  and vasopressin .  Epinephrine  was titrated off.  Remain on CRRT with net -300 cc. COOX 46%.  Went into A-fib with RVR requiring amiodarone  infusion 10/17 started getting confused and hallucinating, vasopressor requirement is improving, remain on CRRT with net negative fluid balance 10/18 patient was started on Precedex in the setting of agitation and restlessness.  Remained on CRRT with net -2.5 L.  Remain on dobutamine with Coox 50% 10/20 On CRRT, net neg 4.29 L,  Coox 71.4,    Interim History / Subjective:  No events overnight  On 9 - 10 mcg of levophed  gtt  Vaso 0.04 Dobutamine 2.5 mcg   Net negative 4.2 L, on CRRT   Objective   Blood pressure (!) 112/53, pulse (!) 115, temperature 97.9 F (36.6 C), temperature source Oral, resp. rate (!) 25, height 6' 0.99 (1.854 m), weight 92.2 kg, SpO2 100%. CVP:  [10 mmHg-57 mmHg] 11 mmHg      Intake/Output Summary (Last 24 hours) at 09/03/2024 0757 Last data filed at 09/03/2024 0700 Gross per 24 hour  Intake 1571.53 ml  Output 1625 ml  Net -53.47 ml   Filed Weights   09/01/24 0500 09/02/24 0500 09/03/24 0500  Weight: 92.4 kg 91.2 kg 92.2 kg    Examination: General: acute on chronic adult male, lying in icu bed in NAD  HEENT: Normocephalic, PERRLA intact, Poor dentition, missing teeth CV: s1,s2, RRR, no MRG, No JVD  pulm: clear, diminished, no distress on vent  Abs: bs active, soft  Extremities: no edema, right hand contracture, right foot amputation- dressing stable with ace wrap, moves all extremities on command Skin: no rash  Neuro: Rass 0, follows commands  GU: deferred   Labs reviewed  Patient Lines/Drains/Airways Status     Active Line/Drains/Airways     Name Placement date Placement time Site Days   Arterial Line 08/27/24 Right Femoral 08/27/24  1200  Femoral  6   CVC Triple Lumen 08/27/24 Left Femoral 08/27/24  1500  -- 6   Fistula / Graft Left Forearm Arteriovenous fistula --  --  Forearm  --   Hemodialysis Catheter Right Internal jugular Triple lumen Temporary (Non-Tunneled) 08/29/24  1000  Internal jugular  4   Midline Single Lumen 08/17/24 Right Cephalic 8 cm 0 cm 08/17/24  1541  Cephalic  16   Negative Pressure Wound Therapy Leg Left;Lower 11/30/22  1518  --  642   Negative Pressure Wound Therapy Leg Left;Lower;Anterior 03/22/23  1427  --  530   Wound 08/12/24 2300 Atypical Toe (Comment  which one) Anterior;Right 08/12/24  2300  Toe (Comment  which one)  21   Wound  08/25/24 1330 Other (Comment) Toe (Comment  which one) Anterior;Left 08/25/24  1330  Toe (Comment  which one)  8   Wound 08/29/24 0800 Pressure Injury Buttocks Right Deep Tissue Pressure Injury - Purple or maroon localized area of discolored intact skin or blood-filled blister due to damage of underlying soft tissue from pressure and/or shear. 08/29/24  0800  Buttocks  4   Wound 08/29/24 0800 Pressure Injury Buttocks Left Deep Tissue Pressure Injury - Purple or maroon localized area of discolored intact skin or blood-filled blister due to damage of underlying soft tissue from pressure and/or shear. 08/29/24  0800  Buttocks  4         Resolved Hospital Problem list   Hypoglycemia Symptomatic bradycardia, resolved Lactic acidosis QTc prolongation, resolved   Assessment & Plan:  Acute on chronic cor pulmonale with cardiogenic shock Acute NSTEMI status post DES to LAD Severe pulmonary hypertension Mixed cardiogenic and septic shock, improving Paroxysmal A-fib status post Watchman device, currently in sinus rhythm Recent history of ESBL E. coli bacteremia and right forefoot osteomyelitis status post amputation End-stage renal disease on hemodialysi on CRRT Hypokalemia/hypophosphatemia Deep tissue injury on bilateral buttock, unable to determine if it was present on admission Anemia of renal disease Acute hyperactive delirium Hypervolemic hyponatremia P:  Advance heart failure team following, appreciate assistance  Continue dobutamine at 2.5 mcgs, currently cox 71.4 Still requiring low dose levophed  gtt, but now on 10mcg of levophed , patient is now net negative 4.29 L via CRRT, may be at limit of fluid removal, CVP 13-15 Continue aspirin , plavix , statin  Continue po amio P.O Has receive 5 days of abx- meropenem and Zyvox , NGTD per blood cx Hgb remains stable  Off precedex, will dc order  Continue to monitor and trend Na Replace K and phos  Continue seroquel  Continue wound care      Labs   CBC: Recent Labs  Lab 08/27/24 0956 08/27/24 1303 08/29/24 0344 08/30/24 0810 08/31/24 0236 08/31/24 0244 09/01/24 0417 09/02/24 0458 09/03/24 0456  WBC 15.8*   < > 17.1* 13.7* 13.8*  --  14.2* 9.6 10.0  NEUTROABS 12.7*  --  14.0*  --   --   --   --   --   --   HGB 9.0*   < > 8.9* 8.9* 8.2* 9.5* 8.7* 8.8* 8.7*  HCT 31.4*   < > 27.7* 28.2* 26.3* 28.0* 28.2* 28.9* 28.5*  MCV 102.6*   < > 91.4 92.2 92.6  --  94.6 96.3 96.6  PLT 326   < > 345 342 323  --  281 254 263   < > = values in this interval not displayed.    Basic Metabolic Panel: Recent Labs  Lab 08/30/24 0408 08/30/24 1557 08/31/24 0236 08/31/24 0244 09/01/24 0417 09/01/24 1552 09/02/24 0458 09/02/24 1536 09/03/24 0456  NA 129*   < > 133*   < > 136 135 135 133* 134*  K 4.6   < > 3.5   < > 4.0 3.6 3.4*  3.6 3.5  CL 94*   < > 98   < > 102 103 100 101 100  CO2 19*   < > 21*   < > 23 23 22 22 23   GLUCOSE 134*   < > 144*   < > 126* 108* 89 132* 115*  BUN 30*   < > 24*   < > 17 16 17 18 18   CREATININE 5.75*   < > 3.76*   < > 2.51* 2.37* 2.08* 2.23* 2.15*  CALCIUM  7.1*   < > 7.5*   < > 7.7* 7.5* 7.7* 7.3* 7.4*  MG 2.3  --  2.5*  --  2.6*  --  2.6*  --  2.3  PHOS 3.5   < > 2.1*   < > 2.6 2.1* 1.5* 2.7 1.9*   < > = values in this interval not displayed.   GFR: Estimated Creatinine Clearance: 43.4 mL/min (A) (by C-G formula based on SCr of 2.15 mg/dL (H)). Recent Labs  Lab 08/28/24 1101 08/28/24 1447 08/28/24 2107 08/29/24 0913 08/29/24 1336 08/30/24 0810 08/31/24 0236 08/31/24 0916 09/01/24 0417 09/02/24 0458 09/03/24 0456  PROCALCITON 45.77  --   --   --   --   --   --  22.24  --   --   --   WBC  --   --    < >  --   --    < > 13.8*  --  14.2* 9.6 10.0  LATICACIDVEN 3.1* 3.3*  --  1.3 1.6  --   --   --   --   --   --    < > = values in this interval not displayed.    Liver Function Tests: Recent Labs  Lab 08/27/24 1814 08/28/24 1447 08/29/24 0344 08/29/24 1555 09/01/24 0417  09/01/24 1552 09/02/24 0458 09/02/24 1536 09/03/24 0456  AST 287*  --  1,820*  --   --   --   --   --   --   ALT 30  --  90*  --   --   --   --   --   --   ALKPHOS 174*  --  208*  --   --   --   --   --   --   BILITOT 0.8  --  0.9  --   --   --   --   --   --   PROT 6.4*  --  6.5  --   --   --   --   --   --   ALBUMIN  2.6*   < > 2.5*   < > 2.8* 2.6* 2.6* 2.5* 2.5*   < > = values in this interval not displayed.   Recent Labs  Lab 08/28/24 1101  LIPASE 28   Recent Labs  Lab 08/31/24 0236  AMMONIA 23    ABG    Component Value Date/Time   PHART 7.439 08/31/2024 0244   PCO2ART 35.1 08/31/2024 0244   PO2ART 73 (L) 08/31/2024 0244   HCO3 23.8 08/31/2024 0244   TCO2 25 08/31/2024 0244   ACIDBASEDEF 14.0 (H) 08/27/2024 1303   O2SAT 71.4 09/03/2024 0456     Coagulation Profile: No results for input(s): INR, PROTIME in the last 168 hours.  Cardiac Enzymes: No results for input(s): CKTOTAL, CKMB, CKMBINDEX, TROPONINI in the last 168 hours.  HbA1C: Hgb A1c MFr Bld  Date/Time Value Ref Range Status  08/30/2024 04:08  AM 5.7 (H) 4.8 - 5.6 % Final    Comment:    (NOTE) Diagnosis of Diabetes The following HbA1c ranges recommended by the American Diabetes Association (ADA) may be used as an aid in the diagnosis of diabetes mellitus.  Hemoglobin             Suggested A1C NGSP%              Diagnosis  <5.7                   Non Diabetic  5.7-6.4                Pre-Diabetic  >6.4                   Diabetic  <7.0                   Glycemic control for                       adults with diabetes.    03/31/2021 05:38 AM 5.6 4.8 - 5.6 % Final    Comment:    (NOTE) Pre diabetes:          5.7%-6.4%  Diabetes:              >6.4%  Glycemic control for   <7.0% adults with diabetes     CBG: Recent Labs  Lab 09/02/24 1536 09/02/24 2000 09/03/24 0023 09/03/24 0339 09/03/24 0726  GLUCAP 127* 117* 126* 107* 117*    Christian Raneem [place]olia AGACNP-BC   [facility]  CC: 50 mins   09/03/2024, 8:22 AM  Please see [email or web address] for pager details.  From 7A-7P if no response, please call [phone]. After hours, please call ELink [phone].

## 2024-09-03 NOTE — Consult Note (Addendum)
 WOC Nurse wound follow up Wound type: initially classified as a DTI, but the wound remains linear, more compatible as a skin tear on R buttock.  Measurement: 3 cm x 0.5 cm, skin tear type 2. Wound bed: 100% pink red, shalow. Drainage (amount, consistency, odor) Minimum amount, no odor, serous. Periwound: intact. Dressing procedure/placement/frequency: Cleanse with saline, pat dry. Apply a single layer of Xeroform daily and cover with foam dressing. It is ok to lift the foam and reapply the Xeroform. The foam can stay up to 3 days.     Wound type: L buttock - discolor scar and linear dark skin. Can be related to friction. Measurement: 2 cm x 0.3 cm. Wound bed: no skin breakdown.  Drainage (amount, consistency, odor) None Periwound: intact. Dressing procedure/placement/frequency: Cleanse with saline, pat dry. Apply a single layer of Xeroform daily and cover with foam dressing. It is ok to lift the foam and reapply the Xeroform. The foam can stay up to 3 days.    WOC team will follow weekly. Please reconsult if further assistance is needed. Thank-you,  Lela Holm RN, CNS, [employer], MSN.  (Phone [phone])

## 2024-09-04 ENCOUNTER — Other Ambulatory Visit (HOSPITAL_COMMUNITY): Payer: Self-pay

## 2024-09-04 DIAGNOSIS — I214 Non-ST elevation (NSTEMI) myocardial infarction: Secondary | ICD-10-CM | POA: Diagnosis not present

## 2024-09-04 DIAGNOSIS — R570 Cardiogenic shock: Secondary | ICD-10-CM | POA: Diagnosis not present

## 2024-09-04 DIAGNOSIS — E43 Unspecified severe protein-calorie malnutrition: Secondary | ICD-10-CM | POA: Insufficient documentation

## 2024-09-04 DIAGNOSIS — I2781 Cor pulmonale (chronic): Secondary | ICD-10-CM | POA: Diagnosis not present

## 2024-09-04 DIAGNOSIS — Z1612 Extended spectrum beta lactamase (ESBL) resistance: Secondary | ICD-10-CM | POA: Diagnosis not present

## 2024-09-04 LAB — CBC
HCT: 28.1 % — ABNORMAL LOW (ref 39.0–52.0)
Hemoglobin: 8.6 g/dL — ABNORMAL LOW (ref 13.0–17.0)
MCH: 29.9 pg (ref 26.0–34.0)
MCHC: 30.6 g/dL (ref 30.0–36.0)
MCV: 97.6 fL (ref 80.0–100.0)
Platelets: 264 K/uL (ref 150–400)
RBC: 2.88 MIL/uL — ABNORMAL LOW (ref 4.22–5.81)
RDW: 20.7 % — ABNORMAL HIGH (ref 11.5–15.5)
WBC: 12 K/uL — ABNORMAL HIGH (ref 4.0–10.5)
nRBC: 0 % (ref 0.0–0.2)

## 2024-09-04 LAB — RENAL FUNCTION PANEL
Albumin: 2.4 g/dL — ABNORMAL LOW (ref 3.5–5.0)
Anion gap: 11 (ref 5–15)
BUN: 18 mg/dL (ref 6–20)
CO2: 22 mmol/L (ref 22–32)
Calcium: 7.5 mg/dL — ABNORMAL LOW (ref 8.9–10.3)
Chloride: 101 mmol/L (ref 98–111)
Creatinine, Ser: 2.4 mg/dL — ABNORMAL HIGH (ref 0.61–1.24)
GFR, Estimated: 31 mL/min — ABNORMAL LOW (ref >60)
Glucose, Bld: 111 mg/dL — ABNORMAL HIGH (ref 70–99)
Phosphorus: 2.3 mg/dL — ABNORMAL LOW (ref 2.5–4.6)
Potassium: 4.2 mmol/L (ref 3.5–5.1)
Sodium: 134 mmol/L — ABNORMAL LOW (ref 135–145)

## 2024-09-04 LAB — COOXEMETRY PANEL
Carboxyhemoglobin: 3 % — ABNORMAL HIGH (ref 0.5–1.5)
Carboxyhemoglobin: 3 % — ABNORMAL HIGH (ref 0.5–1.5)
Methemoglobin: <0.7 % (ref 0.0–1.5)
Methemoglobin: 0.9 % (ref 0.0–1.5)
O2 Saturation: 100 %
O2 Saturation: 58.2 %
Total hemoglobin: 9.2 g/dL — ABNORMAL LOW (ref 12.0–16.0)
Total hemoglobin: 9.6 g/dL — ABNORMAL LOW (ref 12.0–16.0)

## 2024-09-04 LAB — GLUCOSE, CAPILLARY
Glucose-Capillary: 100 mg/dL — ABNORMAL HIGH (ref 70–99)
Glucose-Capillary: 107 mg/dL — ABNORMAL HIGH (ref 70–99)
Glucose-Capillary: 108 mg/dL — ABNORMAL HIGH (ref 70–99)
Glucose-Capillary: 131 mg/dL — ABNORMAL HIGH (ref 70–99)
Glucose-Capillary: 137 mg/dL — ABNORMAL HIGH (ref 70–99)
Glucose-Capillary: 138 mg/dL — ABNORMAL HIGH (ref 70–99)

## 2024-09-04 LAB — MAGNESIUM: Magnesium: 2.4 mg/dL (ref 1.7–2.4)

## 2024-09-04 MED ORDER — SODIUM PHOSPHATES 45 MMOLE/15ML IV SOLN
15.0000 mmol | Freq: Once | INTRAVENOUS | Status: AC
  Administered 2024-09-04: 15 mmol via INTRAVENOUS
  Filled 2024-09-04: qty 5

## 2024-09-04 NOTE — Progress Notes (Signed)
 [name], male   DOB: Mar 22, 1966, 58 y.o.   MRN: [id number] [facility] Progress Note   Assessment/ Plan:   1.  Acute exacerbation of congestive heart failure with cardiogenic shock: Suspected to have associated septic component as well for which he has completed his antibiotic course.  He remains on pressors/midodrine  and inotropic support.  I suspect that he is nearing his dry weight and we may be able to stop CRRT within the next 24 hours to allow for weaning off of pressors. 2. ESRD: Status post failure of renal allograft following which he was restarted back on dialysis.  Ultrafiltration accomplished overnight with corresponding reduction of CVP/weight; may be able to discontinue this within the next 24 to 48 hours and allow for weaning off of pressors. 3. Anemia: Without overt blood loss, hemoglobin/hematocrit appear stable overnight, continue to monitor with ESA dosing. 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 5.  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  Hyponatremia: Monitor with ultrafiltration/CRRT and limit oral fluid intake.  Subjective:   Able to tolerate ultrafiltration overnight with corresponding improvement of CVP this morning.  He appears to be nearing his dry weight.   Objective:   BP (!) 76/64 (BP Location: Left Leg)   Pulse 75   Temp 98.3 F (36.8 C) (Oral)   Resp 20   Ht 6' 0.99 (1.854 m)   Wt 95.7 kg   SpO2 100%   BMI 27.84 kg/m   Physical Exam: Gen: Appears comfortable resting in bed, awake/alert and eating breakfast CVS: Regular rhythm/normal rate.  S1 and S2 normal.  Temporary right IJ HD catheter Resp: Diminished breath sounds over bases, no distinct rales or rhonchi. Abd: Soft, flat, nontender, bowel sounds normal Ext: Left RCF with poor outflow thrill and suboptimal augmentation indicative of either inflow cephalic vein  stenosis versus accessory vein.  Right leg status post TMA in Ace wrap dressing.  Left leg no edema  Labs: BMET Recent Labs  Lab 09/01/24 0417 09/01/24 1552 09/02/24 0458 09/02/24 1536 09/03/24 0456 09/03/24 1519 09/04/24 0426  NA 136 135 135 133* 134* 134* 134*  K 4.0 3.6 3.4* 3.6 3.5 4.0 4.2  CL 102 103 100 101 100 99 101  CO2 23 23 22 22 23 25 22   GLUCOSE 126* 108* 89 132* 115* 142* 111*  BUN 17 16 17 18 18 17 18   CREATININE 2.51* 2.37* 2.08* 2.23* 2.15* 2.07* 2.40*  CALCIUM  7.7* 7.5* 7.7* 7.3* 7.4* 7.5* 7.5*  PHOS 2.6 2.1* 1.5* 2.7 1.9* 2.8 2.3*   CBC Recent Labs  Lab 08/29/24 0344 08/30/24 0810 09/01/24 0417 09/02/24 0458 09/03/24 0456 09/04/24 0426  WBC 17.1*   < > 14.2* 9.6 10.0 12.0*  NEUTROABS 14.0*  --   --   --   --   --   HGB 8.9*   < > 8.7* 8.8* 8.7* 8.6*  HCT 27.7*   < > 28.2* 28.9* 28.5* 28.1*  MCV 91.4   < > 94.6 96.3 96.6 97.6  PLT 345   < > 281 254 263 264   < > = values in this interval not displayed.      Medications:     (feeding supplement) PROSource Plus  30 mL Oral BID BM   allopurinol  100 mg Oral q AM   amiodarone   200 mg Oral BID   aspirin  EC  81 mg Oral Daily   atorvastatin   80 mg Oral QHS   calcitRIOL   1 mcg Oral Q M,W,F-HD   Chlorhexidine  Gluconate Cloth  6 each Topical Daily   clopidogrel   75 mg Oral Q breakfast   famotidine   10 mg Oral QHS   [name]   Topical BID   heparin   5,000 Units Subcutaneous Q8H   midodrine   20 mg Oral Q8H   multivitamin  1 tablet Oral BID   pantoprazole   40 mg Oral Daily   QUEtiapine  50 mg Oral BID   sodium chloride  flush  3 mL Intravenous Q12H   tamsulosin   0.4 mg Oral QPM   Gordy Blanch, MD 09/04/2024, 8:27 AM

## 2024-09-04 NOTE — Progress Notes (Signed)
 NAME:  [name], MRN:  [id number], DOB:  08-12-1966, LOS: 7 ADMISSION DATE:  08/25/2024, CONSULTATION DATE:  08/27/24 REFERRING MD:  Dr. Camellia Door, CHIEF COMPLAINT:  bradycardia, hypotension   History of Present Illness:  [name] is a 58 y/o M with a PMH significant for ESRD on iHD (MWF), chronic hypotension on Midodrine , hx of small AVMs, paroxysmal a. Fib s/p watchman not on [facility] who presented to the orthopedic clinic for R foot gangrene with a recent hx of ESBL Bacteremia and sepsis presumed to be due to osteomyelitis now s/p R transmetatarsal amputation on 08/25/2024. T10/13 he developed some confusion, bradycardia, and hypotension requiring atropine administration and vasopressors. The patient was moved to ICU for hemodynamic monitoring and further care   Pertinent  Medical History  As above  Significant Hospital Events: Including procedures, antibiotic start and stop dates in addition to other pertinent events   08/25/2024 admission to the hospital for R transmetatarsal amputation 08/27/2024 admitted to ICU for hypotension and bradycardia, CTA PE negative. TTE with severely enlarged R ventricle, new. NSTEMI with troponin >24K; cardiology started on heparin  gtt and ASA 10/14 underwent cardiac cath and LAD stenting, remain on epinephrine  and norepinephrine  10/15 patient is requiring multiple vasopressor support, started on CRRT.  Serum troponin remain elevated >24k 10/16 remain on vasopressor support with norepinephrine  and vasopressin .  Epinephrine  was titrated off.  Remain on CRRT with net -300 cc. COOX 46%.  Went into A-fib with RVR requiring amiodarone  infusion 10/17 started getting confused and hallucinating, vasopressor requirement is improving, remain on CRRT with net negative fluid balance 10/18 patient was started on Precedex in the setting of agitation and restlessness.  Remained on CRRT with net -2.5 L.  Remain on dobutamine with Coox 50% 10/19 off precedex 10/20 On CRRT,  net neg 4.29 L, Coox 71.4,    Interim History / Subjective:  Remains on DBA 2.5, NE 9, vaso 0.03, midodrine  20mg  q8hr Coox 100> suspect erroneous, rechecked > 58 HR intermittent SR/ afib but rate controlled CVP 7-8 Afebrile, WBC 10> 12 Ongoing CRRT, -1.29L/ 24hrs.  BP tolerated UF -163ml/hr  Mild headache and right foot pain, otherwise slept good.  Has not been wearing CPAP at night due to parts of mask missing.  Denies SOB/ CP  Objective   Blood pressure (!) 76/64, pulse 74, temperature 98.3 F (36.8 C), temperature source Oral, resp. rate (!) 21, height 6' 0.99 (1.854 m), weight 95.7 kg, SpO2 99%. CVP:  [12 mmHg-33 mmHg] 14 mmHg      Intake/Output Summary (Last 24 hours) at 09/04/2024 0740 Last data filed at 09/04/2024 0700 Gross per 24 hour  Intake 1843.83 ml  Output 3134 ml  Net -1290.17 ml   Filed Weights   09/02/24 0500 09/03/24 0500 09/04/24 0600  Weight: 91.2 kg 92.2 kg 95.7 kg    Examination: General:  chronically ill appearing male sitting in bed in NAD HEENT: MM pink/moist, no JVD, poor dentition  Neuro: Alert, oriented x 3, MAE, right hand contracture CV: irir, afib rate 70s, R internal jugular trialysis, R femoral aline, L femoral CVL, LUE AVF (upper lessened B/T, strong in lower) PULM:  non labored, clear, more diminished on right, on 2L overnight GI: soft, bs+, NT Extremities: warm/dry, no pitting LE edema, right foot in ace bandage Skin: no rashes   -1.29L/ 24hrs Net -5.5L Wts 91.2> 92.2> 95.7?  Labs reviewed> Na 134, K 4.2, BUN/ sCr 17/ 2.07> 18/ 2.4, phos 2.3, Mag 2.4, wBC 10> 12, H/H  stable   Patient Lines/Drains/Airways Status     Active Line/Drains/Airways     Name Placement date Placement time Site Days   Arterial Line 08/27/24 Right Femoral 08/27/24  1200  Femoral  6   CVC Triple Lumen 08/27/24 Left Femoral 08/27/24  1500  -- 6   Fistula / Graft Left Forearm Arteriovenous fistula --  --  Forearm  --   Hemodialysis Catheter Right Internal  jugular Triple lumen Temporary (Non-Tunneled) 08/29/24  1000  Internal jugular  4   Midline Single Lumen 08/17/24 Right Cephalic 8 cm 0 cm 08/17/24  1541  Cephalic  16   Negative Pressure Wound Therapy Leg Left;Lower 11/30/22  1518  --  642   Negative Pressure Wound Therapy Leg Left;Lower;Anterior 03/22/23  1427  --  530   Wound 08/12/24 2300 Atypical Toe (Comment  which one) Anterior;Right 08/12/24  2300  Toe (Comment  which one)  21   Wound 08/25/24 1330 Other (Comment) Toe (Comment  which one) Anterior;Left 08/25/24  1330  Toe (Comment  which one)  8   Wound 08/29/24 0800 Pressure Injury Buttocks Right Deep Tissue Pressure Injury - Purple or maroon localized area of discolored intact skin or blood-filled blister due to damage of underlying soft tissue from pressure and/or shear. 08/29/24  0800  Buttocks  4   Wound 08/29/24 0800 Pressure Injury Buttocks Left Deep Tissue Pressure Injury - Purple or maroon localized area of discolored intact skin or blood-filled blister due to damage of underlying soft tissue from pressure and/or shear. 08/29/24  0800  Buttocks  4         Resolved Hospital Problem list   Hypoglycemia Symptomatic bradycardia, resolved Lactic acidosis QTc prolongation, resolved   Assessment & Plan:  Acute on chronic cor pulmonale with cardiogenic shock Acute NSTEMI status post DES to LAD Severe pulmonary hypertension Mixed cardiogenic and septic shock, improving Paroxysmal A-fib status post Watchman device, currently in sinus rhythm Recent history of ESBL E. 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 End-stage renal disease on hemodialysi on CRRT Hypokalemia/hypophosphatemia Deep tissue injury on bilateral buttock, unable to determine if it was present on admission Anemia of renal disease Acute hyperactive delirium Hypervolemic hyponatremia Malnutrition  Delirium, resolved P:  - Appreciate AHF assistance  -  trending CVP and coox - DBA per HF.  Fick 5.7, CI 2.57, SVR 841> decreasing to 1 today - amiodarone  200mg  BID, monitor closely for recurrent bradycardia - ASA/ plavix / statin - VQ scan when more hemodynamically stable  - SVR improving off abx, remains afebrile.  WBC 10-12.  Continue to monitor off abx, completed 5 day course linezolid / meropenum 10/19 - decreasing UF goal to -50 today - Nephrology following for CRRT  - cont to wean pressors- NE/ vaso for SBP > 90, MAP > 60 - consider changing lines out of femoral access to start mobilization  - off precedex 10/19, monitor, ongoing delirium precautions - trending CBC/ renal indices, strict I/Os, wts - seroquel BID - cont to optimize nutrition - ongoing wound care   Labs   CBC: Recent Labs  Lab 08/29/24 0344 08/30/24 0810 08/31/24 0236 08/31/24 0244 09/01/24 0417 09/02/24 0458 09/03/24 0456 09/04/24 0426  WBC 17.1*   < > 13.8*  --  14.2* 9.6 10.0 12.0*  NEUTROABS 14.0*  --   --   --   --   --   --   --   HGB 8.9*   < > 8.2* 9.5* 8.7*  8.8* 8.7* 8.6*  HCT 27.7*   < > 26.3* 28.0* 28.2* 28.9* 28.5* 28.1*  MCV 91.4   < > 92.6  --  94.6 96.3 96.6 97.6  PLT 345   < > 323  --  281 254 263 264   < > = values in this interval not displayed.    Basic Metabolic Panel: Recent Labs  Lab 08/31/24 0236 08/31/24 0244 09/01/24 0417 09/01/24 1552 09/02/24 0458 09/02/24 1536 09/03/24 0456 09/03/24 1519 09/04/24 0426  NA 133*   < > 136   < > 135 133* 134* 134* 134*  K 3.5   < > 4.0   < > 3.4* 3.6 3.5 4.0 4.2  CL 98   < > 102   < > 100 101 100 99 101  CO2 21*   < > 23   < > 22 22 23 25 22   GLUCOSE 144*   < > 126*   < > 89 132* 115* 142* 111*  BUN 24*   < > 17   < > 17 18 18 17 18   CREATININE 3.76*   < > 2.51*   < > 2.08* 2.23* 2.15* 2.07* 2.40*  CALCIUM  7.5*   < > 7.7*   < > 7.7* 7.3* 7.4* 7.5* 7.5*  MG 2.5*  --  2.6*  --  2.6*  --  2.3  --  2.4  PHOS 2.1*   < > 2.6   < > 1.5* 2.7 1.9* 2.8 2.3*   < > = values in this interval not  displayed.   GFR: Estimated Creatinine Clearance: 38.8 mL/min (A) (by C-G formula based on SCr of 2.4 mg/dL (H)). Recent Labs  Lab 08/28/24 1101 08/28/24 1447 08/28/24 2107 08/29/24 0913 08/29/24 1336 08/30/24 0810 08/31/24 0916 09/01/24 0417 09/02/24 0458 09/03/24 0456 09/04/24 0426  PROCALCITON 45.77  --   --   --   --   --  22.24  --   --   --   --   WBC  --   --    < >  --   --    < >  --  14.2* 9.6 10.0 12.0*  LATICACIDVEN 3.1* 3.3*  --  1.3 1.6  --   --   --   --   --   --    < > = values in this interval not displayed.    Liver Function Tests: Recent Labs  Lab 08/29/24 0344 08/29/24 1555 09/02/24 0458 09/02/24 1536 09/03/24 0456 09/03/24 1519 09/04/24 0426  AST 1,820*  --   --   --   --   --   --   ALT 90*  --   --   --   --   --   --   ALKPHOS 208*  --   --   --   --   --   --   BILITOT 0.9  --   --   --   --   --   --   PROT 6.5  --   --   --   --   --   --   ALBUMIN  2.5*   < > 2.6* 2.5* 2.5* 2.6* 2.4*   < > = values in this interval not displayed.   Recent Labs  Lab 08/28/24 1101  LIPASE 28   Recent Labs  Lab 08/31/24 0236  AMMONIA 23    ABG    Component Value Date/Time   PHART 7.439  08/31/2024 0244   PCO2ART 35.1 08/31/2024 0244   PO2ART 73 (L) 08/31/2024 0244   HCO3 23.8 08/31/2024 0244   TCO2 25 08/31/2024 0244   ACIDBASEDEF 14.0 (H) 08/27/2024 1303   O2SAT 100 09/04/2024 0426     Coagulation Profile: No results for input(s): INR, PROTIME in the last 168 hours.  Cardiac Enzymes: No results for input(s): CKTOTAL, CKMB, CKMBINDEX, TROPONINI in the last 168 hours.  HbA1C: Hgb A1c MFr Bld  Date/Time Value Ref Range Status  08/30/2024 04:08 AM 5.7 (H) 4.8 - 5.6 % Final    Comment:    (NOTE) Diagnosis of Diabetes The following HbA1c ranges recommended by the American Diabetes Association (ADA) may be used as an aid in the diagnosis of diabetes mellitus.  Hemoglobin             Suggested A1C NGSP%               Diagnosis  <5.7                   Non Diabetic  5.7-6.4                Pre-Diabetic  >6.4                   Diabetic  <7.0                   Glycemic control for                       adults with diabetes.    03/31/2021 05:38 AM 5.6 4.8 - 5.6 % Final    Comment:    (NOTE) Pre diabetes:          5.7%-6.4%  Diabetes:              >6.4%  Glycemic control for   <7.0% adults with diabetes     CBG: Recent Labs  Lab 09/03/24 0726 09/03/24 1602 09/03/24 1938 09/03/24 2357 09/04/24 0430  GLUCAP 117* 151* 100* 118* 107*   CRITICAL CARE Performed by: Lyle Pesa   Total critical care time: 38 minutes  Critical care time was exclusive of separately billable procedures and treating other patients.  Critical care was necessary to treat or prevent imminent or life-threatening deterioration.  Critical care was time spent personally by me on the following activities: development of treatment plan with patient and/or surrogate as well as nursing, discussions with consultants, evaluation of patient's response to treatment, examination of patient, obtaining history from patient or surrogate, ordering and performing treatments and interventions, ordering and review of laboratory studies, ordering and review of radiographic studies, pulse oximetry and re-evaluation of patient's condition.    Lyle Pesa, NP [facility] 09/04/2024, 7:40 AM  See Amion for pager If no response to pager , please call [phone] until 7pm After 7:00 pm call Elink  [phone]

## 2024-09-04 NOTE — Progress Notes (Signed)
 [name], male   DOB: 09/12/1966, 58 y.o.   MRN: [id number]     Advanced Heart Failure Rounding Note  Cardiologist: None  Chief Complaint: Cardiogenic shock with RV failure post NSTEMI  Subjective:    Continues on DBA 2.5 + NE 6 + VP 0.03. Co-ox 58%.    On CVVH, I/Os net negative 1290.  CVP now 9.     He is in atrial fibrillation rate 90s, on po amiodarone .    No further hallucinations, awake/oriented.   Objective:   Weight Range: 95.7 kg Body mass index is 27.84 kg/m.   Vital Signs:   Temp:  [97 F (36.1 C)-98.4 F (36.9 C)] 98.3 F (36.8 C) (10/21 0400) Pulse Rate:  [69-122] 72 (10/21 0845) Resp:  [17-31] 26 (10/21 0845) BP: (76)/(64) 76/64 (10/20 1442) SpO2:  [90 %-100 %] 100 % (10/21 0845) Arterial Line BP: (71-130)/(39-73) 103/54 (10/21 0845) Weight:  [95.7 kg] 95.7 kg (10/21 0600) Last BM Date : 09/03/24  Weight change: Filed Weights   09/02/24 0500 09/03/24 0500 09/04/24 0600  Weight: 91.2 kg 92.2 kg 95.7 kg   Intake/Output:   Intake/Output Summary (Last 24 hours) at 09/04/2024 0933 Last data filed at 09/04/2024 0900 Gross per 24 hour  Intake 2096.53 ml  Output 3260 ml  Net -1163.47 ml    Physical Exam   General: NAD Neck: JVP 8-9 cm, no thyromegaly or thyroid nodule.  Lungs: Clear to auscultation bilaterally with normal respiratory effort. CV: Nondisplaced PMI.  Heart irregular S1/S2, no S3/S4, no murmur.  No peripheral edema.    Abdomen: Soft, nontender, no hepatosplenomegaly, no distention.  Skin: Intact without lesions or rashes.  Neurologic: Alert and oriented x 3.  Psych: Normal affect. Extremities: No clubbing or cyanosis.  HEENT: Normal.   Telemetry   AF in 90s (Personally reviewed)   Labs    CBC Recent Labs    09/03/24 0456 09/04/24 0426  WBC 10.0 12.0*  HGB 8.7* 8.6*  HCT 28.5* 28.1*  MCV 96.6 97.6  PLT 263 264   Basic Metabolic Panel Recent Labs    89/79/74 0456 09/03/24 1519 09/04/24 0426  NA 134*  134* 134*  K 3.5 4.0 4.2  CL 100 99 101  CO2 23 25 22   GLUCOSE 115* 142* 111*  BUN 18 17 18   CREATININE 2.15* 2.07* 2.40*  CALCIUM  7.4* 7.5* 7.5*  MG 2.3  --  2.4  PHOS 1.9* 2.8 2.3*   Liver Function Tests Recent Labs    09/03/24 1519 09/04/24 0426  ALBUMIN  2.6* 2.4*   No results for input(s): LIPASE, AMYLASE in the last 72 hours.  Cardiac Enzymes No results for input(s): CKTOTAL, CKMB, CKMBINDEX, TROPONINI in the last 72 hours.  BNP: BNP (last 3 results) Recent Labs    08/27/24 0956  BNP 569.7*    ProBNP (last 3 results) No results for input(s): PROBNP in the last 8760 hours.   D-Dimer No results for input(s): DDIMER in the last 72 hours. Hemoglobin A1C No results for input(s): HGBA1C in the last 72 hours.  Fasting Lipid Panel No results for input(s): CHOL, HDL, LDLCALC, TRIG, CHOLHDL, LDLDIRECT in the last 72 hours.  Thyroid Function Tests No results for input(s): TSH, T4TOTAL, T3FREE, THYROIDAB in the last 72 hours.  Invalid input(s): FREET3  Other results:   Imaging    No results found.     Medications:     Scheduled Medications:  (feeding supplement) PROSource Plus  30 mL Oral BID BM  allopurinol  100 mg Oral q AM   amiodarone   200 mg Oral BID   aspirin  EC  81 mg Oral Daily   atorvastatin   80 mg Oral QHS   calcitRIOL   1 mcg Oral Q M,W,F-HD   Chlorhexidine  Gluconate Cloth  6 each Topical Daily   clopidogrel   75 mg Oral Q breakfast   famotidine   10 mg Oral QHS   [name]   Topical BID   heparin   5,000 Units Subcutaneous Q8H   midodrine   20 mg Oral Q8H   multivitamin  1 tablet Oral BID   pantoprazole   40 mg Oral Daily   QUEtiapine  50 mg Oral BID   sodium chloride  flush  3 mL Intravenous Q12H   tamsulosin   0.4 mg Oral QPM    Infusions:  sodium chloride  10 mL/hr at 08/30/24 0700   DOBUTamine 2.5 mcg/kg/min (09/04/24 0900)   norepinephrine  (LEVOPHED ) Adult infusion 7 mcg/min  (09/04/24 0900)   prismasol BGK 4/2.5 1,500 mL/hr at 09/04/24 0611   prismasol BGK 4/2.5 400 mL/hr at 09/04/24 0610   prismasol BGK 4/2.5 400 mL/hr at 09/04/24 9389   sodium PHOSPHATE  IVPB (in mmol)     vasopressin  0.03 Units/min (09/04/24 0900)    PRN Medications: sodium chloride , acetaminophen , bisacodyl, heparin , mouth rinse, polyethylene glycol, sodium chloride  flush    Patient Profile   [name] is a 58 y.o. AAM with chronic HFpEF, PAF s/p watchman, pSVT, ESRD on iHD, chronic hypotension on midodrine , hx small AVMs, CAD s/p DES to LAD and LCx, hx DVT and OSA on CPAP. AHF team to see for cardiogenic shock with RV failure, s/p NSTEMI, chronic HFpEF.   Assessment/Plan   1. Shock: Suspect mixed cardiogenic and septic shock with low SVR initially.  DBA added for low co-ox of 49%.  Today, remains on NE at 6 + VP 0.03 + DBA 2.5. Co-ox 58%. SVR has been low suggesting component of distributive shock.  - Decrease dobutamine to 1 today.  - Will cut back on CVVH UF which should help wean pressors.  - Can wean NE and VP for SBP > 90 or MAP > 60.  - Continue midodrine  to 20 mg tid.  - He has completed abx.   2. RV failure: Echo showed EF >75% with severe RV dilation and severe RV dysfunction.  RHC 10/13 showed severe pulmonary arterial hypertension with CVP 19 and PCWP 9, significant RV failure.  The RV dysfunction is not explained by coronary disease (no obstructive RCA disease).  He has a history of pulmonary hypertension (see #3 below).  I suspect RV dysfunction is from long-standing pulmonary hypertension.  He was on sildenafil in the past but not on it now. Unable to tolerate currently w/ hypotension. CVP down to 9 today.  Would aim for CVP around 10 with RV failure.  I think we have him as optimized as we can get from a volume standpoint.  - Cutting back dobutamine as above.   - CVVH for volume removal, aim for UF net negative 50 cc/hr today.  Goal to keep CVP around 10.  Think he is  ready to transition back to iHD.   - Pulmonary HTN management as below.  3. Pulmonary hypertension: Pulmonary arterial hypertension based on RHC 10/14 with PA 72/34, PVR 6.21.  He says that he was followed by a PH specialist in the past at [facility] and did 6 minute walks regularly.  He says he was told that he had PH because  he was on dialysis.  He remembers taking sildenafil at some point.  CTA chest this admission did not show PE, there was scarring and pleural plaquing but no definite ILD or sarcoidosis.  - When BP is more stable, would be reasonable to start him on sildenafil 20 tid.  - Would get V/Q scan to look for chronic PEs when he is more stable.  4. ESRD: Patient failed prior renal transplant, was being worked up for another transplant. Significant right-sided volume overload with CVP now 9.  - Think he is ready to transition back to iHD at this point, can cut CVVH UF back to net 50 cc/hr for the time being.  5. Atrial arrhythmias: Patient has history of paroxysmal atrial fibrillation and has a Watchman in place. He has been in rate-controlled AF.  - Continue Amiodarone  200 mg bid for now. Stop if he re-develops bradycardia.  - No systemic anticoagulation with Watchman.  6. Bradycardia: Patient had transient bradycardia in setting of hypotension and mental status changes on 10/13.  Follow telemetry closely for recurrence.  7. CAD: NSTEMI with TnI > 24000 on 10/13.  He had no chest pain.  LHC showed 99% in-stent restenosis in the mid LAD treated with DES.  He had severe OM disease as well, managed medically.  No significant RCA disease.  I cannot explain his failed RV by CAD. Limited echo 10/15 EF 55-60%, RV not well visualized  - Continue ASA 81 and Plavix  75 - Continue atorvastatin  80.  8. ID: Recent ESBL E coli bacteremia, recent TMA right foot for gangrene. He has been hypotensive with low SVR, suspect component of septic/distributive shock. PCT was high even in setting of ESRD (46).  -  linezolid  + Meropenem course completed.  9. Delirium: With visual hallucinations.   ABG did not show hypercarbia, NH3 normal.  ?Related to ongoing infection with delirium.  Started on Precedex, now off.  No further hallucinations this morning and seems clear.   - Per CCM.    CRITICAL CARE Performed by: Ezra Shuck  Total critical care time: 40 minutes  Critical care time was exclusive of separately billable procedures and treating other patients.  Critical care was necessary to treat or prevent imminent or life-threatening deterioration.  Critical care was time spent personally by me on the following activities: development of treatment plan with patient and/or surrogate as well as nursing, discussions with consultants, evaluation of patient's response to treatment, examination of patient, obtaining history from patient or surrogate, ordering and performing treatments and interventions, ordering and review of laboratory studies, ordering and review of radiographic studies, pulse oximetry and re-evaluation of patient's condition.  Ezra Shuck 09/04/2024 9:33 AM

## 2024-09-05 ENCOUNTER — Inpatient Hospital Stay (HOSPITAL_COMMUNITY)

## 2024-09-05 ENCOUNTER — Encounter (HOSPITAL_COMMUNITY): Payer: Self-pay

## 2024-09-05 ENCOUNTER — Other Ambulatory Visit (HOSPITAL_COMMUNITY): Payer: Self-pay

## 2024-09-05 DIAGNOSIS — E876 Hypokalemia: Secondary | ICD-10-CM

## 2024-09-05 DIAGNOSIS — I214 Non-ST elevation (NSTEMI) myocardial infarction: Secondary | ICD-10-CM | POA: Diagnosis not present

## 2024-09-05 LAB — RENAL FUNCTION PANEL
Albumin: 2.4 g/dL — ABNORMAL LOW (ref 3.5–5.0)
Anion gap: 9 (ref 5–15)
BUN: 20 mg/dL (ref 6–20)
CO2: 24 mmol/L (ref 22–32)
Calcium: 7.8 mg/dL — ABNORMAL LOW (ref 8.9–10.3)
Chloride: 103 mmol/L (ref 98–111)
Creatinine, Ser: 2.36 mg/dL — ABNORMAL HIGH (ref 0.61–1.24)
GFR, Estimated: 32 mL/min — ABNORMAL LOW (ref >60)
Glucose, Bld: 119 mg/dL — ABNORMAL HIGH (ref 70–99)
Phosphorus: 2.8 mg/dL (ref 2.5–4.6)
Potassium: 4.1 mmol/L (ref 3.5–5.1)
Sodium: 136 mmol/L (ref 135–145)

## 2024-09-05 LAB — COOXEMETRY PANEL
Carboxyhemoglobin: 2.3 % — ABNORMAL HIGH (ref 0.5–1.5)
Methemoglobin: <0.7 % (ref 0.0–1.5)
O2 Saturation: 71.9 %
Total hemoglobin: 7.8 g/dL — ABNORMAL LOW (ref 12.0–16.0)

## 2024-09-05 LAB — GLUCOSE, CAPILLARY
Glucose-Capillary: 116 mg/dL — ABNORMAL HIGH (ref 70–99)
Glucose-Capillary: 128 mg/dL — ABNORMAL HIGH (ref 70–99)
Glucose-Capillary: 120 mg/dL — ABNORMAL HIGH (ref 70–99)
Glucose-Capillary: 109 mg/dL — ABNORMAL HIGH (ref 70–99)

## 2024-09-05 LAB — CBC
HCT: 28 % — ABNORMAL LOW (ref 39.0–52.0)
Hemoglobin: 8.4 g/dL — ABNORMAL LOW (ref 13.0–17.0)
MCH: 29.7 pg (ref 26.0–34.0)
MCHC: 30 g/dL (ref 30.0–36.0)
MCV: 98.9 fL (ref 80.0–100.0)
Platelets: 225 K/uL (ref 150–400)
RBC: 2.83 MIL/uL — ABNORMAL LOW (ref 4.22–5.81)
RDW: 21.3 % — ABNORMAL HIGH (ref 11.5–15.5)
WBC: 11.4 K/uL — ABNORMAL HIGH (ref 4.0–10.5)
nRBC: 0 % (ref 0.0–0.2)

## 2024-09-05 LAB — MAGNESIUM: Magnesium: 2.5 mg/dL — ABNORMAL HIGH (ref 1.7–2.4)

## 2024-09-05 MED ORDER — TECHNETIUM TO 99M ALBUMIN AGGREGATED
4.0000 | Freq: Once | INTRAVENOUS | Status: AC | PRN
  Administered 2024-09-05: 4.1 via INTRAVENOUS

## 2024-09-05 NOTE — Progress Notes (Signed)
 [name], male   DOB: 1966-06-24, 58 y.o.   MRN: [id number]     Advanced Heart Failure Rounding Note  Cardiologist: None  Chief Complaint: Cardiogenic shock with RV failure post NSTEMI  Subjective:    Continues on DBA 1 + NE 10 + VP 0.03. Co-ox 72%.    On CVVH yesterday (just stopped this morning), I/Os net negative 1187.  CVP 9 today.     He is in atrial fibrillation rate 90s, on po amiodarone .    No further hallucinations, awake/oriented.   Objective:   Weight Range: 91.2 kg Body mass index is 26.53 kg/m.   Vital Signs:   Temp:  [97.2 F (36.2 C)-98.8 F (37.1 C)] 98.8 F (37.1 C) (10/22 0828) Pulse Rate:  [67-109] 98 (10/22 0800) Resp:  [15-35] 24 (10/22 0800) SpO2:  [90 %-100 %] 100 % (10/22 0800) Arterial Line BP: (74-122)/(32-72) 95/44 (10/22 0800) Weight:  [91.2 kg] 91.2 kg (10/22 0500) Last BM Date : 09/04/24  Weight change: Filed Weights   09/03/24 0500 09/04/24 0600 09/05/24 0500  Weight: 92.2 kg 95.7 kg 91.2 kg   Intake/Output:   Intake/Output Summary (Last 24 hours) at 09/05/2024 0925 Last data filed at 09/05/2024 0800 Gross per 24 hour  Intake 952.71 ml  Output 2297 ml  Net -1344.29 ml    Physical Exam   General: NAD Neck: JVP 8 cm, no thyromegaly or thyroid nodule.  Lungs: Clear to auscultation bilaterally with normal respiratory effort. CV: Nondisplaced PMI.  Heart irregular S1/S2, no S3/S4, no murmur.  No peripheral edema.    Abdomen: Soft, nontender, no hepatosplenomegaly, no distention.  Skin: Intact without lesions or rashes.  Neurologic: Alert and oriented x 3.  Psych: Normal affect. Extremities: No clubbing or cyanosis.  HEENT: Normal.   Telemetry   AF in 90s (Personally reviewed)   Labs    CBC Recent Labs    09/04/24 0426 09/05/24 0450  WBC 12.0* 11.4*  HGB 8.6* 8.4*  HCT 28.1* 28.0*  MCV 97.6 98.9  PLT 264 225   Basic Metabolic Panel Recent Labs    89/78/74 0426 09/05/24 0450  NA 134* 136  K  4.2 4.1  CL 101 103  CO2 22 24  GLUCOSE 111* 119*  BUN 18 20  CREATININE 2.40* 2.36*  CALCIUM  7.5* 7.8*  MG 2.4 2.5*  PHOS 2.3* 2.8   Liver Function Tests Recent Labs    09/04/24 0426 09/05/24 0450  ALBUMIN  2.4* 2.4*   No results for input(s): LIPASE, AMYLASE in the last 72 hours.  Cardiac Enzymes No results for input(s): CKTOTAL, CKMB, CKMBINDEX, TROPONINI in the last 72 hours.  BNP: BNP (last 3 results) Recent Labs    08/27/24 0956  BNP 569.7*    ProBNP (last 3 results) No results for input(s): PROBNP in the last 8760 hours.   D-Dimer No results for input(s): DDIMER in the last 72 hours. Hemoglobin A1C No results for input(s): HGBA1C in the last 72 hours.  Fasting Lipid Panel No results for input(s): CHOL, HDL, LDLCALC, TRIG, CHOLHDL, LDLDIRECT in the last 72 hours.  Thyroid Function Tests No results for input(s): TSH, T4TOTAL, T3FREE, THYROIDAB in the last 72 hours.  Invalid input(s): FREET3  Other results:   Imaging    No results found.     Medications:     Scheduled Medications:  (feeding supplement) PROSource Plus  30 mL Oral BID BM   allopurinol  100 mg Oral q AM   amiodarone   200  mg Oral BID   aspirin  EC  81 mg Oral Daily   atorvastatin   80 mg Oral QHS   calcitRIOL   1 mcg Oral Q M,W,F-HD   Chlorhexidine  Gluconate Cloth  6 each Topical Daily   clopidogrel   75 mg Oral Q breakfast   famotidine   10 mg Oral QHS   [name]   Topical BID   heparin   5,000 Units Subcutaneous Q8H   midodrine   20 mg Oral Q8H   multivitamin  1 tablet Oral BID   pantoprazole   40 mg Oral Daily   QUEtiapine  50 mg Oral BID   sodium chloride  flush  3 mL Intravenous Q12H   tamsulosin   0.4 mg Oral QPM    Infusions:  sodium chloride  10 mL/hr at 08/30/24 0700   DOBUTamine 1 mcg/kg/min (09/05/24 0800)   norepinephrine  (LEVOPHED ) Adult infusion 10 mcg/min (09/05/24 0800)   vasopressin  0.03 Units/min (09/05/24  0800)    PRN Medications: sodium chloride , acetaminophen , bisacodyl, heparin , mouth rinse, polyethylene glycol, sodium chloride  flush    Patient Profile   [name] is a 58 y.o. AAM with chronic HFpEF, PAF s/p watchman, pSVT, ESRD on iHD, chronic hypotension on midodrine , hx small AVMs, CAD s/p DES to LAD and LCx, hx DVT and OSA on CPAP. AHF team to see for cardiogenic shock with RV failure, s/p NSTEMI, chronic HFpEF.   Assessment/Plan   1. Shock: Suspect mixed cardiogenic and septic shock with low SVR initially.  DBA added for low co-ox of 49%.  Today, remains on NE at 10 + VP 0.03 + DBA 1. Co-ox 72%. SVR has been low suggesting component of distributive shock. He is also now on midodrine  20 tid.  - Continue dobutamine 1, will aim to stop when off pressors.  - Please wean NE and VP for SBP > 90.   - Continue midodrine  to 20 mg tid.  - He has completed abx.   2. RV failure: Echo showed EF >75% with severe RV dilation and severe RV dysfunction.  RHC 10/13 showed severe pulmonary arterial hypertension with CVP 19 and PCWP 9, significant RV failure.  The RV dysfunction is not explained by coronary disease (no obstructive RCA disease).  He has a history of pulmonary hypertension (see #3 below).  I suspect RV dysfunction is from long-standing pulmonary hypertension.  He was on sildenafil in the past but not on it now. Unable to tolerate currently w/ hypotension. CVP down to 9 today.  Would aim for CVP around 10 with RV failure.  I think we have him as optimized as we can get from a volume standpoint.  - Off CVVH, ?HD tomorrow.  - Will stop dobutamine when off pressors.  3. Pulmonary hypertension: Pulmonary arterial hypertension based on RHC 10/14 with PA 72/34, PVR 6.21.  He says that he was followed by a PH specialist in the past at [facility] and did 6 minute walks regularly.  He says he was told that he had PH because he was on dialysis.  He remembers taking sildenafil at some point.  CTA chest  this admission did not show PE, there was scarring and pleural plaquing but no definite ILD or sarcoidosis.  - When BP is more stable, would be reasonable to start him on sildenafil 20 tid.  - Would get V/Q scan to look for chronic PEs when he is more stable.  4. ESRD: Patient failed prior renal transplant, was being worked up for another transplant. Significant right-sided volume overload with CVP  now 9.  - Off CVVH, probably restart iHD tomorrow.  5. Atrial arrhythmias: Patient has history of paroxysmal atrial fibrillation and has a Watchman in place. He has been in rate-controlled AF.  - Continue Amiodarone  200 mg bid for now. Stop if he re-develops bradycardia.  - No systemic anticoagulation with Watchman.  6. Bradycardia: Patient had transient bradycardia in setting of hypotension and mental status changes on 10/13.  Follow telemetry closely for recurrence.  7. CAD: NSTEMI with TnI > 24000 on 10/13.  He had no chest pain.  LHC showed 99% in-stent restenosis in the mid LAD treated with DES.  He had severe OM disease as well, managed medically.  No significant RCA disease.  I cannot explain his failed RV by CAD. Limited echo 10/15 EF 55-60%, RV not well visualized  - Continue ASA 81 and Plavix  75 - Continue atorvastatin  80.  8. ID: Recent ESBL E coli bacteremia, recent TMA right foot for gangrene. He has been hypotensive with low SVR, suspect component of septic/distributive shock. PCT was high even in setting of ESRD (46).  - linezolid  + Meropenem course completed.  9. Delirium: With visual hallucinations.   ABG did not show hypercarbia, NH3 normal.  ?Related to ongoing infection with delirium.  Started on Precedex, now off.  No further hallucinations this morning and seems clear.   - Per CCM.    CRITICAL CARE Performed by: Ezra Shuck  Total critical care time: 35 minutes  Critical care time was exclusive of separately billable procedures and treating other patients.  Critical care  was necessary to treat or prevent imminent or life-threatening deterioration.  Critical care was time spent personally by me on the following activities: development of treatment plan with patient and/or surrogate as well as nursing, discussions with consultants, evaluation of patient's response to treatment, examination of patient, obtaining history from patient or surrogate, ordering and performing treatments and interventions, ordering and review of laboratory studies, ordering and review of radiographic studies, pulse oximetry and re-evaluation of patient's condition.  Ezra Shuck 09/05/2024 9:25 AM
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
## 2024-09-05 NOTE — Progress Notes (Signed)
 [name], male   DOB: 1966-07-29, 58 y.o.   MRN: [id number] [facility] Progress Note   Assessment/ Plan:   1.  Acute exacerbation of congestive heart failure with cardiogenic shock: Suspected to have associated septic component as well for which he has completed his antibiotic course.  He remains on pressors/midodrine  and inotropic support.  CVP is down to 7 cmH2O and clinically, he appears to be at or slightly below his estimated dry weight.  CRRT will be discontinued at this time and he will be clinically monitored to determine timing to restart intermittent hemodialysis (potentially when off pressors). 2. ESRD: Status post failure of renal allograft following which he was restarted back on dialysis.  He has had successful ultrafiltration/volume being weaned to CRRT which will be discontinued at this time.  I will monitor him/labs over the next 24 to 48 hours to determine appropriate timing to resume intermittent hemodialysis. 3. Anemia: Without overt blood loss, hemoglobin/hematocrit appear stable overnight, continue to monitor with ESA dosing. 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 5.  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  Hyponatremia: Monitor with ultrafiltration/CRRT and limit oral fluid intake.  Subjective:   Without acute events overnight, tolerated CRRT/ultrafiltration with additional net negative fluid balance.   Objective:   BP (!) 76/64 (BP Location: Left Leg)   Pulse 98   Temp 98.2 F (36.8 C) (Axillary)   Resp (!) 24   Ht 6' 0.99 (1.854 m)   Wt 91.2 kg   SpO2 100%   BMI 26.53 kg/m   Physical Exam: Gen: Appears comfortable resting in bed, awake/alert and watching TV CVS: Regular rhythm/normal rate.  S1 and S2 normal.  Temporary right IJ HD catheter Resp: Diminished breath sounds over bases, no distinct rales or rhonchi. Abd: Soft,  flat, nontender, bowel sounds normal Ext: Left RCF with poor outflow thrill and suboptimal augmentation indicative of either inflow cephalic vein stenosis versus accessory vein.  Right leg status post TMA in Ace wrap dressing.  Left leg no edema  Labs: BMET Recent Labs  Lab 09/01/24 1552 09/02/24 0458 09/02/24 1536 09/03/24 0456 09/03/24 1519 09/04/24 0426 09/05/24 0450  NA 135 135 133* 134* 134* 134* 136  K 3.6 3.4* 3.6 3.5 4.0 4.2 4.1  CL 103 100 101 100 99 101 103  CO2 23 22 22 23 25 22 24   GLUCOSE 108* 89 132* 115* 142* 111* 119*  BUN 16 17 18 18 17 18 20   CREATININE 2.37* 2.08* 2.23* 2.15* 2.07* 2.40* 2.36*  CALCIUM  7.5* 7.7* 7.3* 7.4* 7.5* 7.5* 7.8*  PHOS 2.1* 1.5* 2.7 1.9* 2.8 2.3* 2.8   CBC Recent Labs  Lab 09/02/24 0458 09/03/24 0456 09/04/24 0426 09/05/24 0450  WBC 9.6 10.0 12.0* 11.4*  HGB 8.8* 8.7* 8.6* 8.4*  HCT 28.9* 28.5* 28.1* 28.0*  MCV 96.3 96.6 97.6 98.9  PLT 254 263 264 225      Medications:     (feeding supplement) PROSource Plus  30 mL Oral BID BM   allopurinol  100 mg Oral q AM   amiodarone   200 mg Oral BID   aspirin  EC  81 mg Oral Daily   atorvastatin   80 mg Oral QHS   calcitRIOL   1 mcg Oral Q M,W,F-HD   Chlorhexidine  Gluconate Cloth  6 each Topical Daily   clopidogrel   75 mg Oral Q breakfast   famotidine   10 mg Oral QHS  [name]   Topical BID   heparin   5,000 Units Subcutaneous Q8H   midodrine   20 mg Oral Q8H   multivitamin  1 tablet Oral BID   pantoprazole   40 mg Oral Daily   QUEtiapine  50 mg Oral BID   sodium chloride  flush  3 mL Intravenous Q12H   tamsulosin   0.4 mg Oral QPM   Gordy Blanch, MD 09/05/2024, 8:04 AM

## 2024-09-05 NOTE — Progress Notes (Signed)
 Orthopedic Tech Progress Note Patient Details:  [name] [birth year] [id number]  Ortho Devices Type of Ortho Device: CAM walker Ortho Device/Splint Location: REsting hand splint/Right/ Left at bedside for OT to apply. Ortho Device/Splint Interventions: Ordered   Post Interventions Patient Tolerated: Well Instructions Provided: Other (comment)  Adine MARLA Blush 09/05/2024, 9:51 AM

## 2024-09-05 NOTE — Consult Note (Signed)
 WOC Nurse Consult Note: Reason for Consult: r toes amputated, bandage needs changing, ortho recommended WOC consult  Wound type: surgical  Pressure Injury POA: NA-WOC nursing flowing for pressure injuries during this admission [place] to nursing and orthopedics PA regarding the dressing. PA Ollis gave orders to leave dressing in place to the amputation site until Dr. Kit sees the patient  Orders updated.   Oswell Say [facility], CNS, CWON-AP [phone]

## 2024-09-06 LAB — RENAL FUNCTION PANEL
Albumin: 2.5 g/dL — ABNORMAL LOW (ref 3.5–5.0)
Anion gap: 11 (ref 5–15)
BUN: 39 mg/dL — ABNORMAL HIGH (ref 6–20)
CO2: 23 mmol/L (ref 22–32)
Calcium: 8 mg/dL — ABNORMAL LOW (ref 8.9–10.3)
Chloride: 103 mmol/L (ref 98–111)
Creatinine, Ser: 4.38 mg/dL — ABNORMAL HIGH (ref 0.61–1.24)
GFR, Estimated: 15 mL/min — ABNORMAL LOW (ref >60)
Glucose, Bld: 128 mg/dL — ABNORMAL HIGH (ref 70–99)
Phosphorus: 4.5 mg/dL (ref 2.5–4.6)
Potassium: 4.3 mmol/L (ref 3.5–5.1)
Sodium: 137 mmol/L (ref 135–145)

## 2024-09-06 LAB — CBC
HCT: 27.6 % — ABNORMAL LOW (ref 39.0–52.0)
Hemoglobin: 8.6 g/dL — ABNORMAL LOW (ref 13.0–17.0)
MCH: 30.5 pg (ref 26.0–34.0)
MCHC: 31.2 g/dL (ref 30.0–36.0)
MCV: 97.9 fL (ref 80.0–100.0)
Platelets: 233 K/uL (ref 150–400)
RBC: 2.82 MIL/uL — ABNORMAL LOW (ref 4.22–5.81)
RDW: 21.5 % — ABNORMAL HIGH (ref 11.5–15.5)
WBC: 10.8 K/uL — ABNORMAL HIGH (ref 4.0–10.5)
nRBC: 0 % (ref 0.0–0.2)

## 2024-09-06 LAB — COOXEMETRY PANEL
Carboxyhemoglobin: 2.5 % — ABNORMAL HIGH (ref 0.5–1.5)
Methemoglobin: <0.7 % (ref 0.0–1.5)
O2 Saturation: 72.5 %
Total hemoglobin: 8.9 g/dL — ABNORMAL LOW (ref 12.0–16.0)

## 2024-09-06 LAB — GLUCOSE, CAPILLARY
Glucose-Capillary: 141 mg/dL — ABNORMAL HIGH (ref 70–99)
Glucose-Capillary: 99 mg/dL (ref 70–99)
Glucose-Capillary: 105 mg/dL — ABNORMAL HIGH (ref 70–99)
Glucose-Capillary: 98 mg/dL (ref 70–99)
Glucose-Capillary: 127 mg/dL — ABNORMAL HIGH (ref 70–99)

## 2024-09-06 LAB — ZINC: Zinc: 73 ug/dL (ref 44–115)

## 2024-09-06 LAB — VITAMIN C: Vitamin C: <0.1 mg/dL — ABNORMAL LOW (ref 0.4–2.0)

## 2024-09-06 LAB — MAGNESIUM: Magnesium: 2.6 mg/dL — ABNORMAL HIGH (ref 1.7–2.4)

## 2024-09-06 MED ORDER — QUETIAPINE FUMARATE 25 MG PO TABS
25.0000 mg | ORAL_TABLET | Freq: Two times a day (BID) | ORAL | Status: DC
  Administered 2024-09-06 – 2024-09-07 (×2): 25 mg via ORAL
  Filled 2024-09-06 (×3): qty 1
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
## 2024-09-06 NOTE — Progress Notes (Cosign Needed)
 [name], male   DOB: 02/07/1966, 58 y.o.   MRN: [id number]     Advanced Heart Failure Rounding Note  Cardiologist: None  Chief Complaint: Cardiogenic shock with RV failure post NSTEMI  Subjective:    CRRT stopped yesterday.   Continues on DBA 1. Co-ox 73%   Now off NE. Remains on VP 0.01.   CVP 8   Feels well. Denies dyspnea. No chest pain.   No further hallucinations, awake/oriented.   Objective:   Weight Range: 94 kg Body mass index is 27.35 kg/m.   Vital Signs:   Temp:  [98.5 F (36.9 C)] 98.5 F (36.9 C) (10/23 0748) Pulse Rate:  [66-135] 100 (10/23 0815) Resp:  [10-32] 25 (10/23 0945) BP: (89-96)/(28-75) 96/28 (10/23 0945) SpO2:  [84 %-100 %] 100 % (10/23 0615) Arterial Line BP: (81-204)/(37-184) 99/44 (10/23 0945) Weight:  [94 kg] 94 kg (10/23 0500) Last BM Date : 09/05/24  Weight change: Filed Weights   09/04/24 0600 09/05/24 0500 09/06/24 0500  Weight: 95.7 kg 91.2 kg 94 kg   Intake/Output:   Intake/Output Summary (Last 24 hours) at 09/06/2024 1106 Last data filed at 09/06/2024 0900 Gross per 24 hour  Intake 1228.94 ml  Output 500 ml  Net 728.94 ml    Physical Exam   GENERAL: NAD Lungs- clear CARDIAC:  JVP: 8 cm         Irregularly irregular rhythm and rate. No MRG, no LEE  ABDOMEN: Soft, non-tender, non-distended.  EXTREMITIES: Warm and well perfused. S/p R TMTA  NEUROLOGIC: No obvious FND   Telemetry   Afib low 100s (Personally reviewed)   Labs    CBC Recent Labs    09/05/24 0450 09/06/24 0442  WBC 11.4* 10.8*  HGB 8.4* 8.6*  HCT 28.0* 27.6*  MCV 98.9 97.9  PLT 225 233   Basic Metabolic Panel Recent Labs    89/77/74 0450 09/06/24 0442  NA 136 137  K 4.1 4.3  CL 103 103  CO2 24 23  GLUCOSE 119* 128*  BUN 20 39*  CREATININE 2.36* 4.38*  CALCIUM  7.8* 8.0*  MG 2.5* 2.6*  PHOS 2.8 4.5   Liver Function Tests Recent Labs    09/05/24 0450 09/06/24 0442  ALBUMIN  2.4* 2.5*   No results for  input(s): LIPASE, AMYLASE in the last 72 hours.  Cardiac Enzymes No results for input(s): CKTOTAL, CKMB, CKMBINDEX, TROPONINI in the last 72 hours.  BNP: BNP (last 3 results) Recent Labs    08/27/24 0956  BNP 569.7*    ProBNP (last 3 results) No results for input(s): PROBNP in the last 8760 hours.   D-Dimer No results for input(s): DDIMER in the last 72 hours. Hemoglobin A1C No results for input(s): HGBA1C in the last 72 hours.  Fasting Lipid Panel No results for input(s): CHOL, HDL, LDLCALC, TRIG, CHOLHDL, LDLDIRECT in the last 72 hours.  Thyroid Function Tests No results for input(s): TSH, T4TOTAL, T3FREE, THYROIDAB in the last 72 hours.  Invalid input(s): FREET3  Other results:   Imaging    NM Pulmonary Perfusion Result Date: 09/05/2024 EXAM: NM Lung Perfusion Scan. CLINICAL HISTORY: Pulmonary embolism (PE) suspected, low to intermediate probability, negative D-dimer. TECHNIQUE: 4.1 mCi Tc19m MAA was administered intravenously via existing IV @1500  krt and planar images of the lungs were obtained in multiple projections. RADIOPHARMACEUTICAL: 4.1 mCi Tc24m MAA. COMPARISON: Chest x-ray and CTA. FINDINGS: PERFUSION: Decreased perfusion to the left and right lung bases, which is in a regional distribution and corresponds  to atelectasis and effusions on comparison chest x-ray as well as elevated hemidiaphragms. More focal perfusion defect in the posterior right lower lobe corresponds to potential airspace disease or scarring on comparison chest radiograph and CTA. No convincing evidence of acute or chronic pulmonary embolism. IMPRESSION: 1. No convincing evidence of acute or chronic pulmonary embolism. 2. Decreased perfusion at the lung bases corresponding to atelectasis and effusions on comparison chest radiograph. 3. Focal posterior right lower lobe perfusion defect corresponding to potential airspace disease or scarring on comparison  chest radiograph and CTA. Electronically signed by: Norleen Boxer MD 09/05/2024 04:12 PM EDT RP Workstation: [id number]       Medications:     Scheduled Medications:  (feeding supplement) PROSource Plus  30 mL Oral BID BM   allopurinol  100 mg Oral q AM   amiodarone   200 mg Oral BID   aspirin  EC  81 mg Oral Daily   atorvastatin   80 mg Oral QHS   calcitRIOL   1 mcg Oral Q M,W,F-HD   Chlorhexidine  Gluconate Cloth  6 each Topical Daily   clopidogrel   75 mg Oral Q breakfast   famotidine   10 mg Oral QHS   [name]   Topical BID   heparin   5,000 Units Subcutaneous Q8H   midodrine   20 mg Oral Q8H   multivitamin  1 tablet Oral BID   pantoprazole   40 mg Oral Daily   QUEtiapine  25 mg Oral BID   sodium chloride  flush  3 mL Intravenous Q12H   tamsulosin   0.4 mg Oral QPM    Infusions:  sodium chloride  10 mL/hr at 08/30/24 0700   DOBUTamine 1 mcg/kg/min (09/06/24 0900)   norepinephrine  (LEVOPHED ) Adult infusion 4 mcg/min (09/06/24 0900)   vasopressin  0.02 Units/min (09/06/24 0953)    PRN Medications: sodium chloride , acetaminophen , bisacodyl, heparin , mouth rinse, polyethylene glycol, sodium chloride  flush    Patient Profile   [name] is a 58 y.o. AAM with chronic HFpEF, PAF s/p watchman, pSVT, ESRD on iHD, chronic hypotension on midodrine , hx small AVMs, CAD s/p DES to LAD and LCx, hx DVT and OSA on CPAP. AHF team to see for cardiogenic shock with RV failure, s/p NSTEMI, chronic HFpEF.   Assessment/Plan   1. Shock: Suspect mixed cardiogenic and septic shock with low SVR initially.  DBA added for low co-ox of 49%.  Today, remains on NE at 10 + VP 0.03 + DBA 1. Co-ox 72%. SVR has been low suggesting component of distributive shock. He is also now on midodrine  20 tid. Now off NE. Remains on DBA 1 + VP.  - Continue dobutamine 1, will aim to stop when off pressors. Hopefully can stop later today - Please wean VP for SBP > 90.   - Continue midodrine  to 20 mg tid -  He has completed abx.   2. RV failure: Echo showed EF >75% with severe RV dilation and severe RV dysfunction.  RHC 10/13 showed severe pulmonary arterial hypertension with CVP 19 and PCWP 9, significant RV failure.  The RV dysfunction is not explained by coronary disease (no obstructive RCA disease).  He has a history of pulmonary hypertension (see #3 below).  I suspect RV dysfunction is from long-standing pulmonary hypertension.  He was on sildenafil in the past but not on it now. Volume optimized w/ CRRT. CRRT stopped yesterday, CVP 8 today.  Co-ox 73% on DBA 1  - plan next iHD tomorrow  - Will stop dobutamine when off pressors.  3. Pulmonary  hypertension: Pulmonary arterial hypertension based on RHC 10/14 with PA 72/34, PVR 6.21.  He says that he was followed by a PH specialist in the past at [facility] and did 6 minute walks regularly.  He says he was told that he had PH because he was on dialysis.  He remembers taking sildenafil at some point.  CTA chest this admission did not show PE, there was scarring and pleural plaquing but no definite ILD or sarcoidosis.  - When BP is more stable, would be reasonable to start him on sildenafil 20 tid.  - Would get V/Q scan to look for chronic PEs when he is more stable.  4. ESRD: Patient failed prior renal transplant, was being worked up for another transplant. Volume improved w/ CRRT. Euvolemic today, CVP 8  - Off CVVH, restart iHD tomorrow.  5. Atrial arrhythmias: Patient has history of paroxysmal atrial fibrillation and has a Watchman in place. He has been in rate-controlled AF.  - Continue Amiodarone  200 mg bid for now. Stop if he re-develops bradycardia.  - No systemic anticoagulation with Watchman.  6. Bradycardia: Patient had transient bradycardia in setting of hypotension and mental status changes on 10/13.  Follow telemetry closely for recurrence.  7. CAD: NSTEMI with TnI > 24000 on 10/13.  He had no chest pain.  LHC showed 99% in-stent restenosis in the  mid LAD treated with DES.  He had severe OM disease as well, managed medically.  No significant RCA disease.  I cannot explain his failed RV by CAD. Limited echo 10/15 EF 55-60%, RV not well visualized  - Continue ASA 81 and Plavix  75 - Continue atorvastatin  80.  8. ID: Recent ESBL E coli bacteremia, recent TMA right foot for gangrene. He has been hypotensive with low SVR, suspect component of septic/distributive shock. PCT was high even in setting of ESRD (46).  - linezolid  + Meropenem course completed.  9. Delirium: With visual hallucinations.   ABG did not show hypercarbia, NH3 normal.  ?Related to ongoing infection with delirium.  Started on Precedex, now off.  No further hallucinations this morning and seems clear.   - Per CCM.   CRITICAL CARE Performed by: Caffie Shed   Total critical care time: 15 minutes  Critical care time was exclusive of separately billable procedures and treating other patients.  Critical care was necessary to treat or prevent imminent or life-threatening deterioration.  Critical care was time spent personally by me on the following activities: development of treatment plan with patient and/or surrogate as well as nursing, discussions with consultants, evaluation of patient's response to treatment, examination of patient, obtaining history from patient or surrogate, ordering and performing treatments and interventions, ordering and review of laboratory studies, ordering and review of radiographic studies, pulse oximetry and re-evaluation of patient's condition.  Caffie Shed, PA-C 09/06/2024   Agree with above. Remains on DBA 1 and VP. NE just restarted for SBP in 60-70 range.   He says he feels much better. Says BP runs low for him. Plan was to start iHD tomorrow  General:  Lying in bed No resp difficulty HEENT: normal Neck: supple. no JVD.  + RIJ HD cath  Cor: PMI nondisplaced. Regular rate & rhythm. No rubs, gallops or murmurs. Lungs:  clear Abdomen: soft, nontender, nondistended. No hepatosplenomegaly. No bruits or masses. Good bowel sounds. Extremities: no cyanosis, clubbing, rash, edema RUE AVF RFA arterial line Neuro: alert & orientedx3, cranial nerves grossly intact. moves all 4 extremities w/o difficulty. Affect pleasant  Remains hypotensive and pressor dependent despite midodrine  20 tid. Will stop DBA. Slow wean on NE and VP. Patient sas he toelrated SBP of 80 at home.   Continue to hold sildenafil for now.   CRITICAL CARE Performed by: Cherrie Sieving  Total critical care time: 41 minutes  Critical care time was exclusive of separately billable procedures and treating other patients.  Critical care was necessary to treat or prevent imminent or life-threatening deterioration.  Critical care was time spent personally by me (independent of midlevel providers or residents) on the following activities: development of treatment plan with patient and/or surrogate as well as nursing, discussions with consultants, evaluation of patient's response to treatment, examination of patient, obtaining history from patient or surrogate, ordering and performing treatments and interventions, ordering and review of laboratory studies, ordering and review of radiographic studies, pulse oximetry and re-evaluation of patient's condition.  Sieving Cherrie, MD  8:39 AM

## 2024-09-06 NOTE — TOC Progression Note (Signed)
 Transition of Care [facility]) - Progression Note    Patient Details  Name: [name] MRN: [id number] Date of Birth: 08-24-1966  [facility]) CM/SW Contact  Justina Delcia Czar, RN Phone Number: [phone] 09/06/2024, 4:37 PM  Clinical Narrative:     Patient's wife sent STD paperwork. Inpatient CM gave paperwork to AHF RN, Heather to complete. Updated pt that paperwork was submitted to provider to complete.   Will continue to follow for dc needs.   Expected Discharge Plan: Home w Home Health Services Barriers to Discharge: Continued Medical Work up    Expected Discharge Plan and Services   Discharge Planning Services: CM Consult   Living arrangements for the past 2 months: Single Family Home                     Social Drivers of Health (SDOH) Interventions SDOH Screenings   Food Insecurity: No Food Insecurity (08/25/2024)  Housing: Low Risk  (08/25/2024)  Transportation Needs: No Transportation Needs (08/25/2024)  Utilities: Not At Risk (08/25/2024)  Depression (PHQ2-9): Low Risk  (08/25/2023)  Tobacco Use: Low Risk  (09/05/2024)    Readmission Risk Interventions    08/17/2024    3:30 PM 08/17/2024   10:25 AM 01/25/2023   11:36 AM  Readmission Risk Prevention Plan  Transportation Screening Complete Complete Complete  HRI or Home Care Consult Complete Complete   Social Work Consult for Recovery Care Planning/Counseling Complete Complete   Palliative Care Screening Not Applicable Not Applicable   Medication Review Oceanographer) Complete Complete Complete  PCP or Specialist appointment within 3-5 days of discharge   Complete  HRI or Home Care Consult   Complete  SW Recovery Care/Counseling Consult   Complete  Palliative Care Screening   Not Applicable  Skilled Nursing Facility   Not Applicable

## 2024-09-06 NOTE — Progress Notes (Signed)
 Subjective: [name] is 12 days post op from R transmet amputation.  He denies any pain in the right foot.  His wife is on speaker phone.  Objective: Vital signs in last 24 hours: Temp:  [98.5 F (36.9 C)] 98.5 F (36.9 C) (10/23 0748) Pulse Rate:  [66-135] 100 (10/23 0815) Resp:  [10-32] 25 (10/23 0945) BP: (89-96)/(28-75) 96/28 (10/23 0945) SpO2:  [84 %-100 %] 100 % (10/23 0615) Arterial Line BP: (81-204)/(37-184) 99/44 (10/23 0945) Weight:  [94 kg] 94 kg (10/23 0500)  Intake/Output from previous day: 10/22 0701 - 10/23 0700 In: 1278 [P.O.:840; I.V.:438] Out: 570 [Stool:500] Intake/Output this shift: Total I/O In: 270.4 [P.O.:240; I.V.:30.4] Out: -   Recent Labs    09/04/24 0426 09/05/24 0450 09/06/24 0442  HGB 8.6* 8.4* 8.6*   Recent Labs    09/05/24 0450 09/06/24 0442  WBC 11.4* 10.8*  RBC 2.83* 2.82*  HCT 28.0* 27.6*  PLT 225 233   Recent Labs    09/05/24 0450 09/06/24 0442  NA 136 137  K 4.1 4.3  CL 103 103  CO2 24 23  BUN 20 39*  CREATININE 2.36* 4.38*  GLUCOSE 119* 128*  CALCIUM  7.8* 8.0*   No results for input(s): LABPT, INR in the last 72 hours.  PE:  R foot with healing incision at the distal stump.  Sutures in place.  No signs of infection.  No bleeding or drainage.   Assessment/Plan: His foot is healing appropriately.  He is safe to bear weight as tolerated in the cam boot.  He can have the foot cleaned with Vashe daily when the left foot wound is being cleaned.  Change the dressing daily at that time.  F/u with me in the office in two weeks.  He and his wife understand the plan and agree.       Norleen Armor 09/06/2024, 11:39 AM

## 2024-09-06 NOTE — Progress Notes (Signed)
 [name], male   DOB: 1966-09-01, 58 y.o.   MRN: [id number] [facility] Progress Note   Assessment/ Plan:   1.  Acute exacerbation of congestive heart failure with cardiogenic shock: Suspected to have associated septic component as well for which he has completed his antibiotic course.  CRRT was discontinued yesterday after he was determined to be at/slightly below his estimated dry weight.  He has been stable overnight with ability to wean pressors. 2. ESRD: Status post failure of renal allograft following which he was restarted back on dialysis.  Transiently on CRRT for volume unloading which was discontinued yesterday (10/22) with the intention of undertaking hemodialysis when deemed more hemodynamically stable.  Currently on high-dose midodrine  20 mg 3 times a day.  He reports that his typical blood pressures are in the 90-100s over 50s-60s and he remains on ultrafiltration with hemodialysis even with systolics as low as the 70s (as long as asymptomatic).  Will plan for next dialysis tentatively tomorrow. 3. Anemia: Without overt blood loss, hemoglobin/hematocrit appear stable overnight, continue to monitor with ESA dosing. 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  Corrected calcium  and phosphorus are currently at goal and I will restart his binder as phosphorus begins to trend up (was stopped because he became hypophosphatemic while on CRRT). 5.  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  Seen by wound care and follow-up plans reported with orthopedic surgery (per patient).  Subjective:   Without acute events overnight, inquires about ability to sit in recliner/ambulate.   Objective:   BP (!) 76/64 (BP Location: Left Leg)   Pulse 69   Temp 98.5 F (36.9 C) (Oral)   Resp (!) 21   Ht 6' 0.99 (1.854 m)   Wt 94 kg   SpO2 100%   BMI 27.35 kg/m   Physical Exam: Gen: Comfortably resting in bed, watching television CVS:  Regular rhythm/normal rate.  S1 and S2 normal.  Temporary right IJ HD catheter Resp: Diminished breath sounds over bases, no distinct rales or rhonchi. Abd: Soft, flat, nontender, bowel sounds normal Ext: Left RCF with poor outflow thrill and suboptimal augmentation indicative of either inflow cephalic vein stenosis versus accessory vein.  Right leg status post TMA in Ace wrap dressing.  Left leg no edema  Labs: BMET Recent Labs  Lab 09/02/24 0458 09/02/24 1536 09/03/24 0456 09/03/24 1519 09/04/24 0426 09/05/24 0450 09/06/24 0442  NA 135 133* 134* 134* 134* 136 137  K 3.4* 3.6 3.5 4.0 4.2 4.1 4.3  CL 100 101 100 99 101 103 103  CO2 22 22 23 25 22 24 23   GLUCOSE 89 132* 115* 142* 111* 119* 128*  BUN 17 18 18 17 18 20  39*  CREATININE 2.08* 2.23* 2.15* 2.07* 2.40* 2.36* 4.38*  CALCIUM  7.7* 7.3* 7.4* 7.5* 7.5* 7.8* 8.0*  PHOS 1.5* 2.7 1.9* 2.8 2.3* 2.8 4.5   CBC Recent Labs  Lab 09/03/24 0456 09/04/24 0426 09/05/24 0450 09/06/24 0442  WBC 10.0 12.0* 11.4* 10.8*  HGB 8.7* 8.6* 8.4* 8.6*  HCT 28.5* 28.1* 28.0* 27.6*  MCV 96.6 97.6 98.9 97.9  PLT 263 264 225 233      Medications:     (feeding supplement) PROSource Plus  30 mL Oral BID BM   allopurinol  100 mg Oral q AM   amiodarone   200 mg Oral BID   aspirin  EC  81 mg Oral Daily   atorvastatin   80 mg Oral QHS   calcitRIOL   1 mcg Oral Q M,W,F-HD   Chlorhexidine  Gluconate Cloth  6 each Topical Daily   clopidogrel   75 mg Oral Q breakfast   famotidine   10 mg Oral QHS   [name]   Topical BID   heparin   5,000 Units Subcutaneous Q8H   midodrine   20 mg Oral Q8H   multivitamin  1 tablet Oral BID   pantoprazole   40 mg Oral Daily   QUEtiapine  50 mg Oral BID   sodium chloride  flush  3 mL Intravenous Q12H   tamsulosin   0.4 mg Oral QPM   Gordy Blanch, MD 09/06/2024, 8:01 AM

## 2024-09-07 ENCOUNTER — Ambulatory Visit (HOSPITAL_BASED_OUTPATIENT_CLINIC_OR_DEPARTMENT_OTHER): Admitting: Internal Medicine

## 2024-09-07 ENCOUNTER — Other Ambulatory Visit (HOSPITAL_COMMUNITY): Payer: Self-pay

## 2024-09-07 DIAGNOSIS — E46 Unspecified protein-calorie malnutrition: Secondary | ICD-10-CM

## 2024-09-07 LAB — RENAL FUNCTION PANEL
Albumin: 2.3 g/dL — ABNORMAL LOW (ref 3.5–5.0)
Anion gap: 12 (ref 5–15)
BUN: 55 mg/dL — ABNORMAL HIGH (ref 6–20)
CO2: 21 mmol/L — ABNORMAL LOW (ref 22–32)
Calcium: 7.7 mg/dL — ABNORMAL LOW (ref 8.9–10.3)
Chloride: 103 mmol/L (ref 98–111)
Creatinine, Ser: 6.81 mg/dL — ABNORMAL HIGH (ref 0.61–1.24)
GFR, Estimated: 9 mL/min — ABNORMAL LOW (ref >60)
Glucose, Bld: 97 mg/dL (ref 70–99)
Phosphorus: 5.3 mg/dL — ABNORMAL HIGH (ref 2.5–4.6)
Potassium: 4.5 mmol/L (ref 3.5–5.1)
Sodium: 136 mmol/L (ref 135–145)

## 2024-09-07 LAB — GLUCOSE, CAPILLARY
Glucose-Capillary: 104 mg/dL — ABNORMAL HIGH (ref 70–99)
Glucose-Capillary: 104 mg/dL — ABNORMAL HIGH (ref 70–99)
Glucose-Capillary: 92 mg/dL (ref 70–99)
Glucose-Capillary: 84 mg/dL (ref 70–99)
Glucose-Capillary: 94 mg/dL (ref 70–99)

## 2024-09-07 LAB — COOXEMETRY PANEL
Carboxyhemoglobin: 1.9 % — ABNORMAL HIGH (ref 0.5–1.5)
Methemoglobin: <0.7 % (ref 0.0–1.5)
O2 Saturation: 77.3 %
Total hemoglobin: 7.1 g/dL — ABNORMAL LOW (ref 12.0–16.0)

## 2024-09-07 LAB — CBC
HCT: 26.4 % — ABNORMAL LOW (ref 39.0–52.0)
Hemoglobin: 8.1 g/dL — ABNORMAL LOW (ref 13.0–17.0)
MCH: 29.8 pg (ref 26.0–34.0)
MCHC: 30.7 g/dL (ref 30.0–36.0)
MCV: 97.1 fL (ref 80.0–100.0)
Platelets: 207 K/uL (ref 150–400)
RBC: 2.72 MIL/uL — ABNORMAL LOW (ref 4.22–5.81)
RDW: 22 % — ABNORMAL HIGH (ref 11.5–15.5)
WBC: 10.4 K/uL (ref 4.0–10.5)
nRBC: 0 % (ref 0.0–0.2)

## 2024-09-07 LAB — MAGNESIUM: Magnesium: 2.4 mg/dL (ref 1.7–2.4)

## 2024-09-07 MED ORDER — FERRIC CITRATE 1 GM 210 MG(FE) PO TABS
420.0000 mg | ORAL_TABLET | Freq: Three times a day (TID) | ORAL | Status: DC
  Administered 2024-09-07 – 2024-09-13 (×18): 420 mg via ORAL
  Filled 2024-09-07 (×21): qty 2

## 2024-09-07 MED ORDER — DEXTROSE 50 % IV SOLN
INTRAVENOUS | Status: AC
  Filled 2024-09-07: qty 50

## 2024-09-07 MED ORDER — MIDODRINE HCL 5 MG PO TABS
10.0000 mg | ORAL_TABLET | ORAL | Status: DC | PRN
  Administered 2024-09-07 – 2024-09-13 (×9): 10 mg via ORAL
  Filled 2024-09-07 (×6): qty 2

## 2024-09-07 NOTE — Progress Notes (Addendum)
 Advanced Heart Failure Rounding Note  Cardiologist: None  Chief Complaint: Hypotension Subjective:    iHD today. Remains on NE off/on 1-5. VP stopped. Co-ox 77 off DBA. CVP 8. iHD today.  Sitting up in bed. Feeling well. No SOB, CP.   Objective:    Weight Range: 93.5 kg Body mass index is 27.2 kg/m.   Vital Signs:   Temp:  [97.7 F (36.5 C)-98.4 F (36.9 C)] 97.7 F (36.5 C) (10/24 0815) Pulse Rate:  [64-113] 101 (10/24 1100) Resp:  [12-36] 25 (10/24 1100) BP: (52-158)/(28-121) 93/67 (10/24 1100) SpO2:  [88 %-100 %] 98 % (10/24 1100) Arterial Line BP: (69-131)/(30-72) 83/39 (10/24 1000) Weight:  [93.5 kg] 93.5 kg (10/24 0500) Last BM Date : 09/06/24  Weight change: Filed Weights   09/05/24 0500 09/06/24 0500 09/07/24 0500  Weight: 91.2 kg 94 kg 93.5 kg   Intake/Output:  Intake/Output Summary (Last 24 hours) at 09/07/2024 1108 Last data filed at 09/07/2024 1000 Gross per 24 hour  Intake 228.11 ml  Output --  Net 228.11 ml    Physical Exam   General: No distress on RA Cardiac: S1 and S2 present. No murmurs Extremities: Warm and dry.  TraceBLE edema.  Neuro: Alert and oriented x3. Affect pleasant.   Telemetry   AF 70-90s (personally reviewed)  Labs    CBC Recent Labs    09/06/24 0442 09/07/24 0513  WBC 10.8* 10.4  HGB 8.6* 8.1*  HCT 27.6* 26.4*  MCV 97.9 97.1  PLT 233 207   Basic Metabolic Panel Recent Labs    89/76/74 0442 09/07/24 0513  NA 137 136  K 4.3 4.5  CL 103 103  CO2 23 21*  GLUCOSE 128* 97  BUN 39* 55*  CREATININE 4.38* 6.81*  CALCIUM  8.0* 7.7*  MG 2.6* 2.4  PHOS 4.5 5.3*   Liver Function Tests Recent Labs    09/06/24 0442 09/07/24 0513  ALBUMIN  2.5* 2.3*   BNP (last 3 results) Recent Labs    08/27/24 0956  BNP 569.7*   Medications:    Scheduled Medications:  (feeding supplement) PROSource Plus  30 mL Oral BID BM   allopurinol  100 mg Oral q AM   amiodarone   200 mg Oral BID   aspirin  EC  81 mg Oral  Daily   atorvastatin   80 mg Oral QHS   calcitRIOL   1 mcg Oral Q M,W,F-HD   Chlorhexidine  Gluconate Cloth  6 each Topical Daily   clopidogrel   75 mg Oral Q breakfast   famotidine   10 mg Oral QHS   ferric citrate   420 mg Oral TID WC   [name]   Topical BID   heparin   5,000 Units Subcutaneous Q8H   midodrine   20 mg Oral Q8H   multivitamin  1 tablet Oral BID   pantoprazole   40 mg Oral Daily   QUEtiapine  25 mg Oral BID   sodium chloride  flush  3 mL Intravenous Q12H   tamsulosin   0.4 mg Oral QPM    Infusions:  sodium chloride  10 mL/hr at 08/30/24 0700   norepinephrine  (LEVOPHED ) Adult infusion 1 mcg/min (09/07/24 1000)    PRN Medications: sodium chloride , acetaminophen , bisacodyl, heparin , midodrine , mouth rinse, polyethylene glycol, sodium chloride  flush  Patient Profile   Marjorie Lussier is a 58 y.o. AAM with chronic HFpEF, PAF s/p watchman, pSVT, ESRD on iHD, chronic hypotension on midodrine , hx small AVMs, CAD s/p DES to LAD and LCx, hx DVT and OSA on CPAP. AHF team  to see for cardiogenic shock with RV failure, s/p NSTEMI, chronic HFpEF.   Assessment/Plan   1. Shock: Suspect mixed cardiogenic and septic shock with low SVR initially.  DBA added for low co-ox of 49%.  Today, remains on NE at 10 + VP 0.03 + DBA 1. Co-ox 72%. SVR has been low suggesting component of distributive shock. He is also now on midodrine  20 tid. On NE 1-5, weaning. VP discontinued. - Co-ox stable 77 off milrinone. - Continue midodrine  to 20 mg tid - He has completed abx.   2. RV failure: Echo showed EF >75% with severe RV dilation and severe RV dysfunction.  RHC 10/13 showed severe pulmonary arterial hypertension with CVP 19 and PCWP 9, significant RV failure.  The RV dysfunction is not explained by coronary disease (no obstructive RCA disease).  He has a history of pulmonary hypertension (see #3 below).  I suspect RV dysfunction is from long-standing pulmonary hypertension.  He was on sildenafil  in the past but not on it now. Volume optimized w/ CRRT. CRRT stopped yesterday, CVP 8 again today. - on iHD today, tolerating 3. Pulmonary hypertension: Pulmonary arterial hypertension based on RHC 10/14 with PA 72/34, PVR 6.21.  He says that he was followed by a PH specialist in the past at [facility] and did 6 minute walks regularly.  He says he was told that he had PH because he was on dialysis.  He remembers taking sildenafil at some point.  CTA chest this admission did not show PE, there was scarring and pleural plaquing but no definite ILD or sarcoidosis.  - Unlikely that he will be able to restart sildenafil with midodrine  20 mg tid - V/Q scan 10/22 without evidence of acute or chronic PE.  4. ESRD: Patient failed prior renal transplant, was being worked up for another transplant. Volume improved w/ CRRT. Euvolemic today, CVP 8  - volume removal with iHD. 5. Atrial arrhythmias: Patient has history of paroxysmal atrial fibrillation and has a Watchman in place. He has been in rate-controlled AF.  - continue amiodarone  200 mg bid for now. Stop if he re-develops bradycardia.  - No systemic anticoagulation with Watchman.  6. Bradycardia: Patient had transient bradycardia in setting of hypotension and mental status changes on 10/13.  Follow telemetry closely for recurrence. Some very short pauses overnight. 7. CAD: NSTEMI with TnI > 24000 on 10/13.  He had no chest pain.  LHC showed 99% in-stent restenosis in the mid LAD treated with DES.  He had severe OM disease as well, managed medically.  No significant RCA disease. Cannot explain his failed RV by CAD. Limited echo 10/15 EF 55-60%, RV not well visualized  - Continue ASA 81 and Plavix  75 daily - Continue atorvastatin  80 mg daily 8. ID: Recent ESBL E coli bacteremia, recent TMA right foot for gangrene. He has been hypotensive with low SVR, suspect component of septic/distributive shock. PCT was high even in setting of ESRD (46).  - linezolid  +  meropenem course completed.  9. Delirium: With visual hallucinations.   ABG did not show hypercarbia, NH3 normal.  ?Related to ongoing infection with delirium.  Started on Precedex, now off.  No further hallucinations this morning and seems clear.   - Per CCM.   [place] Lee, NP 09/07/24  Advanced Heart Failure Team Pager [phone] (M-F; 7a - 5p)  Please contact  Cardiology for night-coverage after hours (4p -7a ) and weekends on [email or web address]  Agree with above. We just stopped pressors this  am. SBP holding on midodrine  20 Co-ox 77%  Tolerated iHD with 1 L off.   Denies CP or SOB   General:  Sitting up in bed  No resp difficulty HEENT: normal Neck: supple. RIJ HD cath  Carotids 2+ bilat; no bruits. No lymphadenopathy or thryomegaly appreciated. Cor:  Regular rate & rhythm. No rubs, gallops or murmurs. Lungs: clear Abdomen: soft, nontender, nondistended. No hepatosplenomegaly. No bruits or masses. Good bowel sounds. Extremities: no cyanosis, clubbing, rash, edema RFA arterial line Neuro: alert & orientedx3, cranial nerves grossly intact. moves all 4 extremities w/o difficulty. Affect pleasant  Now of pressors as of this am. Follow closely. Will not restart sildenafil yet. Can remove art line. Continue midodrine .   CRITICAL CARE Performed by: Cherrie Sieving  Total critical care time: 35 minutes  Critical care time was exclusive of separately billable procedures and treating other patients.  Critical care was necessary to treat or prevent imminent or life-threatening deterioration.  Critical care was time spent personally by me (independent of midlevel providers or residents) on the following activities: development of treatment plan with patient and/or surrogate as well as nursing, discussions with consultants, evaluation of patient's response to treatment, examination of patient, obtaining history from patient or surrogate, ordering and performing treatments and interventions,  ordering and review of laboratory studies, ordering and review of radiographic studies, pulse oximetry and re-evaluation of patient's condition.  Sieving Cherrie, MD  3:13 PM

## 2024-09-07 NOTE — Procedures (Signed)
 Patient seen on Hemodialysis. BP (!) 92/47   Pulse 97   Temp 97.7 F (36.5 C)   Resp 20   Ht 6' 0.99 (1.854 m)   Wt 93.5 kg   SpO2 100%   BMI 27.20 kg/m   QB 350, UF goal 1L Tolerating treatment without complaints at this time.   Gordy Blanch MD [facility]. Office # [phone] Pager # [phone] 8:27 AM
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
## 2024-09-07 NOTE — Progress Notes (Signed)
 Orthopedic Tech Progress Note Patient Details:  Lucio Litsey 1966/01/11 [id number] Called in [facility] for [facility] Patient ID: Jakeel Starliper, male   DOB: [birth year], 58 y.o.   MRN: [id number]  Efrain DELENA Cos 09/07/2024, 2:10 PM

## 2024-09-07 NOTE — Progress Notes (Signed)
 [name], male   DOB: 16-Jan-1966, 58 y.o.   MRN: [id number] [facility] Progress Note   Assessment/ Plan:   1.  Acute exacerbation of congestive heart failure with cardiogenic shock: Suspected to have associated septic component as well for which he has completed his antibiotic course.  CRRT was discontinued on 10/22 when he was felt to be at/below his EDW.  On regular dialysis today. 2. ESRD: Status post failure of renal allograft following which he was restarted back on dialysis.  Transiently on CRRT for volume unloading which was discontinued yesterday (10/22) with the intention of undertaking hemodialysis when deemed more hemodynamically stable.  Currently on high-dose midodrine  20 mg 3 times a day.  He has chronic asymptomatic hypotension and takes midodrine  10 mg every hour while on dialysis (none on nondialysis days).  Tolerating dialysis at this time with goal 1 L UF. 3. Anemia: Without overt blood loss, hemoglobin/hematocrit appear stable overnight, continue to monitor with ESA dosing. 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  Corrected calcium  and phosphorus are currently at goal and I will restart his binder today with uptrending phosphorus. 5.  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  Seen by wound care and follow-up plans reported with orthopedic surgery (per patient).  Subjective:   No acute events noted overnight with successfully weaning off pressors/inotropes, started on dialysis this morning without problems.   Objective:   BP 91/71   Pulse 75   Temp 97.8 F (36.6 C) (Axillary)   Resp (!) 23   Ht 6' 0.99 (1.854 m)   Wt 93.5 kg   SpO2 92%   BMI 27.20 kg/m   Physical Exam: Gen: Resting comfortably in bed, on hemodialysis CVS: Regular rhythm/normal rate.  S1 and S2 normal.  Temporary right IJ HD catheter connected to dialysis Resp: Diminished breath sounds over bases, no distinct rales or  rhonchi. Abd: Soft, flat, nontender, bowel sounds normal Ext: Left RCF with poor outflow thrill and suboptimal augmentation indicative of either inflow cephalic vein stenosis versus accessory vein.  Right leg status post TMA in Ace wrap dressing.  Left leg no edema  Labs: BMET Recent Labs  Lab 09/02/24 1536 09/03/24 0456 09/03/24 1519 09/04/24 0426 09/05/24 0450 09/06/24 0442 09/07/24 0513  NA 133* 134* 134* 134* 136 137 136  K 3.6 3.5 4.0 4.2 4.1 4.3 4.5  CL 101 100 99 101 103 103 103  CO2 22 23 25 22 24 23  21*  GLUCOSE 132* 115* 142* 111* 119* 128* 97  BUN 18 18 17 18 20  39* 55*  CREATININE 2.23* 2.15* 2.07* 2.40* 2.36* 4.38* 6.81*  CALCIUM  7.3* 7.4* 7.5* 7.5* 7.8* 8.0* 7.7*  PHOS 2.7 1.9* 2.8 2.3* 2.8 4.5 5.3*   CBC Recent Labs  Lab 09/04/24 0426 09/05/24 0450 09/06/24 0442 09/07/24 0513  WBC 12.0* 11.4* 10.8* 10.4  HGB 8.6* 8.4* 8.6* 8.1*  HCT 28.1* 28.0* 27.6* 26.4*  MCV 97.6 98.9 97.9 97.1  PLT 264 225 233 207      Medications:     (feeding supplement) PROSource Plus  30 mL Oral BID BM   allopurinol  100 mg Oral q AM   amiodarone   200 mg Oral BID   aspirin  EC  81 mg Oral Daily   atorvastatin   80 mg Oral QHS   calcitRIOL   1 mcg Oral Q M,W,F-HD   Chlorhexidine  Gluconate Cloth  6 each Topical Daily   clopidogrel   75 mg Oral Q breakfast  famotidine   10 mg Oral QHS   [name]   Topical BID   heparin   5,000 Units Subcutaneous Q8H   midodrine   20 mg Oral Q8H   multivitamin  1 tablet Oral BID   pantoprazole   40 mg Oral Daily   QUEtiapine  25 mg Oral BID   sodium chloride  flush  3 mL Intravenous Q12H   tamsulosin   0.4 mg Oral QPM   Gordy Blanch, MD 09/07/2024, 8:22 AM
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
## 2024-09-07 NOTE — Procedures (Signed)
 HD Note:  Some information was entered later than the data was gathered due to patient care needs. The stated time with the data is accurate.  Patient treatment performed at bedside  Alert and oriented.   Informed consent signed and in chart.   Access used: right internal jugular temporary HD trialysis catheter Access issues: None  Patient BP dropped and UF paused to allow BP to recover.  See flowsheet for details.  Medications given, see MAR.  Patient was asymptomatic when BP was lower than defined limits  TX duration: 3.25 hours  Alert, without acute distress.  Total UF removed: 1000 ml  Hand-off given to patient's nurse.     Avyaan Summer L. Lenon, RN [facility].

## 2024-09-08 LAB — RENAL FUNCTION PANEL
Albumin: 2.2 g/dL — ABNORMAL LOW (ref 3.5–5.0)
Anion gap: 11 (ref 5–15)
BUN: 40 mg/dL — ABNORMAL HIGH (ref 6–20)
CO2: 26 mmol/L (ref 22–32)
Calcium: 7.8 mg/dL — ABNORMAL LOW (ref 8.9–10.3)
Chloride: 98 mmol/L (ref 98–111)
Creatinine, Ser: 5.74 mg/dL — ABNORMAL HIGH (ref 0.61–1.24)
GFR, Estimated: 11 mL/min — ABNORMAL LOW (ref >60)
Glucose, Bld: 93 mg/dL (ref 70–99)
Phosphorus: 4.1 mg/dL (ref 2.5–4.6)
Potassium: 4.2 mmol/L (ref 3.5–5.1)
Sodium: 135 mmol/L (ref 135–145)

## 2024-09-08 LAB — GLUCOSE, CAPILLARY
Glucose-Capillary: 91 mg/dL (ref 70–99)
Glucose-Capillary: 90 mg/dL (ref 70–99)
Glucose-Capillary: 112 mg/dL — ABNORMAL HIGH (ref 70–99)
Glucose-Capillary: 99 mg/dL (ref 70–99)
Glucose-Capillary: 114 mg/dL — ABNORMAL HIGH (ref 70–99)

## 2024-09-08 LAB — MAGNESIUM: Magnesium: 2.1 mg/dL (ref 1.7–2.4)

## 2024-09-08 LAB — COOXEMETRY PANEL
Carboxyhemoglobin: 1.6 % — ABNORMAL HIGH (ref 0.5–1.5)
Methemoglobin: 2.1 % — ABNORMAL HIGH (ref 0.0–1.5)
O2 Saturation: 61.8 %
Total hemoglobin: 9.3 g/dL — ABNORMAL LOW (ref 12.0–16.0)

## 2024-09-08 MED ORDER — HYDROCODONE-ACETAMINOPHEN 5-325 MG PO TABS
1.0000 | ORAL_TABLET | Freq: Four times a day (QID) | ORAL | Status: DC | PRN
  Administered 2024-09-08 – 2024-09-10 (×5): 1 via ORAL
  Filled 2024-09-08 (×5): qty 1

## 2024-09-08 MED ORDER — QUETIAPINE FUMARATE 25 MG PO TABS
25.0000 mg | ORAL_TABLET | Freq: Every day | ORAL | Status: DC
  Administered 2024-09-09: 25 mg via ORAL
  Filled 2024-09-08: qty 1

## 2024-09-08 NOTE — Progress Notes (Signed)
 [name], male   DOB: Aug 22, 1966, 58 y.o.   MRN: [id number] [facility] Progress Note   Assessment/ Plan:   1.  Acute exacerbation of congestive heart failure with cardiogenic shock: Suspected to have associated septic component as well for which he has completed his antibiotic course.  CRRT was discontinued on 10/22 when he was felt to be at/below his EDW.  On regular dialysis today. 2. ESRD: Status post failure of renal allograft following which he was restarted back on dialysis.  Transiently on CRRT for volume unloading which was discontinued on 10/22 and after a day without dialysis, successfully underwent hemodialysis on 10/24 with 1 L ultrafiltration.  He is on high-dose midodrine  as well as intradialytic midodrine  10 mg q. hourly as needed for blood pressure drops. 3. Anemia: Without overt blood loss, hemoglobin/hematocrit appear stable overnight, continue to monitor with ESA dosing. 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  Corrected calcium  and phosphorus are currently at goal and phosphorus binders restarted. 5.  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  Seen by wound care and follow-up plans reported with orthopedic surgery (per patient).  Subjective:   Successfully underwent hemodialysis yesterday with 1 L ultrafiltration.  Weaned off of pressor/inotropic support.   Objective:   BP (!) 84/60   Pulse 77   Temp 98.1 F (36.7 C) (Oral)   Resp 18   Ht 6' 0.99 (1.854 m)   Wt 94.5 kg   SpO2 100%   BMI 27.49 kg/m   Physical Exam: Gen: Resting comfortably in bed, on hemodialysis CVS: Regular rhythm/normal rate.  S1 and S2 normal.  Temporary right IJ HD catheter in place Resp: Diminished breath sounds over bases, no distinct rales or rhonchi. Abd: Soft, flat, nontender, bowel sounds normal Ext: Left RCF with poor outflow thrill and suboptimal augmentation indicative of either inflow cephalic vein stenosis versus  accessory vein.  Right leg status post TMA in Ace wrap dressing.  Left leg no edema  Labs: BMET Recent Labs  Lab 09/03/24 0456 09/03/24 1519 09/04/24 0426 09/05/24 0450 09/06/24 0442 09/07/24 0513 09/08/24 0445  NA 134* 134* 134* 136 137 136 135  K 3.5 4.0 4.2 4.1 4.3 4.5 4.2  CL 100 99 101 103 103 103 98  CO2 23 25 22 24 23  21* 26  GLUCOSE 115* 142* 111* 119* 128* 97 93  BUN 18 17 18 20  39* 55* 40*  CREATININE 2.15* 2.07* 2.40* 2.36* 4.38* 6.81* 5.74*  CALCIUM  7.4* 7.5* 7.5* 7.8* 8.0* 7.7* 7.8*  PHOS 1.9* 2.8 2.3* 2.8 4.5 5.3* 4.1   CBC Recent Labs  Lab 09/04/24 0426 09/05/24 0450 09/06/24 0442 09/07/24 0513  WBC 12.0* 11.4* 10.8* 10.4  HGB 8.6* 8.4* 8.6* 8.1*  HCT 28.1* 28.0* 27.6* 26.4*  MCV 97.6 98.9 97.9 97.1  PLT 264 225 233 207      Medications:     (feeding supplement) PROSource Plus  30 mL Oral BID BM   allopurinol  100 mg Oral q AM   amiodarone   200 mg Oral BID   aspirin  EC  81 mg Oral Daily   atorvastatin   80 mg Oral QHS   calcitRIOL   1 mcg Oral Q M,W,F-HD   Chlorhexidine  Gluconate Cloth  6 each Topical Daily   clopidogrel   75 mg Oral Q breakfast   dextrose        famotidine   10 mg Oral QHS   ferric citrate   420 mg Oral TID WC  [name]   Topical BID   heparin   5,000 Units Subcutaneous Q8H   midodrine   20 mg Oral Q8H   multivitamin  1 tablet Oral BID   pantoprazole   40 mg Oral Daily   QUEtiapine  25 mg Oral BID   sodium chloride  flush  3 mL Intravenous Q12H   Gordy Blanch, MD 09/08/2024, 8:04 AM

## 2024-09-08 NOTE — Progress Notes (Signed)
 Advanced Heart Failure Rounding Note  Cardiologist: None  Chief Complaint: Hypotension Subjective:    Off IV pressors. Remains on midodrine  20. SBP 90-100  Feels good.   Objective:    Weight Range: 94.5 kg Body mass index is 27.49 kg/m.   Vital Signs:   Temp:  [97.1 F (36.2 C)-98.1 F (36.7 C)] 98.1 F (36.7 C) (10/25 1121) Pulse Rate:  [68-108] 99 (10/25 1300) Resp:  [3-30] 24 (10/25 1300) BP: (79-106)/(43-79) 100/49 (10/25 1300) SpO2:  [82 %-100 %] 100 % (10/25 1300) Arterial Line BP: (88)/(37) 88/37 (10/24 1345) Weight:  [94.5 kg] 94.5 kg (10/25 0630) Last BM Date : 09/08/24  Weight change: Filed Weights   09/06/24 0500 09/07/24 0500 09/08/24 0630  Weight: 94 kg 93.5 kg 94.5 kg   Intake/Output:  Intake/Output Summary (Last 24 hours) at 09/08/2024 1336 Last data filed at 09/08/2024 1000 Gross per 24 hour  Intake 240 ml  Output --  Net 240 ml    Physical Exam   General:  Sitting up in bed No resp difficulty HEENT: normal Neck: supple. no JVD.  Cor: PMI nondisplaced. Regular rate & rhythm. No rubs, gallops or murmurs. Lungs: clear Abdomen: soft, nontender, nondistended. No hepatosplenomegaly. No bruits or masses. Good bowel sounds. Extremities: no cyanosis, clubbing, rash, edema + RUE AVF Neuro: alert & orientedx3, cranial nerves grossly intact. moves all 4 extremities w/o difficulty. Affect pleasant  Telemetry   AF 90-100 (personally reviewed)  Labs    CBC Recent Labs    09/06/24 0442 09/07/24 0513  WBC 10.8* 10.4  HGB 8.6* 8.1*  HCT 27.6* 26.4*  MCV 97.9 97.1  PLT 233 207   Basic Metabolic Panel Recent Labs    89/75/74 0513 09/08/24 0445  NA 136 135  K 4.5 4.2  CL 103 98  CO2 21* 26  GLUCOSE 97 93  BUN 55* 40*  CREATININE 6.81* 5.74*  CALCIUM  7.7* 7.8*  MG 2.4 2.1  PHOS 5.3* 4.1   Liver Function Tests Recent Labs    09/07/24 0513 09/08/24 0445  ALBUMIN  2.3* 2.2*   BNP (last 3 results) Recent Labs     08/27/24 0956  BNP 569.7*   Medications:    Scheduled Medications:  (feeding supplement) PROSource Plus  30 mL Oral BID BM   allopurinol  100 mg Oral q AM   amiodarone   200 mg Oral BID   aspirin  EC  81 mg Oral Daily   atorvastatin   80 mg Oral QHS   calcitRIOL   1 mcg Oral Q M,W,F-HD   Chlorhexidine  Gluconate Cloth  6 each Topical Daily   clopidogrel   75 mg Oral Q breakfast   famotidine   10 mg Oral QHS   ferric citrate   420 mg Oral TID WC   [name]   Topical BID   heparin   5,000 Units Subcutaneous Q8H   midodrine   20 mg Oral Q8H   multivitamin  1 tablet Oral BID   pantoprazole   40 mg Oral Daily   QUEtiapine  25 mg Oral BID   sodium chloride  flush  3 mL Intravenous Q12H    Infusions:  sodium chloride  10 mL/hr at 08/30/24 0700    PRN Medications: sodium chloride , acetaminophen , bisacodyl, heparin , HYDROcodone -acetaminophen , midodrine , mouth rinse, polyethylene glycol, sodium chloride  flush  Patient Profile   [name] is a 58 y.o. AAM with chronic HFpEF, PAF s/p watchman, pSVT, ESRD on iHD, chronic hypotension on midodrine , hx small AVMs, CAD s/p DES to LAD and LCx,  hx DVT and OSA on CPAP. AHF team to see for cardiogenic shock with RV failure, s/p NSTEMI, chronic HFpEF.   Assessment/Plan   1. Shock: Suspect mixed cardiogenic and septic shock with low SVR initially.  DBA added for low co-ox of 49%.   - off iv pressors.  - continue midodrine  20 tid 2. RV failure: Echo showed EF >75% with severe RV dilation and severe RV dysfunction.  RHC 10/13 showed severe pulmonary arterial hypertension with CVP 19 and PCWP 9, significant RV failure.  The RV dysfunction is not explained by coronary disease (no obstructive RCA disease).  He has a history of pulmonary hypertension (see #3 below).  I suspect RV dysfunction is from long-standing pulmonary hypertension.  He was on sildenafil in the past but not on it now. Volume optimized w/ CRRT. CRRT stopped - on iHD today,  tolerating - no change 3. Pulmonary hypertension: Pulmonary arterial hypertension based on RHC 10/14 with PA 72/34, PVR 6.21.  He says that he was followed by a PH specialist in the past at [facility] and did 6 minute walks regularly.  He says he was told that he had PH because he was on dialysis.  He remembers taking sildenafil at some point.  CTA chest this admission did not show PE, there was scarring and pleural plaquing but no definite ILD or sarcoidosis.  - Unlikely that he will be able to restart sildenafil with midodrine  20 mg tid - V/Q scan 10/22 without evidence of acute or chronic PE.  4. ESRD: Patient failed prior renal transplant, was being worked up for another transplant. Volume improved w/ CRRT. Euvolemic today, CVP 8  - volume removal with iHD. 5. Atrial arrhythmias: Patient has history of paroxysmal atrial fibrillation and has a Watchman in place. He has been in rate-controlled AF and stable.  - continue drop amio 200 bid- > 200 daily  Wean off as toelrated - No systemic anticoagulation with Watchman.  6. CAD: NSTEMI with TnI > 24000 on 10/13.  He had no chest pain.  LHC showed 99% in-stent restenosis in the mid LAD treated with DES.  He had severe OM disease as well, managed medically.  No significant RCA disease. Cannot explain his failed RV by CAD. Limited echo 10/15 EF 55-60%, RV not well visualized  - No s/s angina - Continue ASA 81 and Plavix  75 daily - Continue atorvastatin  80 mg daily 7. ID: Recent ESBL E coli bacteremia, recent TMA right foot for gangrene. He has been hypotensive with low SVR, suspect component of septic/distributive shock. PCT was high even in setting of ESRD (46).  - linezolid  + meropenem course completed.   BP improved/stable.   AHF team will sign off.   Toribio Fuel, MD 09/08/24
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
## 2024-09-08 NOTE — Plan of Care (Signed)
  Problem: Education: Goal: Knowledge of General Education information will improve Description: Including pain rating scale, medication(s)/side effects and non-pharmacologic comfort measures Outcome: Progressing   Problem: Health Behavior/Discharge Planning: Goal: Ability to manage health-related needs will improve Outcome: Progressing   Problem: Clinical Measurements: Goal: Ability to maintain clinical measurements within normal limits will improve Outcome: Progressing Goal: Will remain free from infection Outcome: Progressing Goal: Diagnostic test results will improve Outcome: Progressing Goal: Respiratory complications will improve Outcome: Progressing Goal: Cardiovascular complication will be avoided Outcome: Progressing   Problem: Activity: Goal: Risk for activity intolerance will decrease Outcome: Progressing   Problem: Elimination: Goal: Will not experience complications related to bowel motility Outcome: Progressing Goal: Will not experience complications related to urinary retention Outcome: Progressing   Problem: Pain Managment: Goal: General experience of comfort will improve and/or be controlled Outcome: Progressing

## 2024-09-08 NOTE — Progress Notes (Signed)
 NAME:  [name], MRN:  [id number], DOB:  06/22/1966, LOS: 11 ADMISSION DATE:  08/25/2024, CONSULTATION DATE:  08/27/24 REFERRING MD:  Dr. Camellia Door, CHIEF COMPLAINT:  bradycardia, hypotension   History of Present Illness:  [name] is a 59 y/o M with a PMH significant for ESRD on iHD (MWF), chronic hypotension on Midodrine , hx of small AVMs, paroxysmal a. Fib s/p watchman not on [facility] who presented to the orthopedic clinic for R foot gangrene with a recent hx of ESBL Bacteremia and sepsis presumed to be due to osteomyelitis now s/p R transmetatarsal amputation on 08/25/2024. T10/13 he developed some confusion, bradycardia, and hypotension requiring atropine administration and vasopressors. The patient was moved to ICU for hemodynamic monitoring and further care   Pertinent  Medical History  As above  Significant Hospital Events: Including procedures, antibiotic start and stop dates in addition to other pertinent events   08/25/2024 admission to the hospital for R transmetatarsal amputation 08/27/2024 admitted to ICU for hypotension and bradycardia, CTA PE negative. TTE with severely enlarged R ventricle, new. NSTEMI with troponin >24K; cardiology started on heparin  gtt and ASA 10/14 underwent cardiac cath and LAD stenting, remain on epinephrine  and norepinephrine  10/15 patient is requiring multiple vasopressor support, started on CRRT.  Serum troponin remain elevated >24k 10/16 remain on vasopressor support with norepinephrine  and vasopressin .  Epinephrine  was titrated off.  Remain on CRRT with net -300 cc. COOX 46%.  Went into A-fib with RVR requiring amiodarone  infusion 10/17 started getting confused and hallucinating, vasopressor requirement is improving, remain on CRRT with net negative fluid balance 10/18 patient was started on Precedex in the setting of agitation and restlessness.  Remained on CRRT with net -2.5 L.  Remain on dobutamine with Coox 50% 10/19 off precedex 10/20 On CRRT,  net neg 4.29 L, Coox 71.4,  10/22 plan to transition to [facility] 10/23 pressor requirement down-trending  10/24 Off dobutamine, on/off levophed     Interim History / Subjective:  On and off pressors- levophed /vasopressin  overnight Per RN staff, asymptomatic during episodes of hypotension  Patient having no complaints  Weaning off levophed  and vaso  Coox 77.3, off dobutamine   Objective   Blood pressure (!) 84/60, pulse 77, temperature 98.1 F (36.7 C), temperature source Oral, resp. rate 18, height 6' 0.99 (1.854 m), weight 94.5 kg, SpO2 100%. CVP:  [0 mmHg-37 mmHg] 36 mmHg      Intake/Output Summary (Last 24 hours) at 09/08/2024 0749 Last data filed at 09/07/2024 1300 Gross per 24 hour  Intake 14.11 ml  Output 1000 ml  Net -985.89 ml   Filed Weights   09/06/24 0500 09/07/24 0500 09/08/24 0630  Weight: 94 kg 93.5 kg 94.5 kg    Examination: General: acute on chronic adult male, sitting up in ICU bed on vent  HEENT: Normocephalic, PERRLA intact, Pink MM, missing teeth  CV: s1,s2, RRR, no MRG, No JVD  pulm: clear, diminished, no distress  Abs: bs active, soft  Extremities: no edema, deformity-right hand/wrist- contracture, moves all extremities on command, right foot amputation- ace wrap, left great toe- nail- black eschar, scab over  Skin: no rash  Neuro: Rass 0, follows commands  GU: deferred  Patient Lines/Drains/Airways Status     Active Line/Drains/Airways     Name Placement date Placement time Site Days   Arterial Line 08/27/24 Right Femoral 08/27/24  1200  Femoral  6   CVC Triple Lumen 08/27/24 Left Femoral 08/27/24  1500  -- 6   Fistula / Graft  Left Forearm Arteriovenous fistula --  --  Forearm  --   Hemodialysis Catheter Right Internal jugular Triple lumen Temporary (Non-Tunneled) 08/29/24  1000  Internal jugular  4   Midline Single Lumen 08/17/24 Right Cephalic 8 cm 0 cm 08/17/24  1541  Cephalic  16   Negative Pressure Wound Therapy Leg Left;Lower 11/30/22   1518  --  642   Negative Pressure Wound Therapy Leg Left;Lower;Anterior 03/22/23  1427  --  530   Wound 08/12/24 2300 Atypical Toe (Comment  which one) Anterior;Right 08/12/24  2300  Toe (Comment  which one)  21   Wound 08/25/24 1330 Other (Comment) Toe (Comment  which one) Anterior;Left 08/25/24  1330  Toe (Comment  which one)  8   Wound 08/29/24 0800 Pressure Injury Buttocks Right Deep Tissue Pressure Injury - Purple or maroon localized area of discolored intact skin or blood-filled blister due to damage of underlying soft tissue from pressure and/or shear. 08/29/24  0800  Buttocks  4   Wound 08/29/24 0800 Pressure Injury Buttocks Left Deep Tissue Pressure Injury - Purple or maroon localized area of discolored intact skin or blood-filled blister due to damage of underlying soft tissue from pressure and/or shear. 08/29/24  0800  Buttocks  4         Resolved Hospital Problem list   Hypoglycemia Symptomatic bradycardia, resolved Lactic acidosis QTc prolongation, resolved Hypokalemia/hypophosphatemia Hypervolemic hyponatremia Acute hyperactive delirium  Assessment & Plan:  58 year old male with ESRD-HD presents with acute on chronic pulmonology with cardiogenic shock   Patient is now off inotrope, and vasopressors (which was initially utilized for RV dysfunction and severe distributive shock ) Case tolerated his first IHD on 10/24 with goal of 1 L, though midodrine  was used a couple of times  This a.m. is doing well, sats well on nasal cannula, tolerating diet, reclined in the chair, SBP in the 80s but MAP> 65 mentating and perfusing well Cardiac index by Fick 2.6/CO 5.7, improved SVR of 884    Assessment and plan Acute on chronic cor pulmonale with cardiogenic shock-improving Severe pulmonary HTN Acute NSTEMI s/p DES-LAD Distributive shock-likely due to sepsis of unclear source-completed ABX therapy-improving A-fib rate controlled ESRD-renal replacement therapy-now on IHD - Acute  hyperactive delirium-resolved Recent history of ESBL E. coli bacteremia and right foot osteomyelitis s/p post forefoot amputation - Completed ABX for 5-days, cultures have been negative will continue to monitor   Plan - Patient is now off inotrope and vasopressors, tolerated his first HD on 10/24 - Will attempt another IHD on 10/26  - Continue DAPT therapy - Advanced heart failure team on board - If still hemodynamically stable will transfer on 10/26 after second IHD session    Labs   CBC: Recent Labs  Lab 09/03/24 0456 09/04/24 0426 09/05/24 0450 09/06/24 0442 09/07/24 0513  WBC 10.0 12.0* 11.4* 10.8* 10.4  HGB 8.7* 8.6* 8.4* 8.6* 8.1*  HCT 28.5* 28.1* 28.0* 27.6* 26.4*  MCV 96.6 97.6 98.9 97.9 97.1  PLT 263 264 225 233 207    Basic Metabolic Panel: Recent Labs  Lab 09/04/24 0426 09/05/24 0450 09/06/24 0442 09/07/24 0513 09/08/24 0445  NA 134* 136 137 136 135  K 4.2 4.1 4.3 4.5 4.2  CL 101 103 103 103 98  CO2 22 24 23  21* 26  GLUCOSE 111* 119* 128* 97 93  BUN 18 20 39* 55* 40*  CREATININE 2.40* 2.36* 4.38* 6.81* 5.74*  CALCIUM  7.5* 7.8* 8.0* 7.7* 7.8*  MG 2.4 2.5* 2.6* 2.4  2.1  PHOS 2.3* 2.8 4.5 5.3* 4.1   GFR: Estimated Creatinine Clearance: 16.2 mL/min (A) (by C-G formula based on SCr of 5.74 mg/dL (H)). Recent Labs  Lab 09/04/24 0426 09/05/24 0450 09/06/24 0442 09/07/24 0513  WBC 12.0* 11.4* 10.8* 10.4    Liver Function Tests: Recent Labs  Lab 09/04/24 0426 09/05/24 0450 09/06/24 0442 09/07/24 0513 09/08/24 0445  ALBUMIN  2.4* 2.4* 2.5* 2.3* 2.2*   No results for input(s): LIPASE, AMYLASE in the last 168 hours.  No results for input(s): AMMONIA in the last 168 hours.   ABG    Component Value Date/Time   PHART 7.439 08/31/2024 0244   PCO2ART 35.1 08/31/2024 0244   PO2ART 73 (L) 08/31/2024 0244   HCO3 23.8 08/31/2024 0244   TCO2 25 08/31/2024 0244   ACIDBASEDEF 14.0 (H) 08/27/2024 1303   O2SAT 61.8 09/08/2024 0453      Coagulation Profile: No results for input(s): INR, PROTIME in the last 168 hours.  Cardiac Enzymes: No results for input(s): CKTOTAL, CKMB, CKMBINDEX, TROPONINI in the last 168 hours.  HbA1C: Hgb A1c MFr Bld  Date/Time Value Ref Range Status  08/30/2024 04:08 AM 5.7 (H) 4.8 - 5.6 % Final    Comment:    (NOTE) Diagnosis of Diabetes The following HbA1c ranges recommended by the American Diabetes Association (ADA) may be used as an aid in the diagnosis of diabetes mellitus.  Hemoglobin             Suggested A1C NGSP%              Diagnosis  <5.7                   Non Diabetic  5.7-6.4                Pre-Diabetic  >6.4                   Diabetic  <7.0                   Glycemic control for                       adults with diabetes.    03/31/2021 05:38 AM 5.6 4.8 - 5.6 % Final    Comment:    (NOTE) Pre diabetes:          5.7%-6.4%  Diabetes:              >6.4%  Glycemic control for   <7.0% adults with diabetes     CBG: Recent Labs  Lab 09/07/24 0744 09/07/24 1136 09/07/24 1638 09/07/24 2212 09/08/24 0739  GLUCAP 104* 92 84 94 91     [name] Drought, MD  [facility] Prefer epic messenger for cross cover needs   My critical care time: 30 minutes  Critical care time was exclusive of separately billable procedures and treating other patients.  Critical care was necessary to treat or prevent imminent or life-threatening deterioration.  Critical care was time spent personally by me on the following activities: development of treatment plan with patient and/or surrogate as well as nursing, discussions with consultants, evaluation of patient's response to treatment, examination of patient, obtaining history from patient or surrogate, ordering and performing treatments and interventions, ordering and review of laboratory studies, ordering and review of radiographic studies, pulse oximetry, re-evaluation of patient's condition  and participation in multidisciplinary rounds.

## 2024-09-09 LAB — RENAL FUNCTION PANEL
Albumin: 2.1 g/dL — ABNORMAL LOW (ref 3.5–5.0)
Anion gap: 12 (ref 5–15)
BUN: 55 mg/dL — ABNORMAL HIGH (ref 6–20)
CO2: 24 mmol/L (ref 22–32)
Calcium: 7.9 mg/dL — ABNORMAL LOW (ref 8.9–10.3)
Chloride: 102 mmol/L (ref 98–111)
Creatinine, Ser: 7.53 mg/dL — ABNORMAL HIGH (ref 0.61–1.24)
GFR, Estimated: 8 mL/min — ABNORMAL LOW (ref >60)
Glucose, Bld: 103 mg/dL — ABNORMAL HIGH (ref 70–99)
Phosphorus: 3.7 mg/dL (ref 2.5–4.6)
Potassium: 4.2 mmol/L (ref 3.5–5.1)
Sodium: 138 mmol/L (ref 135–145)

## 2024-09-09 LAB — COOXEMETRY PANEL
Carboxyhemoglobin: 2.7 % — ABNORMAL HIGH (ref 0.5–1.5)
Methemoglobin: 1.8 % — ABNORMAL HIGH (ref 0.0–1.5)
O2 Saturation: 71 %
Total hemoglobin: 9.2 g/dL — ABNORMAL LOW (ref 12.0–16.0)

## 2024-09-09 LAB — MAGNESIUM: Magnesium: 2.1 mg/dL (ref 1.7–2.4)

## 2024-09-09 LAB — GLUCOSE, CAPILLARY
Glucose-Capillary: 100 mg/dL — ABNORMAL HIGH (ref 70–99)
Glucose-Capillary: 108 mg/dL — ABNORMAL HIGH (ref 70–99)
Glucose-Capillary: 91 mg/dL (ref 70–99)
Glucose-Capillary: 109 mg/dL — ABNORMAL HIGH (ref 70–99)
Glucose-Capillary: 92 mg/dL (ref 70–99)

## 2024-09-09 MED ORDER — AMIODARONE HCL 200 MG PO TABS
200.0000 mg | ORAL_TABLET | Freq: Every day | ORAL | Status: DC
  Administered 2024-09-10 – 2024-09-13 (×4): 200 mg via ORAL
  Filled 2024-09-09 (×4): qty 1
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
## 2024-09-09 NOTE — Progress Notes (Signed)
 NAME:  [name], MRN:  [id number], DOB:  08-23-1966, LOS: 12 ADMISSION DATE:  08/25/2024, CONSULTATION DATE:  08/27/24 REFERRING MD:  Dr. Camellia Door, CHIEF COMPLAINT:  bradycardia, hypotension   History of Present Illness:  [name] is a 58 y/o M with a PMH significant for ESRD on iHD (MWF), chronic hypotension on Midodrine , hx of small AVMs, paroxysmal a. Fib s/p watchman not on [facility] who presented to the orthopedic clinic for R foot gangrene with a recent hx of ESBL Bacteremia and sepsis presumed to be due to osteomyelitis now s/p R transmetatarsal amputation on 08/25/2024. T10/13 he developed some confusion, bradycardia, and hypotension requiring atropine administration and vasopressors. The patient was moved to ICU for hemodynamic monitoring and further care   Pertinent  Medical History  As above  Significant Hospital Events: Including procedures, antibiotic start and stop dates in addition to other pertinent events   08/25/2024 admission to the hospital for R transmetatarsal amputation 08/27/2024 admitted to ICU for hypotension and bradycardia, CTA PE negative. TTE with severely enlarged R ventricle, new. NSTEMI with troponin >24K; cardiology started on heparin  gtt and ASA 10/14 underwent cardiac cath and LAD stenting, remain on epinephrine  and norepinephrine  10/15 patient is requiring multiple vasopressor support, started on CRRT.  Serum troponin remain elevated >24k 10/16 remain on vasopressor support with norepinephrine  and vasopressin .  Epinephrine  was titrated off.  Remain on CRRT with net -300 cc. COOX 46%.  Went into A-fib with RVR requiring amiodarone  infusion 10/17 started getting confused and hallucinating, vasopressor requirement is improving, remain on CRRT with net negative fluid balance 10/18 patient was started on Precedex in the setting of agitation and restlessness.  Remained on CRRT with net -2.5 L.  Remain on dobutamine with Coox 50% 10/19 off precedex 10/20 On CRRT,  net neg 4.29 L, Coox 71.4,  10/22 plan to transition to [facility] 10/23 pressor requirement down-trending  10/24 Off dobutamine, on/off levophed     Interim History / Subjective:  Been off vasopressors, no acute issues IHD scheduled for 10/27   Objective   Blood pressure 101/77, pulse 68, temperature (!) 97.4 F (36.3 C), temperature source Axillary, resp. rate 17, height 6' 0.99 (1.854 m), weight 94.3 kg, SpO2 100%. CVP:  [2 mmHg-62 mmHg] 25 mmHg      Intake/Output Summary (Last 24 hours) at 09/09/2024 0753 Last data filed at 09/08/2024 2126 Gross per 24 hour  Intake 243 ml  Output --  Net 243 ml   Filed Weights   09/07/24 0500 09/08/24 0630 09/09/24 0600  Weight: 93.5 kg 94.5 kg 94.3 kg    Examination: General: acute on chronic adult male, sitting up in ICU bed on vent  HEENT: Normocephalic, PERRLA intact, Pink MM, missing teeth  CV: s1,s2, RRR, no MRG, No JVD  pulm: clear, diminished, no distress  Abs: bs active, soft  Extremities: no edema, deformity-right hand/wrist- contracture, moves all extremities on command, right foot amputation- ace wrap, left great toe- nail- black eschar, scab over  Skin: no rash  Neuro: Rass 0, follows commands  GU: deferred  Patient Lines/Drains/Airways Status     Active Line/Drains/Airways     Name Placement date Placement time Site Days   Arterial Line 08/27/24 Right Femoral 08/27/24  1200  Femoral  6   CVC Triple Lumen 08/27/24 Left Femoral 08/27/24  1500  -- 6   Fistula / Graft Left Forearm Arteriovenous fistula --  --  Forearm  --   Hemodialysis Catheter Right Internal jugular Triple lumen Temporary (  Non-Tunneled) 08/29/24  1000  Internal jugular  4   Midline Single Lumen 08/17/24 Right Cephalic 8 cm 0 cm 08/17/24  1541  Cephalic  16   Negative Pressure Wound Therapy Leg Left;Lower 11/30/22  1518  --  642   Negative Pressure Wound Therapy Leg Left;Lower;Anterior 03/22/23  1427  --  530   Wound 08/12/24 2300 Atypical Toe (Comment   which one) Anterior;Right 08/12/24  2300  Toe (Comment  which one)  21   Wound 08/25/24 1330 Other (Comment) Toe (Comment  which one) Anterior;Left 08/25/24  1330  Toe (Comment  which one)  8   Wound 08/29/24 0800 Pressure Injury Buttocks Right Deep Tissue Pressure Injury - Purple or maroon localized area of discolored intact skin or blood-filled blister due to damage of underlying soft tissue from pressure and/or shear. 08/29/24  0800  Buttocks  4   Wound 08/29/24 0800 Pressure Injury Buttocks Left Deep Tissue Pressure Injury - Purple or maroon localized area of discolored intact skin or blood-filled blister due to damage of underlying soft tissue from pressure and/or shear. 08/29/24  0800  Buttocks  4         Resolved Hospital Problem list   Hypoglycemia Symptomatic bradycardia, resolved Lactic acidosis QTc prolongation, resolved Hypokalemia/hypophosphatemia Hypervolemic hyponatremia Acute hyperactive delirium  Assessment & Plan:  58 year old male with ESRD-HD presents with acute on chronic pulmonology with cardiogenic shock   Patient is now off inotrope, and vasopressors (which was initially utilized for RV dysfunction and severe distributive shock ) Case tolerated his first IHD on 10/24 with goal of 1 L, though midodrine  was used a couple of times Patient has been off vasopressors, IHD scheduled for 10/27   Assessment and plan Acute on chronic cor pulmonale with cardiogenic shock-improving Severe pulmonary HTN Acute NSTEMI s/p DES-LAD Distributive shock-likely due to sepsis of unclear source-completed ABX therapy-improving A-fib rate controlled ESRD-renal replacement therapy-now on IHD - Acute hyperactive delirium-resolved Recent history of ESBL E. coli bacteremia and right foot osteomyelitis s/p post forefoot amputation - Completed ABX for 5-days, cultures have been negative will continue to monitor   Plan - Patient is now off inotrope and vasopressors, tolerated his first  HD on 10/24 - Next iHD scheduled for 10/27  - Continue DAPT therapy - Advanced heart failure team on board - If still hemodynamically stable will transfer on 10/27 after second IHD session    Labs   CBC: Recent Labs  Lab 09/03/24 0456 09/04/24 0426 09/05/24 0450 09/06/24 0442 09/07/24 0513  WBC 10.0 12.0* 11.4* 10.8* 10.4  HGB 8.7* 8.6* 8.4* 8.6* 8.1*  HCT 28.5* 28.1* 28.0* 27.6* 26.4*  MCV 96.6 97.6 98.9 97.9 97.1  PLT 263 264 225 233 207    Basic Metabolic Panel: Recent Labs  Lab 09/05/24 0450 09/06/24 0442 09/07/24 0513 09/08/24 0445 09/09/24 0345  NA 136 137 136 135 138  K 4.1 4.3 4.5 4.2 4.2  CL 103 103 103 98 102  CO2 24 23 21* 26 24  GLUCOSE 119* 128* 97 93 103*  BUN 20 39* 55* 40* 55*  CREATININE 2.36* 4.38* 6.81* 5.74* 7.53*  CALCIUM  7.8* 8.0* 7.7* 7.8* 7.9*  MG 2.5* 2.6* 2.4 2.1 2.1  PHOS 2.8 4.5 5.3* 4.1 3.7   GFR: Estimated Creatinine Clearance: 12.4 mL/min (A) (by C-G formula based on SCr of 7.53 mg/dL (H)). Recent Labs  Lab 09/04/24 0426 09/05/24 0450 09/06/24 0442 09/07/24 0513  WBC 12.0* 11.4* 10.8* 10.4    Liver Function Tests: Recent  Labs  Lab 09/05/24 0450 09/06/24 0442 09/07/24 0513 09/08/24 0445 09/09/24 0345  ALBUMIN  2.4* 2.5* 2.3* 2.2* 2.1*   No results for input(s): LIPASE, AMYLASE in the last 168 hours.  No results for input(s): AMMONIA in the last 168 hours.   ABG    Component Value Date/Time   PHART 7.439 08/31/2024 0244   PCO2ART 35.1 08/31/2024 0244   PO2ART 73 (L) 08/31/2024 0244   HCO3 23.8 08/31/2024 0244   TCO2 25 08/31/2024 0244   ACIDBASEDEF 14.0 (H) 08/27/2024 1303   O2SAT 71 09/09/2024 0434     Coagulation Profile: No results for input(s): INR, PROTIME in the last 168 hours.  Cardiac Enzymes: No results for input(s): CKTOTAL, CKMB, CKMBINDEX, TROPONINI in the last 168 hours.  HbA1C: Hgb A1c MFr Bld  Date/Time Value Ref Range Status  08/30/2024 04:08 AM 5.7 (H) 4.8 - 5.6 %  Final    Comment:    (NOTE) Diagnosis of Diabetes The following HbA1c ranges recommended by the American Diabetes Association (ADA) may be used as an aid in the diagnosis of diabetes mellitus.  Hemoglobin             Suggested A1C NGSP%              Diagnosis  <5.7                   Non Diabetic  5.7-6.4                Pre-Diabetic  >6.4                   Diabetic  <7.0                   Glycemic control for                       adults with diabetes.    03/31/2021 05:38 AM 5.6 4.8 - 5.6 % Final    Comment:    (NOTE) Pre diabetes:          5.7%-6.4%  Diabetes:              >6.4%  Glycemic control for   <7.0% adults with diabetes     CBG: Recent Labs  Lab 09/08/24 1526 09/08/24 1927 09/08/24 2315 09/09/24 0341 09/09/24 0742  GLUCAP 112* 99 114* 100* 108*     Lenny Drought, MD  [facility] Prefer epic messenger for cross cover needs   My critical care time: 30 minutes  Critical care time was exclusive of separately billable procedures and treating other patients.  Critical care was necessary to treat or prevent imminent or life-threatening deterioration.  Critical care was time spent personally by me on the following activities: development of treatment plan with patient and/or surrogate as well as nursing, discussions with consultants, evaluation of patient's response to treatment, examination of patient, obtaining history from patient or surrogate, ordering and performing treatments and interventions, ordering and review of laboratory studies, ordering and review of radiographic studies, pulse oximetry, re-evaluation of patient's condition and participation in multidisciplinary rounds.

## 2024-09-09 NOTE — Progress Notes (Signed)
 [name], male   DOB: July 30, 1966, 58 y.o.   MRN: [id number] [facility] Progress Note   Assessment/ Plan:   1.  Acute exacerbation of congestive heart failure with cardiogenic shock: Suspected to have associated septic component as well for which he has completed his antibiotic course.  CRRT was discontinued on 10/22 when he was felt to be at/below his EDW and he is now transitioned to intermittent hemodialysis. 2. ESRD: Status post failure of renal allograft following which he was restarted back on dialysis.  Transiently on CRRT for volume unloading which was discontinued on 10/22 and after a day without dialysis, successfully underwent hemodialysis on 10/24 with 1 L ultrafiltration.  Will order for hemodialysis again tomorrow up in the kidney dialysis unit with instructions for as needed midodrine  10 mg q. hourly that he previously took as an outpatient. 3. Anemia: Without overt blood loss, hemoglobin/hematocrit appear stable overnight, continue to monitor with ESA dosing. 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  Corrected calcium  and phosphorus are currently at goal and phosphorus binders restarted. 5.  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  Seen by wound care and follow-up plans reported with orthopedic surgery (per patient).  Subjective:   Without acute events overnight and likely to be transferred out of ICU today.  Intermittent right leg pain.  Objective:   BP 101/77   Pulse 68   Temp (!) 97.4 F (36.3 C) (Axillary)   Resp 17   Ht 6' 0.99 (1.854 m)   Wt 94.3 kg   SpO2 100%   BMI 27.43 kg/m   Physical Exam: Gen: Comfortably sitting up in recliner CVS: Regular rhythm/normal rate.  S1 and S2 normal.  Temporary right IJ HD catheter in place Resp: Diminished breath sounds over bases, no distinct rales or rhonchi. Abd: Soft, flat, nontender, bowel sounds normal Ext: Left RCF with poor outflow thrill and  suboptimal augmentation indicative of either inflow cephalic vein stenosis versus accessory vein.  Right leg status post TMA in supportive boot.  Left leg no edema  Labs: BMET Recent Labs  Lab 09/03/24 1519 09/04/24 0426 09/05/24 0450 09/06/24 0442 09/07/24 0513 09/08/24 0445 09/09/24 0345  NA 134* 134* 136 137 136 135 138  K 4.0 4.2 4.1 4.3 4.5 4.2 4.2  CL 99 101 103 103 103 98 102  CO2 25 22 24 23  21* 26 24  GLUCOSE 142* 111* 119* 128* 97 93 103*  BUN 17 18 20  39* 55* 40* 55*  CREATININE 2.07* 2.40* 2.36* 4.38* 6.81* 5.74* 7.53*  CALCIUM  7.5* 7.5* 7.8* 8.0* 7.7* 7.8* 7.9*  PHOS 2.8 2.3* 2.8 4.5 5.3* 4.1 3.7   CBC Recent Labs  Lab 09/04/24 0426 09/05/24 0450 09/06/24 0442 09/07/24 0513  WBC 12.0* 11.4* 10.8* 10.4  HGB 8.6* 8.4* 8.6* 8.1*  HCT 28.1* 28.0* 27.6* 26.4*  MCV 97.6 98.9 97.9 97.1  PLT 264 225 233 207      Medications:     (feeding supplement) PROSource Plus  30 mL Oral BID BM   allopurinol  100 mg Oral q AM   amiodarone   200 mg Oral BID   aspirin  EC  81 mg Oral Daily   atorvastatin   80 mg Oral QHS   calcitRIOL   1 mcg Oral Q M,W,F-HD   Chlorhexidine  Gluconate Cloth  6 each Topical Daily   clopidogrel   75 mg Oral Q breakfast   famotidine   10 mg Oral QHS   ferric citrate   420  mg Oral TID WC   [name]   Topical BID   heparin   5,000 Units Subcutaneous Q8H   midodrine   20 mg Oral Q8H   multivitamin  1 tablet Oral BID   pantoprazole   40 mg Oral Daily   QUEtiapine  25 mg Oral QHS   sodium chloride  flush  3 mL Intravenous Q12H   Gordy Blanch, MD 09/09/2024, 7:12 AM

## 2024-09-10 ENCOUNTER — Other Ambulatory Visit (HOSPITAL_COMMUNITY): Payer: Self-pay

## 2024-09-10 LAB — RENAL FUNCTION PANEL
Albumin: 2.1 g/dL — ABNORMAL LOW (ref 3.5–5.0)
Anion gap: 12 (ref 5–15)
BUN: 69 mg/dL — ABNORMAL HIGH (ref 6–20)
CO2: 25 mmol/L (ref 22–32)
Calcium: 8.1 mg/dL — ABNORMAL LOW (ref 8.9–10.3)
Chloride: 102 mmol/L (ref 98–111)
Creatinine, Ser: 9.65 mg/dL — ABNORMAL HIGH (ref 0.61–1.24)
GFR, Estimated: 6 mL/min — ABNORMAL LOW (ref >60)
Glucose, Bld: 89 mg/dL (ref 70–99)
Phosphorus: 4.2 mg/dL (ref 2.5–4.6)
Potassium: 4.5 mmol/L (ref 3.5–5.1)
Sodium: 139 mmol/L (ref 135–145)

## 2024-09-10 LAB — COOXEMETRY PANEL
Carboxyhemoglobin: 2.4 % — ABNORMAL HIGH (ref 0.5–1.5)
Methemoglobin: <0.7 % (ref 0.0–1.5)
O2 Saturation: 72 %
Total hemoglobin: 8.1 g/dL — ABNORMAL LOW (ref 12.0–16.0)

## 2024-09-10 LAB — GLUCOSE, CAPILLARY
Glucose-Capillary: 86 mg/dL (ref 70–99)
Glucose-Capillary: 98 mg/dL (ref 70–99)
Glucose-Capillary: 81 mg/dL (ref 70–99)
Glucose-Capillary: 91 mg/dL (ref 70–99)
Glucose-Capillary: 103 mg/dL — ABNORMAL HIGH (ref 70–99)

## 2024-09-10 LAB — MAGNESIUM: Magnesium: 2.1 mg/dL (ref 1.7–2.4)

## 2024-09-10 MED ORDER — VITAMIN C 500 MG PO TABS
250.0000 mg | ORAL_TABLET | Freq: Two times a day (BID) | ORAL | Status: DC
  Administered 2024-09-10 – 2024-09-13 (×7): 250 mg via ORAL
  Filled 2024-09-10 (×7): qty 1

## 2024-09-10 MED ORDER — FOLIC ACID 1 MG PO TABS
1.0000 mg | ORAL_TABLET | Freq: Every day | ORAL | Status: DC
  Administered 2024-09-10 – 2024-09-13 (×4): 1 mg via ORAL
  Filled 2024-09-10 (×4): qty 1

## 2024-09-10 MED ORDER — TAMSULOSIN HCL 0.4 MG PO CAPS
0.4000 mg | ORAL_CAPSULE | Freq: Every day | ORAL | Status: DC
  Administered 2024-09-10 – 2024-09-13 (×3): 0.4 mg via ORAL
  Filled 2024-09-10 (×3): qty 1

## 2024-09-10 MED ORDER — RENA-VITE PO TABS
1.0000 | ORAL_TABLET | Freq: Every day | ORAL | Status: DC
  Administered 2024-09-11 – 2024-09-12 (×2): 1 via ORAL
  Filled 2024-09-10 (×2): qty 1

## 2024-09-10 MED ORDER — TAMSULOSIN HCL 0.4 MG PO CAPS: 0.4000 mg | ORAL_CAPSULE | Freq: Every day | ORAL | Status: DC

## 2024-09-10 NOTE — Progress Notes (Signed)
 Nutrition Follow-up  DOCUMENTATION CODES:   Severe malnutrition in context of chronic illness  INTERVENTION:   Continue Regular Diet with 1200 mL fluid restriction -Allow double protein at meal times  Continue 30 ml ProSource Plus BID, each supplement provides 100 kcals and 15 grams protein.   Continue Magic cup BID with meals, each supplement provides 290 kcal and 9 grams of protein  Continue Renal MVI but decrease to daily Add Folic Acid 1 mg daily Add Vitamin C 250 mg BID x 30 days    NUTRITION DIAGNOSIS:   Severe Malnutrition related to chronic illness as evidenced by severe muscle depletion, mild fat depletion, severe fat depletion, moderate muscle depletion, energy intake < or equal to 75% for > or equal to 1 month.  Being addressed via po diet, supplements  GOAL:   Patient will meet greater than or equal to 90% of their needs  Progressing  MONITOR:   PO intake, Supplement acceptance, Labs  REASON FOR ASSESSMENT:   Consult Assessment of nutrition requirement/status  ASSESSMENT:   PMH significant for ESRD on iHD (MWF), chronic hypotension on Midodrine , hx of small AVMs, paroxysmal a. Fib s/p watchman not on [facility] who presented to the orthopedic clinic for R foot gangrene with a recent hx of ESBL Bacteremia and sepsis presumed to be due to osteomyelitis now s/p R transmetatarsal amputation on 08/25/2024.  10/11 R TMA 10/13 RHC: severe pulmonary arterial HTN, sig RV failure 10/15 CRRT initiated 10/22 CRRT discontinued 10/24 iHD  Appetite much improved; pt ate 100% of breakfast and took the morning Pro-Source. Pt also eating som Magic Cup  Recent po intake 50-95% of meals  iHD again today, on MWF scheduled. Pt with midodrine  TID plus every 1 hour prn order during iHD  Outpatient EDW 84 kg Current Wt: 96.1 kg Lowest Wt: 91.2 kg  Per WOC RN, previously noted DTI to buttocks and sacrum has resolved. +skin tears. Pt also with R transmetatarsal amputation on  10/11- follow-up with Ortho  Micronutrient Labs 09/03/24: CRP: 7.8 (H) Folate 4.3 (L) -plan to add supplementation  Vitamin C: <0.1 (L) -plan to add supplementation Zinc : 73 (wdl)  Labs: Potassium 4.5 (Wdl) Sodium 139 (wdl) Phosphorus 4.2 (wdl) Magnesium  2.1 (wdl) Corrected calcium  9.6 (albumin  2.1, serum calcium  8.1) CBGs 81-109  Meds: Calcitriol  Plavix  Pepcid  Protonix  Ferric Citrate  with meals Rena-Vite midodrine   Diet Order:   Diet Order             Diet regular Room service appropriate? Yes with Assist; Fluid consistency: Thin; Fluid restriction: 1200 mL Fluid  Diet effective now                   EDUCATION NEEDS:   Education needs have been addressed  Skin:  Skin Assessment: Skin Integrity Issues: Skin Integrity Issues:: Other (Comment) DTI: n/a Incisions: R TMA on 08/25/24, still wrapped in bandage Other: R buttock-skin tear (initially documented as possible DTI), L buttock-small linear dark line-likely friction per WOC  Last BM:  10/20  Height:   Ht Readings from Last 1 Encounters:  08/25/24 6' 0.99 (1.854 m)    Weight:   Wt Readings from Last 1 Encounters:  09/10/24 96.1 kg     BMI:  Body mass index is 27.96 kg/m.  Estimated Nutritional Needs:   Kcal:  2200-2400 kcals  Protein:  125-145 g  Fluid:  1000 mL plus UOP   Betsey Finger MS, RDN, LDN, CNSC Registered Dietitian 3 Clinical Nutrition RD Inpatient Contact Info in  Amion

## 2024-09-10 NOTE — Consult Note (Addendum)
 WOC Nurse Consult Note: Reason for Consult: Refer to previous WOC consult on 10/20. Previously noted Deep tissue pressure injury to buttocks and sacrum has resolved.   Pt is critically ill with multiple systemic factors which can impair healing.  They are on a low airloss mattress to reduce pressure.  Left outer lower buttock with full thickness shear/skin tear; red and moist .5X.3X.1cm  Right outer buttock with full thickness shear/skin tear; red and moist .2X.3X.1cm.  Right outer buttocks with orders provided for bedside nurses to perform as follows to protect from further injury: Apply foam dressing to bilat buttocks wounds and change Q 3 days or PRN soiling.  Please re-consult if further assistance is needed.  Thank-you,  Stephane Fought MSN, RN, CWOCN, CWCN-AP, CNS Contact Mon-Fri 0700-1500: [phone]

## 2024-09-10 NOTE — Progress Notes (Signed)
 [name], male   DOB: 26-Apr-1966, 58 y.o.   MRN: [id number] [facility] Progress Note   Subjective:   BP's soft, not on pressors but no bp's over 100 recently  Objective:   BP (!) 93/57   Pulse 97   Temp 98.4 F (36.9 C) (Oral)   Resp (!) 25   Ht 6' 0.99 (1.854 m)   Wt 96.1 kg   SpO2 (!) 89%   BMI 27.96 kg/m   Physical Exam: Gen: Comfortably sitting up in recliner CVS: Regular rhythm/normal rate.  S1 and S2 normal.  Temporary right IJ HD catheter in place Resp: Diminished breath sounds over bases, no distinct rales or rhonchi. Abd: Soft, flat, nontender, bowel sounds normal Ext: Left RCF with poor outflow thrill and suboptimal augmentation indicative of either inflow cephalic vein stenosis versus accessory vein.  Right leg status post TMA in supportive boot.  Left leg no edema    Assessment/ Plan:   1.  Acute exacerbation of congestive heart failure with cardiogenic shock: Suspected to have associated septic component as well for which he has completed his antibiotic course.  CRRT was discontinued on 10/22 and he is now transitioned to intermittent hemodialysis. Also getting high dose po midodrine  20 mg tid.  2. ESRD: Status post failure of renal allograft following which he was restarted back on dialysis.  Transiently on CRRT for volume excess which was dc'd 10/22 and had iHD 10/24 with 1 L ultrafiltration.  Plan is for next HD today.  3. Anemia: Without overt blood loss, hemoglobin/hematocrit appear stable overnight, continue to monitor with ESA dosing. 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  Corrected calcium  and phosphorus are currently at goal and phosphorus binders restarted. 5.  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  Seen by wound care and follow-up plans reported with orthopedic surgery (per patient).  Myer Fret  MD  [facility] 09/10/2024, 4:21 PM  Recent Labs  Lab 09/06/24 0442 09/07/24 0513  09/08/24 0445 09/09/24 0345 09/10/24 0341  HGB 8.6* 8.1*  --   --   --   ALBUMIN  2.5* 2.3*   < > 2.1* 2.1*  CALCIUM  8.0* 7.7*   < > 7.9* 8.1*  PHOS 4.5 5.3*   < > 3.7 4.2  CREATININE 4.38* 6.81*   < > 7.53* 9.65*  K 4.3 4.5   < > 4.2 4.5   < > = values in this interval not displayed.    Inpatient medications:  (feeding supplement) PROSource Plus  30 mL Oral BID BM   allopurinol  100 mg Oral q AM   amiodarone   200 mg Oral Daily   ascorbic acid  250 mg Oral BID   aspirin  EC  81 mg Oral Daily   atorvastatin   80 mg Oral QHS   calcitRIOL   1 mcg Oral Q M,W,F-HD   Chlorhexidine  Gluconate Cloth  6 each Topical Daily   clopidogrel   75 mg Oral Q breakfast   famotidine   10 mg Oral QHS   ferric citrate   420 mg Oral TID WC   folic acid  1 mg Oral Daily   [name]   Topical BID   heparin   5,000 Units Subcutaneous Q8H   midodrine   20 mg Oral Q8H   [START ON 09/11/2024] multivitamin  1 tablet Oral QHS   pantoprazole   40 mg Oral Daily   sodium chloride  flush  3 mL Intravenous Q12H   [START ON 09/11/2024] tamsulosin   0.4  mg Oral QPC breakfast    sodium chloride  10 mL/hr at 08/30/24 0700   sodium chloride , acetaminophen , bisacodyl, heparin , HYDROcodone -acetaminophen , midodrine , mouth rinse, polyethylene glycol, sodium chloride  flush
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
## 2024-09-10 NOTE — Progress Notes (Addendum)
 NAME:  [name], MRN:  [id number], DOB:  1966-05-04, LOS: 13 ADMISSION DATE:  08/25/2024, CONSULTATION DATE:  08/27/24 REFERRING MD:  Dr. Camellia Door, CHIEF COMPLAINT:  bradycardia, hypotension   History of Present Illness:  [name] is a 58 y/o M with a PMH significant for ESRD on iHD (MWF), chronic hypotension on Midodrine , hx of small AVMs, paroxysmal a. Fib s/p watchman not on [facility] [address] who presented to the orthopedic clinic for R foot gangrene with a recent hx of ESBL Bacteremia and sepsis presumed to be due to osteomyelitis now s/p R transmetatarsal amputation on 08/25/2024. T10/13 he developed some confusion, bradycardia, and hypotension requiring atropine administration and vasopressors. The patient was moved to ICU for hemodynamic monitoring and further care   Pertinent  Medical History  As above  Significant [name] Events: Including procedures, antibiotic start and stop dates in addition to other pertinent events   08/25/2024 admission to the [name] for R transmetatarsal amputation 08/27/2024 admitted to ICU for hypotension and bradycardia, CTA PE negative. TTE with severely enlarged R ventricle, new. NSTEMI with troponin >24K; cardiology started on heparin  gtt and ASA 10/14 underwent cardiac cath and LAD stenting, remain on epinephrine  and norepinephrine  10/15 patient is requiring multiple vasopressor support, started on CRRT.  Serum troponin remain elevated >24k 10/16 remain on vasopressor support with norepinephrine  and vasopressin .  Epinephrine  was titrated off.  Remain on CRRT with net -300 cc. COOX 46%.  Went into A-fib with RVR requiring amiodarone  infusion 10/17 started getting confused and hallucinating, vasopressor requirement is improving, remain on CRRT with net negative fluid balance 10/18 patient was started on Precedex in the setting of agitation and restlessness.  Remained on CRRT with net -2.5 L.  Remain on dobutamine with Coox 50% 10/19 off precedex 10/20 On CRRT,  net neg 4.29 L, Coox 71.4,  10/22 plan to transition to [name] 10/23 pressor requirement down-trending  10/24 Off dobutamine, on/off levophed   10/26 Been off vasopressors, no acute issues 10/27 seen by [name] RN. See note re: care     Interim History / Subjective:   No complaints  Feels well   Objective   Blood pressure 98/64, pulse 72, temperature 98 F (36.7 C), temperature source Oral, resp. rate (!) 21, height 6' 0.99 (1.854 m), weight 96.1 kg, SpO2 98%.        Intake/Output Summary (Last 24 hours) at 09/10/2024 0919 Last data filed at 09/10/2024 0815 Gross per 24 hour  Intake 265 ml  Output --  Net 265 ml   Filed Weights   09/08/24 0630 09/09/24 0600 09/10/24 0615  Weight: 94.5 kg 94.3 kg 96.1 kg    Examination: General this is a 58 year old male who is awake no distress HENT NCAT no JVD sclera not icteric  Pulm clear  Card rrr Abd soft Ext warm no edema right foot wrapped in ace Neuro intact    Resolved [name] Problem list   Hypoglycemia Symptomatic bradycardia, resolved Lactic acidosis QTc prolongation, resolved Hypokalemia/hypophosphatemia Hypervolemic hyponatremia Acute hyperactive delirium Cardiogenic shock  Distributive shock ->completed abx Hyperactive delirium   ESBL E. 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  [id number] on-going, stable,recovering or improving problem >25 min  Out to tele if tolerates iHD

## 2024-09-11 DIAGNOSIS — I214 Non-ST elevation (NSTEMI) myocardial infarction: Secondary | ICD-10-CM | POA: Diagnosis not present

## 2024-09-11 LAB — RENAL FUNCTION PANEL
Albumin: 2.2 g/dL — ABNORMAL LOW (ref 3.5–5.0)
Anion gap: 14 (ref 5–15)
BUN: 86 mg/dL — ABNORMAL HIGH (ref 6–20)
CO2: 23 mmol/L (ref 22–32)
Calcium: 7.9 mg/dL — ABNORMAL LOW (ref 8.9–10.3)
Chloride: 102 mmol/L (ref 98–111)
Creatinine, Ser: 11.59 mg/dL — ABNORMAL HIGH (ref 0.61–1.24)
GFR, Estimated: 5 mL/min — ABNORMAL LOW (ref >60)
Glucose, Bld: 92 mg/dL (ref 70–99)
Phosphorus: 4.5 mg/dL (ref 2.5–4.6)
Potassium: 5 mmol/L (ref 3.5–5.1)
Sodium: 139 mmol/L (ref 135–145)

## 2024-09-11 LAB — GLUCOSE, CAPILLARY
Glucose-Capillary: 101 mg/dL — ABNORMAL HIGH (ref 70–99)
Glucose-Capillary: 102 mg/dL — ABNORMAL HIGH (ref 70–99)
Glucose-Capillary: 123 mg/dL — ABNORMAL HIGH (ref 70–99)
Glucose-Capillary: 129 mg/dL — ABNORMAL HIGH (ref 70–99)
Glucose-Capillary: 162 mg/dL — ABNORMAL HIGH (ref 70–99)
Glucose-Capillary: 87 mg/dL (ref 70–99)
Glucose-Capillary: 88 mg/dL (ref 70–99)

## 2024-09-11 LAB — CBC
HCT: 24.8 % — ABNORMAL LOW (ref 39.0–52.0)
Hemoglobin: 7.5 g/dL — ABNORMAL LOW (ref 13.0–17.0)
MCH: 30 pg (ref 26.0–34.0)
MCHC: 30.2 g/dL (ref 30.0–36.0)
MCV: 99.2 fL (ref 80.0–100.0)
Platelets: 253 K/uL (ref 150–400)
RBC: 2.5 MIL/uL — ABNORMAL LOW (ref 4.22–5.81)
RDW: 21.7 % — ABNORMAL HIGH (ref 11.5–15.5)
WBC: 11 K/uL — ABNORMAL HIGH (ref 4.0–10.5)
nRBC: 0 % (ref 0.0–0.2)

## 2024-09-11 LAB — MAGNESIUM: Magnesium: 2.4 mg/dL (ref 1.7–2.4)

## 2024-09-11 MED ORDER — HEPARIN SODIUM (PORCINE) 1000 UNIT/ML DIALYSIS
40.0000 [IU]/kg | INTRAMUSCULAR | Status: DC | PRN
  Administered 2024-09-11 – 2024-09-13 (×2): 3800 [IU] via INTRAVENOUS_CENTRAL
  Filled 2024-09-11: qty 4

## 2024-09-11 MED ORDER — HEPARIN SODIUM (PORCINE) 1000 UNIT/ML IJ SOLN
INTRAMUSCULAR | Status: AC
  Filled 2024-09-11: qty 9

## 2024-09-11 MED ORDER — ALUM & MAG HYDROXIDE-SIMETH 200-200-20 MG/5ML PO SUSP
30.0000 mL | Freq: Four times a day (QID) | ORAL | Status: AC | PRN
  Administered 2024-09-11 – 2024-09-12 (×2): 30 mL via ORAL
  Filled 2024-09-11 (×2): qty 30

## 2024-09-11 NOTE — Progress Notes (Signed)
   09/11/24 1230  Vitals  Temp 98.2 F (36.8 C)  Pulse Rate (!) 116  Resp (!) 24  SpO2 (!) 87 %  O2 Device Room Air  Weight 95.3 kg  Type of Weight Post-Dialysis  Oxygen Therapy  Patient Activity (if Appropriate) In bed  Pulse Oximetry Type Continuous  Oximetry Probe Site Changed No  Post Treatment  Dialyzer Clearance Lightly streaked  Hemodialysis Intake (mL) 0 mL  Liters Processed 74.5  Fluid Removed (mL) 400 mL  Tolerated HD Treatment Yes  Post-Hemodialysis Comments Dr. Geralynn is aware of post HD bp and hr  AVG/AVF Arterial Site Held (minutes) 5 minutes  AVG/AVF Venous Site Held (minutes) 5 minutes   Tx is completed at pt bedside---2H18 Alert and oriented.  Informed consent signed and in chart.   TX duration:3.5 ordered---longer due to TMP pressures related to the increased pressure in the venous access of the AVF--nephrology is aware  Patient tolerated well. Alert, without acute distress.  Hand-off given to patient's nurse.   Access used: LLAVF Access issues: increased venous pressures---  Total UF removed: 400 Medication(s) given: mididrine 10mg  po q1hour while on tx Post report given to Dr. Geralynn [facility]

## 2024-09-11 NOTE — Plan of Care (Signed)

## 2024-09-11 NOTE — Progress Notes (Signed)
 Pt was educated on NSTEMI, stent card, stent location, Antiplatelet and ASA use, wt restrictions, no baths/daily wash-ups, s/s of infection, ex guidelines, s/s to stop exercising, NTG use and calling 911, heart healthy diet, risk factors and CRPII. Pt received MI book and materials on exercise, diet, and CRPII. Will refer to [facility].    Pt completed CR at [facility] before, I encouraged pt to consider completing again.   Garen FORBES Candy 09/11/2024 2:22 PM

## 2024-09-11 NOTE — Progress Notes (Addendum)
 PT Cancellation Note  Patient Details Name: [name] MRN: [id number] DOB: [birth year]   Cancelled Treatment:    Reason Eval/Treat Not Completed: Patient at procedure or test/unavailable  Getting HD at the moment. RN states pt not tolerating it great today with low pressures. Will check back this afternoon to see if he is doing well enough for therapy follow-up today. Otherwise may hold until tomorrow.  Addendum 1423: Pt feels too fatigued after dialysis. Will attempt to progress with rehab efforts tomorrow.   Dottie Roads, PT, DPT [facility]  Rehabilitation Services Physical Therapist Office: [phone] Website: [email or web address]   Leontine GORMAN Roads 09/11/2024, 11:26 AM

## 2024-09-11 NOTE — Progress Notes (Signed)
 Progress Note   Patient: [name] FMW:[id number] DOB: 13-Apr-1966 DOA: 08/25/2024     14 DOS: the patient was seen and examined on 09/11/2024   Brief hospital course: CC: gangrene right foot, s/p right transmetatarsal amputation HPI: 58 year old African-American male history of end-stage renal disease on dialysis Monday Wednesday Friday, chronic hypotension with normal systolic blood pressures in the 70s-90s, history of small bowel AVMs, history of DVT no longer on anticoagulation, history of paroxysmal atrial fibrillation status post Watchman device no longer requiring systemic anticoagulation who was recently discharged from [facility] on 08/17/2024 with a discharge diagnosis of ESBL E. coli septicemia from presumed osteomyelitis.  He was discharged to home with IV Invanz via midline in his right upper extremity.  He was supposed to complete 2 weeks of Invanz with the end date of 08/28/2024.  He presented to the orthopedic clinic yesterday and was noted to have gangrene of the right foot.  He was taken the operating room today by Dr. Kit where he had a transmetatarsal amputation performed.  [facility] hospitalist consulted for admission postoperatively.  Patient states that his blood pressure normally runs low with systolics ranging from 70-90 on a regular basis.  He is asymptomatic.  Significant Events: Admitted 08/25/2024 for s/p right transmetatarsal amputation   Admission Labs: WBC 9.0, HgB 9.4, plt 329 Na 140, K 3.7, CO2 of 26, BUN 21, Scr 7.13, glu 81  Admission Imaging Studies:   Significant Labs:   Significant Imaging Studies:   Antibiotic Therapy: Anti-infectives (From admission, onward)    Start     Dose/Rate Route Frequency Ordered Stop   08/25/24 0745  ceFAZolin  (ANCEF ) IVPB 2g/100 mL premix        2 g 200 mL/hr over 30 Minutes Intravenous On call to O.R. 08/25/24 0737 08/26/24 0559       Procedures: Right forefoot transmetatarsal  amputation  Consultants: Orthopedics ID nephrology  Assessment and Plan: Bradycardia 08/27/24 associated with hypoglycemia. EKG shows idioventricular rhythm. HR about 35 bpm associated with hypoglycemia and AMS. Transfer him to progressive unit. Called and update his wife.   Hypoglycemia 08/27/24 CBG 35 this AM. Associated with bradycardia.   Altered mental status 08/27/24 very lethargic this AM. Will wake up to gentle physical stimulation and to voice. Check stat EKG, VBG, CBC, BMP. Pt last dose of po oxycodone  10 mg at 0040. Last dose of IV dilaudid  was yesterday at 12:40 pm  **update. AMS due to hypoglycemia and bradycardia. Move to progressive care.   S/P transmetatarsal amputation of foot, right (HCC) 08/25/24 orthopedics to manage and arrange for post-op followup.  08/26/24 change pain meds to oxycodone  5 - 10 mg q4h prn moderate/severe pain, IV dilaudid  1 mg q2h prn for breakthrough pain. Pt needs PT consult.  08/27/24 awaiting ortho f/u note for discharge instructions. Stop IV dilaudid .   Infection due to ESBL-producing Escherichia coli 08/25/24 continue IV Invanz for now. He was suppose to remain on IV invanz therapy total of 2 weeks 08/14/24 - to -08/28/24. Defer to ID if this will change.  08/26/24 ID rec completing IV invanz for his full 14 course. Source control obtained. End date of therapy 08-28-2024.  08/27/24 s/p right transmet amputation. Will complete IV invanz on 08-28-2024. ID recommends no further abx.    Gangrene of right foot (HCC) 08/25/24 s/p right transmetatarsal amputation. Ortho is consulting ID for abx recs.  08/26/24 resolved with transmetatarsal amputation. ID rec completing IV invanz for his full 14 course. Source control obtained.  08/27/24 s/p right transmet amputation. Will complete IV invanz on 08-28-2024. ID recommends no further abx.   Chronic asymptomatic hypotension 08/25/24 chronic. Normal SBP 70-90 mm Hg. Pt is  asymptomatic.continue tid midodrine .  08/26/24 stable. BP elevated today due to pain.  08/27/24 continue midodrine .    Presence of Watchman left atrial appendage closure device - placed at [facility] 08/25/24 chronic. He does not require systemic anticoagulation for his PAF.  08/26/24 chronic. Stable.  08/27/24 chronic.   Chronic diastolic CHF (congestive heart failure) (HCC) 08/25/24 euvolemic. Volume status managed by dialysis  08/26/24 euvolemic. Stable.  08/27/24 euvolemic.   BPH (benign prostatic hyperplasia) 08/25/24 continue flomax .  08/26/24 stable.  08/27/24 stable.   OSA on CPAP 08/25/24 stable. Prn cpap.  08/26/24 stable.  08/27/24 stable.   Mixed hyperlipidemia 08/25/24 continue lipitor   08/26/24 stable.  08/27/24 stable.  ESRD on dialysis [facility]) - on M, W, F 08/25/24 nephrology consulted for routine HD  08/26/24 will need HD tomorrow.  08/27/24 nephrology consulted for HD.   Paroxysmal atrial fibrillation (HCC) - s/p watchman device 08/25/24 stable. On amiodarone  and Toprol -XL. On ASA. Not on systemic anticoagulation due to hx of small bowel AVMs and hx of GI bleeing. S/p watchman device  08/26/24 stable.  08/27/24 due to bradycardia. Will stop toprol -xl and amiodarone  for now.       {Tip this will not be part of the note when signed Body mass index is 27.73 kg/m. ,  Nutrition Documentation    Flowsheet Row Admission (Current) from 08/25/2024 in [facility]  Nutrition Problem Severe Malnutrition  Etiology chronic illness  Nutrition Goal Patient will meet greater than or equal to 90% of their needs  Interventions Refer to RD note for recommendations  ,  (Optional):26781}  Subjective: ***  Physical Exam: Vitals:   09/11/24 1700 09/11/24 1800 09/11/24 1900 09/11/24 2020  BP: 101/61 (!) 87/49    Pulse: (!) 110 (!) 103  100  Resp: (!) 31 (!) 24  (!) 24  Temp:   99.2 F (37.3 C)    TempSrc:   Oral   SpO2: 98% 98%  100%  Weight:      Height:       *** Data Reviewed: {Tip this will not be part of the note when signed- Document your independent interpretation of telemetry tracing, EKG, lab, Radiology test or any other diagnostic tests. Add any new diagnostic test ordered today. (Optional):26781} {Results:26384}  Family Communication: ***  Disposition: Status is: Inpatient {Inpatient:23812}  Planned Discharge Destination: {DISCHARGE DESTINATION_TRH:27031} {Tip this will not be part of the note when signed  DVT Prophylaxis  ., Scds  Heparin  injection 5,000 units  (Optional):26781}   Time spent: *** minutes  Author: Akiva Brassfield Al-Sultani, MD 09/11/2024 9:03 PM  For on call review [email or web address].

## 2024-09-11 NOTE — Progress Notes (Incomplete)
 Progress Note   Patient: [name] FMW:[id number] DOB: 03-29-1966 DOA: 08/25/2024     14 DOS: the patient was seen and examined on 09/11/2024   Brief hospital course: CC: gangrene right foot, s/p right transmetatarsal amputation HPI: 58 year old AA male with PMHx ESRD on HD (MWF), CAD s/p DES to LAD and LCx, HFpEF, pAF s/p Watchman 2/2 hx of AVMS, chronic hypotension on midodrine , h/o DVT no longer on anticoagulation, who was recently discharged from [facility] on 08/17/2024 with with a discharge diagnosis of ESBL E. coli septicemia from presumed osteomyelitis.  He was discharged to home with IV Invanz via midline in his right upper extremity.  He was supposed to complete 2 weeks of Invanz with the end date of 08/28/2024.  He presented to the orthopedic clinic on 10/10 and was noted to have gangrene of the right foot.  Underwent transmetatarsal amputation by Dr. Kit on 10/11.  [facility] hospital[facility] consulted for admission postoperatively.  Hospital course complicated on 10/13 by development of bradycardia to mid 30s, hypotension to 44/11, hypoglycemia to cBG 38, and encephalopathy requiring transfer to ICU for vasopresser support and hemodynamic monitoring.   CTA (10/13) showed no PE, main PA dilation (45 mm) with interventricular septal flattening and LV compression, suggesting pulmonary HTN and elevated RH pressures.   Assessment and Plan:  # Shock cardiogenic in the setting of bradycardia +/- septic  Was transferred to progressive unit on 08/27/2024 when he was found bradycardic to 35 bpm with associated hypoglycemia and altered mental status.    # Severe RV dysfunction # Pulmonary HTN # HFpEF   Infection due to ESBL-producing Escherichia coli ID consulted, recommended completion of IV ivanz until 08/28/2024.    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 Now back to iHD - HD session 10/28 - tolerated well with midodrine , 400 UF   Paroxysmal atrial fibrillation (HCC) - s/p watchman device - On amiodarone  and Toprol -XL  08/25/24 stable. On amiodarone  and Toprol -XL. On ASA. Not on systemic anticoagulation due to hx of small bowel AVMs and hx of GI bleeing. S/p watchman device   # Severe protein calorie malnutrition - RD following      Subjective: Patient seen while getting HD today. He is in good spirits, reports feeling much better. He is highly motivated to get better. Denies any significant pain at the site of amputation, but some discomfort in his right heel. Otherwise denies chest pain, SOB, abdominal pain, fevers, chills.   Physical Exam: Vitals:   09/11/24 1700 09/11/24 1800 09/11/24 1900 09/11/24 2020  BP: 101/61 (!) 87/49    Pulse: (!) 110 (!) 103  100  Resp: (!) 31 (!) 24  (!) 24  Temp:   99.2 F (37.3 C)   TempSrc:   Oral   SpO2: 98% 98%  100%  Weight:      Height:       Physical Exam Constitutional:      General: He is not in acute distress.    Appearance: He is not ill-appearing.  HENT:     Mouth/Throat:     Mouth: Mucous membranes are moist.  Eyes:     Pupils: Pupils are equal, round, and reactive to light.  Neck:     Comments: RIJ HD catheter Cardiovascular:     Rate and Rhythm: Tachycardia present. Rhythm irregular.     Heart sounds: Normal heart sounds. No murmur heard. Pulmonary:     Effort: Pulmonary effort is normal. No respiratory distress.     Breath sounds: Normal breath sounds. No wheezing.  Abdominal:     General: Bowel sounds are normal. There is no distension.     Palpations: Abdomen is soft.     Tenderness: There is no abdominal tenderness.  There is no guarding.  Musculoskeletal:     Right lower leg: No edema.     Left lower leg: No edema.     Comments: L forearm fistula     Right Lower Extremity: (transmetatarsal amputation) Skin:    General: Skin is warm and dry.     Capillary Refill: Capillary refill takes less than 2 seconds.  Neurological:     Mental Status: He is alert and oriented to person, place, and time. Mental status is at baseline.       Family Communication: None at bedside  Disposition: Status is: Inpatient Remains inpatient appropriate because: pending clinical improvement and safe dispo planning  Planned Discharge Destination: {DISCHARGE DESTINATION_TRH:27031} {Tip this will not be part of the note when signed  DVT Prophylaxis  ., Scds  Heparin  injection 5,000 units  (Optional):26781}   Time spent: *** minutes  Author: Gohan Collister Al-Sultani, MD 09/11/2024 9:03 PM  For on call review [email or web address].

## 2024-09-12 DIAGNOSIS — I214 Non-ST elevation (NSTEMI) myocardial infarction: Secondary | ICD-10-CM | POA: Diagnosis not present

## 2024-09-12 LAB — RENAL FUNCTION PANEL
Albumin: 2.1 g/dL — ABNORMAL LOW (ref 3.5–5.0)
Anion gap: 10 (ref 5–15)
BUN: 49 mg/dL — ABNORMAL HIGH (ref 6–20)
CO2: 28 mmol/L (ref 22–32)
Calcium: 8.2 mg/dL — ABNORMAL LOW (ref 8.9–10.3)
Chloride: 98 mmol/L (ref 98–111)
Creatinine, Ser: 7.66 mg/dL — ABNORMAL HIGH (ref 0.61–1.24)
GFR, Estimated: 8 mL/min — ABNORMAL LOW (ref >60)
Glucose, Bld: 78 mg/dL (ref 70–99)
Phosphorus: 3.5 mg/dL (ref 2.5–4.6)
Potassium: 4.5 mmol/L (ref 3.5–5.1)
Sodium: 136 mmol/L (ref 135–145)

## 2024-09-12 LAB — GLUCOSE, CAPILLARY
Glucose-Capillary: 82 mg/dL (ref 70–99)
Glucose-Capillary: 85 mg/dL (ref 70–99)
Glucose-Capillary: 77 mg/dL (ref 70–99)

## 2024-09-12 LAB — CBC
HCT: 24.5 % — ABNORMAL LOW (ref 39.0–52.0)
Hemoglobin: 7.5 g/dL — ABNORMAL LOW (ref 13.0–17.0)
MCH: 30.5 pg (ref 26.0–34.0)
MCHC: 30.6 g/dL (ref 30.0–36.0)
MCV: 99.6 fL (ref 80.0–100.0)
Platelets: 266 K/uL (ref 150–400)
RBC: 2.46 MIL/uL — ABNORMAL LOW (ref 4.22–5.81)
RDW: 22.3 % — ABNORMAL HIGH (ref 11.5–15.5)
WBC: 10.6 K/uL — ABNORMAL HIGH (ref 4.0–10.5)
nRBC: 0 % (ref 0.0–0.2)

## 2024-09-12 LAB — MAGNESIUM: Magnesium: 2 mg/dL (ref 1.7–2.4)

## 2024-09-12 MED ORDER — CHLORHEXIDINE GLUCONATE CLOTH 2 % EX PADS
6.0000 | MEDICATED_PAD | Freq: Every day | CUTANEOUS | Status: DC
  Administered 2024-09-13: 6 via TOPICAL

## 2024-09-12 NOTE — Progress Notes (Signed)
 PROGRESS NOTE    [name]  FMW:[id number] DOB: [birth year] DOA: 08/25/2024 PCP: Maree Virgilio SAUNDERS, MD     Brief Narrative:  [name] is a 58 year old AA male with PMHx ESRD on HD (MWF), CAD s/p DES to LAD and LCx, HFpEF, pAF s/p Watchman 2/2 hx of AVMS, chronic hypotension on midodrine , h/o DVT no longer on anticoagulation, who was recently discharged from [facility] on 08/17/2024 with with a discharge diagnosis of ESBL E. coli septicemia from presumed osteomyelitis.  He was discharged to home with IV Invanz via midline in his right upper extremity.  He was supposed to complete 2 weeks of Invanz with the end date of 08/28/2024.   He presented to the orthopedic clinic on 10/10 and was noted to have gangrene of the right foot.  Underwent transmetatarsal amputation by Dr. Kit on 10/11.  [facility] hospitalist consulted for admission postoperatively.   Hospital course complicated on 10/13 by development of bradycardia to mid 30s, hypotension to 44/11, hypoglycemia to CBG 38, and encephalopathy requiring transfer to ICU for vasopresser support and hemodynamic monitoring.    CTA (10/13) showed no PE, main PA dilation (45 mm) with interventricular septal flattening and LV compression, suggesting pulmonary HTN and elevated RH pressures.   New events last 24 hours / Subjective: Patient states after he had a burger for lunch yesterday, started having some abdominal discomfort, bloating and loose stools.  Skipped dinner and breakfast, minimal amount of vomitus last night but none since.  Assessment & Plan:   Principal Problem:   Non-ST elevation (NSTEMI) myocardial infarction [facility]) Active Problems:   S/P transmetatarsal amputation of foot, right (HCC)   Altered mental status   Hypoglycemia   Bradycardia   Chronic asymptomatic hypotension   Gangrene of right foot (HCC)   Infection due to ESBL-producing Escherichia coli   Paroxysmal atrial fibrillation (HCC) - s/p watchman device    ESRD on dialysis (HCC) - on M, W, F   Mixed hyperlipidemia   OSA on CPAP   BPH (benign prostatic hyperplasia)   Chronic diastolic CHF (congestive heart failure) (HCC)   Presence of Watchman left atrial appendage closure device - placed at [facility]   Shock [facility])   RVF (right ventricular failure) (HCC)   Protein-calorie malnutrition, severe   # Shock - mixed bradycardic and septic - resolved # Acute on chronic cor pulmonale with cardiogenic shock - resolved - Rapid Response called on 10/13 after patient was found bradycardic to 35 bpm, hypotensive to 41/15, hypoglecmic to 38, and encephalopathic. He was given atropine and midodrine , but ultimately required transfer to ICU for pressors and hemodynamic monitoring.  - Thought to be mixed cardiogenic and septic shock with low SVR initially  - Required Levophed , epinephrine , vasopressin , and dobutamine - Was covered broadly with linezolid /meropenem for 5 days - Ultimately weaned off pressors and inotropic support. Transferred back to medical floor on 10/28 - On midodrine  20 mg TID for chronic hypotension    # NSTEMI - resolved # CAD s/p DES to LAD and LCx - While in ICU, troponin elevated to > 24,000 - Was started on heparin  drip and ASA - Underwent cath on 10/14 which showed 99% ISR of mid LAD stent s/p DES placement - Continue aspirin , Plavix , Lipitor     # Pulmonary HTN - RHC 10/14 showed PA 72/34, PVR 6.21 - Was previously on sildenafil, but limited by chronic hypotension - V/Q scan showed no convincing evidence of acute on chronic PE   #  Paroxysmal atrial fibrillation s/p watchman device - While in ICU, he was placed on amiodarone  drip due to RVR. 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 Now back to iHD - HD session 10/28 - tolerated well with midodrine , 400 UF   # Infection due to ESBL-producing Escherichia coli - Completed IV ivanz until 08/28/2024 per ID's recommendations   # Gangrene of right foot s/p transmetatarsal amputation of foot, right - CAM walker when ambulating   # BPH - Continue flomax    # OSA on CPAP - On nighttime CPAP   # Mixed hyperlipidemia - Continue Lipitor    # Severe protein calorie malnutrition - RD following    # Acute hyperactive delirium - resolved - Had episodes of agitation and hallucinations in the ICU, required Precedex drip and Seroquel     DVT prophylaxis:  heparin  injection 5,000 Units Start: 08/28/24 2200 SCDs Start: 08/25/24 1242  Code Status: Full Family Communication: None at bedside Disposition Plan: Home  Status is: Inpatient Remains inpatient appropriate because: abdominal pain     Antimicrobials:  Anti-infectives (From admission, onward)    Start     Dose/Rate Route Frequency Ordered Stop   08/29/24 1000  linezolid  (ZYVOX ) tablet 600 mg        600 mg Oral Every 12 hours 08/29/24 0839 09/02/24 2108   08/29/24 0930  meropenem (MERREM) 1 g in sodium chloride  0.9 % 100 mL IVPB        1 g 200 mL/hr over 30 Minutes Intravenous Every 8 hours 08/29/24 0839 09/02/24 2138   08/25/24 2000  ertapenem [facility]) IVPB  Status:  Discontinued       Note to Pharmacy: Indication:  Osteomyelitis First Dose: Yes Last Day of Therapy:  08/25/2024 Labs - Once weekly:  CBC/D and BMP, Labs - Once weekly: ESR and CRP Method of administration:  Mini-Bag Plus / Gravity Method of administration may be changed at the discretion of home infusion pharmacist based upon ass   500 mg Intravenous Every 24 hours 08/25/24 1241 08/25/24 1335   08/25/24 2000  ertapenem (INVANZ) 500 mg in sodium chloride  0.9 % 50 mL IVPB  Status:  Discontinued        500 mg 100 mL/hr over 30 Minutes Intravenous Every 24 hours 08/25/24 1336 08/29/24 0839   08/25/24 0745  ceFAZolin  (ANCEF ) IVPB 2g/100 mL premix  Status:  Discontinued        2 g 200 mL/hr over 30 Minutes Intravenous On call to O.R. 08/25/24 0737 08/25/24 1240        Objective: Vitals:   09/12/24 1100 09/12/24 1103 09/12/24 1113 09/12/24 1143  BP: (!) 73/49 (!) 76/47 (!) 85/65   Pulse: 77 (!) 104 (!) 105   Resp: (!) 28 (!) 26 13   Temp:    98.2 F (36.8 C)  TempSrc:  Oral  SpO2: 96% 96% 98%   Weight:      Height:        Intake/Output Summary (Last 24 hours) at 09/12/2024 1211 Last data filed at 09/12/2024 0615 Gross per 24 hour  Intake 253 ml  Output 400 ml  Net -147 ml   Filed Weights   09/11/24 0741 09/11/24 1230 09/12/24 0615  Weight: 95.8 kg 95.3 kg 93 kg    Examination:  General exam: Appears calm and comfortable  Respiratory system: Clear to auscultation. Respiratory effort normal. No respiratory distress. No conversational dyspnea. On room air  Cardiovascular system: S1 & S2 heard, RRR. No murmurs. No pedal edema. Gastrointestinal system: Abdomen is nondistended, soft. Normal bowel sounds heard. Central nervous system: Alert and oriented. No focal neurological deficits. Speech clear.  Psychiatry: Judgement and insight appear normal. Mood & affect appropriate.   Data Reviewed: I have personally reviewed following labs and imaging studies  CBC: Recent Labs  Lab 09/06/24 0442 09/07/24 0513 09/11/24 0407 09/12/24 0419  WBC 10.8* 10.4 11.0* 10.6*  HGB 8.6* 8.1* 7.5* 7.5*  HCT 27.6* 26.4* 24.8* 24.5*  MCV 97.9 97.1 99.2 99.6  PLT 233 207 253 266   Basic  Metabolic Panel: Recent Labs  Lab 09/08/24 0445 09/09/24 0345 09/10/24 0341 09/11/24 0407 09/12/24 0419  NA 135 138 139 139 136  K 4.2 4.2 4.5 5.0 4.5  CL 98 102 102 102 98  CO2 26 24 25 23 28   GLUCOSE 93 103* 89 92 78  BUN 40* 55* 69* 86* 49*  CREATININE 5.74* 7.53* 9.65* 11.59* 7.66*  CALCIUM  7.8* 7.9* 8.1* 7.9* 8.2*  MG 2.1 2.1 2.1 2.4 2.0  PHOS 4.1 3.7 4.2 4.5 3.5   GFR: Estimated Creatinine Clearance: 12.2 mL/min (A) (by C-G formula based on SCr of 7.66 mg/dL (H)). Liver Function Tests: Recent Labs  Lab 09/08/24 0445 09/09/24 0345 09/10/24 0341 09/11/24 0407 09/12/24 0419  ALBUMIN  2.2* 2.1* 2.1* 2.2* 2.1*   No results for input(s): LIPASE, AMYLASE in the last 168 hours. No results for input(s): AMMONIA in the last 168 hours. Coagulation Profile: No results for input(s): INR, PROTIME in the last 168 hours. Cardiac Enzymes: No results for input(s): CKTOTAL, CKMB, CKMBINDEX, TROPONINI in the last 168 hours. BNP (last 3 results) No results for input(s): PROBNP in the last 8760 hours. HbA1C: No results for input(s): HGBA1C in the last 72 hours. CBG: Recent Labs  Lab 09/11/24 1953 09/11/24 2313 09/12/24 0427 09/12/24 0857 09/12/24 1141  GLUCAP 129* 88 82 85 77   Lipid Profile: No results for input(s): CHOL, HDL, LDLCALC, TRIG, CHOLHDL, LDLDIRECT in the last 72 hours. Thyroid Function Tests: No results for input(s): TSH, T4TOTAL, FREET4, T3FREE, THYROIDAB in the last 72 hours. Anemia Panel: No results for input(s): VITAMINB12, FOLATE, FERRITIN, TIBC, IRON , RETICCTPCT in the last 72 hours. Sepsis Labs: No results for input(s): PROCALCITON, LATICACIDVEN in the last 168 hours.  No results found for this or any previous visit (from the past 240 hours).    Radiology Studies: No results found.    Scheduled Meds:  (feeding supplement) PROSource Plus  30 mL Oral BID BM   allopurinol  100 mg Oral q  AM   amiodarone   200 mg Oral Daily   ascorbic acid  250 mg Oral BID   aspirin  EC  81 mg Oral Daily   atorvastatin   80 mg Oral QHS   calcitRIOL   1 mcg Oral Q M,W,F-HD   Chlorhexidine  Gluconate Cloth  6  each Topical Daily   clopidogrel   75 mg Oral Q breakfast   famotidine   10 mg Oral QHS   ferric citrate   420 mg Oral TID WC   folic acid  1 mg Oral Daily   [name]   Topical BID   heparin   5,000 Units Subcutaneous Q8H   midodrine   20 mg Oral Q8H   multivitamin  1 tablet Oral QHS   pantoprazole   40 mg Oral Daily   sodium chloride  flush  3 mL Intravenous Q12H   tamsulosin   0.4 mg Oral QPC breakfast   Continuous Infusions:  sodium chloride  10 mL/hr at 08/30/24 0700     LOS: 15 days   Time spent: 35 minutes   Delon Hoe, DO [facility] 09/12/2024, 12:11 PM   Available via Epic secure chat 7am-7pm After these hours, please refer to coverage provider listed on [email or web address]
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
## 2024-09-12 NOTE — Progress Notes (Addendum)
 [name], male   DOB: 10/20/1966, 59 y.o.   MRN: [id number] [name] Progress Note   Subjective:   Seen in room HD yest w/ stalbe BP's in the 80s mostly, net UF 0.4 L   Objective:   BP (!) 93/57   Pulse 97   Temp 98.4 F (36.9 C) (Oral)   Resp (!) 25   Ht 6' 0.99 (1.854 m)   Wt 96.1 kg   SpO2 (!) 89%   BMI 27.96 kg/m   Physical Exam: Gen: up in bed, no O2, no distress, calm CVS: Regular rhythm/normal rate.  S1 and S2 normal.  Temporary right IJ HD catheter in place Resp: Diminished breath sounds over bases, no distinct rales or rhonchi. Abd: Soft, flat, nontender, bowel sounds normal Ext: R foot TMA. Left leg no edema  LUE AVF +bruit   OP HD: MWF [facility] 3:45hr, 400/800, EDW 94kg, 2K/3Ca bath, no heparin    Assessment/ Plan:   1.  Acute exacerbation of congestive heart failure with cardiogenic shock: Suspected to have associated septic component as well for which he has completed his antibiotic course.  CRRT was discontinued on 10/22 and he is now transitioned to intermittent hemodialysis.  2. ESRD: Status post failure of renal allograft following which he was restarted back on dialysis.  Transiently on CRRT for volume excess which was dc'd 10/22 and had iHD 10/24 and 10/28 w/ total UF 1.4 L. Next HD tomorrow off schedule upstairs. Will have temp cath removed.  3.  Chronic hypotension: at home was taking midodrine  w/ HD every 1 hr prn. At OP unit his arrival BPs are in the 90s and during HD session SBP's are all 80s per the patient. He was started on midodrine  20mg  tid while here as well.  4. Anemia: Without overt blood loss, hemoglobin/hematocrit appear stable overnight, continue to monitor with ESA dosing. 5.  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  Corrected calcium  and phosphorus are currently at goal and phosphorus binders restarted. 6. 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  Seen by wound care and follow-up plans reported  with orthopedic surgery per the pt.    [name] Fret  MD  [facility] 09/12/2024, 12:43 PM  Recent Labs  Lab 09/11/24 0407 09/12/24 0419  HGB 7.5* 7.5*  ALBUMIN  2.2* 2.1*  CALCIUM  7.9* 8.2*  PHOS 4.5 3.5  CREATININE 11.59* 7.66*  K 5.0 4.5    Inpatient medications:  (feeding supplement) PROSource Plus  30 mL Oral BID BM   allopurinol  100 mg Oral q AM   amiodarone   200 mg Oral Daily   ascorbic acid  250 mg Oral BID   aspirin  EC  81 mg Oral Daily   atorvastatin   80 mg Oral QHS   calcitRIOL   1 mcg Oral Q M,W,F-HD   Chlorhexidine  Gluconate Cloth  6 each Topical Daily   clopidogrel   75 mg Oral Q breakfast   famotidine   10 mg Oral QHS   ferric citrate   420 mg Oral TID WC   folic acid  1 mg Oral Daily   [name]   Topical BID   heparin   5,000 Units Subcutaneous Q8H   midodrine   20 mg Oral Q8H   multivitamin  1 tablet Oral QHS   pantoprazole   40 mg Oral Daily   sodium chloride  flush  3 mL Intravenous Q12H   tamsulosin   0.4 mg Oral QPC breakfast     acetaminophen , alum & mag hydroxide-simeth, bisacodyl,  heparin , HYDROcodone -acetaminophen , midodrine , mouth rinse, polyethylene glycol, sodium chloride  flush

## 2024-09-12 NOTE — Progress Notes (Addendum)
 [name], male   DOB: 1965/11/20, 58 y.o.   MRN: [id number] [facility] Progress Note   Subjective:   On HD this am  Objective:   BP (!) 93/57   Pulse 97   Temp 98.4 F (36.9 C) (Oral)   Resp (!) 25   Ht 6' 0.99 (1.854 m)   Wt 96.1 kg   SpO2 (!) 89%   BMI 27.96 kg/m   Physical Exam: Gen: up in bed, no O2, no distress, calm CVS: Regular rhythm/normal rate.  S1 and S2 normal.  Temporary right IJ HD catheter in place Resp: Diminished breath sounds over bases, no distinct rales or rhonchi. Abd: Soft, flat, nontender, bowel sounds normal Ext: R foot TMA. Left leg no edema  LUE AVF +bruit   OP HD: MWF [facility] 3:45hr, 400/800, EDW 94kg, 2K/3Ca bath, no heparin    Assessment/ Plan:   1.  Acute exacerbation of congestive heart failure with cardiogenic shock: Suspected to have associated septic component as well for which he has completed his antibiotic course.  CRRT was discontinued on 10/22 and he is now transitioned to intermittent hemodialysis.  2. ESRD: Status post failure of renal allograft following which he was restarted back on dialysis.  Transiently on CRRT for volume excess which was dc'd 10/22 and had iHD 10/24 with 1 L ultrafiltration.  Plan is for next HD today.  3. Anemia: Without overt blood loss, hemoglobin/hematocrit appear stable overnight, continue to monitor with ESA dosing. 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  Corrected calcium  and phosphorus are currently at goal and phosphorus binders restarted. 5.  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  Seen by wound care and follow-up plans reported with orthopedic surgery per the pt.  6.  Chronic hypotension: at home was taking midodrine  w/ HD every 1 hr prn. At OP unit his arrival BPs are in the 90s and during HD session SBP's are all 80s per the patient. He was started on midodrine  20mg  tid while here as well.   Myer Fret  MD  [facility] 09/12/2024, 9:34 AM  Recent Labs   Lab 09/11/24 0407 09/12/24 0419  HGB 7.5* 7.5*  ALBUMIN  2.2* 2.1*  CALCIUM  7.9* 8.2*  PHOS 4.5 3.5  CREATININE 11.59* 7.66*  K 5.0 4.5    Inpatient medications:  (feeding supplement) PROSource Plus  30 mL Oral BID BM   allopurinol  100 mg Oral q AM   amiodarone   200 mg Oral Daily   ascorbic acid  250 mg Oral BID   aspirin  EC  81 mg Oral Daily   atorvastatin   80 mg Oral QHS   calcitRIOL   1 mcg Oral Q M,W,F-HD   Chlorhexidine  Gluconate Cloth  6 each Topical Daily   clopidogrel   75 mg Oral Q breakfast   famotidine   10 mg Oral QHS   ferric citrate   420 mg Oral TID WC   folic acid  1 mg Oral Daily   [name]   Topical BID   heparin   5,000 Units Subcutaneous Q8H   midodrine   20 mg Oral Q8H   multivitamin  1 tablet Oral QHS   pantoprazole   40 mg Oral Daily   sodium chloride  flush  3 mL Intravenous Q12H   tamsulosin   0.4 mg Oral QPC breakfast    sodium chloride  10 mL/hr at 08/30/24 0700   sodium chloride , acetaminophen , alum & mag hydroxide-simeth, bisacodyl, heparin , HYDROcodone -acetaminophen , midodrine , mouth rinse, polyethylene glycol, sodium chloride  flush

## 2024-09-12 NOTE — Progress Notes (Signed)
 HDC Removal Note: Temp HDC removed from Rt internal jugular after sterile site prepped per protocol. HDC catheter tip visualized and intact. Pressure held for 20 minutes. Pressure dressing applied. No redness, ecchymosis, edema, swelling, or drainage noted at site. Instructions provided on post [facility] removal care, including followup notification instructions.

## 2024-09-13 ENCOUNTER — Other Ambulatory Visit (HOSPITAL_COMMUNITY): Payer: Self-pay

## 2024-09-13 DIAGNOSIS — I214 Non-ST elevation (NSTEMI) myocardial infarction: Secondary | ICD-10-CM | POA: Diagnosis not present

## 2024-09-13 LAB — GLUCOSE, CAPILLARY: Glucose-Capillary: 78 mg/dL (ref 70–99)

## 2024-09-13 LAB — BASIC METABOLIC PANEL WITH GFR
Anion gap: 18 — ABNORMAL HIGH (ref 5–15)
BUN: 59 mg/dL — ABNORMAL HIGH (ref 6–20)
CO2: 24 mmol/L (ref 22–32)
Calcium: 8.5 mg/dL — ABNORMAL LOW (ref 8.9–10.3)
Chloride: 95 mmol/L — ABNORMAL LOW (ref 98–111)
Creatinine, Ser: 9.59 mg/dL — ABNORMAL HIGH (ref 0.61–1.24)
GFR, Estimated: 6 mL/min — ABNORMAL LOW (ref >60)
Glucose, Bld: 90 mg/dL (ref 70–99)
Potassium: 4.5 mmol/L (ref 3.5–5.1)
Sodium: 137 mmol/L (ref 135–145)

## 2024-09-13 LAB — CBC WITH DIFFERENTIAL/PLATELET
Basophils Absolute: 0.1 K/uL (ref 0.0–0.1)
Basophils Relative: 1 %
Eosinophils Absolute: 1.1 K/uL — ABNORMAL HIGH (ref 0.0–0.5)
Eosinophils Relative: 10 %
HCT: 24.8 % — ABNORMAL LOW (ref 39.0–52.0)
Hemoglobin: 7.6 g/dL — ABNORMAL LOW (ref 13.0–17.0)
Lymphocytes Relative: 14 %
Lymphs Abs: 1.6 K/uL (ref 0.7–4.0)
MCH: 30.6 pg (ref 26.0–34.0)
MCHC: 30.6 g/dL (ref 30.0–36.0)
MCV: 100 fL (ref 80.0–100.0)
Monocytes Absolute: 0.2 K/uL (ref 0.1–1.0)
Monocytes Relative: 2 %
Neutro Abs: 8.3 K/uL — ABNORMAL HIGH (ref 1.7–7.7)
Neutrophils Relative %: 73 %
Platelets: 276 K/uL (ref 150–400)
RBC: 2.48 MIL/uL — ABNORMAL LOW (ref 4.22–5.81)
RDW: 22.5 % — ABNORMAL HIGH (ref 11.5–15.5)
WBC: 11.4 K/uL — ABNORMAL HIGH (ref 4.0–10.5)
nRBC: 0 % (ref 0.0–0.2)

## 2024-09-13 MED ORDER — MIDODRINE HCL 5 MG PO TABS
ORAL_TABLET | ORAL | Status: AC
  Filled 2024-09-13: qty 8

## 2024-09-13 MED ORDER — MIDODRINE HCL 5 MG PO TABS: 10.0000 mg | ORAL_TABLET | Freq: Three times a day (TID) | ORAL | Status: DC

## 2024-09-13 MED ORDER — CLOPIDOGREL BISULFATE 75 MG PO TABS
75.0000 mg | ORAL_TABLET | Freq: Every day | ORAL | 0 refills | Status: AC
  Filled 2024-09-13: qty 30, 30d supply, fill #0

## 2024-09-13 MED ORDER — ACETAMINOPHEN 325 MG PO TABS
ORAL_TABLET | ORAL | Status: AC
  Filled 2024-09-13: qty 2

## 2024-09-13 MED ORDER — DARBEPOETIN ALFA 100 MCG/0.5ML IJ SOSY
100.0000 ug | PREFILLED_SYRINGE | INTRAMUSCULAR | Status: DC
  Filled 2024-09-13: qty 0.5

## 2024-09-13 MED ORDER — ORAL CARE MOUTH RINSE: 15.0000 mL | OROMUCOSAL | Status: DC | PRN

## 2024-09-13 MED ORDER — NEPRO/CARBSTEADY PO LIQD: 237.0000 mL | ORAL | Status: DC | PRN

## 2024-09-13 MED ORDER — LIDOCAINE-PRILOCAINE 2.5-2.5 % EX CREA: 1.0000 | TOPICAL_CREAM | CUTANEOUS | Status: DC | PRN

## 2024-09-13 MED ORDER — MIDODRINE HCL 10 MG PO TABS
10.0000 mg | ORAL_TABLET | Freq: Three times a day (TID) | ORAL | 0 refills | Status: DC
  Filled 2024-09-13: qty 90, 30d supply, fill #0

## 2024-09-13 MED ORDER — HEPARIN SODIUM (PORCINE) 1000 UNIT/ML DIALYSIS: 1000.0000 [IU] | INTRAMUSCULAR | Status: DC | PRN

## 2024-09-13 MED ORDER — ALTEPLASE 2 MG IJ SOLR: 2.0000 mg | Freq: Once | INTRAMUSCULAR | Status: DC | PRN

## 2024-09-13 MED ORDER — PENTAFLUOROPROP-TETRAFLUOROETH EX AERO: 1.0000 | INHALATION_SPRAY | CUTANEOUS | Status: DC | PRN

## 2024-09-13 MED ORDER — HEPARIN SODIUM (PORCINE) 1000 UNIT/ML IJ SOLN
INTRAMUSCULAR | Status: AC
  Filled 2024-09-13: qty 4

## 2024-09-13 MED ORDER — ANTICOAGULANT SODIUM CITRATE 4% (200MG/5ML) IV SOLN: 5.0000 mL | Status: DC | PRN

## 2024-09-13 MED ORDER — LIDOCAINE HCL (PF) 1 % IJ SOLN: 5.0000 mL | INTRAMUSCULAR | Status: DC | PRN

## 2024-09-13 NOTE — TOC Transition Note (Signed)
 Transition of Care (TOC) - Discharge Note Rayfield Gobble RN, BSN Inpatient Care Management Unit 4E- RN Case Manager See Treatment Team for direct phone #    Patient Details  Name: [name] MRN: [id number] Date of Birth: 08/28/1966  Transition of Care [facility]) CM/SW Contact:  Gobble Rayfield Hurst, RN Phone Number: 09/13/2024, 3:06 PM   Clinical Narrative:    Pt stable for transition home today, Per therapy pt needs RW for home, order has been placed.  Per bedside Rn pt also asking for tub bench- however insurance will not cover tub bench- pt will need to purchase one on his own.  Referral for DME- RW sent to [employer] - referral has been accepted and DME- RW to be delivered to pt prior to discharge.   No HH needs noted for discharge.   IP CM interventions have been completed no further needs noted.   Final next level of care: Home/Self Care Barriers to Discharge: Barriers Resolved   Patient Goals and CMS Choice Patient states their goals for this hospitalization and ongoing recovery are:: return home   Choice offered to / list presented to : NA      Discharge Placement                 home      Discharge Plan and Services Additional resources added to the After Visit Summary for     Discharge Planning Services: CM Consult Post Acute Care Choice: Durable Medical Equipment          DME Arranged: Vannie rolling DME Agency: [employer] Date DME Agency Contacted: 09/13/24 Time DME Agency Contacted: 1400 Representative spoke with at DME Agency: Zack HH Arranged: NA HH Agency: NA        Social Drivers of Health (SDOH) Interventions SDOH Screenings   Food Insecurity: No Food Insecurity (08/25/2024)  Housing: Low Risk  (08/25/2024)  Transportation Needs: No Transportation Needs (08/25/2024)  Utilities: Not At Risk (08/25/2024)  Depression (PHQ2-9): Low Risk  (08/25/2023)  Tobacco Use: Low Risk  (09/05/2024)     Readmission Risk Interventions     09/13/2024    3:06 PM 08/17/2024    3:30 PM 08/17/2024   10:25 AM  Readmission Risk Prevention Plan  Transportation Screening Complete Complete Complete  HRI or Home Care Consult  Complete Complete  Social Work Consult for Recovery Care Planning/Counseling  Complete Complete  Palliative Care Screening  Not Applicable Not Applicable  Medication Review Oceanographer) Complete Complete Complete  HRI or Home Care Consult Complete    SW Recovery Care/Counseling Consult Complete    Palliative Care Screening Not Applicable    Skilled Nursing Facility Not Applicable

## 2024-09-13 NOTE — Progress Notes (Signed)
 OT Cancellation Note  Patient Details Name: [name] MRN: [id number] DOB: Apr 13, 1966   Cancelled Treatment:    Reason Eval/Treat Not Completed: Patient at procedure or test/ unavailable. Pt off unit at HD, will follow up as able to.   Careena Degraffenreid C, OT  Acute Rehabilitation Services Office [phone] Secure chat preferred   Adrianne GORMAN Savers 09/13/2024, 9:08 AM

## 2024-09-13 NOTE — Progress Notes (Signed)
 PROGRESS NOTE    [name]  FMW:[id number] DOB: 1966/05/08 DOA: 08/25/2024 PCP: Maree Virgilio SAUNDERS, MD     Brief Narrative:  [name] is a 58 year old AA male with PMHx ESRD on HD (MWF), CAD s/p DES to LAD and LCx, HFpEF, pAF s/p Watchman 2/2 hx of AVMS, chronic hypotension on midodrine , h/o DVT no longer on anticoagulation, who was recently discharged from [facility] on 08/17/2024 with with a discharge diagnosis of ESBL E. coli septicemia from presumed osteomyelitis.  He was discharged to home with IV Invanz via midline in his right upper extremity.  He was supposed to complete 2 weeks of Invanz with the end date of 08/28/2024.   He presented to the orthopedic clinic on 10/10 and was noted to have gangrene of the right foot.  Underwent transmetatarsal amputation by Dr. Kit on 10/11.  [facility] hospital[facility] consulted for admission postoperatively.   Hospital course complicated on 10/13 by development of bradycardia to mid 30s, hypotension to 44/11, hypoglycemia to CBG 38, and encephalopathy requiring transfer to ICU for vasopresser support and hemodynamic monitoring.    CTA (10/13) showed no PE, main PA dilation (45 mm) with interventricular septal flattening and LV compression, suggesting pulmonary HTN and elevated RH pressures.   New events last 24 hours / Subjective: His abdominal issues from yesterday has now resolved.  Patient seen in hemodialysis unit.  Assessment & Plan:   Principal Problem:   Non-ST elevation (NSTEMI) myocardial infarction [facility]) Active Problems:   S/P transmetatarsal amputation of foot, right (HCC)   Altered mental status   Hypoglycemia   Bradycardia   Chronic asymptomatic hypotension   Gangrene of right foot (HCC)   Infection due to ESBL-producing Escherichia coli   Paroxysmal atrial fibrillation (HCC) - s/p watchman device   ESRD on dialysis (HCC) - on M, W, F   Mixed hyperlipidemia   OSA on CPAP   BPH (benign prostatic hyperplasia)    Chronic diastolic CHF (congestive heart failure) (HCC)   Presence of Watchman left atrial appendage closure device - placed at [facility]   Shock [facility])   RVF (right ventricular failure) (HCC)   Protein-calorie malnutrition, severe   # Shock - mixed bradycardic and septic - resolved # Acute on chronic cor pulmonale with cardiogenic shock - resolved - Rapid Response called on 10/13 after patient was found bradycardic to 35 bpm, hypotensive to 41/15, hypoglecmic to 38, and encephalopathic. He was given atropine and midodrine , but ultimately required transfer to ICU for pressors and hemodynamic monitoring.  - Thought to be mixed cardiogenic and septic shock with low SVR initially  - Required Levophed , epinephrine , vasopressin , and dobutamine - Was covered broadly with linezolid /meropenem for 5 days - Ultimately weaned off pressors and inotropic support. Transferred back to medical floor on 10/28 - On midodrine  20 mg TID for chronic hypotension    # NSTEMI - resolved # CAD s/p DES to LAD and LCx - While in ICU, troponin elevated to > 24,000 - Was started on heparin  drip and ASA - Underwent cath on 10/14 which showed 99% ISR of mid LAD stent s/p DES placement - Continue aspirin , Plavix , Lipitor     # Pulmonary HTN - RHC 10/14 showed PA 72/34, PVR 6.21 - Was previously on sildenafil, but limited by chronic hypotension - V/Q scan showed no convincing evidence of acute on chronic PE   # Paroxysmal atrial fibrillation s/p watchman device - While in ICU, he was placed on amiodarone  drip due to RVR.  Titrated down to PO amiodarone  200 mg daily - Continue amiodarone  200 mg daily - Monitor for bradycardia, would need to stop amiodarone    # Severe RV dysfunction # HFpEF - Echo 10/13 showed LVEF > 75%, severe RV dilation and severe RV dysfunction - RHC 10/14 showed severe PAH with CVP 19 and PCWP 9, significant RV failure - Cardiology evaluated, thought RV dysfunction most likely  secondary to pulmonary HTN - Echo 10/16 showed LVEF 55-60%, RV function not well visualized - GDMT limited by chronic hypotension   # ESRD on HD (MWF) - s/p failure of renal allograft  - Nephrology following for HD - Required CRRT while in the ICU from 10/15 - 10/22. Now back to iHD - HD session 10/28 - tolerated well with midodrine , 400 UF   # Infection due to ESBL-producing Escherichia coli - Completed IV ivanz until 08/28/2024 per ID's recommendations   # Gangrene of right foot s/p transmetatarsal amputation of foot, right - CAM walker when ambulating   # BPH - Continue flomax    # OSA on CPAP - On nighttime CPAP   # Mixed hyperlipidemia - Continue Lipitor    # Severe protein calorie malnutrition - RD following    # Acute hyperactive delirium - resolved - Had episodes of agitation and hallucinations in the ICU, required Precedex drip and Seroquel     DVT prophylaxis:  heparin  injection 5,000 Units Start: 08/28/24 2200 SCDs Start: 08/25/24 1242  Code Status: Full Family Communication: None at bedside Disposition Plan: Home  Status is: Inpatient  Antimicrobials:  Anti-infectives (From admission, onward)    Start     Dose/Rate Route Frequency Ordered Stop   08/29/24 1000  linezolid  (ZYVOX ) tablet 600 mg        600 mg Oral Every 12 hours 08/29/24 0839 09/02/24 2108   08/29/24 0930  meropenem (MERREM) 1 g in sodium chloride  0.9 % 100 mL IVPB        1 g 200 mL/hr over 30 Minutes Intravenous Every 8 hours 08/29/24 0839 09/02/24 2138   08/25/24 2000  ertapenem [facility]) IVPB  Status:  Discontinued       Note to Pharmacy: Indication:  Osteomyelitis First Dose: Yes Last Day of Therapy:  08/25/2024 Labs - Once weekly:  CBC/D and BMP, Labs - Once weekly: ESR and CRP Method of administration: Mini-Bag Plus / Gravity Method of administration may be changed at the discretion of home infusion pharmacist based upon ass   500 mg Intravenous Every 24 hours 08/25/24 1241  08/25/24 1335   08/25/24 2000  ertapenem (INVANZ) 500 mg in sodium chloride  0.9 % 50 mL IVPB  Status:  Discontinued        500 mg 100 mL/hr over 30 Minutes Intravenous Every 24 hours 08/25/24 1336 08/29/24 0839   08/25/24 0745  ceFAZolin  (ANCEF ) IVPB 2g/100 mL premix  Status:  Discontinued        2 g 200 mL/hr over 30 Minutes Intravenous On call to O.R. 08/25/24 0737 08/25/24 1240        Objective: Vitals:   09/13/24 1045 09/13/24 1100 09/13/24 1115 09/13/24 1130  BP: (!) 89/64 (!) 91/52 (!) 89/58 (!) 84/47  Pulse: (!) 120 (!) 118 (!) 119 (!) 119  Resp: (!) 22 20 (!) 22 19  Temp:      TempSrc:      SpO2: 100% 100% 100% 100%  Weight:      Height:        Intake/Output Summary (Last 24 hours)  at 09/13/2024 1148 Last data filed at 09/12/2024 2000 Gross per 24 hour  Intake --  Output 0 ml  Net 0 ml   Filed Weights   09/13/24 0404 09/13/24 0500 09/13/24 0758  Weight: 97.9 kg 97.9 kg 97.3 kg    Examination:  General exam: Appears calm and comfortable  Respiratory system: Clear to auscultation. Respiratory effort normal. No respiratory distress. No conversational dyspnea. On room air  Cardiovascular system: S1 & S2 heard, RRR. No murmurs. No pedal edema. Gastrointestinal system: Abdomen is nondistended, soft. Normal bowel sounds heard. Central nervous system: Alert and oriented. No focal neurological deficits. Speech clear.  Psychiatry: Judgement and insight appear normal. Mood & affect appropriate.   Data Reviewed: I have personally reviewed following labs and imaging studies  CBC: Recent Labs  Lab 09/07/24 0513 09/11/24 0407 09/12/24 0419 09/13/24 0646  WBC 10.4 11.0* 10.6* 11.4*  NEUTROABS  --   --   --  8.3*  HGB 8.1* 7.5* 7.5* 7.6*  HCT 26.4* 24.8* 24.5* 24.8*  MCV 97.1 99.2 99.6 100.0  PLT 207 253 266 276   Basic Metabolic Panel: Recent Labs  Lab 09/08/24 0445 09/09/24 0345 09/10/24 0341 09/11/24 0407 09/12/24 0419 09/13/24 0646  NA 135 138 139 139  136 137  K 4.2 4.2 4.5 5.0 4.5 4.5  CL 98 102 102 102 98 95*  CO2 26 24 25 23 28 24   GLUCOSE 93 103* 89 92 78 90  BUN 40* 55* 69* 86* 49* 59*  CREATININE 5.74* 7.53* 9.65* 11.59* 7.66* 9.59*  CALCIUM  7.8* 7.9* 8.1* 7.9* 8.2* 8.5*  MG 2.1 2.1 2.1 2.4 2.0  --   PHOS 4.1 3.7 4.2 4.5 3.5  --    GFR: Estimated Creatinine Clearance: 10.6 mL/min (A) (by C-G formula based on SCr of 9.59 mg/dL (H)). Liver Function Tests: Recent Labs  Lab 09/08/24 0445 09/09/24 0345 09/10/24 0341 09/11/24 0407 09/12/24 0419  ALBUMIN  2.2* 2.1* 2.1* 2.2* 2.1*   No results for input(s): LIPASE, AMYLASE in the last 168 hours. No results for input(s): AMMONIA in the last 168 hours. Coagulation Profile: No results for input(s): INR, PROTIME in the last 168 hours. Cardiac Enzymes: No results for input(s): CKTOTAL, CKMB, CKMBINDEX, TROPONINI in the last 168 hours. BNP (last 3 results) No results for input(s): PROBNP in the last 8760 hours. HbA1C: No results for input(s): HGBA1C in the last 72 hours. CBG: Recent Labs  Lab 09/11/24 1953 09/11/24 2313 09/12/24 0427 09/12/24 0857 09/12/24 1141  GLUCAP 129* 88 82 85 77   Lipid Profile: No results for input(s): CHOL, HDL, LDLCALC, TRIG, CHOLHDL, LDLDIRECT in the last 72 hours. Thyroid Function Tests: No results for input(s): TSH, T4TOTAL, FREET4, T3FREE, THYROIDAB in the last 72 hours. Anemia Panel: No results for input(s): VITAMINB12, FOLATE, FERRITIN, TIBC, IRON , RETICCTPCT in the last 72 hours. Sepsis Labs: No results for input(s): PROCALCITON, LATICACIDVEN in the last 168 hours.  No results found for this or any previous visit (from the past 240 hours).    Radiology Studies: No results found.    Scheduled Meds:  (feeding supplement) PROSource Plus  30 mL Oral BID BM   allopurinol  100 mg Oral q AM   amiodarone   200 mg Oral Daily   ascorbic acid  250 mg Oral BID   aspirin  EC  81  mg Oral Daily   atorvastatin   80 mg Oral QHS   calcitRIOL   1 mcg Oral Q M,W,F-HD   Chlorhexidine  Gluconate Cloth  6  each Topical Daily   Chlorhexidine  Gluconate Cloth  6 each Topical Q0600   clopidogrel   75 mg Oral Q breakfast   darbepoetin (ARANESP ) injection - DIALYSIS  100 mcg Subcutaneous Q Thu-1800   famotidine   10 mg Oral QHS   ferric citrate   420 mg Oral TID WC   folic acid  1 mg Oral Daily   [name]   Topical BID   heparin   5,000 Units Subcutaneous Q8H   midodrine   20 mg Oral Q8H   multivitamin  1 tablet Oral QHS   pantoprazole   40 mg Oral Daily   sodium chloride  flush  3 mL Intravenous Q12H   tamsulosin   0.4 mg Oral QPC breakfast   Continuous Infusions:  anticoagulant sodium citrate        LOS: 16 days   Time spent: 20 minutes   Delon Hoe, DO [facility] 09/13/2024, 11:48 AM   Available via Epic secure chat 7am-7pm After these hours, please refer to coverage provider listed on [email or web address]

## 2024-09-13 NOTE — Plan of Care (Signed)
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
## 2024-09-13 NOTE — Progress Notes (Signed)
   09/13/24 1153  Vitals  Temp 98.7 F (37.1 C)  Pulse Rate (!) 119  Resp (!) 23  BP (!) 95/58  SpO2 100 %  O2 Device Room Air  Weight 96.4 kg  Type of Weight Post-Dialysis  Oxygen Therapy  Patient Activity (if Appropriate) In bed  Pulse Oximetry Type Continuous  Oximetry Probe Site Changed No  Post Treatment  Dialyzer Clearance Lightly streaked  Hemodialysis Intake (mL) 0 mL  Liters Processed 84  Fluid Removed (mL) 1700 mL  Tolerated HD Treatment Yes  AVG/AVF Arterial Site Held (minutes) 6 minutes  AVG/AVF Venous Site Held (minutes) 6 minutes   Received patient in bed to unit.  Alert and oriented.  Informed consent signed and in chart.   TX duration:3.5  Patient tolerated well.  Transported back to the room  Alert, without acute distress.  Hand-off given to patient's nurse.   Access used: LLAF Access issues: no complications  Total UF removed: 1700 Medication(s) given: mididrine q 1 hour throughout the tx---Dr. Geralynn aware of the increased HR in the last part of the tx--no new orders Tylenol  po x 1   [name]

## 2024-09-13 NOTE — Progress Notes (Addendum)
 [name], male   DOB: 21-Apr-1966, 58 y.o.   MRN: [id number] [facility] Progress Note   Subjective:   Seen in HD  BP's stable, then had high HR in 120's late in session Asymptomatic, UF put on hold and lowered   Objective:   BP (!) 93/57   Pulse 97   Temp 98.4 F (36.9 C) (Oral)   Resp (!) 25   Ht 6' 0.99 (1.854 m)   Wt 96.1 kg   SpO2 (!) 89%   BMI 27.96 kg/m   Physical Exam: Gen: up in bed, no O2, no distress, calm CVS: Regular rhythm/normal rate.  S1 and S2 normal.  Temporary right IJ HD catheter in place Resp: Diminished breath sounds over bases, no distinct rales or rhonchi. Abd: Soft, flat, nontender, bowel sounds normal Ext: R foot TMA. Left leg no edema  LUE AVF +bruit   OP HD: MWF [facility] 3:45hr, 400/800, EDW 94kg, 2K/3Ca bath, no heparin    Assessment/ Plan:   1.  Acute exacerbation of congestive heart failure with cardiogenic shock: Suspected to have associated septic component as well for which he has completed his antibiotic course.  CRRT was discontinued on 10/22 and he is now transitioned to intermittent hemodialysis.  2. ESRD: Status post failure of renal allograft following which he was restarted back on dialysis.  Transiently on CRRT for volume excess which was dc'd 10/22 and had iHD 10/24 and 10/28 w/ total UF 1.4 L. Next HD tomorrow off schedule upstairs. Will have temp cath removed.  3.  Chronic hypotension: pt has significant chronic hypotension issues for a long time. At his OP unit was taking midodrine  w/ HD every 1 hr prn, and per pt the arrival BPs are in the 90s and during HD session BP's are in the 80s. He was started on midodrine  20mg  tid while here as well, not sure he needs this as the BP's aren't really any better. Will taper down the tid midodrine , watching bp's.  4. Anemia: no bleeding, but Hb dropped from 9's on arrival to 8s and now to 7s. Will start darbe 100 mcg sq weekly 5.  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  Corrected calcium  and phosphorus are currently at goal and phosphorus binders restarted. 6. 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  Seen by wound care and follow-up plans reported with orthopedic surgery per the pt.  7. Dispo: we think that he is okay for dc today. Regarding his BP's this is as good as it gets.    Myer Fret  MD  [facility] 09/13/2024, 11:40 AM  Recent Labs  Lab 09/11/24 0407 09/12/24 0419 09/13/24 0646  HGB 7.5* 7.5* 7.6*  ALBUMIN  2.2* 2.1*  --   CALCIUM  7.9* 8.2* 8.5*  PHOS 4.5 3.5  --   CREATININE 11.59* 7.66* 9.59*  K 5.0 4.5 4.5    Inpatient medications:  (feeding supplement) PROSource Plus  30 mL Oral BID BM   allopurinol  100 mg Oral q AM   amiodarone   200 mg Oral Daily   ascorbic acid  250 mg Oral BID   aspirin  EC  81 mg Oral Daily   atorvastatin   80 mg Oral QHS   calcitRIOL   1 mcg Oral Q M,W,F-HD   Chlorhexidine  Gluconate Cloth  6 each Topical Daily   Chlorhexidine  Gluconate Cloth  6 each Topical Q0600   clopidogrel   75 mg Oral Q breakfast   famotidine   10 mg Oral QHS  ferric citrate   420 mg Oral TID WC   folic acid  1 mg Oral Daily   [name]   Topical BID   heparin   5,000 Units Subcutaneous Q8H   midodrine   20 mg Oral Q8H   multivitamin  1 tablet Oral QHS   pantoprazole   40 mg Oral Daily   sodium chloride  flush  3 mL Intravenous Q12H   tamsulosin   0.4 mg Oral QPC breakfast    anticoagulant sodium citrate       acetaminophen , alteplase , anticoagulant sodium citrate , bisacodyl, feeding supplement (NEPRO CARB STEADY), heparin , heparin , HYDROcodone -acetaminophen , lidocaine  (PF), lidocaine -prilocaine , midodrine , mouth rinse, mouth rinse, pentafluoroprop-tetrafluoroeth, polyethylene glycol, sodium chloride  flush

## 2024-09-13 NOTE — Discharge Summary (Signed)
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 atherosclerotic calcifications. There is flattening of the interventricular septum with compression of the left ventricle. There is no acute dissection, intramural hematoma, aortic ulceration, or rupture of the thoracic aorta. LYMPH NODES: No mediastinal, hilar or axillary lymphadenopathy. LUNGS AND PLEURA: Scarring and atelectasis greatest in the lower lungs. Calcified right pleural plaques and pleural thickening. Trace right pleural effusion similar to prior. No pneumothorax. No focal consolidation or pulmonary edema. ESOPHAGUS: Fluid throughout the esophagus reaching the thoracic inlet. UPPER ABDOMEN: Nodular hepatic contour suggesting cirrhosis. Cholecystectomy. Partially visualized hazy stranding about the duodenum and pancreatic head. Small amount of perihepatic ascites. SOFT TISSUES AND BONES: Bilateral gynecomastia. No acute bone abnormality. IMPRESSION: 1. No pulmonary embolism. 2. Main pulmonary artery dilation (45 mm) with interventricular septal flattening and left  ventricular compression, suggesting pulmonary hypertension and elevated right heart pressures . 3. Fluid throughout the esophagus to the thoracic inlet, posing aspiration risk. 4. Partially visualized hazy stranding about the duodenum and pancreatic head; correlate with lipase for pancreatitis and recommend CT abdomen and pelvis with IV contrast. 5. Cirrhosis with small perihepatic ascites. Electronically signed by: Norman Gatlin MD 08/27/2024 05:35 PM EDT RP Workstation: [id number]   DG Chest Port 1 View Result Date: 08/27/2024 CLINICAL DATA:  Central line placement. EXAM: PORTABLE CHEST 1 VIEW COMPARISON:  08/12/2024 and CT chest 01/18/2023. FINDINGS: There is no visible central line. Trachea is midline given slight patient rotation. Heart size stable. Extensive pleuroparenchymal scarring and volume loss in the right hemithorax. Mild linear volume loss in the left lower lobe. Metallic densities project over the right chest. IMPRESSION: 1. No visible central line. 2. Pleuroparenchymal scarring in the right hemithorax. Electronically Signed   By: Newell Eke M.D.   On: 08/27/2024 16:31   ECHOCARDIOGRAM COMPLETE Result Date: 08/27/2024    ECHOCARDIOGRAM REPORT   Patient Name:   [name] Date of Exam: 08/27/2024 Medical Rec #:  [id number]      Height:       73.0 in Accession #:    [id number]     Weight:       213.0 lb Date of Birth:  03/13/1966      BSA:          2.210 m Patient Age:    58 years       BP:           121/96 mmHg Patient Gender: M              HR:           66 bpm. Exam Location:  Inpatient Procedure: 2D Echo, Cardiac Doppler and Color Doppler (Both Spectral and Color            Flow Doppler were utilized during procedure). STAT ECHO Indications:    Abnormal ECG R94.31  History:        Patient has prior history of Echocardiogram examinations, most                 recent 01/20/2023. CAD, PAD; Risk Factors:Hypertension and Sleep                 Apnea.  Sonographer:    Jayson Gaskins Referring  Phys: [id number] WARREN DEL DAGENHART  Sonographer  Comments: Technically difficult study due to poor echo windows and Technically challenging study due to limited acoustic windows. IMPRESSIONS  1. Technically difficult study.  2. Left ventricular ejection fraction, by estimation, is >75%. The left ventricle has hyperdynamic function. The left ventricle has no regional wall motion abnormalities.  3. RV/LV ratio is dilated. . Right ventricular systolic function visually reduced (severe). The right ventricular size is visually severely dilated.  4. The mitral valve is degenerative. Trivial mitral valve regurgitation. No evidence of mitral stenosis. Moderate to severe mitral annular calcification.  5. The aortic valve is calcified. Unable to determine aortic valve morphology due to image quality. There is severe calcifcation of the aortic valve. Aortic valve regurgitation is not visualized. Aortic valve sclerosis is present, with no evidence of aortic valve stenosis.  6. The inferior vena cava is dilated in size with <50% respiratory variability, suggesting right atrial pressure of 15 mmHg. Comparison(s): A prior study was performed on 01/16/2023. RV systolic function is severely reduced and size visually appears severly dialated, RV/LV ratio is dialated. Consider ruling out PE. Findings reported to Paula Southerly, MD via secure chat (acknowledged at 2:33pm 08/27/2024). Prior study reported LVEF 65-70%, indeterminate diastolic dysfunction, RV mildly reduced and mildly enlarged, RAP . FINDINGS  Left Ventricle: Left ventricular ejection fraction, by estimation, is >75%. The left ventricle has hyperdynamic function. The left ventricle has no regional wall motion abnormalities. The left ventricular internal cavity size was small. There is borderline left ventricular hypertrophy. Left ventricular diastolic function could not be evaluated due to nondiagnostic images. Right Ventricle: RV/LV ratio is dilated. The right  ventricular size is visually severely dilated. Right vetricular wall thickness was not well visualized. Right ventricular systolic function visually reduced (severe). Left Atrium: Left atrial size was not well visualized. Right Atrium: Right atrial size was normal in size. Pericardium: There is no evidence of pericardial effusion. Mitral Valve: The mitral valve is degenerative in appearance. Moderate to severe mitral annular calcification. Trivial mitral valve regurgitation. No evidence of mitral valve stenosis. Tricuspid Valve: The tricuspid valve is grossly normal. Tricuspid valve regurgitation is mild. Aortic Valve: The aortic valve is calcified. There is severe calcifcation of the aortic valve. There is severe aortic valve annular calcification. Aortic valve regurgitation is not visualized. Aortic valve sclerosis is present, with no evidence of aortic  valve stenosis. Aortic valve mean gradient measures 2.0 mmHg. Aortic valve peak gradient measures 4.0 mmHg. Aortic valve area, by VTI measures 3.10 cm. Pulmonic Valve: The pulmonic valve was not well visualized. Pulmonic valve regurgitation is trivial. Aorta: The aortic root is normal in size and structure. Venous: The inferior vena cava is dilated in size with less than 50% respiratory variability, suggesting right atrial pressure of 15 mmHg. IAS/Shunts: No atrial level shunt detected by color flow Doppler.  LEFT VENTRICLE PLAX 2D LVIDd:         3.70 cm LVIDs:         1.80 cm LV PW:         1.00 cm LV IVS:        1.50 cm LVOT diam:     1.80 cm LV SV:         46 LV SV Index:   21 LVOT Area:     2.54 cm  IVC IVC diam: 2.10 cm LEFT ATRIUM           Index        RIGHT ATRIUM  Index LA Vol (A4C): 61.8 ml 27.97 ml/m  RA Area:     21.60 cm                                    RA Volume:   63.70 ml  28.83 ml/m  AORTIC VALVE AV Area (Vmax):    2.70 cm AV Area (Vmean):   2.57 cm AV Area (VTI):     3.10 cm AV Vmax:           100.00 cm/s AV Vmean:           72.500 cm/s AV VTI:            0.147 m AV Peak Grad:      4.0 mmHg AV Mean Grad:      2.0 mmHg LVOT Vmax:         106.00 cm/s LVOT Vmean:        73.200 cm/s LVOT VTI:          0.179 m LVOT/AV VTI ratio: 1.22 MITRAL VALVE MV Area (PHT): 3.17 cm    SHUNTS MV Decel Time: 239 msec    Systemic VTI:  0.18 m MV E velocity: 87.50 cm/s  Systemic Diam: 1.80 cm Sunit Tolia Electronically signed by Madonna Large Signature Date/Time: 08/27/2024/2:37:04 PM    Final    US  EKG SITE RITE Result Date: 08/16/2024 If Site Rite image not attached, placement could not be confirmed due to current cardiac rhythm.     Discharge Exam: Vitals:   09/13/24 1146 09/13/24 1153  BP: 97/61 (!) 95/58  Pulse: (!) 119 (!) 119  Resp: 19 (!) 23  Temp:  98.7 F (37.1 C)  SpO2: 100% 100%    General: Pt is alert, awake, not in acute distress Cardiovascular:  S1/S2 +, no edema Respiratory: CTA bilaterally, no wheezing, no rhonchi, no respiratory distress, no conversational dyspnea  Abdominal: Soft, NT, ND, bowel sounds + Extremities: no edema Psych: Normal mood and affect, stable judgement and insight     The results of significant diagnostics from this hospitalization (including imaging, microbiology, ancillary and laboratory) are listed below for reference.     Microbiology: No results found for this or any previous visit (from the past 240 hours).   Labs: BNP (last 3 results) Recent Labs    08/27/24 0956  BNP 569.7*   Basic Metabolic Panel: Recent Labs  Lab 09/08/24 0445 09/09/24 0345 09/10/24 0341 09/11/24 0407 09/12/24 0419 09/13/24 0646  NA 135 138 139 139 136 137  K 4.2 4.2 4.5 5.0 4.5 4.5  CL 98 102 102 102 98 95*  CO2 26 24 25 23 28 24   GLUCOSE 93 103* 89 92 78 90  BUN 40* 55* 69* 86* 49* 59*  CREATININE 5.74* 7.53* 9.65* 11.59* 7.66* 9.59*  CALCIUM  7.8* 7.9* 8.1* 7.9* 8.2* 8.5*  MG 2.1 2.1 2.1 2.4 2.0  --   PHOS 4.1 3.7 4.2 4.5 3.5  --    Liver Function Tests: Recent Labs  Lab  09/08/24 0445 09/09/24 0345 09/10/24 0341 09/11/24 0407 09/12/24 0419  ALBUMIN  2.2* 2.1* 2.1* 2.2* 2.1*   No results for input(s): LIPASE, AMYLASE in the last 168 hours. No results for input(s): AMMONIA in the last 168 hours. CBC: Recent Labs  Lab 09/07/24 0513 09/11/24 0407 09/12/24 0419 09/13/24 0646  WBC 10.4 11.0* 10.6* 11.4*  NEUTROABS  --   --   --  8.3*  HGB 8.1* 7.5* 7.5* 7.6*  HCT 26.4* 24.8* 24.5* 24.8*  MCV 97.1 99.2 99.6 100.0  PLT 207 253 266 276   Cardiac Enzymes: No results for input(s): CKTOTAL, CKMB, CKMBINDEX, TROPONINI in the last 168 hours. BNP: Invalid input(s): POCBNP CBG: Recent Labs  Lab 09/11/24 1953 09/11/24 2313 09/12/24 0427 09/12/24 0857 09/12/24 1141  GLUCAP 129* 88 82 85 77   D-Dimer No results for input(s): DDIMER in the last 72 hours. Hgb A1c No results for input(s): HGBA1C in the last 72 hours. Lipid Profile No results for input(s): CHOL, HDL, LDLCALC, TRIG, CHOLHDL, LDLDIRECT in the last 72 hours. Thyroid function studies No results for input(s): TSH, T4TOTAL, T3FREE, THYROIDAB in the last 72 hours.  Invalid input(s): FREET3 Anemia work up No results for input(s): VITAMINB12, FOLATE, FERRITIN, TIBC, IRON , RETICCTPCT in the last 72 hours. Urinalysis    Component Value Date/Time   COLORURINE YELLOW 11/15/2016 0330   APPEARANCEUR CLEAR 11/15/2016 0330   LABSPEC 1.015 11/15/2016 0330   PHURINE 8.0 11/15/2016 0330   GLUCOSEU NEGATIVE 11/15/2016 0330   HGBUR SMALL (A) 11/15/2016 0330   BILIRUBINUR NEGATIVE 11/15/2016 0330   KETONESUR NEGATIVE 11/15/2016 0330   PROTEINUR 30 (A) 11/15/2016 0330   NITRITE NEGATIVE 11/15/2016 0330   LEUKOCYTESUR NEGATIVE 11/15/2016 0330   Sepsis Labs Recent Labs  Lab 09/07/24 0513 09/11/24 0407 09/12/24 0419 09/13/24 0646  WBC 10.4 11.0* 10.6* 11.4*   Microbiology No results found for this or any previous visit (from the past 240  hours).   Patient was seen and examined on the day of discharge and was found to be in stable condition. Time coordinating discharge: 40 minutes including assessment and coordination of care, as well as examination of the patient.   SIGNED:  Delon Hoe, DO [facility] 09/13/2024, 1:28 PM

## 2024-09-13 NOTE — Progress Notes (Addendum)
 DISCHARGE NOTE HOME [name] to be discharged Home per MD order. Discussed prescriptions and follow up appointments with the patient. Prescriptions given to patient; medication list explained in detail. Patient verbalized understanding.  Skin clean, dry and intact without evidence of skin break down, no evidence of skin tears noted. IV catheter discontinued intact. Site without signs and symptoms of complications. Dressing and pressure applied. Pt denies pain at the site currently. No complaints noted.  See LDA for wounds  and surgical incision at discharge right foot transmetatarsal amputation Patient free of other lines, drains, and wounds.   An After Visit Summary (AVS) was printed and given to the patient. Patient escorted via wheelchair, and discharged home via private auto.  [name] SHAUNNA Pepper, RN

## 2024-09-14 NOTE — Progress Notes (Signed)
 Late note entry, 1025am 10/31   D/c noted. Contacted out-pt HD clinic, [facility] to inform of pt d/c and anticipated arrival back Monday. D/c summary and last nephrology note faxed over at this time. No further support needed.   Lavanda Shatoria [place] Dialysis Navigator [phone]
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
## 2024-10-04 ENCOUNTER — Ambulatory Visit: Admitting: Internal Medicine

## 2024-10-04 ENCOUNTER — Encounter: Payer: Self-pay | Admitting: Internal Medicine

## 2024-10-04 VITALS — BP 110/64 | HR 74 | Temp 97.8°F | Ht 73.0 in | Wt 213.0 lb

## 2024-10-04 DIAGNOSIS — G4733 Obstructive sleep apnea (adult) (pediatric): Secondary | ICD-10-CM

## 2024-10-04 NOTE — Patient Instructions (Addendum)
 Order- DME [facility]- please change CPAP auto range to 8-20. Please refit mask of choice for better seal. Continue humidifier, supplies, AirView/ card  At check out today, ask the to bring you back with a sleep doctor

## 2024-10-06 ENCOUNTER — Encounter: Payer: Self-pay | Admitting: Internal Medicine

## 2024-10-17 ENCOUNTER — Ambulatory Visit: Admitting: Plastic Surgery

## 2024-10-17 VITALS — BP 125/85 | HR 94

## 2024-10-17 DIAGNOSIS — Z89431 Acquired absence of right foot: Secondary | ICD-10-CM | POA: Diagnosis not present

## 2024-10-17 DIAGNOSIS — S81801A Unspecified open wound, right lower leg, initial encounter: Secondary | ICD-10-CM | POA: Diagnosis not present

## 2024-10-17 DIAGNOSIS — S91301A Unspecified open wound, right foot, initial encounter: Secondary | ICD-10-CM
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
## 2024-10-30 ENCOUNTER — Telehealth: Payer: Self-pay | Admitting: Plastic Surgery

## 2024-10-30 NOTE — Telephone Encounter (Signed)
 Linda at [facility] reached out stating that pt called her asking for Home Health to be assigned to him to help change his dressing.  Pt has appt 11/01/2024 with Dr. Waddell.

## 2024-11-01 ENCOUNTER — Ambulatory Visit: Admitting: Plastic Surgery

## 2024-11-01 ENCOUNTER — Encounter: Payer: Self-pay | Admitting: Plastic Surgery

## 2024-11-01 DIAGNOSIS — S91301D Unspecified open wound, right foot, subsequent encounter: Secondary | ICD-10-CM | POA: Diagnosis not present

## 2024-11-01 DIAGNOSIS — S91301A Unspecified open wound, right foot, initial encounter: Secondary | ICD-10-CM

## 2024-11-01 DIAGNOSIS — Z89431 Acquired absence of right foot: Secondary | ICD-10-CM | POA: Diagnosis not present

## 2024-11-01 NOTE — Progress Notes (Addendum)
 Mr. [name] returns today for evaluation of the open wounds on his right transmet amputation site as well as his anterior pretibial region and heel.  He states that he feels the areas have been healing and look improved.  He does note pain on the lateral plantar surface just proximal to the incision.  He also notes pain on the medial aspect of the heel.  On examination all the wounds appear to be slowly healing.  There are no new wounds.  There is some necrotic tissue on the medial and lateral aspect of the transmet incision as well as on the heel wound.  I cannot express any fluid from the areas of discomfort and more concerned that this may reflect an issue with the underlying skeletal structure.  He is scheduled to see Dr. Kit tomorrow and I have asked him to discuss this with him.  Procedure: I sharply debrided 3 small areas on his foot today using scissors.  On the medial aspect of the transmet incision I removed a portion of necrotic tissue 2 cm x 2 cm x 2 mm all subcutaneous tissue, on the lateral aspect of the incision I removed an area 1 cm x 1 cm x 2 mm all subcutaneous tissue, on the medial heel wound I removed an area 1 cm x 1 cm x 2 mm again all subcutaneous tissue.  He tolerated the procedure well with no complications and had good bleeding at all 3 sites.  Dressings were replaced.  I have encouraged him to heavily pad the heel as this seems to be the area that has the most pressure right now.  He will return to see me in 2 to 3 weeks, sooner if necessary.

## 2024-11-13 NOTE — Progress Notes (Signed)
 ------------------------------ BASIC NOTE ------------------------------  Patient: [name] DOB: Sep 02, 1966 MRN: [id number] Note Author: ISIDOR MERRILYN LABOR, FNP-C Service Date: 11/06/2024  This patient was personally seen face-to-face for a basic visit as part of routine monthly dialysis care for end stage renal disease.  Attending Nephrologist: WATT TAMRA SAILOR Dialysis Location: [facility] Schedule: M-W-F Shift: 1  ------------------------------ OVERVIEW ------------------------------  COMMENTS: I have seen and spoken with patient today during basic dialysis rounds today. Patient denies any concerns at this time Patients BP 128/112, P-69. Patient denies chest pain, SOB, cramping, and edema. Patient reports appointment with orthopedics for follow-ups about R-foot went well, and they stated to continue dressing changes, he has a necrotic area to the tip of his L-great toe that he has an MRI scheduled for on 12/28. Patients L-AVF working well, patients KT/V 1.3. Patient denies any further concerns at this time. Left AVF in use for dialysis, no access issues reported, Kt/v 1.5 he complains of left hand being cold, denies any numbness or bleeding , advised warm glove, staff advised closely monitor for now, may need referral to [facility], has he has not been seen in >24yr.   PATIENT HISTORY COMMENTS: Recent hx PCI w/stent x3 , at [name] 07/05/23 , now on Metoprolol  25 BID , Plavix  75qd, he has completed cardiac rehab w/ [name]  ------------------------------ HOME MEDICATIONS ------------------------------  Current [facility] Outpatient Medications ------------------------------------------ tadalafil (Cialis) 10 MG tablet Take 2 tablets (20 mg total) by mouth 1 (one) time each day if needed for erectile dysfunction Start Date: 02/04/2021  Current [facility] Allergies ----------------------------- Allergen: Not on  File  ------------------------------ DIALYSIS PRESCRIPTION ------------------------------  COMMENTS: no changes, fluid restrictions advised  Treatment Data -------------- EDW (kg): 93  ------------------------------ BP AND FLUID ASSESSMENT ------------------------------  COMMENTS: takes Midodrine    ------------------------------ ACCESS ASSESSMENT ------------------------------  Current Access: AVF  COMMENTS: Left AVF- in use for dialysis, no access issues reported, good adequacy kt/v 1.5  ------------------------------ ADDITIONAL COMMENT ------------------------------  COMMENTS: 03/21/2024:  Patient seen and spoke to during comprehensive rounds. pt seeing a new cardiologist and starting amiodarone , getting LFT and Thyroid func periodically for the cardiologist. Calcium  is gone down to 6.6, temporarily increase the Tums to 2 at nighttime. Having issues of cramping, increase target weight to 95. Has had pretransplant evaluation follow-up again yesterday and awaiting to become active based upon their monthly meeting soon. Phosphorus 5.6 on Auryxia  2 to 3 with each meal. On midodrine  for intradialytic hypotension. Off cardiac rehab, exercise tolerance much better .URR 72, potassium 4.2, hemoglobin 12.7  03/28/2024: Patient seen and spoke to during basic rounds. Labs reviewed, Auryxia  2 with each meal, phosphorus 5.8. Patient has missed some binders. Compliance reinforced. Calcium  6.8 recommend to take 2 Tums at nighttime. PTH pending. Hemoglobin 13.1, albumin  3.8 and potassium 4.3.  04/23/2024:  Patient seen and spoke to during comprehensive rounds. Monthly labs so far pending. Last phosphorus was 5.8 and PTH was 49 in May. Awaiting final cardiac imaging and possibly CT of the abdomen to be placed back on the transplant list.  6.16.25: Stable  6.25.25: Stable  6.27.25: Patient seen And spoke to during comprehensive rounds. Vital signs and vol assessment reviewed.  Stable, no new c/o  05/16/24 :  Pt seen and spoke to during comp rounds. small skin breakdown right great toe. started on doxy 2 days ago. apt with podiatry pending. labs pending. is on auryxia  , midodrine  and amiodarone   06/13/24 :  Pt seen and spoke to during  basic rounds. Has had a spot on the left foot seeing [facility] in [place]  seen them yesterday. Calcium  has improved from 6.3 to 7.8 and he takes two at night time and hes on a higher calcium  bath. Is on inactive transplant list waiting for stress test again  06/20/2024 :  Patient seen and spoke to during comprehensive rounds. All labs from July reviewed. Target weight, blood pressures reviewed as well. Monthly labs from August pending. Access working ok. left foot wound, following with wound care.  07/06/2024:  Patient seen and spoke to during basic rounds. Dialysis treatment stable. [place] the [place]. doing ok so far   07/11/2024 :  Patient seen and spoke to during basic rounds. Overall stable. some symptoms of heartburn, he is on Pepcid , recommend to restart Protonix  as well. Has stress test scheduled at [facility] tomorrow.  07/25/2024:  Patient seen and spoke to during comprehensive rounds. Has the toenail in his left great toe has come off from the base and there appears to be some drainage yellowish beneath that as well, given his previous history of calciphylaxis and foot lesions, will send out 100 mg twice daily of doxycycline  for 10 days. Patient has appointment with podiatry in the ED tomorrow as well.  08/03/2024 :  Pt seen and spoke to during basic rounds. BP and Wt reviewed. overall stable., no new issues , toe nail removed by podiatrist. finishing up Doxy. Txp listing at [name] delayed due to Insurance issues .  08/08/2024 :  Patient successfully seen and spoke to during basic rounds. Labs reviewed. low Ca, not gotten calcitriol  at home For now, recommend to take calcitriol  1 mcg q.  treatment in center and continue Tums twice a day. PTH 50, phosphorus 4.1, hemoglobin 10.4  08/20/2024:  Patient seen and spoke to during basic rounds. Labs being drawn today. was in hosp at [facility] with right foot inf, was discharged with Ertapenem  with a midline right line. planning to see Ortho tomorrow with [facility].  08/22/2024:  Patient seen and spoke to during comprehensive rounds with the nurse new nurse practitioner. Vital signs reviewed. Increase target weight to 94. October labs pending. was in hosp at [facility] with right foot inf, was discharged with Ertapenem  with a midline right line To continue for another week, planning to see Ortho today with [facility].  09/19/2024:  Patient seen and spoke to during basic rounds. Reviewed recent hospitalization, has had forefoot amputation has appointment today with Dr. Kit and Dareen. Finished antibiotics. Make appointment with cardiology at [facility]  09/28/2024:  Patient seen and spoke to during comprehensive rounds. Weights and blood pressures reviewed. Has a dressing on the right forefoot amputation. Has appointment with Dr. Judeen next week. Hemoglobin 7.6, increase Mircera to 150. Phosphorus 4.3, potassium 4.1. Follow-up with referral to Dr. Ladona at [facility] cardiology which was done last week.  10/17/2024 :  Patient seen and spoke to during comprehensive rounds Blood pressure weights and all labs reviewed. December labs pending. Compliance reinforced, cardiology appt still pending. today seen by Plastic surg Dr Leonce Birmingham at [facility]   Signed by: ISIDOR MERRILYN LABOR, FNP-C on 11/06/2024 at 10:17:01 AM Transcribed by: ISIDOR MERRILYN LABOR, FNP-C on 11/06/2024 at 10:17:01 AM Reviewed and Signed by: WATT TAMRA SAILOR, MD on 11/13/2024 at 05:07:12 PM

## 2024-11-19 ENCOUNTER — Encounter (HOSPITAL_COMMUNITY): Payer: Self-pay | Admitting: Orthopedic Surgery

## 2024-11-19 ENCOUNTER — Other Ambulatory Visit: Payer: Self-pay
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
## 2024-11-19 NOTE — Progress Notes (Signed)
 PCP - Virgilio JONELLE Fairly, MD  Cardiologist - Norlene Piedmont, NP  Chest x-ray - 09/05/24 EKG - 09/04/24 Stress Test - 07/12/24 ECHO - 08/30/24 Cardiac Cath - 08/18/24  CPAP - Yes  Blood Thinner Instructions: LD 11/17/24 Aspirin  Instructions: LD 11/17/24  -Spoke with Megan at [facility] and confirmed this was ok.  Anesthesia review: Y  Patient verbally denies any shortness of breath, fever, cough and chest pain during phone call   -------------  SDW INSTRUCTIONS given:  Your procedure is scheduled on Tuesday, January 6th.  Report to [facility] Main Entrance A at 1130 A.M., and check in at the Admitting office.  Call this number if you have problems the morning of surgery:  [phone]   Remember:  Do not eat after midnight the night before your surgery  You may drink clear liquids until 0830 the morning of your surgery.   Clear liquids allowed are: Water , Non-Citrus Juices (without pulp), Carbonated Beverages, Clear Tea, Black Coffee Only, and Gatorade    Take these medicines the morning of surgery with A SIP OF WATER   pantoprazole  (PROTONIX )  acetaminophen  (TYLENOL )-if needed   As of today, STOP taking any Aspirin  (unless otherwise instructed by your surgeon) Aleve, Naproxen, Ibuprofen, Motrin, Advil, Goody's, BC's, all herbal medications, fish oil, and all vitamins.                      Do not wear jewelry, make up, or nail polish            Do not wear lotions, powders, perfumes/colognes, or deodorant.            Do not shave 48 hours prior to surgery.  Men may shave face and neck.            Do not bring valuables to the hospital.            [facility] is not responsible for any belongings or valuables.  Do NOT Smoke (Tobacco/Vaping) 24 hours prior to your procedure If you use a CPAP at night, you may bring all equipment for your overnight stay.   Contacts, glasses, dentures or bridgework may not be worn into surgery.      For patients admitted to the hospital,  discharge time will be determined by your treatment team.   Patients discharged the day of surgery will not be allowed to drive home, and someone needs to stay with them for 24 hours.    Special instructions:   - Preparing For Surgery  Before surgery, you can play an important role. Because skin is not sterile, your skin needs to be as free of germs as possible. You can reduce the number of germs on your skin by washing with CHG (chlorahexidine gluconate) Soap before surgery.  CHG is an antiseptic cleaner which kills germs and bonds with the skin to continue killing germs even after washing.    Oral Hygiene is also important to reduce your risk of infection.  Remember - BRUSH YOUR TEETH THE MORNING OF SURGERY WITH YOUR REGULAR TOOTHPASTE  Please do not use if you have an allergy to CHG or antibacterial soaps. If your skin becomes reddened/irritated stop using the CHG.  Do not shave (including legs and underarms) for at least 48 hours prior to first CHG shower. It is OK to shave your face.  Please follow these instructions carefully.   Shower the NIGHT BEFORE SURGERY and the MORNING OF SURGERY with DIAL Soap.  Pat yourself dry with a CLEAN TOWEL.  Wear CLEAN PAJAMAS to bed the night before surgery  Place CLEAN SHEETS on your bed the night of your first shower and DO NOT SLEEP WITH PETS.   Day of Surgery: Please shower morning of surgery  Wear Clean/Comfortable clothing the morning of surgery Do not apply any deodorants/lotions.   Remember to brush your teeth WITH YOUR REGULAR TOOTHPASTE.   Questions were answered. Patient verbalized understanding of instructions.

## 2024-11-20 ENCOUNTER — Ambulatory Visit (HOSPITAL_COMMUNITY): Admitting: Vascular Surgery

## 2024-11-20 ENCOUNTER — Encounter (HOSPITAL_COMMUNITY): Admission: RE | Disposition: A | Payer: Self-pay | Source: Home / Self Care | Attending: Orthopedic Surgery

## 2024-11-20 ENCOUNTER — Other Ambulatory Visit: Payer: Self-pay

## 2024-11-20 ENCOUNTER — Encounter (HOSPITAL_COMMUNITY): Payer: Self-pay | Admitting: Orthopedic Surgery

## 2024-11-20 ENCOUNTER — Ambulatory Visit (HOSPITAL_COMMUNITY)
Admission: RE | Admit: 2024-11-20 | Discharge: 2024-11-20 | Disposition: A | Discharge status: Home / Self Care | Attending: Orthopedic Surgery | Admitting: Orthopedic Surgery

## 2024-11-20 DIAGNOSIS — I5032 Chronic diastolic (congestive) heart failure: Secondary | ICD-10-CM | POA: Diagnosis not present

## 2024-11-20 DIAGNOSIS — M109 Gout, unspecified: Secondary | ICD-10-CM | POA: Diagnosis not present

## 2024-11-20 DIAGNOSIS — I2510 Atherosclerotic heart disease of native coronary artery without angina pectoris: Secondary | ICD-10-CM | POA: Diagnosis not present

## 2024-11-20 DIAGNOSIS — Z86718 Personal history of other venous thrombosis and embolism: Secondary | ICD-10-CM | POA: Insufficient documentation

## 2024-11-20 DIAGNOSIS — Z940 Kidney transplant status: Secondary | ICD-10-CM | POA: Diagnosis not present

## 2024-11-20 DIAGNOSIS — G4733 Obstructive sleep apnea (adult) (pediatric): Secondary | ICD-10-CM | POA: Insufficient documentation

## 2024-11-20 DIAGNOSIS — E785 Hyperlipidemia, unspecified: Secondary | ICD-10-CM | POA: Insufficient documentation

## 2024-11-20 DIAGNOSIS — M868X8 Other osteomyelitis, other site: Secondary | ICD-10-CM | POA: Diagnosis not present

## 2024-11-20 DIAGNOSIS — I96 Gangrene, not elsewhere classified: Secondary | ICD-10-CM | POA: Insufficient documentation

## 2024-11-20 DIAGNOSIS — I132 Hypertensive heart and chronic kidney disease with heart failure and with stage 5 chronic kidney disease, or end stage renal disease: Secondary | ICD-10-CM | POA: Diagnosis not present

## 2024-11-20 DIAGNOSIS — I471 Supraventricular tachycardia, unspecified: Secondary | ICD-10-CM | POA: Insufficient documentation

## 2024-11-20 DIAGNOSIS — N186 End stage renal disease: Secondary | ICD-10-CM | POA: Insufficient documentation

## 2024-11-20 DIAGNOSIS — K219 Gastro-esophageal reflux disease without esophagitis: Secondary | ICD-10-CM | POA: Diagnosis not present

## 2024-11-20 DIAGNOSIS — I2729 Other secondary pulmonary hypertension: Secondary | ICD-10-CM | POA: Diagnosis not present

## 2024-11-20 DIAGNOSIS — I252 Old myocardial infarction: Secondary | ICD-10-CM | POA: Diagnosis not present

## 2024-11-20 DIAGNOSIS — Z955 Presence of coronary angioplasty implant and graft: Secondary | ICD-10-CM | POA: Insufficient documentation

## 2024-11-20 DIAGNOSIS — I480 Paroxysmal atrial fibrillation: Secondary | ICD-10-CM | POA: Diagnosis not present

## 2024-11-20 DIAGNOSIS — Z79899 Other long term (current) drug therapy: Secondary | ICD-10-CM | POA: Insufficient documentation

## 2024-11-20 DIAGNOSIS — I50812 Chronic right heart failure: Secondary | ICD-10-CM | POA: Diagnosis not present

## 2024-11-20 DIAGNOSIS — Z992 Dependence on renal dialysis: Secondary | ICD-10-CM | POA: Diagnosis not present

## 2024-11-20 HISTORY — PX: AMPUTATION TOE: SHX6595

## 2024-11-20 HISTORY — DX: Right heart failure, unspecified: I50.810

## 2024-11-20 HISTORY — DX: Non-ST elevation (NSTEMI) myocardial infarction: I21.4

## 2024-11-20 HISTORY — DX: Chronic diastolic (congestive) heart failure: I50.32

## 2024-11-20 LAB — SURGICAL PCR SCREEN
MRSA, PCR: POSITIVE — AB
Staphylococcus aureus: POSITIVE — AB

## 2024-11-20 SURGERY — AMPUTATION, TOE
Anesthesia: Monitor Anesthesia Care | Site: Toe | Laterality: Left

## 2024-11-20 MED ORDER — MIDAZOLAM HCL (PF) 2 MG/2ML IJ SOLN
INTRAMUSCULAR | Status: DC | PRN
  Administered 2024-11-20: 2 mg via INTRAVENOUS

## 2024-11-20 MED ORDER — SODIUM CHLORIDE 0.9 % IV SOLN: INTRAVENOUS | Status: DC

## 2024-11-20 MED ORDER — DROPERIDOL 2.5 MG/ML IJ SOLN: 0.6250 mg | Freq: Once | INTRAMUSCULAR | Status: DC | PRN

## 2024-11-20 MED ORDER — LIDOCAINE HCL (CARDIAC) PF 100 MG/5ML IV SOSY
PREFILLED_SYRINGE | INTRAVENOUS | Status: DC | PRN
  Administered 2024-11-20: 50 mg via INTRAVENOUS

## 2024-11-20 MED ORDER — CEFAZOLIN SODIUM-DEXTROSE 2-4 GM/100ML-% IV SOLN
2.0000 g | INTRAVENOUS | Status: AC
  Administered 2024-11-20: 2 g via INTRAVENOUS

## 2024-11-20 MED ORDER — PHENYLEPHRINE 80 MCG/ML (10ML) SYRINGE FOR IV PUSH (FOR BLOOD PRESSURE SUPPORT)
PREFILLED_SYRINGE | INTRAVENOUS | Status: DC | PRN
  Administered 2024-11-20 (×6): 160 ug via INTRAVENOUS

## 2024-11-20 MED ORDER — OXYCODONE HCL 5 MG/5ML PO SOLN: 5.0000 mg | Freq: Once | ORAL | Status: DC | PRN

## 2024-11-20 MED ORDER — FENTANYL CITRATE (PF) 100 MCG/2ML IJ SOLN
INTRAMUSCULAR | Status: AC
  Filled 2024-11-20: qty 2

## 2024-11-20 MED ORDER — PHENYLEPHRINE 80 MCG/ML (10ML) SYRINGE FOR IV PUSH (FOR BLOOD PRESSURE SUPPORT)
PREFILLED_SYRINGE | INTRAVENOUS | Status: AC
  Filled 2024-11-20: qty 20

## 2024-11-20 MED ORDER — ONDANSETRON HCL 4 MG/2ML IJ SOLN
INTRAMUSCULAR | Status: DC | PRN
  Administered 2024-11-20: 4 mg via INTRAVENOUS

## 2024-11-20 MED ORDER — CHLORHEXIDINE GLUCONATE 4 % EX SOLN: 60.0000 mL | Freq: Once | CUTANEOUS | Status: DC

## 2024-11-20 MED ORDER — 0.9 % SODIUM CHLORIDE (POUR BTL) OPTIME
TOPICAL | Status: DC | PRN
  Administered 2024-11-20: 1000 mL

## 2024-11-20 MED ORDER — CEFAZOLIN SODIUM-DEXTROSE 2-4 GM/100ML-% IV SOLN
INTRAVENOUS | Status: AC
  Filled 2024-11-20: qty 100

## 2024-11-20 MED ORDER — PROPOFOL 500 MG/50ML IV EMUL
INTRAVENOUS | Status: DC | PRN
  Administered 2024-11-20: 75 ug/kg/min via INTRAVENOUS
  Administered 2024-11-20 (×2): 20 mg via INTRAVENOUS

## 2024-11-20 MED ORDER — CHLORHEXIDINE GLUCONATE 0.12 % MT SOLN
OROMUCOSAL | Status: AC
  Administered 2024-11-20: 15 mL via OROMUCOSAL
  Filled 2024-11-20: qty 15

## 2024-11-20 MED ORDER — MIDAZOLAM HCL 2 MG/2ML IJ SOLN
INTRAMUSCULAR | Status: AC
  Filled 2024-11-20: qty 2

## 2024-11-20 MED ORDER — EPHEDRINE SULFATE-NACL 50-0.9 MG/10ML-% IV SOSY
PREFILLED_SYRINGE | INTRAVENOUS | Status: DC | PRN
  Administered 2024-11-20 (×3): 5 mg via INTRAVENOUS

## 2024-11-20 MED ORDER — ORAL CARE MOUTH RINSE: 15.0000 mL | Freq: Once | OROMUCOSAL | Status: AC

## 2024-11-20 MED ORDER — POVIDONE-IODINE 10 % EX SWAB
2.0000 | Freq: Once | CUTANEOUS | Status: AC
  Administered 2024-11-20: 2 via TOPICAL

## 2024-11-20 MED ORDER — PHENYLEPHRINE HCL-NACL 20-0.9 MG/250ML-% IV SOLN
INTRAVENOUS | Status: DC | PRN
  Administered 2024-11-20: 25 ug/min via INTRAVENOUS

## 2024-11-20 MED ORDER — OXYCODONE HCL 5 MG PO TABS: 5.0000 mg | ORAL_TABLET | Freq: Once | ORAL | Status: DC | PRN

## 2024-11-20 MED ORDER — CHLORHEXIDINE GLUCONATE 0.12 % MT SOLN: 15.0000 mL | Freq: Once | OROMUCOSAL | Status: AC

## 2024-11-20 MED ORDER — BUPIVACAINE-EPINEPHRINE (PF) 0.5% -1:200000 IJ SOLN
INTRAMUSCULAR | Status: AC
  Filled 2024-11-20: qty 30

## 2024-11-20 MED ORDER — FENTANYL CITRATE (PF) 100 MCG/2ML IJ SOLN
INTRAMUSCULAR | Status: DC | PRN
  Administered 2024-11-20 (×2): 25 ug via INTRAVENOUS

## 2024-11-20 MED ORDER — LIDOCAINE 2% (20 MG/ML) 5 ML SYRINGE
INTRAMUSCULAR | Status: AC
  Filled 2024-11-20: qty 5

## 2024-11-20 MED ORDER — EPHEDRINE 5 MG/ML INJ
INTRAVENOUS | Status: AC
  Filled 2024-11-20: qty 5

## 2024-11-20 MED ORDER — BUPIVACAINE-EPINEPHRINE 0.5% -1:200000 IJ SOLN
INTRAMUSCULAR | Status: DC | PRN
  Administered 2024-11-20: 10 mL

## 2024-11-20 MED ORDER — HYDROMORPHONE HCL 1 MG/ML IJ SOLN: 0.2500 mg | INTRAMUSCULAR | Status: DC | PRN

## 2024-11-20 MED ORDER — ONDANSETRON HCL 4 MG/2ML IJ SOLN: 4.0000 mg | Freq: Once | INTRAMUSCULAR | Status: DC | PRN

## 2024-11-20 MED ORDER — TRAMADOL HCL 50 MG PO TABS: 50.0000 mg | ORAL_TABLET | Freq: Four times a day (QID) | ORAL | 0 refills | Status: AC | PRN

## 2024-11-20 MED ORDER — ONDANSETRON HCL 4 MG/2ML IJ SOLN
INTRAMUSCULAR | Status: AC
  Filled 2024-11-20: qty 2

## 2024-11-20 SURGICAL SUPPLY — 38 items
BAG COUNTER SPONGE SURGICOUNT (BAG) ×1 IMPLANT
BLADE LONG MED 31X9 (MISCELLANEOUS) IMPLANT
BLADE SAW RECIP 87.9 MT (BLADE) ×1 IMPLANT
BNDG COHESIVE 6X5 TAN ST LF (GAUZE/BANDAGES/DRESSINGS) ×1 IMPLANT
BNDG COMPR ESMARK 4X3 LF (GAUZE/BANDAGES/DRESSINGS) ×1 IMPLANT
BNDG ELASTIC 4INX 5YD STR LF (GAUZE/BANDAGES/DRESSINGS) IMPLANT
BNDG GAUZE DERMACEA FLUFF 4 (GAUZE/BANDAGES/DRESSINGS) IMPLANT
CANISTER SUCTION 3000ML PPV (SUCTIONS) ×1 IMPLANT
CHLORAPREP W/TINT 26 (MISCELLANEOUS) ×1 IMPLANT
COVER SURGICAL LIGHT HANDLE (MISCELLANEOUS) ×1 IMPLANT
CUFF TOURN SGL QUICK 42 (TOURNIQUET CUFF) IMPLANT
CUFF TRNQT CYL 34X4.125X (TOURNIQUET CUFF) IMPLANT
DRAPE U-SHAPE 47X51 STRL (DRAPES) ×2 IMPLANT
DRSG ADAPTIC 3X8 NADH LF (GAUZE/BANDAGES/DRESSINGS) ×1 IMPLANT
ELECTRODE REM PT RTRN 9FT ADLT (ELECTROSURGICAL) ×1 IMPLANT
GAUZE PAD ABD 8X10 STRL (GAUZE/BANDAGES/DRESSINGS) IMPLANT
GAUZE SPONGE 4X4 12PLY STRL (GAUZE/BANDAGES/DRESSINGS) ×1 IMPLANT
GLOVE BIO SURGEON STRL SZ8 (GLOVE) ×2 IMPLANT
GLOVE BIOGEL PI IND STRL 8 (GLOVE) ×1 IMPLANT
GLOVE BIOGEL PI MICRO 8.0 (GLOVE) ×1 IMPLANT
GOWN STRL REUS W/ TWL LRG LVL3 (GOWN DISPOSABLE) ×1 IMPLANT
GOWN STRL REUS W/ TWL XL LVL3 (GOWN DISPOSABLE) ×2 IMPLANT
KIT BASIN OR (CUSTOM PROCEDURE TRAY) ×1 IMPLANT
KIT TURNOVER KIT B (KITS) ×1 IMPLANT
NEEDLE HYPO 25X1 1.5 SAFETY (NEEDLE) IMPLANT
PACK ORTHO EXTREMITY (CUSTOM PROCEDURE TRAY) ×1 IMPLANT
PAD ARMBOARD POSITIONER FOAM (MISCELLANEOUS) ×2 IMPLANT
PAD CAST 4YDX4 CTTN HI CHSV (CAST SUPPLIES) ×1 IMPLANT
SOLN 0.9% NACL POUR BTL 1000ML (IV SOLUTION) ×1 IMPLANT
SOLN STERILE WATER BTL 1000 ML (IV SOLUTION) ×1 IMPLANT
SPONGE T-LAP 18X18 ~~LOC~~+RFID (SPONGE) ×1 IMPLANT
SUCTION TUBE FRAZIER 10FR DISP (SUCTIONS) ×1 IMPLANT
SUT ETHILON 2 0 PSLX (SUTURE) ×2 IMPLANT
SUT ETHILON 3 0 PS 1 (SUTURE) ×1 IMPLANT
TOWEL GREEN STERILE (TOWEL DISPOSABLE) ×1 IMPLANT
TOWEL GREEN STERILE FF (TOWEL DISPOSABLE) ×1 IMPLANT
TUBE CONNECTING 12X1/4 (SUCTIONS) ×1 IMPLANT
UNDERPAD 30X36 HEAVY ABSORB (UNDERPADS AND DIAPERS) ×1 IMPLANT
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
## 2024-11-20 NOTE — Anesthesia Postprocedure Evaluation (Signed)
"   Anesthesia Post Note  Patient: [name]  Procedure(s) Performed: AMPUTATION, TOE (Left: Toe)     Patient location during evaluation: PACU Anesthesia Type: MAC Level of consciousness: awake and alert and oriented Pain management: pain level controlled Vital Signs Assessment: post-procedure vital signs reviewed and stable Respiratory status: spontaneous breathing, nonlabored ventilation and respiratory function stable Cardiovascular status: stable and blood pressure returned to baseline Postop Assessment: no apparent nausea or vomiting Anesthetic complications: no   No notable events documented.  Last Vitals:  Vitals:   11/20/24 1517 11/20/24 1530  BP: (!) 105/59 (!) 87/58  Pulse: 83 86  Resp: (!) 27 17  Temp: 36.8 C   SpO2: 100% 100%    Last Pain:  Vitals:   11/20/24 1517  TempSrc:   PainSc: 0-No pain                 Aimie Wagman A.      "

## 2024-11-20 NOTE — Anesthesia Preprocedure Evaluation (Addendum)
 "                                  Anesthesia Evaluation  Patient identified by MRN, date of birth, ID band Patient awake    Reviewed: Allergy & Precautions, NPO status , Patient's Chart, lab work & pertinent test results, reviewed documented beta blocker date and time   History of Anesthesia Complications (+) history of anesthetic complications  Airway Mallampati: III  TM Distance: >3 FB Neck ROM: Full    Dental  (+) Poor Dentition, Dental Advisory Given, Missing   Pulmonary sleep apnea and Continuous Positive Airway Pressure Ventilation , pneumonia   breath sounds clear to auscultation + decreased breath sounds      Cardiovascular hypertension, Pt. on medications + CAD, + Past MI, + Cardiac Stents and +CHF  Normal cardiovascular exam+ dysrhythmias Atrial Fibrillation and Supra Ventricular Tachycardia  Rhythm:Regular Rate:Normal  Limited TTE 08/30/2024:  1. Left ventricular ejection fraction, by estimation, is 55 to 60%. The  left ventricle has normal function. Left ventricular endocardial border  not optimally defined to evaluate regional wall motion. The left  ventricular internal cavity size was  moderately dilated. There is mild concentric left ventricular hypertrophy.  Left ventricular diastolic function could not be evaluated.   2. Right ventricular systolic function was not well visualized. The right  ventricular size is not well visualized.   3. The mitral valve is degenerative. No evidence of mitral valve  regurgitation. No evidence of mitral stenosis. Moderate to severe mitral  annular calcification.   4. The aortic valve is calcified. There is severe calcifcation of the  aortic valve. Aortic valve regurgitation is not visualized. Aortic valve  sclerosis/calcification is present, without any evidence of aortic  stenosis.   5. The inferior vena cava is normal in size with greater than 50%  respiratory variability, suggesting right atrial pressure of 3  mmHg.   Cardiac cath 1016/25   CULPRIT LESION previously placed mid LAD stent has a 99%-distal stent in-stent restenosis.   A drug-eluting stent was successfully placed using a STENT SYNERGY XD 3.0X24 postdilated to 3.6 mm. Post intervention, there is a 0% residual stenosis.  TIMI-3 flow maintained.   Dist LAD lesion is 40% stenosed.   Previously placed Prox Cx to Mid Cx stent of unknown type is  widely patent with 80% stenosed side branch in 1st Mrg.   1st LPL lesion is 100% stenosed.   Hemodynamic findings consistent with Severe Pulmonary Hypertension.  LV End Diastolic Pressure is normal.   There is no aortic valve stenosis.     Neuro/Psych negative neurological ROS  negative psych ROS   GI/Hepatic Neg liver ROS,GERD  Medicated,,  Endo/Other  HLD Gout   Renal/GU ESRF and DialysisRenal diseaseLast dialysis-  negative genitourinary   Musculoskeletal Gangrene right foot S/P right TMA   Abdominal   Peds  Hematology  (+) Blood dyscrasia, anemia   Anesthesia Other Findings   Reproductive/Obstetrics                              Anesthesia Physical Anesthesia Plan  ASA: 4  Anesthesia Plan: MAC   Post-op Pain Management: Minimal or no pain anticipated   Induction: Intravenous  PONV Risk Score and Plan: 2 and Treatment may vary due to age or medical condition, Propofol  infusion and Ondansetron   Airway Management Planned: Natural Airway  and Simple Face Mask  Additional Equipment: None  Intra-op Plan:   Post-operative Plan:   Informed Consent: I have reviewed the patients History and Physical, chart, labs and discussed the procedure including the risks, benefits and alternatives for the proposed anesthesia with the patient or authorized representative who has indicated his/her understanding and acceptance.     Dental advisory given  Plan Discussed with: CRNA and Anesthesiologist  Anesthesia Plan Comments: (See PAT note written  11/20/2024 by Isaiah Ruder, PA-C. ESRD, CAD (most recent NSTEMI, s/p DES LAD for ISR 08/28/2024), afib (s/p Watchman), HFpEF, PAH (RV failure during 08/2024 admission), hypotension, anemia.   )         Anesthesia Quick Evaluation  "

## 2024-11-20 NOTE — Discharge Instructions (Addendum)
 Norleen Armor, MD [facility]  Please read the following information regarding your care after surgery.  Medications  You only need a prescription for the narcotic pain medicine (ex. oxycodone , Percocet, Norco).  All of the other medicines listed below are available over the counter. X acetominophen (Tylenol ) 650 mg every 4-6 hours as you need for minor to moderate pain X tramadol  as prescribed for severe pain  Weight Bearing X Bear weight only on your operated foot in the post-op shoe.  Cast / Splint / Dressing X Keep your splint, cast or dressing clean and dry.  Dont put anything (coat hanger, pencil, etc) down inside of it.  If it gets damp, use a hair dryer on the cool setting to dry it.  If it gets soaked, call the office to schedule an appointment for a cast change.  After your dressing, cast or splint is removed; you may shower, but do not soak or scrub the wound.  Allow the water  to run over it, and then gently pat it dry.  Swelling It is normal for you to have swelling where you had surgery.  To reduce swelling and pain, keep your toes above your nose for at least 3 days after surgery.  It may be necessary to keep your foot or leg elevated for several weeks.  If it hurts, it should be elevated.  Follow Up Call my office at [phone] when you are discharged from the hospital or surgery center to schedule an appointment to be seen two weeks after surgery.  Call my office at [phone] if you develop a fever >101.5 F, nausea, vomiting, bleeding from the surgical site or severe pain.

## 2024-11-20 NOTE — Op Note (Signed)
 11/20/2024  3:15 PM  PATIENT:  [name]  59 y.o. male  PRE-OPERATIVE DIAGNOSIS: Left hallux gangrene  POST-OPERATIVE DIAGNOSIS: Same  Procedures: Left hallux amputation  SURGEON:  Norleen Armor, MD  ASSISTANT: Dickey Sales, PA-C  ANESTHESIA:   MAC, local  EBL:  minimal   TOURNIQUET: Less than 15 minutes with an ankle Esmarch  COMPLICATIONS:  None apparent  DISPOSITION:  Extubated, awake and stable to recovery.  INDICATION FOR PROCEDURE: 59 year old male with complicated past medical history has developed a nonhealing left hallux ulcer with osteomyelitis.  The tip of the toe was gangrenous, and he presents today for left hallux amputation.  The risks and benefits of the alternative treatment options have been discussed in detail.  The patient wishes to proceed with surgery and specifically understands risks of bleeding, infection, nerve damage, blood clots, need for additional surgery, amputation and death.   PROCEDURE IN DETAIL:  After pre operative consent was obtained, and the correct operative site was identified, the patient was brought to the operating room and placed supine on the OR table.  Anesthesia was administered.  Pre-operative antibiotics were administered.  A surgical timeout was taken.  The left hallux was anesthetized with a metatarsal block of half percent Marcaine  with epinephrine .  The left lower extremity was then prepped and draped in standard sterile fashion.  The foot was exsanguinated and a 4 inch Esmarch tourniquet wrapped around the ankle.  A fishmouth incision was marked on the skin proximal to the nail.  The incision was made and dissection carried down through the subcutaneous tissues.  The toe was disarticulated through the IP joint and passed off the field.  The oscillating saw was used to resect the distal end of the proximal phalanx.  The wound was irrigated copiously.  The remaining soft tissues appeared healthy.  Bleeding vessels were cauterized.   The incision was closed with simple and horizontal mattress sutures of 2-0 nylon.  Sterile dressings were applied followed by a compression wrap.  The tourniquet was released after application of the dressings.  The patient was awakened from anesthesia and transported to the recovery room in stable condition.   FOLLOW UP PLAN: Weightbearing as tolerated in a flat postop shoe.  The patient will continue Plavix  and aspirin .  Follow-up in the office in 3 weeks for suture removal.    Eva Barrack PA-C was present and scrubbed for the duration of the operative case. His assistance was essential in positioning the patient, prepping and draping, gaining and maintaining exposure, performing the operation, closing and dressing the wounds and applying the splint.

## 2024-11-20 NOTE — Progress Notes (Signed)
 Orthopedic Tech Progress Note Patient Details:  [name] [birth year] [id number]  Ortho Devices Type of Ortho Device: Postop shoe/boot Ortho Device/Splint Location: LLE Ortho Device/Splint Interventions: Application, AdjustmentPACU RN called requesting a POST OP SHOE for patient    Post Interventions Patient Tolerated: Well Instructions Provided: Care of device  Delanna LITTIE Pac 11/20/2024, 4:06 PM

## 2024-11-20 NOTE — Transfer of Care (Signed)
 Immediate Anesthesia Transfer of Care Note  Patient: [name]  Procedure(s) Performed: AMPUTATION, TOE (Left: Toe)  Patient Location: PACU  Anesthesia Type:MAC  Level of Consciousness: awake, alert , oriented, drowsy, and patient cooperative  Airway & Oxygen Therapy: Patient Spontanous Breathing and Patient connected to face mask oxygen  Post-op Assessment: Report given to RN and Post -op Vital signs reviewed and stable  Post vital signs: Reviewed and stable  Last Vitals:  Vitals Value Taken Time  BP 105/59 11/20/24 15:17  Temp    Pulse 81 11/20/24 15:18  Resp 20 11/20/24 15:19  SpO2 100 % 11/20/24 15:18  Vitals shown include unfiled device data.  Last Pain:  Vitals:   11/20/24 1209  TempSrc:   PainSc: 4       Patients Stated Pain Goal: 1 (11/20/24 1209)  Complications: No notable events documented.

## 2024-11-21 ENCOUNTER — Encounter (HOSPITAL_COMMUNITY): Payer: Self-pay | Admitting: Orthopedic Surgery

## 2024-11-21 LAB — POCT I-STAT, CHEM 8
BUN: 38 mg/dL — ABNORMAL HIGH (ref 6–20)
Calcium, Ion: 0.9 mmol/L — ABNORMAL LOW (ref 1.15–1.40)
Chloride: 97 mmol/L — ABNORMAL LOW (ref 98–111)
Creatinine, Ser: 8.3 mg/dL — ABNORMAL HIGH (ref 0.61–1.24)
Glucose, Bld: 74 mg/dL (ref 70–99)
HCT: 33 % — ABNORMAL LOW (ref 39.0–52.0)
Hemoglobin: 11.2 g/dL — ABNORMAL LOW (ref 13.0–17.0)
Potassium: 3.8 mmol/L (ref 3.5–5.1)
Sodium: 138 mmol/L (ref 135–145)
TCO2: 28 mmol/L (ref 22–32)

## 2024-11-22 ENCOUNTER — Encounter: Payer: Self-pay | Admitting: Plastic Surgery

## 2024-11-22 ENCOUNTER — Ambulatory Visit: Admitting: Plastic Surgery

## 2024-11-22 DIAGNOSIS — Z89431 Acquired absence of right foot: Secondary | ICD-10-CM

## 2024-11-22 DIAGNOSIS — S91301D Unspecified open wound, right foot, subsequent encounter: Secondary | ICD-10-CM | POA: Diagnosis not present

## 2024-11-22 NOTE — Progress Notes (Signed)
 Mr. Stavros returns today for evaluation of the right foot and transmet amputation sites.  He states he is doing well his primary concern at this point is the heel on the right foot.  On evaluation all the wounds are clean and slowly healing.  The heel is deeper than it was last time but appears to be clean.  Wounds were cleaned and dressings replaced without difficulty.  Will order supplies as needed for him.  Follow-up in 2 to 3 weeks.

## 2024-11-26 ENCOUNTER — Telehealth: Payer: Self-pay

## 2024-11-26 NOTE — Telephone Encounter (Signed)
 Faxed the [employer]  form requesting wound care supplies on 11/23/24 with confirmed receipt.

## 2024-12-01 NOTE — Progress Notes (Signed)
 Patient: [name] DOB: [birth year] MRN: [id number] Note Type: Dialysis Rounds-Comp Service Date: 11/28/2024  This patient was personally seen face-to-face for a complete visit as part of routine monthly dialysis care for end stage renal disease.  Attending Nephrologist: WATT TAMRA SAILOR Dialysis Location: [facility] Schedule: M-W-F Shift: 1  ------------------------------ OVERVIEW ------------------------------  COMMENTS: I have seen and spoken with patient today during basic dialysis rounds today. Patient denies any concerns at this time Patients BP 128/112, P-69. Patient denies chest pain, SOB, cramping, and edema. Patient reports appointment with orthopedics for follow-ups about R-foot went well, and they stated to continue dressing changes, he has a necrotic area to the tip of his L-great toe that he has an MRI scheduled for on 12/28. Patients L-AVF working well, patients KT/V 1.3. Patient denies any further concerns at this time. Left AVF in use for dialysis, no access issues reported, Kt/v 1.5 he complains of left hand being cold, denies any numbness or bleeding , advised warm glove, staff advised closely monitor for now, may need referral to [facility], has he has not been seen in >54yr.   PATIENT HISTORY COMMENTS: Recent hx PCI w/stent x3 , at [facility] 07/05/23 , now on Metoprolol  25 BID , Plavix  75qd, he has completed cardiac rehab w/ [name]  ------------------------------ HOME MEDICATIONS ------------------------------  Current [facility] Outpatient Medications ------------------------------------------ tadalafil (Cialis) 10 MG tablet Take 2 tablets (20 mg total) by mouth 1 (one) time each day if needed for erectile dysfunction Start Date: 02/04/2021  Current [facility] Allergies ----------------------------- Allergen: Not on File  ------------------------------ DIALYSIS PRESCRIPTION ------------------------------  COMMENTS: no changes,  fluid restrictions advised  Treatment Data -------------- EDW (kg): 93  ------------------------------ BP AND FLUID ASSESSMENT ------------------------------  COMMENTS: takes Midodrine    ------------------------------ ACCESS ASSESSMENT ------------------------------  Current Access: AVF  COMMENTS: Left AVF- in use for dialysis, no access issues reported, good adequacy kt/v 1.5  ------------------------------ TRANSPLANT STATUS COMMENT ------------------------------  COMMENTS: Currently is inactive list for transplant at [name].  ------------------------------ PHYSICAL EXAM ------------------------------  Exam performed.  Vital Signs Reviewed. CV - Blood pressure noted. No edema. EXT - Foot ulcers.  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 pt seeing a new cardiologist and starting amiodarone , getting LFT and Thyroid func periodically for the cardiologist. Calcium  is gone down to 6.6, temporarily increase the Tums to 2 at nighttime. Having issues of cramping, increase target weight to 95. Has had pretransplant evaluation follow-up again yesterday and awaiting to become active based upon their monthly meeting soon. Phosphorus 5.6 on Auryxia  2 to 3 with each meal. On midodrine  for intradialytic hypotension. Off cardiac rehab, exercise tolerance much better .URR 72, potassium 4.2, hemoglobin 12.7  03/28/2024: Patient seen and spoke to during basic rounds. Labs reviewed, Auryxia  2 with each meal, phosphorus 5.8. Patient has missed some binders. Compliance reinforced. Calcium  6.8 recommend to take 2 Tums at nighttime. PTH pending. Hemoglobin 13.1, albumin  3.8 and potassium 4.3.  04/23/2024:  Patient seen and spoke to during comprehensive rounds. Monthly labs so far pending. Last phosphorus was 5.8 and  PTH was 49 in May. Awaiting final cardiac imaging and possibly CT of the abdomen to be placed back on the transplant list.  6.16.25: Stable  6.25.25: Stable  6.27.25: Patient seen And spoke to during comprehensive rounds. Vital signs and vol assessment reviewed. Stable, no new c/o  05/16/24 :  Pt seen and  spoke to during comp rounds. small skin breakdown right great toe. started on doxy 2 days ago. apt with podiatry pending. labs pending. is on auryxia  , midodrine  and amiodarone   06/13/24 :  Pt seen and spoke to during basic rounds. Has had a spot on the left foot seeing [facility] in [place]  seen them yesterday. Calcium  has improved from 6.3 to 7.8 and he takes two at night time and hes on a higher calcium  bath. Is on inactive transplant list waiting for stress test again  06/20/2024 :  Patient seen and spoke to during comprehensive rounds. All labs from July reviewed. Target weight, blood pressures reviewed as well. Monthly labs from August pending. Access working ok. left foot wound, following with wound care.  07/06/2024:  Patient seen and spoke to during basic rounds. Dialysis treatment stable. [place] the [place]. doing ok so far   07/11/2024 :  Patient seen and spoke to during basic rounds. Overall stable. some symptoms of heartburn, he is on Pepcid , recommend to restart Protonix  as well. Has stress test scheduled at [facility] tomorrow.  07/25/2024:  Patient seen and spoke to during comprehensive rounds. Has the toenail in his left great toe has come off from the base and there appears to be some drainage yellowish beneath that as well, given his previous history of calciphylaxis and foot lesions, will send out 100 mg twice daily of doxycycline  for 10 days. Patient has appointment with podiatry in the ED tomorrow as well.  08/03/2024 :  Pt seen and spoke to during basic rounds. BP and Wt reviewed. overall stable., no new issues , toe nail removed by  podiatrist. finishing up Doxy. Txp listing at [name] delayed due to Insurance issues .  08/08/2024 :  Patient successfully seen and spoke to during basic rounds. Labs reviewed. low Ca, not gotten calcitriol  at home For now, recommend to take calcitriol  1 mcg q. treatment in center and continue Tums twice a day. PTH 50, phosphorus 4.1, hemoglobin 10.4  08/20/2024:  Patient seen and spoke to during basic rounds. Labs being drawn today. was in hosp at [facility] with right foot inf, was discharged with Ertapenem  with a midline right line. planning to see Ortho tomorrow with [facility].  08/22/2024:  Patient seen and spoke to during comprehensive rounds with the nurse new nurse practitioner. Vital signs reviewed. Increase target weight to 94. October labs pending. was in hosp at [facility] with right foot inf, was discharged with Ertapenem  with a midline right line To continue for another week, planning to see Ortho today with [facility].  09/19/2024:  Patient seen and spoke to during basic rounds. Reviewed recent hospitalization, has had forefoot amputation has appointment today with Dr. Kit and Dareen. Finished antibiotics. Make appointment with cardiology at [facility].  09/28/2024:  Patient seen and spoke to during comprehensive rounds. Weights and blood pressures reviewed. Has a dressing on the right forefoot amputation. Has appointment with Dr. Judeen next week. Hemoglobin 7.6, increase Mircera to 150. Phosphorus 4.3, potassium 4.1. Follow-up with referral to Dr. Ladona at [facility] cardiology which was done last week.  10/17/2024 :  Patient seen and spoke to during comprehensive rounds Blood pressure weights and all labs reviewed. December labs pending. Compliance reinforced, cardiology appt still pending. today seen by Plastic surg Dr Leonce Birmingham at [facility]  11/28/2024:  Patient seen and spoke to during comprehensive rounds. Has had left foot surgery by Dr.  Kit recently has dressings  on. He is on Tums 2 with each meal as a binder, not on Auryxia . Potassium 4.7, phosphorus 4.3, PTH 32 and calcium  was 7.8 in December.   Signed by: WATT TAMRA SAILOR, MD on 12/01/2024 at 08:43:59 PM Transcribed by: WATT TAMRA SAILOR, MD on 12/01/2024 at 08:43:59 PM

## 2024-12-06 ENCOUNTER — Ambulatory Visit: Admitting: Plastic Surgery

## 2024-12-06 ENCOUNTER — Encounter: Payer: Self-pay | Admitting: Plastic Surgery

## 2024-12-06 VITALS — HR 81

## 2024-12-06 DIAGNOSIS — Z89431 Acquired absence of right foot: Secondary | ICD-10-CM

## 2024-12-06 DIAGNOSIS — S91301D Unspecified open wound, right foot, subsequent encounter: Secondary | ICD-10-CM | POA: Diagnosis not present

## 2024-12-06 NOTE — Progress Notes (Signed)
 [name] returns today for evaluation of the wounds on his right foot.  He is actually had a fairly remarkable improvement in the wounds with healing in all of them and significant decrease in size especially across the transmet wounds.  Dressings were changed.  Will continue with the Adaptic surgical lube and Vashe dressings to be changed daily.  Will follow-up in 4 weeks.  I spent 30 minutes examining the patient, providing wound care, and documenting.

## 2024-12-26 ENCOUNTER — Ambulatory Visit: Payer: Self-pay | Admitting: Student

## 2025-01-08 ENCOUNTER — Ambulatory Visit: Admitting: Physician Assistant

## 2025-01-10 ENCOUNTER — Ambulatory Visit: Admitting: Plastic Surgery
